# Patient Record
Sex: Female | Born: 1965 | Race: White | Hispanic: No
Health system: Western US, Academic
[De-identification: ages and names within clinical notes are randomized; demographics above are authoritative.]

## PROBLEM LIST (undated history)

## (undated) ENCOUNTER — Ambulatory Visit (HOSPITAL_COMMUNITY): Admission: EM | Payer: Medicare Other

## (undated) DIAGNOSIS — A609 Anogenital herpesviral infection, unspecified: Secondary | ICD-10-CM

## (undated) DIAGNOSIS — R87610 Atypical squamous cells of undetermined significance on cytologic smear of cervix (ASC-US): Secondary | ICD-10-CM

## (undated) DIAGNOSIS — IMO0002 Reserved for concepts with insufficient information to code with codable children: Secondary | ICD-10-CM

## (undated) DIAGNOSIS — M1990 Unspecified osteoarthritis, unspecified site: Secondary | ICD-10-CM

## (undated) DIAGNOSIS — B977 Papillomavirus as the cause of diseases classified elsewhere: Secondary | ICD-10-CM

## (undated) DIAGNOSIS — M4800 Spinal stenosis, site unspecified: Secondary | ICD-10-CM

## (undated) DIAGNOSIS — M069 Rheumatoid arthritis, unspecified: Secondary | ICD-10-CM

## (undated) DIAGNOSIS — M542 Cervicalgia: Secondary | ICD-10-CM

## (undated) DIAGNOSIS — R32 Unspecified urinary incontinence: Secondary | ICD-10-CM

## (undated) DIAGNOSIS — M2352 Chronic instability of knee, left knee: Secondary | ICD-10-CM

## (undated) DIAGNOSIS — K5790 Diverticulosis of intestine, part unspecified, without perforation or abscess without bleeding: Secondary | ICD-10-CM

## (undated) DIAGNOSIS — T148XXA Other injury of unspecified body region, initial encounter: Secondary | ICD-10-CM

## (undated) DIAGNOSIS — Z8739 Personal history of other diseases of the musculoskeletal system and connective tissue: Secondary | ICD-10-CM

## (undated) DIAGNOSIS — A6000 Herpesviral infection of urogenital system, unspecified: Secondary | ICD-10-CM

## (undated) DIAGNOSIS — M5090 Cervical disc disorder, unspecified, unspecified cervical region: Secondary | ICD-10-CM

## (undated) DIAGNOSIS — K089 Disorder of teeth and supporting structures, unspecified: Secondary | ICD-10-CM

## (undated) DIAGNOSIS — J302 Other seasonal allergic rhinitis: Secondary | ICD-10-CM

## (undated) DIAGNOSIS — J45909 Unspecified asthma, uncomplicated: Secondary | ICD-10-CM

## (undated) DIAGNOSIS — K529 Noninfective gastroenteritis and colitis, unspecified: Secondary | ICD-10-CM

## (undated) DIAGNOSIS — M792 Neuralgia and neuritis, unspecified: Secondary | ICD-10-CM

## (undated) DIAGNOSIS — T753XXA Motion sickness, initial encounter: Secondary | ICD-10-CM

## (undated) HISTORY — PX: SPINAL FUSION: SHX5113

## (undated) HISTORY — DX: Neuralgia and neuritis, unspecified: M79.2

## (undated) HISTORY — PX: SURGICAL HX OTHER: 99

## (undated) HISTORY — DX: Rheumatoid arthritis, unspecified: M06.9

## (undated) HISTORY — PX: PR ADENOIDECTOMY PRIMARY <AGE 12: 42830

## (undated) HISTORY — DX: Cervical disc disorder, unspecified, unspecified cervical region: M50.90

## (undated) HISTORY — DX: Cervicalgia: M54.2

## (undated) HISTORY — PX: PR UNLISTED PROCEDURE FEMUR/KNEE: 27599

## (undated) HISTORY — DX: Other injury of unspecified body region, initial encounter: T14.8XXA

## (undated) HISTORY — PX: JOINT REPLACEMENT: SHX5216

## (undated) HISTORY — PX: PR TONSILLECTOMY ONE-HALF <AGE 12: 42825

## (undated) HISTORY — DX: Diverticulosis of intestine, part unspecified, without perforation or abscess without bleeding: K57.90

## (undated) HISTORY — DX: Chronic instability of knee, left knee: M23.52

## (undated) HISTORY — PX: EPIDURAL BLOCK INJECTION: SHX5085

## (undated) HISTORY — PX: KNEE ARTHROPLASTY: SHX992

## (undated) HISTORY — DX: Noninfective gastroenteritis and colitis, unspecified: K52.9

## (undated) HISTORY — DX: Motion sickness, initial encounter: T75.3XXA

## (undated) HISTORY — DX: Other seasonal allergic rhinitis: J30.2

## (undated) HISTORY — PX: PR UNLISTED PROCEDURE NECK/THORAX: 21899

## (undated) HISTORY — DX: Unspecified urinary incontinence: R32

## (undated) HISTORY — DX: Disorder of teeth and supporting structures, unspecified: K08.9

## (undated) HISTORY — PX: PR UNLISTED PROCEDURE PELVIS/HIP JOINT: 27299

## (undated) HISTORY — DX: Personal history of other diseases of the musculoskeletal system and connective tissue: Z87.39

## (undated) HISTORY — PX: PR UNLISTED PROCEDURE FOOT/TOES: 28899

## (undated) HISTORY — PX: LAMINECTOMY: SHX5029

## (undated) HISTORY — PX: CERVICAL SPINE SURGERY: SHX589

## (undated) HISTORY — DX: Reserved for concepts with insufficient information to code with codable children: IMO0002

## (undated) HISTORY — DX: Anogenital herpesviral infection, unspecified: A60.9

## (undated) HISTORY — DX: Papillomavirus as the cause of diseases classified elsewhere: B97.7

## (undated) HISTORY — DX: Atypical squamous cells of undetermined significance on cytologic smear of cervix (ASC-US): R87.610

## (undated) HISTORY — PX: OTHER SURGICAL HISTORY: SHX169

## (undated) HISTORY — DX: Spinal stenosis, site unspecified: M48.00

## (undated) HISTORY — PX: TONSILLECTOMY: SUR1361

## (undated) MED ORDER — TIZANIDINE HCL 2 MG OR TABS
2.0000 mg | ORAL_TABLET | Freq: Three times a day (TID) | ORAL | 0 refills | Status: AC
Start: 2023-04-08 — End: ?

## (undated) MED ORDER — OXYCODONE HCL 5 MG OR TABS
5.0000 mg | ORAL_TABLET | Freq: Four times a day (QID) | ORAL | 0 refills | Status: AC | PRN
Start: 2023-04-08 — End: 2023-04-18

## (undated) MED ORDER — DULOXETINE HCL 30 MG OR CPEP
DELAYED_RELEASE_CAPSULE | ORAL | 0 refills | Status: AC
Start: 2023-01-04 — End: ?

## (undated) MED ORDER — LEFLUNOMIDE 20 MG OR TABS
ORAL_TABLET | ORAL | 3 refills | Status: AC
Start: 2023-04-21 — End: ?

## (undated) MED FILL — Tranexamic Acid-Sodium Chloride IV Soln 1000 MG/100ML-0.7%: INTRAVENOUS | Qty: 100 | Status: AC

---

## 1998-03-27 ENCOUNTER — Encounter: Admission: RE | Admit: 1998-03-27 | Discharge: 1998-06-25 | Payer: Self-pay | Admitting: Gynecology

## 1998-09-02 ENCOUNTER — Inpatient Hospital Stay (HOSPITAL_COMMUNITY): Admission: AD | Admit: 1998-09-02 | Discharge: 1998-09-02 | Payer: Self-pay | Admitting: Gynecology

## 1998-09-03 ENCOUNTER — Inpatient Hospital Stay (HOSPITAL_COMMUNITY): Admission: AD | Admit: 1998-09-03 | Discharge: 1998-09-05 | Payer: Self-pay | Admitting: Gynecology

## 1998-10-04 ENCOUNTER — Other Ambulatory Visit: Admission: RE | Admit: 1998-10-04 | Discharge: 1998-10-04 | Payer: Self-pay | Admitting: Gynecology

## 1999-10-05 ENCOUNTER — Other Ambulatory Visit: Admission: RE | Admit: 1999-10-05 | Discharge: 1999-10-05 | Payer: Self-pay | Admitting: Gynecology

## 2006-03-22 ENCOUNTER — Other Ambulatory Visit: Admission: RE | Admit: 2006-03-22 | Discharge: 2006-03-22 | Payer: Self-pay | Admitting: Family Medicine

## 2007-03-29 ENCOUNTER — Other Ambulatory Visit: Admission: RE | Admit: 2007-03-29 | Discharge: 2007-03-29 | Payer: Self-pay | Admitting: Family Medicine

## 2008-04-16 ENCOUNTER — Other Ambulatory Visit: Admission: RE | Admit: 2008-04-16 | Discharge: 2008-04-16 | Payer: Self-pay | Admitting: Family Medicine

## 2008-09-18 ENCOUNTER — Ambulatory Visit (HOSPITAL_BASED_OUTPATIENT_CLINIC_OR_DEPARTMENT_OTHER): Admission: RE | Admit: 2008-09-18 | Discharge: 2008-09-19 | Payer: Self-pay | Admitting: Orthopedic Surgery

## 2008-11-17 ENCOUNTER — Emergency Department (HOSPITAL_COMMUNITY): Admission: EM | Admit: 2008-11-17 | Discharge: 2008-11-17 | Payer: Self-pay | Admitting: Emergency Medicine

## 2011-02-02 LAB — BASIC METABOLIC PANEL
BUN: 9 mg/dL (ref 6–23)
CO2: 26 meq/L (ref 19–32)
Calcium: 9 mg/dL (ref 8.4–10.5)
Chloride: 105 meq/L (ref 96–112)
Creatinine, Ser: 0.64 mg/dL (ref 0.4–1.2)
GFR calc Af Amer: >60 mL/min (ref >60)
GFR calc non Af Amer: >60 mL/min (ref >60)
Glucose, Bld: 80 mg/dL (ref 70–99)
Potassium: 3.9 mEq/L (ref 3.5–5.1)
Sodium: 139 meq/L (ref 135–145)

## 2011-02-02 LAB — DIFFERENTIAL
Basophils Absolute: 0 10*3/uL (ref 0.0–0.1)
Basophils Relative: 0 % (ref 0–1)
Eosinophils Absolute: 0.3 10*3/uL (ref 0.0–0.7)
Eosinophils Relative: 3 % (ref 0–5)
Lymphocytes Relative: 24 % (ref 12–46)
Lymphs Abs: 2.6 10*3/uL (ref 0.7–4.0)
Monocytes Absolute: 0.5 10*3/uL (ref 0.1–1.0)
Monocytes Relative: 4 % (ref 3–12)
Neutro Abs: 7.3 10*3/uL (ref 1.7–7.7)
Neutrophils Relative %: 68 % (ref 43–77)

## 2011-02-02 LAB — CBC
HCT: 44.5 % (ref 36.0–46.0)
Hemoglobin: 14.6 g/dL (ref 12.0–15.0)
MCHC: 32.9 g/dL (ref 30.0–36.0)
MCV: 94.2 fL (ref 78.0–100.0)
Platelets: 295 10*3/uL (ref 150–400)
RBC: 4.72 MIL/uL (ref 3.87–5.11)
RDW: 13.2 % (ref 11.5–15.5)
WBC: 10.7 10*3/uL — ABNORMAL HIGH (ref 4.0–10.5)

## 2011-03-03 NOTE — Op Note (Signed)
Caitlin Wagner, Caitlin Wagner              ACCOUNT NO.:  [id number]   MEDICAL RECORD NO.:  [id number]          PATIENT TYPE:  AMB   LOCATION:  DSC                          FACILITY:  [facility]   PHYSICIAN:  Leonides Grills, M.D.     DATE OF BIRTH:  [birth year]   DATE OF PROCEDURE:  09/18/2008  DATE OF DISCHARGE:                               OPERATIVE REPORT   PREOPERATIVE DIAGNOSES:  1. Right hypermobile first ray with rheumatoid arthritis.  2. Right hallux valgus.  3. Right bunion.  4. Right tight gastroc.  5. Right second through fifth metatarsalgia.  6. Right second through fifth hammertoes.   POSTOPERATIVE DIAGNOSES:  1. Right hypermobile first ray with rheumatoid arthritis.  2. Right hallux valgus.  3. Right bunion.  4. Right tight gastroc.  5. Right second through fifth metatarsalgia.  6. Right second through fifth hammertoes.   OPERATION:  1. Right first tarsometatarsal joint fusion with osteotomy.  2. Right local bone graft.  3. Right-modified McBride bunionectomy.  4. Right great toe digital nerve neurolysis.  5. Stress x-rays right foot.  6. Right gastroc slide.  7. Right varus fifth metatarsal osteotomy.  8. Right second through fourth Weil cap shortening osteotomies.  9. Right second through fifth toes metatarsophalangeal joint dorsal      capsulotomies with collateral release.  10.Right second toe flexor digitorum longus to proximal phalanx tendon      transfer.  11.Right second through fourth toes extensor digitorum brevis to      extensor digitorum longus tendon transfer.  12.Right fifth toe extensor digitorum longus Z-lengthening.   ANESTHESIA:  General.   SURGEON:  Leonides Grills, MD   ASSISTANT:  Richardean Canal, PA-C   ESTIMATED BLOOD LOSS:  Minimal.   TOURNIQUET TIME:  2 hours.   COMPLICATIONS:  None.   DISPOSITION:  Stable to PR.   INDICATIONS:  This is a 45 year old female who had rheumatoid arthritis  with the above painful deformity and pathology.  She  was consented to  the above procedure.  All risks of infection, neurovascular injury,  nonunion, malunion, hardware rotation, hardware failure, persistent  pain, worsening pain, prolonged recovery, stiffness, arthritis,  recurrence of hallux valgus, development of hallux varus, cock-up toe  deformity of lesser toes, and recurrence of dislocation of the MTP  joints as well as the hammertoe deformity and possibility of metatarsal  head resection in the future were all explained.  Questions were  encouraged and answered.   OPERATION:  The patient was brought to the operating room and placed in  supine position.  After adequate general endotracheal anesthesia  administered as well as Ancef 1 g IV piggyback, right lower extremity  was then prepped and draped in the sterile manner over proximally thigh  tourniquet.  Limb was gravity exsanguinated.  Tourniquet was elevated to  290 mmHg.  A longitudinal incision midline over the medial aspect the  right great toe MTP joint was then made.  Dissection was carried down  through the skin.  Hemostasis was obtained.  Great toe digital nerve and  dorsal medial nerve was carefully dissected out.  A formal digital nerve  neurolysis was then performed.  Once this retracted out of harm's way, L-  shaped capsulotomy was then made.  Simple bunionectomy was then  performed with a sagittal saw.  Rocky Link Johnson's ridge was then rounded off  with a rongeur and area was copiously irrigated with normal saline.  Lateral capsule was then released with curved Beaver blade protecting  the cartilaginous surface with a Therapist, nutritional.  This had outstanding  release of the tight capsule.  We then made a longitudinal incision over  the dorsal aspect of the right first TMP joint midline between EHL and  EHB.  Dissection was carried down through the skin.  Hemostasis was  obtained.  EHL and EHB tendons were protected within their sheaths and  interval between these tendons was  then developed.  The superficial  peroneal nerve was identified and careful superficial peroneal nerve  neurolysis was then performed.  This was also retracted out of harm's  way throughout the procedure.  First TMT joint was then entered.  Then  with a sagittal saw, a closing wedge base of the medial cuneiform  osteotomy was then made using a sagittal saw.  This was then placed on  the back table as local bone graft.  The base of first metatarsal was  also osteotomized creating a flat surface to appose the medial cuneiform  surface.  The remaining cartilage was removed with the curved 0.25-inch  osteotome as well as curette.  Multiple 2-mm drill holes were placed on  either side of the joint.  Then with a 2-point reduction clamp, we then  reduced the first TMT joint, visualized this under C-arm guidance to be  in excellent position.  We then provisionally fixed this with a 2-mm K-  wire from the medial cuneiform to the base of first metatarsal extending  sheathing laterally.  We then used a bur to create a notch into the base  of first metatarsal about 2 cm distal to the first TMP joint.  We then  placed a 3.5-mm fully-threaded cortical lag screw using a 3.5 followed  by 2.5-mm drill hole respectively.  This had excellent purchase and  maintenance of the compression.  With the reduction clamp still in  place, K-wire was removed and this with this was replaced with a 3.5-mm  fully-threaded cortical lag screw using a 3.5 and 2.5-mm hole  respectively.  This had excellent purchase and compression across the  fusion site.  Stress x-rays were obtained in the AP and lateral planes  that showed no gross motion of fixation, proper position, and excellent  alignment as well.  We then went back to the great toe MTP joint,  completed the modified McBride bunionectomy by reconstructing the medial  capsule.  This was very loose.  This was reconstructed using 2-0 Vicryl  and this had an outstanding  repair.  We were able to reduce the  sesamoids as well.  We then made a longitudinal incision over the fifth  metatarsal.  Dissection was carried down through the skin.  Hemostasis  was obtained.  We then carefully dissected the soft tissues around the  shaft itself and then an oblique osteotomy was then created in the  shaft.  The metatarsal was moved into a varus position and then fixed  with two 2.0 mm fully threaded cortical lag screws using a 2.0 and 1.5-  mm hole respectively.  This had excellent purchase and maintenance of  the position.  We trimmed  off the redundant bone both medially and  laterally as well.  We then made a longitudinal incision over the dorsal  aspect of the second toe.  Dissection was carried down through the skin.  Hemostasis was obtained.  EDB and EDL tendons were identified.  The EDL  tendon was tenotomized proximally and medially and the brevis distal  lateral was retracted out of harm's way for later transfer.  The distal  aspect of proximal phalanx was then skeletonized.  Then the head was  removed with a rongeur followed by a bone cutter.  This cut was made  perpendicular to the long axis of the proximal phalanx.  A dorsal  capsulotomy and collateral release were then performed of the MTP joint,  protecting the soft tissues both medially and laterally.  Once this was  done, we then plantarflexed the toe through the MTP joint and then  performed a Weil cup metatarsal osteotomy in a plane that was parallel  to the plantar aspect of the foot.  This was then shortened about 6 mm  or so and then fixed with a 1.5-mm fully-threaded cortical set screw  using 1.1-mm drill hole respectively.  This had excellent purchase and  maintenance of the reduction.  The redundant bone ledge dorsally was  trimmed off with a rongeur and the area was copiously irrigated with  normal saline.  Bone wax was applied to the exposed bone surfaces.  The  second MTP joint was the only  joint that was completely dislocated.  The  remaining joints were not dislocated and we then made a longitudinal  incision through the plantar plate.  FPL tendon was then identified and  tenotomized distal as possible.  We then placed a drill hole through the  proximal phalanx using a 2.5 followed by a 3.5-mm drill hole.  We then  placed the tendon through the drill from plantar to dorsal transferring  the FPL tendon to the proximal phalanx.  This was then repaired with 4-0  PDS stitch.  We then placed a 0.045 double-ended trocar K-wire antegrade  through the middle and distal phalanx, reduced the PIP joint and with  tension on the FPL tendon through the drill hole and the MTP joint held  in reduced position.  The K-wire was fired retrograde across the MTP  joint.  This held the toe in an excellent position and had excellent  tension on the FPL tendon.  We then transferred the EDB to EDL dorsally  using 4-0 PDS stitch and sewed this to the stump of the FPL tendon as  well.  This had an Conservation officer, historic buildings. We did the exact same procedure  for the third and fourth toes respectively except due to the fact that  the toes were located and after the Weil osteotomy, the shock test  revealed that the toe was stable.  We did not perform an FPL to proximal  phalanx tendon transfer.  For the fifth toe again through a separate  incision dorsally, we did the exact same procedure as the third and  fourth toes as well except we did not perform an EDB to EDL tendon  transfer.  We did EDL Z-lengthening and that was repaired with 4-0 PDS  stitch.  Local bone graft that was obtained throughout the procedure  from the metatarsal heads as well as from drill bits and the osteotomies  was packed into the first TMP joint using a stress and strain relieving  technique as described by Perren.  Tourniquet was deflated prior to this  finishing the case and hemostasis was obtained.  There was no pulsatile  bleeding.   Toes all pinked up nicely.  Skin-relieving incisions were  made on either side of the K-wire.  K-wires were bent, cut, and capped.  Final stress x-rays in the AP and lateral planes showed no gross motion  of fixation, proper position, and excellent alignment as well.  All  screws were of proper length.  We copiously irrigated the wounds with  normal saline.  Subcu was closed with 4-0 PDS.  Skin was closed with 4-0  nylon over all wounds.  Sterile dressing was applied.  Roger Mann  dressing was applied.  Modified Jones dressing was applied.  The patient  was stable to the PR.      Leonides Grills, M.D.  Electronically Signed     PB/MEDQ  D:  09/18/2008  T:  09/19/2008  Job:  [id number]

## 2011-03-03 NOTE — Op Note (Signed)
NAMEDEMITA, TOBIA              ACCOUNT NO.:  [id number]   MEDICAL RECORD NO.:  [id number]          PATIENT TYPE:  AMB   LOCATION:  DSC                          FACILITY:  [facility]   PHYSICIAN:  Leonides Grills, M.D.     DATE OF BIRTH:  1966/01/07   DATE OF PROCEDURE:  09/18/2008  DATE OF DISCHARGE:                               OPERATIVE REPORT   ADDENDUM   OPERATIVE NOTE:  We performed a gastroc slide.  We made a longitudinal  incision on the medial aspect of the gastrocnemius muscle tendinous  junction.  Dissection was carried down through the skin.  Hemostasis was  obtained.  Fascia was opened in line with the incision.  Conjoined  region was then developed between the gastroc and soleus musculatures.  A sural nerve was identified posteriorly and protected throughout the  case.  Gastrocnemius was then released with curved Mayo scissors.  This  had an excellent release, tight gastroc.  The area was copiously  irrigated with normal saline.  Subcu was closed with 3-0 Vicryl, skin  was closed with 4-0 Monocryl subcuticular stitch.  Steri-Strips were  applied.      Leonides Grills, M.D.  Electronically Signed     PB/MEDQ  D:  09/18/2008  T:  09/19/2008  Job:  [id number]

## 2011-07-24 LAB — POCT HEMOGLOBIN-HEMACUE: Hemoglobin: 15.3 g/dL — ABNORMAL HIGH (ref 12.0–15.0)

## 2012-03-19 DIAGNOSIS — B977 Papillomavirus as the cause of diseases classified elsewhere: Secondary | ICD-10-CM

## 2012-03-19 HISTORY — DX: Papillomavirus as the cause of diseases classified elsewhere: B97.7

## 2012-04-01 ENCOUNTER — Ambulatory Visit: Payer: Self-pay | Admitting: Gynecology

## 2012-04-12 ENCOUNTER — Encounter: Payer: Self-pay | Admitting: Gynecology

## 2012-04-12 ENCOUNTER — Other Ambulatory Visit (HOSPITAL_COMMUNITY)
Admission: RE | Admit: 2012-04-12 | Discharge: 2012-04-12 | Disposition: A | Discharge status: Home / Self Care | Payer: Medicare Other | Source: Ambulatory Visit | Attending: Gynecology | Admitting: Gynecology

## 2012-04-12 ENCOUNTER — Ambulatory Visit (INDEPENDENT_AMBULATORY_CARE_PROVIDER_SITE_OTHER): Payer: Medicare Other | Admitting: Gynecology

## 2012-04-12 VITALS — BP 116/60 | Ht 61.75 in | Wt 157.0 lb

## 2012-04-12 DIAGNOSIS — R87810 Cervical high risk human papillomavirus (HPV) DNA test positive: Secondary | ICD-10-CM | POA: Insufficient documentation

## 2012-04-12 DIAGNOSIS — R350 Frequency of micturition: Secondary | ICD-10-CM

## 2012-04-12 DIAGNOSIS — Z124 Encounter for screening for malignant neoplasm of cervix: Secondary | ICD-10-CM

## 2012-04-12 DIAGNOSIS — Z309 Encounter for contraceptive management, unspecified: Secondary | ICD-10-CM

## 2012-04-12 DIAGNOSIS — Z20828 Contact with and (suspected) exposure to other viral communicable diseases: Secondary | ICD-10-CM

## 2012-04-12 DIAGNOSIS — M069 Rheumatoid arthritis, unspecified: Secondary | ICD-10-CM | POA: Insufficient documentation

## 2012-04-12 LAB — URINALYSIS W MICROSCOPIC + REFLEX CULTURE
Bilirubin Urine: NEGATIVE
Glucose, UA: NEGATIVE mg/dL
Hgb urine dipstick: NEGATIVE
Ketones, ur: NEGATIVE mg/dL
Leukocytes, UA: NEGATIVE
Nitrite: NEGATIVE
Protein, ur: NEGATIVE mg/dL
Specific Gravity, Urine: <1.005 — ABNORMAL LOW (ref 1.005–1.030)
Urobilinogen, UA: 0.2 mg/dL (ref 0.0–1.0)
pH: 6 (ref 5.0–8.0)

## 2012-04-12 LAB — RPR

## 2012-04-12 NOTE — Patient Instructions (Signed)
Office will contact you with STD results. Follow up in one year for annual exam.

## 2012-04-12 NOTE — Progress Notes (Signed)
Caitlin Wagner [birth year] [id number]        46 y.o. G2 P2  Has not been in for a number of years being followed for rheumatoid arthritis. Several issues noted below.  Past medical history,surgical history, medications, allergies, family history and social history were all reviewed and documented in the EPIC chart. ROS:  Was performed and pertinent positives and negatives are included in the history.  Exam: Sherrilyn Rist assistant present Filed Vitals:   04/12/12 1049  BP: 116/60   General appearance  Normal Skin grossly normal Hands/feet with rheumatoid changes Head/Neck normal with no cervical or supraclavicular adenopathy thyroid normal Lungs  clear Cardiac RR, without RMG Abdominal  soft, nontender, without masses, organomegaly or hernia Breasts  examined lying and sitting without masses, retractions, discharge or axillary adenopathy. Pelvic  Ext/BUS/vagina  normal   Cervix  normal GC/Chlamydia, Pap/HPV  Uterus  anteverted, normal size, shape and contour, midline and mobile nontender   Adnexa  Without masses or tenderness    Anus and perineum  normal   Rectovaginal  normal sphincter tone without palpated masses or tenderness.    Assessment/Plan:  46 y.o. G2 P2 female regular menses.    1. STD screening. Patient relates prior relationship unfaithful and requests STD screening. GC/Chlamydia, HIV, RPR, hepatitis B, hepatitis C done. HSV/HPV issues reviewed and symptom reporting discussed. 2. Contraception. Patient not sexually active. She is 86 and smokes. Options to include barrier, progesterone only, IUD, sterilization reviewed.  Patient will follow up if she wants to pursue the above. 3. Pap smear. Pap/HPV done. No history of abnormal Pap smears but has not had one in a number of years. 4. Mammography. Recommended screening mammogram as she's never had one. SBE monthly reviewed. 5. Frequency. Patient has chronic frequency over the past year or so. No other symptoms. Urinalysis is  negative today as is her exam. I reviewed issues with caffeine/carbonated beverages that she does have a high intake. Possibilities for treatment such as OAB medications. We'll try decrease caffeine soda at present. 6. Health maintenance.  No other lab work was done as she reports having routine lab work done through her primary physician's office.    [name] MD, 11:25 AM 04/12/2012

## 2012-04-13 LAB — GC/CHLAMYDIA PROBE AMP, GENITAL
Chlamydia, DNA Probe: NEGATIVE
GC Probe Amp, Genital: NEGATIVE

## 2012-04-13 LAB — HEPATITIS B SURFACE ANTIGEN: Hepatitis B Surface Ag: NEGATIVE

## 2012-04-13 LAB — HIV ANTIBODY (ROUTINE TESTING W REFLEX): HIV: NONREACTIVE

## 2012-04-13 LAB — HEPATITIS C ANTIBODY: HCV Ab: NEGATIVE

## 2012-04-15 ENCOUNTER — Encounter: Payer: Self-pay | Admitting: Gynecology

## 2012-04-19 ENCOUNTER — Ambulatory Visit (INDEPENDENT_AMBULATORY_CARE_PROVIDER_SITE_OTHER): Payer: Medicare Other | Admitting: Gynecology

## 2012-04-19 ENCOUNTER — Encounter: Payer: Self-pay | Admitting: Gynecology

## 2012-04-19 DIAGNOSIS — R87612 Low grade squamous intraepithelial lesion on cytologic smear of cervix (LGSIL): Secondary | ICD-10-CM

## 2012-04-19 DIAGNOSIS — IMO0002 Reserved for concepts with insufficient information to code with codable children: Secondary | ICD-10-CM

## 2012-04-19 NOTE — Patient Instructions (Signed)
Office will call you with the biopsy results 

## 2012-04-19 NOTE — Progress Notes (Signed)
Patient ID: Caitlin Wagner, female   DOB: 03-Jan-1966, 46 y.o.   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 Biopsy of both areas taken and ECC performed. Patient tolerated well.   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 Acetowhite change at 12:00 off transformation zone and 6:00 at transformation zone was biopsied. ECC performed. I had a lengthy discussion with the patient about dysplasia, high-grade/low-grade, progression/regression in HPV Association. Patient will follow up for biopsy results. If low-grade and plan expected management with Pap/HPV cotesting one year.  If otherwise then we'll triage based on results.

## 2012-04-25 ENCOUNTER — Telehealth: Payer: Self-pay | Admitting: Gynecology

## 2012-04-25 NOTE — Telephone Encounter (Signed)
Tell patient that the biopsies are consistent with low grade changes. Recommend repeat Pap smear/HPV in 1 year.

## 2012-04-27 NOTE — Telephone Encounter (Signed)
Left message for pt to call.

## 2012-04-27 NOTE — Telephone Encounter (Signed)
Pt informed with the below note. 

## 2013-05-17 ENCOUNTER — Encounter: Payer: Self-pay | Admitting: Gynecology

## 2013-06-05 ENCOUNTER — Ambulatory Visit (INDEPENDENT_AMBULATORY_CARE_PROVIDER_SITE_OTHER): Payer: Medicare Other | Admitting: Gynecology

## 2013-06-05 ENCOUNTER — Encounter: Payer: Self-pay | Admitting: Gynecology

## 2013-06-05 ENCOUNTER — Other Ambulatory Visit (HOSPITAL_COMMUNITY)
Admission: RE | Admit: 2013-06-05 | Discharge: 2013-06-05 | Disposition: A | Discharge status: Home / Self Care | Payer: Medicare Other | Source: Ambulatory Visit | Attending: Gynecology | Admitting: Gynecology

## 2013-06-05 VITALS — BP 120/74 | Ht 61.0 in | Wt 160.0 lb

## 2013-06-05 DIAGNOSIS — R6889 Other general symptoms and signs: Secondary | ICD-10-CM

## 2013-06-05 DIAGNOSIS — IMO0002 Reserved for concepts with insufficient information to code with codable children: Secondary | ICD-10-CM

## 2013-06-05 DIAGNOSIS — Z1151 Encounter for screening for human papillomavirus (HPV): Secondary | ICD-10-CM | POA: Insufficient documentation

## 2013-06-05 DIAGNOSIS — Z309 Encounter for contraceptive management, unspecified: Secondary | ICD-10-CM

## 2013-06-05 DIAGNOSIS — Z01419 Encounter for gynecological examination (general) (routine) without abnormal findings: Secondary | ICD-10-CM | POA: Insufficient documentation

## 2013-06-05 DIAGNOSIS — Z9189 Other specified personal risk factors, not elsewhere classified: Secondary | ICD-10-CM

## 2013-06-05 NOTE — Progress Notes (Signed)
Caitlin Wagner June 21, 1966 [id number]        47 y.o.  [id number] for followup exam.  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 Colposcopic biopsy confirmed LGSIL and we planned expectant management.  Past medical history,surgical history, medications, allergies, family history and social history were all reviewed and documented in the EPIC chart.  ROS:  Performed and pertinent positives and negatives are included in the history, assessment and plan .  Exam: Kim assistant Filed Vitals:   06/05/13 1520  BP: 120/74  Height: 5\' 1"  (1.549 m)  Weight: 160 lb (72.576 kg)   General appearance  Normal Skin grossly normal Head/Neck normal with no cervical or supraclavicular adenopathy thyroid normal Lungs  clear Cardiac RR, without RMG Abdominal  soft, nontender, without masses, organomegaly or hernia Breasts  examined lying and sitting without masses, retractions, discharge or axillary adenopathy. Pelvic  Ext/BUS/vagina  normal   Cervix  normal Pap/HPV  Uterus  anteverted, normal size, shape and contour, midline and mobile nontender   Adnexa  Without masses or tenderness    Anus and perineum  normal   Rectovaginal  normal sphincter tone without palpated masses or tenderness.    Assessment/Plan:  47 y.o. [id number] female for followup exam.   1. 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 Pap/HPV today. We'll [name] based upon results. 2. Contraception. Patient's leaning towards Mirena IUD. We discussed contraceptive options. She is 47 and smokes and I do not think estrogen containing products wise. Patient will followup for IUD. She's currently not sexually active and has not been since her visit last year. 3. Mammography never. I strongly recommended screening mammography. I discussed the benefits of early detection. The patient agrees to schedule. SBE monthly reviewed. 4. Health  maintenance. Actively being followed for rheumatoid arthritis. No lab work done today as it is all done through her other physician's offices. Followup with contraceptive decision otherwise for Pap smear results.  Note: This document was prepared with digital dictation and possible smart phrase technology. Any transcriptional errors that result from this process are unintentional.   [name] MD, 3:51 PM 06/05/2013

## 2013-06-05 NOTE — Patient Instructions (Signed)
Call to Schedule your mammogram  Facilities in [place]: 1)  [facility], [address] Phone: [phone] 2)  [facility], [address] Phone: [phone] 3)  Dr. Bertrand at [facility]  [address] Phone: [phone]     Mammogram A mammogram is an X-ray test to find changes in a woman's breast. You should get a mammogram if:  You are 40 years of age or older  You have risk factors.   Your doctor recommends that you have one.  BEFORE THE TEST  Do not schedule the test the week before your period, especially if your breasts are sore during this time.  On the day of your mammogram:  Wash your breasts and armpits well. After washing, do not put on any deodorant or talcum powder on until after your test.   Eat and drink as you usually do.   Take your medicines as usual.   If you are diabetic and take insulin, make sure you:   Eat before coming for your test.   Take your insulin as usual.   If you cannot keep your appointment, call before the appointment to cancel. Schedule another appointment.  TEST  You will need to undress from the waist up. You will put on a hospital gown.   Your breast will be put on the mammogram machine, and it will press firmly on your breast with a piece of plastic called a compression paddle. This will make your breast flatter so that the machine can X-ray all parts of your breast.   Both breasts will be X-rayed. Each breast will be X-rayed from above and from the side. An X-ray might need to be taken again if the picture is not good enough.   The mammogram will last about 15 to 30 minutes.  AFTER THE TEST Finding out the results of your test Ask when your test results will be ready. Make sure you get your test results.  Document Released: 01/01/2009 Document Revised: 09/24/2011 Document Reviewed: 01/01/2009 [employer] Patient  Information 2012 [employer].   

## 2013-06-05 NOTE — Addendum Note (Signed)
Addended by: Dayna Barker on: 06/05/2013 04:19 PM   Modules accepted: Orders

## 2014-08-20 ENCOUNTER — Encounter: Payer: Self-pay | Admitting: Gynecology

## 2015-08-01 ENCOUNTER — Encounter: Payer: Self-pay | Admitting: Gynecology

## 2015-08-01 ENCOUNTER — Ambulatory Visit (INDEPENDENT_AMBULATORY_CARE_PROVIDER_SITE_OTHER): Payer: Medicare Other | Admitting: Gynecology

## 2015-08-01 VITALS — BP 116/76

## 2015-08-01 DIAGNOSIS — Z23 Encounter for immunization: Secondary | ICD-10-CM | POA: Diagnosis not present

## 2015-08-01 DIAGNOSIS — N760 Acute vaginitis: Secondary | ICD-10-CM

## 2015-08-01 DIAGNOSIS — B9689 Other specified bacterial agents as the cause of diseases classified elsewhere: Secondary | ICD-10-CM

## 2015-08-01 DIAGNOSIS — A499 Bacterial infection, unspecified: Secondary | ICD-10-CM

## 2015-08-01 LAB — WET PREP FOR TRICH, YEAST, CLUE
Trich, Wet Prep: NONE SEEN
Yeast Wet Prep HPF POC: NONE SEEN

## 2015-08-01 MED ORDER — CLINDAMYCIN PHOSPHATE 2 % VA CREA: 1.0000 | TOPICAL_CREAM | Freq: Every day | VAGINAL | Status: DC

## 2015-08-01 NOTE — Progress Notes (Signed)
YANICE MAQUEDA 1966-04-21 [id number]        49 y.o.  [id number] Presents complaining of 1-2 weeks of vaginal irritation and discharge. No real odor or significant itching. No dysuria urgency frequency. Has annual exam scheduled in December. History of LGSIL with positive HPV screen in the past with most recent Pap smear 2014 negative cytology and negative HPV screening  Past medical history,surgical history, problem list, medications, allergies, family history and social history were all reviewed and documented in the EPIC chart.  Directed ROS with pertinent positives and negatives documented in the history of present illness/assessment and plan.  Exam: Kim assistant Filed Vitals:   08/01/15 1434  BP: 116/76   General appearance:  Normal  external BUS vagina with white discharge. Cervix grossly normal. Uterus normal size midline mobile nontender. Adnexa without masses or tenderness.  Assessment/Plan:  49 y.o. [id number] with above history and exam.wet prep is suggestive of bacterial vaginosis. Will treat with Cleocin vaginal cream nightly 7 days. Follow up if symptoms persist, worsen or recur. Follow up at the scheduled annual exam appointment in December.    [name] MD, 2:57 PM 08/01/2015

## 2015-08-01 NOTE — Addendum Note (Signed)
Addended by: Dayna Barker on: 08/01/2015 03:41 PM   Modules accepted: Orders

## 2015-08-01 NOTE — Addendum Note (Signed)
Addended by: Dayna Barker on: 08/01/2015 03:13 PM   Modules accepted: Orders

## 2015-08-01 NOTE — Patient Instructions (Signed)
Use the vaginal cream nightly for 7 nights. Follow up if your symptoms persist, worsen or recur.  Follow up in December at your scheduled annual appointment exam.

## 2015-09-27 ENCOUNTER — Encounter: Payer: Self-pay | Admitting: Gynecology

## 2015-09-27 ENCOUNTER — Ambulatory Visit (INDEPENDENT_AMBULATORY_CARE_PROVIDER_SITE_OTHER): Payer: Medicare Other | Admitting: Gynecology

## 2015-09-27 VITALS — BP 118/76

## 2015-09-27 DIAGNOSIS — N760 Acute vaginitis: Secondary | ICD-10-CM

## 2015-09-27 LAB — WET PREP FOR TRICH, YEAST, CLUE
Clue Cells Wet Prep HPF POC: NONE SEEN
Trich, Wet Prep: NONE SEEN
Yeast Wet Prep HPF POC: NONE SEEN

## 2015-09-27 MED ORDER — FLUCONAZOLE 150 MG PO TABS: 150.0000 mg | ORAL_TABLET | Freq: Once | ORAL | Status: DC

## 2015-09-27 MED ORDER — METRONIDAZOLE 500 MG PO TABS: 500.0000 mg | ORAL_TABLET | Freq: Two times a day (BID) | ORAL | Status: DC

## 2015-09-27 NOTE — Addendum Note (Signed)
Addended by: Dayna Barker on: 09/27/2015 04:36 PM   Modules accepted: Orders

## 2015-09-27 NOTE — Progress Notes (Signed)
Caitlin Wagner 1966/04/04 [id number]        49 y.o.  [id number] Presents complaining of vaginal irritation. Similar complaints in October. Was treated with Cleocin vaginal cream with resolution of her symptoms but now they've returned. No odor or urinary symptoms such as frequency dysuria or urgency.  Past medical history,surgical history, problem list, medications, allergies, family history and social history were all reviewed and documented in the EPIC chart.  Directed ROS with pertinent positives and negatives documented in the history of present illness/assessment and plan.  Exam: Kim assistant Filed Vitals:   09/27/15 1419  BP: 118/76   General appearance:  Normal Abdomen soft nontender without masses guarding rebound Pelvic external BS vagina with whitish discharge.  Cervix normal. Uterus normal size midline mobile nontender. Adnexa without masses or tenderness.  Assessment/Plan:  49 y.o. [id number] with above history and exam. Wet prep is unremarkable. To cover her for both yeast and bacterial vaginosis. Flagyl 500 mg twice a day 7 days, alcohol avoidance reviewed. Diflucan 150 mg 1 tablet at the beginning and one at the end of the antibiotic course. Follow up if symptoms persist, worsen or recur.    [name] MD, 2:42 PM 09/27/2015

## 2015-09-27 NOTE — Patient Instructions (Signed)
Take the Flagyl medication twice daily for 7 days. Avoid alcohol while taking. Take the Diflucan medication 1 pill at the beginning of the oral antibiotics and 1 pill at the end.  Follow up if symptoms persist worsen or recur.

## 2015-10-09 ENCOUNTER — Encounter: Payer: Self-pay | Admitting: Gynecology

## 2015-10-09 ENCOUNTER — Ambulatory Visit (INDEPENDENT_AMBULATORY_CARE_PROVIDER_SITE_OTHER): Payer: Medicare Other | Admitting: Gynecology

## 2015-10-09 ENCOUNTER — Other Ambulatory Visit (HOSPITAL_COMMUNITY)
Admission: RE | Admit: 2015-10-09 | Discharge: 2015-10-09 | Disposition: A | Discharge status: Home / Self Care | Payer: Medicare Other | Source: Ambulatory Visit | Attending: Gynecology | Admitting: Gynecology

## 2015-10-09 VITALS — BP 116/74 | Ht 61.0 in | Wt 154.0 lb

## 2015-10-09 DIAGNOSIS — N926 Irregular menstruation, unspecified: Secondary | ICD-10-CM | POA: Diagnosis not present

## 2015-10-09 DIAGNOSIS — Z01419 Encounter for gynecological examination (general) (routine) without abnormal findings: Secondary | ICD-10-CM | POA: Diagnosis not present

## 2015-10-09 DIAGNOSIS — Z01411 Encounter for gynecological examination (general) (routine) with abnormal findings: Secondary | ICD-10-CM | POA: Insufficient documentation

## 2015-10-09 DIAGNOSIS — Z1151 Encounter for screening for human papillomavirus (HPV): Secondary | ICD-10-CM | POA: Insufficient documentation

## 2015-10-09 DIAGNOSIS — Z124 Encounter for screening for malignant neoplasm of cervix: Secondary | ICD-10-CM | POA: Diagnosis not present

## 2015-10-09 NOTE — Addendum Note (Signed)
Addended by: Dayna Barker on: 10/09/2015 03:59 PM   Modules accepted: Orders

## 2015-10-09 NOTE — Progress Notes (Signed)
Caitlin Wagner 29-Oct-1965 [id number]        49 y.o.  [id number]  for breast and pelvic exam  Past medical history,surgical history, problem list, medications, allergies, family history and social history were all reviewed and documented as reviewed in the EPIC chart.  ROS:  Performed with pertinent positives and negatives included in the history, assessment and plan.   Additional significant findings :  none   Exam: [facility] person Vitals:   10/09/15 1526  BP: 116/74  Height: 5\' 1"  (1.549 m)  Weight: 154 lb (69.854 kg)   General appearance:  Normal affect, orientation and appearance. Skin: Grossly normal HEENT: Without gross lesions.  No cervical or supraclavicular adenopathy. Thyroid normal.  Lungs:  Clear without wheezing, rales or rhonchi Cardiac: RR, without RMG Abdominal:  Soft, nontender, without masses, guarding, rebound, organomegaly or hernia Breasts:  Examined lying and sitting without masses, retractions, discharge or axillary adenopathy. Pelvic:  Ext/BUS/vagina normal  Cervix normal. Pap/HPV  Uterus anteverted, normal size, shape and contour, midline and mobile nontender   Adnexa  Without masses or tenderness    Anus and perineum  Normal   Rectovaginal  Normal sphincter tone without palpated masses or tenderness.    Assessment/Plan:  49 y.o. G64P2002 female for breast and pelvic exam, irregular menses, vasectomy birth control  1. Irregular menses. Patient notes some skips in her menses this past year with her last period 06/20/2015. No prolonged or atypical bleeding.  Had some sporadic cough flushes but these have resolved. We'll check baseline TSH FSH. Reviewed possible association of premature menopause with autoimmune disorder such as rheumatoid arthritis. If FSH elevated will keep menstrual calendar as long as less frequent but regular menses will follow. If significant hot flashes or night sweats she will represent for HRT discussion. If lab work is normal I  discussed anovulatory situation and need for intermittent withdrawal with Provera every other month. We'll further discuss after lab results. 2. Pap smear/HPV negative 2014.  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 Pap smear/HPV today. 3. Mammography never. I again strongly recommended screening mammography. Benefits of early detection discussed with her. Patient promises to schedule. Names and numbers provided.   4. Health maintenance. No routine lab work done as patient reports this done at her primary physician's office. Follow up 1 year, sooner as needed.   2014 MD, 3:47 PM 10/09/2015

## 2015-10-09 NOTE — Patient Instructions (Signed)
Office will call you with the hormone test results.  Call to Schedule your mammogram  Facilities in [place]: 1)  [facility]. [address] [place] Phone: [phone]  Dr. Isaiah Blakes at [facility] [address] Phone: [phone]     Mammogram A mammogram is an X-ray test to find changes in a woman's breast. You should get a mammogram if:  You are 47 years of age or older  You have risk factors.   Your doctor recommends that you have one.  BEFORE THE TEST  Do not schedule the test the week before your period, especially if your breasts are sore during this time.  On the day of your mammogram:  Wash your breasts and armpits well. After washing, do not put on any deodorant or talcum powder on until after your test.   Eat and drink as you usually do.   Take your medicines as usual.   If you are diabetic and take insulin, make sure you:   Eat before coming for your test.   Take your insulin as usual.   If you cannot keep your appointment, call before the appointment to cancel. Schedule another appointment.  TEST  You will need to undress from the waist up. You will put on a hospital gown.   Your breast will be put on the mammogram machine, and it will press firmly on your breast with a piece of plastic called a compression paddle. This will make your breast flatter so that the machine can X-ray all parts of your breast.   Both breasts will be X-rayed. Each breast will be X-rayed from above and from the side. An X-ray might need to be taken again if the picture is not good enough.   The mammogram will last about 15 to 30 minutes.  AFTER THE TEST Finding out the results of your test Ask when your test results will be ready. Make sure you get your test results.  Document Released: 01/01/2009 Document Revised: 09/24/2011 Document Reviewed: 01/01/2009 [facility] Patient Information 2012 [place].  You may obtain a copy of any labs that were done today by logging onto MyChart as outlined in the instructions provided with your AVS (after visit summary). The office will not call with normal lab results but certainly if there are any significant abnormalities then we will contact you.   Health Maintenance Adopting a healthy lifestyle and getting preventive care can go a long way to promote health and wellness. Talk with your health care provider about what schedule of regular examinations is right for you. This is a good chance for you to check in with your provider about disease prevention and staying healthy. In between checkups, there are plenty of things you can do on your own. Experts have done a lot of research about which lifestyle changes and preventive measures are most likely to keep you healthy. Ask your health care provider for more information. WEIGHT AND DIET  Eat a healthy diet  Be sure to include plenty of vegetables, fruits, low-fat dairy products, and lean protein.  Do not eat a lot of foods high in solid fats, added sugars, or salt.  Get regular exercise. This is one of the most important things you can do for your health.  Most adults should exercise for at least 150 minutes each week. The exercise should increase your heart rate and make you sweat (moderate-intensity exercise).  Most  adults should also do strengthening exercises at least twice a week. This is in addition to the moderate-intensity exercise.  Maintain a healthy weight  Body mass index (BMI) is a measurement that can be used to identify possible weight problems. It estimates body fat based on height and weight. Your health care provider can help determine your BMI and help you achieve or maintain a healthy weight.  For females 44 years of age and older:   A BMI below 18.5 is considered underweight.  A BMI of 18.5 to 24.9 is normal.  A BMI of 25 to 29.9 is considered overweight.  A BMI of 30  and above is considered obese.  Watch levels of cholesterol and blood lipids  You should start having your blood tested for lipids and cholesterol at 47 years of age, then have this test every 5 years.  You may need to have your cholesterol levels checked more often if:  Your lipid or cholesterol levels are high.  You are older than 47 years of age.  You are at high risk for heart disease.  CANCER SCREENING   Lung Cancer  Lung cancer screening is recommended for adults 71-37 years old who are at high risk for lung cancer because of a history of smoking.  A yearly low-dose CT scan of the lungs is recommended for people who:  Currently smoke.  Have quit within the past 15 years.  Have at least a 30-pack-year history of smoking. A pack year is smoking an average of one pack of cigarettes a day for 1 year.  Yearly screening should continue until it has been 15 years since you quit.  Yearly screening should stop if you develop a health problem that would prevent you from having lung cancer treatment.  Breast Cancer  Practice breast self-awareness. This means understanding how your breasts normally appear and feel.  It also means doing regular breast self-exams. Let your health care provider know about any changes, no matter how small.  If you are in your 20s or 30s, you should have a clinical breast exam (CBE) by a health care provider every 1-3 years as part of a regular health exam.  If you are 26 or older, have a CBE every year. Also consider having a breast X-ray (mammogram) every year.  If you have a family history of breast cancer, talk to your health care provider about genetic screening.  If you are at high risk for breast cancer, talk to your health care provider about having an MRI and a mammogram every year.  Breast cancer gene (BRCA) assessment is recommended for women who have family members with BRCA-related cancers. BRCA-related cancers  include:  Breast.  Ovarian.  Tubal.  Peritoneal cancers.  Results of the assessment will determine the need for genetic counseling and BRCA1 and BRCA2 testing. Cervical Cancer Routine pelvic examinations to screen for cervical cancer are no longer recommended for nonpregnant women who are considered low risk for cancer of the pelvic organs (ovaries, uterus, and vagina) and who do not have symptoms. A pelvic examination may be necessary if you have symptoms including those associated with pelvic infections. Ask your health care provider if a screening pelvic exam is right for you.   The Pap test is the screening test for cervical cancer for women who are considered at risk.  If you had a hysterectomy for a problem that was not cancer or a condition that could lead to cancer, then you no longer need Pap  tests.  If you are older than 65 years, and you have had normal Pap tests for the past 10 years, you no longer need to have Pap tests.  If you have had past treatment for cervical cancer or a condition that could lead to cancer, you need Pap tests and screening for cancer for at least 20 years after your treatment.  If you no longer get a Pap test, assess your risk factors if they change (such as having a new sexual partner). This can affect whether you should start being screened again.  Some women have medical problems that increase their chance of getting cervical cancer. If this is the case for you, your health care provider may recommend more frequent screening and Pap tests.  The human papillomavirus (HPV) test is another test that may be used for cervical cancer screening. The HPV test looks for the virus that can cause cell changes in the cervix. The cells collected during the Pap test can be tested for HPV.  The HPV test can be used to screen women 67 years of age and older. Getting tested for HPV can extend the interval between normal Pap tests from three to five years.  An HPV  test also should be used to screen women of any age who have unclear Pap test results.  After 47 years of age, women should have HPV testing as often as Pap tests.  Colorectal Cancer  This type of cancer can be detected and often prevented.  Routine colorectal cancer screening usually begins at 47 years of age and continues through 47 years of age.  Your health care provider may recommend screening at an earlier age if you have risk factors for colon cancer.  Your health care provider may also recommend using home test kits to check for hidden blood in the stool.  A small camera at the end of a tube can be used to examine your colon directly (sigmoidoscopy or colonoscopy). This is done to check for the earliest forms of colorectal cancer.  Routine screening usually begins at age 34.  Direct examination of the colon should be repeated every 5-10 years through 47 years of age. However, you may need to be screened more often if early forms of precancerous polyps or small growths are found. Skin Cancer  Check your skin from head to toe regularly.  Tell your health care provider about any new moles or changes in moles, especially if there is a change in a mole's shape or color.  Also tell your health care provider if you have a mole that is larger than the size of a pencil eraser.  Always use sunscreen. Apply sunscreen liberally and repeatedly throughout the day.  Protect yourself by wearing long sleeves, pants, a wide-brimmed hat, and sunglasses whenever you are outside. HEART DISEASE, DIABETES, AND HIGH BLOOD PRESSURE   Have your blood pressure checked at least every 1-2 years. High blood pressure causes heart disease and increases the risk of stroke.  If you are between 82 years and 102 years old, ask your health care provider if you should take aspirin to prevent strokes.  Have regular diabetes screenings. This involves taking a blood sample to check your fasting blood sugar  level.  If you are at a normal weight and have a low risk for diabetes, have this test once every three years after 47 years of age.  If you are overweight and have a high risk for diabetes, consider being tested at a  younger age or more often. PREVENTING INFECTION  Hepatitis B  If you have a higher risk for hepatitis B, you should be screened for this virus. You are considered at high risk for hepatitis B if:  You were born in a country where hepatitis B is common. Ask your health care provider which countries are considered high risk.  Your parents were born in a high-risk country, and you have not been immunized against hepatitis B (hepatitis B vaccine).  You have HIV or AIDS.  You use needles to inject street drugs.  You live with someone who has hepatitis B.  You have had sex with someone who has hepatitis B.  You get hemodialysis treatment.  You take certain medicines for conditions, including cancer, organ transplantation, and autoimmune conditions. Hepatitis C  Blood testing is recommended for:  Everyone born from [birth year] through 1963.  Anyone with known risk factors for hepatitis C. Sexually transmitted infections (STIs)  You should be screened for sexually transmitted infections (STIs) including gonorrhea and chlamydia if:  You are sexually active and are younger than 47 years of age.  You are older than 47 years of age and your health care provider tells you that you are at risk for this type of infection.  Your sexual activity has changed since you were last screened and you are at an increased risk for chlamydia or gonorrhea. Ask your health care provider if you are at risk.  If you do not have HIV, but are at risk, it may be recommended that you take a prescription medicine daily to prevent HIV infection. This is called pre-exposure prophylaxis (PrEP). You are considered at risk if:  You are sexually active and do not regularly use condoms or know the HIV status  of your partner(s).  You take drugs by injection.  You are sexually active with a partner who has HIV. Talk with your health care provider about whether you are at high risk of being infected with HIV. If you choose to begin PrEP, you should first be tested for HIV. You should then be tested every 3 months for as long as you are taking PrEP.  PREGNANCY   If you are premenopausal and you may become pregnant, ask your health care provider about preconception counseling.  If you may become pregnant, take 400 to 800 micrograms (mcg) of folic acid every day.  If you want to prevent pregnancy, talk to your health care provider about birth control (contraception). OSTEOPOROSIS AND MENOPAUSE   Osteoporosis is a disease in which the bones lose minerals and strength with aging. This can result in serious bone fractures. Your risk for osteoporosis can be identified using a bone density scan.  If you are 41 years of age or older, or if you are at risk for osteoporosis and fractures, ask your health care provider if you should be screened.  Ask your health care provider whether you should take a calcium or vitamin D supplement to lower your risk for osteoporosis.  Menopause may have certain physical symptoms and risks.  Hormone replacement therapy may reduce some of these symptoms and risks. Talk to your health care provider about whether hormone replacement therapy is right for you.  HOME CARE INSTRUCTIONS   Schedule regular health, dental, and eye exams.  Stay current with your immunizations.   Do not use any tobacco products including cigarettes, chewing tobacco, or electronic cigarettes.  If you are pregnant, do not drink alcohol.  If you are breastfeeding, limit  how much and how often you drink alcohol.  Limit alcohol intake to no more than 1 drink per day for nonpregnant women. One drink equals 12 ounces of beer, 5 ounces of wine, or 1 ounces of hard liquor.  Do not use street  drugs.  Do not share needles.  Ask your health care provider for help if you need support or information about quitting drugs.  Tell your health care provider if you often feel depressed.  Tell your health care provider if you have ever been abused or do not feel safe at home. Document Released: 04/20/2011 Document Revised: 02/19/2014 Document Reviewed: 09/06/2013 [facility] Patient Information 2015 [place], [place]. This information is not intended to replace advice given to you by your health care provider. Make sure you discuss any questions you have with your health care provider.

## 2015-10-10 LAB — TSH: TSH: 1.026 u[IU]/mL (ref 0.350–4.500)

## 2015-10-10 LAB — FOLLICLE STIMULATING HORMONE: FSH: 21.3 m[IU]/mL

## 2015-10-11 LAB — CYTOLOGY - PAP

## 2015-11-14 ENCOUNTER — Ambulatory Visit (INDEPENDENT_AMBULATORY_CARE_PROVIDER_SITE_OTHER): Payer: Medicare Other | Admitting: Gynecology

## 2015-11-14 ENCOUNTER — Encounter: Payer: Self-pay | Admitting: Gynecology

## 2015-11-14 VITALS — BP 116/76

## 2015-11-14 DIAGNOSIS — R896 Abnormal cytological findings in specimens from other organs, systems and tissues: Secondary | ICD-10-CM | POA: Diagnosis not present

## 2015-11-14 DIAGNOSIS — IMO0002 Reserved for concepts with insufficient information to code with codable children: Secondary | ICD-10-CM

## 2015-11-14 NOTE — Progress Notes (Signed)
Caitlin Wagner November 09, 1965 [id number]        50 y.o.  [id number] Presents for colposcopy with history of LGSIL positive high-risk HPV 2013. Cervical biopsies at 6:00 showed LGSIL with negative ECC. Follow up Pap smear 2014 showed normal cytology with negative high-risk HPV. Most recent Pap smear 09/2015 showed LGSIL with negative high-risk HPV.  Past medical history,surgical history, problem list, medications, allergies, family history and social history were all reviewed and documented in the EPIC chart.  Directed ROS with pertinent positives and negatives documented in the history of present illness/assessment and plan.  Exam: Caitlin Wagner assistant Filed Vitals:   11/14/15 1416  BP: 116/76   General appearance:  Normal Abdomen soft nontender without masses guarding rebound Pelvic external BUS vagina normal. Cervix grossly normal. Uterus axial normal size shape and contour. Adnexa without masses or tenderness  Colposcopy after acetic acid cleanse is adequate with area of acetowhite change 3:00 transformation zone. Representative biopsy taken. ECC performed. Patient tolerated well. Physical Exam  Genitourinary:       Assessment/Plan:  50 y.o. 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 Patient will follow up for the biopsy results. Assuming low-grade them plan expectant management with Pap smear one year. If high-grade and will discuss treatment options. I again reviewed her increased risk of continuing dysplasia due to her rheumatoid arthritis and immunosuppression in the need for continued surveillance.Caitlin Lords MD, 2:40 PM 11/14/2015

## 2015-11-14 NOTE — Patient Instructions (Signed)
Office will call you with biopsy results 

## 2015-11-15 ENCOUNTER — Encounter: Payer: Self-pay | Admitting: Gynecology

## 2015-12-19 ENCOUNTER — Telehealth: Payer: Self-pay | Admitting: *Deleted

## 2015-12-19 NOTE — Telephone Encounter (Signed)
Pt called wanting to relay her recent cycles states after C&B appointment on 11/14/15 she had a period x 4 days light bleeding, 1 week later she had a 7 day cycle light flow, and now on day 6 of a period now, which is light as well. Pt said she does noticed bleeding after intercourse, pt is asked if the frequent cycles could be due to her upper limit of normal FSH? I told her I would relay that, but the bleeding after intercourse would be best for OV. Please advise

## 2015-12-19 NOTE — Telephone Encounter (Signed)
Could be due to early perimenopausal changes. I would recommend since its just starting to happen to watch it over the next month or 2. Same thing with postcoital spotting. If the postcoital spotting continues office visit. If significant irregularity as far as timing of her menstrual cycles also recommend office visit.

## 2015-12-19 NOTE — Telephone Encounter (Signed)
Left message for pt to call.

## 2015-12-20 NOTE — Telephone Encounter (Signed)
Pt informed with the below note. 

## 2016-10-03 DIAGNOSIS — M1611 Unilateral primary osteoarthritis, right hip: Secondary | ICD-10-CM | POA: Insufficient documentation

## 2016-10-09 ENCOUNTER — Encounter: Payer: Medicare Other | Admitting: Gynecology

## 2016-10-19 HISTORY — PX: KNEE ARTHROPLASTY: SHX992

## 2016-11-16 ENCOUNTER — Ambulatory Visit (INDEPENDENT_AMBULATORY_CARE_PROVIDER_SITE_OTHER): Payer: Medicare Other | Admitting: Gynecology

## 2016-11-16 ENCOUNTER — Encounter: Payer: Self-pay | Admitting: Gynecology

## 2016-11-16 VITALS — BP 118/76 | Ht 61.0 in | Wt 150.0 lb

## 2016-11-16 DIAGNOSIS — Z01419 Encounter for gynecological examination (general) (routine) without abnormal findings: Secondary | ICD-10-CM | POA: Diagnosis not present

## 2016-11-16 DIAGNOSIS — N898 Other specified noninflammatory disorders of vagina: Secondary | ICD-10-CM

## 2016-11-16 DIAGNOSIS — Z1151 Encounter for screening for human papillomavirus (HPV): Secondary | ICD-10-CM

## 2016-11-16 NOTE — Progress Notes (Signed)
    Caitlin Wagner 02/17/1966 [id number]        51 y.o.  G2P2002 for annual exam.  Complaining of vaginal dryness with intercourse. Not on a daily basis. Was having some hot flashes and sweats but these seem to have resolved. No periods or bleeding and over year.  Past medical history,surgical history, problem list, medications, allergies, family history and social history were all reviewed and documented as reviewed in the EPIC chart.  ROS:  Performed with pertinent positives and negatives included in the history, assessment and plan.   Additional significant findings :  None   Exam: [name] assistant Vitals:   11/16/16 1359  BP: 118/76  Weight: 150 lb (68 kg)  Height: 5\' 1"  (1.549 m)   Body mass index is 28.34 kg/m.  General appearance:  Normal affect, orientation and appearance. Skin: Grossly normal HEENT: Without gross lesions.  No cervical or supraclavicular adenopathy. Thyroid normal.  Lungs:  Clear without wheezing, rales or rhonchi Cardiac: RR, without RMG Abdominal:  Soft, nontender, without masses, guarding, rebound, organomegaly or hernia Breasts:  Examined lying and sitting without masses, retractions, discharge or axillary adenopathy. Pelvic:  Ext, BUS, Vagina normal  Cervix normal. Pap smear/HPV  Uterus anteverted, normal size, shape and contour, midline and mobile nontender   Adnexa without masses or tenderness    Anus and perineum normal   Rectovaginal normal sphincter tone without palpated masses or tenderness.    Assessment/Plan:  51 y.o. G4P2002 female for annual exam. Vasectomy birth control  1. Postmenopausal. Not having significant hot flushes or night sweats. Is having vaginal dryness with intercourse. Options for management to include systemic HRT, vaginal HRT or more aggressive use of lubricants reviewed. Risks versus benefits discussed. Stroke heart attack DVT breast cancer issues with HRT discussed. At this point patient wants to continue with  lubrication and will try oil-based products. Will follow up if continues to be an issue. 2. History of LGSIL with colposcopic biopsies last year. 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 Negative high-risk HPV 2014. Pap smear 2016 showed LGSIL with negative high-risk HPV. Colposcopy with biopsy showed LGSIL. Pap smear/HPV today. 3. Mammography never. Strongly recommended patient schedules screening mammography. SBE monthly reviewed. 4. Health maintenance. No routine lab work done as patient does this elsewhere. Follow up in one year, sooner as needed.   2017 MD, 2:19 PM 11/16/2016

## 2016-11-16 NOTE — Addendum Note (Signed)
Addended by: Dayna Barker on: 11/16/2016 02:29 PM   Modules accepted: Orders

## 2016-11-16 NOTE — Patient Instructions (Addendum)
Call to Schedule your mammogram  Facilities in [place]: 1)  [facility]. [facility], [address] Phone: [phone] 2)  Dr. Bertrand at [facility]  [address] Phone: [phone]     Mammogram A mammogram is an X-ray test to find changes in a woman's breast. You should get a mammogram if:  You are 49 years of age or older  You have risk factors.   Your doctor recommends that you have one.  BEFORE THE TEST  Do not schedule the test the week before your period, especially if your breasts are sore during this time.  On the day of your mammogram:  Wash your breasts and armpits well. After washing, do not put on any deodorant or talcum powder on until after your test.   Eat and drink as you usually do.   Take your medicines as usual.   If you are diabetic and take insulin, make sure you:   Eat before coming for your test.   Take your insulin as usual.   If you cannot keep your appointment, call before the appointment to cancel. Schedule another appointment.  TEST  You will need to undress from the waist up. You will put on a hospital gown.   Your breast will be put on the mammogram machine, and it will press firmly on your breast with a piece of plastic called a compression paddle. This will make your breast flatter so that the machine can X-ray all parts of your breast.   Both breasts will be X-rayed. Each breast will be X-rayed from above and from the side. An X-ray might need to be taken again if the picture is not good enough.   The mammogram will last about 15 to 30 minutes.  AFTER THE TEST Finding out the results of your test Ask when your test results will be ready. Make sure you get your test results.  Document Released: 01/01/2009 Document Revised: 09/24/2011 Document Reviewed: 01/01/2009 [employer] Patient Information 2012 [employer].  You may obtain a copy of any labs that were  done today by logging onto MyChart as outlined in the instructions provided with your AVS (after visit summary). The office will not call with normal lab results but certainly if there are any significant abnormalities then we will contact you.   Health Maintenance Adopting a healthy lifestyle and getting preventive care can go a long way to promote health and wellness. Talk with your health care provider about what schedule of regular examinations is right for you. This is a good chance for you to check in with your provider about disease prevention and staying healthy. In between checkups, there are plenty of things you can do on your own. Experts have done a lot of research about which lifestyle changes and preventive measures are most likely to keep you healthy. Ask your health care provider for more information. WEIGHT AND DIET  Eat a healthy diet  Be sure to include plenty of vegetables, fruits, low-fat dairy products, and lean protein.  Do not eat a lot of foods high in solid fats, added sugars, or salt.  Get regular exercise. This is one of the most important things you can do for your health.  Most adults should exercise for at least 150 minutes each week. The exercise should increase your heart rate and make you sweat (moderate-intensity exercise).  Most adults should also do strengthening exercises at least twice a   week. This is in addition to the moderate-intensity exercise.  Maintain a healthy weight  Body mass index (BMI) is a measurement that can be used to identify possible weight problems. It estimates body fat based on height and weight. Your health care provider can help determine your BMI and help you achieve or maintain a healthy weight.  For females 20 years of age and older:   A BMI below 18.5 is considered underweight.  A BMI of 18.5 to 24.9 is normal.  A BMI of 25 to 29.9 is considered overweight.  A BMI of 30 and above is considered obese.  Watch levels of  cholesterol and blood lipids  You should start having your blood tested for lipids and cholesterol at 49 years of age, then have this test every 5 years.  You may need to have your cholesterol levels checked more often if:  Your lipid or cholesterol levels are high.  You are older than 50 years of age.  You are at high risk for heart disease.  CANCER SCREENING   Lung Cancer  Lung cancer screening is recommended for adults 55-80 years old who are at high risk for lung cancer because of a history of smoking.  A yearly low-dose CT scan of the lungs is recommended for people who:  Currently smoke.  Have quit within the past 15 years.  Have at least a 30-pack-year history of smoking. A pack year is smoking an average of one pack of cigarettes a day for 1 year.  Yearly screening should continue until it has been 15 years since you quit.  Yearly screening should stop if you develop a health problem that would prevent you from having lung cancer treatment.  Breast Cancer  Practice breast self-awareness. This means understanding how your breasts normally appear and feel.  It also means doing regular breast self-exams. Let your health care provider know about any changes, no matter how small.  If you are in your 20s or 30s, you should have a clinical breast exam (CBE) by a health care provider every 1-3 years as part of a regular health exam.  If you are 40 or older, have a CBE every year. Also consider having a breast X-ray (mammogram) every year.  If you have a family history of breast cancer, talk to your health care provider about genetic screening.  If you are at high risk for breast cancer, talk to your health care provider about having an MRI and a mammogram every year.  Breast cancer gene (BRCA) assessment is recommended for women who have family members with BRCA-related cancers. BRCA-related cancers include:  Breast.  Ovarian.  Tubal.  Peritoneal  cancers.  Results of the assessment will determine the need for genetic counseling and BRCA1 and BRCA2 testing. Cervical Cancer Routine pelvic examinations to screen for cervical cancer are no longer recommended for nonpregnant women who are considered low risk for cancer of the pelvic organs (ovaries, uterus, and vagina) and who do not have symptoms. A pelvic examination may be necessary if you have symptoms including those associated with pelvic infections. Ask your health care provider if a screening pelvic exam is right for you.   The Pap test is the screening test for cervical cancer for women who are considered at risk.  If you had a hysterectomy for a problem that was not cancer or a condition that could lead to cancer, then you no longer need Pap tests.  If you are older than 65 years, and   you have had normal Pap tests for the past 10 years, you no longer need to have Pap tests.  If you have had past treatment for cervical cancer or a condition that could lead to cancer, you need Pap tests and screening for cancer for at least 20 years after your treatment.  If you no longer get a Pap test, assess your risk factors if they change (such as having a new sexual partner). This can affect whether you should start being screened again.  Some women have medical problems that increase their chance of getting cervical cancer. If this is the case for you, your health care provider may recommend more frequent screening and Pap tests.  The human papillomavirus (HPV) test is another test that may be used for cervical cancer screening. The HPV test looks for the virus that can cause cell changes in the cervix. The cells collected during the Pap test can be tested for HPV.  The HPV test can be used to screen women 30 years of age and older. Getting tested for HPV can extend the interval between normal Pap tests from three to five years.  An HPV test also should be used to screen women of any age who  have unclear Pap test results.  After 49 years of age, women should have HPV testing as often as Pap tests.  Colorectal Cancer  This type of cancer can be detected and often prevented.  Routine colorectal cancer screening usually begins at 50 years of age and continues through 49 years of age.  Your health care provider may recommend screening at an earlier age if you have risk factors for colon cancer.  Your health care provider may also recommend using home test kits to check for hidden blood in the stool.  A small camera at the end of a tube can be used to examine your colon directly (sigmoidoscopy or colonoscopy). This is done to check for the earliest forms of colorectal cancer.  Routine screening usually begins at age 50.  Direct examination of the colon should be repeated every 5-10 years through 49 years of age. However, you may need to be screened more often if early forms of precancerous polyps or small growths are found. Skin Cancer  Check your skin from head to toe regularly.  Tell your health care provider about any new moles or changes in moles, especially if there is a change in a mole's shape or color.  Also tell your health care provider if you have a mole that is larger than the size of a pencil eraser.  Always use sunscreen. Apply sunscreen liberally and repeatedly throughout the day.  Protect yourself by wearing long sleeves, pants, a wide-brimmed hat, and sunglasses whenever you are outside. HEART DISEASE, DIABETES, AND HIGH BLOOD PRESSURE   Have your blood pressure checked at least every 1-2 years. High blood pressure causes heart disease and increases the risk of stroke.  If you are between 55 years and 79 years old, ask your health care provider if you should take aspirin to prevent strokes.  Have regular diabetes screenings. This involves taking a blood sample to check your fasting blood sugar level.  If you are at a normal weight and have a low risk for  diabetes, have this test once every three years after 49 years of age.  If you are overweight and have a high risk for diabetes, consider being tested at a younger age or more often. PREVENTING INFECTION  Hepatitis B    If you have a higher risk for hepatitis B, you should be screened for this virus. You are considered at high risk for hepatitis B if:  You were born in a country where hepatitis B is common. Ask your health care provider which countries are considered high risk.  Your parents were born in a high-risk country, and you have not been immunized against hepatitis B (hepatitis B vaccine).  You have HIV or AIDS.  You use needles to inject street drugs.  You live with someone who has hepatitis B.  You have had sex with someone who has hepatitis B.  You get hemodialysis treatment.  You take certain medicines for conditions, including cancer, organ transplantation, and autoimmune conditions. Hepatitis C  Blood testing is recommended for:  Everyone born from 1945 through 1965.  Anyone with known risk factors for hepatitis C. Sexually transmitted infections (STIs)  You should be screened for sexually transmitted infections (STIs) including gonorrhea and chlamydia if:  You are sexually active and are younger than 49 years of age.  You are older than 49 years of age and your health care provider tells you that you are at risk for this type of infection.  Your sexual activity has changed since you were last screened and you are at an increased risk for chlamydia or gonorrhea. Ask your health care provider if you are at risk.  If you do not have HIV, but are at risk, it may be recommended that you take a prescription medicine daily to prevent HIV infection. This is called pre-exposure prophylaxis (PrEP). You are considered at risk if:  You are sexually active and do not regularly use condoms or know the HIV status of your partner(s).  You take drugs by injection.  You are  sexually active with a partner who has HIV. Talk with your health care provider about whether you are at high risk of being infected with HIV. If you choose to begin PrEP, you should first be tested for HIV. You should then be tested every 3 months for as long as you are taking PrEP.  PREGNANCY   If you are premenopausal and you may become pregnant, ask your health care provider about preconception counseling.  If you may become pregnant, take 400 to 800 micrograms (mcg) of folic acid every day.  If you want to prevent pregnancy, talk to your health care provider about birth control (contraception). OSTEOPOROSIS AND MENOPAUSE   Osteoporosis is a disease in which the bones lose minerals and strength with aging. This can result in serious bone fractures. Your risk for osteoporosis can be identified using a bone density scan.  If you are 65 years of age or older, or if you are at risk for osteoporosis and fractures, ask your health care provider if you should be screened.  Ask your health care provider whether you should take a calcium or vitamin D supplement to lower your risk for osteoporosis.  Menopause may have certain physical symptoms and risks.  Hormone replacement therapy may reduce some of these symptoms and risks. Talk to your health care provider about whether hormone replacement therapy is right for you.  HOME CARE INSTRUCTIONS   Schedule regular health, dental, and eye exams.  Stay current with your immunizations.   Do not use any tobacco products including cigarettes, chewing tobacco, or electronic cigarettes.  If you are pregnant, do not drink alcohol.  If you are breastfeeding, limit how much and how often you drink alcohol.  Limit alcohol   intake to no more than 1 drink per day for nonpregnant women. One drink equals 12 ounces of beer, 5 ounces of wine, or 1 ounces of hard liquor.  Do not use street drugs.  Do not share needles.  Ask your health care provider for  help if you need support or information about quitting drugs.  Tell your health care provider if you often feel depressed.  Tell your health care provider if you have ever been abused or do not feel safe at home. Document Released: 04/20/2011 Document Revised: 02/19/2014 Document Reviewed: 09/06/2013 [facility] Patient Information 2015 [place], [place]. This information is not intended to replace advice given to you by your health care provider. Make sure you discuss any questions you have with your health care provider.

## 2016-11-17 LAB — PAP IG AND HPV HIGH-RISK: HPV DNA High Risk: NOT DETECTED

## 2016-12-16 ENCOUNTER — Other Ambulatory Visit: Payer: Self-pay | Admitting: Physician Assistant

## 2016-12-16 DIAGNOSIS — R222 Localized swelling, mass and lump, trunk: Secondary | ICD-10-CM

## 2016-12-17 DIAGNOSIS — A609 Anogenital herpesviral infection, unspecified: Secondary | ICD-10-CM

## 2016-12-17 HISTORY — DX: Anogenital herpesviral infection, unspecified: A60.9

## 2016-12-18 ENCOUNTER — Ambulatory Visit
Admission: RE | Admit: 2016-12-18 | Discharge: 2016-12-18 | Disposition: A | Discharge status: Home / Self Care | Payer: Medicare Other | Source: Ambulatory Visit | Attending: Physician Assistant | Admitting: Physician Assistant

## 2016-12-18 DIAGNOSIS — R222 Localized swelling, mass and lump, trunk: Secondary | ICD-10-CM

## 2016-12-18 MED ORDER — IOPAMIDOL (ISOVUE-300) INJECTION 61%
75.0000 mL | Freq: Once | INTRAVENOUS | Status: AC | PRN
  Administered 2016-12-18: 75 mL via INTRAVENOUS

## 2017-01-14 ENCOUNTER — Telehealth: Payer: Self-pay | Admitting: Obstetrics and Gynecology

## 2017-01-14 ENCOUNTER — Ambulatory Visit (INDEPENDENT_AMBULATORY_CARE_PROVIDER_SITE_OTHER): Payer: Medicare Other | Admitting: Gynecology

## 2017-01-14 ENCOUNTER — Encounter: Payer: Self-pay | Admitting: Gynecology

## 2017-01-14 ENCOUNTER — Other Ambulatory Visit: Payer: Self-pay | Admitting: Obstetrics and Gynecology

## 2017-01-14 VITALS — BP 124/80

## 2017-01-14 DIAGNOSIS — N766 Ulceration of vulva: Secondary | ICD-10-CM | POA: Diagnosis not present

## 2017-01-14 MED ORDER — VALACYCLOVIR HCL 500 MG PO TABS: 500.0000 mg | ORAL_TABLET | Freq: Two times a day (BID) | ORAL | 0 refills | Status: DC

## 2017-01-14 NOTE — Addendum Note (Signed)
Addended by: Berna Spare A on: 01/14/2017 02:33 PM   Modules accepted: Orders

## 2017-01-14 NOTE — Patient Instructions (Signed)
Take the valtrex twice daily for 5 days

## 2017-01-14 NOTE — Telephone Encounter (Signed)
After hours call from patient.  Seen in office today. Valtrex not sent in to pharmacy.   Office note reviewed.   Rx sent in for Valtrex 500 mg po bid x 5 days prn.  #30, RF zero.

## 2017-01-14 NOTE — Progress Notes (Signed)
    Caitlin Wagner 06-Oct-1966 [id number]        51 y.o.  [id number] presents with several day history of vaginal irritation. She was recently treated for sinusitis. Her boyfriend complaint of a rash in his genital area and she took an over-the-counter antifungal as a precaution. Notes for several days and irritated area on the right side of her vagina. No urinary symptoms such as frequency dysuria urgency low back pain fever or chills.  Past medical history,surgical history, problem list, medications, allergies, family history and social history were all reviewed and documented in the EPIC chart.  Directed ROS with pertinent positives and negatives documented in the history of present illness/assessment and plan.  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 Vagina without discharge. Cervix normal. Uterus normal size midline mobile nontender. Adnexa without masses or tenderness.  Assessment/Plan:  51 y.o. 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 Culture done. We'll treat with Valtrex 500 mg twice a day 5 days. Discussed with patient various scenarios. She did relate that her boyfriend does have HSV-2 which certainly makes this all consistent. If culture positive she'll decide whether she wants to go on daily suppressive therapy over the next 6 months to year or whether she wants to wait and just use intermittent Valtrex 500 mg twice a day 5 days with outbreaks. If culture negative then options to treat regardless with Valtrex for recurrences accepting that this was a false negative or possibly checking for HSV antibodies 1 and 2 in several months. Will follow up for culture results and then go from there.    [name] MD, 2:22 PM 01/14/2017

## 2017-01-16 LAB — SURESWAB HSV, TYPE 1/2 DNA, PCR
HSV 1 DNA: NOT DETECTED
HSV 2 DNA: DETECTED — AB

## 2017-01-18 ENCOUNTER — Encounter: Payer: Self-pay | Admitting: Gynecology

## 2017-02-04 ENCOUNTER — Ambulatory Visit: Payer: Self-pay | Admitting: Orthopedic Surgery

## 2017-03-02 ENCOUNTER — Ambulatory Visit: Payer: Self-pay | Admitting: Orthopedic Surgery

## 2017-03-02 NOTE — H&P (Signed)
TOTAL KNEE ADMISSION H&P  Patient is being admitted for left total knee arthroplasty.  Subjective:  Chief Complaint:left knee pain.  HPI: Caitlin Wagner, 51 y.o. female, has a history of pain and functional disability in the left knee due to arthritis and has failed non-surgical conservative treatments for greater than 12 weeks to includeNSAID's and/or analgesics, corticosteriod injections, flexibility and strengthening excercises, use of assistive devices, weight reduction as appropriate and activity modification.  Onset of symptoms was gradual, starting >10 years ago with gradually worsening course since that time. The patient noted no past surgery on the left knee(s).  Patient currently rates pain in the left knee(s) at 10 out of 10 with activity. Patient has night pain, worsening of pain with activity and weight bearing, pain that interferes with activities of daily living, pain with passive range of motion, crepitus and joint swelling.  Patient has evidence of subchondral cysts, subchondral sclerosis, periarticular osteophytes, joint subluxation and joint space narrowing by imaging studies. There is no active infection.  Patient Active Problem List   Diagnosis Date Noted  . Rheumatoid arthritis(714.0)    Past Medical History:  Diagnosis Date  . 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 Rheumatoid arthritis(714.0)     Past Surgical History:  Procedure Laterality Date  . feet reconstruction surgery     bilateral   . TONSILLECTOMY  age 62     (Not in a hospital admission) Allergies  Allergen Reactions  . Remicade [Infliximab] Anaphylaxis  . Codeine     Extreme feeling of something crawling    Social History  Substance Use Topics  . Smoking status: Former Smoker    Packs/day: 0.30  .  Smokeless tobacco: Never Used  . Alcohol use No    Family History  Problem Relation Age of Onset  . Heart disease Father        heart attack at age 83  . Diverticulitis Father   . Diabetes Paternal Grandmother   . Heart disease Paternal Grandfather   . Heart failure Maternal Grandmother   . Cancer Maternal Grandfather        ? type     Review of Systems  Constitutional: Negative.   HENT: Negative.   Eyes: Negative.   Respiratory: Negative.   Cardiovascular: Negative.   Gastrointestinal: Negative.   Musculoskeletal: Positive for joint pain.  Skin: Negative.   Neurological: Negative.   Endo/Heme/Allergies: Positive for environmental allergies.  Psychiatric/Behavioral: Negative.     Objective:  Physical Exam  Vitals reviewed. Constitutional: She is oriented to person, place, and time. She appears well-developed.  HENT:  Head: Normocephalic and atraumatic.  Eyes: Conjunctivae and EOM are normal. Pupils are equal, round, and reactive to light.  Neck: Normal range of motion. Neck supple.  Cardiovascular: Normal rate, regular rhythm and intact distal pulses.   Respiratory: She is in respiratory distress.  GI: Soft. She exhibits no distension.  Genitourinary:  Genitourinary Comments: deferred  Musculoskeletal:       Left knee: She exhibits swelling, effusion and ecchymosis. Tenderness found. Medial joint line and lateral joint line tenderness noted.  Neurological: She is alert and oriented to person, place, and time. She has normal reflexes.  Skin: Skin is warm and dry.  Psychiatric: She has a normal mood and affect. Her behavior is normal. Judgment and thought  content normal.    Vital signs in last 24 hours: @VSRANGES @  Labs:   Estimated body mass index is 28.34 kg/m as calculated from the following:   Height as of 11/16/16: 5\' 1"  (1.549 m).   Weight as of 11/16/16: 68 kg (150 lb).   Imaging Review Plain radiographs demonstrate severe degenerative joint disease  of the left knee(s). The overall alignment issignificant valgus. The bone quality appears to be adequate for age and reported activity level.  Assessment/Plan:  End stage arthritis, left knee   The patient history, physical examination, clinical judgment of the provider and imaging studies are consistent with end stage degenerative joint disease of the left knee(s) and total knee arthroplasty is deemed medically necessary. The treatment options including medical management, injection therapy arthroscopy and arthroplasty were discussed at length. The risks and benefits of total knee arthroplasty were presented and reviewed. The risks due to aseptic loosening, infection, stiffness, patella tracking problems, thromboembolic complications and other imponderables were discussed. The patient acknowledged the explanation, agreed to proceed with the plan and consent was signed. Patient is being admitted for inpatient treatment for surgery, pain control, PT, OT, prophylactic antibiotics, VTE prophylaxis, progressive ambulation and ADL's and discharge planning. The patient is planning to be discharged home with home health services

## 2017-03-09 ENCOUNTER — Telehealth: Payer: Self-pay | Admitting: *Deleted

## 2017-03-09 MED ORDER — VALACYCLOVIR HCL 500 MG PO TABS: 500.0000 mg | ORAL_TABLET | Freq: Every day | ORAL | 11 refills | Status: DC

## 2017-03-09 NOTE — Telephone Encounter (Signed)
(  Dr.Fontiane patient) pt called requesting daily suppression for HSV-2. Pt said outbreaks are becoming more frequent. Please advise

## 2017-03-09 NOTE — Telephone Encounter (Signed)
Rx sent pt aware 

## 2017-03-09 NOTE — Telephone Encounter (Signed)
Call in prescription for Valtrex 500 mg one tablet daily. #30 with 11 refills

## 2017-03-12 ENCOUNTER — Encounter (HOSPITAL_COMMUNITY): Payer: Self-pay

## 2017-03-12 ENCOUNTER — Encounter (HOSPITAL_COMMUNITY)
Admission: RE | Admit: 2017-03-12 | Discharge: 2017-03-12 | Disposition: A | Discharge status: Home / Self Care | Payer: Medicare Other | Source: Ambulatory Visit | Attending: Orthopedic Surgery | Admitting: Orthopedic Surgery

## 2017-03-12 DIAGNOSIS — Z01818 Encounter for other preprocedural examination: Secondary | ICD-10-CM | POA: Diagnosis present

## 2017-03-12 DIAGNOSIS — R918 Other nonspecific abnormal finding of lung field: Secondary | ICD-10-CM | POA: Diagnosis not present

## 2017-03-12 DIAGNOSIS — Z79899 Other long term (current) drug therapy: Secondary | ICD-10-CM | POA: Diagnosis not present

## 2017-03-12 DIAGNOSIS — M1712 Unilateral primary osteoarthritis, left knee: Secondary | ICD-10-CM | POA: Insufficient documentation

## 2017-03-12 DIAGNOSIS — M069 Rheumatoid arthritis, unspecified: Secondary | ICD-10-CM | POA: Insufficient documentation

## 2017-03-12 DIAGNOSIS — Z87891 Personal history of nicotine dependence: Secondary | ICD-10-CM | POA: Insufficient documentation

## 2017-03-12 DIAGNOSIS — Z01812 Encounter for preprocedural laboratory examination: Secondary | ICD-10-CM | POA: Diagnosis not present

## 2017-03-12 DIAGNOSIS — Z0183 Encounter for blood typing: Secondary | ICD-10-CM | POA: Insufficient documentation

## 2017-03-12 HISTORY — DX: Unspecified osteoarthritis, unspecified site: M19.90

## 2017-03-12 LAB — CBC
HCT: 41.4 % (ref 36.0–46.0)
Hemoglobin: 13.3 g/dL (ref 12.0–15.0)
MCH: 29 pg (ref 26.0–34.0)
MCHC: 32.1 g/dL (ref 30.0–36.0)
MCV: 90.4 fL (ref 78.0–100.0)
Platelets: 246 10*3/uL (ref 150–400)
RBC: 4.58 MIL/uL (ref 3.87–5.11)
RDW: 13.6 % (ref 11.5–15.5)
WBC: 6.3 10*3/uL (ref 4.0–10.5)

## 2017-03-12 LAB — TYPE AND SCREEN
ABO/RH(D): A NEG
Antibody Screen: NEGATIVE

## 2017-03-12 LAB — URINALYSIS, ROUTINE W REFLEX MICROSCOPIC
Bilirubin Urine: NEGATIVE
Glucose, UA: NEGATIVE mg/dL
Hgb urine dipstick: NEGATIVE
Ketones, ur: NEGATIVE mg/dL
Leukocytes, UA: NEGATIVE
Nitrite: NEGATIVE
Protein, ur: NEGATIVE mg/dL
Specific Gravity, Urine: 1.009 (ref 1.005–1.030)
pH: 5 (ref 5.0–8.0)

## 2017-03-12 LAB — SURGICAL PCR SCREEN
MRSA, PCR: NEGATIVE
Staphylococcus aureus: NEGATIVE

## 2017-03-12 LAB — HCG, SERUM, QUALITATIVE: Preg, Serum: NEGATIVE

## 2017-03-12 LAB — BASIC METABOLIC PANEL
Anion gap: 8 (ref 5–15)
BUN: 12 mg/dL (ref 6–20)
CO2: 24 mmol/L (ref 22–32)
Calcium: 9.1 mg/dL (ref 8.9–10.3)
Chloride: 103 mmol/L (ref 101–111)
Creatinine, Ser: 0.56 mg/dL (ref 0.44–1.00)
GFR calc Af Amer: >60 mL/min (ref >60)
GFR calc non Af Amer: >60 mL/min (ref >60)
Glucose, Bld: 76 mg/dL (ref 65–99)
Potassium: 3.8 mmol/L (ref 3.5–5.1)
Sodium: 135 mmol/L (ref 135–145)

## 2017-03-12 LAB — ABO/RH: ABO/RH(D): A NEG

## 2017-03-12 NOTE — Progress Notes (Addendum)
UUE:KCMKLK, Gloriajean Dell, MD  Cardiologist: pt denies  EKG: 11/2016 Care everywhere-results on chart  Stress test: pt denies ever  ECHO: pt denies every  Cardiac Cath: pt denies  Chest x-ray: 11/2016 Care Everywhere-results on chart  Advised to stop Xeljanz 1 week before surgery and 2 weeks after surgery and to continue Arava normally by rheumatologist.

## 2017-03-12 NOTE — Pre-Procedure Instructions (Signed)
Caitlin Wagner  03/12/2017      [facility] [facility] [address] - [place], [place] - [address] [facility] [address] [facility] [place] [address] Phone: [phone] Fax: [phone]  [id number] - [place], [place] - [address] AT [facility] [address] [facility] [address] [facility] [address] [place] [address] Phone: [phone] Fax: [phone]    Your procedure is scheduled on March 22, 2017.  Report to [facility] Admitting at 945 AM.  Call this number if you have problems the morning of surgery:[phone]   Remember:  Do not eat food or drink liquids after midnight.  Take these medicines the morning of surgery with A SIP OF WATER acetaminophen (tylenol).  Stop leflunomide (Arava) and Tofacitinib Harriette Ohara) if instructed by your surgeon.  7 days prior to surgery STOP taking any Aspirin, Aleve, Naproxen, Ibuprofen, Motrin, Advil, Goody's, BC's, all herbal medications, fish oil, and all vitamins   Do not wear jewelry, make-up or nail polish.  Do not wear lotions, powders, or perfumes, or deoderant.  Do not shave 48 hours prior to surgery.  Men may shave face and neck.  Do not bring valuables to the hospital.  [facility] is not responsible for any belongings or valuables.  Contacts, dentures or bridgework may not be worn into surgery.  Leave your suitcase in the car.  After surgery it may be brought to your room.  For patients admitted to the hospital, discharge time will be determined by your treatment team.  Patients discharged the day of surgery will not be allowed to drive home.    Special instructions:   [place]- Preparing For Surgery  Before surgery, you can play an important role. Because skin is not sterile, your skin needs to be as free of germs as possible. You can reduce the number of germs on your skin by washing with CHG (chlorahexidine gluconate) Soap before surgery.  CHG is an antiseptic cleaner which kills germs  and bonds with the skin to continue killing germs even after washing.  Please do not use if you have an allergy to CHG or antibacterial soaps. If your skin becomes reddened/irritated stop using the CHG.  Do not shave (including legs and underarms) for at least 48 hours prior to first CHG shower. It is OK to shave your face.  Please follow these instructions carefully.   1. Shower the NIGHT BEFORE SURGERY and the MORNING OF SURGERY with CHG.   2. If you chose to wash your hair, wash your hair first as usual with your normal shampoo.  3. After you shampoo, rinse your hair and body thoroughly to remove the shampoo.  4. Use CHG as you would any other liquid soap. You can apply CHG directly to the skin and wash gently with a scrungie or a clean washcloth.   5. Apply the CHG Soap to your body ONLY FROM THE NECK DOWN.  Do not use on open wounds or open sores. Avoid contact with your eyes, ears, mouth and genitals (private parts). Wash genitals (private parts) with your normal soap.  6. Wash thoroughly, paying special attention to the area where your surgery will be performed.  7. Thoroughly rinse your body with warm water from the neck down.  8. DO NOT shower/wash with your normal soap after using and rinsing off the CHG Soap.  9. Pat yourself dry with a CLEAN TOWEL.   10. Wear CLEAN PAJAMAS   11. Place CLEAN  SHEETS on your bed the night of your first shower and DO NOT SLEEP WITH PETS.    Day of Surgery: Do not apply any deodorants/lotions. Please wear clean clothes to the hospital/surgery center.     Please read over the following fact sheets that you were given. Pain Booklet, Coughing and Deep Breathing, MRSA Information and Surgical Site Infection Prevention

## 2017-03-16 NOTE — Progress Notes (Signed)
Anesthesia Chart Review:  Pt is a 51 year old female scheduled for L total knee arthroplasty with navigation on 03/22/2017 with Samson Frederic, M.D.  - PCP is Benedetto Goad, MD. Pt cleared for surgery at last office visit 12/15/16 by Charlies Silvers, PA  PMH includes: RA, pulmonary nodule. Former smoker. BMI 29.  Medications include: Baird Kay. Pt to stop xeljanz 1 week prior to surgery and continue arava perioperatively per her rheumatologist.   Preoperative labs reviewed.    CT chest 12/18/16:  - Minimal bronchitic changes and peripheral basilar atelectasis. - No LEFT perihilar CT abnormality identified. - Tiny pulmonary nodules 5 mm diameter RIGHT upper lobe and 3 mm diameter RIGHT lower lobe. Non-contrast chest CT can be considered in 12 months if patient is high-risk  EKG 12/15/16: NSR.   If no changes, I anticipate pt can proceed with surgery as scheduled.   Rica Mast, FNP-BC [facility] Short Stay Surgical Center/Anesthesiology Phone: [phone] 03/16/2017 12:26 PM

## 2017-03-19 MED ORDER — SODIUM CHLORIDE 0.9 % IV SOLN: INTRAVENOUS | Status: DC

## 2017-03-19 MED ORDER — CEFAZOLIN SODIUM-DEXTROSE 2-4 GM/100ML-% IV SOLN
2.0000 g | INTRAVENOUS | Status: AC
  Administered 2017-03-22: 2 g via INTRAVENOUS
  Filled 2017-03-19: qty 100

## 2017-03-19 MED ORDER — ACETAMINOPHEN 10 MG/ML IV SOLN
1000.0000 mg | INTRAVENOUS | Status: AC
  Administered 2017-03-22: 1000 mg via INTRAVENOUS
  Filled 2017-03-19: qty 100

## 2017-03-19 MED ORDER — TRANEXAMIC ACID 1000 MG/10ML IV SOLN
1000.0000 mg | INTRAVENOUS | Status: AC
  Administered 2017-03-22: 1000 mg via INTRAVENOUS
  Filled 2017-03-19: qty 10

## 2017-03-21 DIAGNOSIS — Z96652 Presence of left artificial knee joint: Secondary | ICD-10-CM | POA: Insufficient documentation

## 2017-03-22 ENCOUNTER — Inpatient Hospital Stay (HOSPITAL_COMMUNITY): Payer: Medicare Other | Admitting: Vascular Surgery

## 2017-03-22 ENCOUNTER — Encounter (HOSPITAL_COMMUNITY): Payer: Self-pay | Admitting: *Deleted

## 2017-03-22 ENCOUNTER — Encounter (HOSPITAL_COMMUNITY)
Admission: RE | Disposition: A | Discharge status: Home Health | Payer: Self-pay | Source: Ambulatory Visit | Attending: Orthopedic Surgery

## 2017-03-22 ENCOUNTER — Inpatient Hospital Stay (HOSPITAL_COMMUNITY): Payer: Medicare Other | Admitting: Certified Registered Nurse Anesthetist

## 2017-03-22 ENCOUNTER — Inpatient Hospital Stay (HOSPITAL_COMMUNITY)
Admission: RE | Admit: 2017-03-22 | Discharge: 2017-03-24 | DRG: 470 | Disposition: A | Discharge status: Home Health | Payer: Medicare Other | Source: Ambulatory Visit | Attending: Orthopedic Surgery | Admitting: Orthopedic Surgery

## 2017-03-22 ENCOUNTER — Inpatient Hospital Stay (HOSPITAL_COMMUNITY): Payer: Medicare Other

## 2017-03-22 DIAGNOSIS — Z87891 Personal history of nicotine dependence: Secondary | ICD-10-CM

## 2017-03-22 DIAGNOSIS — Z888 Allergy status to other drugs, medicaments and biological substances status: Secondary | ICD-10-CM | POA: Diagnosis not present

## 2017-03-22 DIAGNOSIS — M069 Rheumatoid arthritis, unspecified: Secondary | ICD-10-CM | POA: Diagnosis present

## 2017-03-22 DIAGNOSIS — M1712 Unilateral primary osteoarthritis, left knee: Principal | ICD-10-CM | POA: Diagnosis present

## 2017-03-22 DIAGNOSIS — Z885 Allergy status to narcotic agent status: Secondary | ICD-10-CM

## 2017-03-22 DIAGNOSIS — Z09 Encounter for follow-up examination after completed treatment for conditions other than malignant neoplasm: Secondary | ICD-10-CM

## 2017-03-22 HISTORY — PX: TOTAL KNEE ARTHROPLASTY: SHX125

## 2017-03-22 SURGERY — ARTHROPLASTY, KNEE, TOTAL
Anesthesia: Monitor Anesthesia Care | Site: Knee | Laterality: Left

## 2017-03-22 MED ORDER — TRANEXAMIC ACID 1000 MG/10ML IV SOLN
1000.0000 mg | Freq: Once | INTRAVENOUS | Status: AC
  Administered 2017-03-22: 1000 mg via INTRAVENOUS
  Filled 2017-03-22: qty 10

## 2017-03-22 MED ORDER — SODIUM CHLORIDE 0.9 % IJ SOLN
INTRAMUSCULAR | Status: DC | PRN
  Administered 2017-03-22: 30 mL

## 2017-03-22 MED ORDER — HYDROCODONE-ACETAMINOPHEN 5-325 MG PO TABS
1.0000 | ORAL_TABLET | ORAL | Status: DC | PRN
  Administered 2017-03-22 – 2017-03-24 (×7): 2 via ORAL
  Filled 2017-03-22 (×6): qty 2

## 2017-03-22 MED ORDER — KETOROLAC TROMETHAMINE 15 MG/ML IJ SOLN
15.0000 mg | Freq: Four times a day (QID) | INTRAMUSCULAR | Status: AC
  Administered 2017-03-22 – 2017-03-23 (×4): 15 mg via INTRAVENOUS
  Filled 2017-03-22 (×3): qty 1

## 2017-03-22 MED ORDER — POLYETHYLENE GLYCOL 3350 17 G PO PACK: 17.0000 g | PACK | Freq: Every day | ORAL | Status: DC | PRN

## 2017-03-22 MED ORDER — PROPOFOL 10 MG/ML IV BOLUS
INTRAVENOUS | Status: DC | PRN
  Administered 2017-03-22: 30 mg via INTRAVENOUS

## 2017-03-22 MED ORDER — ROPIVACAINE HCL 5 MG/ML IJ SOLN
INTRAMUSCULAR | Status: DC | PRN
  Administered 2017-03-22: 30 mL via PERINEURAL

## 2017-03-22 MED ORDER — MENTHOL 3 MG MT LOZG: 1.0000 | LOZENGE | OROMUCOSAL | Status: DC | PRN

## 2017-03-22 MED ORDER — HYDROMORPHONE HCL 1 MG/ML IJ SOLN: 0.2500 mg | INTRAMUSCULAR | Status: DC | PRN

## 2017-03-22 MED ORDER — PHENYLEPHRINE HCL 10 MG/ML IJ SOLN
INTRAMUSCULAR | Status: DC | PRN
  Administered 2017-03-22: 80 ug via INTRAVENOUS
  Administered 2017-03-22: 40 ug via INTRAVENOUS

## 2017-03-22 MED ORDER — DEXAMETHASONE SODIUM PHOSPHATE 10 MG/ML IJ SOLN
INTRAMUSCULAR | Status: AC
  Filled 2017-03-22: qty 2

## 2017-03-22 MED ORDER — DEXAMETHASONE SODIUM PHOSPHATE 10 MG/ML IJ SOLN
INTRAMUSCULAR | Status: DC | PRN
  Administered 2017-03-22: 10 mg via INTRAVENOUS

## 2017-03-22 MED ORDER — METHOCARBAMOL 1000 MG/10ML IJ SOLN
500.0000 mg | Freq: Four times a day (QID) | INTRAVENOUS | Status: DC | PRN
  Filled 2017-03-22: qty 5

## 2017-03-22 MED ORDER — BUPIVACAINE HCL (PF) 0.5 % IJ SOLN
INTRAMUSCULAR | Status: DC | PRN
  Administered 2017-03-22: 3 mL via INTRATHECAL

## 2017-03-22 MED ORDER — ASPIRIN 81 MG PO CHEW
81.0000 mg | CHEWABLE_TABLET | Freq: Two times a day (BID) | ORAL | Status: DC
  Administered 2017-03-23 – 2017-03-24 (×3): 81 mg via ORAL
  Filled 2017-03-22 (×3): qty 1

## 2017-03-22 MED ORDER — METHOCARBAMOL 500 MG PO TABS
500.0000 mg | ORAL_TABLET | Freq: Four times a day (QID) | ORAL | Status: DC | PRN
  Administered 2017-03-22 – 2017-03-24 (×6): 500 mg via ORAL
  Filled 2017-03-22 (×5): qty 1

## 2017-03-22 MED ORDER — CHLORHEXIDINE GLUCONATE 4 % EX LIQD: 60.0000 mL | Freq: Once | CUTANEOUS | Status: DC

## 2017-03-22 MED ORDER — DEXAMETHASONE SODIUM PHOSPHATE 10 MG/ML IJ SOLN
10.0000 mg | Freq: Once | INTRAMUSCULAR | Status: AC
  Administered 2017-03-23: 10 mg via INTRAVENOUS
  Filled 2017-03-22: qty 1

## 2017-03-22 MED ORDER — ALUM & MAG HYDROXIDE-SIMETH 200-200-20 MG/5ML PO SUSP: 30.0000 mL | ORAL | Status: DC | PRN

## 2017-03-22 MED ORDER — DIPHENHYDRAMINE HCL 12.5 MG/5ML PO ELIX: 12.5000 mg | ORAL_SOLUTION | ORAL | Status: DC | PRN

## 2017-03-22 MED ORDER — ACETAMINOPHEN 650 MG RE SUPP: 650.0000 mg | Freq: Four times a day (QID) | RECTAL | Status: DC | PRN

## 2017-03-22 MED ORDER — MIDAZOLAM HCL 2 MG/2ML IJ SOLN
INTRAMUSCULAR | Status: AC
  Filled 2017-03-22: qty 2

## 2017-03-22 MED ORDER — BUPIVACAINE-EPINEPHRINE (PF) 0.5% -1:200000 IJ SOLN
INTRAMUSCULAR | Status: DC | PRN
  Administered 2017-03-22: 30 mL

## 2017-03-22 MED ORDER — METOCLOPRAMIDE HCL 5 MG/ML IJ SOLN: 5.0000 mg | Freq: Three times a day (TID) | INTRAMUSCULAR | Status: DC | PRN

## 2017-03-22 MED ORDER — METHOCARBAMOL 500 MG PO TABS
ORAL_TABLET | ORAL | Status: AC
  Filled 2017-03-22: qty 1

## 2017-03-22 MED ORDER — FENTANYL CITRATE (PF) 250 MCG/5ML IJ SOLN
INTRAMUSCULAR | Status: AC
  Filled 2017-03-22: qty 5

## 2017-03-22 MED ORDER — ONDANSETRON HCL 4 MG/2ML IJ SOLN
4.0000 mg | Freq: Four times a day (QID) | INTRAMUSCULAR | Status: DC | PRN
  Administered 2017-03-24: 4 mg via INTRAVENOUS
  Filled 2017-03-22: qty 2

## 2017-03-22 MED ORDER — ACETAMINOPHEN 325 MG PO TABS
650.0000 mg | ORAL_TABLET | Freq: Four times a day (QID) | ORAL | Status: DC | PRN
  Administered 2017-03-23 – 2017-03-24 (×2): 650 mg via ORAL
  Filled 2017-03-22 (×2): qty 2

## 2017-03-22 MED ORDER — POVIDONE-IODINE 10 % EX SWAB: 2.0000 "application " | Freq: Once | CUTANEOUS | Status: DC

## 2017-03-22 MED ORDER — KETOROLAC TROMETHAMINE 15 MG/ML IJ SOLN
INTRAMUSCULAR | Status: AC
  Filled 2017-03-22: qty 1

## 2017-03-22 MED ORDER — FENTANYL CITRATE (PF) 100 MCG/2ML IJ SOLN
INTRAMUSCULAR | Status: AC
  Filled 2017-03-22: qty 2

## 2017-03-22 MED ORDER — CEFAZOLIN SODIUM-DEXTROSE 2-4 GM/100ML-% IV SOLN
2.0000 g | Freq: Four times a day (QID) | INTRAVENOUS | Status: AC
Start: 2017-03-22 — End: 2017-03-23
  Administered 2017-03-22 – 2017-03-23 (×2): 2 g via INTRAVENOUS
  Filled 2017-03-22 (×2): qty 100

## 2017-03-22 MED ORDER — SODIUM CHLORIDE 0.9 % IV SOLN: INTRAVENOUS | Status: DC

## 2017-03-22 MED ORDER — MIDAZOLAM HCL 5 MG/5ML IJ SOLN
INTRAMUSCULAR | Status: DC | PRN
  Administered 2017-03-22: 1 mg via INTRAVENOUS

## 2017-03-22 MED ORDER — DOCUSATE SODIUM 100 MG PO CAPS
100.0000 mg | ORAL_CAPSULE | Freq: Two times a day (BID) | ORAL | Status: DC
  Administered 2017-03-23 – 2017-03-24 (×3): 100 mg via ORAL
  Filled 2017-03-22 (×3): qty 1

## 2017-03-22 MED ORDER — MIDAZOLAM HCL 2 MG/2ML IJ SOLN
INTRAMUSCULAR | Status: AC
  Administered 2017-03-22: 2 mg
  Filled 2017-03-22: qty 2

## 2017-03-22 MED ORDER — METOCLOPRAMIDE HCL 5 MG PO TABS: 5.0000 mg | ORAL_TABLET | Freq: Three times a day (TID) | ORAL | Status: DC | PRN

## 2017-03-22 MED ORDER — SENNA 8.6 MG PO TABS
2.0000 | ORAL_TABLET | Freq: Every day | ORAL | Status: DC
  Administered 2017-03-23: 17.2 mg via ORAL
  Filled 2017-03-22: qty 2

## 2017-03-22 MED ORDER — KETOROLAC TROMETHAMINE 30 MG/ML IJ SOLN
INTRAMUSCULAR | Status: AC
  Filled 2017-03-22: qty 1

## 2017-03-22 MED ORDER — HYDROMORPHONE HCL 1 MG/ML IJ SOLN
0.5000 mg | INTRAMUSCULAR | Status: DC | PRN
  Administered 2017-03-22: 1 mg via INTRAVENOUS
  Administered 2017-03-23: 2 mg via INTRAVENOUS
  Filled 2017-03-22: qty 2
  Filled 2017-03-22: qty 1

## 2017-03-22 MED ORDER — ONDANSETRON HCL 4 MG/2ML IJ SOLN
INTRAMUSCULAR | Status: DC | PRN
  Administered 2017-03-22: 4 mg via INTRAVENOUS

## 2017-03-22 MED ORDER — ONDANSETRON HCL 4 MG/2ML IJ SOLN
INTRAMUSCULAR | Status: AC
  Filled 2017-03-22: qty 2

## 2017-03-22 MED ORDER — LACTATED RINGERS IV SOLN: INTRAVENOUS | Status: DC

## 2017-03-22 MED ORDER — 0.9 % SODIUM CHLORIDE (POUR BTL) OPTIME
TOPICAL | Status: DC | PRN
  Administered 2017-03-22: 1000 mL

## 2017-03-22 MED ORDER — PROPOFOL 500 MG/50ML IV EMUL
INTRAVENOUS | Status: DC | PRN
  Administered 2017-03-22: 75 ug/kg/min via INTRAVENOUS

## 2017-03-22 MED ORDER — KETOROLAC TROMETHAMINE 30 MG/ML IJ SOLN
INTRAMUSCULAR | Status: DC | PRN
  Administered 2017-03-22: 30 mg via INTRA_ARTICULAR

## 2017-03-22 MED ORDER — PHENYLEPHRINE HCL 10 MG/ML IJ SOLN
INTRAVENOUS | Status: DC | PRN
  Administered 2017-03-22: 20 ug/min via INTRAVENOUS

## 2017-03-22 MED ORDER — LACTATED RINGERS IV SOLN
INTRAVENOUS | Status: DC | PRN
  Administered 2017-03-22 (×2): via INTRAVENOUS

## 2017-03-22 MED ORDER — SODIUM CHLORIDE 0.9 % IR SOLN
Status: DC | PRN
  Administered 2017-03-22: 3000 mL

## 2017-03-22 MED ORDER — PROMETHAZINE HCL 25 MG/ML IJ SOLN: 6.2500 mg | INTRAMUSCULAR | Status: DC | PRN

## 2017-03-22 MED ORDER — PHENOL 1.4 % MT LIQD: 1.0000 | OROMUCOSAL | Status: DC | PRN

## 2017-03-22 MED ORDER — BUPIVACAINE-EPINEPHRINE (PF) 0.5% -1:200000 IJ SOLN
INTRAMUSCULAR | Status: AC
  Filled 2017-03-22: qty 30

## 2017-03-22 MED ORDER — HYDROCODONE-ACETAMINOPHEN 5-325 MG PO TABS
ORAL_TABLET | ORAL | Status: AC
  Filled 2017-03-22: qty 2

## 2017-03-22 MED ORDER — EPHEDRINE SULFATE 50 MG/ML IJ SOLN
INTRAMUSCULAR | Status: DC | PRN
  Administered 2017-03-22: 5 mg via INTRAVENOUS

## 2017-03-22 MED ORDER — ONDANSETRON HCL 4 MG PO TABS: 4.0000 mg | ORAL_TABLET | Freq: Four times a day (QID) | ORAL | Status: DC | PRN

## 2017-03-22 MED ORDER — SODIUM CHLORIDE 0.9 % IR SOLN
Status: DC | PRN
  Administered 2017-03-22: 1000 mL

## 2017-03-22 SURGICAL SUPPLY — 48 items
ADH SKN CLS APL DERMABOND .7 (GAUZE/BANDAGES/DRESSINGS) ×1
ALCOHOL ISOPROPYL (RUBBING) (MISCELLANEOUS) ×2 IMPLANT
BANDAGE ACE 6X5 VEL STRL LF (GAUZE/BANDAGES/DRESSINGS) ×2 IMPLANT
BATTERY INSTRU NAVIGATION (MISCELLANEOUS) ×6 IMPLANT
BLADE SAW RECIP 87.9 MT (BLADE) ×2 IMPLANT
BTRY SRG DRVR LF (MISCELLANEOUS) ×3
CAPT KNEE TRIATH TK-4 ×2 IMPLANT
CHLORAPREP W/TINT 26ML (MISCELLANEOUS) ×4 IMPLANT
CUFF TOURNIQUET SINGLE 34IN LL (TOURNIQUET CUFF) ×2 IMPLANT
DERMABOND ADVANCED (GAUZE/BANDAGES/DRESSINGS) ×1
DERMABOND ADVANCED .7 DNX12 (GAUZE/BANDAGES/DRESSINGS) ×1 IMPLANT
DRAIN HEMOVAC 7FR (DRAIN) ×2 IMPLANT
DRAPE SHEET LG 3/4 BI-LAMINATE (DRAPES) ×4 IMPLANT
DRAPE U-SHAPE 47X51 STRL (DRAPES) ×2 IMPLANT
DRAPE UNIVERSAL PACK (DRAPES) ×2 IMPLANT
DRSG AQUACEL AG ADV 3.5X10 (GAUZE/BANDAGES/DRESSINGS) ×2 IMPLANT
DRSG AQUACEL AG ADV 3.5X14 (GAUZE/BANDAGES/DRESSINGS) ×2 IMPLANT
DRSG TEGADERM 2-3/8X2-3/4 SM (GAUZE/BANDAGES/DRESSINGS) ×2 IMPLANT
ELECT REM PT RETURN 9FT ADLT (ELECTROSURGICAL) ×2
ELECTRODE REM PT RTRN 9FT ADLT (ELECTROSURGICAL) ×1 IMPLANT
EVACUATOR 1/8 PVC DRAIN (DRAIN) IMPLANT
GLOVE BIO SURGEON STRL SZ8.5 (GLOVE) ×4 IMPLANT
GLOVE BIOGEL PI IND STRL 8.5 (GLOVE) ×1 IMPLANT
GLOVE BIOGEL PI INDICATOR 8.5 (GLOVE) ×1
GOWN STRL REUS W/TWL 2XL LVL3 (GOWN DISPOSABLE) ×2 IMPLANT
HANDPIECE INTERPULSE COAX TIP (DISPOSABLE) ×2
KIT BASIN OR (CUSTOM PROCEDURE TRAY) ×2 IMPLANT
MANIFOLD NEPTUNE II (INSTRUMENTS) ×2 IMPLANT
NEEDLE SPNL 18GX3.5 QUINCKE PK (NEEDLE) ×4 IMPLANT
PACK TOTAL JOINT (CUSTOM PROCEDURE TRAY) ×2 IMPLANT
PACK TOTAL KNEE CUSTOM (KITS) ×2 IMPLANT
PADDING CAST COTTON 6X4 STRL (CAST SUPPLIES) ×4 IMPLANT
SAW OSC TIP CART 19.5X105X1.3 (SAW) ×2 IMPLANT
SEALER BIPOLAR AQUA 6.0 (INSTRUMENTS) ×2 IMPLANT
SET HNDPC FAN SPRY TIP SCT (DISPOSABLE) ×1 IMPLANT
SET PAD KNEE POSITIONER (MISCELLANEOUS) ×2 IMPLANT
SUT ETHIBOND NAB CT1 #1 30IN (SUTURE) ×4 IMPLANT
SUT MNCRL AB 3-0 PS2 27 (SUTURE) ×2 IMPLANT
SUT MON AB 2-0 CT1 36 (SUTURE) ×4 IMPLANT
SUT VIC AB 1 CTX 27 (SUTURE) ×2 IMPLANT
SUT VIC AB 2-0 CT1 27 (SUTURE) ×2
SUT VIC AB 2-0 CT1 TAPERPNT 27 (SUTURE) ×1 IMPLANT
SUT VLOC 180 0 24IN GS25 (SUTURE) ×2 IMPLANT
SYR 50ML LL SCALE MARK (SYRINGE) ×4 IMPLANT
TOWER CARTRIDGE SMART MIX (DISPOSABLE) ×2 IMPLANT
TRAY CATH 16FR W/PLASTIC CATH (SET/KITS/TRAYS/PACK) IMPLANT
TRAY FOLEY CATH 14FR (SET/KITS/TRAYS/PACK) ×2 IMPLANT
WRAP KNEE MAXI GEL POST OP (GAUZE/BANDAGES/DRESSINGS) ×2 IMPLANT

## 2017-03-22 NOTE — Discharge Instructions (Signed)
° °Dr. Amera Banos °Total Joint Specialist °[facility] °[address] °[place], [place] [address] °[phone] ° °TOTAL KNEE REPLACEMENT POSTOPERATIVE DIRECTIONS ° ° ° °Knee Rehabilitation, Guidelines Following Surgery  °Results after knee surgery are often greatly improved when you follow the exercise, range of motion and muscle strengthening exercises prescribed by your doctor. Safety measures are also important to protect the knee from further injury. Any time any of these exercises cause you to have increased pain or swelling in your knee joint, decrease the amount until you are comfortable again and slowly increase them. If you have problems or questions, call your caregiver or physical therapist for advice.  ° °WEIGHT BEARING °Weight bearing as tolerated with assist device (walker, cane, etc) as directed, use it as long as suggested by your surgeon or therapist, typically at least 4-6 weeks. ° °HOME CARE INSTRUCTIONS  °Remove items at home which could result in a fall. This includes throw rugs or furniture in walking pathways.  °Continue medications as instructed at time of discharge. °You may have some home medications which will be placed on hold until you complete the course of blood thinner medication.  °You may start showering once you are discharged home but do not submerge the incision under water. Just pat the incision dry and apply a dry gauze dressing on daily. °Walk with walker as instructed.  °You may resume a sexual relationship in one month or when given the OK by your doctor.  °· Use walker as long as suggested by your caregivers. °· Avoid periods of inactivity such as sitting longer than an hour when not asleep. This helps prevent blood clots.  °You may put full weight on your legs and walk as much as is comfortable.  °You may return to work once you are cleared by your doctor.  °Do not drive a car for 6 weeks or until released by you surgeon.  °· Do not drive  while taking narcotics.  °Wear the elastic stockings for three weeks following surgery during the day but you may remove then at night. °Make sure you keep all of your appointments after your operation with all of your doctors and caregivers. You should call the office at the above phone number and make an appointment for approximately two weeks after the date of your surgery. °Do not remove your surgical dressing. The dressing is waterproof; you may take showers in 3 days, but do not take tub baths or submerge the dressing. °Please pick up a stool softener and laxative for home use as long as you are requiring pain medications. °· ICE to the affected knee every three hours for 30 minutes at a time and then as needed for pain and swelling.  Continue to use ice on the knee for pain and swelling from surgery. You may notice swelling that will progress down to the foot and ankle.  This is normal after surgery.  Elevate the leg when you are not up walking on it.   °It is important for you to complete the blood thinner medication as prescribed by your doctor. °· Continue to use the breathing machine which will help keep your temperature down.  It is common for your temperature to cycle up and down following surgery, especially at night when you are not up moving around and exerting yourself.  The breathing machine keeps your lungs expanded and your temperature down. ° °RANGE OF MOTION AND STRENGTHENING EXERCISES  °Rehabilitation of the knee is important following   a knee injury or an operation. After just a few days of immobilization, the muscles of the thigh which control the knee become weakened and shrink (atrophy). Knee exercises are designed to build up the tone and strength of the thigh muscles and to improve knee motion. Often times heat used for twenty to thirty minutes before working out will loosen up your tissues and help with improving the range of motion but do not use heat for the first two weeks following  surgery. These exercises can be done on a training (exercise) mat, on the floor, on a table or on a bed. Use what ever works the best and is most comfortable for you Knee exercises include:  °Leg Lifts - While your knee is still immobilized in a splint or cast, you can do straight leg raises. Lift the leg to 60 degrees, hold for 3 sec, and slowly lower the leg. Repeat 10-20 times 2-3 times daily. Perform this exercise against resistance later as your knee gets better.  °Quad and Hamstring Sets - Tighten up the muscle on the front of the thigh (Quad) and hold for 5-10 sec. Repeat this 10-20 times hourly. Hamstring sets are done by pushing the foot backward against an object and holding for 5-10 sec. Repeat as with quad sets.  °A rehabilitation program following serious knee injuries can speed recovery and prevent re-injury in the future due to weakened muscles. Contact your doctor or a physical therapist for more information on knee rehabilitation.  ° °SKILLED REHAB INSTRUCTIONS: °If the patient is transferred to a skilled rehab facility following release from the hospital, a list of the current medications will be sent to the facility for the patient to continue.  When discharged from the skilled rehab facility, please have the facility set up the patient's Home Health Physical Therapy prior to being released. Also, the skilled facility will be responsible for providing the patient with their medications at time of release from the facility to include their pain medication, the muscle relaxants, and their blood thinner medication. If the patient is still at the rehab facility at time of the two week follow up appointment, the skilled rehab facility will also need to assist the patient in arranging follow up appointment in our office and any transportation needs. ° °MAKE SURE YOU:  °Understand these instructions.  °Will watch your condition.  °Will get help right away if you are not doing well or get worse.   ° ° °Pick up stool softner and laxative for home use following surgery while on pain medications. °Do NOT remove your dressing. You may shower.  °Do not take tub baths or submerge incision under water. °May shower starting three days after surgery. °Please use a clean towel to pat the incision dry following showers. °Continue to use ice for pain and swelling after surgery. °Do not use any lotions or creams on the incision until instructed by your surgeon. ° °

## 2017-03-22 NOTE — Anesthesia Procedure Notes (Signed)
Anesthesia Procedure Image    

## 2017-03-22 NOTE — Anesthesia Procedure Notes (Signed)
Anesthesia Regional Block: Adductor canal block   Pre-Anesthetic Checklist: ,, timeout performed, Correct Patient, Correct Site, Correct Laterality, Correct Procedure, Correct Position, site marked, Risks and benefits discussed,  Surgical consent,  Pre-op evaluation,  At surgeon's request and post-op pain management  Laterality: Left  Prep: chloraprep       Needles:  Injection technique: Single-shot  Needle Type: Echogenic Needle     Needle Length: 9cm      Additional Needles:   Procedures: ultrasound guided,,,,,,,,  Narrative:  Start time: 03/22/2017 12:40 PM End time: 03/22/2017 12:52 PM Injection made incrementally with aspirations every 5 mL.  Performed by: Personally  Anesthesiologist: Quyen Cutsforth  Additional Notes: Patient tolerated the procedure well without complications

## 2017-03-22 NOTE — Interval H&P Note (Signed)
History and Physical Interval Note:  03/22/2017 11:32 AM  Caitlin Wagner  has presented today for surgery, with the diagnosis of left knee DJD  The various methods of treatment have been discussed with the patient and family. After consideration of risks, benefits and other options for treatment, the patient has consented to  Procedure(s): TOTAL KNEE ARTHROPLASTY w/ Navigation (Left) as a surgical intervention .  The patient's history has been reviewed, patient examined, no change in status, stable for surgery.  I have reviewed the patient's chart and labs.  Questions were answered to the patient's satisfaction.     Garth Diffley, Cloyde Reams

## 2017-03-22 NOTE — Anesthesia Postprocedure Evaluation (Signed)
Anesthesia Post Note  Patient: Caitlin Wagner  Procedure(s) Performed: Procedure(s) (LRB): TOTAL KNEE ARTHROPLASTY w/ Navigation (Left)     Patient location during evaluation: PACU Anesthesia Type: Spinal Level of consciousness: oriented and awake and alert Pain management: pain level controlled Vital Signs Assessment: post-procedure vital signs reviewed and stable Respiratory status: spontaneous breathing, respiratory function stable and patient connected to nasal cannula oxygen Cardiovascular status: blood pressure returned to baseline and stable Postop Assessment: no headache and no backache Anesthetic complications: no    Last Vitals:  Vitals:   03/22/17 1345 03/22/17 1645  BP: 94/62 94/62  Pulse:  64  Resp:  13  Temp: 36.4 C 36.4 C    Last Pain:  Vitals:   03/22/17 1011  TempSrc:   PainSc: 3                  Dezhane Staten S

## 2017-03-22 NOTE — Anesthesia Preprocedure Evaluation (Addendum)
Anesthesia Evaluation  Patient identified by MRN, date of birth, ID band Patient awake    Reviewed: Allergy & Precautions, NPO status , Patient's Chart, lab work & pertinent test results  Airway Mallampati: I  TM Distance: >3 FB Neck ROM: Full    Dental no notable dental hx.    Pulmonary neg pulmonary ROS, former smoker,    Pulmonary exam normal breath sounds clear to auscultation       Cardiovascular Exercise Tolerance: Good negative cardio ROS Normal cardiovascular exam Rhythm:Regular Rate:Normal     Neuro/Psych negative neurological ROS  negative psych ROS   GI/Hepatic negative GI ROS, Neg liver ROS,   Endo/Other  negative endocrine ROS  Renal/GU negative Renal ROS  negative genitourinary   Musculoskeletal  (+) Arthritis , Rheumatoid disorders,    Abdominal   Peds negative pediatric ROS (+)  Hematology negative hematology ROS (+)   Anesthesia Other Findings hcg negative  Reproductive/Obstetrics negative OB ROS                          Anesthesia Physical Anesthesia Plan  ASA: II  Anesthesia Plan: Spinal and Regional   Post-op Pain Management:  Regional for Post-op pain   Induction: Intravenous  Airway Management Planned:   Additional Equipment:   Intra-op Plan:   Post-operative Plan: Extubation in OR  Informed Consent: I have reviewed the patients History and Physical, chart, labs and discussed the procedure including the risks, benefits and alternatives for the proposed anesthesia with the patient or authorized representative who has indicated his/her understanding and acceptance.   Dental advisory given  Plan Discussed with: CRNA and Surgeon  Anesthesia Plan Comments:        Anesthesia Quick Evaluation

## 2017-03-22 NOTE — Anesthesia Procedure Notes (Signed)
Spinal  Patient location during procedure: OR Start time: 03/22/2017 2:30 PM End time: 03/22/2017 2:30 PM Staffing Anesthesiologist: Saia Derossett, Iona Beard Performed: anesthesiologist  Preanesthetic Checklist Completed: patient identified, site marked, surgical consent, pre-op evaluation, timeout performed, IV checked, risks and benefits discussed and monitors and equipment checked Spinal Block Patient position: sitting Prep: Betadine Patient monitoring: heart rate, continuous pulse ox and blood pressure Injection technique: single-shot Needle Needle type: Sprotte  Needle gauge: 24 G Needle length: 9 cm Additional Notes Expiration date of kit checked and confirmed. Patient tolerated procedure well, without complications.

## 2017-03-22 NOTE — Progress Notes (Signed)
Pt. Restless, pulse irregular at 60- 112bpm. Dr. Okey Dupre called & he came in to see pt. Pt. Still quite sedated but responding readily to remarks. No complaints voiced by pt.

## 2017-03-22 NOTE — Transfer of Care (Signed)
Immediate Anesthesia Transfer of Care Note  Patient: Caitlin Wagner  Procedure(s) Performed: Procedure(s): TOTAL KNEE ARTHROPLASTY w/ Navigation (Left)  Patient Location: PACU  Anesthesia Type:MAC, Regional and Spinal  Level of Consciousness: awake, alert , oriented and patient cooperative  Airway & Oxygen Therapy: Patient Spontanous Breathing and Patient connected to nasal cannula oxygen  Post-op Assessment: Report given to RN and Post -op Vital signs reviewed and stable  Post vital signs: Reviewed and stable  Last Vitals:  Vitals:   03/22/17 0957 03/22/17 1255  BP: 92/64 95/66  Pulse: 77 (!) 47  Resp: 18 (!) 22  Temp: 37.1 C     Last Pain:  Vitals:   03/22/17 1011  TempSrc:   PainSc: 3       Patients Stated Pain Goal: 5 (08/17/12 1438)  Complications: No apparent anesthesia complications

## 2017-03-22 NOTE — H&P (View-Only) (Signed)
TOTAL KNEE ADMISSION H&P  Patient is being admitted for left total knee arthroplasty.  Subjective:  Chief Complaint:left knee pain.  HPI: Caitlin Wagner, 51 y.o. female, has a history of pain and functional disability in the left knee due to arthritis and has failed non-surgical conservative treatments for greater than 12 weeks to includeNSAID's and/or analgesics, corticosteriod injections, flexibility and strengthening excercises, use of assistive devices, weight reduction as appropriate and activity modification.  Onset of symptoms was gradual, starting >10 years ago with gradually worsening course since that time. The patient noted no past surgery on the left knee(s).  Patient currently rates pain in the left knee(s) at 10 out of 10 with activity. Patient has night pain, worsening of pain with activity and weight bearing, pain that interferes with activities of daily living, pain with passive range of motion, crepitus and joint swelling.  Patient has evidence of subchondral cysts, subchondral sclerosis, periarticular osteophytes, joint subluxation and joint space narrowing by imaging studies. There is no active infection.  Patient Active Problem List   Diagnosis Date Noted  . Rheumatoid arthritis(714.0)    Past Medical History:  Diagnosis Date  . 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 Rheumatoid arthritis(714.0)     Past Surgical History:  Procedure Laterality Date  . feet reconstruction surgery     bilateral   . TONSILLECTOMY  age 62     (Not in a hospital admission) Allergies  Allergen Reactions  . Remicade [Infliximab] Anaphylaxis  . Codeine     Extreme feeling of something crawling    Social History  Substance Use Topics  . Smoking status: Former Smoker    Packs/day: 0.30  .  Smokeless tobacco: Never Used  . Alcohol use No    Family History  Problem Relation Age of Onset  . Heart disease Father        heart attack at age 83  . Diverticulitis Father   . Diabetes Paternal Grandmother   . Heart disease Paternal Grandfather   . Heart failure Maternal Grandmother   . Cancer Maternal Grandfather        ? type     Review of Systems  Constitutional: Negative.   HENT: Negative.   Eyes: Negative.   Respiratory: Negative.   Cardiovascular: Negative.   Gastrointestinal: Negative.   Musculoskeletal: Positive for joint pain.  Skin: Negative.   Neurological: Negative.   Endo/Heme/Allergies: Positive for environmental allergies.  Psychiatric/Behavioral: Negative.     Objective:  Physical Exam  Vitals reviewed. Constitutional: She is oriented to person, place, and time. She appears well-developed.  HENT:  Head: Normocephalic and atraumatic.  Eyes: Conjunctivae and EOM are normal. Pupils are equal, round, and reactive to light.  Neck: Normal range of motion. Neck supple.  Cardiovascular: Normal rate, regular rhythm and intact distal pulses.   Respiratory: She is in respiratory distress.  GI: Soft. She exhibits no distension.  Genitourinary:  Genitourinary Comments: deferred  Musculoskeletal:       Left knee: She exhibits swelling, effusion and ecchymosis. Tenderness found. Medial joint line and lateral joint line tenderness noted.  Neurological: She is alert and oriented to person, place, and time. She has normal reflexes.  Skin: Skin is warm and dry.  Psychiatric: She has a normal mood and affect. Her behavior is normal. Judgment and thought  content normal.    Vital signs in last 24 hours: @VSRANGES@  Labs:   Estimated body mass index is 28.34 kg/m as calculated from the following:   Height as of 11/16/16: 5' 1" (1.549 m).   Weight as of 11/16/16: 68 kg (150 lb).   Imaging Review Plain radiographs demonstrate severe degenerative joint disease  of the left knee(s). The overall alignment issignificant valgus. The bone quality appears to be adequate for age and reported activity level.  Assessment/Plan:  End stage arthritis, left knee   The patient history, physical examination, clinical judgment of the provider and imaging studies are consistent with end stage degenerative joint disease of the left knee(s) and total knee arthroplasty is deemed medically necessary. The treatment options including medical management, injection therapy arthroscopy and arthroplasty were discussed at length. The risks and benefits of total knee arthroplasty were presented and reviewed. The risks due to aseptic loosening, infection, stiffness, patella tracking problems, thromboembolic complications and other imponderables were discussed. The patient acknowledged the explanation, agreed to proceed with the plan and consent was signed. Patient is being admitted for inpatient treatment for surgery, pain control, PT, OT, prophylactic antibiotics, VTE prophylaxis, progressive ambulation and ADL's and discharge planning. The patient is planning to be discharged home with home health services  

## 2017-03-22 NOTE — Op Note (Signed)
OPERATIVE REPORT  SURGEON: Samson Frederic, MD   ASSISTANT: Hart Carwin, RNFA.  PREOPERATIVE DIAGNOSIS: Left knee arthritis.   POSTOPERATIVE DIAGNOSIS: Left knee arthritis.   PROCEDURE: Left total knee arthroplasty.   IMPLANTS: Stryker Triathlon CR femur, size 3. Stryker Tritanium tibia, size 3. X3 polyethelyene insert, size 11 mm, CR. 3 button asymmetric patella, size 32 mm.  ANESTHESIA:  Regional and Spinal  TOURNIQUET TIME: Not utilized.   ESTIMATED BLOOD LOSS: 250 mL.   ANTIBIOTICS: 2 g Ancef.  DRAINS: None.  COMPLICATIONS: None   CONDITION: PACU - hemodynamically stable.   BRIEF CLINICAL NOTE: Caitlin Wagner is a 51 y.o. female with a long-standing history of Left knee arthritis. After failing conservative management, the patient was indicated for total knee arthroplasty. The risks, benefits, and alternatives to the procedure were explained, and the patient elected to proceed.  PROCEDURE IN DETAIL: Adductor canal block was obtained in the pre-op holding area. Once inside the operative room, spinal anesthesia was obtained, and a foley catheter was inserted. The patient was then positioned, a nonsterile tourniquet was placed, and the lower extremity was prepped and draped in the normal sterile surgical fashion. A time-out was called verifying side and site of surgery. The patient received IV antibiotics within 60 minutes of beginning the procedure. The tourniquet was not utilized.  An anterior approach to the knee was performed utilizing a midvastus arthrotomy. A medial release was performed and the patellar fat pad was excised. Stryker navigation was used to cut the distal femur perpendicular to the mechanical axis. A freehand patellar resection was performed, and the patella was sized an prepared with 3 lug holes.  Nagivation was used to make a neutral proximal tibia  resection, taking 1 mm of bone from the less affected medial side with 3 degrees of slope. The menisci were excised. A spacer block was placed, and the alignment and balance in extension were confirmed.   The distal femur was sized using the 3-degree external rotation guide referencing the posterior femoral cortex. The appropriate 4-in-1 cutting block was pinned into place. Rotation was checked using Whiteside's line, the epicondylar axis, and then confirmed with a spacer block in flexion. The remaining femoral cuts were performed, taking care to protect the MCL.  The tibia was sized and the trial tray was pinned into place. The remaining trail components were inserted. The knee was stable to varus and valgus stress through a full range of motion. The patella tracked centrally, and the PCL was well balanced. The trial components were removed, and the proximal tibial surface was prepared. Final components were impacted into place. The knee was tested for a final time and found to be well balanced.  The wound was copiously irrigated with a dilute betadine solution followed by normal saline with pulse lavage. Marcaine solution was injected into the periarticular soft tissue. The wound was closed in layers using #1 Ethibond and V-Loc for the fascia, 2-0 Vicryl for the subcutaneous fat, 2-0 Monocryl for the deep dermal layer, 3-0 running Monocryl subcuticular Stitch, and Dermabond for the skin. Once the glue was fully dried, an Aquacell Ag and compressive dressing were applied. Tthe patient was transported to the recovery room in stable condition. Sponge, needle, and instrument counts were correct at the end of the case x2. The patient tolerated the procedure well and there were no known complications.

## 2017-03-23 ENCOUNTER — Encounter (HOSPITAL_COMMUNITY): Payer: Self-pay | Admitting: Orthopedic Surgery

## 2017-03-23 LAB — BASIC METABOLIC PANEL
Anion gap: 10 (ref 5–15)
BUN: 10 mg/dL (ref 6–20)
CO2: 21 mmol/L — ABNORMAL LOW (ref 22–32)
Calcium: 8.2 mg/dL — ABNORMAL LOW (ref 8.9–10.3)
Chloride: 105 mmol/L (ref 101–111)
Creatinine, Ser: 0.54 mg/dL (ref 0.44–1.00)
GFR calc Af Amer: >60 mL/min (ref >60)
GFR calc non Af Amer: >60 mL/min (ref >60)
Glucose, Bld: 103 mg/dL — ABNORMAL HIGH (ref 65–99)
Potassium: 4.4 mmol/L (ref 3.5–5.1)
Sodium: 136 mmol/L (ref 135–145)

## 2017-03-23 LAB — CBC
HCT: 35.3 % — ABNORMAL LOW (ref 36.0–46.0)
Hemoglobin: 11.2 g/dL — ABNORMAL LOW (ref 12.0–15.0)
MCH: 28.9 pg (ref 26.0–34.0)
MCHC: 31.7 g/dL (ref 30.0–36.0)
MCV: 91 fL (ref 78.0–100.0)
Platelets: 196 10*3/uL (ref 150–400)
RBC: 3.88 MIL/uL (ref 3.87–5.11)
RDW: 14.4 % (ref 11.5–15.5)
WBC: 10.9 10*3/uL — ABNORMAL HIGH (ref 4.0–10.5)

## 2017-03-23 MED ORDER — HYDROCODONE-ACETAMINOPHEN 5-325 MG PO TABS: 1.0000 | ORAL_TABLET | ORAL | 0 refills | Status: DC | PRN

## 2017-03-23 MED ORDER — OXYCODONE HCL ER 10 MG PO T12A
10.0000 mg | EXTENDED_RELEASE_TABLET | Freq: Two times a day (BID) | ORAL | Status: DC
  Administered 2017-03-23 – 2017-03-24 (×3): 10 mg via ORAL
  Filled 2017-03-23 (×3): qty 1

## 2017-03-23 MED ORDER — DOCUSATE SODIUM 100 MG PO CAPS: 100.0000 mg | ORAL_CAPSULE | Freq: Two times a day (BID) | ORAL | 1 refills | Status: DC

## 2017-03-23 MED ORDER — ONDANSETRON HCL 4 MG PO TABS: 4.0000 mg | ORAL_TABLET | Freq: Four times a day (QID) | ORAL | 0 refills | Status: DC | PRN

## 2017-03-23 MED ORDER — ASPIRIN 81 MG PO CHEW: 81.0000 mg | CHEWABLE_TABLET | Freq: Two times a day (BID) | ORAL | 1 refills | Status: DC

## 2017-03-23 MED ORDER — OXYCODONE HCL ER 10 MG PO T12A: 10.0000 mg | EXTENDED_RELEASE_TABLET | ORAL | 0 refills | Status: DC

## 2017-03-23 MED ORDER — SENNA 8.6 MG PO TABS: 2.0000 | ORAL_TABLET | Freq: Every day | ORAL | 0 refills | Status: DC

## 2017-03-23 NOTE — Progress Notes (Signed)
Physical Therapy Treatment Patient Details Name: Caitlin Wagner MRN: [id number] DOB: 1966/07/06 Today's Date: 03/23/2017    History of Present Illness Admitted for LTKA, Caitlin Wagner;  has a past medical history of Arthritis; and Rheumatoid arthritis(714.0).    PT Comments    Continuing work on functional mobility and activity tolerance; Making excellent progress, with significantly incr amb distance, and stair training complete; on track for dc home tomorrow  Follow Up Recommendations  Home health PT;Supervision - Intermittent     Equipment Recommendations  None recommended by PT    Recommendations for Other Services       Precautions / Restrictions Precautions Precautions: Knee Precaution Booklet Issued: No Precaution Comments: Pt educated to not allow any pillow or bolster under knee for healing with optimal range of motion.  Restrictions Weight Bearing Restrictions: Yes LLE Weight Bearing: Weight bearing as tolerated    Mobility  Bed Mobility               General bed mobility comments: OOB in chair on OT arrival  Transfers Overall transfer level: Needs assistance Equipment used: Rolling walker (2 wheeled) Transfers: Sit to/from Stand Sit to Stand: Supervision         General transfer comment: Pt able to complete without placing hands on RW this session.   Ambulation/Gait Ambulation/Gait assistance: Supervision Ambulation Distance (Feet): 200 Feet (at least) Assistive device: Rolling walker (2 wheeled) Gait Pattern/deviations: Step-through pattern (very close to normal gait pattern)     General Gait Details: Cues to activate quad for L stance stability   Stairs Stairs: Yes   Stair Management: No rails;With walker;Step to pattern;Forwards;Backwards Number of Stairs: 1 (x2) General stair comments: Cues for sequence; overall little difficulty  Wheelchair Mobility    Modified Rankin (Stroke Patients Only)       Balance Overall balance assessment:  Needs assistance Sitting-balance support: No upper extremity supported;Feet supported Sitting balance-[name] Scale: Good     Standing balance support: Bilateral upper extremity supported;No upper extremity supported;During functional activity Standing balance-[name] Scale: Fair Standing balance comment: ABle to statically stand at sink for grooming tasks without UE support.                             Cognition Arousal/Alertness: Awake/alert Behavior During Therapy: WFL for tasks assessed/performed Overall Cognitive Status: Within Functional Limits for tasks assessed                                        Exercises Total Joint Exercises Long Arc Quad: AROM;Left;20 reps;Seated Knee Flexion: AROM;Left;20 reps;Seated Goniometric ROM: approx 0-95deg    General Comments        Pertinent Vitals/Pain Pain Assessment: 0-10 Pain Score: 3  Faces Pain Scale: Hurts a little bit Pain Location: L knee Pain Descriptors / Indicators: Aching Pain Intervention(s): Monitored during session; Ice applied    Home Living Family/patient expects to be discharged to:: Private residence Living Arrangements: Spouse/significant other Available Help at Discharge: Family;Available 24 hours/day Type of Home: House Home Access: Stairs to enter Entrance Stairs-Rails: None Home Layout: Two level;1/2 bath on main level;Bed/bath upstairs Home Equipment: Walker - 2 wheels;Bedside commode;Grab bars - tub/shower (grab bars are the temporary type)      Prior Function Level of Independence: Independent      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   PT Goals (current  goals can now be found in the care plan section) Acute Rehab PT Goals Patient Stated Goal: walk in the partk without pain PT Goal Formulation: With patient Time For Goal Achievement: 04/06/17 Potential to Achieve Goals: Good Progress towards PT goals: Progressing toward goals    Frequency    7X/week       PT Plan Current plan remains appropriate    Co-evaluation              AM-PAC PT "6 Clicks" Daily Activity  Outcome Measure  Difficulty turning over in bed (including adjusting bedclothes, sheets and blankets)?: None Difficulty moving from lying on back to sitting on the side of the bed? : None Difficulty sitting down on and standing up from a chair with arms (e.g., wheelchair, bedside commode, etc,.)?: None Help needed moving to and from a bed to chair (including a wheelchair)?: None Help needed walking in hospital room?: None Help needed climbing 3-5 steps with a railing? : A Little 6 Click Score: 23    End of Session Equipment Utilized During Treatment: Gait belt Activity Tolerance: Patient tolerated treatment well Patient left: in chair;with call bell/phone within reach;with family/visitor present Nurse Communication: Mobility status PT Visit Diagnosis: Pain Pain - Right/Left: Left Pain - part of body: Knee     Time: 2025-4270 PT Time Calculation (min) (ACUTE ONLY): 37 min  Charges:  $Gait Training: 8-22 mins $Therapeutic Exercise: 8-22 mins                    G Codes:       Caitlin Wagner, PT  Acute Rehabilitation Services Pager [phone] Office [phone]    Caitlin Wagner 03/23/2017, 2:30 PM

## 2017-03-23 NOTE — Evaluation (Signed)
Physical Therapy Evaluation Patient Details Name: Caitlin Wagner MRN: [id number] DOB: [birth year] Today's Date: 03/23/2017   History of Present Illness  Admitted for LTKA, Caitlin Wagner;  has a past medical history of Arthritis; and Rheumatoid arthritis(714.0).  Clinical Impression  Pt is s/p TKA resulting in the deficits listed below (see PT Problem List). Overall moving quite well; I anticipate good progress;  Pt will benefit from skilled PT to increase their independence and safety with mobility to allow discharge to the venue listed below.      Follow Up Recommendations Home health PT;Supervision - Intermittent    Equipment Recommendations  None recommended by PT    Recommendations for Other Services OT consult     Precautions / Restrictions Precautions Precautions: Knee Precaution Booklet Issued: Yes (comment) Precaution Comments: Pt educated to not allow any pillow or bolster under knee for healing with optimal range of motion.  Restrictions Weight Bearing Restrictions: Yes LLE Weight Bearing: Weight bearing as tolerated      Mobility  Bed Mobility Overal bed mobility: Needs Assistance Bed Mobility: Supine to Sit     Supine to sit: Supervision     General bed mobility comments: Cues for technique  Transfers Overall transfer level: Needs assistance Equipment used: Rolling walker (2 wheeled) Transfers: Sit to/from Stand Sit to Stand: Supervision         General transfer comment: Cues for hand placement; Caitlin Wagner requests to put forearms on RW due to RA changes in hands -- this is not unreasonable, but did warn her of the risk of RW kicking back if she pulls up with bil UEs  Ambulation/Gait Ambulation/Gait assistance: Supervision Ambulation Distance (Feet): 120 Feet Assistive device: Rolling walker (2 wheeled) Gait Pattern/deviations: Step-through pattern (emerging)     General Gait Details: Cues to activate quad for L stance stability  Stairs             Wheelchair Mobility    Modified Rankin (Stroke Patients Only)       Balance                                             Pertinent Vitals/Pain Pain Assessment: 0-10 Pain Score: 3  Pain Location: L knee Pain Descriptors / Indicators: Aching Pain Intervention(s): Monitored during session    Home Living Family/patient expects to be discharged to:: Private residence Living Arrangements: Spouse/significant other Available Help at Discharge: Family;Available 24 hours/day Type of Home: House Home Access: Stairs to enter Entrance Stairs-Rails: None Entrance Stairs-Number of Steps: 2 (1+1) Home Layout: Two level;1/2 bath on main level;Bed/bath upstairs Home Equipment: Walker - 2 wheels;Bedside commode      Prior Function Level of Independence: Independent         Comments: Works as a Runner, broadcasting/film/video at a Therapist, art        Extremity/Trunk Assessment   Upper Extremity Assessment Upper Extremity Assessment: Defer to OT evaluation (noted RA changes in hands)    Lower Extremity Assessment Lower Extremity Assessment: LLE deficits/detail LLE Deficits / Details: Grossly decr strength postop; good quad set       Communication   Communication: No difficulties  Cognition Arousal/Alertness: Awake/alert Behavior During Therapy: WFL for tasks assessed/performed Overall Cognitive Status: Within Functional Limits for tasks assessed  General Comments      Exercises Total Joint Exercises Quad Sets: AROM;Left;10 reps Heel Slides: AROM;Left;10 reps Straight Leg Raises: AROM;Left;10 reps Goniometric ROM: approx 4-92deg   Assessment/Plan    PT Assessment Patient needs continued PT services  PT Problem List Decreased strength;Decreased range of motion;Decreased activity tolerance;Decreased balance;Decreased mobility;Decreased knowledge of use of DME;Decreased knowledge of  precautions;Pain       PT Treatment Interventions DME instruction;Gait training;Stair training;Functional mobility training;Therapeutic activities;Therapeutic exercise;Patient/family education    PT Goals (Current goals can be found in the Care Plan section)  Acute Rehab PT Goals Patient Stated Goal: decr pain with amb; plans to have other knee done PT Goal Formulation: With patient Time For Goal Achievement: 04/06/17 Potential to Achieve Goals: Good    Frequency 7X/week   Barriers to discharge        Co-evaluation               AM-PAC PT "6 Clicks" Daily Activity  Outcome Measure Difficulty turning over in bed (including adjusting bedclothes, sheets and blankets)?: None Difficulty moving from lying on back to sitting on the side of the bed? : None Difficulty sitting down on and standing up from a chair with arms (e.g., wheelchair, bedside commode, etc,.)?: None Help needed moving to and from a bed to chair (including a wheelchair)?: None Help needed walking in hospital room?: None Help needed climbing 3-5 steps with a railing? : A Little 6 Click Score: 23    End of Session Equipment Utilized During Treatment: Gait belt Activity Tolerance: Patient tolerated treatment well Patient left: in chair;with call bell/phone within reach;with family/visitor present Nurse Communication: Mobility status PT Visit Diagnosis: Pain Pain - Right/Left: Left Pain - part of body: Knee    Time: 2671-2458 (minus approx 5 minutes to get short RW) PT Time Calculation (min) (ACUTE ONLY): 42 min   Charges:   PT Evaluation $PT Eval Low Complexity: 1 Procedure PT Treatments $Gait Training: 8-22 mins   PT G Codes:        Caitlin Wagner, PT  Acute Rehabilitation Services Pager [phone] Office [phone]   Caitlin Wagner 03/23/2017, 11:01 AM

## 2017-03-23 NOTE — Care Management Note (Signed)
Case Management Note  Patient Details  Name: Caitlin Wagner MRN: [id number] Date of Birth: Jul 13, 1966  Subjective/Objective:                 Patient admitted from home w DJD and L knee.    Action/Plan:  Spoke with patient and husband at bedside. She has RW and 3/1 at home. She is ordered [facility] PT and CM verified patient will be followed at DC with [facility] through [facility] clinical liaison.   Expected Discharge Date:  03/24/17               Expected Discharge Plan:  Home w Home Health Services  In-House Referral:     Discharge planning Services  CM Consult  Post Acute Care Choice:  Home Health Choice offered to:  Patient  DME Arranged:    DME Agency:     HH Arranged:  PT HH Agency:  [facility] (now [facility])  Status of Service:  Completed, signed off  If discussed at [employer] of Stay Meetings, dates discussed:    Additional Comments:  Lawerance Sabal, RN 03/23/2017, 10:51 AM

## 2017-03-23 NOTE — Discharge Summary (Signed)
Physician Discharge Summary  Patient ID: Caitlin Wagner MRN: [id number] DOB/AGE: Aug 03, 1966 51 y.o.  Admit date: 03/22/2017 Discharge date: 03/24/2017  Admission Diagnoses:  Degenerative joint disease of left knee  Discharge Diagnoses:  Principal Problem:   Degenerative joint disease of left knee   Past Medical History:  Diagnosis Date  . Arthritis   . 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 Rheumatoid arthritis(714.0)     Surgeries: Procedure(s): TOTAL KNEE ARTHROPLASTY w/ Navigation on 03/22/2017   Consultants (if any):   Discharged Condition: Improved  Hospital Course: Caitlin Wagner is an 51 y.o. female who was admitted 03/22/2017 with a diagnosis of Degenerative joint disease of left knee and went to the operating room on 03/22/2017 and underwent the above named procedures.    She was given perioperative antibiotics:  Anti-infectives    Start     Dose/Rate Route Frequency Ordered Stop   03/22/17 2230  ceFAZolin (ANCEF) IVPB 2g/100 mL premix     2 g 200 mL/hr over 30 Minutes Intravenous Every 6 hours 03/22/17 2222 03/23/17 0600   03/22/17 1300  ceFAZolin (ANCEF) IVPB 2g/100 mL premix     2 g 200 mL/hr over 30 Minutes Intravenous To ShortStay [facility] 03/19/17 0756 03/22/17 1410    .  She was given sequential compression devices, early ambulation, and ASA for DVT prophylaxis.  She benefited maximally from the hospital stay and there were no complications.    Recent vital signs:  Vitals:   03/23/17 2105 03/24/17 0415  BP: (!) 107/59 110/63  Pulse: 70 (!) 50  Resp: 18 18  Temp: 98.8 F (37.1 C) 98 F (36.7 C)    Recent laboratory studies:  Lab Results  Component Value Date   HGB 9.8 (L) 03/24/2017   HGB 11.2 (L) 03/23/2017   HGB 13.3 03/12/2017   Lab Results   Component Value Date   WBC 8.5 03/24/2017   PLT 179 03/24/2017   No results found for: INR Lab Results  Component Value Date   NA 136 03/23/2017   K 4.4 03/23/2017   CL 105 03/23/2017   CO2 21 (L) 03/23/2017   BUN 10 03/23/2017   CREATININE 0.54 03/23/2017   GLUCOSE 103 (H) 03/23/2017    Discharge Medications:   Allergies as of 03/24/2017      Reactions   Remicade [infliximab] Anaphylaxis   Codeine    Extreme feeling of something crawling      Medication List    STOP taking these medications   acetaminophen 500 MG tablet Commonly known as:  TYLENOL   leflunomide 20 MG tablet Commonly known as:  ARAVA   XELJANZ 5 MG Tabs Generic drug:  Tofacitinib Citrate     TAKE these medications   aspirin 81 MG chewable tablet Chew 1 tablet (81 mg total) by mouth 2 (two) times daily.   docusate sodium 100 MG capsule Commonly known as:  COLACE Take 1 capsule (100 mg total) by mouth 2 (two) times daily.   HYDROcodone-acetaminophen 5-325 MG tablet Commonly known as:  NORCO/VICODIN Take 1-2 tablets by mouth every 4 (four) hours as needed (breakthrough pain).   ibuprofen 200 MG tablet Commonly known as:  ADVIL,MOTRIN Take 800 mg by mouth every 8 (eight) hours as needed (for pain).   ondansetron 4 MG tablet Commonly known as:  ZOFRAN Take 1 tablet (4 mg total) by mouth every 6 (six) hours as needed for nausea.   oxyCODONE 10 mg 12 hr tablet Commonly known as:  OXYCONTIN Take 1 tablet (10 mg total) by mouth PRO. 1 tab PO every 12 hours for 3 days, then 1 tab PO daily for 4 days   senna 8.6 MG Tabs tablet Commonly known as:  SENOKOT Take 2 tablets (17.2 mg total) by mouth at bedtime.   valACYclovir 500 MG tablet Commonly known as:  VALTREX Take 1 tablet (500 mg total) by mouth daily.            Durable Medical Equipment        Start     Ordered   03/22/17 2223  DME 3 n 1  Once     03/22/17 2222   03/22/17 2223  DME Walker rolling  Once    Question:  Patient  needs a walker to treat with the following condition  Answer:  S/P total knee replacement, left   03/22/17 2222      Diagnostic Studies: Dg Knee Left Port  Result Date: 03/22/2017 CLINICAL DATA:  51 year old female post left arthroplasty. Subsequent encounter. EXAM: PORTABLE LEFT KNEE - 1-2 VIEW COMPARISON:  07/29/2015 plain film exam. FINDINGS: Post total left knee replacement appearing in satisfactory position without complication noted. Gas in joint space. IMPRESSION: Post left knee replacement without complication noted. Electronically Signed   By: Lacy Duverney M.D.   On: 03/22/2017 17:34    Disposition:   Discharge Instructions    Call MD / Call 911    Complete by:  As directed    If you experience chest pain or shortness of breath, CALL 911 and be transported to the hospital emergency room.  If you develope a fever above 101 F, pus (white drainage) or increased drainage or redness at the wound, or calf pain, call your surgeon's office.   Constipation Prevention    Complete by:  As directed    Drink plenty of fluids.  Prune juice may be helpful.  You may use a stool softener, such as Colace (over the counter) 100 mg twice a day.  Use MiraLax (over the counter) for constipation as needed.   Diet - low sodium heart healthy    Complete by:  As directed    Do not put a pillow under the knee. Place it under the heel.    Complete by:  As directed    Driving restrictions    Complete by:  As directed    No driving for 6 weeks   Increase activity slowly as tolerated    Complete by:  As directed    Lifting restrictions    Complete by:  As directed    No lifting for 6 weeks      Follow-up Information    Shadman Tozzi, Arlys John, MD. Schedule an appointment as soon as possible for a visit in 2 weeks.   Specialty:  Orthopedic Surgery Why:  For wound re-check Contact information: [address] [place] [address] [phone]            Signed: Garnet Koyanagi 03/24/2017, 7:39 AM

## 2017-03-23 NOTE — Progress Notes (Signed)
   Subjective:  Patient reports pain as moderate to severe.  Pain control issues o/n. Denies CP/SOB.  Objective:   VITALS:   Vitals:   03/22/17 2020 03/22/17 2028 03/22/17 2137 03/23/17 0429  BP: 113/72 99/66 102/72 (!) 91/54  Pulse: 68 69 74 (!) 54  Resp: 10 15 18 20   Temp:  97.4 F (36.3 C) 97.9 F (36.6 C) 98 F (36.7 C)  TempSrc:   Oral Oral  SpO2: 95% 98% 97% 96%  Weight:      Height:        NAD ABD soft Sensation intact distally Intact pulses distally Dorsiflexion/Plantar flexion intact Incision: dressing C/D/I Compartment soft   Lab Results  Component Value Date   WBC 10.9 (H) 03/23/2017   HGB 11.2 (L) 03/23/2017   HCT 35.3 (L) 03/23/2017   MCV 91.0 03/23/2017   PLT 196 03/23/2017   BMET    Component Value Date/Time   NA 136 03/23/2017 0622   K 4.4 03/23/2017 0622   CL 105 03/23/2017 0622   CO2 21 (L) 03/23/2017 0622   GLUCOSE 103 (H) 03/23/2017 0622   BUN 10 03/23/2017 0622   CREATININE 0.54 03/23/2017 0622   CALCIUM 8.2 (L) 03/23/2017 0622   GFRNONAA >60 03/23/2017 0622   GFRAA >60 03/23/2017 0622     Assessment/Plan: 1 Day Post-Op   Principal Problem:   Degenerative joint disease of left knee   WBAT with walker DVT ppx: ASA, SCDs, TEDs PO pain control: add oxycontin taper, cont hydrocodone PRN, toradol PT/OT Dispo: D/C home tomorrow with HHPT   Caitlin Wagner, 05/23/2017 03/23/2017, 8:07 AM   05/23/2017, MD Cell [phone]

## 2017-03-23 NOTE — Evaluation (Signed)
Occupational Therapy Evaluation/Discharge Patient Details Name: Caitlin Wagner MRN: [id number] DOB: 1966-10-17 Today's Date: 03/23/2017    History of Present Illness Admitted for LTKA, Caitlin Wagner;  has a past medical history of Arthritis; and Rheumatoid arthritis(714.0).   Clinical Impression   PTA, pt was independent with ADL and functional mobility and working as a Manufacturing systems engineer. Pt currently requires supervision for toilet transfers, shower transfers, and LB ADL. Educated pt and husband concerning knee precautions during ADL including no pillow under knee. Additionally educated pt on safe use of 3-in-1 for shower transfer, fall prevention strategies, and energy conservation strategies as well as use of ice for pain management. Pt and husband verbalize and demonstrate understanding of all topics. Pt will have 24 hour assistance initially post-acute D/C and husband is able to provide the necessary assistance. No further acute OT needs identified and OT will sign off.     Follow Up Recommendations  No OT follow up;Supervision/Assistance - 24 hour (initial 24 hour assistance)    Equipment Recommendations  None recommended by OT    Recommendations for Other Services       Precautions / Restrictions Precautions Precautions: Knee Precaution Booklet Issued: No Precaution Comments: Reviewed knee precautions verbally during ADL. PT provided handout previously.  Restrictions Weight Bearing Restrictions: Yes LLE Weight Bearing: Weight bearing as tolerated      Mobility Bed Mobility               General bed mobility comments: OOB in chair on OT arrival  Transfers Overall transfer level: Needs assistance Equipment used: Rolling walker (2 wheeled) Transfers: Sit to/from Stand Sit to Stand: Supervision         General transfer comment: Pt able to complete without placing hands on RW this session.     Balance Overall balance assessment: Needs assistance Sitting-balance  support: No upper extremity supported;Feet supported Sitting balance-[name] Scale: Good     Standing balance support: Bilateral upper extremity supported;No upper extremity supported;During functional activity Standing balance-[name] Scale: Fair Standing balance comment: ABle to statically stand at sink for grooming tasks without UE support.                            ADL either performed or assessed with clinical judgement   ADL Overall ADL's : Needs assistance/impaired Eating/Feeding: Set up;Sitting   Grooming: Supervision/safety;Standing   Upper Body Bathing: Set up;Sitting   Lower Body Bathing: Supervison/ safety;Sit to/from stand   Upper Body Dressing : Set up;Sitting   Lower Body Dressing: Supervision/safety;Sit to/from stand   Toilet Transfer: Supervision/safety;RW;Ambulation;BSC   Toileting- Architect and Hygiene: Supervision/safety;Sit to/from stand   Tub/ Shower Transfer: Supervision/safety;Ambulation;Rolling walker;3 in 1;Tub transfer   Functional mobility during ADLs: Supervision/safety;Rolling walker General ADL Comments: Pt and husband educated concerning strategies for LB dressing tasks to dress operated leg first. Additionally educated pt concerning safe use of 3-in-1 for tub/shower transfers and after education, pt demonstrates good understanding.      Vision Patient Visual Report: No change from baseline Vision Assessment?: No apparent visual deficits     Perception     Praxis      Pertinent Vitals/Pain Pain Assessment: Faces Faces Pain Scale: Hurts a little bit Pain Location: L knee Pain Descriptors / Indicators: Aching Pain Intervention(s): Monitored during session     Hand Dominance     Extremity/Trunk Assessment Upper Extremity Assessment Upper Extremity Assessment: RUE deficits/detail;LUE deficits/detail;Overall [facility] for tasks assessed RUE Deficits / Details:  Arthritic changes limiting wrist, MCP, PIP, and DIP movement  with significant ulnar drift. Able to functionally use hands for ADL tasks with compensatory strategies. LUE Deficits / Details: Arthritic changes limiting wrist, MCP, PIP, and DIP movement with significant ulnar drift. Able to functionally use hands for ADL tasks with compensatory strategies.   Lower Extremity Assessment Lower Extremity Assessment: LLE deficits/detail LLE Deficits / Details: Decreased strength and ROM as expected post-operatively.       Communication Communication Communication: No difficulties   Cognition Arousal/Alertness: Awake/alert Behavior During Therapy: WFL for tasks assessed/performed Overall Cognitive Status: Within Functional Limits for tasks assessed                                     General Comments       Exercises     Shoulder Instructions      Home Living Family/patient expects to be discharged to:: Private residence Living Arrangements: Spouse/significant other Available Help at Discharge: Family;Available 24 hours/day Type of Home: House Home Access: Stairs to enter [employer] of Steps: 2 (1+1) Entrance Stairs-Rails: None Home Layout: Two level;1/2 bath on main level;Bed/bath upstairs Alternate Level Stairs-Number of Steps: Flight Alternate Level Stairs-Rails: Right Bathroom Shower/Tub: Chief Strategy Officer: Standard     Home Equipment: Environmental consultant - 2 wheels;Bedside commode;Grab bars - tub/shower (grab bars are the temporary type)          Prior Functioning/Environment Level of Independence: Independent        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 Problem List: Decreased strength;Decreased activity tolerance;Impaired balance (sitting and/or standing);Decreased safety awareness;Decreased knowledge of use of DME or AE;Decreased knowledge of precautions;Decreased range of motion;Pain      OT Treatment/Interventions:      OT Goals(Current goals can be found in the care plan  section) Acute Rehab OT Goals Patient Stated Goal: walk in the partk without pain OT Goal Formulation: With patient/family Time For Goal Achievement: 04/06/17 Potential to Achieve Goals: Good  OT Frequency:     Barriers to D/C:            Co-evaluation              AM-PAC PT "6 Clicks" Daily Activity     Outcome Measure Help from another person eating meals?: None Help from another person taking care of personal grooming?: A Little Help from another person toileting, which includes using toliet, bedpan, or urinal?: A Little Help from another person bathing (including washing, rinsing, drying)?: A Little Help from another person to put on and taking off regular upper body clothing?: None Help from another person to put on and taking off regular lower body clothing?: A Little 6 Click Score: 20   End of Session Equipment Utilized During Treatment: Rolling walker  Activity Tolerance: Patient tolerated treatment well Patient left: in chair;with call bell/phone within reach;with family/visitor present  OT Visit Diagnosis: Other abnormalities of gait and mobility (R26.89);Pain Pain - Right/Left: Left Pain - part of body: Knee                Time: 3662-9476 OT Time Calculation (min): 26 min Charges:  OT General Charges $OT Visit: 1 Procedure OT Evaluation $OT Eval Moderate Complexity: 1 Procedure OT Treatments $Self Care/Home Management : 8-22 mins G-Codes:     Doristine Section, MS OTR/L  Pager: [phone]   [facility]  A Michalla Ringer 03/23/2017, 2:01 PM

## 2017-03-24 ENCOUNTER — Encounter (HOSPITAL_COMMUNITY): Payer: Self-pay | Admitting: General Practice

## 2017-03-24 LAB — CBC
HCT: 30.8 % — ABNORMAL LOW (ref 36.0–46.0)
Hemoglobin: 9.8 g/dL — ABNORMAL LOW (ref 12.0–15.0)
MCH: 29.3 pg (ref 26.0–34.0)
MCHC: 31.8 g/dL (ref 30.0–36.0)
MCV: 91.9 fL (ref 78.0–100.0)
Platelets: 179 10*3/uL (ref 150–400)
RBC: 3.35 MIL/uL — ABNORMAL LOW (ref 3.87–5.11)
RDW: 14.6 % (ref 11.5–15.5)
WBC: 8.5 10*3/uL (ref 4.0–10.5)

## 2017-03-24 NOTE — Progress Notes (Signed)
Discharge instructions printed and reviewed with patient and significant other, and copy given for them to take home. All questions addressed at this time. New prescriptions reviewed and paper scripts given to patient to fill at discharge. IV removed, and ensured that surgical dressing was clean, dry and intact. Room searched for patient belongings, and confirmed with patient that all valuables were accounted for. Staff assisted patient to dress, then volunteer staff escorted patient to discharge via wheelchair.

## 2017-03-24 NOTE — Progress Notes (Signed)
   Subjective:  Patient reports pain as moderate to severe. Denies N/V/CP/SOB.  Objective:   VITALS:   Vitals:   03/23/17 0429 03/23/17 1500 03/23/17 2105 03/24/17 0415  BP: (!) 91/54 110/63 (!) 107/59 110/63  Pulse: (!) 54 63 70 (!) 50  Resp: 20 18 18 18   Temp: 98 F (36.7 C) 97.6 F (36.4 C) 98.8 F (37.1 C) 98 F (36.7 C)  TempSrc: Oral Oral Oral Oral  SpO2: 96% 98% 97% 99%  Weight:      Height:        NAD ABD soft Sensation intact distally Intact pulses distally Dorsiflexion/Plantar flexion intact Incision: dressing C/D/I Compartment soft   Lab Results  Component Value Date   WBC 8.5 03/24/2017   HGB 9.8 (L) 03/24/2017   HCT 30.8 (L) 03/24/2017   MCV 91.9 03/24/2017   PLT 179 03/24/2017   BMET    Component Value Date/Time   NA 136 03/23/2017 0622   K 4.4 03/23/2017 0622   CL 105 03/23/2017 0622   CO2 21 (L) 03/23/2017 0622   GLUCOSE 103 (H) 03/23/2017 0622   BUN 10 03/23/2017 0622   CREATININE 0.54 03/23/2017 0622   CALCIUM 8.2 (L) 03/23/2017 0622   GFRNONAA >60 03/23/2017 0622   GFRAA >60 03/23/2017 0622     Assessment/Plan: 2 Days Post-Op   Principal Problem:   Degenerative joint disease of left knee   WBAT with walker DVT ppx: ASA, SCDs, TEDs PO pain control: oxycontin taper, cont hydrocodone PRN, toradol PT/OT Dispo: D/C home with HHPT   Melicia Esqueda, 05/23/2017 03/24/2017, 7:38 AM   05/24/2017, MD Cell [phone]

## 2017-03-24 NOTE — Progress Notes (Signed)
PT Cancellation Note  Patient Details Name: Caitlin Wagner MRN: [id number] DOB: 10/29/1965   Cancelled Treatment:    Reason Eval/Treat Not Completed: (P) Other (comment) (Pt bathing will return later.  )   Florestine Avers 03/24/2017, 10:54 AM  Joycelyn Rua, PTA pager [phone]

## 2017-03-24 NOTE — Progress Notes (Signed)
Physical Therapy Treatment Patient Details Name: Caitlin Wagner MRN: [id number] DOB: 10/28/1965 Today's Date: 03/24/2017    History of Present Illness Admitted for LTKA, Caitlin Wagner;  has a past medical history of Arthritis; and Rheumatoid arthritis(714.0).    PT Comments    Pt performed increased gait and functional mobility.  Reviewed HEP and stair training.  Pt to d/c home with HHPT services.  Became nauseous during session and required meds to correct.  RN to d/c patient after tx.     Follow Up Recommendations  Home health PT;Supervision - Intermittent     Equipment Recommendations  None recommended by PT    Recommendations for Other Services       Precautions / Restrictions Precautions Precautions: Knee Precaution Booklet Issued: No Restrictions Weight Bearing Restrictions: Yes LLE Weight Bearing: Weight bearing as tolerated    Mobility  Bed Mobility               General bed mobility comments: OOB in chair on PT arrival  Transfers Overall transfer level: Needs assistance Equipment used: Rolling walker (2 wheeled) Transfers: Sit to/from Stand Sit to Stand: Supervision         General transfer comment: Cues to push from seated surface.    Ambulation/Gait Ambulation/Gait assistance: Supervision Ambulation Distance (Feet): 200 Feet Assistive device: Rolling walker (2 wheeled) Gait Pattern/deviations: Trunk flexed;Step-through pattern   Gait velocity interpretation: Below normal speed for age/gender General Gait Details: Cues for upper trunk control and heel strike on L    Stairs Stairs: Yes   Stair Management: No rails;With walker;Step to pattern;Forwards Number of Stairs: 2 General stair comments: Cues for sequencing and RW placement.    Wheelchair Mobility    Modified Rankin (Stroke Patients Only)       Balance Overall balance assessment: Needs assistance Sitting-balance support: No upper extremity supported;Feet supported Sitting  balance-[name] Scale: Good       Standing balance-[name] Scale: Fair                              Cognition Arousal/Alertness: Awake/alert Behavior During Therapy: WFL for tasks assessed/performed Overall Cognitive Status: Within Functional Limits for tasks assessed                                        Exercises Total Joint Exercises Ankle Circles/Pumps: AROM;Both;Supine;20 reps Quad Sets: AROM;Left;10 reps Towel Squeeze: AROM;Both;10 reps;Supine Short Arc Quad: AROM;Left;10 reps;Supine Heel Slides: AROM;Left;10 reps;Supine Hip ABduction/ADduction: AROM;Left;Supine Straight Leg Raises: AROM;Left;10 reps;Supine Long Arc Quad: AROM;10 reps;Seated Goniometric ROM: 0-100 degrees L knee flexion    General Comments        Pertinent Vitals/Pain Pain Assessment: 0-10 Pain Score: 6  Pain Location: L knee Pain Descriptors / Indicators: Aching Pain Intervention(s): Monitored during session;Repositioned    Home Living Family/patient expects to be discharged to:: Private residence Living Arrangements: Spouse/significant other                  Prior Function            PT Goals (current goals can now be found in the care plan section) Acute Rehab PT Goals Patient Stated Goal: walk in the partk without pain Potential to Achieve Goals: Good Progress towards PT goals: Progressing toward goals    Frequency    7X/week      PT Plan  Current plan remains appropriate    Co-evaluation              AM-PAC PT "6 Clicks" Daily Activity  Outcome Measure  Difficulty turning over in bed (including adjusting bedclothes, sheets and blankets)?: None Difficulty moving from lying on back to sitting on the side of the bed? : None Difficulty sitting down on and standing up from a chair with arms (e.g., wheelchair, bedside commode, etc,.)?: None Help needed moving to and from a bed to chair (including a wheelchair)?: A Little Help needed  walking in hospital room?: A Little Help needed climbing 3-5 steps with a railing? : A Little 6 Click Score: 21    End of Session Equipment Utilized During Treatment: Gait belt Activity Tolerance: Patient tolerated treatment well Patient left: in chair;with call bell/phone within reach;with family/visitor present Nurse Communication: Mobility status PT Visit Diagnosis: Pain Pain - Right/Left: Left Pain - part of body: Knee     Time: 1140-1205 PT Time Calculation (min) (ACUTE ONLY): 25 min  Charges:  $Gait Training: 8-22 mins $Therapeutic Exercise: 8-22 mins                    G Codes:       Caitlin Wagner, PTA pager [phone]    Caitlin Wagner 03/24/2017, 12:17 PM

## 2017-03-24 NOTE — Plan of Care (Signed)
Problem: Pain Managment: Goal: General experience of comfort will improve Patient's pain is being controlled using oral pain medications.

## 2017-04-06 ENCOUNTER — Ambulatory Visit: Payer: Self-pay | Admitting: Orthopedic Surgery

## 2017-04-28 ENCOUNTER — Ambulatory Visit: Payer: Self-pay | Admitting: Orthopedic Surgery

## 2017-04-28 NOTE — H&P (Signed)
TOTAL KNEE ADMISSION H&P  Patient is being admitted for right total knee arthroplasty.  Subjective:  Chief Complaint:left knee pain.  HPI: Caitlin Wagner, 51 y.o. female, has a history of pain and functional disability in the right knee due to arthritis and has failed non-surgical conservative treatments for greater than 12 weeks to include NSAID's and/or analgesics, corticosteriod injections, flexibility and strengthening excercises, use of assistive devices, weight reduction as appropriate and activity modification.  Onset of symptoms was gradual, starting >10 years ago with gradually worsening course since that time. The patient noted no past surgery on the left knee(s).  Patient currently rates pain in the right knee(s) at 10 out of 10 with activity. Patient has night pain, worsening of pain with activity and weight bearing, pain that interferes with activities of daily living, pain with passive range of motion, crepitus and joint swelling.  Patient has evidence of subchondral cysts, subchondral sclerosis, periarticular osteophytes, joint subluxation and joint space narrowing by imaging studies. There is no active infection.      Patient Active Problem List   Diagnosis Date Noted  . Rheumatoid arthritis(714.0)        Past Medical History:  Diagnosis Date  . 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 Rheumatoid arthritis(714.0)          Past Surgical History:  Procedure Laterality Date  . feet reconstruction surgery     bilateral   . TONSILLECTOMY  age 44     (Not in a hospital admission)      Allergies  Allergen Reactions  . Remicade [Infliximab] Anaphylaxis  . Codeine     Extreme feeling of something crawling         Social History  Substance Use Topics  . Smoking  status: Former Smoker    Packs/day: 0.30  . Smokeless tobacco: Never Used  . Alcohol use No         Family History  Problem Relation Age of Onset  . Heart disease Father        heart attack at age 47  . Diverticulitis Father   . Diabetes Paternal Grandmother   . Heart disease Paternal Grandfather   . Heart failure Maternal Grandmother   . Cancer Maternal Grandfather        ? type     Review of Systems  Constitutional: Negative.   HENT: Negative.   Eyes: Negative.   Respiratory: Negative.   Cardiovascular: Negative.   Gastrointestinal: Negative.   Musculoskeletal: Positive for joint pain.  Skin: Negative.   Neurological: Negative.   Endo/Heme/Allergies: Positive for environmental allergies.  Psychiatric/Behavioral: Negative.     Objective:  Physical Exam  Vitals reviewed. Constitutional: She is oriented to person, place, and time. She appears well-developed.  HENT:  Head: Normocephalic and atraumatic.  Eyes: Conjunctivae and EOM are normal. Pupils are equal, round, and reactive to light.  Neck: Normal range of motion. Neck supple.  Cardiovascular: Normal rate, regular rhythm and intact distal pulses.   Respiratory: She is in respiratory distress.  GI: Soft. She exhibits no distension.  Genitourinary:  Genitourinary Comments: deferred  Musculoskeletal:       Right knee: She exhibits swelling, effusion and ecchymosis. Tenderness found. Medial joint line and lateral joint line tenderness noted.  Neurological: She is alert and oriented to person, place,  and time. She has normal reflexes.  Skin: Skin is warm and dry.  Psychiatric: She has a normal mood and affect. Her behavior is normal. Judgment and thought content normal.    Vital signs in last 24 hours: @VSRANGES@  Labs:   Estimated body mass index is 28.34 kg/m as calculated from the following:   Height as of 11/16/16: 5' 1" (1.549 m).   Weight as of 11/16/16: 68 kg (150 lb).   Imaging  Review Plain radiographs demonstrate severe degenerative joint disease of the right knee(s). The overall alignment is mild valgus. The bone quality appears to be adequate for age and reported activity level.  Assessment/Plan:  End stage arthritis, right knee   The patient history, physical examination, clinical judgment of the provider and imaging studies are consistent with end stage degenerative joint disease of the right knee(s) and total knee arthroplasty is deemed medically necessary. The treatment options including medical management, injection therapy arthroscopy and arthroplasty were discussed at length. The risks and benefits of total knee arthroplasty were presented and reviewed. The risks due to aseptic loosening, infection, stiffness, patella tracking problems, thromboembolic complications and other imponderables were discussed. The patient acknowledged the explanation, agreed to proceed with the plan and consent was signed. Patient is being admitted for inpatient treatment for surgery, pain control, PT, OT, prophylactic antibiotics, VTE prophylaxis, progressive ambulation and ADL's and discharge planning. The patient is planning to be discharged home with home health services  

## 2017-04-29 ENCOUNTER — Encounter (HOSPITAL_COMMUNITY)
Admission: RE | Admit: 2017-04-29 | Discharge: 2017-04-29 | Disposition: A | Discharge status: Home / Self Care | Payer: Medicare Other | Source: Ambulatory Visit | Attending: Orthopedic Surgery | Admitting: Orthopedic Surgery

## 2017-04-29 ENCOUNTER — Encounter (HOSPITAL_COMMUNITY): Payer: Self-pay

## 2017-04-29 DIAGNOSIS — M1711 Unilateral primary osteoarthritis, right knee: Secondary | ICD-10-CM | POA: Insufficient documentation

## 2017-04-29 DIAGNOSIS — Z01818 Encounter for other preprocedural examination: Secondary | ICD-10-CM | POA: Insufficient documentation

## 2017-04-29 LAB — CBC
HCT: 40.1 % (ref 36.0–46.0)
Hemoglobin: 12.3 g/dL (ref 12.0–15.0)
MCH: 28.5 pg (ref 26.0–34.0)
MCHC: 30.7 g/dL (ref 30.0–36.0)
MCV: 92.8 fL (ref 78.0–100.0)
Platelets: 276 10*3/uL (ref 150–400)
RBC: 4.32 MIL/uL (ref 3.87–5.11)
RDW: 14.8 % (ref 11.5–15.5)
WBC: 6 10*3/uL (ref 4.0–10.5)

## 2017-04-29 LAB — COMPREHENSIVE METABOLIC PANEL
ALT: 16 U/L (ref 14–54)
AST: 20 U/L (ref 15–41)
Albumin: 3.5 g/dL (ref 3.5–5.0)
Alkaline Phosphatase: 61 U/L (ref 38–126)
Anion gap: 8 (ref 5–15)
BUN: 7 mg/dL (ref 6–20)
CO2: 24 mmol/L (ref 22–32)
Calcium: 8.9 mg/dL (ref 8.9–10.3)
Chloride: 106 mmol/L (ref 101–111)
Creatinine, Ser: 0.52 mg/dL (ref 0.44–1.00)
GFR calc Af Amer: >60 mL/min (ref >60)
GFR calc non Af Amer: >60 mL/min (ref >60)
Glucose, Bld: 76 mg/dL (ref 65–99)
Potassium: 4.1 mmol/L (ref 3.5–5.1)
Sodium: 138 mmol/L (ref 135–145)
Total Bilirubin: 0.5 mg/dL (ref 0.3–1.2)
Total Protein: 7 g/dL (ref 6.5–8.1)

## 2017-04-29 LAB — HCG, SERUM, QUALITATIVE: Preg, Serum: NEGATIVE

## 2017-04-29 LAB — TYPE AND SCREEN
ABO/RH(D): A NEG
Antibody Screen: NEGATIVE

## 2017-04-29 LAB — SURGICAL PCR SCREEN
MRSA, PCR: NEGATIVE
Staphylococcus aureus: NEGATIVE

## 2017-04-29 NOTE — Pre-Procedure Instructions (Signed)
Caitlin Wagner  04/29/2017      [facility] - [place], [place] - [address] [facility] [place] [address] Phone: [phone] Fax: [phone]  [facility] [address] - [place], [place] - [address] [facility] [address] [place] [address] Phone: [phone] Fax: [phone]     Your procedure is scheduled on May 10, 2017.  Report to [facility] Admitting at 0530 AM.  Call this number if you have problems the morning of surgery:  [phone]   Remember:  Do not eat food or drink liquids after midnight.  Take these medicines the morning of surgery with A SIP OF WATER acetaminophen (tylenol), leflunomide (Arava), tofacitinib citrate Harriette Ohara), valacyclovir (valtrex)-if needed.  7 days prior to surgery STOP taking any Aspirin, Aleve, Naproxen, Ibuprofen, Motrin, Advil, Goody's, BC's, all herbal medications, fish oil, and all vitamins   Do not wear jewelry, make-up or nail polish.  Do not wear lotions, powders, or perfumes, or deoderant.  Do not shave 48 hours prior to surgery.    Do not bring valuables to the hospital.  [facility] is not responsible for any belongings or valuables.  Contacts, dentures or bridgework may not be worn into surgery.  Leave your suitcase in the car.  After surgery it may be brought to your room.  For patients admitted to the hospital, discharge time will be determined by your treatment team.  Patients discharged the day of surgery will not be allowed to drive home.   Special instructions:   [facility]- Preparing For Surgery  Before surgery, you can play an important role. Because skin is not sterile, your skin needs to be as free of germs as possible. You can reduce the number of germs on your skin by washing with CHG (chlorahexidine gluconate) Soap before surgery.  CHG is an antiseptic cleaner which kills germs and bonds with the skin  to continue killing germs even after washing.  Please do not use if you have an allergy to CHG or antibacterial soaps. If your skin becomes reddened/irritated stop using the CHG.  Do not shave (including legs and underarms) for at least 48 hours prior to first CHG shower. It is OK to shave your face.  Please follow these instructions carefully.   1. Shower the NIGHT BEFORE SURGERY and the MORNING OF SURGERY with CHG.   2. If you chose to wash your hair, wash your hair first as usual with your normal shampoo.  3. After you shampoo, rinse your hair and body thoroughly to remove the shampoo.  4. Use CHG as you would any other liquid soap. You can apply CHG directly to the skin and wash gently with a scrungie or a clean washcloth.   5. Apply the CHG Soap to your body ONLY FROM THE NECK DOWN.  Do not use on open wounds or open sores. Avoid contact with your eyes, ears, mouth and genitals (private parts). Wash genitals (private parts) with your normal soap.  6. Wash thoroughly, paying special attention to the area where your surgery will be performed.  7. Thoroughly rinse your body with warm water from the neck down.  8. DO NOT shower/wash with your normal soap after using and rinsing off the CHG Soap.  9. Pat yourself dry with a CLEAN TOWEL.   10. Wear CLEAN PAJAMAS   11. Place CLEAN SHEETS on your bed the night of  your first shower and DO NOT SLEEP WITH PETS.    Day of Surgery: Do not apply any deodorants/lotions. Please wear clean clothes to the hospital/surgery center.     Please read over the following fact sheets that you were given. Pain Booklet, Coughing and Deep Breathing, Total Joint Packet, MRSA Information and Surgical Site Infection Prevention

## 2017-04-29 NOTE — Progress Notes (Signed)
PCP: Dr. Benedetto Goad  Cardiologist: pt denies  EKG: 11/2016 Care everywhere-results reviewed when cleared for surgery 03/2017   Stress test: pt denies ever  ECHO: pt denies ever  Cardiac Cath: pt denies ever  Chest x-ray: 11/2016 Care everywhere-results reviewed when cleared for surgery 03/2017  See Marylene Land Kabbe's note 03/12/17

## 2017-05-07 MED ORDER — TRANEXAMIC ACID 1000 MG/10ML IV SOLN
1000.0000 mg | INTRAVENOUS | Status: AC
  Administered 2017-05-10: 1000 mg via INTRAVENOUS
  Filled 2017-05-07: qty 1100

## 2017-05-07 MED ORDER — SODIUM CHLORIDE 0.9 % IV SOLN: INTRAVENOUS | Status: DC

## 2017-05-07 MED ORDER — ACETAMINOPHEN 10 MG/ML IV SOLN
1000.0000 mg | INTRAVENOUS | Status: DC
  Filled 2017-05-07: qty 100

## 2017-05-07 MED ORDER — CEFAZOLIN SODIUM-DEXTROSE 2-4 GM/100ML-% IV SOLN
2.0000 g | INTRAVENOUS | Status: AC
  Administered 2017-05-10: 2 g via INTRAVENOUS
  Filled 2017-05-07: qty 100

## 2017-05-09 DIAGNOSIS — Z96651 Presence of right artificial knee joint: Secondary | ICD-10-CM | POA: Insufficient documentation

## 2017-05-10 ENCOUNTER — Encounter (HOSPITAL_COMMUNITY): Payer: Self-pay | Admitting: Certified Registered"

## 2017-05-10 ENCOUNTER — Inpatient Hospital Stay (HOSPITAL_COMMUNITY): Payer: Medicare Other | Admitting: Certified Registered"

## 2017-05-10 ENCOUNTER — Observation Stay (HOSPITAL_COMMUNITY)
Admission: RE | Admit: 2017-05-10 | Discharge: 2017-05-11 | Disposition: A | Discharge status: Home / Self Care | Payer: Medicare Other | Source: Ambulatory Visit | Attending: Orthopedic Surgery | Admitting: Orthopedic Surgery

## 2017-05-10 ENCOUNTER — Observation Stay (HOSPITAL_COMMUNITY): Payer: Medicare Other

## 2017-05-10 ENCOUNTER — Encounter (HOSPITAL_COMMUNITY)
Admission: RE | Disposition: A | Discharge status: Home / Self Care | Payer: Self-pay | Source: Ambulatory Visit | Attending: Orthopedic Surgery

## 2017-05-10 DIAGNOSIS — M1711 Unilateral primary osteoarthritis, right knee: Principal | ICD-10-CM | POA: Diagnosis present

## 2017-05-10 DIAGNOSIS — M069 Rheumatoid arthritis, unspecified: Secondary | ICD-10-CM | POA: Insufficient documentation

## 2017-05-10 DIAGNOSIS — Z8249 Family history of ischemic heart disease and other diseases of the circulatory system: Secondary | ICD-10-CM | POA: Insufficient documentation

## 2017-05-10 DIAGNOSIS — Z87892 Personal history of anaphylaxis: Secondary | ICD-10-CM | POA: Diagnosis not present

## 2017-05-10 DIAGNOSIS — Z79899 Other long term (current) drug therapy: Secondary | ICD-10-CM | POA: Diagnosis not present

## 2017-05-10 DIAGNOSIS — Z833 Family history of diabetes mellitus: Secondary | ICD-10-CM | POA: Insufficient documentation

## 2017-05-10 DIAGNOSIS — A609 Anogenital herpesviral infection, unspecified: Secondary | ICD-10-CM | POA: Diagnosis not present

## 2017-05-10 DIAGNOSIS — Z888 Allergy status to other drugs, medicaments and biological substances status: Secondary | ICD-10-CM | POA: Diagnosis not present

## 2017-05-10 DIAGNOSIS — Z87891 Personal history of nicotine dependence: Secondary | ICD-10-CM | POA: Insufficient documentation

## 2017-05-10 DIAGNOSIS — Z885 Allergy status to narcotic agent status: Secondary | ICD-10-CM | POA: Diagnosis not present

## 2017-05-10 DIAGNOSIS — Z8379 Family history of other diseases of the digestive system: Secondary | ICD-10-CM | POA: Insufficient documentation

## 2017-05-10 DIAGNOSIS — Z9889 Other specified postprocedural states: Secondary | ICD-10-CM | POA: Insufficient documentation

## 2017-05-10 DIAGNOSIS — Z09 Encounter for follow-up examination after completed treatment for conditions other than malignant neoplasm: Secondary | ICD-10-CM

## 2017-05-10 HISTORY — PX: TOTAL KNEE ARTHROPLASTY: SHX125

## 2017-05-10 SURGERY — ARTHROPLASTY, KNEE, TOTAL
Anesthesia: Monitor Anesthesia Care | Site: Knee | Laterality: Right

## 2017-05-10 MED ORDER — DIPHENHYDRAMINE HCL 12.5 MG/5ML PO ELIX: 12.5000 mg | ORAL_SOLUTION | ORAL | Status: DC | PRN

## 2017-05-10 MED ORDER — ONDANSETRON HCL 4 MG PO TABS: 4.0000 mg | ORAL_TABLET | Freq: Four times a day (QID) | ORAL | Status: DC | PRN

## 2017-05-10 MED ORDER — OXYCODONE HCL ER 10 MG PO T12A
10.0000 mg | EXTENDED_RELEASE_TABLET | Freq: Two times a day (BID) | ORAL | Status: DC
  Administered 2017-05-10 – 2017-05-11 (×3): 10 mg via ORAL
  Filled 2017-05-10 (×3): qty 1

## 2017-05-10 MED ORDER — HYDROMORPHONE HCL 1 MG/ML IJ SOLN: 0.2500 mg | INTRAMUSCULAR | Status: DC | PRN

## 2017-05-10 MED ORDER — POLYETHYLENE GLYCOL 3350 17 G PO PACK: 17.0000 g | PACK | Freq: Every day | ORAL | Status: DC | PRN

## 2017-05-10 MED ORDER — MIDAZOLAM HCL 2 MG/2ML IJ SOLN
2.0000 mg | Freq: Once | INTRAMUSCULAR | Status: AC
  Administered 2017-05-10: 2 mg via INTRAVENOUS

## 2017-05-10 MED ORDER — POVIDONE-IODINE 10 % EX SWAB: 2.0000 "application " | Freq: Once | CUTANEOUS | Status: DC

## 2017-05-10 MED ORDER — KETOROLAC TROMETHAMINE 15 MG/ML IJ SOLN
15.0000 mg | Freq: Four times a day (QID) | INTRAMUSCULAR | Status: AC
  Administered 2017-05-10 – 2017-05-11 (×3): 15 mg via INTRAVENOUS
  Filled 2017-05-10 (×3): qty 1

## 2017-05-10 MED ORDER — LACTATED RINGERS IV SOLN
INTRAVENOUS | Status: DC
  Administered 2017-05-10 (×2): via INTRAVENOUS

## 2017-05-10 MED ORDER — MENTHOL 3 MG MT LOZG: 1.0000 | LOZENGE | OROMUCOSAL | Status: DC | PRN

## 2017-05-10 MED ORDER — CHLORHEXIDINE GLUCONATE 4 % EX LIQD: 60.0000 mL | Freq: Once | CUTANEOUS | Status: DC

## 2017-05-10 MED ORDER — SENNA 8.6 MG PO TABS
2.0000 | ORAL_TABLET | Freq: Every day | ORAL | Status: DC
  Administered 2017-05-10: 17.2 mg via ORAL
  Filled 2017-05-10: qty 2

## 2017-05-10 MED ORDER — BUPIVACAINE IN DEXTROSE 0.75-8.25 % IT SOLN
INTRATHECAL | Status: DC | PRN
  Administered 2017-05-10: 2 mL via INTRATHECAL

## 2017-05-10 MED ORDER — SODIUM CHLORIDE 0.9 % IJ SOLN
INTRAMUSCULAR | Status: DC | PRN
  Administered 2017-05-10: 30 mL via INTRAVENOUS

## 2017-05-10 MED ORDER — METOCLOPRAMIDE HCL 5 MG/ML IJ SOLN
5.0000 mg | Freq: Three times a day (TID) | INTRAMUSCULAR | Status: DC | PRN
  Administered 2017-05-10: 10 mg via INTRAVENOUS
  Filled 2017-05-10: qty 2

## 2017-05-10 MED ORDER — CEFAZOLIN SODIUM-DEXTROSE 2-4 GM/100ML-% IV SOLN
2.0000 g | Freq: Four times a day (QID) | INTRAVENOUS | Status: AC
  Administered 2017-05-10 (×2): 2 g via INTRAVENOUS
  Filled 2017-05-10 (×2): qty 100

## 2017-05-10 MED ORDER — METOCLOPRAMIDE HCL 5 MG PO TABS: 5.0000 mg | ORAL_TABLET | Freq: Three times a day (TID) | ORAL | Status: DC | PRN

## 2017-05-10 MED ORDER — FENTANYL CITRATE (PF) 250 MCG/5ML IJ SOLN
INTRAMUSCULAR | Status: AC
  Filled 2017-05-10: qty 5

## 2017-05-10 MED ORDER — ACETAMINOPHEN 325 MG PO TABS: 650.0000 mg | ORAL_TABLET | Freq: Four times a day (QID) | ORAL | Status: DC | PRN

## 2017-05-10 MED ORDER — BUPIVACAINE-EPINEPHRINE (PF) 0.5% -1:200000 IJ SOLN
INTRAMUSCULAR | Status: AC
  Filled 2017-05-10: qty 30

## 2017-05-10 MED ORDER — PHENOL 1.4 % MT LIQD: 1.0000 | OROMUCOSAL | Status: DC | PRN

## 2017-05-10 MED ORDER — TRANEXAMIC ACID 1000 MG/10ML IV SOLN
1000.0000 mg | Freq: Once | INTRAVENOUS | Status: AC
  Administered 2017-05-10: 1000 mg via INTRAVENOUS
  Filled 2017-05-10: qty 10

## 2017-05-10 MED ORDER — SODIUM CHLORIDE 0.9 % IR SOLN
Status: DC | PRN
  Administered 2017-05-10: 3000 mL

## 2017-05-10 MED ORDER — ASPIRIN 81 MG PO CHEW
81.0000 mg | CHEWABLE_TABLET | Freq: Two times a day (BID) | ORAL | Status: DC
  Administered 2017-05-10 – 2017-05-11 (×2): 81 mg via ORAL
  Filled 2017-05-10 (×3): qty 1

## 2017-05-10 MED ORDER — PHENYLEPHRINE HCL 10 MG/ML IJ SOLN
INTRAVENOUS | Status: DC | PRN
  Administered 2017-05-10: 20 ug/min via INTRAVENOUS

## 2017-05-10 MED ORDER — SIMETHICONE 80 MG PO CHEW: 80.0000 mg | CHEWABLE_TABLET | ORAL | Status: DC | PRN

## 2017-05-10 MED ORDER — FENTANYL CITRATE (PF) 100 MCG/2ML IJ SOLN
50.0000 ug | Freq: Once | INTRAMUSCULAR | Status: AC
  Administered 2017-05-10: 50 ug via INTRAVENOUS

## 2017-05-10 MED ORDER — FENTANYL CITRATE (PF) 250 MCG/5ML IJ SOLN
INTRAMUSCULAR | Status: DC | PRN
  Administered 2017-05-10: 50 ug via INTRAVENOUS

## 2017-05-10 MED ORDER — PROPOFOL 500 MG/50ML IV EMUL
INTRAVENOUS | Status: DC | PRN
  Administered 2017-05-10: 75 ug/kg/min via INTRAVENOUS

## 2017-05-10 MED ORDER — OXYCODONE HCL 5 MG/5ML PO SOLN: 5.0000 mg | Freq: Once | ORAL | Status: DC | PRN

## 2017-05-10 MED ORDER — ACETAMINOPHEN 650 MG RE SUPP: 650.0000 mg | Freq: Four times a day (QID) | RECTAL | Status: DC | PRN

## 2017-05-10 MED ORDER — METHOCARBAMOL 1000 MG/10ML IJ SOLN
500.0000 mg | Freq: Four times a day (QID) | INTRAMUSCULAR | Status: DC | PRN
  Filled 2017-05-10: qty 5

## 2017-05-10 MED ORDER — KETOROLAC TROMETHAMINE 30 MG/ML IJ SOLN
INTRAMUSCULAR | Status: AC
  Filled 2017-05-10: qty 1

## 2017-05-10 MED ORDER — ONDANSETRON HCL 4 MG/2ML IJ SOLN
4.0000 mg | Freq: Four times a day (QID) | INTRAMUSCULAR | Status: DC | PRN
  Administered 2017-05-11: 4 mg via INTRAVENOUS
  Filled 2017-05-10: qty 2

## 2017-05-10 MED ORDER — DOCUSATE SODIUM 100 MG PO CAPS
100.0000 mg | ORAL_CAPSULE | Freq: Two times a day (BID) | ORAL | Status: DC
  Administered 2017-05-10 – 2017-05-11 (×2): 100 mg via ORAL
  Filled 2017-05-10 (×3): qty 1

## 2017-05-10 MED ORDER — VALACYCLOVIR HCL 500 MG PO TABS
500.0000 mg | ORAL_TABLET | Freq: Every day | ORAL | Status: DC
Start: 2017-05-10 — End: 2017-05-11
  Administered 2017-05-10 – 2017-05-11 (×2): 500 mg via ORAL
  Filled 2017-05-10 (×2): qty 1

## 2017-05-10 MED ORDER — KETOROLAC TROMETHAMINE 30 MG/ML IJ SOLN
INTRAMUSCULAR | Status: DC | PRN
  Administered 2017-05-10: 30 mg via INTRAVENOUS

## 2017-05-10 MED ORDER — HYDROCODONE-ACETAMINOPHEN 5-325 MG PO TABS
1.0000 | ORAL_TABLET | ORAL | Status: DC | PRN
  Administered 2017-05-10: 2 via ORAL
  Administered 2017-05-11 (×3): 1 via ORAL
  Administered 2017-05-11 (×2): 2 via ORAL
  Filled 2017-05-10 (×2): qty 1
  Filled 2017-05-10 (×2): qty 2
  Filled 2017-05-10: qty 1
  Filled 2017-05-10: qty 2

## 2017-05-10 MED ORDER — HYDROMORPHONE HCL 1 MG/ML IJ SOLN
0.5000 mg | INTRAMUSCULAR | Status: DC | PRN
  Administered 2017-05-10: 0.5 mg via INTRAVENOUS
  Administered 2017-05-10: 1 mg via INTRAVENOUS
  Filled 2017-05-10 (×2): qty 1

## 2017-05-10 MED ORDER — MIDAZOLAM HCL 2 MG/2ML IJ SOLN
INTRAMUSCULAR | Status: AC
  Administered 2017-05-10: 2 mg via INTRAVENOUS
  Filled 2017-05-10: qty 2

## 2017-05-10 MED ORDER — METHOCARBAMOL 500 MG PO TABS
500.0000 mg | ORAL_TABLET | Freq: Four times a day (QID) | ORAL | Status: DC | PRN
  Administered 2017-05-10 – 2017-05-11 (×3): 500 mg via ORAL
  Filled 2017-05-10 (×3): qty 1

## 2017-05-10 MED ORDER — BUPIVACAINE-EPINEPHRINE (PF) 0.5% -1:200000 IJ SOLN
INTRAMUSCULAR | Status: DC | PRN
  Administered 2017-05-10: 30 mL via PERINEURAL

## 2017-05-10 MED ORDER — BUPIVACAINE-EPINEPHRINE (PF) 0.5% -1:200000 IJ SOLN
INTRAMUSCULAR | Status: DC | PRN
  Administered 2017-05-10: 15 mL via PERINEURAL

## 2017-05-10 MED ORDER — SODIUM CHLORIDE 0.9 % IV SOLN
INTRAVENOUS | Status: DC
  Administered 2017-05-10 – 2017-05-11 (×2): via INTRAVENOUS

## 2017-05-10 MED ORDER — ALUM & MAG HYDROXIDE-SIMETH 200-200-20 MG/5ML PO SUSP: 30.0000 mL | ORAL | Status: DC | PRN

## 2017-05-10 MED ORDER — FENTANYL CITRATE (PF) 100 MCG/2ML IJ SOLN
INTRAMUSCULAR | Status: AC
  Administered 2017-05-10: 50 ug via INTRAVENOUS
  Filled 2017-05-10: qty 2

## 2017-05-10 MED ORDER — 0.9 % SODIUM CHLORIDE (POUR BTL) OPTIME
TOPICAL | Status: DC | PRN
  Administered 2017-05-10: 1000 mL

## 2017-05-10 MED ORDER — OXYCODONE HCL 5 MG PO TABS: 5.0000 mg | ORAL_TABLET | Freq: Once | ORAL | Status: DC | PRN

## 2017-05-10 MED ORDER — DEXAMETHASONE SODIUM PHOSPHATE 10 MG/ML IJ SOLN
10.0000 mg | Freq: Once | INTRAMUSCULAR | Status: AC
  Administered 2017-05-11: 10 mg via INTRAVENOUS
  Filled 2017-05-10: qty 1

## 2017-05-10 SURGICAL SUPPLY — 48 items
ADH SKN CLS APL DERMABOND .7 (GAUZE/BANDAGES/DRESSINGS) ×2
ALCOHOL ISOPROPYL (RUBBING) (MISCELLANEOUS) ×2 IMPLANT
BANDAGE ACE 6X5 VEL STRL LF (GAUZE/BANDAGES/DRESSINGS) ×2 IMPLANT
BATTERY INSTRU NAVIGATION (MISCELLANEOUS) ×6 IMPLANT
BLADE SAW RECIP 87.9 MT (BLADE) ×2 IMPLANT
BTRY SRG DRVR LF (MISCELLANEOUS) ×3
CAPT KNEE TRIATH TK-4 ×2 IMPLANT
CHLORAPREP W/TINT 26ML (MISCELLANEOUS) ×4 IMPLANT
CUFF TOURNIQUET SINGLE 34IN LL (TOURNIQUET CUFF) ×2 IMPLANT
DERMABOND ADVANCED (GAUZE/BANDAGES/DRESSINGS) ×2
DERMABOND ADVANCED .7 DNX12 (GAUZE/BANDAGES/DRESSINGS) ×2 IMPLANT
DRAIN HEMOVAC 7FR (DRAIN) IMPLANT
DRAPE EXTREMITY T 121X128X90 (DRAPE) ×2 IMPLANT
DRAPE SHEET LG 3/4 BI-LAMINATE (DRAPES) ×4 IMPLANT
DRAPE U-SHAPE 47X51 STRL (DRAPES) ×2 IMPLANT
DRAPE UNIVERSAL PACK (DRAPES) ×2 IMPLANT
DRSG AQUACEL AG ADV 3.5X10 (GAUZE/BANDAGES/DRESSINGS) ×2 IMPLANT
DRSG AQUACEL AG ADV 3.5X14 (GAUZE/BANDAGES/DRESSINGS) IMPLANT
DRSG TEGADERM 2-3/8X2-3/4 SM (GAUZE/BANDAGES/DRESSINGS) ×2 IMPLANT
ELECT REM PT RETURN 9FT ADLT (ELECTROSURGICAL) ×2
ELECTRODE REM PT RTRN 9FT ADLT (ELECTROSURGICAL) ×1 IMPLANT
EVACUATOR 1/8 PVC DRAIN (DRAIN) IMPLANT
GLOVE BIO SURGEON STRL SZ8.5 (GLOVE) ×6 IMPLANT
GLOVE BIOGEL PI IND STRL 8.5 (GLOVE) ×1 IMPLANT
GLOVE BIOGEL PI INDICATOR 8.5 (GLOVE) ×1
GOWN STRL REUS W/TWL 2XL LVL3 (GOWN DISPOSABLE) ×4 IMPLANT
HANDPIECE INTERPULSE COAX TIP (DISPOSABLE) ×2
HOOD PEEL AWAY FLYTE STAYCOOL (MISCELLANEOUS) ×4 IMPLANT
KIT BASIN OR (CUSTOM PROCEDURE TRAY) ×2 IMPLANT
MANIFOLD NEPTUNE II (INSTRUMENTS) ×2 IMPLANT
NEEDLE SPNL 18GX3.5 QUINCKE PK (NEEDLE) ×2 IMPLANT
PACK TOTAL JOINT (CUSTOM PROCEDURE TRAY) ×2 IMPLANT
PACK TOTAL KNEE CUSTOM (KITS) ×2 IMPLANT
PADDING CAST COTTON 6X4 STRL (CAST SUPPLIES) IMPLANT
SAW OSC TIP CART 19.5X105X1.3 (SAW) ×2 IMPLANT
SEALER BIPOLAR AQUA 6.0 (INSTRUMENTS) ×2 IMPLANT
SET HNDPC FAN SPRY TIP SCT (DISPOSABLE) ×1 IMPLANT
SET PAD KNEE POSITIONER (MISCELLANEOUS) ×2 IMPLANT
SUT MNCRL AB 3-0 PS2 27 (SUTURE) ×2 IMPLANT
SUT MON AB 2-0 CT1 36 (SUTURE) ×4 IMPLANT
SUT VIC AB 1 CTX 27 (SUTURE) ×4 IMPLANT
SUT VIC AB 2-0 CT1 27 (SUTURE) ×2
SUT VIC AB 2-0 CT1 TAPERPNT 27 (SUTURE) ×1 IMPLANT
SUT VLOC 180 0 24IN GS25 (SUTURE) ×4 IMPLANT
SYR 50ML LL SCALE MARK (SYRINGE) ×4 IMPLANT
TOWER CARTRIDGE SMART MIX (DISPOSABLE) IMPLANT
TRAY CATH 16FR W/PLASTIC CATH (SET/KITS/TRAYS/PACK) ×2 IMPLANT
WRAP KNEE MAXI GEL POST OP (GAUZE/BANDAGES/DRESSINGS) ×2 IMPLANT

## 2017-05-10 NOTE — Anesthesia Procedure Notes (Signed)
Spinal  Patient location during procedure: OR Start time: 05/10/2017 10:31 AM End time: 05/10/2017 10:35 AM Staffing Anesthesiologist: Val Eagle Preanesthetic Checklist Completed: patient identified, surgical consent, pre-op evaluation, timeout performed, IV checked, risks and benefits discussed and monitors and equipment checked Spinal Block Patient position: sitting Prep: site prepped and draped and DuraPrep Patient monitoring: heart rate, cardiac monitor, continuous pulse ox and blood pressure Approach: midline Location: L4-5 Injection technique: single-shot Needle Needle type: Pencan  Needle gauge: 24 G Needle length: 10 cm Assessment Sensory level: T6

## 2017-05-10 NOTE — Anesthesia Procedure Notes (Signed)
Anesthesia Regional Block: Adductor canal block   Pre-Anesthetic Checklist: ,, timeout performed, Correct Patient, Correct Site, Correct Laterality, Correct Procedure, Correct Position, site marked, Risks and benefits discussed,  Surgical consent,  Pre-op evaluation,  At surgeon's request and post-op pain management  Laterality: Lower and Right  Prep: chloraprep       Needles:  Injection technique: Single-shot  Needle Type: Echogenic Stimulator Needle          Additional Needles:   Procedures: ultrasound guided,,,,,,,,  Narrative:  Start time: 05/10/2017 10:10 AM End time: 05/10/2017 10:14 AM Injection made incrementally with aspirations every 5 mL.  Performed by: Personally  Anesthesiologist: Damarco Keysor  Additional Notes: H+P and labs reviewed, risks and benefits discussed with patient, procedure tolerated well without complications

## 2017-05-10 NOTE — Interval H&P Note (Signed)
History and Physical Interval Note:  05/10/2017 9:27 AM  Caitlin Wagner  has presented today for surgery, with the diagnosis of right knee DJD  The various methods of treatment have been discussed with the patient and family. After consideration of risks, benefits and other options for treatment, the patient has consented to  Procedure(s): TOTAL KNEE ARTHROPLASTY (Right) as a surgical intervention .  The patient's history has been reviewed, patient examined, no change in status, stable for surgery.  I have reviewed the patient's chart and labs.  Questions were answered to the patient's satisfaction.     Diogenes Whirley, Cloyde Reams

## 2017-05-10 NOTE — H&P (View-Only) (Signed)
TOTAL KNEE ADMISSION H&P  Patient is being admitted for right total knee arthroplasty.  Subjective:  Chief Complaint:left knee pain.  HPI: Caitlin Wagner, 51 y.o. female, has a history of pain and functional disability in the right knee due to arthritis and has failed non-surgical conservative treatments for greater than 12 weeks to include NSAID's and/or analgesics, corticosteriod injections, flexibility and strengthening excercises, use of assistive devices, weight reduction as appropriate and activity modification.  Onset of symptoms was gradual, starting >10 years ago with gradually worsening course since that time. The patient noted no past surgery on the left knee(s).  Patient currently rates pain in the right knee(s) at 10 out of 10 with activity. Patient has night pain, worsening of pain with activity and weight bearing, pain that interferes with activities of daily living, pain with passive range of motion, crepitus and joint swelling.  Patient has evidence of subchondral cysts, subchondral sclerosis, periarticular osteophytes, joint subluxation and joint space narrowing by imaging studies. There is no active infection.      Patient Active Problem List   Diagnosis Date Noted  . Rheumatoid arthritis(714.0)        Past Medical History:  Diagnosis Date  . 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 Rheumatoid arthritis(714.0)          Past Surgical History:  Procedure Laterality Date  . feet reconstruction surgery     bilateral   . TONSILLECTOMY  age 44     (Not in a hospital admission)      Allergies  Allergen Reactions  . Remicade [Infliximab] Anaphylaxis  . Codeine     Extreme feeling of something crawling         Social History  Substance Use Topics  . Smoking  status: Former Smoker    Packs/day: 0.30  . Smokeless tobacco: Never Used  . Alcohol use No         Family History  Problem Relation Age of Onset  . Heart disease Father        heart attack at age 47  . Diverticulitis Father   . Diabetes Paternal Grandmother   . Heart disease Paternal Grandfather   . Heart failure Maternal Grandmother   . Cancer Maternal Grandfather        ? type     Review of Systems  Constitutional: Negative.   HENT: Negative.   Eyes: Negative.   Respiratory: Negative.   Cardiovascular: Negative.   Gastrointestinal: Negative.   Musculoskeletal: Positive for joint pain.  Skin: Negative.   Neurological: Negative.   Endo/Heme/Allergies: Positive for environmental allergies.  Psychiatric/Behavioral: Negative.     Objective:  Physical Exam  Vitals reviewed. Constitutional: She is oriented to person, place, and time. She appears well-developed.  HENT:  Head: Normocephalic and atraumatic.  Eyes: Conjunctivae and EOM are normal. Pupils are equal, round, and reactive to light.  Neck: Normal range of motion. Neck supple.  Cardiovascular: Normal rate, regular rhythm and intact distal pulses.   Respiratory: She is in respiratory distress.  GI: Soft. She exhibits no distension.  Genitourinary:  Genitourinary Comments: deferred  Musculoskeletal:       Right knee: She exhibits swelling, effusion and ecchymosis. Tenderness found. Medial joint line and lateral joint line tenderness noted.  Neurological: She is alert and oriented to person, place,  and time. She has normal reflexes.  Skin: Skin is warm and dry.  Psychiatric: She has a normal mood and affect. Her behavior is normal. Judgment and thought content normal.    Vital signs in last 24 hours: @VSRANGES @  Labs:   Estimated body mass index is 28.34 kg/m as calculated from the following:   Height as of 11/16/16: 5\' 1"  (1.549 m).   Weight as of 11/16/16: 68 kg (150 lb).   Imaging  Review Plain radiographs demonstrate severe degenerative joint disease of the right knee(s). The overall alignment is mild valgus. The bone quality appears to be adequate for age and reported activity level.  Assessment/Plan:  End stage arthritis, right knee   The patient history, physical examination, clinical judgment of the provider and imaging studies are consistent with end stage degenerative joint disease of the right knee(s) and total knee arthroplasty is deemed medically necessary. The treatment options including medical management, injection therapy arthroscopy and arthroplasty were discussed at length. The risks and benefits of total knee arthroplasty were presented and reviewed. The risks due to aseptic loosening, infection, stiffness, patella tracking problems, thromboembolic complications and other imponderables were discussed. The patient acknowledged the explanation, agreed to proceed with the plan and consent was signed. Patient is being admitted for inpatient treatment for surgery, pain control, PT, OT, prophylactic antibiotics, VTE prophylaxis, progressive ambulation and ADL's and discharge planning. The patient is planning to be discharged home with home health services

## 2017-05-10 NOTE — Discharge Instructions (Signed)
° °Dr. Lilygrace Rodick °Total Joint Specialist °[facility] °[address] °[place], [place] [address] °[phone] ° °TOTAL KNEE REPLACEMENT POSTOPERATIVE DIRECTIONS ° ° ° °Knee Rehabilitation, Guidelines Following Surgery  °Results after knee surgery are often greatly improved when you follow the exercise, range of motion and muscle strengthening exercises prescribed by your doctor. Safety measures are also important to protect the knee from further injury. Any time any of these exercises cause you to have increased pain or swelling in your knee joint, decrease the amount until you are comfortable again and slowly increase them. If you have problems or questions, call your caregiver or physical therapist for advice.  ° °WEIGHT BEARING °Weight bearing as tolerated with assist device (walker, cane, etc) as directed, use it as long as suggested by your surgeon or therapist, typically at least 4-6 weeks. ° °HOME CARE INSTRUCTIONS  °Remove items at home which could result in a fall. This includes throw rugs or furniture in walking pathways.  °Continue medications as instructed at time of discharge. °You may have some home medications which will be placed on hold until you complete the course of blood thinner medication.  °You may start showering once you are discharged home but do not submerge the incision under water. Just pat the incision dry and apply a dry gauze dressing on daily. °Walk with walker as instructed.  °You may resume a sexual relationship in one month or when given the OK by your doctor.  °· Use walker as long as suggested by your caregivers. °· Avoid periods of inactivity such as sitting longer than an hour when not asleep. This helps prevent blood clots.  °You may put full weight on your legs and walk as much as is comfortable.  °You may return to work once you are cleared by your doctor.  °Do not drive a car for 6 weeks or until released by you surgeon.  °· Do not drive  while taking narcotics.  °Wear the elastic stockings for three weeks following surgery during the day but you may remove then at night. °Make sure you keep all of your appointments after your operation with all of your doctors and caregivers. You should call the office at the above phone number and make an appointment for approximately two weeks after the date of your surgery. °Do not remove your surgical dressing. The dressing is waterproof; you may take showers in 3 days, but do not take tub baths or submerge the dressing. °Please pick up a stool softener and laxative for home use as long as you are requiring pain medications. °· ICE to the affected knee every three hours for 30 minutes at a time and then as needed for pain and swelling.  Continue to use ice on the knee for pain and swelling from surgery. You may notice swelling that will progress down to the foot and ankle.  This is normal after surgery.  Elevate the leg when you are not up walking on it.   °It is important for you to complete the blood thinner medication as prescribed by your doctor. °· Continue to use the breathing machine which will help keep your temperature down.  It is common for your temperature to cycle up and down following surgery, especially at night when you are not up moving around and exerting yourself.  The breathing machine keeps your lungs expanded and your temperature down. ° °RANGE OF MOTION AND STRENGTHENING EXERCISES  °Rehabilitation of the knee is important following   a knee injury or an operation. After just a few days of immobilization, the muscles of the thigh which control the knee become weakened and shrink (atrophy). Knee exercises are designed to build up the tone and strength of the thigh muscles and to improve knee motion. Often times heat used for twenty to thirty minutes before working out will loosen up your tissues and help with improving the range of motion but do not use heat for the first two weeks following  surgery. These exercises can be done on a training (exercise) mat, on the floor, on a table or on a bed. Use what ever works the best and is most comfortable for you Knee exercises include:  °Leg Lifts - While your knee is still immobilized in a splint or cast, you can do straight leg raises. Lift the leg to 60 degrees, hold for 3 sec, and slowly lower the leg. Repeat 10-20 times 2-3 times daily. Perform this exercise against resistance later as your knee gets better.  °Quad and Hamstring Sets - Tighten up the muscle on the front of the thigh (Quad) and hold for 5-10 sec. Repeat this 10-20 times hourly. Hamstring sets are done by pushing the foot backward against an object and holding for 5-10 sec. Repeat as with quad sets.  °A rehabilitation program following serious knee injuries can speed recovery and prevent re-injury in the future due to weakened muscles. Contact your doctor or a physical therapist for more information on knee rehabilitation.  ° °SKILLED REHAB INSTRUCTIONS: °If the patient is transferred to a skilled rehab facility following release from the hospital, a list of the current medications will be sent to the facility for the patient to continue.  When discharged from the skilled rehab facility, please have the facility set up the patient's Home Health Physical Therapy prior to being released. Also, the skilled facility will be responsible for providing the patient with their medications at time of release from the facility to include their pain medication, the muscle relaxants, and their blood thinner medication. If the patient is still at the rehab facility at time of the two week follow up appointment, the skilled rehab facility will also need to assist the patient in arranging follow up appointment in our office and any transportation needs. ° °MAKE SURE YOU:  °Understand these instructions.  °Will watch your condition.  °Will get help right away if you are not doing well or get worse.   ° ° °Pick up stool softner and laxative for home use following surgery while on pain medications. °Do NOT remove your dressing. You may shower.  °Do not take tub baths or submerge incision under water. °May shower starting three days after surgery. °Please use a clean towel to pat the incision dry following showers. °Continue to use ice for pain and swelling after surgery. °Do not use any lotions or creams on the incision until instructed by your surgeon. ° °

## 2017-05-10 NOTE — Anesthesia Preprocedure Evaluation (Signed)
Anesthesia Evaluation  Patient identified by MRN, date of birth, ID band Patient awake    Reviewed: Allergy & Precautions, NPO status , Patient's Chart, lab work & pertinent test results  History of Anesthesia Complications Negative for: history of anesthetic complications  Airway Mallampati: I  TM Distance: >3 FB Neck ROM: Full    Dental  (+) Teeth Intact,    Pulmonary former smoker,    breath sounds clear to auscultation       Cardiovascular Exercise Tolerance: Good negative cardio ROS Normal cardiovascular exam Rhythm:Regular Rate:Normal     Neuro/Psych negative neurological ROS  negative psych ROS   GI/Hepatic negative GI ROS, Neg liver ROS,   Endo/Other  negative endocrine ROS  Renal/GU negative Renal ROS     Musculoskeletal  (+) Arthritis , Rheumatoid disorders,    Abdominal   Peds  Hematology negative hematology ROS (+)   Anesthesia Other Findings   Reproductive/Obstetrics                             Anesthesia Physical Anesthesia Plan  ASA: II  Anesthesia Plan: MAC, Spinal and Regional   Post-op Pain Management:    Induction:   PONV Risk Score and Plan: 2 and Ondansetron and Dexamethasone  Airway Management Planned: Nasal Cannula  Additional Equipment: None  Intra-op Plan:   Post-operative Plan:   Informed Consent: I have reviewed the patients History and Physical, chart, labs and discussed the procedure including the risks, benefits and alternatives for the proposed anesthesia with the patient or authorized representative who has indicated his/her understanding and acceptance.   Dental advisory given  Plan Discussed with: CRNA and Surgeon  Anesthesia Plan Comments:         Anesthesia Quick Evaluation

## 2017-05-10 NOTE — Progress Notes (Signed)
Pt having n/v; emesis x1; pt report feeling better after vomiting; diet changed to clear liquid for now and will upgrade when necessary. Pt sitting up in chair with call light within reach and partner at bedside. Will continue to closely monitor. Dionne Bucy RN

## 2017-05-10 NOTE — Progress Notes (Signed)
Pt admitted to the unit from pacu; pt A&O x4; IV intact and transfusing; foot pumps on; pt MAE x4 pt report decreased sensation and tingling to BLE; RLE has clean, dry and intact compression dsg with no stain or active drainage noted. Pt oriented to the unit and room; fall/safety precaution/prevention education completed with pt and partner at bedside; all voices understanding and denies any questions. Pt due to void per report; pt in bed with call light within reach and family remains at bedside. Will closely monitor. Dionne Bucy RN

## 2017-05-10 NOTE — Evaluation (Signed)
Physical Therapy Evaluation Patient Details Name: Caitlin Wagner MRN: [id number] DOB: November 01, 1965 Today's Date: 05/10/2017   History of Present Illness  Pt admitted 7/23 for elective R TKA. Pt with PMH of RA and PSH of L TKA 7 weeks ago.  Clinical Impression  Pt is s/p TKA resulting in the deficits listed below (see PT Problem List). Pt tolerated OOB extremely well for first time up and demo's good quad set and 90 deg of knee flexion in R knee. Pt will benefit from skilled PT to increase their independence and safety with mobility to allow discharge to the venue listed below.      Follow Up Recommendations Home health PT;Supervision/Assistance - 24 hour    Equipment Recommendations  None recommended by PT    Recommendations for Other Services       Precautions / Restrictions Precautions Precautions: Knee Precaution Booklet Issued: No Precaution Comments: pt had L TKA done 7 weeks ago and aware of precautions Restrictions Weight Bearing Restrictions: Yes RLE Weight Bearing: Weight bearing as tolerated      Mobility  Bed Mobility Overal bed mobility: Modified Independent             General bed mobility comments: HOB elevated but no use of hand rail, indep with management of R LE  Transfers Overall transfer level: Needs assistance Equipment used: Rolling walker (2 wheeled) Transfers: Sit to/from Stand Sit to Stand: Min guard         General transfer comment: v/c's to push up from bed not pull up on walker. had to use grab bar in bathroom to pull up from  Ambulation/Gait Ambulation/Gait assistance: Min guard Ambulation Distance (Feet): 10 Feet (x2 (to/from bathroom)) Assistive device: Rolling walker (2 wheeled) Gait Pattern/deviations: Step-to pattern;Decreased stride length;Antalgic Gait velocity: slow Gait velocity interpretation: Below normal speed for age/gender General Gait Details: pt with no knee buckling, mild numbness on bottom of foot  Stairs             Wheelchair Mobility    Modified Rankin (Stroke Patients Only)       Balance Overall balance assessment: No apparent balance deficits (not formally assessed)                                           Pertinent Vitals/Pain Pain Assessment: 0-10 Pain Score: 2  Pain Location: R knee Pain Descriptors / Indicators: Sore Pain Intervention(s): Monitored during session    Home Living Family/patient expects to be discharged to:: Private residence Living Arrangements: Spouse/significant other Available Help at Discharge: Family;Available 24 hours/day Type of Home: House Home Access: Stairs to enter Entrance Stairs-Rails: Right (and then 2 without rail) Entrance Stairs-Number of Steps: 5 Home Layout: One level Home Equipment: Walker - 2 wheels;Bedside commode;Shower seat Additional Comments: pt has moved since her last TKA 7 weeks ago    Prior Function Level of Independence: Independent         Comments: Works as a Scientist, clinical (histocompatibility and immunogenetics)   Dominant Hand: Right    Extremity/Trunk Assessment   Upper Extremity Assessment Upper Extremity Assessment: Overall WFL for tasks assessed (except deformities in hand from RA)    Lower Extremity Assessment Lower Extremity Assessment: RLE deficits/detail RLE Deficits / Details: able to complete quad set and 90 deg of active knee flex    Cervical / Trunk Assessment Cervical / Trunk  Assessment: Normal  Communication   Communication: No difficulties  Cognition Arousal/Alertness: Awake/alert Behavior During Therapy: WFL for tasks assessed/performed Overall Cognitive Status: Within Functional Limits for tasks assessed                                        General Comments General comments (skin integrity, edema, etc.): pt with request to use bathroom, indep with hygiene.    Exercises Total Joint Exercises Ankle Circles/Pumps: AROM;Both;10 reps;Supine Quad Sets:  AROM;Right;10 reps;Supine Heel Slides: AROM;Right;10 reps;Seated   Assessment/Plan    PT Assessment Patient needs continued PT services  PT Problem List Decreased strength;Decreased range of motion;Decreased activity tolerance;Decreased balance;Decreased mobility       PT Treatment Interventions DME instruction;Gait training;Stair training;Functional mobility training;Therapeutic activities;Therapeutic exercise;Balance training    PT Goals (Current goals can be found in the Care Plan section)  Acute Rehab PT Goals Patient Stated Goal: home PT Goal Formulation: With patient Time For Goal Achievement: 05/17/17 Potential to Achieve Goals: Good    Frequency 7X/week   Barriers to discharge        Co-evaluation               AM-PAC PT "6 Clicks" Daily Activity  Outcome Measure Difficulty turning over in bed (including adjusting bedclothes, sheets and blankets)?: None Difficulty moving from lying on back to sitting on the side of the bed? : None Difficulty sitting down on and standing up from a chair with arms (e.g., wheelchair, bedside commode, etc,.)?: None Help needed moving to and from a bed to chair (including a wheelchair)?: A Little Help needed walking in hospital room?: A Little Help needed climbing 3-5 steps with a railing? : A Little 6 Click Score: 21    End of Session Equipment Utilized During Treatment: Gait belt Activity Tolerance: Patient tolerated treatment well Patient left: in chair;with call bell/phone within reach;with family/visitor present Nurse Communication: Mobility status PT Visit Diagnosis: Difficulty in walking, not elsewhere classified (R26.2)    Time: 7510-2585 PT Time Calculation (min) (ACUTE ONLY): 24 min   Charges:   PT Evaluation $PT Eval Moderate Complexity: 1 Procedure PT Treatments $Gait Training: 8-22 mins   PT G Codes:   PT G-Codes **NOT FOR INPATIENT CLASS** Functional Assessment Tool Used: Clinical judgement Functional  Limitation: Mobility: Walking and moving around Mobility: Walking and Moving Around Current Status (I7782): At least 20 percent but less than 40 percent impaired, limited or restricted Mobility: Walking and Moving Around Goal Status [phone]): At least 1 percent but less than 20 percent impaired, limited or restricted    Lewis Shock, PT, DPT Pager #: [phone] Office #: [phone]   Rozell Searing Reika Callanan 05/10/2017, 4:43 PM

## 2017-05-10 NOTE — Transfer of Care (Signed)
Immediate Anesthesia Transfer of Care Note  Patient: Caitlin Wagner  Procedure(s) Performed: Procedure(s): TOTAL KNEE ARTHROPLASTY (Right)  Patient Location: PACU  Anesthesia Type:Spinal and MAC combined with regional for post-op pain  Level of Consciousness: awake, alert , sedated and patient cooperative  Airway & Oxygen Therapy: Patient Spontanous Breathing and Patient connected to nasal cannula oxygen  Post-op Assessment: Report given to RN and Post -op Vital signs reviewed and stable  Post vital signs: Reviewed and stable  Last Vitals:  Vitals:   05/10/17 1010 05/10/17 1015  BP: 95/62 (!) 86/44  Pulse: 75 80  Resp: 18 15  Temp:      Last Pain:  Vitals:   05/10/17 0910  TempSrc: Oral         Complications: No apparent anesthesia complications

## 2017-05-10 NOTE — Op Note (Signed)
OPERATIVE REPORT  SURGEON: Samson Frederic, MD   ASSISTANT: Hart Carwin, RNFA.  PREOPERATIVE DIAGNOSIS: Right knee arthritis.   POSTOPERATIVE DIAGNOSIS: Right knee arthritis.   PROCEDURE: Right total knee arthroplasty.   IMPLANTS: Stryker Triathlon CR femur, size 3. Stryker Tritanium tibia, size 2. X3 polyethelyene insert, size 9 mm, CR. 3 button asymmetric patella, size 32 mm.  ANESTHESIA:  Regional and Spinal  TOURNIQUET TIME: Not utilized.   ESTIMATED BLOOD LOSS: 200 mL.  ANTIBIOTICS: 2 g Ancef.  DRAINS: None.  COMPLICATIONS: None   CONDITION: PACU - hemodynamically stable.   BRIEF CLINICAL NOTE: Caitlin Wagner is a 51 y.o. female with a long-standing history of Right knee arthritis. After failing conservative management, the patient was indicated for total knee arthroplasty. The risks, benefits, and alternatives to the procedure were explained, and the patient elected to proceed.  PROCEDURE IN DETAIL: Adductor canal block was obtained in the pre-op holding area. Once inside the operative room, spinal anesthesia was obtained, and a foley catheter was inserted. The patient was then positioned, a nonsterile tourniquet was placed, and the lower extremity was prepped and draped in the normal sterile surgical fashion. A time-out was called verifying side and site of surgery. The patient received IV antibiotics within 60 minutes of beginning the procedure. The tourniquet was not utilized.  An anterior approach to the knee was performed utilizing a midvastus arthrotomy. A medial release was performed and the patellar fat pad was excised. Stryker navigation was used to cut the distal femur perpendicular to the mechanical axis. A freehand patellar resection was performed, and the patella was sized an prepared with 3 lug holes.  Nagivation was used to make a neutral proximal tibia  resection, taking 4 mm of bone from the less affected lateral side with 3 degrees of slope. The menisci were excised. A spacer block was placed, and the alignment and balance in extension were confirmed.   The distal femur was sized using the 3-degree external rotation guide referencing the posterior femoral cortex. The appropriate 4-in-1 cutting block was pinned into place. Rotation was checked using Whiteside's line, the epicondylar axis, and then confirmed with a spacer block in flexion. The remaining femoral cuts were performed, taking care to protect the MCL.  The tibia was sized and the trial tray was pinned into place. The remaining trail components were inserted. The knee was stable to varus and valgus stress through a full range of motion. The patella tracked centrally, and the PCL was well balanced. The trial components were removed, and the proximal tibial surface was prepared. Final components were impacted into place. The knee was tested for a final time and found to be well balanced.  The wound was copiously irrigated with a dilute betadine solution followed by normal saline with pulse lavage. Marcaine solution was injected into the periarticular soft tissue. The wound was closed in layers using #1 Vicryl and Stratafix for the fascia, 2-0 Vicryl for the subcutaneous fat, 2-0 Monocryl for the deep dermal layer, 3-0 running Monocryl subcuticular Stitch, and Dermabond for the skin. Once the glue was fully dried, an Aquacell Ag and compressive dressing were applied. Tthe patient was transported to the recovery room in stable condition. Sponge, needle, and instrument counts were correct at the end of the case x2. The patient tolerated the procedure well and there were no known complications.

## 2017-05-11 ENCOUNTER — Encounter (HOSPITAL_COMMUNITY): Payer: Self-pay | Admitting: Orthopedic Surgery

## 2017-05-11 DIAGNOSIS — M1711 Unilateral primary osteoarthritis, right knee: Secondary | ICD-10-CM | POA: Diagnosis not present

## 2017-05-11 LAB — CBC
HCT: 33.9 % — ABNORMAL LOW (ref 36.0–46.0)
Hemoglobin: 10.6 g/dL — ABNORMAL LOW (ref 12.0–15.0)
MCH: 28.8 pg (ref 26.0–34.0)
MCHC: 31.3 g/dL (ref 30.0–36.0)
MCV: 92.1 fL (ref 78.0–100.0)
Platelets: 206 10*3/uL (ref 150–400)
RBC: 3.68 MIL/uL — ABNORMAL LOW (ref 3.87–5.11)
RDW: 15 % (ref 11.5–15.5)
WBC: 8 10*3/uL (ref 4.0–10.5)

## 2017-05-11 LAB — BASIC METABOLIC PANEL
Anion gap: 8 (ref 5–15)
BUN: 8 mg/dL (ref 6–20)
CO2: 22 mmol/L (ref 22–32)
Calcium: 8.1 mg/dL — ABNORMAL LOW (ref 8.9–10.3)
Chloride: 108 mmol/L (ref 101–111)
Creatinine, Ser: 0.42 mg/dL — ABNORMAL LOW (ref 0.44–1.00)
GFR calc Af Amer: >60 mL/min (ref >60)
GFR calc non Af Amer: >60 mL/min (ref >60)
Glucose, Bld: 97 mg/dL (ref 65–99)
Potassium: 3.5 mmol/L (ref 3.5–5.1)
Sodium: 138 mmol/L (ref 135–145)

## 2017-05-11 MED ORDER — HYDROCODONE-ACETAMINOPHEN 5-325 MG PO TABS: 1.0000 | ORAL_TABLET | ORAL | 0 refills | Status: DC | PRN

## 2017-05-11 MED ORDER — OXYCODONE HCL ER 10 MG PO T12A: 10.0000 mg | EXTENDED_RELEASE_TABLET | ORAL | 0 refills | Status: DC

## 2017-05-11 MED ORDER — ASPIRIN 81 MG PO CHEW: 81.0000 mg | CHEWABLE_TABLET | Freq: Two times a day (BID) | ORAL | 1 refills | Status: DC

## 2017-05-11 MED ORDER — ONDANSETRON HCL 4 MG PO TABS: 4.0000 mg | ORAL_TABLET | Freq: Four times a day (QID) | ORAL | 0 refills | Status: DC | PRN

## 2017-05-11 NOTE — Progress Notes (Signed)
Physical Therapy Treatment Patient Details Name: Caitlin Wagner MRN: [id number] DOB: 24-Dec-1965 Today's Date: 05/11/2017    History of Present Illness Pt admitted 7/23 for elective R TKA. Pt with PMH of RA and PSH of L TKA 7 weeks ago.    PT Comments    Pt progressing towards PT goals. No pain with gait or stairs. Able to safely navigate stairs with minimal cues. Discharge plan remains appropriate.    Follow Up Recommendations  Home health PT;Supervision for mobility/OOB     Equipment Recommendations  None recommended by PT    Recommendations for Other Services       Precautions / Restrictions Precautions Precautions: Knee Precaution Booklet Issued: No Restrictions Weight Bearing Restrictions: Yes RLE Weight Bearing: Weight bearing as tolerated    Mobility  Bed Mobility               General bed mobility comments: in chair on arrival  Transfers Overall transfer level: Modified independent Equipment used: Rolling walker (2 wheeled) Transfers: Sit to/from Stand Sit to Stand: Modified independent (Device/Increase time)         General transfer comment: pt mod independent; able to transfer safely with use of assistive device  Ambulation/Gait Ambulation/Gait assistance: Supervision Ambulation Distance (Feet): 200 Feet Assistive device: Rolling walker (2 wheeled) (youth) Gait Pattern/deviations: Step-through pattern;Decreased stride length Gait velocity: appropriate for POD#1 hip surgery (2.34 feet/sec ) Gait velocity interpretation: Below normal speed for age/gender General Gait Details: able to use heel-toe gait pattern, youth walker necessary for optimal posture.   Stairs Stairs: Yes   Stair Management: One rail Right;No rails;Step to pattern;Forwards;With walker (Completed 2-3 stairs with rail; 1 platform step with RW) Number of Stairs: 3 General stair comments: Pt able to safely navigate stairs with railing and RW. Used RW for platform step, showed  both forward and backward technique but patient was more comfortable with forward.  Wheelchair Mobility    Modified Rankin (Stroke Patients Only)       Balance Overall balance assessment: No apparent balance deficits (not formally assessed)                                          Cognition Arousal/Alertness: Awake/alert Behavior During Therapy: WFL for tasks assessed/performed Overall Cognitive Status: Within Functional Limits for tasks assessed                                 General Comments: No cognition issues noted      Exercises Total Joint Exercises Heel Slides:  (3 reps)    General Comments        Pertinent Vitals/Pain Pain Assessment: No/denies pain    Home Living Family/patient expects to be discharged to:: Private residence Living Arrangements: Spouse/significant other;Children Available Help at Discharge: Family;Available 24 hours/day Type of Home: House Home Access: Stairs to enter Entrance Stairs-Rails: Right Home Layout: One level Home Equipment: Walker - 2 wheels;Bedside commode;Shower seat Additional Comments: pt has moved since her last TKA 7 weeks ago    Prior Function Level of Independence: Independent      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   PT Goals (current goals can now be found in the care plan section) Acute Rehab PT Goals Patient Stated Goal: home PT Goal Formulation: With patient Time For Goal Achievement: 05/17/17 Potential to Achieve  Goals: Good Progress towards PT goals: Progressing toward goals;Goals met and updated - see care plan    Frequency    7X/week      PT Plan Current plan remains appropriate    Co-evaluation              AM-PAC PT "6 Clicks" Daily Activity  Outcome Measure  Difficulty turning over in bed (including adjusting bedclothes, sheets and blankets)?: None Difficulty moving from lying on back to sitting on the side of the bed? : None Difficulty sitting  down on and standing up from a chair with arms (e.g., wheelchair, bedside commode, etc,.)?: None Help needed moving to and from a bed to chair (including a wheelchair)?: None Help needed walking in hospital room?: None Help needed climbing 3-5 steps with a railing? : A Little 6 Click Score: 23    End of Session Equipment Utilized During Treatment: Gait belt Activity Tolerance: Patient tolerated treatment well Patient left: in chair;with call bell/phone within reach Nurse Communication: Mobility status PT Visit Diagnosis: Difficulty in walking, not elsewhere classified (R26.2)     Time: 0929-5747 PT Time Calculation (min) (ACUTE ONLY): 18 min  Charges:  $Gait Training: 8-22 mins                    G Codes:       Rosalyn Charters, SPTA Office [phone]   Rosalyn Charters 05/11/2017, 3:20 PM

## 2017-05-11 NOTE — Evaluation (Signed)
Occupational Therapy Evaluation Patient Details Name: Caitlin Wagner MRN: [id number] DOB: 02-07-1966 Today's Date: 05/11/2017    History of Present Illness Pt admitted 7/23 for elective R TKA. Pt with PMH of RA and PSH of L TKA 7 weeks ago.   Clinical Impression   Patient evaluated by Occupational Therapy with no further acute OT needs identified. All education has been completed and the patient has no further questions. See below for any follow-up Occupational Therapy or equipment needs. OT to sign off. Thank you for referral.      Follow Up Recommendations  No OT follow up    Equipment Recommendations  3 in 1 bedside commode    Recommendations for Other Services       Precautions / Restrictions Precautions Precautions: Knee Precaution Booklet Issued: No Restrictions Weight Bearing Restrictions: Yes RLE Weight Bearing: Weight bearing as tolerated      Mobility Bed Mobility               General bed mobility comments: in chair on arrival  Transfers Overall transfer level: Modified independent Equipment used: Rolling walker (2 wheeled) Transfers: Sit to/from Stand Sit to Stand: Modified independent (Device/Increase time)         General transfer comment: pt mod independent; able to transfer safely with use of assistive device    Balance Overall balance assessment: No apparent balance deficits (not formally assessed)                                         ADL either performed or assessed with clinical judgement   ADL Overall ADL's : Independent                                       General ADL Comments: pt demonstrates ability to touch toes and ability to complete shower transfer with 3n1  Pt educated on bathing and avoid washing directly on incision. Pt educated to use new wash cloth and towel each day. Pt educated to allow water to run across dressing and not to soak in a tub at this time. .      Vision Baseline  Vision/History: Wears glasses Wears Glasses: At all times       Perception     Praxis      Pertinent Vitals/Pain Pain Assessment: No/denies pain     Hand Dominance Right   Extremity/Trunk Assessment Upper Extremity Assessment Upper Extremity Assessment: Overall WFL for tasks assessed   Lower Extremity Assessment Lower Extremity Assessment: Defer to PT evaluation   Cervical / Trunk Assessment Cervical / Trunk Assessment: Normal   Communication Communication Communication: No difficulties   Cognition Arousal/Alertness: Awake/alert Behavior During Therapy: WFL for tasks assessed/performed Overall Cognitive Status: Within Functional Limits for tasks assessed                                 General Comments: No cognition issues noted   General Comments       Exercises Exercises: Total Joint Total Joint Exercises Heel Slides:  (3 reps)   Shoulder Instructions      Home Living Family/patient expects to be discharged to:: Private residence Living Arrangements: Spouse/significant other;Children Available Help at Discharge: Family;Available 24 hours/day Type of Home: House Home  Access: Stairs to enter [employer] of Steps: 5 Entrance Stairs-Rails: Right Home Layout: One level     Bathroom Shower/Tub: Producer, television/film/video: Standard     Home Equipment: Environmental consultant - 2 wheels;Bedside commode;Shower seat   Additional Comments: pt has moved since her last TKA 7 weeks ago      Prior Functioning/Environment Level of Independence: Independent        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 Problem List:        OT Treatment/Interventions:      OT Goals(Current goals can be found in the care plan section) Acute Rehab OT Goals Patient Stated Goal: home  OT Frequency:     Barriers to D/C:            Co-evaluation              AM-PAC PT "6 Clicks" Daily Activity     Outcome Measure Help from another  person eating meals?: None Help from another person taking care of personal grooming?: None Help from another person toileting, which includes using toliet, bedpan, or urinal?: None Help from another person bathing (including washing, rinsing, drying)?: None Help from another person to put on and taking off regular upper body clothing?: None Help from another person to put on and taking off regular lower body clothing?: None 6 Click Score: 24   End of Session Equipment Utilized During Treatment: Rolling walker Nurse Communication: Mobility status;Precautions  Activity Tolerance: Patient tolerated treatment well Patient left: in chair;with call bell/phone within reach  OT Visit Diagnosis: Unsteadiness on feet (R26.81)                Time: 7026-3785 OT Time Calculation (min): 10 min Charges:  OT General Charges $OT Visit: 1 Procedure OT Evaluation $OT Eval Moderate Complexity: 1 Procedure G-Codes: OT G-codes **NOT FOR INPATIENT CLASS** Functional Assessment Tool Used: Clinical judgement Functional Limitation: Self care Self Care Current Status (Y8502): 0 percent impaired, limited or restricted Self Care Goal Status (D7412): 0 percent impaired, limited or restricted Self Care Discharge Status (I7867): 0 percent impaired, limited or restricted    Mateo Flow   OTR/L Pager: [phone] Office: [phone] .   Boone Master B 05/11/2017, 3:02 PM

## 2017-05-11 NOTE — Care Management Obs Status (Signed)
MEDICARE OBSERVATION STATUS NOTIFICATION   Patient Details  Name: Caitlin Wagner MRN: [id number] Date of Birth: [birth year]   Medicare Observation Status Notification Given:  Yes    Durenda Guthrie, RN 05/11/2017, 1:11 PM

## 2017-05-11 NOTE — Progress Notes (Signed)
Physical Therapy Treatment Patient Details Name: Caitlin Wagner MRN: [id number] DOB: 07/04/1966 Today's Date: 05/11/2017    History of Present Illness Pt admitted 7/23 for elective R TKA. Pt with PMH of RA and PSH of L TKA 7 weeks ago.    PT Comments    Progressing toward PT goals. ROM excellent for POD #2. Required youth walker for optimal gait kinematics and posturing during ambulation. Progress to stairs next tx and further distance for gait.   Follow Up Recommendations  Home health PT;Supervision for mobility/OOB     Equipment Recommendations  None recommended by PT    Recommendations for Other Services       Precautions / Restrictions Precautions Precautions: Knee Precaution Booklet Issued: Yes (comment) Precaution Comments: Reviewed exercise portion of booklet Restrictions Weight Bearing Restrictions: Yes RLE Weight Bearing: Weight bearing as tolerated    Mobility  Bed Mobility Overal bed mobility: Independent             General bed mobility comments: Bed flatened, no assistance from bed rail or therapist to sit from supine  Transfers Overall transfer level: Needs assistance Equipment used: Rolling walker (2 wheeled) Transfers: Sit to/from Stand Sit to Stand: Supervision         General transfer comment: Supervision for safety. Pt properly stood; didn't pull from walker to stand  Ambulation/Gait Ambulation/Gait assistance: Supervision Ambulation Distance (Feet): 125 Feet Assistive device: Rolling walker (2 wheeled) (youth) Gait Pattern/deviations: Step-through pattern;Decreased stride length     General Gait Details: No buckling from R knee. Required youth walker due to adult providing poor posture.   Stairs            Wheelchair Mobility    Modified Rankin (Stroke Patients Only)       Balance Overall balance assessment: No apparent balance deficits (not formally assessed)                                           Cognition Arousal/Alertness: Awake/alert Behavior During Therapy: WFL for tasks assessed/performed Overall Cognitive Status: Within Functional Limits for tasks assessed                                 General Comments: Pleasant throughout tx. no cognition issues noted      Exercises Total Joint Exercises Ankle Circles/Pumps: AROM;Both;10 reps;Supine Quad Sets: AROM;Right;10 reps;Supine Straight Leg Raises: AROM;Strengthening;Right;10 reps;Supine Goniometric ROM: 15-91 R knee (supine)    General Comments General comments (skin integrity, edema, etc.): pt supervision with transfer to commode and hygiene. spouse washed pt's R hand as pt with L IV in hand      Pertinent Vitals/Pain Pain Assessment: No/denies pain    Home Living                      Prior Function            PT Goals (current goals can now be found in the care plan section) Acute Rehab PT Goals Patient Stated Goal: home PT Goal Formulation: With patient Time For Goal Achievement: 05/17/17 Potential to Achieve Goals: Good Progress towards PT goals: Progressing toward goals    Frequency    7X/week      PT Plan Current plan remains appropriate    Co-evaluation  AM-PAC PT "6 Clicks" Daily Activity  Outcome Measure  Difficulty turning over in bed (including adjusting bedclothes, sheets and blankets)?: None Difficulty moving from lying on back to sitting on the side of the bed? : None Difficulty sitting down on and standing up from a chair with arms (e.g., wheelchair, bedside commode, etc,.)?: None Help needed moving to and from a bed to chair (including a wheelchair)?: A Little Help needed walking in hospital room?: A Little Help needed climbing 3-5 steps with a railing? : A Little 6 Click Score: 21    End of Session Equipment Utilized During Treatment: Gait belt Activity Tolerance: Patient tolerated treatment well Patient left: in chair;with call  bell/phone within reach;with family/visitor present   PT Visit Diagnosis: Difficulty in walking, not elsewhere classified (R26.2)     Time: 3419-3790 PT Time Calculation (min) (ACUTE ONLY): 32 min  Charges:  $Gait Training: 8-22 mins $Therapeutic Exercise: 8-22 mins                    G Codes:       Sebastian Ache, SPTA Office [phone]   Sebastian Ache 05/11/2017, 9:38 AM

## 2017-05-11 NOTE — Anesthesia Postprocedure Evaluation (Signed)
Anesthesia Post Note  Patient: Caitlin Wagner  Procedure(s) Performed: Procedure(s) (LRB): TOTAL KNEE ARTHROPLASTY (Right)     Patient location during evaluation: PACU Anesthesia Type: Regional, MAC and Spinal Level of consciousness: awake and alert Pain management: pain level controlled Vital Signs Assessment: post-procedure vital signs reviewed and stable Respiratory status: spontaneous breathing, nonlabored ventilation, respiratory function stable and patient connected to nasal cannula oxygen Cardiovascular status: stable and blood pressure returned to baseline Postop Assessment: no signs of nausea or vomiting and spinal receding Anesthetic complications: no    Last Vitals:  Vitals:   05/11/17 0016 05/11/17 0510  BP: 106/65 98/60  Pulse: 87 80  Resp: 16 16  Temp: (!) 36.4 C 36.6 C    Last Pain:  Vitals:   05/11/17 0510  TempSrc: Oral  PainSc:                  Conswella Bruney

## 2017-05-11 NOTE — Progress Notes (Signed)
   Subjective:  Patient reports pain as mild.  Had emesis x1 yesterday. No N/V/CP/SOB today. Wants to go home.  Objective:   VITALS:   Vitals:   05/10/17 1456 05/10/17 2130 05/11/17 0016 05/11/17 0510  BP: 94/60 (!) 99/57 106/65 98/60  Pulse:  77 87 80  Resp: 15 18 16 16   Temp: 97.7 F (36.5 C) 97.7 F (36.5 C) (!) 97.5 F (36.4 C) 97.9 F (36.6 C)  TempSrc: Oral Oral Oral Oral  SpO2: 100% 99% 99% 95%  Weight:       NAD ABD soft Sensation intact distally Intact pulses distally Dorsiflexion/Plantar flexion intact Incision: dressing C/D/I Compartment soft   Lab Results  Component Value Date   WBC 8.0 05/11/2017   HGB 10.6 (L) 05/11/2017   HCT 33.9 (L) 05/11/2017   MCV 92.1 05/11/2017   PLT 206 05/11/2017   BMET    Component Value Date/Time   NA 138 05/11/2017 0340   K 3.5 05/11/2017 0340   CL 108 05/11/2017 0340   CO2 22 05/11/2017 0340   GLUCOSE 97 05/11/2017 0340   BUN 8 05/11/2017 0340   CREATININE 0.42 (L) 05/11/2017 0340   CALCIUM 8.1 (L) 05/11/2017 0340   GFRNONAA >60 05/11/2017 0340   GFRAA >60 05/11/2017 0340     Assessment/Plan: 1 Day Post-Op   Principal Problem:   Degenerative arthritis of right knee Active Problems:   Degenerative joint disease of right knee   WBAT with walker DVT ppx: ASA, SCDs, tEDs PO pain control Antiemetics PRN PT/OT Dispo: D/C home with HHPT if N/V resolves   Janis Cuffe, 05/13/2017 05/11/2017, 7:43 AM   05/13/2017, MD Cell [phone]

## 2017-05-11 NOTE — Care Management Note (Signed)
Case Management Note  Patient Details  Name: Caitlin Wagner MRN: [id number] Date of Birth: Apr 02, 1966  Subjective/Objective:    51 yr old female s/p right total knee arthroplasty.                Action/Plan: Case manager spoke with patient and her husband concerning discharge plan and DME needs. Patient was preoperatively setup with [facility], no changes. She has RW and 3in1, will have family support at discharge.    Expected Discharge Date:  05/11/17               Expected Discharge Plan:  Home w Home Health Services  In-House Referral:  NA  Discharge planning Services  CM Consult  Post Acute Care Choice:  Home Health Choice offered to:  Patient, Spouse  DME Arranged:  N/A (Has RW and 3in1) DME Agency:  NA  HH Arranged:  PT HH Agency:  [facility] (formerly [facility]  Status of Service:  Completed, signed off  If discussed at [employer], dates discussed:    Additional Comments:  Durenda Guthrie, RN 05/11/2017, 1:13 PM

## 2017-05-11 NOTE — Discharge Summary (Signed)
Physician Discharge Summary  Patient ID: BELISSA KOOY MRN: [id number] DOB/AGE: 1966-06-16 51 y.o.  Admit date: 05/10/2017 Discharge date: 05/11/2017  Admission Diagnoses:  Degenerative arthritis of right knee  Discharge Diagnoses:  Principal Problem:   Degenerative arthritis of right knee Active Problems:   Degenerative joint disease of right knee   Past Medical History:  Diagnosis Date  . Arthritis   . 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 Rheumatoid arthritis(714.0)     Surgeries: Procedure(s): TOTAL KNEE ARTHROPLASTY on 05/10/2017   Consultants (if any):   Discharged Condition: Improved  Hospital Course: [name] is an 51 y.o. female who was admitted 05/10/2017 with a diagnosis of Degenerative arthritis of right knee and went to the operating room on 05/10/2017 and underwent the above named procedures.    She was given perioperative antibiotics:  Anti-infectives    Start     Dose/Rate Route Frequency Ordered Stop   05/10/17 1630  ceFAZolin (ANCEF) IVPB 2g/100 mL premix     2 g 200 mL/hr over 30 Minutes Intravenous Every 6 hours 05/10/17 1505 05/10/17 2223   05/10/17 1500  valACYclovir (VALTREX) tablet 500 mg     500 mg Oral Daily 05/10/17 1303     05/10/17 1045  ceFAZolin (ANCEF) IVPB 2g/100 mL premix     2 g 200 mL/hr over 30 Minutes Intravenous On call to O.R. 05/07/17 1210 05/10/17 1036    .  She was given sequential compression devices, early ambulation, and ASA for DVT prophylaxis.  She benefited maximally from the hospital stay and there were no complications.    Recent vital signs:  Vitals:   05/11/17 0016 05/11/17 0510  BP: 106/65 98/60  Pulse: 87 80  Resp: 16 16  Temp: (!) 97.5 F (36.4 C) 97.9 F (36.6 C)    Recent laboratory studies:  Lab  Results  Component Value Date   HGB 10.6 (L) 05/11/2017   HGB 12.3 04/29/2017   HGB 9.8 (L) 03/24/2017   Lab Results  Component Value Date   WBC 8.0 05/11/2017   PLT 206 05/11/2017   No results found for: INR Lab Results  Component Value Date   NA 138 05/11/2017   K 3.5 05/11/2017   CL 108 05/11/2017   CO2 22 05/11/2017   BUN 8 05/11/2017   CREATININE 0.42 (L) 05/11/2017   GLUCOSE 97 05/11/2017    Discharge Medications:   Allergies as of 05/11/2017      Reactions   Remicade [infliximab] Anaphylaxis   Codeine Other (See Comments)   Extreme feeling of something crawling      Medication List    STOP taking these medications   acetaminophen 500 MG tablet Commonly known as:  TYLENOL   leflunomide 20 MG tablet Commonly known as:  ARAVA   XELJANZ 5 MG Tabs Generic drug:  Tofacitinib Citrate     TAKE these medications   aspirin 81 MG chewable tablet Chew 1 tablet (81 mg total) by mouth 2 (two) times daily.   docusate sodium 100 MG capsule Commonly known as:  COLACE Take 1 capsule (100 mg total) by mouth 2 (two) times daily.   GAS-X PO Take 2 tablets by mouth as needed (gas).   HYDROcodone-acetaminophen 5-325 MG tablet Commonly known as:  NORCO/VICODIN Take 1-2 tablets by mouth every  4 (four) hours as needed (breakthrough pain).   ibuprofen 200 MG tablet Commonly known as:  ADVIL,MOTRIN Take 800 mg by mouth 3 (three) times daily as needed (for pain).   ondansetron 4 MG tablet Commonly known as:  ZOFRAN Take 1 tablet (4 mg total) by mouth every 6 (six) hours as needed for nausea.   oxyCODONE 10 mg 12 hr tablet Commonly known as:  OXYCONTIN Take 1 tablet (10 mg total) by mouth PRO. 1 tab PO every 12 hours for 3 days, then 1 tab PO daily for 4 days   senna 8.6 MG Tabs tablet Commonly known as:  SENOKOT Take 2 tablets (17.2 mg total) by mouth at bedtime.   valACYclovir 500 MG tablet Commonly known as:  VALTREX Take 1 tablet (500 mg total) by mouth  daily.       Diagnostic Studies: Dg Knee Right Port  Result Date: 05/10/2017 CLINICAL DATA:  Status post right knee replacement EXAM: PORTABLE RIGHT KNEE - 1-2 VIEW COMPARISON:  07/29/2015 FINDINGS: Right knee prosthesis is now seen in satisfactory position. No acute bony abnormality is seen. Air is noted in the surgical bed. IMPRESSION: Status post right knee replacement without acute abnormality. Electronically Signed   By: Alcide Clever M.D.   On: 05/10/2017 13:31    Disposition: 06-Home-Health Care Svc  Discharge Instructions    Call MD / Call 911    Complete by:  As directed    If you experience chest pain or shortness of breath, CALL 911 and be transported to the hospital emergency room.  If you develope a fever above 101 F, pus (white drainage) or increased drainage or redness at the wound, or calf pain, call your surgeon's office.   Constipation Prevention    Complete by:  As directed    Drink plenty of fluids.  Prune juice may be helpful.  You may use a stool softener, such as Colace (over the counter) 100 mg twice a day.  Use MiraLax (over the counter) for constipation as needed.   Diet - low sodium heart healthy    Complete by:  As directed    Do not put a pillow under the knee. Place it under the heel.    Complete by:  As directed    Driving restrictions    Complete by:  As directed    No driving for 6 weeks   Increase activity slowly as tolerated    Complete by:  As directed    Lifting restrictions    Complete by:  As directed    No lifting for 6 weeks   TED hose    Complete by:  As directed    Use stockings (TED hose) for 2 weeks on both leg(s).  You may remove them at night for sleeping.      Follow-up Information    Briyonna Omara, Arlys John, MD. Schedule an appointment as soon as possible for a visit in 2 weeks.   Specialty:  Orthopedic Surgery Why:  For wound re-check Contact information: [address] [place] [address] [phone]             Signed: Garnet Koyanagi 05/11/2017, 7:48 AM

## 2017-06-27 ENCOUNTER — Emergency Department (HOSPITAL_COMMUNITY): Payer: Medicare Other

## 2017-06-27 ENCOUNTER — Emergency Department (HOSPITAL_COMMUNITY)
Admission: EM | Admit: 2017-06-27 | Discharge: 2017-06-27 | Disposition: A | Discharge status: Home / Self Care | Payer: Medicare Other | Attending: Emergency Medicine | Admitting: Emergency Medicine

## 2017-06-27 ENCOUNTER — Encounter (HOSPITAL_COMMUNITY): Payer: Self-pay | Admitting: *Deleted

## 2017-06-27 DIAGNOSIS — Z87891 Personal history of nicotine dependence: Secondary | ICD-10-CM | POA: Diagnosis not present

## 2017-06-27 DIAGNOSIS — Z79899 Other long term (current) drug therapy: Secondary | ICD-10-CM | POA: Insufficient documentation

## 2017-06-27 DIAGNOSIS — M25551 Pain in right hip: Secondary | ICD-10-CM | POA: Insufficient documentation

## 2017-06-27 DIAGNOSIS — Z96653 Presence of artificial knee joint, bilateral: Secondary | ICD-10-CM | POA: Insufficient documentation

## 2017-06-27 MED ORDER — CYCLOBENZAPRINE HCL 10 MG PO TABS: 10.0000 mg | ORAL_TABLET | Freq: Three times a day (TID) | ORAL | 0 refills | Status: DC | PRN

## 2017-06-27 MED ORDER — KETOROLAC TROMETHAMINE 30 MG/ML IJ SOLN
15.0000 mg | Freq: Once | INTRAMUSCULAR | Status: AC
  Administered 2017-06-27: 15 mg via INTRAMUSCULAR
  Filled 2017-06-27: qty 1

## 2017-06-27 MED ORDER — MELOXICAM 7.5 MG PO TABS: 7.5000 mg | ORAL_TABLET | Freq: Every day | ORAL | 0 refills | Status: AC

## 2017-06-27 MED ORDER — MELOXICAM 7.5 MG PO TABS: 7.5000 mg | ORAL_TABLET | Freq: Every day | ORAL | 0 refills | Status: DC

## 2017-06-27 NOTE — ED Triage Notes (Signed)
Pt complains of lower back and right hip pain. Pt states she injured her hip 3 days ago during sexual intercourse and has had difficulty walking without severe pain. Pt states she has been taking ibuprofen and extra strength tylenol for pain. Pt is to have MRI of her right hip next week but pt feels she cannot wait.

## 2017-06-27 NOTE — ED Provider Notes (Signed)
[facility]-EMERGENCY DEPT Provider Note   CSN: [id number] Arrival date & time: 06/27/17  7829  History   Chief Complaint Chief Complaint  Patient presents with  . Hip Pain   HPI Caitlin Wagner is a 51 y.o. female.  Patient has past medical history of RA who presents with acute onset of R low back pain and R hip pain. Her R hip pain has been going on for about 1 year and has been managed with OTC medications, including extra strength tylenol and ibuprofen 3-4 times per day. She states that her R lower back pain started acutely after having intercourse with her Fiance on Thursday. The pain is described as sharp and fiery. It radiates down from her back into her buttocks, R hip, and R lower extremity. She has noticed some tingling and numbness in her right leg, but denies lower extremity weakness and difficulty walking as a result of leg weakness. The pain has gotten progressively worse over the past few days. It first started with activity but has progressed to the point where she feels pain laying down and is uncomfortable. She has R hip pain at baseline and states that this back/leg pain is a new problem and different from what she normally experiences with her hip.       Past Medical History:  Diagnosis Date  . Arthritis    RA  . 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 Rheumatoid arthritis(714.0)     Patient Active Problem List   Diagnosis Date Noted  . Degenerative arthritis of right knee 05/10/2017  . Degenerative joint disease of right knee 05/10/2017  . Rheumatoid arthritis(714.0)     Past Surgical History:  Procedure Laterality Date  . feet reconstruction surgery     bilateral   . TONSILLECTOMY  age 68  . TOTAL KNEE ARTHROPLASTY Left 03/22/2017   Procedure: TOTAL KNEE ARTHROPLASTY w/  Navigation;  Surgeon: Samson Frederic, MD;  Location: [facility] OR;  Service: Orthopedics;  Laterality: Left;  . TOTAL KNEE ARTHROPLASTY Right 05/10/2017   Procedure: TOTAL KNEE ARTHROPLASTY;  Surgeon: Samson Frederic, MD;  Location: [facility] OR;  Service: Orthopedics;  Laterality: Right;   OB History    Gravida Para Term Preterm AB Living   2 2 2     2    SAB TAB Ectopic Multiple Live Births                 Home Medications    Prior to Admission medications   Medication Sig Start Date End Date Taking? Authorizing Provider  acetaminophen (TYLENOL) 500 MG tablet Take 1,000 mg by mouth every 6 (six) hours as needed for moderate pain.   Yes [provider]  ibuprofen (ADVIL,MOTRIN) 200 MG tablet Take 800 mg by mouth 3 (three) times daily as needed for moderate pain.    Yes [provider]  leflunomide (ARAVA) 20 MG tablet Take 20 mg by mouth daily. 05/08/17  Yes [provider]  Tofacitinib Citrate (XELJANZ) 5 MG TABS Take 1 tablet by mouth 2 (two) times daily.   Yes [provider]  valACYclovir (VALTREX) 500 MG tablet Take 1 tablet (500 mg total) by mouth daily. 03/09/17  Yes 03/11/17, MD  aspirin 81 MG chewable tablet Chew 1 tablet (81 mg total) by mouth 2 (two) times daily. Patient  not taking: Reported on 06/27/2017 05/11/17   Samson Frederic, MD  cyclobenzaprine (FLEXERIL) 10 MG tablet Take 1 tablet (10 mg total) by mouth 3 (three) times daily as needed for muscle spasms. 06/27/17   Rozann Lesches, MD  docusate sodium (COLACE) 100 MG capsule Take 1 capsule (100 mg total) by mouth 2 (two) times daily. Patient not taking: Reported on 04/26/2017 03/23/17   Samson Frederic, MD  HYDROcodone-acetaminophen (NORCO/VICODIN) 5-325 MG tablet Take 1-2 tablets by mouth every 4 (four) hours as needed (breakthrough pain). Patient not taking: Reported on 06/27/2017 05/11/17   Samson Frederic, MD  meloxicam (MOBIC) 7.5 MG tablet Take 1 tablet (7.5 mg total) by mouth daily. Can increase  to two tablets daily if needed. 06/27/17 06/27/18  Rozann Lesches, MD  ondansetron (ZOFRAN) 4 MG tablet Take 1 tablet (4 mg total) by mouth every 6 (six) hours as needed for nausea. Patient not taking: Reported on 06/27/2017 05/11/17   Samson Frederic, MD  oxyCODONE (OXYCONTIN) 10 mg 12 hr tablet Take 1 tablet (10 mg total) by mouth PRO. 1 tab PO every 12 hours for 3 days, then 1 tab PO daily for 4 days Patient not taking: Reported on 06/27/2017 05/11/17   Samson Frederic, MD  senna (SENOKOT) 8.6 MG TABS tablet Take 2 tablets (17.2 mg total) by mouth at bedtime. Patient not taking: Reported on 04/26/2017 03/23/17   Samson Frederic, MD   Family History Family History  Problem Relation Age of Onset  . Heart disease Father        heart attack at age 49  . Diverticulitis Father   . Diabetes Paternal Grandmother   . Heart disease Paternal Grandfather   . Heart failure Maternal Grandmother   . Cancer Maternal Grandfather        ? type    Social History Social History  Substance Use Topics  . Smoking status: Former Smoker    Packs/day: 0.30    Quit date: 03/13/2015  . Smokeless tobacco: Never Used  . Alcohol use No     Allergies   Remicade [infliximab] and Codeine   Review of Systems Review of Systems  Constitutional: Negative for appetite change, chills, fatigue and fever.  HENT: Negative for sinus pain and sinus pressure.   Respiratory: Negative for cough and shortness of breath.   Cardiovascular: Negative for chest pain, palpitations and leg swelling.  Gastrointestinal: Positive for constipation. Negative for abdominal distention, abdominal pain, blood in stool, diarrhea, nausea and vomiting.  Genitourinary: Negative for difficulty urinating, dysuria, flank pain, frequency, hematuria and urgency.  Musculoskeletal: Positive for arthralgias, back pain and myalgias. Negative for gait problem and joint swelling.   Physical Exam Updated Vital Signs BP 127/73 (BP Location: Right Arm)    Pulse 85   Temp 98.2 F (36.8 C) (Oral)   Resp 18   SpO2 96%   Physical Exam  Constitutional:  Patient sitting uncomfortably in bed, tearful but in no acute distress  Cardiovascular: Normal rate, regular rhythm and intact distal pulses.  Exam reveals no friction rub.   No murmur heard. Pulmonary/Chest: Effort normal. No respiratory distress. She has no wheezes.  Abdominal: Soft. She exhibits no distension and no mass. There is no tenderness. There is no guarding.  Musculoskeletal: She exhibits no edema or tenderness.  Patient has tenderness to palpation of R, lower paraspinous muscles and R buttock. No pain on L lower back or point tenderness of vertebral bodies of lower back. No pain in RLE or LLE with straight  leg raise.  Skin: Skin is warm and dry. No rash noted. She is not diaphoretic. No erythema.   ED Treatments / Results  Labs (all labs ordered are listed, but only abnormal results are displayed) Labs Reviewed - No data to display  EKG  EKG Interpretation None      Radiology Dg Hip Unilat With Pelvis 2-3 Views Right  Result Date: 06/27/2017 CLINICAL DATA:  Per pt injured rt hip during sex x 3 days ago, pain now worsening, states scheduled for mri next week EXAM: DG HIP (WITH OR WITHOUT PELVIS) 2-3V RIGHT COMPARISON:  Plain film of the pelvis dated 04/29/2016. FINDINGS: There is no evidence of hip fracture or dislocation. There is no evidence of arthropathy or other focal bone abnormality. Soft tissues about the pelvis and right hip are unremarkable. Mild degenerative change noted within the slightly scoliotic lower lumbar spine. IMPRESSION: 1. No acute findings. No osseous fracture or dislocation. No significant degenerative change seen at the right hip joint. Previous report of 04/29/2016 described mild right hip osteoarthritis as manifested by mild subchondral sclerosis in the right acetabulum, with similar appearance today. 2. Mild degenerative change within the slightly  scoliotic lower lumbar spine. Electronically Signed   By: Bary Richard M.D.   On: 06/27/2017 09:30    Procedures Procedures (including critical care time)  Medications Ordered in ED Medications  ketorolac (TORADOL) 30 MG/ML injection 15 mg (15 mg Intramuscular Given 06/27/17 0846)    Initial Impression / Assessment and Plan / ED Course  I have reviewed the triage vital signs and the nursing notes.  Pertinent labs & imaging results that were available during my care of the patient were reviewed by me and considered in my medical decision making (see chart for details).  Patient reports acute onset of R-sided lower back pain that radiates down her R leg and has gotten progressively worse. The patient does not have any red flag symptoms, including loss of continence and acute loss of motor function or sensation, which would suggest cauda equina. The patient does not have a personal history of cancer, IV drug use, fevers, or tenderness to palpation. All of this is reassuring that she does not have an acute, pathological fracture or possible epidural abscess.   Given her history of RA, it is possible that the patient's acute exacerbation of chronic R hip/back pain is a result of ankylosing spondylitis or OA. Will obtain R hip imaging, including view of pelvis, to rule out fracture and evaluate for above conditions. The patient will be given IM Toradol to help with pain.   The patient has TTP of R lower back and buttocks on PE and her description of fiery burning pain is suggestive of radiculopathy. Her negative straight leg test is less consistent with this diagnosis. If patient's imaging comes back negative, will likely discharge with muscle relaxant, meloxicam, and other measures for supportive care.  Final Clinical Impressions(s) / ED Diagnoses   Final diagnoses:  Right hip pain   Patient's imaging does not reveal acute fracture or dislocation. Patient's pain relieved with IM Toradol. Will  prescribe muscle relaxant and meloxicam for management of OA acute lower back pain. Patient told to stop taking ibuprofen when taking meloxicam. Can continue Tylenol with this medication.   Patient instructed to return for further evaluation if she develops fever, lower extremity weakness, and loss of bowel or bladder function.  Otherwise she was told to keep appointment for R hip MRI next Saturday as scheduled by  her orthopedic surgeon.   New Prescriptions New Prescriptions   CYCLOBENZAPRINE (FLEXERIL) 10 MG TABLET    Take 1 tablet (10 mg total) by mouth 3 (three) times daily as needed for muscle spasms.   MELOXICAM (MOBIC) 7.5 MG TABLET    Take 1 tablet (7.5 mg total) by mouth daily. Can increase to two tablets daily if needed.     Rozann Lesches, MD 06/27/17 1007    Rozann Lesches, MD 06/27/17 1012    Pricilla Loveless, MD 07/01/17 [phone]

## 2017-06-27 NOTE — Discharge Instructions (Signed)
Please take your muscle relaxant (cyclobenzaprine) and anti-inflammatory medication (meloxicam) as prescribed. Please start taking 1 tablet per day and can escalate therapy if needed as outlined on the prescription.   Please follow up with your PCP and orthopedic surgeon regarding management of your OA and possible referral to physical therapy.  Please return to the ED if you develop fevers, acute onset lower extremity weakness, numbness in your buttocks, and/or loss of bowel/bladder function.

## 2017-07-05 ENCOUNTER — Other Ambulatory Visit: Payer: Self-pay | Admitting: Orthopedic Surgery

## 2017-07-05 DIAGNOSIS — M5416 Radiculopathy, lumbar region: Secondary | ICD-10-CM

## 2017-07-15 ENCOUNTER — Ambulatory Visit
Admission: RE | Admit: 2017-07-15 | Discharge: 2017-07-15 | Disposition: A | Discharge status: Home / Self Care | Payer: Medicare Other | Source: Ambulatory Visit | Attending: Orthopedic Surgery | Admitting: Orthopedic Surgery

## 2017-07-15 DIAGNOSIS — M5416 Radiculopathy, lumbar region: Secondary | ICD-10-CM

## 2017-07-15 MED ORDER — METHYLPREDNISOLONE ACETATE 40 MG/ML INJ SUSP (RADIOLOG
120.0000 mg | Freq: Once | INTRAMUSCULAR | Status: AC
  Administered 2017-07-15: 120 mg via EPIDURAL

## 2017-07-15 MED ORDER — IOPAMIDOL (ISOVUE-M 200) INJECTION 41%
1.0000 mL | Freq: Once | INTRAMUSCULAR | Status: AC
  Administered 2017-07-15: 1 mL via EPIDURAL

## 2017-07-15 NOTE — Discharge Instructions (Signed)

## 2017-07-20 ENCOUNTER — Other Ambulatory Visit: Payer: Self-pay | Admitting: Internal Medicine

## 2017-09-14 ENCOUNTER — Ambulatory Visit: Payer: Medicare Other | Attending: Family Medicine | Admitting: Physical Therapy

## 2017-09-14 ENCOUNTER — Encounter: Payer: Self-pay | Admitting: Physical Therapy

## 2017-09-14 DIAGNOSIS — M5441 Lumbago with sciatica, right side: Secondary | ICD-10-CM | POA: Insufficient documentation

## 2017-09-14 DIAGNOSIS — M5416 Radiculopathy, lumbar region: Secondary | ICD-10-CM | POA: Diagnosis present

## 2017-09-14 DIAGNOSIS — M6281 Muscle weakness (generalized): Secondary | ICD-10-CM | POA: Diagnosis present

## 2017-09-14 NOTE — Therapy (Signed)
[facility] [address] [place], [place], [address] Phone: [phone]   Fax:  [phone]  Physical Therapy Evaluation  Patient Details  Name: Caitlin Wagner MRN: [id number] Date of Birth: June 03, 1966 Referring Provider: Dr. Anette Riedel   Encounter Date: 09/14/2017  PT End of Session - 09/14/17 1719    Visit Number  1    Date for PT Re-Evaluation  11/09/17    Authorization Type  [employer] Medicare G codes:  KX at visit 15    PT Start Time  1615    PT Stop Time  1705    PT Time Calculation (min)  50 min    Activity Tolerance  Patient tolerated treatment well       Past Medical History:  Diagnosis Date  . Arthritis    RA  . 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 Rheumatoid arthritis(714.0)     Past Surgical History:  Procedure Laterality Date  . feet reconstruction surgery     bilateral   . TONSILLECTOMY  age 50  . TOTAL KNEE ARTHROPLASTY Left 03/22/2017   Procedure: TOTAL KNEE ARTHROPLASTY w/ Navigation;  Surgeon: Samson Frederic, MD;  Location: [facility] OR;  Service: Orthopedics;  Laterality: Left;  . TOTAL KNEE ARTHROPLASTY Right 05/10/2017   Procedure: TOTAL KNEE ARTHROPLASTY;  Surgeon: Samson Frederic, MD;  Location: [facility] OR;  Service: Orthopedics;  Laterality: Right;    There were no vitals filed for this visit.   Subjective Assessment - 09/14/17 1620    Subjective  Had TKR over the summer.  Right buttock, lateral hip and groin pain started before knee surgeries.  No hip abnormalities but knees had no cartilage.  Sept 9th woke up with right LE felt like on fire.  Went to [facility] hospital.  X-rays negative.  Had steroid injection in groin in May helped.  In Sept had an ESI lumbar and S1 injection 2 weeks ago.  Helped for 2 days then  went back to the same.  Fell on steps while trying to carry things secondary to right LE give way.  Reports foot drags.  Walking pitched forward.      Pertinent History  RA;  TKR bil 03/22/17 and 05/13/17    Limitations  Sitting;Standing;House hold activities    How long can you sit comfortably?  always feel it immediately 15 min max    How long can you walk comfortably?  afraid to walk in neighborhood < 1/2 mile    Diagnostic tests  MRI hip and LB.  No hip abdnormalities.  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  Yes    Pain Score  4     Pain Location  Back    Pain Orientation  Right    Pain Type  Acute pain    Pain Radiating Towards  right LE posterior thigh, leg and bottom of foot    Pain Onset  More than a month ago    Pain Frequency  Constant    Aggravating Factors   stairs, sitting, standing in the shower, load dishwasher, cooking    Pain Relieving Factors  bending forward to stretch, make it pop  [facility] PT Assessment - 09/14/17 0001      Assessment   Medical Diagnosis  lumbar radiculopathy right    Referring Provider  Dr. Anette Riedel    Onset Date/Surgical Date  06/27/17    Next MD Visit  09/28/17    Prior Therapy  HHPT for TKR      Precautions   Precautions  None;Fall    Precaution Comments  no bending/twisting      Restrictions   Weight Bearing Restrictions  No      Balance Screen   Has the patient fallen in the past 6 months  Yes    How many times?  1    Has the patient had a decrease in activity level because of a fear of falling?   Yes    Is the patient reluctant to leave their home because of a fear of falling?   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; sit to stand from a chair    Leisure  used to walk 3 miles/day; going to movies; needlepoint but hard to sit now      Observation/Other Assessments   Focus on Therapeutic Outcomes (FOTO)   66% limitation      Posture/Postural Control   Posture/Postural Control  Postural limitations    Postural Limitations  Decreased lumbar lordosis    Posture Comments  ulnar drift in bil hands      ROM / Strength   AROM / PROM / Strength  -- possible extension preference      AROM   AROM Assessment Site  -- full knee extension and flexion bil    Lumbar Flexion  25 very painful    Lumbar Extension  20    Lumbar - Right Side Bend  30 painful    Lumbar - Left Side Bend  35      Strength   Strength Assessment Site  -- Left LE strength grossly 5/5    Right/Left Hip  Right    Right Hip Flexion  4/5 pain in low back    Right Hip ABduction  4/5    Right/Left Knee  Right    Right Knee Flexion  4/5    Right Knee Extension  4/5    Right/Left Ankle  Right    Right Ankle Dorsiflexion  4/5    Right Ankle Plantar Flexion  4/5    Right Ankle Inversion  4-/5    Right Ankle Eversion  4-/5    Lumbar Flexion  4/5    Lumbar Extension  4/5      Palpation   Palpation comment  tender points in right lumbar paraspinals, gluteals, piriformis, ITB      Slump test   Findings  Positive    Side  Right      Prone Knee Bend Test  Findings  Positive    Side  Right      Straight Leg Raise   Findings  Positive    Side   Right    Comment  +++cross SLR             Objective measurements completed on examination: See above findings.              PT Education - 09/14/17 1708    Education provided  Yes     Education Details  trial of prone lying over pillows, standing extension; sitting with lumbar roll    Person(s) Educated  Patient    Methods  Explanation;Demonstration;Handout    Comprehension  Returned demonstration       PT Short Term Goals - 09/14/17 1852      PT SHORT TERM GOAL #1   Title  The patient will demonstrate knowledge of basic self care strategies for centralization, pain relief, postural support    Time  4    Period  Weeks    Status  New    Target Date  10/12/17      PT SHORT TERM GOAL #2   Title  The patient will report a 30% improvement in peripheral right LE symptoms    Time  4    Period  Weeks    Status  New      PT SHORT TERM GOAL #3   Title  The patient will be able to sit for 15 min with minimal back, hip and leg pain    Time  4    Period  Weeks    Status  New      PT SHORT TERM GOAL #4   Title  The patient will be able to walk 1/4 mile for community ambulation    Time  4    Period  Weeks    Status  New        PT Long Term Goals - 09/14/17 1905      PT LONG TERM GOAL #1   Title  The patient will be independent with safe self progression of HEP and independence with self management techniques    Time  8    Period  Weeks    Status  New    Target Date  11/09/17      PT LONG TERM GOAL #2   Title  The patient will report a 60% improvement in right back, hip and LE symptoms with usual ADLs    Time  8    Period  Weeks    Status  New      PT LONG TERM GOAL #3   Title  The patient will have 4+/5 right LE strength needed for ascending/descending steps safely and for walking longer periods of time    Time  8    Period  Weeks    Status  New      PT LONG TERM GOAL #4   Title  Patient will be able to walk 1/2 mile with minimal increase in symptoms    Time  8    Period  Weeks    Status  New      PT LONG TERM GOAL #5   Title  FOTO functional outcome score improved from 66% limitation to 44% limitation    Time  8    Period  Weeks    Status   New             Plan - 09/14/17  1830    Clinical Impression Statement  The patient reports a several month history of right hip pain.  She had TKR surgeries bil over the summer with a good recuperations however on Sept 9th she awoke with severe right hip pain with numbness and tingling radiating down the posterior aspect of thigh, leg and the bottom of her foot.  She has had hip injections and an ESI with very short term relief only.  She has had x-ray and imaging of her right hip which were negative for findings.  An MRI of her lumbar spine showed multi-level bulging discs and stenosis right > left.  Her medical history is significant for rheumatoid arthritis.  Her symptoms are worsened with sitting, standing and her leg has given way on the stairs.  She is fearful of walking longer distances.   Limited lumbar ROM in all planes with flexion the most painful.  She has relief of distal symptoms with prone lying with 2 pillows under her chest.  She is unable to perform a typical press up secondary to RA joint pain.   Weakness with right ankle inversion and eversion.   +SLR and +cross SLR.  LBP with right slump test and prone knee bend.  Myofascial tender points right paraspinals, gluteals, piriformis, and ITB.  She would benefit from PT to address these deficits.      History and Personal Factors relevant to plan of care:  RA;  recent bil TKR    Clinical Presentation  Evolving    Clinical Presentation due to:  worsening of radicular symptoms, more peripheralized    Clinical Decision Making  Moderate    Rehab Potential  Good    Clinical Impairments Affecting Rehab Potential  RA      PT Frequency  2x / week    PT Duration  8 weeks    PT Treatment/Interventions  Cryotherapy;Electrical Stimulation;Ultrasound;Moist Heat;Iontophoresis 4mg /ml Dexamethasone;Therapeutic exercise;Therapeutic activities;Neuromuscular re-education;Patient/family education;Manual techniques;Dry needling;Taping;Traction    PT  Next Visit Plan  assess response to prone lying over pillows (no press ups secondary to UE jt pain) for centralization;  if no directional preference try LE decompressive exercises (no cervical retraction secondary to RA);  modalities for pain relief;  ionto if cert signed;  patient will consider DN for myofascial component    Consulted and Agree with Plan of Care  Patient       Patient will benefit from skilled therapeutic intervention in order to improve the following deficits and impairments:  Increased fascial restricitons, Pain, Postural dysfunction, Decreased activity tolerance, Decreased range of motion, Decreased strength, Difficulty walking  Visit Diagnosis: Acute right-sided low back pain with right-sided sciatica - Plan: PT plan of care cert/re-cert  Muscle weakness (generalized) - Plan: PT plan of care cert/re-cert  Radiculopathy, lumbar region - Plan: PT plan of care cert/re-cert  G-Codes - 09/14/17 1911    Functional Assessment Tool Used (Outpatient Only)  FOTO; clinical judgement    Functional Limitation  Mobility: Walking and moving around    Mobility: Walking and Moving Around Current Status ([id number])  At least 60 percent but less than 80 percent impaired, limited or restricted    Mobility: Walking and Moving Around Goal Status [phone]([id number])  At least 40 percent but less than 60 percent impaired, limited or restricted        Problem List Patient Active Problem List   Diagnosis Date Noted  . Degenerative arthritis of right knee 05/10/2017  . Degenerative joint disease of right knee 05/10/2017  .  Rheumatoid arthritis(714.0)    Lavinia Sharps, PT 09/14/17 7:21 PM Phone: [phone] Fax: [phone]  Vivien Presto 09/14/2017, 7:20 PM  [facility] [address] [place], [place], [address] Phone: [phone]   Fax:  [phone]  Name: CRYSTL LILLARD MRN: [id number] Date of Birth: 1966/06/06

## 2017-09-14 NOTE — Patient Instructions (Signed)
     Trial of extension:  Lying on stomach over pillows 2 min every 2 hours                                 Standing extensions  Limit sitting, use lumbar roll   Change position often  Short walks    Lavinia Sharps PT [facility] [address], [address] [place], [place] [address] Phone # [phone] Fax [phone]

## 2017-09-20 ENCOUNTER — Ambulatory Visit: Payer: Medicare Other | Attending: Family Medicine | Admitting: Physical Therapy

## 2017-09-20 DIAGNOSIS — M5416 Radiculopathy, lumbar region: Secondary | ICD-10-CM | POA: Insufficient documentation

## 2017-09-20 DIAGNOSIS — M5441 Lumbago with sciatica, right side: Secondary | ICD-10-CM | POA: Diagnosis present

## 2017-09-20 DIAGNOSIS — M6281 Muscle weakness (generalized): Secondary | ICD-10-CM | POA: Diagnosis present

## 2017-09-20 NOTE — Therapy (Signed)
[facility] [address], [address] [place], [place], [address] Phone: [phone]   Fax:  [phone]  Physical Therapy Treatment  Patient Details  Name: Caitlin Wagner MRN: [id number] Date of Birth: [birth year] Referring Provider: Dr. Anette Riedel   Encounter Date: 09/20/2017  PT End of Session - 09/20/17 1604    Visit Number  2    Date for PT Re-Evaluation  11/09/17    Authorization Type  [employer]HC Medicare G codes:  KX at visit 15    PT Start Time  1534    PT Stop Time  1615    PT Time Calculation (min)  41 min    Activity Tolerance  Patient tolerated treatment well;No increased pain       Past Medical History:  Diagnosis Date  . Arthritis    RA  . 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 Rheumatoid arthritis(714.0)     Past Surgical History:  Procedure Laterality Date  . feet reconstruction surgery     bilateral   . TONSILLECTOMY  age 78  . TOTAL KNEE ARTHROPLASTY Left 03/22/2017   Procedure: TOTAL KNEE ARTHROPLASTY w/ Navigation;  Surgeon: Samson Frederic, MD;  Location: [facility] OR;  Service: Orthopedics;  Laterality: Left;  . TOTAL KNEE ARTHROPLASTY Right 05/10/2017   Procedure: TOTAL KNEE ARTHROPLASTY;  Surgeon: Samson Frederic, MD;  Location: [facility] OR;  Service: Orthopedics;  Laterality: Right;    There were no vitals filed for this visit.  Subjective Assessment - 09/20/17 1536    Subjective  Pt reports that in the morning she will still have some tingling, etc. but the rest of the day is much better. She will have 2 or so hours without any symptoms. She reports that she has been completing her exercises at home as instructed (sometimes 2x/day). She reports 75% improvement overall with activity. She was able to sit for an hour at a concert  with her son.     Pertinent History  RA;  TKR bil 03/22/17 and 05/13/17    Limitations  Sitting;Standing;House hold activities    How long can you sit comfortably?  1 hour at a concert this past week     How long can you walk comfortably?  afraid to walk in neighborhood < 1/2 mile    Diagnostic tests  MRI hip and LB.  No hip abdnormalities.  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  No/denies    Pain Onset  More than a month ago                      [facility] Adult PT Treatment/Exercise - 09/20/17 0001      Therapeutic Activites    Therapeutic Activities  Other Therapeutic Activities    Other Therapeutic Activities  log roll supine to/from sitting x2 reps       Exercises   Exercises  Lumbar      Lumbar Exercises: Stretches   Hip Flexor Stretch  3 reps;30 seconds;Other (comment) each LE       Lumbar Exercises: Standing   Other Standing Lumbar Exercises  repeated extension with support at hips x10 reps       Lumbar Exercises:  Supine   Clam  10 reps    Clam Limitations  x2 sets each with abdominal brace and red TB     Other Supine Lumbar Exercises  straight leg hip extension into table with 3 sec hold and glute squeeze x3 reps on the Rt ended due to pt report of increased RLE numbness/tingling      Manual Therapy   Manual Therapy  Myofascial release    Myofascial Release  IASTM Rt quadriceps/ TPR Rt iliacus              PT Education - 09/20/17 1602    Education provided  Yes    Education Details  discussed progressions to standing extension with trial of 10 reps 2x a day to see how symptoms respond; lifting mechanics reviewed and therapist demonstrated alternative technique with split stance    Person(s) Educated  Patient    Methods  Explanation;Demonstration;Handout    Comprehension  Verbalized understanding;Returned demonstration       PT Short Term Goals - 09/14/17 1852      PT  SHORT TERM GOAL #1   Title  The patient will demonstrate knowledge of basic self care strategies for centralization, pain relief, postural support    Time  4    Period  Weeks    Status  New    Target Date  10/12/17      PT SHORT TERM GOAL #2   Title  The patient will report a 30% improvement in peripheral right LE symptoms    Time  4    Period  Weeks    Status  New      PT SHORT TERM GOAL #3   Title  The patient will be able to sit for 15 min with minimal back, hip and leg pain    Time  4    Period  Weeks    Status  New      PT SHORT TERM GOAL #4   Title  The patient will be able to walk 1/4 mile for community ambulation    Time  4    Period  Weeks    Status  New        PT Long Term Goals - 09/14/17 1905      PT LONG TERM GOAL #1   Title  The patient will be independent with safe self progression of HEP and independence with self management techniques    Time  8    Period  Weeks    Status  New    Target Date  11/09/17      PT LONG TERM GOAL #2   Title  The patient will report a 60% improvement in right back, hip and LE symptoms with usual ADLs    Time  8    Period  Weeks    Status  New      PT LONG TERM GOAL #3   Title  The patient will have 4+/5 right LE strength needed for ascending/descending steps safely and for walking longer periods of time    Time  8    Period  Weeks    Status  New      PT LONG TERM GOAL #4   Title  Patient will be able to walk 1/2 mile with minimal increase in symptoms    Time  8    Period  Weeks    Status  New      PT LONG TERM GOAL #5   Title  FOTO functional outcome score improved from 66% limitation to 44% limitation    Time  8    Period  Weeks    Status  New            Plan - 09/20/17 1655    Clinical Impression Statement  Pt arrived today with reports of atleast 75% improvement in her symptoms from her last session. She has been consistently completing her HEP without any difficulty. Therapist was able to address  concerns with lifting mechanics and proper log roll technique. Also completed gentle therex to improve hip flexibility, especially on the Rt. Pt does demonstrate restrictions along the Rt anterior hip with palpable trigger points in the quadriceps and illacus, noted during ambulation. Ended session without increase in pain/symptoms and pt verbalizing good understanding of all exercises/HEP additions made.    Rehab Potential  Good    Clinical Impairments Affecting Rehab Potential  RA      PT Frequency  2x / week    PT Duration  8 weeks    PT Treatment/Interventions  Cryotherapy;Electrical Stimulation;Ultrasound;Moist Heat;Iontophoresis 4mg /ml Dexamethasone;Therapeutic exercise;Therapeutic activities;Neuromuscular re-education;Patient/family education;Manual techniques;Dry needling;Taping;Traction    PT Next Visit Plan  assess pt response to standing repeated extension (likely directional preference for extension due to centralization with initial HEP); Rt hip mobility/soft tissue; modalities for pain relief;  ionto if cert signed;  patient will consider DN for myofascial component    Consulted and Agree with Plan of Care  Patient       Patient will benefit from skilled therapeutic intervention in order to improve the following deficits and impairments:  Increased fascial restricitons, Pain, Postural dysfunction, Decreased activity tolerance, Decreased range of motion, Decreased strength, Difficulty walking  Visit Diagnosis: Acute right-sided low back pain with right-sided sciatica  Muscle weakness (generalized)  Radiculopathy, lumbar region     Problem List Patient Active Problem List   Diagnosis Date Noted  . Degenerative arthritis of right knee 05/10/2017  . Degenerative joint disease of right knee 05/10/2017  . Rheumatoid arthritis(714.0)    5:02 PM,09/20/17 Marylyn IshiharaSara Kiser PT, DPT [facility]  [phone]  [facility] [address] [place], [place], [address] Phone: [phone]   Fax:  [phone]  Name: Caitlin Wagner MRN: [id number] Date of Birth: 03/10/1966

## 2017-09-20 NOTE — Patient Instructions (Signed)
   HIP FLEXOR STRETCH 2  While lying on a table or high bed, let the affected leg lower towards the floor until a stretch is felt along the front of your thigh.   At the same time, grasp your opposite knee and pull it towards your chest.     [facility] [address], [address] [place], [place] [address] Phone # [phone] Fax [phone]

## 2017-09-22 ENCOUNTER — Ambulatory Visit: Payer: Medicare Other

## 2017-09-22 DIAGNOSIS — M5441 Lumbago with sciatica, right side: Secondary | ICD-10-CM

## 2017-09-22 DIAGNOSIS — M6281 Muscle weakness (generalized): Secondary | ICD-10-CM

## 2017-09-22 DIAGNOSIS — M5416 Radiculopathy, lumbar region: Secondary | ICD-10-CM

## 2017-09-22 NOTE — Therapy (Signed)
[facility] [address] [place], [place], [address] Phone: [phone]   Fax:  [phone]  Physical Therapy Treatment  Patient Details  Name: Caitlin Wagner MRN: [id number] Date of Birth: 03/16/1966 Referring Provider: Dr. Anette Riedel   Encounter Date: 09/22/2017  PT End of Session - 09/22/17 1527    Visit Number  3    Date for PT Re-Evaluation  11/09/17    Authorization Type  [employer]HC Medicare G codes:  KX at visit 15    PT Start Time  1445 dry needling    PT Stop Time  1535    PT Time Calculation (min)  50 min    Activity Tolerance  Patient tolerated treatment well;No increased pain    Behavior During Therapy  WFL for tasks assessed/performed       Past Medical History:  Diagnosis Date  . Arthritis    RA  . 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 Rheumatoid arthritis(714.0)     Past Surgical History:  Procedure Laterality Date  . feet reconstruction surgery     bilateral   . TONSILLECTOMY  age 31  . TOTAL KNEE ARTHROPLASTY Left 03/22/2017   Procedure: TOTAL KNEE ARTHROPLASTY w/ Navigation;  Surgeon: Samson Frederic, MD;  Location: [facility];  Service: Orthopedics;  Laterality: Left;  . TOTAL KNEE ARTHROPLASTY Right 05/10/2017   Procedure: TOTAL KNEE ARTHROPLASTY;  Surgeon: Samson Frederic, MD;  Location: [facility];  Service: Orthopedics;  Laterality: Right;    There were no vitals filed for this visit.  Subjective Assessment - 09/22/17 1449    Subjective  Rt LE pain is 75% improved.  I still have LBP.      Currently in Pain?  Yes    Pain Score  3     Pain Orientation  Right    Pain Descriptors / Indicators  Aching    Pain Type  Acute pain    Pain Onset  More than a month ago    Pain Frequency  Constant    Aggravating  Factors   bending, standing, sitting too long    Pain Relieving Factors  change of position, stretching                      [facility] Adult PT Treatment/Exercise - 09/22/17 0001      Exercises   Exercises  Knee/Hip      Lumbar Exercises: Stretches   Hip Flexor Stretch  3 reps;30 seconds;Other (comment) each LE       Lumbar Exercises: Prone   Single Arm Raise  -- abdominal bracing and pillow under abdomen    Straight Leg Raise  20 reps abdominal bracing and pillow under abdomen      Knee/Hip Exercises: Stretches   Other Knee/Hip Stretches  butterfly stretch 3x20 seconds      Modalities   Modalities  Moist Heat      Moist Heat Therapy   Number Minutes Moist Heat  10 Minutes    Moist Heat Location  Lumbar Spine in prone      Manual Therapy   Manual Therapy  Soft tissue mobilization;Myofascial release    Manual therapy comments  soft tissue to Rt lumbar paraspinals and gluteals        Trigger Point Dry Needling -  09/22/17 1454    Consent Given?  Yes    Education Handout Provided  Yes    Muscles Treated Lower Body  Gluteus minimus;Gluteus maximus Rt gluteals, bil lumbar multifidi    Gluteus Maximus Response  Twitch response elicited;Palpable increased muscle length    Gluteus Minimus Response  Twitch response elicited;Palpable increased muscle length           PT Education - 09/22/17 1526    Education provided  Yes    Education Details  DN info    Person(s) Educated  Patient    Methods  Explanation;Demonstration;Handout    Comprehension  Verbalized understanding;Returned demonstration       PT Short Term Goals - 09/14/17 1852      PT SHORT TERM GOAL #1   Title  The patient will demonstrate knowledge of basic self care strategies for centralization, pain relief, postural support    Time  4    Period  Weeks    Status  New    Target Date  10/12/17      PT SHORT TERM GOAL #2   Title  The patient will report a 30% improvement in peripheral right LE  symptoms    Time  4    Period  Weeks    Status  New      PT SHORT TERM GOAL #3   Title  The patient will be able to sit for 15 min with minimal back, hip and leg pain    Time  4    Period  Weeks    Status  New      PT SHORT TERM GOAL #4   Title  The patient will be able to walk 1/4 mile for community ambulation    Time  4    Period  Weeks    Status  New        PT Long Term Goals - 09/14/17 1905      PT LONG TERM GOAL #1   Title  The patient will be independent with safe self progression of HEP and independence with self management techniques    Time  8    Period  Weeks    Status  New    Target Date  11/09/17      PT LONG TERM GOAL #2   Title  The patient will report a 60% improvement in right back, hip and LE symptoms with usual ADLs    Time  8    Period  Weeks    Status  New      PT LONG TERM GOAL #3   Title  The patient will have 4+/5 right LE strength needed for ascending/descending steps safely and for walking longer periods of time    Time  8    Period  Weeks    Status  New      PT LONG TERM GOAL #4   Title  Patient will be able to walk 1/2 mile with minimal increase in symptoms    Time  8    Period  Weeks    Status  New      PT LONG TERM GOAL #5   Title  FOTO functional outcome score improved from 66% limitation to 44% limitation    Time  8    Period  Weeks    Status  New            Plan - 09/22/17 1501    Clinical Impression Statement  Pt reports 75%  reduction in Rt LE pain since the start of care.  Pt with continued lumbar pain.  Pt with trigger points and tension in Rt lumbar paraspinals and gluteals and demonstrated improved tissue mobility and reduced pain after dry needling. Pt will continue to benefit from skilled PT for core strength, extension based exercise and manual/needling.    Rehab Potential  Good    Clinical Impairments Affecting Rehab Potential  RA      PT Frequency  2x / week    PT Duration  8 weeks    PT  Treatment/Interventions  Cryotherapy;Electrical Stimulation;Ultrasound;Moist Heat;Iontophoresis 4mg /ml Dexamethasone;Therapeutic exercise;Therapeutic activities;Neuromuscular re-education;Patient/family education;Manual techniques;Dry needling;Taping;Traction    PT Next Visit Plan  assess response to dry needling.  Core strength, hip flexibility, extension based exercise.   Pt wants to try lumbar traction.      Consulted and Agree with Plan of Care  Patient       Patient will benefit from skilled therapeutic intervention in order to improve the following deficits and impairments:  Increased fascial restricitons, Pain, Postural dysfunction, Decreased activity tolerance, Decreased range of motion, Decreased strength, Difficulty walking  Visit Diagnosis: Acute right-sided low back pain with right-sided sciatica  Muscle weakness (generalized)  Radiculopathy, lumbar region     Problem List Patient Active Problem List   Diagnosis Date Noted  . Degenerative arthritis of right knee 05/10/2017  . Degenerative joint disease of right knee 05/10/2017  . Rheumatoid arthritis(714.0)      Lorrene Reid, PT 09/22/17 3:31 PM  [facility] [address] [place], [place], [address] Phone: [phone]   Fax:  [phone]  Name: CHAZ LESTER MRN: [id number] Date of Birth: 1966/03/12

## 2017-09-22 NOTE — Patient Instructions (Addendum)

## 2017-09-27 ENCOUNTER — Encounter: Payer: Medicare Other | Admitting: Physical Therapy

## 2017-09-29 ENCOUNTER — Ambulatory Visit: Payer: Medicare Other

## 2017-09-29 DIAGNOSIS — M5416 Radiculopathy, lumbar region: Secondary | ICD-10-CM

## 2017-09-29 DIAGNOSIS — M5441 Lumbago with sciatica, right side: Secondary | ICD-10-CM | POA: Diagnosis not present

## 2017-09-29 DIAGNOSIS — M6281 Muscle weakness (generalized): Secondary | ICD-10-CM

## 2017-09-29 NOTE — Therapy (Signed)
[facility] [address] [place], [place], [address] Phone: [phone]   Fax:  [phone]  Physical Therapy Treatment  Patient Details  Name: Caitlin Wagner MRN: [id number] Date of Birth: 1966/04/12 Referring Provider: Dr. Anette Riedel   Encounter Date: 09/29/2017  PT End of Session - 09/29/17 1616    Visit Number  4    Date for PT Re-Evaluation  11/09/17    Authorization Type  [employer]HC Medicare G codes:  KX at visit 15    PT Start Time  1530    PT Stop Time  1625    PT Time Calculation (min)  55 min    Activity Tolerance  Patient tolerated treatment well    Behavior During Therapy  [facility] for tasks assessed/performed       Past Medical History:  Diagnosis Date  . Arthritis    RA  . 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 Rheumatoid arthritis(714.0)     Past Surgical History:  Procedure Laterality Date  . feet reconstruction surgery     bilateral   . TONSILLECTOMY  age 7  . TOTAL KNEE ARTHROPLASTY Left 03/22/2017   Procedure: TOTAL KNEE ARTHROPLASTY w/ Navigation;  Surgeon: Samson Frederic, MD;  [facility]: [facility];  Service: Orthopedics;  Laterality: Left;  . TOTAL KNEE ARTHROPLASTY Right 05/10/2017   Procedure: TOTAL KNEE ARTHROPLASTY;  Surgeon: Samson Frederic, MD;  [facility]: [facility];  Service: Orthopedics;  Laterality: Right;    There were no vitals filed for this visit.  Subjective Assessment - 09/29/17 1531    Subjective  I felt really good after the dry needling.      Pertinent History  RA;  TKR bil 03/22/17 and 05/13/17    Diagnostic tests  MRI hip and LB.  No hip abdnormalities.  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  Yes    Pain Score   0-No pain    Pain Location  Back    Pain Orientation  Right    Pain Descriptors / Indicators  Aching    Pain Type  Acute pain    Pain Onset  More than a month ago    Pain Frequency  Constant    Aggravating Factors   bending, standing, sitting too long    Pain Relieving Factors  change of position, stretching                      [facility] Adult PT Treatment/Exercise - 09/29/17 0001      Lumbar Exercises: Stretches   Hip Flexor Stretch  3 reps;30 seconds;Other (comment) each LE       Lumbar Exercises: Prone   Straight Leg Raise  20 reps abdominal bracing and pillow under abdomen      Knee/Hip Exercises: Stretches   Other Knee/Hip Stretches  butterfly stretch 3x20 seconds      Modalities   Modalities  Moist Heat      Moist Heat Therapy   Number Minutes Moist Heat  10 Minutes    Moist Heat Location  Lumbar Spine in prone      Manual Therapy   Manual Therapy  Soft tissue mobilization;Myofascial release  Manual therapy comments  soft tissue to Rt lumbar paraspinals and gluteals        Trigger Point Dry Needling - 09/29/17 1546    Consent Given?  Yes    Education Handout Provided  Yes    Muscles Treated Lower Body  Gluteus minimus;Gluteus maximus bil lumbar multifidi, Rt gluteals    Gluteus Maximus Response  Twitch response elicited;Palpable increased muscle length    Gluteus Minimus Response  Twitch response elicited;Palpable increased muscle length             PT Short Term Goals - 09/29/17 1534      PT SHORT TERM GOAL #1   Title  The patient will demonstrate knowledge of basic self care strategies for centralization, pain relief, postural support    Status  Achieved      PT SHORT TERM GOAL #2   Title  The patient will report a 30% improvement in peripheral right LE symptoms    Status  Achieved      PT SHORT TERM GOAL #3   Title  The patient will be able to sit for 15 min with minimal back, hip and leg pain    Baseline  10 minutes    Time  4     Period  Weeks    Status  On-going      PT SHORT TERM GOAL #4   Title  The patient will be able to walk 1/4 mile for community ambulation    Baseline  shopping x 1.5 hours without pain    Status  Achieved        PT Long Term Goals - 09/29/17 1536      PT LONG TERM GOAL #1   Title  The patient will be independent with safe self progression of HEP and independence with self management techniques    Time  8    Period  Weeks    Status  On-going      PT LONG TERM GOAL #2   Title  The patient will report a 60% improvement in right back, hip and LE symptoms with usual ADLs    Time  8    Period  Weeks    Status  On-going      PT LONG TERM GOAL #4   Title  Patient will be able to walk 1/2 mile with minimal increase in symptoms    Status  Achieved            Plan - 09/29/17 1538    Clinical Impression Statement  Pt reports no pain in back or leg pain.  Pt had improved mobility and reduced pain after dry needling last session.  Pt with improved tension today with dry needling in the Rt gluteals and lumbar paraspinals.  Pt is able to walk in the community without increased pain for 1 hour.  Pt with significant RA and limited strength and mobility due to this condition.  Pt will continue to benefit from skilled PT for core strength, hip strength and flexibility.      Rehab Potential  Good    PT Frequency  2x / week    PT Duration  8 weeks    PT Treatment/Interventions  Cryotherapy;Electrical Stimulation;Ultrasound;Moist Heat;Iontophoresis 4mg /ml Dexamethasone;Therapeutic exercise;Therapeutic activities;Neuromuscular re-education;Patient/family education;Manual techniques;Dry needling;Taping;Traction    PT Next Visit Plan  assess response to dry needling.  Core strength, hip flexibility, extension based exercise.       Recommended Other Services  initial certification is signed  Consulted and Agree with Plan of Care  Patient       Patient will benefit from skilled therapeutic  intervention in order to improve the following deficits and impairments:  Increased fascial restricitons, Pain, Postural dysfunction, Decreased activity tolerance, Decreased range of motion, Decreased strength, Difficulty walking  Visit Diagnosis: Acute right-sided low back pain with right-sided sciatica  Muscle weakness (generalized)  Radiculopathy, lumbar region     Problem List Patient Active Problem List   Diagnosis Date Noted  . Degenerative arthritis of right knee 05/10/2017  . Degenerative joint disease of right knee 05/10/2017  . Rheumatoid arthritis(714.0)      Lorrene Reid, PT 09/29/17 4:19 PM  [facility] [address] [place], [place], [address] Phone: [phone]   Fax:  [phone]  Name: Caitlin Wagner MRN: [id number] Date of Birth: 1966/03/20

## 2017-09-30 ENCOUNTER — Ambulatory Visit: Payer: Medicare Other | Admitting: Physical Therapy

## 2017-09-30 DIAGNOSIS — M5441 Lumbago with sciatica, right side: Secondary | ICD-10-CM

## 2017-09-30 DIAGNOSIS — M5416 Radiculopathy, lumbar region: Secondary | ICD-10-CM

## 2017-09-30 DIAGNOSIS — M6281 Muscle weakness (generalized): Secondary | ICD-10-CM

## 2017-09-30 NOTE — Therapy (Signed)
[facility] [address] [place], [place], [address] Phone: [phone]   Fax:  [phone]  Physical Therapy Treatment  Patient Details  Name: Caitlin Wagner MRN: [id number] Date of Birth: 04-07-1966 Referring Provider: Dr. Anette Riedel   Encounter Date: 09/30/2017  PT End of Session - 09/30/17 1043    Visit Number  5    Date for PT Re-Evaluation  11/09/17    Authorization Type  [employer]HC Medicare G codes:  KX at visit 15    PT Start Time  1021    PT Stop Time  1100    PT Time Calculation (min)  39 min    Activity Tolerance  Patient tolerated treatment well    Behavior During Therapy  [facility] for tasks assessed/performed       Past Medical History:  Diagnosis Date  . Arthritis    RA  . 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 Rheumatoid arthritis(714.0)     Past Surgical History:  Procedure Laterality Date  . feet reconstruction surgery     bilateral   . TONSILLECTOMY  age 74  . TOTAL KNEE ARTHROPLASTY Left 03/22/2017   Procedure: TOTAL KNEE ARTHROPLASTY w/ Navigation;  Surgeon: Samson Frederic, MD;  [facility];  Service: Orthopedics;  Laterality: Left;  . TOTAL KNEE ARTHROPLASTY Right 05/10/2017   Procedure: TOTAL KNEE ARTHROPLASTY;  Surgeon: Samson Frederic, MD;  [facility]: [facility];  Service: Orthopedics;  Laterality: Right;    There were no vitals filed for this visit.  Subjective Assessment - 09/30/17 1024    Subjective  I have not had any pain today yet.      Pertinent History  RA;  TKR bil 03/22/17 and 05/13/17    Limitations  Sitting;Standing;House hold activities    How long can you sit comfortably?  1 hour at a concert this past week     How long can you walk comfortably?  afraid to walk in neighborhood < 1/2 mile     Diagnostic tests  MRI hip and LB.  No hip abdnormalities.  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  No/denies                      [facility] Adult PT Treatment/Exercise - 09/30/17 0001      Lumbar Exercises: Stretches   Piriformis Stretch  3 reps;30 seconds      Lumbar Exercises: Seated   Sit to Stand  20 reps 10x with red band      Lumbar Exercises: Supine   Ab Set  10 reps;3 seconds    Bridge  10 reps;3 seconds red band    Large Ball Abdominal Isometric  20 reps roll fwd/back             PT Education - 09/30/17 1101    Education provided  Yes    Education Details  piriformis stretch    Person(s) Educated  Patient    Methods  Explanation;Demonstration;Handout    Comprehension  Verbalized understanding;Returned demonstration       PT Short Term Goals - 09/29/17 1534      PT SHORT TERM GOAL #1   Title  The  patient will demonstrate knowledge of basic self care strategies for centralization, pain relief, postural support    Status  Achieved      PT SHORT TERM GOAL #2   Title  The patient will report a 30% improvement in peripheral right LE symptoms    Status  Achieved      PT SHORT TERM GOAL #3   Title  The patient will be able to sit for 15 min with minimal back, hip and leg pain    Baseline  10 minutes    Time  4    Period  Weeks    Status  On-going      PT SHORT TERM GOAL #4   Title  The patient will be able to walk 1/4 mile for community ambulation    Baseline  shopping x 1.5 hours without pain    Status  Achieved        PT Long Term Goals - 09/29/17 1536      PT LONG TERM GOAL #1   Title  The patient will be independent with safe self progression of HEP and independence with self management techniques    Time  8    Period  Weeks    Status  On-going      PT LONG TERM GOAL #2   Title  The patient will report a 60% improvement in right back, hip and LE  symptoms with usual ADLs    Time  8    Period  Weeks    Status  On-going      PT LONG TERM GOAL #4   Title  Patient will be able to walk 1/2 mile with minimal increase in symptoms    Status  Achieved            Plan - 09/30/17 1045    Clinical Impression Statement  Pt has no back or leg pain today.  She was monitored for pain throughout session and no increased pain.  Pt will benefit from skilled therapy to work on core strength during functional movements.    Rehab Potential  Good    Clinical Impairments Affecting Rehab Potential  RA      PT Treatment/Interventions  Cryotherapy;Electrical Stimulation;Ultrasound;Moist Heat;Iontophoresis 4mg /ml Dexamethasone;Therapeutic exercise;Therapeutic activities;Neuromuscular re-education;Patient/family education;Manual techniques;Dry needling;Taping;Traction    PT Next Visit Plan  core and hip strength and flexibility, extension exercises, dry needle Rt glutes and lumbar parapsinals as needed    Consulted and Agree with Plan of Care  Patient       Patient will benefit from skilled therapeutic intervention in order to improve the following deficits and impairments:  Increased fascial restricitons, Pain, Postural dysfunction, Decreased activity tolerance, Decreased range of motion, Decreased strength, Difficulty walking  Visit Diagnosis: Acute right-sided low back pain with right-sided sciatica  Muscle weakness (generalized)  Radiculopathy, lumbar region     Problem List Patient Active Problem List   Diagnosis Date Noted  . Degenerative arthritis of right knee 05/10/2017  . Degenerative joint disease of right knee 05/10/2017  . Rheumatoid arthritis(714.0)     05/12/2017, PT 09/30/2017, 11:02 AM  [facility] [address] [place], [place], [place] Phone: [phone]   Fax:  [phone]  Name: Caitlin Wagner MRN: [id number] Pique Date of Birth: 01/30/1966

## 2017-09-30 NOTE — Patient Instructions (Signed)
   PIRIFORMIS STRETCH  While lying on your back with both knee bent, cross your affected leg on the other knee.   Next, hold your unaffected thigh and pull it up towards your chest until a stretch is felt in the buttock.  30 sec hold x 3   [facility] [address], [address] [place], [place] [address] Phone # [phone] Fax [phone]

## 2017-10-04 ENCOUNTER — Ambulatory Visit: Payer: Medicare Other

## 2017-10-04 DIAGNOSIS — M5441 Lumbago with sciatica, right side: Secondary | ICD-10-CM | POA: Diagnosis not present

## 2017-10-04 DIAGNOSIS — M6281 Muscle weakness (generalized): Secondary | ICD-10-CM

## 2017-10-04 DIAGNOSIS — M5416 Radiculopathy, lumbar region: Secondary | ICD-10-CM

## 2017-10-04 NOTE — Therapy (Signed)
[facility] [address] [place], [place], [address] Phone: [phone]   Fax:  [phone]  Physical Therapy Treatment  Patient Details  Name: Caitlin Wagner MRN: [id number] Date of Birth: Oct 17, 1966 Referring Provider: Dr. Anette Riedel   Encounter Date: 10/04/2017  PT End of Session - 10/04/17 1527    Visit Number  6    Date for PT Re-Evaluation  11/09/17    Authorization Type  UHC Medicare G codes:  KX at visit 15    PT Start Time  1450    PT Stop Time  1529    PT Time Calculation (min)  39 min    Activity Tolerance  Patient tolerated treatment well    Behavior During Therapy  [facility] for tasks assessed/performed       Past Medical History:  Diagnosis Date  . Arthritis    RA  . 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 Rheumatoid arthritis(714.0)     Past Surgical History:  Procedure Laterality Date  . feet reconstruction surgery     bilateral   . TONSILLECTOMY  age 33  . TOTAL KNEE ARTHROPLASTY Left 03/22/2017   Procedure: TOTAL KNEE ARTHROPLASTY w/ Navigation;  Surgeon: Samson Frederic, MD;  Location: [facility];  Service: Orthopedics;  Laterality: Left;  . TOTAL KNEE ARTHROPLASTY Right 05/10/2017   Procedure: TOTAL KNEE ARTHROPLASTY;  Surgeon: Samson Frederic, MD;  Location: [facility] OR;  Service: Orthopedics;  Laterality: Right;    There were no vitals filed for this visit.  Subjective Assessment - 10/04/17 1451    Subjective  I was able to do more at home because I am feeling better. I feel 90% better since the start of care.      Currently in Pain?  No/denies                      [facility] Adult PT Treatment/Exercise - 10/04/17 0001      Lumbar Exercises: Stretches   Hip Flexor Stretch  3 reps;30 seconds;Other  (comment) each LE     Piriformis Stretch  3 reps;30 seconds seated and supine       Lumbar Exercises: Seated   Sit to Stand  20 reps 10x with red band      Lumbar Exercises: Supine   Ab Set  10 reps;3 seconds    Bridge  10 reps;3 seconds red band    Large Ball Abdominal Isometric  20 reps roll fwd/back      Lumbar Exercises: Prone   Straight Leg Raise  20 reps abdominal bracing and pillow under abdomen      Knee/Hip Exercises: Stretches   Other Knee/Hip Stretches  butterfly stretch 3x20 seconds      Knee/Hip Exercises: Aerobic   Nustep  Level 2x 8 minutes PT present to discuss progress               PT Short Term Goals - 10/04/17 1457      PT SHORT TERM GOAL #2   Title  The patient will report a 30% improvement in peripheral right LE symptoms    Status  Achieved      PT SHORT TERM GOAL #3   Title  The patient will be able to sit for 15 min with minimal back, hip  and leg pain    Time  4    Period  Weeks    Status  On-going        PT Long Term Goals - 10/04/17 1458      PT LONG TERM GOAL #1   Title  The patient will be independent with safe self progression of HEP and independence with self management techniques    Time  8    Period  Weeks    Status  On-going      PT LONG TERM GOAL #2   Title  The patient will report a 60% improvement in right back, hip and LE symptoms with usual ADLs    Baseline  90% improvement    Status  Achieved            Plan - 10/04/17 1500    Clinical Impression Statement  Pt reports 90% overall improvement in LE and back symptoms since the start of care.  Session today focused on hip and core strength and LE flexibility.  Pt requires verbal cues for technique with eccentric muscle activity.  Pt will continue to benefit from skilled PT for core and hip strength, flexibility and extension based activity if LE radiculopathy returns.      Rehab Potential  Good    PT Frequency  2x / week    PT Duration  8 weeks    PT  Treatment/Interventions  Cryotherapy;Electrical Stimulation;Ultrasound;Moist Heat;Iontophoresis 4mg /ml Dexamethasone;Therapeutic exercise;Therapeutic activities;Neuromuscular re-education;Patient/family education;Manual techniques;Dry needling;Taping;Traction    PT Next Visit Plan  core and hip strength and flexibility, extension exercises, dry needle Rt glutes and lumbar parapsinals as needed    Consulted and Agree with Plan of Care  Patient       Patient will benefit from skilled therapeutic intervention in order to improve the following deficits and impairments:  Increased fascial restricitons, Pain, Postural dysfunction, Decreased activity tolerance, Decreased range of motion, Decreased strength, Difficulty walking  Visit Diagnosis: Acute right-sided low back pain with right-sided sciatica  Muscle weakness (generalized)  Radiculopathy, lumbar region     Problem List Patient Active Problem List   Diagnosis Date Noted  . Degenerative arthritis of right knee 05/10/2017  . Degenerative joint disease of right knee 05/10/2017  . Rheumatoid arthritis(714.0)     05/12/2017, PT 10/04/17 3:31 PM  [facility] [address] [place], [place], [place] Phone: [phone]   Fax:  [phone]  Name: Caitlin Wagner MRN: Davy Pique Date of Birth: [birth year]

## 2017-10-06 ENCOUNTER — Ambulatory Visit: Payer: Medicare Other | Admitting: Physical Therapy

## 2017-10-06 DIAGNOSIS — M5416 Radiculopathy, lumbar region: Secondary | ICD-10-CM

## 2017-10-06 DIAGNOSIS — M6281 Muscle weakness (generalized): Secondary | ICD-10-CM

## 2017-10-06 DIAGNOSIS — M5441 Lumbago with sciatica, right side: Secondary | ICD-10-CM

## 2017-10-06 NOTE — Therapy (Addendum)
[facility] [address], [place], [place], [address] Phone: [phone]   Fax:  [phone]  Physical Therapy Treatment  Patient Details  Name: Caitlin Wagner MRN: [id number] Date of Birth: 1966/01/17 Referring Provider: Dr. Ronette Deter   Encounter Date: 10/06/2017  PT End of Session - 10/06/17 1525    Visit Number  7    Date for PT Re-Evaluation  11/09/17    Authorization Type  UHC Medicare G codes:  KX at visit 15    PT Start Time  1447    PT Stop Time  1525    PT Time Calculation (min)  38 min    Activity Tolerance  Patient tolerated treatment well;No increased pain    Behavior During Therapy  WFL for tasks assessed/performed       Past Medical History:  Diagnosis Date  . Arthritis    RA  . 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 Rheumatoid arthritis(714.0)     Past Surgical History:  Procedure Laterality Date  . feet reconstruction surgery     bilateral   . TONSILLECTOMY  age 89  . TOTAL KNEE ARTHROPLASTY Left 03/22/2017   Procedure: TOTAL KNEE ARTHROPLASTY w/ Navigation;  Surgeon: Rod Can, MD;  Location: [place];  Service: Orthopedics;  Laterality: Left;  . TOTAL KNEE ARTHROPLASTY Right 05/10/2017   Procedure: TOTAL KNEE ARTHROPLASTY;  Surgeon: Rod Can, MD;  Location: [place];  Service: Orthopedics;  Laterality: Right;    There were no vitals filed for this visit.  Subjective Assessment - 10/06/17 1451    Subjective  Pt reports that things are going great. She has no back pain, but does note upper thoracic pain between the shoulder blades which started today.     Currently in Pain?  Yes    Pain Score  8     Pain Location  Thoracic    Pain Orientation  Mid;Upper    Pain Descriptors / Indicators   Aching    Pain Type  Acute pain    Pain Radiating Towards  none     Pain Onset  More than a month ago    Pain Frequency  Constant    Aggravating Factors   bending and sitting too long    Pain Relieving Factors  changing position, stretching                      [facility] Adult PT Treatment/Exercise - 10/06/17 0001      Self-Care   Self-Care  Lifting    Lifting  use of lunge position to prevent excessive trunk flexion and strain on the back       Lumbar Exercises: Standing   Wall Slides  10 reps;Limitations    Wall Slides Limitations  x2 sets, yellow TB     Other Standing Lumbar Exercises  hip extension 2x10 reps each; standing hip abduction x10 reps with UE support       Lumbar Exercises: Supine   Clam  15 reps    Clam Limitations  green TB    Bent Knee Raise  Limitations;10 reps;Other (comment) x2 sets     Bent Knee Raise Limitations  Bent knee lower       Lumbar Exercises: Prone   Straight Leg Raise  20  reps without pillow              PT Education - 10/06/17 1522    Education provided  Yes    Education Details  lifting mechanics and getting up from the floor    Person(s) Educated  Patient    Methods  Explanation;Handout    Comprehension  Verbalized understanding;Returned demonstration       PT Short Term Goals - 10/04/17 1457      PT SHORT TERM GOAL #2   Title  The patient will report a 30% improvement in peripheral right LE symptoms    Status  Achieved      PT SHORT TERM GOAL #3   Title  The patient will be able to sit for 15 min with minimal back, hip and leg pain    Time  4    Period  Weeks    Status  On-going        PT Long Term Goals - 10/04/17 1458      PT LONG TERM GOAL #1   Title  The patient will be independent with safe self progression of HEP and independence with self management techniques    Time  8    Period  Weeks    Status  On-going      PT LONG TERM GOAL #2   Title  The patient will report a 60% improvement in right  back, hip and LE symptoms with usual ADLs    Baseline  90% improvement    Status  Achieved            Plan - 10/06/17 1526    Clinical Impression Statement  Pt continues to do well, without report of low back pain or symptoms with daily activity. Today's session focused on progressions of LE strength and abdominal strength/endurance. Pt did require some additional feedback with standing exercises, however she was able to make the necessary adjustments without report of pain. Will continue with current POC.    Rehab Potential  Good    PT Frequency  2x / week    PT Duration  8 weeks    PT Treatment/Interventions  Cryotherapy;Electrical Stimulation;Ultrasound;Moist Heat;Iontophoresis 75m/ml Dexamethasone;Therapeutic exercise;Therapeutic activities;Neuromuscular re-education;Patient/family education;Manual techniques;Dry needling;Taping;Traction    PT Next Visit Plan  core and hip strength and flexibility, extension exercises, dry needle Rt glutes and lumbar parapsinals as needed    Consulted and Agree with Plan of Care  Patient       Patient will benefit from skilled therapeutic intervention in order to improve the following deficits and impairments:  Increased fascial restricitons, Pain, Postural dysfunction, Decreased activity tolerance, Decreased range of motion, Decreased strength, Difficulty walking  Visit Diagnosis: Acute right-sided low back pain with right-sided sciatica  Muscle weakness (generalized)  Radiculopathy, lumbar region     Problem List Patient Active Problem List   Diagnosis Date Noted  . Degenerative arthritis of right knee 05/10/2017  . Degenerative joint disease of right knee 05/10/2017  . Rheumatoid arthritis(714.0)     3:31 PM,10/06/17 SElly ModenaPT, DPT [place]Y [place] SUMMARY  Visits from Start of Care: 7  Current functional level related to goals / functional  outcomes: See above for current status. Pt didn't return to PT after visit on 10/06/17.   Remaining deficits: See above for current status.     Education / Equipment: HEP, body mechanics/posture education Plan: Patient agrees to discharge.  Patient goals were partially met.  Patient is being discharged due to not returning since the last visit.  ?????        Sigurd Sos, PT 11/04/17 9:24 AM  [facility] [address], [place], [place], [address] Phone: [phone]   Fax:  [phone]  Name: Caitlin Wagner MRN: [id number] Date of Birth: 1966-07-16

## 2017-10-06 NOTE — Patient Instructions (Signed)
  WALL SQUATS  Leaning up against a wall or closed door on your back, slide your body downward and then return back to upright position.  A door was used here because it was smoother and had less friction than the wall.   Knees should bend and wear yellow band around knees to prevent them from collapsing inward.  x10 reps  [facility] [address] [place], [place] [address] Phone # [phone] Fax [phone]

## 2017-10-19 DIAGNOSIS — R87610 Atypical squamous cells of undetermined significance on cytologic smear of cervix (ASC-US): Secondary | ICD-10-CM

## 2017-10-19 HISTORY — PX: SURGICAL HX OTHER: 99

## 2017-10-19 HISTORY — DX: Atypical squamous cells of undetermined significance on cytologic smear of cervix (ASC-US): R87.610

## 2017-11-08 ENCOUNTER — Ambulatory Visit: Payer: Medicare Other | Attending: Family Medicine | Admitting: Physical Therapy

## 2017-11-08 DIAGNOSIS — M5416 Radiculopathy, lumbar region: Secondary | ICD-10-CM | POA: Diagnosis present

## 2017-11-08 DIAGNOSIS — M5441 Lumbago with sciatica, right side: Secondary | ICD-10-CM

## 2017-11-08 DIAGNOSIS — M6281 Muscle weakness (generalized): Secondary | ICD-10-CM

## 2017-11-08 NOTE — Patient Instructions (Signed)
   SIT TO STAND - NO SUPPORT  Start by scooting close to the front of the chair.  Next, lean forward at your trunk and reach forward with your arms and rise to standing without using your hands to push off from the chair or other object.   Use your arms as a counter-balance by reaching forward when in sitting and lower them as you approach standing. x15 reps, repeat 2x.      Sidesteps with TB at ankle  Stand with knees slightly bent, band around your ankles. Take a sidestep to the Left with your Left leg, then follow with your Right Leg, maintaining a  stretch on the band the entire time. Continue 10' one way. Repeat the other way (to the Right).  *Go slow  *Maintain Stretch on Band *Keep Knees Slightly Bent *Make Sure you are BREATHING x3-4 trials along the kitchen counter (band around knees, progress to band around ankles)      Dead Bug With Marching / Femur Arc   Lie down on your back with the legs up in the air and the knees and hips bent at 90 degrees. The low back should remain neutral or flat against the table through the whole exercise. Brace the abdominals and slowly lower one leg towards the table. Lightly tap the foot to the table and bring it back up to the starting position. Repeat the motion with the other leg. Try to maintain a 90 degree bend with both knees throughout the exercise. Keep a steady breathing pattern through the whole exercise.    x10-15 reps       HIP ABDUCTION - SIDELYING  While lying on your side, slowly raise up your top leg to the side. Keep your knee straight and maintain your toes pointed forward the entire time. Keep your leg in-line with your body.  The bottom leg can be bent to stabilize your body.  x10-15 reps.     [facility] [address] [place], [place] [address] Phone # [phone] Fax [phone]

## 2017-11-08 NOTE — Therapy (Signed)
[facility] [address], [place], [place], [address] Phone: [phone]   Fax:  [phone]  Physical Therapy Treatment/Discharge   Patient Details  Name: Caitlin Wagner MRN: [id number] Date of Birth: 06/08/1966 Referring Provider: Ronette Deter   Encounter Date: 11/08/2017  PT End of Session - 11/08/17 0845    Visit Number  8    Date for PT Re-Evaluation  11/09/17    Authorization Type  UHC Medicare G codes:  KX at visit 15    PT Start Time  0806    PT Stop Time  0845    PT Time Calculation (min)  39 min    Activity Tolerance  Patient tolerated treatment well;No increased pain    Behavior During Therapy  WFL for tasks assessed/performed       Past Medical History:  Diagnosis Date  . Arthritis    RA  . 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 Rheumatoid arthritis(714.0)     Past Surgical History:  Procedure Laterality Date  . feet reconstruction surgery     bilateral   . TONSILLECTOMY  age 89  . TOTAL KNEE ARTHROPLASTY Left 03/22/2017   Procedure: TOTAL KNEE ARTHROPLASTY w/ Navigation;  Surgeon: Rod Can, MD;  Location: [place];  Service: Orthopedics;  Laterality: Left;  . TOTAL KNEE ARTHROPLASTY Right 05/10/2017   Procedure: TOTAL KNEE ARTHROPLASTY;  Surgeon: Rod Can, MD;  Location: [place];  Service: Orthopedics;  Laterality: Right;    There were no vitals filed for this visit.  Subjective Assessment - 11/08/17 0808    Subjective  Pt reports that things are going well. She has been back to walking. She did have some spasm in her low back after walking 30 minutes yesterday, but she feels that this is nearly improved by this morning. She feels overall 90% improve since beginning PT. She is back to her regular  activity without any difficulty.     Pertinent History  RA;  TKR bil 03/22/17 and 05/13/17    How long can you sit comfortably?  unlimited     How long can you walk comfortably?  up to 30 min walk yesterday; unlimited cooking/cleaning tolerance    Diagnostic tests  MRI hip and LB.  No hip abdnormalities.  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  No/denies    Pain Onset  More than a month ago         [facility] PT Assessment - 11/08/17 0001      Assessment   Medical Diagnosis  lumbar radiculopathy right    Referring Provider  [facility]    Onset Date/Surgical Date  06/27/17    Next MD Visit  none for now     Prior Therapy  HHPT for TKR      Precautions   Precautions  None;Fall    Precaution Comments  no bending/twisting      Restrictions   Weight Bearing Restrictions  No      Balance Screen   Has the patient fallen in the past 6 months  No    Has the patient had a decrease in activity level because of a fear of falling?   No  Is the patient reluctant to leave their home because of a fear of falling?   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; sit to stand from a chair    Leisure  used to walk 3 miles/day; going to movies; needlepoint but hard to sit now      Observation/Other Assessments   Focus on Therapeutic Outcomes (FOTO)   --      Posture/Postural Control   Posture/Postural  Control  Postural limitations    Postural Limitations  Decreased lumbar lordosis    Posture Comments  ulnar drift in bil hands      AROM   Lumbar Flexion  WNL, pain free very painful    Lumbar Extension  WNL, pain free    Lumbar - Right Side Bend  --    Lumbar - Left Side Bend  --      Strength   Right/Left Hip  Left;Right    Right Hip Flexion  5/5    Right Hip Extension  5/5    Right Hip ABduction  4/5    Left Hip Extension  5/5    Left Hip ABduction  4/5    Right/Left Knee  Left    Right Knee Flexion  5/5    Right Knee Extension  5/5    Left Knee Flexion  5/5    Left Knee Extension  5/5    Right Ankle Dorsiflexion  5/5    Right Ankle Plantar Flexion  5/5    Right Ankle Inversion  5/5    Right Ankle Eversion  5/5    Lumbar Flexion  5/5    Lumbar Extension  5/5      Palpation   Palpation comment  minimal tenderness in low back       Slump test   Findings  --    Side  --      Prone Knee Bend Test   Findings  Negative    Side  --      Straight Leg Raise   Findings  Negative    Side   --    Comment  --                  [facility] Adult PT Treatment/Exercise - 11/08/17 0001      Lumbar Exercises: Supine   Large Ball Oblique Isometric  5 reps;3 seconds;Other (comment) without physioball     Other Supine Lumbar Exercises  abdominal brace with bent knee 90 deg, alternating heel tap x10 reps       Lumbar Exercises: Sidelying   Other Sidelying Lumbar Exercises  Rt hip abduction x5 reps, HEP demo              PT Education - 11/08/17 0844    Education provided  Yes    Education Details  goals/progress  since beginning PT; recommendations for HEP moving forward and exercises/machines at the gym that pt can use without placing unnecessary strain on her low back; encouraged pt to have MD send referral for elbow pain/weakness and importance of rest/modalities to ease pain.     Person(s) Educated  Patient    Methods  Explanation;Handout    Comprehension   Verbalized understanding;Returned demonstration       PT Short Term Goals - 11/08/17 0821      PT SHORT TERM GOAL #1   Title  The patient will demonstrate knowledge of basic self care strategies for centralization, pain relief, postural support    Status  Achieved      PT SHORT TERM GOAL #2   Title  The patient will report a 30% improvement in peripheral right LE symptoms    Status  Achieved      PT SHORT TERM GOAL #3   Title  The patient will be able to sit for 15 min with minimal back, hip and leg pain    Baseline  unlimited     Time  4    Period  Weeks    Status  Achieved      PT SHORT TERM GOAL #4   Title  The patient will be able to walk 1/4 mile for community ambulation    Baseline  shopping x 1.5 hours without pain    Status  Achieved        PT Long Term Goals - 11/08/17 9794      PT LONG TERM GOAL #1   Title  The patient will be independent with safe self progression of HEP and independence with self management techniques    Time  8    Period  Weeks    Status  On-going      PT LONG TERM GOAL #2   Title  The patient will report a 60% improvement in right back, hip and LE symptoms with usual ADLs    Baseline  90% improvement    Status  Achieved      PT LONG TERM GOAL #3   Title  The patient will have 4+/5 right LE strength needed for ascending/descending steps safely and for walking longer periods of time    Baseline  4/5 MMT on both LE     Status  Partially Met      PT LONG TERM GOAL #4   Title  Patient will be able to walk 1/2 mile with minimal increase in symptoms    Baseline  30 minutes     Status  Achieved            Plan - 11/08/17 0846    Clinical Impression Statement  Pt was discharged this visit having met all of her short and long term goals since beginning PT. She reports 90% improvement overall, and has recently returned to walking up to 30 min without any difficulty. She demonstrates pain free lumbar AROM, improved hip flexibility and  near full BLE strength. She does have remaining limitations in hip abductor strength, with 4/5 MMT noted today. This will likely continue to improve with further HEP adherence. Pt is planning to join the gym and therapist was able to update a HEP for her to complete for core strengthening and hip strengthening. Therapist also made recommendations for exercises and technique at the gym. Pt had good understanding of everything discussed this session and is in agreement with d/c at this time.  Rehab Potential  Good    PT Frequency  2x / week    PT Duration  8 weeks    PT Treatment/Interventions  Cryotherapy;Electrical Stimulation;Ultrasound;Moist Heat;Iontophoresis 101m/ml Dexamethasone;Therapeutic exercise;Therapeutic activities;Neuromuscular re-education;Patient/family education;Manual techniques;Dry needling;Taping;Traction    PT Next Visit Plan  d/c home with HEP     Consulted and Agree with Plan of Care  Patient       Patient will benefit from skilled therapeutic intervention in order to improve the following deficits and impairments:  Increased fascial restricitons, Pain, Postural dysfunction, Decreased activity tolerance, Decreased range of motion, Decreased strength, Difficulty walking  Visit Diagnosis: Acute right-sided low back pain with right-sided sciatica  Muscle weakness (generalized)  Radiculopathy, lumbar region    PHYSICAL THERAPY DISCHARGE SUMMARY  Visits from Start of Care: 8  Current functional level related to goals / functional outcomes: See above for more details    Remaining deficits: See above for more details    Education / Equipment: See above for more details  Plan: Patient agrees to discharge.  Patient goals were met. Patient is being discharged due to being pleased with the current functional level.  ?????      Problem List Patient Active Problem List   Diagnosis Date Noted  . Degenerative arthritis of right knee 05/10/2017  . Degenerative  joint disease of right knee 05/10/2017  . Rheumatoid arthritis(714.0)    8:52 AM,11/08/17 SSherol DadePT, DPT [facility]at [facility] [address] [place] [address]Phone: [phone]  Fax:  [phone] Name: Caitlin LIZMRN: [id number] of Birth: 207/21/1967

## 2017-11-17 ENCOUNTER — Encounter: Payer: Self-pay | Admitting: Gynecology

## 2017-11-17 ENCOUNTER — Ambulatory Visit (INDEPENDENT_AMBULATORY_CARE_PROVIDER_SITE_OTHER): Payer: Medicare Other | Admitting: Gynecology

## 2017-11-17 VITALS — BP 112/64 | Ht 61.0 in | Wt 173.0 lb

## 2017-11-17 DIAGNOSIS — A609 Anogenital herpesviral infection, unspecified: Secondary | ICD-10-CM

## 2017-11-17 DIAGNOSIS — Z01419 Encounter for gynecological examination (general) (routine) without abnormal findings: Secondary | ICD-10-CM | POA: Diagnosis not present

## 2017-11-17 DIAGNOSIS — Z124 Encounter for screening for malignant neoplasm of cervix: Secondary | ICD-10-CM

## 2017-11-17 MED ORDER — VALACYCLOVIR HCL 500 MG PO TABS: 500.0000 mg | ORAL_TABLET | Freq: Every day | ORAL | 11 refills | Status: DC

## 2017-11-17 NOTE — Patient Instructions (Signed)
Schedule your mammogram.  Follow-up for your colonoscopy this coming year.  Follow-up in 1 year for annual exam.

## 2017-11-17 NOTE — Addendum Note (Signed)
Addended by: Dayna Barker on: 11/17/2017 02:39 PM   Modules accepted: Orders

## 2017-11-17 NOTE — Progress Notes (Addendum)
    Caitlin Wagner January 12, 1966 [id number]        51 y.o.  [id number] for annual gynecologic exam.  Without GYN complaints.    Past medical history,surgical history, problem list, medications, allergies, family history and social history were all reviewed and documented as reviewed in the EPIC chart.  ROS:  Performed with pertinent positives and negatives included in the history, assessment and plan.   Additional significant findings : None   Exam: Caitlin Wagner assistant Vitals:   11/17/17 1357  BP: 112/64  Weight: 173 lb (78.5 kg)  Height: 5\' 1"  (1.549 m)   Body mass index is 32.69 kg/m.  General appearance:  Normal affect, orientation and appearance. Skin: Grossly normal HEENT: Without gross lesions.  No cervical or supraclavicular adenopathy. Thyroid normal.  Lungs:  Clear without wheezing, rales or rhonchi Cardiac: RR, without RMG Abdominal:  Soft, nontender, without masses, guarding, rebound, organomegaly or hernia Breasts:  Examined lying and sitting without masses, retractions, discharge or axillary adenopathy. Pelvic:  Ext, BUS, Vagina: Normal  Cervix: Normal.  Pap smear done  Uterus: Anteverted, normal size, shape and contour, midline and mobile nontender   Adnexa: Without masses or tenderness    Anus and perineum: Normal   Rectovaginal: Normal sphincter tone without palpated masses or tenderness.    Assessment/Plan:  52 y.o. G64P2002 female for annual gynecologic exam with regular menses, vasectomy birth control.   1. Perimenopause.  Patient having sporadic menses.  Regular when they occur.  No prolonged bleeding.  No significant hot flushes or sweats.  Had borderline FSH 2 years ago at 67.  Will keep menstrual calendar and as long as less frequent but regular when they occur will monitor.  If prolonged or atypical bleeding will follow-up for further evaluation. 2. History HSV diagnosed 12/2016.  On Valtrex 500 mg daily.  Tried stopping but had outbreak and reinitiated.   Will keep on Valtrex 500 mg daily this year.  Refill times 1 year provided. 3. 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  Pap smear 2016 showed LGSIL with negative HPV.  Colposcopic biopsy showed LGSIL.  Pap smear/HPV negative 2018.  Pap smear done today. 4. Mammography never.  I again strongly recommended she schedule a screening mammogram.  Most common cancer in women reviewed.  Patient promises to schedule this year.  Breast exam normal today. 5. Colonoscopy planned this coming year as she is turning 52. 6. Health maintenance.  No routine lab work done as patient does this elsewhere.  Follow-up 1 year, sooner as needed.   2019 MD, 2:22 PM 11/17/2017

## 2017-11-24 ENCOUNTER — Encounter: Payer: Self-pay | Admitting: Gynecology

## 2017-11-24 LAB — PAP IG W/ RFLX HPV ASCU

## 2017-11-24 LAB — HUMAN PAPILLOMAVIRUS, HIGH RISK: HPV DNA High Risk: NOT DETECTED

## 2018-02-16 ENCOUNTER — Ambulatory Visit (INDEPENDENT_AMBULATORY_CARE_PROVIDER_SITE_OTHER): Payer: Medicare Other | Admitting: Gynecology

## 2018-02-16 ENCOUNTER — Encounter: Payer: Self-pay | Admitting: Gynecology

## 2018-02-16 VITALS — BP 118/76

## 2018-02-16 DIAGNOSIS — N898 Other specified noninflammatory disorders of vagina: Secondary | ICD-10-CM | POA: Diagnosis not present

## 2018-02-16 LAB — WET PREP FOR TRICH, YEAST, CLUE

## 2018-02-16 MED ORDER — METRONIDAZOLE 500 MG PO TABS: 500.0000 mg | ORAL_TABLET | Freq: Two times a day (BID) | ORAL | 0 refills | Status: DC

## 2018-02-16 NOTE — Patient Instructions (Signed)
Take the Flagyl medication twice daily for 7 days.  Avoid alcohol while taking.  Follow-up if your symptoms persist, worsen or recur. 

## 2018-02-16 NOTE — Progress Notes (Signed)
    Caitlin Wagner [birth year] [id number]        52 y.o.  L3Y1017 presents with initial perirectal irritation but now more.  Vaginally with discharge.  Also with itching.  No odor.  No urinary symptoms such as frequency dysuria urgency low back pain fever or chills.  No nausea vomiting diarrhea constipation.  Past medical history,surgical history, problem list, medications, allergies, family history and social history were all reviewed and documented in the EPIC chart.  Directed ROS with pertinent positives and negatives documented in the history of present illness/assessment and plan.  Exam: [name] assistant Vitals:   02/16/18 1402  BP: 118/76   General appearance:  Normal Abdomen soft nontender without masses guarding rebound Pelvic external BUS vagina with white discharge.  Cervix normal.  Uterus normal size midline mobile nontender.  Adnexa without masses or tenderness.  Assessment/Plan:  52 y.o. G2P2002 wet prep and exam consistent with low-level bacterial vaginosis.  Options for management reviewed.  Will start with Flagyl 500 mg twice daily x7 days.  Alcohol avoidance reviewed.  She asked about adding the cream but will hold for now.  If her symptoms would persist then we will back up with Cleocin vaginal cream.  Patient will follow-up if her symptoms persist, worsen or recur.    [name] MD, 2:22 PM 02/16/2018

## 2018-02-25 ENCOUNTER — Telehealth: Payer: Self-pay | Admitting: *Deleted

## 2018-02-25 MED ORDER — FLUCONAZOLE 150 MG PO TABS: 150.0000 mg | ORAL_TABLET | Freq: Once | ORAL | 0 refills | Status: AC

## 2018-02-25 NOTE — Telephone Encounter (Signed)
Patient was seen on 02/16/18 treated BV infection, now has external vaginal itching and white discharge only, asked if Rx could prescribed for this? Please advise

## 2018-02-25 NOTE — Telephone Encounter (Signed)
Left detailed message on voicemail Rx has been sent

## 2018-02-25 NOTE — Addendum Note (Signed)
Addended by: Aura Camps on: 02/25/2018 09:16 AM   Modules accepted: Orders

## 2018-02-25 NOTE — Telephone Encounter (Signed)
Diflucan 150mg x 1 dose

## 2018-03-15 ENCOUNTER — Other Ambulatory Visit: Payer: Self-pay

## 2018-03-15 DIAGNOSIS — A609 Anogenital herpesviral infection, unspecified: Secondary | ICD-10-CM

## 2018-03-15 MED ORDER — VALACYCLOVIR HCL 500 MG PO TABS: 500.0000 mg | ORAL_TABLET | Freq: Every day | ORAL | 7 refills | Status: DC

## 2018-04-04 ENCOUNTER — Ambulatory Visit: Payer: Self-pay | Admitting: Orthopedic Surgery

## 2018-04-05 NOTE — Pre-Procedure Instructions (Signed)
Caitlin Wagner  04/05/2018      [facility] [facility] [address] - [place], [place] - [address] [facility] [place] [address] Phone: [phone] Fax: [phone]  [id number] - [place], [place] - [address] AT [facility] [address] [facility] [address] [address] [address] [address] [place] [address] Phone: [phone] Fax: [phone]    Your procedure is scheduled on Mon.,, April 11, 2018 from 7:30AM-10:00AM  Report to [facility] Admitting Entrance "A" at 5:30AM  Call this number if you have problems the morning of surgery:  [phone]   Remember:  Do not eat or drink after midnight on June 23rd    Take these medicines the morning of surgery with A SIP OF WATER: Tofacitinib Citrate (XELJANZ). If needed Acetaminophen (TYLENOL)   As of today, stop taking all Other Aspirin Products, Vitamins, Fish oils, and Herbal medications. Also stop all NSAIDS i.e. Advil, Ibuprofen, Motrin, Aleve, Anaprox, Naproxen, BC, Goody Powders, and all Supplements. Including: Meloxicam (MOBIC)    Do not wear jewelry, make-up or nail polish.  Do not wear lotions, powders, colognes, or deodorant.  Do not shave 48 hours prior to surgery.    Do not bring valuables to the hospital.  [facility] is not responsible for any belongings or valuables.  Contacts, dentures or bridgework may not be worn into surgery.  Leave your suitcase in the car.  After surgery it may be brought to your room.  For patients admitted to the hospital, discharge time will be determined by your treatment team.  Patients discharged the day of surgery will not be allowed to drive home.   Special instructions:   [place]- Preparing For Surgery  Before surgery, you can play an important role. Because skin is not sterile, your skin needs to be as free of germs as possible. You can reduce the number of germs on your skin by washing with CHG (chlorahexidine gluconate) Soap before  surgery.  CHG is an antiseptic cleaner which kills germs and bonds with the skin to continue killing germs even after washing.    Oral Hygiene is also important to reduce your risk of infection.  Remember - BRUSH YOUR TEETH THE MORNING OF SURGERY WITH YOUR REGULAR TOOTHPASTE  Please do not use if you have an allergy to CHG or antibacterial soaps. If your skin becomes reddened/irritated stop using the CHG.  Do not shave (including legs and underarms) for at least 48 hours prior to first CHG shower. It is OK to shave your face.  Please follow these instructions carefully.   1. Shower the NIGHT BEFORE SURGERY and the MORNING OF SURGERY with CHG.   2. If you chose to wash your hair, wash your hair first as usual with your normal shampoo.  3. After you shampoo, rinse your hair and body thoroughly to remove the shampoo.  4. Use CHG as you would any other liquid soap. You can apply CHG directly to the skin and wash gently with a scrungie or a clean washcloth.   5. Apply the CHG Soap to your body ONLY FROM THE NECK DOWN.  Do not use on open wounds or open sores. Avoid contact with your eyes, ears, mouth and genitals (private parts). Wash Face and genitals (private parts)  with your normal soap.  6. Wash thoroughly, paying special attention to the area where your surgery will be performed.  7. Thoroughly rinse your body with warm water from the  neck down.  8. DO NOT shower/wash with your normal soap after using and rinsing off the CHG Soap.  9. Pat yourself dry with a CLEAN TOWEL.  10. Wear CLEAN PAJAMAS to bed the night before surgery, wear comfortable clothes the morning of surgery  11. Place CLEAN SHEETS on your bed the night of your first shower and DO NOT SLEEP WITH PETS.  Day of Surgery:  Do not apply any deodorants/lotions.  Please wear clean clothes to the hospital/surgery center.   Remember to brush your teeth WITH YOUR REGULAR TOOTHPASTE.  Please read over the following fact  sheets that you were given. Pain Booklet, Coughing and Deep Breathing, MRSA Information and Surgical Site Infection Prevention

## 2018-04-06 ENCOUNTER — Encounter (HOSPITAL_COMMUNITY)
Admission: RE | Admit: 2018-04-06 | Discharge: 2018-04-06 | Disposition: A | Discharge status: Home / Self Care | Payer: Medicare Other | Source: Ambulatory Visit | Attending: Orthopedic Surgery | Admitting: Orthopedic Surgery

## 2018-04-06 ENCOUNTER — Encounter (HOSPITAL_COMMUNITY): Payer: Self-pay

## 2018-04-06 ENCOUNTER — Other Ambulatory Visit: Payer: Self-pay

## 2018-04-06 DIAGNOSIS — Z01812 Encounter for preprocedural laboratory examination: Secondary | ICD-10-CM | POA: Diagnosis present

## 2018-04-06 LAB — BASIC METABOLIC PANEL
Anion gap: 11 (ref 5–15)
BUN: 11 mg/dL (ref 6–20)
CO2: 25 mmol/L (ref 22–32)
Calcium: 9.1 mg/dL (ref 8.9–10.3)
Chloride: 106 mmol/L (ref 101–111)
Creatinine, Ser: 0.55 mg/dL (ref 0.44–1.00)
GFR calc Af Amer: >60 mL/min (ref >60)
GFR calc non Af Amer: >60 mL/min (ref >60)
Glucose, Bld: 78 mg/dL (ref 65–99)
Potassium: 4 mmol/L (ref 3.5–5.1)
Sodium: 142 mmol/L (ref 135–145)

## 2018-04-06 LAB — TYPE AND SCREEN
ABO/RH(D): A NEG
Antibody Screen: NEGATIVE

## 2018-04-06 LAB — CBC
HCT: 44.8 % (ref 36.0–46.0)
Hemoglobin: 14 g/dL (ref 12.0–15.0)
MCH: 28.5 pg (ref 26.0–34.0)
MCHC: 31.3 g/dL (ref 30.0–36.0)
MCV: 91.1 fL (ref 78.0–100.0)
Platelets: 224 10*3/uL (ref 150–400)
RBC: 4.92 MIL/uL (ref 3.87–5.11)
RDW: 14.6 % (ref 11.5–15.5)
WBC: 5.2 10*3/uL (ref 4.0–10.5)

## 2018-04-06 LAB — SURGICAL PCR SCREEN
MRSA, PCR: NEGATIVE
Staphylococcus aureus: NEGATIVE

## 2018-04-06 NOTE — Progress Notes (Signed)
PATIENT STATED SHE WAS INSTRUCTED TO STOP 10 DAYS PRIOR TO SURGERY ARAVA, XELJANZ AND MOBIC- WHICH SHE DID.  PATIENT DENIES ANY CARDIAC TESTING.  PCP- DR. Andrey Campanile IN [place]

## 2018-04-08 MED ORDER — TRANEXAMIC ACID 1000 MG/10ML IV SOLN
1000.0000 mg | INTRAVENOUS | Status: AC
  Administered 2018-04-11: 1000 mg via INTRAVENOUS
  Filled 2018-04-08: qty 1100

## 2018-04-10 NOTE — Anesthesia Preprocedure Evaluation (Addendum)
Anesthesia Evaluation  Patient identified by MRN, date of birth, ID band Patient awake    Reviewed: Allergy & Precautions, NPO status , Patient's Chart, lab work & pertinent test results  History of Anesthesia Complications Negative for: history of anesthetic complications  Airway Mallampati: II  TM Distance: >3 FB Neck ROM: Full    Dental  (+) Dental Advisory Given   Pulmonary former smoker (quit 2016),    breath sounds clear to auscultation       Cardiovascular negative cardio ROS   Rhythm:Regular Rate:Normal     Neuro/Psych negative neurological ROS     GI/Hepatic negative GI ROS, Neg liver ROS,   Endo/Other  Morbid obesity  Renal/GU negative Renal ROS     Musculoskeletal  (+) Arthritis , Rheumatoid disorders,    Abdominal (+) + obese,   Peds  Hematology   Anesthesia Other Findings   Reproductive/Obstetrics                            Anesthesia Physical Anesthesia Plan  ASA: II  Anesthesia Plan: Spinal   Post-op Pain Management:  Regional for Post-op pain   Induction:   PONV Risk Score and Plan: 2 and Ondansetron and Dexamethasone  Airway Management Planned: Natural Airway and Nasal Cannula  Additional Equipment:   Intra-op Plan:   Post-operative Plan:   Informed Consent: I have reviewed the patients History and Physical, chart, labs and discussed the procedure including the risks, benefits and alternatives for the proposed anesthesia with the patient or authorized representative who has indicated his/her understanding and acceptance.   Dental advisory given  Plan Discussed with: CRNA and Surgeon  Anesthesia Plan Comments: (Plan routine monitors, SAB with adductor canal block for post op analgesia)        Anesthesia Quick Evaluation

## 2018-04-11 ENCOUNTER — Encounter (HOSPITAL_COMMUNITY): Payer: Self-pay

## 2018-04-11 ENCOUNTER — Inpatient Hospital Stay (HOSPITAL_COMMUNITY): Payer: Medicare Other | Admitting: Anesthesiology

## 2018-04-11 ENCOUNTER — Encounter (HOSPITAL_COMMUNITY)
Admission: RE | Disposition: A | Discharge status: Home Health | Payer: Self-pay | Source: Ambulatory Visit | Attending: Orthopedic Surgery

## 2018-04-11 ENCOUNTER — Inpatient Hospital Stay (HOSPITAL_COMMUNITY)
Admission: RE | Admit: 2018-04-11 | Discharge: 2018-04-12 | DRG: 489 | Disposition: A | Discharge status: Home Health | Payer: Medicare Other | Source: Ambulatory Visit | Attending: Orthopedic Surgery | Admitting: Orthopedic Surgery

## 2018-04-11 ENCOUNTER — Inpatient Hospital Stay (HOSPITAL_COMMUNITY): Payer: Medicare Other

## 2018-04-11 DIAGNOSIS — Z96651 Presence of right artificial knee joint: Secondary | ICD-10-CM | POA: Diagnosis present

## 2018-04-11 DIAGNOSIS — T84093A Other mechanical complication of internal left knee prosthesis, initial encounter: Secondary | ICD-10-CM | POA: Diagnosis present

## 2018-04-11 DIAGNOSIS — Z87891 Personal history of nicotine dependence: Secondary | ICD-10-CM | POA: Diagnosis not present

## 2018-04-11 DIAGNOSIS — Y792 Prosthetic and other implants, materials and accessory orthopedic devices associated with adverse incidents: Secondary | ICD-10-CM | POA: Diagnosis present

## 2018-04-11 DIAGNOSIS — M069 Rheumatoid arthritis, unspecified: Secondary | ICD-10-CM | POA: Diagnosis present

## 2018-04-11 DIAGNOSIS — Z6831 Body mass index (BMI) 31.0-31.9, adult: Secondary | ICD-10-CM | POA: Diagnosis not present

## 2018-04-11 DIAGNOSIS — E669 Obesity, unspecified: Secondary | ICD-10-CM | POA: Diagnosis present

## 2018-04-11 DIAGNOSIS — Z79899 Other long term (current) drug therapy: Secondary | ICD-10-CM | POA: Diagnosis not present

## 2018-04-11 DIAGNOSIS — Z09 Encounter for follow-up examination after completed treatment for conditions other than malignant neoplasm: Secondary | ICD-10-CM

## 2018-04-11 HISTORY — PX: I & D KNEE WITH POLY EXCHANGE: SHX5024

## 2018-04-11 LAB — POCT PREGNANCY, URINE: Preg Test, Ur: NEGATIVE

## 2018-04-11 SURGERY — IRRIGATION AND DEBRIDEMENT KNEE WITH POLY EXCHANGE
Anesthesia: Spinal | Site: Knee | Laterality: Left

## 2018-04-11 MED ORDER — MEPERIDINE HCL 50 MG/ML IJ SOLN: 6.2500 mg | INTRAMUSCULAR | Status: DC | PRN

## 2018-04-11 MED ORDER — HYDROCODONE-ACETAMINOPHEN 7.5-325 MG PO TABS
1.0000 | ORAL_TABLET | ORAL | Status: DC | PRN
  Administered 2018-04-12 (×3): 2 via ORAL
  Filled 2018-04-11 (×3): qty 2

## 2018-04-11 MED ORDER — ALUM & MAG HYDROXIDE-SIMETH 200-200-20 MG/5ML PO SUSP: 30.0000 mL | ORAL | Status: DC | PRN

## 2018-04-11 MED ORDER — KETOROLAC TROMETHAMINE 15 MG/ML IJ SOLN
15.0000 mg | Freq: Four times a day (QID) | INTRAMUSCULAR | Status: AC
  Administered 2018-04-11 – 2018-04-12 (×4): 15 mg via INTRAVENOUS
  Filled 2018-04-11 (×4): qty 1

## 2018-04-11 MED ORDER — METOCLOPRAMIDE HCL 5 MG/ML IJ SOLN: 5.0000 mg | Freq: Three times a day (TID) | INTRAMUSCULAR | Status: DC | PRN

## 2018-04-11 MED ORDER — MORPHINE SULFATE (PF) 2 MG/ML IV SOLN: 0.5000 mg | INTRAVENOUS | Status: DC | PRN

## 2018-04-11 MED ORDER — METHOCARBAMOL 1000 MG/10ML IJ SOLN
500.0000 mg | Freq: Four times a day (QID) | INTRAMUSCULAR | Status: DC | PRN
Start: 2018-04-11 — End: 2018-04-12
  Filled 2018-04-11: qty 5

## 2018-04-11 MED ORDER — POVIDONE-IODINE 10 % EX SWAB: 2.0000 "application " | Freq: Once | CUTANEOUS | Status: DC

## 2018-04-11 MED ORDER — ASPIRIN 81 MG PO CHEW
81.0000 mg | CHEWABLE_TABLET | Freq: Two times a day (BID) | ORAL | Status: DC
  Administered 2018-04-11 – 2018-04-12 (×2): 81 mg via ORAL
  Filled 2018-04-11 (×2): qty 1

## 2018-04-11 MED ORDER — BUPIVACAINE-EPINEPHRINE (PF) 0.5% -1:200000 IJ SOLN
INTRAMUSCULAR | Status: DC | PRN
  Administered 2018-04-11: 30 mL

## 2018-04-11 MED ORDER — DEXAMETHASONE SODIUM PHOSPHATE 10 MG/ML IJ SOLN
10.0000 mg | Freq: Once | INTRAMUSCULAR | Status: AC
  Administered 2018-04-12: 10 mg via INTRAVENOUS
  Filled 2018-04-11: qty 1

## 2018-04-11 MED ORDER — MIDAZOLAM HCL 2 MG/2ML IJ SOLN
INTRAMUSCULAR | Status: AC
  Filled 2018-04-11: qty 2

## 2018-04-11 MED ORDER — KETOROLAC TROMETHAMINE 30 MG/ML IJ SOLN
INTRAMUSCULAR | Status: DC | PRN
  Administered 2018-04-11: 30 mg via INTRA_ARTICULAR

## 2018-04-11 MED ORDER — PROPOFOL 10 MG/ML IV BOLUS
INTRAVENOUS | Status: AC
  Filled 2018-04-11: qty 20

## 2018-04-11 MED ORDER — DOCUSATE SODIUM 100 MG PO CAPS
100.0000 mg | ORAL_CAPSULE | Freq: Two times a day (BID) | ORAL | Status: DC
  Administered 2018-04-11 – 2018-04-12 (×3): 100 mg via ORAL
  Filled 2018-04-11 (×3): qty 1

## 2018-04-11 MED ORDER — SODIUM CHLORIDE 0.9 % IJ SOLN
INTRAMUSCULAR | Status: DC | PRN
  Administered 2018-04-11: 30 mL

## 2018-04-11 MED ORDER — SODIUM CHLORIDE 0.9 % IV SOLN
INTRAVENOUS | Status: DC
  Administered 2018-04-11 (×2): via INTRAVENOUS

## 2018-04-11 MED ORDER — PROPOFOL 500 MG/50ML IV EMUL
INTRAVENOUS | Status: DC | PRN
  Administered 2018-04-11: 75 ug/kg/min via INTRAVENOUS

## 2018-04-11 MED ORDER — ONDANSETRON HCL 4 MG PO TABS: 4.0000 mg | ORAL_TABLET | Freq: Four times a day (QID) | ORAL | Status: DC | PRN

## 2018-04-11 MED ORDER — ONDANSETRON HCL 4 MG/2ML IJ SOLN
4.0000 mg | Freq: Four times a day (QID) | INTRAMUSCULAR | Status: DC | PRN
Start: 2018-04-11 — End: 2018-04-12

## 2018-04-11 MED ORDER — METHOCARBAMOL 500 MG PO TABS
500.0000 mg | ORAL_TABLET | Freq: Four times a day (QID) | ORAL | Status: DC | PRN
  Administered 2018-04-11 – 2018-04-12 (×4): 500 mg via ORAL
  Filled 2018-04-11 (×4): qty 1

## 2018-04-11 MED ORDER — ONDANSETRON HCL 4 MG/2ML IJ SOLN
INTRAMUSCULAR | Status: DC | PRN
  Administered 2018-04-11: 4 mg via INTRAVENOUS

## 2018-04-11 MED ORDER — CEFAZOLIN SODIUM-DEXTROSE 2-4 GM/100ML-% IV SOLN
2.0000 g | INTRAVENOUS | Status: AC
  Administered 2018-04-11: 2 g via INTRAVENOUS

## 2018-04-11 MED ORDER — HYDROCODONE-ACETAMINOPHEN 5-325 MG PO TABS
1.0000 | ORAL_TABLET | ORAL | Status: DC | PRN
  Administered 2018-04-11 – 2018-04-12 (×3): 2 via ORAL
  Filled 2018-04-11 (×3): qty 2

## 2018-04-11 MED ORDER — PROMETHAZINE HCL 25 MG/ML IJ SOLN: 6.2500 mg | INTRAMUSCULAR | Status: DC | PRN

## 2018-04-11 MED ORDER — FENTANYL CITRATE (PF) 250 MCG/5ML IJ SOLN
INTRAMUSCULAR | Status: AC
  Filled 2018-04-11: qty 5

## 2018-04-11 MED ORDER — ROPIVACAINE HCL 7.5 MG/ML IJ SOLN
INTRAMUSCULAR | Status: DC | PRN
  Administered 2018-04-11: 20 mL via PERINEURAL

## 2018-04-11 MED ORDER — SODIUM CHLORIDE 0.9 % IR SOLN
Status: DC | PRN
  Administered 2018-04-11: 3000 mL

## 2018-04-11 MED ORDER — PHENOL 1.4 % MT LIQD: 1.0000 | OROMUCOSAL | Status: DC | PRN

## 2018-04-11 MED ORDER — ACETAMINOPHEN 10 MG/ML IV SOLN
1000.0000 mg | INTRAVENOUS | Status: AC
  Administered 2018-04-11: 1000 mg via INTRAVENOUS
  Filled 2018-04-11: qty 100

## 2018-04-11 MED ORDER — POLYETHYLENE GLYCOL 3350 17 G PO PACK: 17.0000 g | PACK | Freq: Every day | ORAL | Status: DC | PRN

## 2018-04-11 MED ORDER — FENTANYL CITRATE (PF) 100 MCG/2ML IJ SOLN: 25.0000 ug | INTRAMUSCULAR | Status: DC | PRN

## 2018-04-11 MED ORDER — METOCLOPRAMIDE HCL 5 MG PO TABS: 5.0000 mg | ORAL_TABLET | Freq: Three times a day (TID) | ORAL | Status: DC | PRN

## 2018-04-11 MED ORDER — MIDAZOLAM HCL 2 MG/2ML IJ SOLN: 0.5000 mg | Freq: Once | INTRAMUSCULAR | Status: DC | PRN

## 2018-04-11 MED ORDER — KETOROLAC TROMETHAMINE 30 MG/ML IJ SOLN
INTRAMUSCULAR | Status: AC
  Filled 2018-04-11: qty 1

## 2018-04-11 MED ORDER — MENTHOL 3 MG MT LOZG: 1.0000 | LOZENGE | OROMUCOSAL | Status: DC | PRN

## 2018-04-11 MED ORDER — LACTATED RINGERS IV SOLN
INTRAVENOUS | Status: DC | PRN
  Administered 2018-04-11: 07:00:00 via INTRAVENOUS

## 2018-04-11 MED ORDER — VALACYCLOVIR HCL 500 MG PO TABS
500.0000 mg | ORAL_TABLET | Freq: Every day | ORAL | Status: DC
  Administered 2018-04-11 – 2018-04-12 (×2): 500 mg via ORAL
  Filled 2018-04-11 (×2): qty 1

## 2018-04-11 MED ORDER — BUPIVACAINE-EPINEPHRINE (PF) 0.5% -1:200000 IJ SOLN
INTRAMUSCULAR | Status: AC
  Filled 2018-04-11: qty 30

## 2018-04-11 MED ORDER — ONDANSETRON HCL 4 MG/2ML IJ SOLN
INTRAMUSCULAR | Status: AC
  Filled 2018-04-11: qty 2

## 2018-04-11 MED ORDER — 0.9 % SODIUM CHLORIDE (POUR BTL) OPTIME
TOPICAL | Status: DC | PRN
  Administered 2018-04-11: 1000 mL

## 2018-04-11 MED ORDER — DIPHENHYDRAMINE HCL 12.5 MG/5ML PO ELIX: 12.5000 mg | ORAL_SOLUTION | ORAL | Status: DC | PRN

## 2018-04-11 MED ORDER — CHLORHEXIDINE GLUCONATE 4 % EX LIQD: 60.0000 mL | Freq: Once | CUTANEOUS | Status: DC

## 2018-04-11 MED ORDER — DEXAMETHASONE SODIUM PHOSPHATE 10 MG/ML IJ SOLN
INTRAMUSCULAR | Status: AC
  Filled 2018-04-11: qty 1

## 2018-04-11 MED ORDER — CEFAZOLIN SODIUM-DEXTROSE 2-4 GM/100ML-% IV SOLN
2.0000 g | Freq: Four times a day (QID) | INTRAVENOUS | Status: AC
  Administered 2018-04-11 (×2): 2 g via INTRAVENOUS
  Filled 2018-04-11 (×2): qty 100

## 2018-04-11 MED ORDER — PHENYLEPHRINE HCL 10 MG/ML IJ SOLN
INTRAVENOUS | Status: DC | PRN
  Administered 2018-04-11: 20 ug/min via INTRAVENOUS

## 2018-04-11 MED ORDER — ACETAMINOPHEN 325 MG PO TABS: 325.0000 mg | ORAL_TABLET | Freq: Four times a day (QID) | ORAL | Status: DC | PRN

## 2018-04-11 MED ORDER — FENTANYL CITRATE (PF) 100 MCG/2ML IJ SOLN
INTRAMUSCULAR | Status: DC | PRN
  Administered 2018-04-11: 50 ug via INTRAVENOUS

## 2018-04-11 MED ORDER — MIDAZOLAM HCL 5 MG/5ML IJ SOLN
INTRAMUSCULAR | Status: DC | PRN
  Administered 2018-04-11 (×2): 1 mg via INTRAVENOUS

## 2018-04-11 MED ORDER — SODIUM CHLORIDE 0.9 % IV SOLN: INTRAVENOUS | Status: DC

## 2018-04-11 SURGICAL SUPPLY — 58 items
ADH SKN CLS APL DERMABOND .7 (GAUZE/BANDAGES/DRESSINGS) ×1
ALCOHOL ISOPROPYL (RUBBING) (MISCELLANEOUS) ×2 IMPLANT
BANDAGE ACE 6X5 VEL STRL LF (GAUZE/BANDAGES/DRESSINGS) ×2 IMPLANT
BANDAGE ESMARK 6X9 LF (GAUZE/BANDAGES/DRESSINGS) ×1 IMPLANT
BLADE SAW RECIP 87.9 MT (BLADE) IMPLANT
BNDG CMPR 9X6 STRL LF SNTH (GAUZE/BANDAGES/DRESSINGS) ×1
BNDG ESMARK 6X9 LF (GAUZE/BANDAGES/DRESSINGS) ×2
CHLORAPREP W/TINT 26ML (MISCELLANEOUS) ×4 IMPLANT
CUFF TOURNIQUET SINGLE 34IN LL (TOURNIQUET CUFF) ×2 IMPLANT
DERMABOND ADVANCED (GAUZE/BANDAGES/DRESSINGS) ×1
DERMABOND ADVANCED .7 DNX12 (GAUZE/BANDAGES/DRESSINGS) ×1 IMPLANT
DRAIN HEMOVAC 7FR (DRAIN) IMPLANT
DRAPE EXTREMITY T 121X128X90 (DRAPE) ×2 IMPLANT
DRAPE SHEET LG 3/4 BI-LAMINATE (DRAPES) ×4 IMPLANT
DRAPE U-SHAPE 47X51 STRL (DRAPES) ×2 IMPLANT
DRAPE UNIVERSAL PACK (DRAPES) ×2 IMPLANT
DRSG AQUACEL AG ADV 3.5X10 (GAUZE/BANDAGES/DRESSINGS) ×2 IMPLANT
DRSG AQUACEL AG ADV 3.5X14 (GAUZE/BANDAGES/DRESSINGS) IMPLANT
DRSG TEGADERM 2-3/8X2-3/4 SM (GAUZE/BANDAGES/DRESSINGS) IMPLANT
ELECT REM PT RETURN 9FT ADLT (ELECTROSURGICAL) ×2
ELECTRODE REM PT RTRN 9FT ADLT (ELECTROSURGICAL) ×1 IMPLANT
EVACUATOR 1/8 PVC DRAIN (DRAIN) IMPLANT
FACESHIELD WRAPAROUND (MASK) ×2 IMPLANT
GLOVE BIO SURGEON STRL SZ8.5 (GLOVE) ×4 IMPLANT
GLOVE BIOGEL PI IND STRL 7.0 (GLOVE) ×1 IMPLANT
GLOVE BIOGEL PI IND STRL 8 (GLOVE) ×1 IMPLANT
GLOVE BIOGEL PI IND STRL 8.5 (GLOVE) ×1 IMPLANT
GLOVE BIOGEL PI INDICATOR 7.0 (GLOVE) ×1
GLOVE BIOGEL PI INDICATOR 8 (GLOVE) ×1
GLOVE BIOGEL PI INDICATOR 8.5 (GLOVE) ×1
GLOVE SURG SS PI 7.0 STRL IVOR (GLOVE) ×2 IMPLANT
GOWN STRL REUS W/TWL 2XL LVL3 (GOWN DISPOSABLE) ×2 IMPLANT
HANDPIECE INTERPULSE COAX TIP (DISPOSABLE) ×2
HOOD PEEL AWAY FLYTE STAYCOOL (MISCELLANEOUS) ×2 IMPLANT
INSERT TIB TRIATH 22X3 SZ3 (Insert) ×2 IMPLANT
KIT BASIN OR (CUSTOM PROCEDURE TRAY) ×2 IMPLANT
MANIFOLD NEPTUNE II (INSTRUMENTS) ×2 IMPLANT
NEEDLE SPNL 18GX3.5 QUINCKE PK (NEEDLE) ×4 IMPLANT
PACK TOTAL JOINT (CUSTOM PROCEDURE TRAY) ×2 IMPLANT
PACK TOTAL KNEE CUSTOM (KITS) ×2 IMPLANT
PADDING CAST COTTON 6X4 STRL (CAST SUPPLIES) ×2 IMPLANT
SAW OSC TIP CART 19.5X105X1.3 (SAW) IMPLANT
SEALER BIPOLAR AQUA 6.0 (INSTRUMENTS) IMPLANT
SET HNDPC FAN SPRY TIP SCT (DISPOSABLE) ×1 IMPLANT
SET PAD KNEE POSITIONER (MISCELLANEOUS) ×2 IMPLANT
SUT ETHIBOND NAB CT1 #1 30IN (SUTURE) ×4 IMPLANT
SUT MNCRL AB 3-0 PS2 27 (SUTURE) ×2 IMPLANT
SUT MON AB 2-0 CT1 36 (SUTURE) ×4 IMPLANT
SUT VIC AB 1 CTX 27 (SUTURE) ×2 IMPLANT
SUT VIC AB 2-0 CT1 27 (SUTURE) ×2
SUT VIC AB 2-0 CT1 TAPERPNT 27 (SUTURE) ×1 IMPLANT
SUT VLOC 180 0 24IN GS25 (SUTURE) ×2 IMPLANT
SWAB COLLECTION DEVICE MRSA (MISCELLANEOUS) ×2 IMPLANT
SWAB CULTURE ESWAB REG 1ML (MISCELLANEOUS) ×2 IMPLANT
SYR 50ML LL SCALE MARK (SYRINGE) ×4 IMPLANT
TOWER CARTRIDGE SMART MIX (DISPOSABLE) IMPLANT
TRAY CATH 16FR W/PLASTIC CATH (SET/KITS/TRAYS/PACK) IMPLANT
WRAP KNEE MAXI GEL POST OP (GAUZE/BANDAGES/DRESSINGS) ×2 IMPLANT

## 2018-04-11 NOTE — Anesthesia Procedure Notes (Signed)
Anesthesia Regional Block: Adductor canal block   Pre-Anesthetic Checklist: ,, timeout performed, Correct Patient, Correct Site, Correct Laterality, Correct Procedure, Correct Position, site marked, Risks and benefits discussed,  Surgical consent,  Pre-op evaluation,  At surgeon's request and post-op pain management  Laterality: Left and Lower  Prep: chloraprep       Needles:  Injection technique: Single-shot  Needle Type: Echogenic Needle     Needle Length: 9cm  Needle Gauge: 21     Additional Needles:   Procedures:,,,, ultrasound used (permanent image in chart),,,,  Narrative:  Start time: 04/11/2018 7:17 AM End time: 04/11/2018 7:23 AM Injection made incrementally with aspirations every 5 mL.  Performed by: Personally  Anesthesiologist: Jairo Ben, MD  Additional Notes: Pt identified in Holding room.  Monitors applied. Working IV access confirmed. Sterile prep, drape L thigh.  #21ga ECHOgenic needle into adductor canal with US guidance.  20cc 0.75% Ropivacaine injected incrementally after negative test dose.  Patient asymptomatic, VSS, no heme aspirated, tolerated well.  Sandford Craze, MD

## 2018-04-11 NOTE — Transfer of Care (Signed)
Immediate Anesthesia Transfer of Care Note  Patient: Caitlin Wagner  Procedure(s) Performed: REVISION LEFT TOTAL KNEE- POLY EXCHANGE (Left Knee)  Patient Location: PACU  Anesthesia Type:Spinal and MAC combined with regional for post-op pain  Level of Consciousness: awake, alert  and oriented  Airway & Oxygen Therapy: Patient Spontanous Breathing and Patient connected to face mask oxygen  Post-op Assessment: Report given to RN and Post -op Vital signs reviewed and stable  Post vital signs: Reviewed and stable  Last Vitals:  Vitals Value Taken Time  BP 90/55 04/11/2018  9:34 AM  Temp    Pulse 73 04/11/2018  9:36 AM  Resp 13 04/11/2018  9:36 AM  SpO2 99 % 04/11/2018  9:36 AM  Vitals shown include unvalidated device data.  Last Pain:  Vitals:   04/11/18 1610  TempSrc: Oral  PainSc:       Patients Stated Pain Goal: 3 (96/04/54 0981)  Complications: No apparent anesthesia complications

## 2018-04-11 NOTE — Discharge Instructions (Signed)
° °Dr. Morena Mckissack °Total Joint Specialist °[facility] °[address] °[place], [place] [address] °[phone] ° °TOTAL KNEE REPLACEMENT POSTOPERATIVE DIRECTIONS ° ° ° °Knee Rehabilitation, Guidelines Following Surgery  °Results after knee surgery are often greatly improved when you follow the exercise, range of motion and muscle strengthening exercises prescribed by your doctor. Safety measures are also important to protect the knee from further injury. Any time any of these exercises cause you to have increased pain or swelling in your knee joint, decrease the amount until you are comfortable again and slowly increase them. If you have problems or questions, call your caregiver or physical therapist for advice.  ° °WEIGHT BEARING °Weight bearing as tolerated with assist device (walker, cane, etc) as directed, use it as long as suggested by your surgeon or therapist, typically at least 4-6 weeks. ° °HOME CARE INSTRUCTIONS  °Remove items at home which could result in a fall. This includes throw rugs or furniture in walking pathways.  °Continue medications as instructed at time of discharge. °You may have some home medications which will be placed on hold until you complete the course of blood thinner medication.  °You may start showering once you are discharged home but do not submerge the incision under water. Just pat the incision dry and apply a dry gauze dressing on daily. °Walk with walker as instructed.  °You may resume a sexual relationship in one month or when given the OK by your doctor.  °· Use walker as long as suggested by your caregivers. °· Avoid periods of inactivity such as sitting longer than an hour when not asleep. This helps prevent blood clots.  °You may put full weight on your legs and walk as much as is comfortable.  °You may return to work once you are cleared by your doctor.  °Do not drive a car for 6 weeks or until released by you surgeon.  °· Do not drive  while taking narcotics.  °Wear the elastic stockings for three weeks following surgery during the day but you may remove then at night. °Make sure you keep all of your appointments after your operation with all of your doctors and caregivers. You should call the office at the above phone number and make an appointment for approximately two weeks after the date of your surgery. °Do not remove your surgical dressing. The dressing is waterproof; you may take showers in 3 days, but do not take tub baths or submerge the dressing. °Please pick up a stool softener and laxative for home use as long as you are requiring pain medications. °· ICE to the affected knee every three hours for 30 minutes at a time and then as needed for pain and swelling.  Continue to use ice on the knee for pain and swelling from surgery. You may notice swelling that will progress down to the foot and ankle.  This is normal after surgery.  Elevate the leg when you are not up walking on it.   °It is important for you to complete the blood thinner medication as prescribed by your doctor. °· Continue to use the breathing machine which will help keep your temperature down.  It is common for your temperature to cycle up and down following surgery, especially at night when you are not up moving around and exerting yourself.  The breathing machine keeps your lungs expanded and your temperature down. ° °RANGE OF MOTION AND STRENGTHENING EXERCISES  °Rehabilitation of the knee is important following   a knee injury or an operation. After just a few days of immobilization, the muscles of the thigh which control the knee become weakened and shrink (atrophy). Knee exercises are designed to build up the tone and strength of the thigh muscles and to improve knee motion. Often times heat used for twenty to thirty minutes before working out will loosen up your tissues and help with improving the range of motion but do not use heat for the first two weeks following  surgery. These exercises can be done on a training (exercise) mat, on the floor, on a table or on a bed. Use what ever works the best and is most comfortable for you Knee exercises include:  °Leg Lifts - While your knee is still immobilized in a splint or cast, you can do straight leg raises. Lift the leg to 60 degrees, hold for 3 sec, and slowly lower the leg. Repeat 10-20 times 2-3 times daily. Perform this exercise against resistance later as your knee gets better.  °Quad and Hamstring Sets - Tighten up the muscle on the front of the thigh (Quad) and hold for 5-10 sec. Repeat this 10-20 times hourly. Hamstring sets are done by pushing the foot backward against an object and holding for 5-10 sec. Repeat as with quad sets.  °A rehabilitation program following serious knee injuries can speed recovery and prevent re-injury in the future due to weakened muscles. Contact your doctor or a physical therapist for more information on knee rehabilitation.  ° °SKILLED REHAB INSTRUCTIONS: °If the patient is transferred to a skilled rehab facility following release from the hospital, a list of the current medications will be sent to the facility for the patient to continue.  When discharged from the skilled rehab facility, please have the facility set up the patient's Home Health Physical Therapy prior to being released. Also, the skilled facility will be responsible for providing the patient with their medications at time of release from the facility to include their pain medication, the muscle relaxants, and their blood thinner medication. If the patient is still at the rehab facility at time of the two week follow up appointment, the skilled rehab facility will also need to assist the patient in arranging follow up appointment in our office and any transportation needs. ° °MAKE SURE YOU:  °Understand these instructions.  °Will watch your condition.  °Will get help right away if you are not doing well or get worse.   ° ° °Pick up stool softner and laxative for home use following surgery while on pain medications. °Do NOT remove your dressing. You may shower.  °Do not take tub baths or submerge incision under water. °May shower starting three days after surgery. °Please use a clean towel to pat the incision dry following showers. °Continue to use ice for pain and swelling after surgery. °Do not use any lotions or creams on the incision until instructed by your surgeon. ° °

## 2018-04-11 NOTE — Evaluation (Signed)
Physical Therapy Evaluation Patient Details Name: Caitlin Wagner MRN: [id number] DOB: May 22, 1966 Today's Date: 04/11/2018   History of Present Illness  52 y.o. female admitted on 04/11/18 for L TKA revision/polyexchange.  Pt with significant PMH of spinal stenosis, RA, L TKA (03/2017), R TKA (04/2017), and bil foot reconstruction surgery.   Clinical Impression  Pt is POD #0 and is moving well, overall min guard assist with RW.  Pt was able to walk a short distance in the room and get up to the recliner chair this evening. HEP given and review initiated.  Pt will likely progress well.   PT to follow acutely for deficits listed below.       Follow Up Recommendations Follow surgeon's recommendation for DC plan and follow-up therapies;Supervision for mobility/OOB    Equipment Recommendations  None recommended by PT    Recommendations for Other Services   NA    Precautions / Restrictions Precautions Precautions: Knee Precaution Booklet Issued: Yes (comment) Precaution Comments: knee exercise handout given, precaution reviewed.  Restrictions Weight Bearing Restrictions: Yes LLE Weight Bearing: Weight bearing as tolerated      Mobility  Bed Mobility Overal bed mobility: Modified Independent                Transfers Overall transfer level: Needs assistance Equipment used: Rolling walker (2 wheeled) Transfers: Sit to/from Stand Sit to Stand: Min guard         General transfer comment: Min guard assist for safety  Ambulation/Gait Ambulation/Gait assistance: Min guard Gait Distance (Feet): 10 Feet Assistive device: Rolling walker (2 wheeled) Gait Pattern/deviations: Step-to pattern;Antalgic     General Gait Details: Pt with mildly antalgic gait pattern, verbal cues for safety.       Balance Overall balance assessment: Needs assistance Sitting-balance support: Feet supported;No upper extremity supported Sitting balance-[name] Scale: Good     Standing balance  support: No upper extremity supported;Bilateral upper extremity supported;Single extremity supported Standing balance-[name] Scale: Fair Standing balance comment: close supervision at sink with no hands supported.                              Pertinent Vitals/Pain Pain Assessment: 0-10 Pain Score: 2  Pain Location: left knee Pain Descriptors / Indicators: Aching;Burning Pain Intervention(s): Limited activity within patient's tolerance;Monitored during session;Repositioned    Home Living Family/patient expects to be discharged to:: Private residence Living Arrangements: Spouse/significant other;Children Available Help at Discharge: Family;Available 24 hours/day Type of Home: House Home Access: Stairs to enter Entrance Stairs-Rails: Right Entrance Stairs-Number of Steps: 5 Home Layout: One level Home Equipment: Walker - 2 wheels;Bedside commode;Shower seat;Cane - single point      Prior Function Level of Independence: Independent         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, off for the summer     Hand Dominance   Dominant Hand: Right    Extremity/Trunk Assessment   Upper Extremity Assessment Upper Extremity Assessment: RUE deficits/detail;LUE deficits/detail RUE Deficits / Details: bil hands with significant RA, but per pt they are functional still, does not have difficulty gripping RW for functional mobility.  Pt is R handed.  LUE Deficits / Details: bil hands with significant RA, but per pt they are functional still, does not have difficulty gripping RW for functional mobility.  Pt is R handed.     Lower Extremity Assessment Lower Extremity Assessment: RLE deficits/detail;LLE deficits/detail RLE Deficits / Details: right leg with h/o TKA, but  is doing well compared to her left leg for which pt reports the liner wore out.  Pt with 140 degrees of flexion ROM on R.  LLE Deficits / Details: left leg with normal post op pain and weakness, ankle at least 3/5,  knee 3-/5, hip flexion 3-/5    Cervical / Trunk Assessment Cervical / Trunk Assessment: Other exceptions Cervical / Trunk Exceptions: chart history reports spinal stenosis and pt reports h/o scoliosis.   Communication   Communication: No difficulties  Cognition Arousal/Alertness: Awake/alert Behavior During Therapy: WFL for tasks assessed/performed Overall Cognitive Status: Within Functional Limits for tasks assessed                                           Exercises Total Joint Exercises Ankle Circles/Pumps: AROM;Both;20 reps Quad Sets: AROM;Left;10 reps Towel Squeeze: AROM;Both;10 reps Heel Slides: AROM;Left;10 reps   Assessment/Plan    PT Assessment Patient needs continued PT services  PT Problem List Decreased activity tolerance;Decreased strength;Decreased range of motion;Decreased balance;Decreased mobility;Decreased knowledge of use of DME;Pain;Decreased knowledge of precautions       PT Treatment Interventions DME instruction;Gait training;Stair training;Functional mobility training;Therapeutic activities;Therapeutic exercise;Balance training;Patient/family education;Manual techniques;Modalities    PT Goals (Current goals can be found in the Care Plan section)  Acute Rehab PT Goals Patient Stated Goal: to avoid more surgery if able PT Goal Formulation: With patient Time For Goal Achievement: 04/18/18 Potential to Achieve Goals: Good    Frequency 7X/week           AM-PAC PT "6 Clicks" Daily Activity  Outcome Measure Difficulty turning over in bed (including adjusting bedclothes, sheets and blankets)?: A Little Difficulty moving from lying on back to sitting on the side of the bed? : A Little Difficulty sitting down on and standing up from a chair with arms (e.g., wheelchair, bedside commode, etc,.)?: A Little Help needed moving to and from a bed to chair (including a wheelchair)?: A Little Help needed walking in hospital room?: A Little Help  needed climbing 3-5 steps with a railing? : A Little 6 Click Score: 18    End of Session   Activity Tolerance: Patient tolerated treatment well Patient left: in chair;with call bell/phone within reach   PT Visit Diagnosis: Muscle weakness (generalized) (M62.81);Difficulty in walking, not elsewhere classified (R26.2);Pain Pain - Right/Left: Left Pain - part of body: Knee    Time: 9604-5409 PT Time Calculation (min) (ACUTE ONLY): 22 min   Charges:          Lurena Joiner B. Shakim Faith, PT, DPT [phone]   PT Evaluation $PT Eval Moderate Complexity: 1 Mod     04/11/2018, 5:45 PM

## 2018-04-11 NOTE — Op Note (Signed)
OPERATIVE REPORT   04/11/2018  9:07 AM  PATIENT:  Caitlin Wagner   SURGEON:  Jonette Pesa, MD  ASSISTANT: Hart Carwin, RNFA.   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  POSTOPERATIVE DIAGNOSIS:  Same.  PROCEDURE: Revision left total knee arthroplasty with polyliner exchange.  ANESTHESIA:   Spinal and regional.  ANTIBIOTICS: 2 g Ancef.   EXPLANTS: Stryker triathlon X3 tibial insert size 3, 11 mm CR.  IMPLANTS: Stryker triathlon X3 tibial insert size 3, 22 mm CS.  SPECIMENS: Right knee synovial fluid for culture.  COMPLICATIONS: None.  DISPOSITION: Stable to PACU.  SURGICAL INDICATIONS:  Caitlin Wagner is a 52 y.o. female with a history of rheumatoid arthritis.  She underwent staged bilateral total knee arthroplasties during the summer 2018.  She was doing well until a couple of months ago, when she began developing instability to the left knee with activity.  She was indicated for polyliner exchange.  The patient understands that if this procedure is unsuccessful, she will require a complete revision.  The risks, benefits, and alternatives were discussed with the patient preoperatively including but not limited to the risks of infection, bleeding, nerve / blood vessel injury, instability, stiffness, cardiopulmonary complications, the need for repeat surgery, among others, and the patient was willing to proceed.  PROCEDURE IN DETAIL: Identified the patient holding area using 2 identifiers.  The surgical site was marked by myself.  Adductor canal block was placed by anesthesia team.  She was taken to the operating room, and spinal anesthesia was obtained.  Foley catheter was placed.  She was positioned supine on the operating room table.  All bony prominences were well-padded.  Tourniquet was applied to the left thigh.  The left lower extremity was prepped and draped in the normal sterile surgical fashion.  Timeout was called  verifying site and site of surgery.  She did receive IV antibiotics within 60 minutes of beginning the procedure.  After Esmarch exsanguination, the tourniquet was inflated to 300 mmHg.  I used a #10 blade to sharply excise her previous scar.  Full-thickness skin flaps were created.  I made a mid vastus arthrotomy.  There was global instability throughout range of motion.  I inspected the synovium.  There was no evidence of inflammation.  No purulence.  She had mildly blood-tinged synovial fluid, which I sent for routine culture.  I do not suspect infection.  I performed a limited medial release.  I inspected her tibial, femoral, and patellar components.  There was no loosening.  I used an osteotome to remove her poly-liner.  Her MCL is intact.  PCL is intact however attenuated.  I used trial liners, and ended up selecting a 22 mm liner.  She had excellent varus/valgus stability throughout a range of motion.  The patella tracked well.  No significant hyperextension.  The knee was then copiously irrigated with normal saline using pulse lavage.  The tourniquet was let down, and meticulous hemostasis was achieved.  I closed the arthrotomy with #1 Ethibond and #0 V lock suture.  Deep dermal layer was closed with 2-0 Monocryl.  Subcutaneous closure was performed with 3-0 running Monocryl subcuticular stitch.  Dermabond was applied to the skin.  Once the glue is fully hardened, Aquacel dressing was applied.  She was then aroused from anesthesia and taken to the PACU in stable condition.  Sponge, needle, and instrument counts were correct at the end of the case x2.  There were no  known complications.  POSTOPERATIVE PLAN: Patient will be admitted to the hospital.  She may weight-bear as tolerated with a walker.  We will place her on aspirin for DVT prophylaxis.  She will work with physical therapy. She may discharge home tomorrow versus the next day with a home exercise program.  I will see her back in 2 weeks for  routine postoperative care.

## 2018-04-11 NOTE — H&P (Signed)
TOTAL KNEE REVISION ADMISSION H&P  Patient is being admitted for left revision total knee arthroplasty.  Subjective:  Chief Complaint:left knee pain.  HPI: Caitlin Wagner, 52 y.o. female, has a history of pain and functional disability in the left knee(s) due to instability of TKA and patient has failed non-surgical conservative treatments for greater than 12 weeks to include flexibility and strengthening excercises, use of assistive devices and activity modification. The indications for the revision of the total knee arthroplasty are instability. Onset of symptoms was gradual starting 1 years ago with gradually worsening course since that time.  Prior procedures on the left knee(s) include arthroplasty.  Patient currently rates pain in the left knee(s) at 6 out of 10 with activity. There is worsening of pain with activity and weight bearing.  Patient has evidence of ingrown TKA components by imaging studies. This condition presents safety issues increasing the risk of falls. This patient has had RA.  There is no current active infection.  Patient Active Problem List   Diagnosis Date Noted  . Degenerative arthritis of right knee 05/10/2017  . Degenerative joint disease of right knee 05/10/2017  . Rheumatoid arthritis(714.0)    Past Medical History:  Diagnosis Date  . Arthritis    RA  . ASCUS of cervix with negative high risk HPV 10/2017  . 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 Rheumatoid arthritis(714.0)   . Spinal stenosis     Past Surgical History:  Procedure Laterality Date  . feet reconstruction surgery     bilateral   . TONSILLECTOMY  age 66  . TOTAL KNEE ARTHROPLASTY Left 03/22/2017   Procedure: TOTAL KNEE ARTHROPLASTY w/ Navigation;  Surgeon: Samson Frederic, MD;  Location: [facility] OR;   Service: Orthopedics;  Laterality: Left;  . TOTAL KNEE ARTHROPLASTY Right 05/10/2017   Procedure: TOTAL KNEE ARTHROPLASTY;  Surgeon: Samson Frederic, MD;  Location: [facility] OR;  Service: Orthopedics;  Laterality: Right;    Current Facility-Administered Medications  Medication Dose Route Frequency Provider Last Rate Last Dose  . 0.9 %  sodium chloride infusion   Intravenous Continuous Daymein Nunnery, Arlys John, MD      . acetaminophen (OFIRMEV) IV 1,000 mg  1,000 mg Intravenous To OR Shafer Swamy, Arlys John, MD      . ceFAZolin (ANCEF) IVPB 2g/100 mL premix  2 g Intravenous On Call to OR Geniece Akers, Arlys John, MD      . chlorhexidine (HIBICLENS) 4 % liquid 4 application  60 mL Topical Once Rohan Juenger, Arlys John, MD      . povidone-iodine 10 % swab 2 application  2 application Topical Once Jamilett Ferrante, Arlys John, MD      . tranexamic acid (CYKLOKAPRON) 1,000 mg in sodium chloride 0.9 % 100 mL IVPB  1,000 mg Intravenous To OR Shrinika Blatz, Arlys John, MD       Facility-Administered Medications Ordered in Other Encounters  Medication Dose Route Frequency Provider Last Rate Last Dose  . fentaNYL (SUBLIMAZE) injection   Intravenous Anesthesia Intra-op Pearson Grippe, CRNA   50 mcg at 04/11/18 2376  . lactated ringers infusion    Continuous PRN Pearson Grippe, CRNA      . midazolam (VERSED) 5 MG/5ML injection   Intravenous Anesthesia Intra-op Pearson Grippe, CRNA   1 mg at 04/11/18 0720   Allergies  Allergen Reactions  . Remicade [Infliximab] Anaphylaxis  .  Dilaudid [Hydromorphone Hcl] Nausea And Vomiting  . Codeine Other (See Comments)    Extreme feeling of something crawling    Social History   Tobacco Use  . Smoking status: Former Smoker    Packs/day: 0.30    Last attempt to quit: 03/13/2015    Years since quitting: 3.0  . Smokeless tobacco: Never Used  Substance Use Topics  . Alcohol use: No    Alcohol/week: 0.0 oz    Family History  Problem Relation Age of Onset  . Heart disease Father        heart attack at  age 42  . Diverticulitis Father   . Cancer Father        Bladder   . Diabetes Paternal Grandmother   . Heart disease Paternal Grandfather   . Heart failure Maternal Grandmother   . Cancer Maternal Grandfather        ? type      ROS   Objective:  Physical Exam  Vital signs in last 24 hours: Temp:  [97.8 F (36.6 C)] 97.8 F (36.6 C) (06/24 6644) Pulse Rate:  [81] 81 (06/24 0632) Resp:  [18] 18 (06/24 0347) BP: (106)/(68) 106/68 (06/24 0632) SpO2:  [96 %] 96 % (06/24 4259) Weight:  [74.8 kg (165 lb)-74.9 kg (165 lb 3.2 oz)] 74.8 kg (165 lb) (06/24 5638)  Labs:  Estimated body mass index is 31.18 kg/m as calculated from the following:   Height as of this encounter: 5\' 1"  (1.549 m).   Weight as of this encounter: 74.8 kg (165 lb).  Imaging Review Plain radiographs demonstrate left knee(s) implants that are ingrown. The overall alignment is neutral. The bone quality appears to be adequate for age and reported activity level.   Preoperative templating of the joint replacement has been completed, documented, and submitted to the Operating Room personnel in order to optimize intra-operative equipment management.   Assessment/Plan:  End stage arthritis, left knee(s) with failed previous arthroplasty.   The patient history, physical examination, clinical judgment of the provider and imaging studies are consistent with end stage degenerative joint disease of the left knee(s), previous total knee arthroplasty. Revision total knee arthroplasty is deemed medically necessary. The treatment options including medical management, injection therapy, arthroscopy and revision arthroplasty were discussed at length. The risks and benefits of revision total knee arthroplasty were presented and reviewed. The risks due to aseptic loosening, infection, stiffness, patella tracking problems, thromboembolic complications and other imponderables were discussed. The patient acknowledged the explanation,  agreed to proceed with the plan and consent was signed. Patient is being admitted for inpatient treatment for surgery, pain control, PT, OT, prophylactic antibiotics, VTE prophylaxis, progressive ambulation and ADL's and discharge planning.The patient is planning to be discharged home with home health services

## 2018-04-11 NOTE — Anesthesia Postprocedure Evaluation (Signed)
Anesthesia Post Note  Patient: Caitlin Wagner  Procedure(s) Performed: REVISION LEFT TOTAL KNEE- POLY EXCHANGE (Left Knee)     Patient location during evaluation: PACU Anesthesia Type: Spinal and Regional Level of consciousness: awake and alert, oriented and patient cooperative Pain management: pain level controlled Vital Signs Assessment: post-procedure vital signs reviewed and stable Respiratory status: spontaneous breathing, nonlabored ventilation and respiratory function stable Cardiovascular status: blood pressure returned to baseline and stable Postop Assessment: no apparent nausea or vomiting and spinal receding Anesthetic complications: no    Last Vitals:  Vitals:   04/11/18 1155 04/11/18 1213  BP:  (!) 103/53  Pulse: 67 (!) 56  Resp: 17 16  Temp: (!) 36.3 C 36.5 C  SpO2: 96% 100%    Last Pain:  Vitals:   04/11/18 1213  TempSrc: Oral  PainSc:                  Doyt Castellana,E. Iridian Reader

## 2018-04-11 NOTE — Anesthesia Procedure Notes (Signed)
Spinal  Patient location during procedure: OR End time: 04/11/2018 7:49 AM Staffing Anesthesiologist: Jairo Ben, MD Performed: anesthesiologist  Preanesthetic Checklist Completed: patient identified, site marked, surgical consent, pre-op evaluation, timeout performed, IV checked, risks and benefits discussed and monitors and equipment checked Spinal Block Patient position: sitting Prep: site prepped and draped and DuraPrep Patient monitoring: blood pressure, continuous pulse ox, cardiac monitor and heart rate Approach: midline Location: L3-4 Injection technique: single-shot Needle Needle type: Pencan  Needle gauge: 24 G Needle length: 9 cm Additional Notes Pt identified in Operating room.  Monitors applied. Working IV access confirmed. Sterile prep, drape lumbar spine.  1% lido local L 3,4.  #24ga Pencan into clear CSF L 3,4.  13.5mg  0.75% Bupivacaine with dextrose injected with asp CSF beginning and end of injection.  Patient asymptomatic, VSS, no heme aspirated, tolerated well.  Sandford Craze, MD

## 2018-04-12 LAB — BASIC METABOLIC PANEL
Anion gap: 5 (ref 5–15)
BUN: 9 mg/dL (ref 6–20)
CO2: 24 mmol/L (ref 22–32)
Calcium: 8.3 mg/dL — ABNORMAL LOW (ref 8.9–10.3)
Chloride: 112 mmol/L — ABNORMAL HIGH (ref 98–111)
Creatinine, Ser: 0.53 mg/dL (ref 0.44–1.00)
GFR calc Af Amer: >60 mL/min (ref >60)
GFR calc non Af Amer: >60 mL/min (ref >60)
Glucose, Bld: 99 mg/dL (ref 70–99)
Potassium: 3.9 mmol/L (ref 3.5–5.1)
Sodium: 141 mmol/L (ref 135–145)

## 2018-04-12 LAB — CBC
HCT: 36.5 % (ref 36.0–46.0)
Hemoglobin: 11.2 g/dL — ABNORMAL LOW (ref 12.0–15.0)
MCH: 28.8 pg (ref 26.0–34.0)
MCHC: 30.7 g/dL (ref 30.0–36.0)
MCV: 93.8 fL (ref 78.0–100.0)
Platelets: 224 10*3/uL (ref 150–400)
RBC: 3.89 MIL/uL (ref 3.87–5.11)
RDW: 14.6 % (ref 11.5–15.5)
WBC: 9 10*3/uL (ref 4.0–10.5)

## 2018-04-12 MED ORDER — HYDROCODONE-ACETAMINOPHEN 5-325 MG PO TABS: 1.0000 | ORAL_TABLET | Freq: Four times a day (QID) | ORAL | 0 refills | Status: DC | PRN

## 2018-04-12 MED ORDER — ONDANSETRON HCL 4 MG PO TABS: 4.0000 mg | ORAL_TABLET | Freq: Four times a day (QID) | ORAL | 0 refills | Status: DC | PRN

## 2018-04-12 MED ORDER — DOCUSATE SODIUM 100 MG PO CAPS: 100.0000 mg | ORAL_CAPSULE | Freq: Two times a day (BID) | ORAL | 1 refills | Status: DC

## 2018-04-12 MED ORDER — ASPIRIN 81 MG PO CHEW: 81.0000 mg | CHEWABLE_TABLET | Freq: Two times a day (BID) | ORAL | 1 refills | Status: DC

## 2018-04-12 NOTE — Progress Notes (Signed)
04/12/2018 Pt is POD #1 and this is her second session. She is moving well with RW, supervision overall.  She has completed stair training twice today and we reviewed all of her HEP exercises.  She is safe from a mobility standpoint to d/c home when MD deems appropriate.  PT to follow acutely until d/c confirmed.   Rollene Rotunda Laniece Hornbaker, PT, [place] #[phone]    04/12/18 1349  PT Visit Information  Last PT Received On 04/12/18  Assistance Needed +1  History of Present Illness 52 y.o. female admitted on 04/11/18 for L TKA revision/polyexchange.  Pt with significant PMH of spinal stenosis, RA, L TKA (03/2017), R TKA (04/2017), and bil foot reconstruction surgery.   Subjective Data  Patient Stated Goal to avoid more surgery if able  Precautions  Precautions Knee  Precaution Booklet Issued Yes (comment)  Precaution Comments knee exercise handout given, precaution reviewed.   Restrictions  LLE Weight Bearing WBAT  Pain Assessment  Pain Assessment 0-10  Pain Score 4  Pain Location left knee  Pain Descriptors / Indicators Aching;Burning  Pain Intervention(s) Limited activity within patient's tolerance;Monitored during session;Repositioned  Cognition  Arousal/Alertness Awake/alert  Behavior During Therapy WFL for tasks assessed/performed  Overall Cognitive Status Within Functional Limits for tasks assessed  Bed Mobility  General bed mobility comments Pt was OOB in the recliner chair.   Transfers  Overall transfer level Modified independent  Ambulation/Gait  Ambulation/Gait assistance Supervision  Gait Distance (Feet) 130 Feet  Assistive device Rolling walker (2 wheeled)  Gait Pattern/deviations Step-through pattern;Antalgic  General Gait Details mildly antalgic gait pattern, verbal cues for safe RW use  Stairs Yes  Stairs assistance Supervision  Stair Management Two rails;Step to pattern;Forwards  Number of Stairs 5  General stair comments Pt able to demonstrate safe technique with correct LE  sequencing without cues.   Balance  Overall balance assessment Needs assistance  Sitting-balance support Feet supported;No upper extremity supported  Sitting balance-[name] Scale Good  Standing balance support No upper extremity supported  Standing balance-[name] Scale Fair  Exercises  Exercises Total Joint  Total Joint Exercises  Ankle Circles/Pumps AROM;Both;20 reps  Quad Sets AROM;Left;10 reps  Heel Slides AROM;Left;10 reps  Towel Squeeze AROM;Both;10 reps  PT - End of Session  Activity Tolerance Patient limited by pain  Patient left in chair;with call bell/phone within reach  Nurse Communication Mobility status;Other (comment) (to RN tech)   PT - Assessment/Plan  PT Plan Current plan remains appropriate  PT Visit Diagnosis Muscle weakness (generalized) (M62.81);Difficulty in walking, not elsewhere classified (R26.2);Pain  Pain - Right/Left Left  Pain - part of body Knee  PT Frequency (ACUTE ONLY) 7X/week  Follow Up Recommendations Follow surgeon's recommendation for DC plan and follow-up therapies;Supervision for mobility/OOB  PT equipment None recommended by PT  AM-PAC PT "6 Clicks" Daily Activity Outcome Measure  Difficulty turning over in bed (including adjusting bedclothes, sheets and blankets)? 3  Difficulty moving from lying on back to sitting on the side of the bed?  3  Difficulty sitting down on and standing up from a chair with arms (e.g., wheelchair, bedside commode, etc,.)? 4  Help needed moving to and from a bed to chair (including a wheelchair)? 4  Help needed walking in hospital room? 4  Help needed climbing 3-5 steps with a railing?  4  6 Click Score 22  Mobility G Code  CJ  PT Goal Progression  Progress towards PT goals Progressing toward goals  PT Time Calculation  PT Start Time (  ACUTE ONLY) 1328  PT Stop Time (ACUTE ONLY) 1344  PT Time Calculation (min) (ACUTE ONLY) 16 min  PT General Charges  $$ ACUTE PT VISIT 1 Visit  PT Treatments  $Gait Training  8-22 mins

## 2018-04-12 NOTE — Discharge Summary (Signed)
Physician Discharge Summary  Patient ID: Caitlin Wagner MRN: [id number] DOB/AGE: 25-Jul-1966 52 y.o.  Admit date: 04/11/2018 Discharge date: 04/12/2018  Admission Diagnoses:  Failed total knee, left, initial encounter [facility])  Discharge Diagnoses:  Principal Problem:   Failed total knee, left, initial encounter [facility])   Past Medical History:  Diagnosis Date  . Arthritis    RA  . ASCUS of cervix with negative high risk HPV 10/2017  . 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 Rheumatoid arthritis(714.0)   . Spinal stenosis     Surgeries: Procedure(s): REVISION LEFT TOTAL KNEE- POLY EXCHANGE on 04/11/2018   Consultants (if any):   Discharged Condition: Improved  Hospital Course: Caitlin Wagner is an 52 y.o. female who was admitted 04/11/2018 with a diagnosis of Failed total knee, left, initial encounter (HCC) and went to the operating room on 04/11/2018 and underwent the above named procedures.    She was given perioperative antibiotics:  Anti-infectives (From admission, onward)   Start     Dose/Rate Route Frequency Ordered Stop   04/11/18 1245  ceFAZolin (ANCEF) IVPB 2g/100 mL premix     2 g 200 mL/hr over 30 Minutes Intravenous Every 6 hours 04/11/18 1005 04/12/18 0127   04/11/18 1015  valACYclovir (VALTREX) tablet 500 mg  Status:  Discontinued     500 mg Oral Daily 04/11/18 1005 04/12/18 2002   04/11/18 0600  ceFAZolin (ANCEF) IVPB 2g/100 mL premix     2 g 200 mL/hr over 30 Minutes Intravenous On call to O.R. 04/11/18 8756 04/11/18 4332    .  She was given sequential compression devices, early ambulation, and lovenox --> ASA for DVT prophylaxis.  She benefited maximally from the hospital stay and there were no complications.    Recent vital signs:  Vitals:   04/12/18 0419 04/12/18  1447  BP: 101/74 110/70  Pulse: 66 61  Resp:    Temp: 97.8 F (36.6 C) 98 F (36.7 C)  SpO2: 98% 98%    Recent laboratory studies:  Lab Results  Component Value Date   HGB 11.2 (L) 04/12/2018   HGB 14.0 04/06/2018   HGB 10.6 (L) 05/11/2017   Lab Results  Component Value Date   WBC 9.0 04/12/2018   PLT 224 04/12/2018   No results found for: INR Lab Results  Component Value Date   NA 141 04/12/2018   K 3.9 04/12/2018   CL 112 (H) 04/12/2018   CO2 24 04/12/2018   BUN 9 04/12/2018   CREATININE 0.53 04/12/2018   GLUCOSE 99 04/12/2018    Discharge Medications:   Allergies as of 04/12/2018      Reactions   Remicade [infliximab] Anaphylaxis   Dilaudid [hydromorphone Hcl] Nausea And Vomiting   Codeine Other (See Comments)   Extreme feeling of something crawling      Medication List    STOP taking these medications   acetaminophen 500 MG tablet Commonly known as:  TYLENOL   leflunomide 20 MG tablet Commonly known as:  ARAVA   metroNIDAZOLE 500 MG tablet Commonly known as:  FLAGYL   XELJANZ 5 MG Tabs Generic drug:  Tofacitinib Citrate     TAKE these medications   aspirin 81 MG chewable tablet Chew 1 tablet (81 mg total) by mouth 2 (two) times daily.   docusate sodium 100  MG capsule Commonly known as:  COLACE Take 1 capsule (100 mg total) by mouth 2 (two) times daily.   HYDROcodone-acetaminophen 5-325 MG tablet Commonly known as:  NORCO/VICODIN Take 1-2 tablets by mouth every 6 (six) hours as needed (prn postop knee pain).   meloxicam 7.5 MG tablet Commonly known as:  MOBIC Take 1 tablet (7.5 mg total) by mouth daily. Can increase to two tablets daily if needed. What changed:    when to take this  additional instructions   ondansetron 4 MG tablet Commonly known as:  ZOFRAN Take 1 tablet (4 mg total) by mouth every 6 (six) hours as needed for nausea.   valACYclovir 500 MG tablet Commonly known as:  VALTREX Take 1 tablet (500 mg total) by mouth  daily.       Diagnostic Studies: Dg Knee Left Port  Result Date: 04/11/2018 CLINICAL DATA:  Status post left knee revision EXAM: PORTABLE LEFT KNEE - 1-2 VIEW COMPARISON:  03/22/2017 FINDINGS: Postsurgical changes are noted consistent with the knee replacement. Air is noted in surgical bed. IMPRESSION: Status post knee revision Electronically Signed   By: Alcide Clever M.D.   On: 04/11/2018 10:48    Disposition:    Discharge Instructions    Call MD / Call 911   Complete by:  As directed    If you experience chest pain or shortness of breath, CALL 911 and be transported to the hospital emergency room.  If you develope a fever above 101 F, pus (white drainage) or increased drainage or redness at the wound, or calf pain, call your surgeon's office.   Constipation Prevention   Complete by:  As directed    Drink plenty of fluids.  Prune juice may be helpful.  You may use a stool softener, such as Colace (over the counter) 100 mg twice a day.  Use MiraLax (over the counter) for constipation as needed.   Diet - low sodium heart healthy   Complete by:  As directed    Do not put a pillow under the knee. Place it under the heel.   Complete by:  As directed    Driving restrictions   Complete by:  As directed    No driving for 2 weeks   Increase activity slowly as tolerated   Complete by:  As directed    Lifting restrictions   Complete by:  As directed    No lifting for 6 weeks   TED hose   Complete by:  As directed    Use stockings (TED hose) for 2 weeks on both leg(s).  You may remove them at night for sleeping.      Follow-up Information    Brahim Dolman, Arlys John, MD. Schedule an appointment as soon as possible for a visit in 2 weeks.   Specialty:  Orthopedic Surgery Why:  For wound re-check Contact information: [address] [place] [address] [phone]        [facility] Follow up.   Specialty:  Home Health Services Why:  A representative from [facility] will contact you to arrange start date and time for your therapy.  Contact information: [address] [place] [address] [phone]            Signed: Iline Oven Jarreau Callanan 04/12/2018, 10:23 PM

## 2018-04-12 NOTE — Care Management Note (Signed)
Case Management Note  Patient Details  Name: Caitlin Wagner MRN: [id number] Date of Birth: 1966/09/29  Subjective/Objective:   52 yr old female s/p left total knee revision.                Action/Plan: Patient was preoperatively setup with [facility], no changes, has DME.     Expected Discharge Date:  04/12/18               Expected Discharge Plan:  Home w Home Health Services  In-House Referral:     Discharge planning Services  CM Consult  Post Acute Care Choice:  Durable Medical Equipment, Home Health Choice offered to:  Patient  DME Arranged:  (has 3in1, RW ) DME Agency:  NA  HH Arranged:  PT HH Agency:  [facility] (formerly [facility])  Status of Service:  Completed, signed off  If discussed at [employer], dates discussed:    Additional Comments:  Durenda Guthrie, RN 04/12/2018, 3:02 PM

## 2018-04-12 NOTE — Progress Notes (Signed)
    Subjective:  Patient reports pain as mild.  Denies N/V/CP/SOB. No c/o.  Objective:   VITALS:   Vitals:   04/11/18 1428 04/11/18 1851 04/11/18 2343 04/12/18 0419  BP: 108/63 (!) 122/102 110/79 101/74  Pulse: 79 81 (!) 57 66  Resp: 16 18    Temp: 97.9 F (36.6 C) 98.1 F (36.7 C) 98.1 F (36.7 C) 97.8 F (36.6 C)  TempSrc: Oral Oral Oral Oral  SpO2: 99% 97% 97% 98%  Weight:      Height:        NAD ABD soft Sensation intact distally Intact pulses distally Dorsiflexion/Plantar flexion intact Incision: dressing C/D/I Compartment soft   Lab Results  Component Value Date   WBC 9.0 04/12/2018   HGB 11.2 (L) 04/12/2018   HCT 36.5 04/12/2018   MCV 93.8 04/12/2018   PLT 224 04/12/2018   BMET    Component Value Date/Time   NA 142 04/06/2018 0936   K 4.0 04/06/2018 0936   CL 106 04/06/2018 0936   CO2 25 04/06/2018 0936   GLUCOSE 78 04/06/2018 0936   BUN 11 04/06/2018 0936   CREATININE 0.55 04/06/2018 0936   CALCIUM 9.1 04/06/2018 0936   GFRNONAA >60 04/06/2018 0936   GFRAA >60 04/06/2018 0936   Recent Results (from the past 240 hour(s))  Surgical pcr screen     Status: None   Collection Time: 04/06/18  9:35 AM  Result Value Ref Range Status   MRSA, PCR NEGATIVE NEGATIVE Final   Staphylococcus aureus NEGATIVE NEGATIVE Final    Comment: (NOTE) The Xpert SA Assay (FDA approved for NASAL specimens in patients 93 years of age and older), is one component of a comprehensive surveillance program. It is not intended to diagnose infection nor to guide or monitor treatment. Performed at [facility], [address], [place], [place] [address]   Aerobic/Anaerobic Culture (surgical/deep wound)     Status: None (Preliminary result)   Collection Time: 04/11/18  8:30 AM  Result Value Ref Range Status   Specimen Description SYNOVIAL LEFT KNEE  Final   Special Requests SPEC A ON SWABS  Final   Gram Stain   Final    NO WBC SEEN NO ORGANISMS SEEN Performed at  [facility], [address] [place], [place] [address]    Culture PENDING  Incomplete   Report Status PENDING  Incomplete     Assessment/Plan: 1 Day Post-Op   Principal Problem:   Failed total knee, left, initial encounter (HCC)   WBAT with walker DVT ppx: lovenox in house - home on ASA, SCDs, TEDS PO pain control PT/OT Dispo: D/C home with HHPT    Iline Oven Hanif Radin 04/12/2018, 7:44 AM   Samson Frederic, MD Cell [phone]

## 2018-04-12 NOTE — Progress Notes (Signed)
Physical Therapy Treatment Patient Details Name: Caitlin Wagner MRN: [id number] DOB: 12/27/1965 Today's Date: 04/12/2018    History of Present Illness 52 y.o. female admitted on 04/11/18 for L TKA revision/polyexchange.  Pt with significant PMH of spinal stenosis, RA, L TKA (03/2017), R TKA (04/2017), and bil foot reconstruction surgery.     PT Comments    Pt is progressing well with gait and mobility, supervision overall for safety.  She was able to practice stairs simulating home entry and we finished reviewing her HEP program.    Follow Up Recommendations  Follow surgeon's recommendation for DC plan and follow-up therapies;Supervision for mobility/OOB     Equipment Recommendations  None recommended by PT    Recommendations for Other Services   NA     Precautions / Restrictions Precautions Precautions: Knee Precaution Booklet Issued: Yes (comment) Precaution Comments: knee exercise handout given, precaution reviewed.  Restrictions Weight Bearing Restrictions: Yes LLE Weight Bearing: Weight bearing as tolerated    Mobility  Bed Mobility Overal bed mobility: Modified Independent                Transfers Overall transfer level: Modified independent                  Ambulation/Gait Ambulation/Gait assistance: Supervision Gait Distance (Feet): 130 Feet Assistive device: Rolling walker (2 wheeled) Gait Pattern/deviations: Step-through pattern;Antalgic     General Gait Details: mildly antalgic gait pattern, verbal cues for safe RW use   Stairs Stairs: Yes Stairs assistance: Supervision Stair Management: Two rails;Step to pattern;Forwards Number of Stairs: 5 General stair comments: Pt able to verbalize correct LE sequencing and then demonstrate on stairs with supervision for safety.           Balance Overall balance assessment: Needs assistance Sitting-balance support: Feet supported;No upper extremity supported Sitting balance-[name] Scale: Good     Standing balance support: No upper extremity supported Standing balance-[name] Scale: Fair                              Cognition Arousal/Alertness: Awake/alert Behavior During Therapy: WFL for tasks assessed/performed Overall Cognitive Status: Within Functional Limits for tasks assessed                                        Exercises Total Joint Exercises Short Arc Quad: AROM;Left;10 reps Hip ABduction/ADduction: AROM;Left;10 reps Straight Leg Raises: AROM;Left;10 reps Long Arc Quad: AROM;Left;10 reps Knee Flexion: AROM;Left;10 reps;Seated        Pertinent Vitals/Pain Pain Assessment: 0-10 Pain Score: 2  Pain Location: left knee Pain Descriptors / Indicators: Aching;Burning Pain Intervention(s): Limited activity within patient's tolerance;Monitored during session;Premedicated before session;Repositioned;Ice applied           PT Goals (current goals can now be found in the care plan section) Acute Rehab PT Goals Patient Stated Goal: to avoid more surgery if able Progress towards PT goals: Progressing toward goals    Frequency    7X/week      PT Plan Current plan remains appropriate       AM-PAC PT "6 Clicks" Daily Activity  Outcome Measure  Difficulty turning over in bed (including adjusting bedclothes, sheets and blankets)?: A Little Difficulty moving from lying on back to sitting on the side of the bed? : A Little Difficulty sitting down on and standing up from a chair  with arms (e.g., wheelchair, bedside commode, etc,.)?: None Help needed moving to and from a bed to chair (including a wheelchair)?: None Help needed walking in hospital room?: None Help needed climbing 3-5 steps with a railing? : None 6 Click Score: 22    End of Session   Activity Tolerance: Patient limited by pain Patient left: in chair;with call bell/phone within reach Nurse Communication: Mobility status;Other (comment)(to Hydrologist) PT Visit  Diagnosis: Muscle weakness (generalized) (M62.81);Difficulty in walking, not elsewhere classified (R26.2);Pain Pain - Right/Left: Left Pain - part of body: Knee     Time: 7591-6384 PT Time Calculation (min) (ACUTE ONLY): 21 min  Charges:  $Gait Training: 8-22 mins          Caitlin Wagner B. Davionne Dowty, PT, DPT [phone]            04/12/2018, 1:48 PM

## 2018-04-12 NOTE — Progress Notes (Signed)
Patient was given written and verbal discharge instructions.  Patient is aware of follow up.  Discharged to home via wheel chair with her family

## 2018-04-13 ENCOUNTER — Encounter (HOSPITAL_COMMUNITY): Payer: Self-pay | Admitting: Orthopedic Surgery

## 2018-04-16 LAB — AEROBIC/ANAEROBIC CULTURE W GRAM STAIN (SURGICAL/DEEP WOUND)
Culture: NO GROWTH
Gram Stain: NONE SEEN

## 2018-04-18 HISTORY — PX: KNEE TOTAL ARTHROPLASTY REVISION: SHX11112

## 2018-06-27 ENCOUNTER — Telehealth (HOSPITAL_BASED_OUTPATIENT_CLINIC_OR_DEPARTMENT_OTHER): Payer: Self-pay | Admitting: Rheumatology

## 2018-06-27 NOTE — Telephone Encounter (Signed)
(  TEXTING IS AN OPTION FOR [facility] CLINICS ONLY)  Is this a [place] clinic? No      RETURN CALL: General message OK      SUBJECT:  General Message     REASON FOR REQUEST: Confirmation of medical records received     MESSAGE: Patient would like to confirm that the clinic has received her medical records from her previous doctor in [place]. Please call patient back. Thank you

## 2018-06-28 NOTE — Telephone Encounter (Signed)
Clinic closed (Eastern time) will try tomorrow.

## 2018-06-28 NOTE — Telephone Encounter (Signed)
It doesn't appear that we have received them yet. Would like to ask pt where they would be coming from and what number they were faxed to.

## 2018-06-28 NOTE — Telephone Encounter (Signed)
Pt called back.  [place] at [phone].  Routing to [employer].  Otila Kluver 9/10

## 2018-06-30 NOTE — Telephone Encounter (Signed)
Records received and referral placed in med review.

## 2018-09-30 NOTE — Progress Notes (Signed)
[facility] [place]  [address] [place] [place]  [address]  TEL: [phone]  l  FAX: [phone]      10/07/2018    PRIMARY CARE PROVIDER:  None PCP  Identifies Patients Without A Pcp Or Unassigned    CONSULTING PROVIDER:  Self Referred              PATIENT: Autumn Camacho    [id number]    Reason for visit:   Rheumatoid arthritis   Referred by Self Referred    History of Present Illness:   Autumn Camacho is a 52 year old female who presents to Rheumatology for an initial visit with a chief complaint of rheumatoid arthritis.  She was diagnosed with rheumatoid arthritis in 1999 in [place]. At that time, she had pain in many of her joints, which made it difficult for her to walk up stairs, hold her baby, and grasp objects. She was previously followed by Dr. Dossie Der at a rheumatology clinic in [place]. She was started on methotrexate and then later started on Enbrel around 2000. She switched from Enbrel to Remicade in 2001 due to changes in her insurance. She took Remicade for at least 5 years with significant benefit, during which time she also took methotrexate. However, she discontinued Remicade due to an anaphylactic reaction. She then took Humira for 5 years but discontinued due to loss of efficacy. She then took Muddy for a few years until this also stopped providing benefit. She then switched to [facility], which she has been taking for the past 3-4 years with benefit and without side effects. She has also been taking leflunomide for the past 3-4 years. She no longer takes methotrexate.     She currently has pain in her hands, wrists, and elbows. She normally has bilateral hip pain, but this pain has not been severe for the past few months. Her joint pain does not wake her up at night and is not severe throughout the day. Her pain is worse in the morning, and she often wakes up with pain she rates an 8/10. She underwent  bilateral knee replacement surgeries in 2018. She also had foot reconstruction surgeries before. She takes [place] 5mg  twice daily, meloxicam 15mg  daily, and leflunomide 20mg  nightly. When she takes medications, her pain is around a 2/10 in the middle of the day.       She has dry eyes and uses eyedrops daily. Her ophthalmologist indicated she did not appear to have any eye damage from rheumatoid arthritis. She has no dry mouth, chest pain, stomach pain, shortness of breath, nausea, rashes, hair loss. Her great aunt had rheumatoid arthritis.       ROS:   Constitutional:  Negative    Eyes:  As noted in HPI above   Ears, Nose, Mouth, Throat:  Negative    Cardiovascular:  Negative    Respiratory:  Negative    Gastrointestinal:  Negative   Genitourinary:  Negative   Musculoskeletal: As noted in HPI.   Skin:  Negative    Neurological:  Negative    Psychiatric:  Negative    Endocrine:  Negative    Hematologic/Lymphatic:  Negative   Allergic/Immunologic:  As noted in HPI above      PHYSICAL EXAMINATION:  Vital signs:  Blood pressure 104/71, pulse 82, temperature 99.5 F (37.5 C), temperature source Temporal, height 5\' 1"  (1.549 m),  weight 165 lb 11.2 oz (75.2 kg), SpO2 96 %.  General:  Healthy, alert, no distress.  Skin:  Skin color, texture, turgor normal. No rashes or concerning lesions on exposed skin.  HEENT:  Normocephalic. No masses, lesions, or abnormalities.   Cardiovascular:  Well-perfused. No clubbing, cyanosis, or edema.   Pulmonary/Chest:  Effort normal.   Abdominal:  Exhibits no distension.  Extremities:  Normal, without deformities, edema, or skin discoloration, radial and DP pulses 2+ bilaterally.  Psychiatric:  Mood, memory, affect, and judgment normal.   Neuro:  Grossly normal to observation, gait normal.  Musculoskeletal:   Hands: Tenderness in MCP1 (both), ulnar deviation in MCPs (both)  Wrists: Tenderness in left wrist  Elbows: No tenderness or swelling, decreased extension in both elbows  Shoulders:  No tenderness or swelling, full ROM  Hips: Full ROM, no tenderness in greater trochanter  Knees: No tenderness or swelling, full ROM, no crepitus.  Ankles: No tenderness or swelling, full ROM    Problem List:  There is no problem list on file for this patient.      Current Medications:   Current Outpatient Medications:   .  leflunomide 20 MG tablet, Take 20 mg by mouth., Disp: , Rfl:   .  meloxicam 15 MG tablet, TK 1 T PO QD, Disp: , Rfl:   .  tofacitinib 5 MG tablet, Take 5 mg by mouth., Disp: , Rfl:   .  valACYclovir 500 MG tablet, , Disp: , Rfl:       Past Medical History:  Past Medical History:   Diagnosis Date   . Rheumatoid arthritis [facility])        Surgical History:  Past Surgical History:   Procedure Laterality Date   . UNLISTED PROCEDURE FEMUR/KNEE     . UNLISTED PROCEDURE FOOT/TOES         Family History:   Her family history includes Cancer in her father; Rheumatoid Arthritis in an other family member.      Social History:  She reports that she has quit smoking. She smoked 0.00 packs per day. She does not have any smokeless tobacco history on file. She reports previous alcohol use. She reports that she does not use drugs.     Social History     Patient does not qualify to have social determinant information on file (likely too young).   Social History Narrative    She moved to [place] from [place] in 04/2018.        Labs:   No results found for this or any previous visit.      Imaging:   None      Impression and Recommendations:   Patient is a 52 year old caucasian female with a history of bilateral knee replacements who came in today for an evaluation of rheumatoid arthritis. She reported chronic pain in joints including hands, elbows, and wrists that is worse in the morning. She currently takes [place], leflunomide, and meloxicam with benefit. She was diagnosed with rheumatoid arthritis in 1999 and has been treated with methotrexate, Enbrel, Remicade, Humira and Orencia.  Exam showed tenderness  in several joints and advanced deformity in hands and elbows.       I discussed that the patient's condition appeared to be somewhat active and ordered additional labs for further work-up. She had a very severe case of RA which led to multiple joint deformity. I counseled her on treatment options. I advised her to switch from [place] to Rinvoq, JAK 1  inhibitor, to see if symptoms improve further. Pertinent side effects of new medicines were reviewed.       Assessment:  1. Rheumatoid arthritis     PLAN:  The following was discussed with the patient.  Recommendations are as follows:  1. Lab(s) ordered today as listed below.    2. Referral to pharmacy sent for Rinvoq. Once this is approved, take 15mg  po qd. Ordered today.    3. Continue Morrie Sheldon 5mg  po BID until this dose is completed in 10/24/2018.   4. Continue meloxicam 15mg  po qd prn.   5. Continue leflunomide 20mg  po qd.     - ANA Reflex Comprehensive Panel  - Anti C. Citrullinated Peptide2  - CBC with Differential  - Comprehensive Metabolic Panel  - CRP, high sensitivity  - RBC Sedimentation Rate  - Rheumatoid Factor  - Hepatitis B Battery (HBSAG w/Rflx PCR, Anti-HBS, Anit-HBC)  - Hepatitis C Ab with Reflex PCR  - Quantiferon-TB Gold Plus      Follow up: 3 months      10/07/2018 @ 10:22 AM - I, Bernell List acted as a Education administrator and documented the service/procedure performed to the best of my knowledge in the presence of Julaine Hua, MD who will provide the final review and authentication.  Signed: Bernell List

## 2018-10-07 ENCOUNTER — Encounter (HOSPITAL_BASED_OUTPATIENT_CLINIC_OR_DEPARTMENT_OTHER): Payer: Self-pay | Admitting: Rheumatology

## 2018-10-07 ENCOUNTER — Ambulatory Visit: Payer: MEDICARE | Attending: Rheumatology | Admitting: Rheumatology

## 2018-10-07 VITALS — BP 104/71 | HR 82 | Temp 99.5°F | Ht 61.0 in | Wt 165.7 lb

## 2018-10-07 DIAGNOSIS — Z6831 Body mass index (BMI) 31.0-31.9, adult: Secondary | ICD-10-CM

## 2018-10-07 DIAGNOSIS — Z79899 Other long term (current) drug therapy: Secondary | ICD-10-CM

## 2018-10-07 DIAGNOSIS — M069 Rheumatoid arthritis, unspecified: Secondary | ICD-10-CM

## 2018-10-07 DIAGNOSIS — Z136 Encounter for screening for cardiovascular disorders: Secondary | ICD-10-CM | POA: Insufficient documentation

## 2018-10-07 LAB — CBC, DIFF
% Basophils: 0 %
% Eosinophils: 0 %
% Immature Granulocytes: 0 %
% Lymphocytes: 23 %
% Monocytes: 7 %
% Neutrophils: 70 %
% Nucleated RBC: 0 %
Absolute Eosinophil Count: 0 10*3/uL (ref 0.00–0.50)
Absolute Lymphocyte Count: 1.17 10*3/uL (ref 1.00–4.80)
Basophils: 0.02 10*3/uL (ref 0.00–0.20)
Hematocrit: 43 % (ref 36–45)
Hemoglobin: 13.5 g/dL (ref 11.5–15.5)
Immature Granulocytes: 0.01 10*3/uL (ref 0.00–0.05)
MCH: 29.1 pg (ref 27.3–33.6)
MCHC: 31.6 g/dL — ABNORMAL LOW (ref 32.2–36.5)
MCV: 92 fL (ref 81–98)
Monocytes: 0.34 10*3/uL (ref 0.00–0.80)
Neutrophils: 3.46 10*3/uL (ref 1.80–7.00)
Nucleated RBC: 0 10*3/uL
Platelet Count: 232 10*3/uL (ref 150–400)
RBC: 4.64 10*6/uL (ref 3.80–5.00)
RDW-CV: 12.8 % (ref 11.6–14.4)
WBC: 5 10*3/uL (ref 4.3–10.0)

## 2018-10-07 LAB — HEPATITIS B BATTERY (HBSAG W/RFLX PCR, ANTI-HBS, ANIT-HBC)
Hepatitis B Core Ab: NONREACTIVE
Hepatitis B Surf Antibody Intl Units: <8 m[IU]/mL
Hepatitis B Surface Ab: NONREACTIVE
Hepatitis B Surface Antigen w/Reflex: NONREACTIVE

## 2018-10-07 LAB — COMPREHENSIVE METABOLIC PANEL
ALT (GPT): 14 U/L (ref 7–33)
AST (GOT): 18 U/L (ref 9–38)
Albumin: 4.1 g/dL (ref 3.5–5.2)
Alkaline Phosphatase (Total): 93 U/L (ref 34–121)
Anion Gap: 8 (ref 4–12)
Bilirubin (Total): 0.4 mg/dL (ref 0.2–1.3)
Calcium: 9.2 mg/dL (ref 8.9–10.2)
Carbon Dioxide, Total: 27 meq/L (ref 22–32)
Chloride: 106 meq/L (ref 98–108)
Creatinine: 0.56 mg/dL (ref 0.38–1.02)
GFR, Calc, African American: >60 mL/min/{1.73_m2} (ref >59)
GFR, Calc, European American: >60 mL/min/{1.73_m2} (ref >59)
Glucose: 76 mg/dL (ref 62–125)
Potassium: 4.1 meq/L (ref 3.6–5.2)
Protein (Total): 6.7 g/dL (ref 6.0–8.2)
Sodium: 141 meq/L (ref 135–145)
Urea Nitrogen: 9 mg/dL (ref 8–21)

## 2018-10-07 LAB — HEPATITIS C AB WITH REFLEX PCR: Hepatitis C Antibody w/Rflx PCR: NONREACTIVE

## 2018-10-07 LAB — SED RATE: Erythrocyte Sedimentation Rate: 41 mm/h — ABNORMAL HIGH (ref 0–20)

## 2018-10-07 LAB — RHEUMATOID FACTOR: Rheumatoid Factor: 668 [IU]/mL — ABNORMAL HIGH (ref <15)

## 2018-10-07 LAB — C_REACTIVE PROTEIN: C_Reactive Protein: 6 mg/L (ref 0.0–10.0)

## 2018-10-07 NOTE — Progress Notes (Signed)
I, Kwanghoon Han, MD, personally performed the services as described in this documentation. All medical record entries made by the scribe were at my direction and in my presence. I have reviewed the chart and discharge instructions and agree that the record reflects my personal performance and is accurate and complete.

## 2018-10-08 LAB — QUANTIFERON-TB GOLD PLUS
Quantiferon Nil Result: 0.04 IU gamma IF/mL
Quantiferon TB Interpretation: NEGATIVE
Quantiferon TB Mitogen minus Nil Result: >10 IU gamma IF/mL
Quantiferon TB1 Ag minus Nil Result: 0 IU gamma IF/mL
Quantiferon TB2 Ag minus Nil Result: 0 IU gamma IF/mL

## 2018-10-10 ENCOUNTER — Ambulatory Visit (HOSPITAL_BASED_OUTPATIENT_CLINIC_OR_DEPARTMENT_OTHER): Payer: Self-pay

## 2018-10-10 LAB — ANTI C. CITRULLINATED PEPTIDE2
Anti CCP2 Comment: POSITIVE
Anti CCP2 Level: >300 U/mL — ABNORMAL HIGH (ref 0.0–1.5)

## 2018-10-10 NOTE — Progress Notes (Signed)
Autumn Camacho has been enrolled in [employer] benefits investigation is underway for rheumatology. See MEDICATION AUTHORIZATION referral for updates.

## 2018-10-11 LAB — ANA REFLEX COMPREHENSIVE PANEL
ANA Pattern by IF: POSITIVE
Ana Interpretation Comment 1: POSITIVE
Ana Screen By Multiplex: NEGATIVE
Antibodies to Nuclear Ags by IF (ANA): POSITIVE — AB

## 2018-10-13 LAB — ANA REFLEX ID PANEL BTY
Anti Centromere B: NEGATIVE
Anti Chromatin: NEGATIVE
Anti Jo1: NEGATIVE
Anti RNP: NEGATIVE
Anti Ribosomal P: NEGATIVE
Anti SSA/Ro: NEGATIVE
Anti SSB/La: NEGATIVE
Anti Scl 70 Antibody: NEGATIVE
Anti Sm/RNP: NEGATIVE
Anti Sm: NEGATIVE
Anti dsDNA (EIA): 2 U/mL (ref 0–14)

## 2018-10-17 ENCOUNTER — Ambulatory Visit (HOSPITAL_BASED_OUTPATIENT_CLINIC_OR_DEPARTMENT_OTHER): Payer: Self-pay | Admitting: Ambulatory Care

## 2018-10-17 DIAGNOSIS — M069 Rheumatoid arthritis, unspecified: Secondary | ICD-10-CM

## 2018-10-17 MED ORDER — UPADACITINIB ER 15 MG OR TB24
15.0000 mg | EXTENDED_RELEASE_TABLET | Freq: Every day | ORAL | 2 refills | Status: DC
Start: 2018-10-17 — End: 2018-12-30

## 2018-10-17 NOTE — Progress Notes (Signed)
Rheumatology - New Rx Reviewed by Gweneth Dimitri, PharmD on 10/17/18     Referral by Dr.Han on 10/07/18 for: Specialty Pharmacy- Rheumatology    Indication: Rheumatoid arthritis involving multiple sites, unspecified rheumatoid factor presence    Therapy Rationale: Sever RA    Therapy Related Testing/Immunizations:   Flu: 08/17/18  PCV13/PPSV23: Need  Shingrix: Need  Quantum Interferon Gold/PPD status: negative 10/07/18  Hep B serologies: negative 10/07/18 with no immunity  Baseline CBC, CMP if applicable: 99/35/70 WNL; no lipids    Current Disease Activity: Active  Active Infection Status: none    Previous rheumatologic medication regimens: methotrexate, leflunomide, enbrel, remicade, humira, orencia, xeljanz    Current prescribed rheumatologic medication regimen:   Xeljanz 5 mg BID  Meloxicam 15 mg po qd prn  leflunomide 20 mg QD    Current Outpatient Medications   Medication Sig Dispense Refill   . leflunomide 20 MG tablet Take 20 mg by mouth.     . meloxicam 15 MG tablet TK 1 T PO QD     . upadacitinib ER (Rinvoq) 15 MG 24 hour tablet Take 1 tablet (15 mg) by mouth daily. 30 tablet 2   . valACYclovir 500 MG tablet        No current facility-administered medications for this visit.     Drug-drug interaction screen with current medications in EPIC as of 10/17/18:   No data filed        [place]    Patient Name: Autumn Camacho, Autumn Camacho  MRN: [id number]  Referral #: [id number]    Prescription Insurance: [employer]  Pharmacy Coverage Subscriber ID: [id number]  Group Number: [id number]      Patient has received insurance approval for coverage of the following medication:    Medication: RINVOQ 15MG  TER    Quantity/Days Supply: 30/30    Approval Dates:      09/10/2018   to     01/08/2019      Approval #: [id number]    Pharmacy Prescription Sent to: [facility] [place] [phone]     Reason for this Pharmacy Choice: Therapist, art of Medication to Patient: 30.00    Financial Assistance: medicare secondary not eligible for co-pay card     Additional Comments:     Pharmacy: Sending Rx to [facility] with refills.  for 1st month supply. Patient presents for teaching visit on 10/28/17.    Gweneth Dimitri, PharmD  [facility]

## 2018-10-28 ENCOUNTER — Ambulatory Visit: Payer: Medicare HMO | Attending: Rheumatology | Admitting: Ambulatory Care

## 2018-10-28 NOTE — Telephone Encounter (Addendum)
ID  Visit type: Telephone  Interpreter present: None  Referral type: Written by Dr. Janan Halter on 10/07/18 for RA treatment initiation and monitoring.     SUBJECTIVE  Autumn Camacho is a 53 year old female, referred to pharmacy for for initiation of Rinvoq.    Indication: Rheumatoid arthritis involving multiple sites, unspecified rheumatoid factor presence   Therapy Rationale: Sever RA      She started Rinvoq on Tuesday and has some indigestion and gas otherwise tolerating well.   States she had pneumonia vaccines somewhere else in the past. Will send records to clinic.   Has not had Shingrix.     Xeljanz 5 mg BID- stopped Monday morning     Therapy Related Testing/Immunizations:   Flu: 08/17/18  PCV13/PPSV23: Need  Shingrix: Need  Quantum Interferon Gold/PPD status: negative 10/07/18  Hep B serologies: negative 10/07/18 with no immunity  Baseline CBC, CMP if applicable: 87/56/43 WNL; no lipids    Current Disease Activity: Active  Active Infection Status: none    Previous rheumatologic medication regimens: methotrexate, leflunomide, enbrel, remicade, humira, orencia, xeljanz    Current prescribed rheumatologic medication regimen:   Rinvoq Xr 15 mg QD  Meloxicam 15 mg po qd prn  leflunomide 20 mg QD    Pharmacy:   [place]     ALLERGIES   Review of patient's allergies indicates:  Allergies   Allergen Reactions   . Infliximab Anaphylaxis   . Codeine Other     "Feels like her skin is crawling"       MEDICATIONS:    Current Outpatient Medications   Medication Sig Dispense Refill   . acetaminophen 500 MG tablet Take 2 tablets by mouth as needed.     . leflunomide 20 MG tablet Take 20 mg by mouth.     . meloxicam 15 MG tablet TK 1 T PO QD     . Multiple Vitamins-Minerals (MULTIVITAL OR) Take 1 tablet by mouth daily.     Marland Kitchen upadacitinib ER (Rinvoq) 15 MG 24 hour tablet Take 1 tablet (15 mg) by mouth daily. 30 tablet 2   . valACYclovir 500 MG tablet         No current facility-administered medications for this visit.        Labs   Office Visit on 10/07/2018   Component Date Value   . Antibodies to Nuclear Ag* 10/07/2018 Positive by IFA.*   . ANA Pattern by IF 10/07/2018 Atypical speckled pattern positive at 1:80.    Wilhemena Durie Screen By Multiplex 10/07/2018 Negative    . Ana Interpretation Comme* 10/07/2018 ANA screen positive. Antibody identification panel ordered.    Marland Kitchen Anti CCP2 Level 10/07/2018 >300.0*   . Anti CCP2 Comment 10/07/2018 Strong positive    . WBC 10/07/2018 5.00    . RBC 10/07/2018 4.64    . Hemoglobin 10/07/2018 13.5    . Hematocrit 10/07/2018 43    . MCV 10/07/2018 92    . [facility] 10/07/2018 29.1    . MCHC 10/07/2018 31.6*   . Platelet Count 10/07/2018 232    . RDW-CV 10/07/2018 12.8    . % Neutrophils 10/07/2018 70    . % Lymphocytes 10/07/2018 23    . % Monocytes 10/07/2018 7    . % Eosinophils 10/07/2018 0    . % Basophils 10/07/2018 0    . % Immature Granulocytes 10/07/2018 0    . Neutrophils 10/07/2018 3.46    .  Absolute Lymphocyte Count 10/07/2018 1.17    . Monocytes 10/07/2018 0.34    . Absolute Eosinophil Count 10/07/2018 0.00    . Basophils 10/07/2018 0.02    . Immature Granulocytes 10/07/2018 0.01    . Nucleated RBC 10/07/2018 0.00    . % Nucleated RBC 10/07/2018 0    . Sodium 10/07/2018 141    . Potassium 10/07/2018 4.1    . Chloride 10/07/2018 106    . Carbon Dioxide, Total 10/07/2018 27    . Anion Gap 10/07/2018 8    . Glucose 10/07/2018 76    . Urea Nitrogen 10/07/2018 9    . Creatinine 10/07/2018 0.56    . Protein (Total) 10/07/2018 6.7    . Albumin 10/07/2018 4.1    . Bilirubin (Total) 10/07/2018 0.4    . Calcium 10/07/2018 9.2    . AST (GOT) 10/07/2018 18    . Alkaline Phosphatase (To* 10/07/2018 93    . ALT (GPT) 10/07/2018 14    . GFR, Calc, European Amer* 10/07/2018 >60    . GFR, Calc, African Ameri* 10/07/2018 >60    . GFR, Information 10/07/2018 Calculated GFR in mL/min/1.73 m2 by MDRD equation.  Inaccurate with changing renal  function.  See [email or web address]    . C_Reactive Protein 10/07/2018 6.0    . Erythrocyte Sedimentatio* 10/07/2018 41*   . Rheumatoid Factor 10/07/2018 668*   . Hepatitis B S Ag w/Rflx * 10/07/2018 Nonreactive    . Hepatitis B Surface Ab 10/07/2018 Nonreactive    . Hepatitis B Surf Antibod* 10/07/2018 <8.00    . Hepatitis B Core Ab 10/07/2018 Nonreactive    . Hepatitis B Battery Inte* 10/07/2018 No evidence of past or present Hepatitis B infection.    . Hepatitis C Antibody w/R* 10/07/2018 Nonreactive    . Hepatitis C Antibody Int* 10/07/2018                      Value:No evidence of antibody to hepatitis C virus (HCV).  Anti-HCV may develop slowly (6 weeks - 6 months) over the course of hepatitis C virus infection.  Rarely it may be absent in cases of chronic   infection.  Anti-HCV may also disappear after recovery from acute, self-limited disease.     . Quantiferon Nil Result 10/07/2018 0.04    . Quantiferon TB1 Ag minus* 10/07/2018 0.00    . Quantiferon TB2 Ag minus* 10/07/2018 0.00    . Quantiferon TB Mitogen m* 10/07/2018 >10.00    . Quantiferon TB Interpret* 10/07/2018 Negative    . Anti dsDNA (EIA) 10/07/2018 2    . Anti Chromatin 10/07/2018 Negative    . Anti Ribosomal P 10/07/2018 Negative    . Anti Sm 10/07/2018 Negative    . Anti Sm/RNP 10/07/2018 Negative    . Anti RNP 10/07/2018 Negative    . Anti SSA/Ro 10/07/2018 Negative    . Anti SSB/La 10/07/2018 Negative    . Anti Centromere B 10/07/2018 Negative    . Anti Scl 70 Antibody 10/07/2018 Negative    . Anti Jo1 10/07/2018 Negative    . ANA Interpretation Comme* 10/07/2018 The specific autoantibodies tested in the confirmatory panel are negative.        REVIEW OF SYSTEMS  No data filed          ASSESSMENT/PLAN    RA Treatment:   Autumn Camacho is a 53 year old female starting treatment with Rinvoq.  Labs are up to date.  Rinvoq (upadacitinib) Education - Teaching points covered with patient:  1. Reviewed  prescription with patient: 15 mg PO daily with or without food   2. Most commonly reported side effects of upadacitinib include: nausea, cough, increased risk of infection, and fever. May take acetaminophen for fever. If temperature continues to be greater than 100.14F, see your PCP and let us know.   3. Rare, but serious side effects of upadacitinib include: increased risk of malignancy (abdominal pain, night sweats, weight loss), increase in lipids, GI perforation,  blood clots, liver toxicity, and rash.  Seek medical attention immediately if you experience any of these.  4. Adherence, importance of not missing doses.   5. Reviewed importance of not receiving live vaccinations while taking upadacitinib.  6. Need for close lab monitoring and importance of keeping follow-up appointments.   7. Instructed patient to contact primary care provider immediately with any signs of infection.    Assessment: Patient demonstrated good understanding of teaching points above. Discussed that indigestion isn't a common side effect and if she had no unusual foods recently it should resolve.     Plan:   1. Start upadacitinib 15 mg daily.  2. Shingrix vaccine at local pharmacy  3. Will send vaccine records to clinic for pneumonia vaccines  4. Lab follow-up and clinic visit on 01/06/19  (CBC w/ differential, lipid panel, CMP), calling sooner PRN.       Possible drug interactions:   Clinically Relevant Drug Interactions Identified:  yes  List Interactions:  Leflunomide and Rinvoq- risk for hematologic toxicity such as pancytopenia, agranulocytosis, and/or thrombocytopenia may be increased.  Drug Management Plan:  Monitoring         Medication Review:  Medication(s) Clinically Reviewed:  yes  Drug(s):  Trialing another JAKi  Indication Appropriate:  yes  Alternative Therapy Appropriate:  no  Medication Optimized:  yes         Adherence  Demonstrates Understanding of Importance of Adherence:  yes  Confirmed Plan for Next Specialty  Medication Refill:  delivery by pharmacy  Refills Needed for Supportive Medications:  not needed           Education/Patient Counseling:    Counseled the Patient on the Following:  possible food interactions, possible drug interactions, adherence and missed doses, cost of medications/cost implications, doses and administration, lab monitoring and follow-up, possible adverse effects and management, safe handling, storage, and disposal, therapeutic rationale, pharmacy contact information         TIME SPENT  I spent a total time of 15 minutes on the phone with the patient, of which more than 50% was spent counseling and coordinating care as outlined in this note.     Gweneth Dimitri, PharmD  [place] [facility]

## 2018-11-09 IMAGING — CR DG KNEE 1-2V PORT*L*
2 series · 2 of 2 positions shown · non-contrast
Comparison: 03/22/2017

CLINICAL DATA: Status post left knee revision

EXAM:
PORTABLE LEFT KNEE - 1-2 VIEW

[AP]
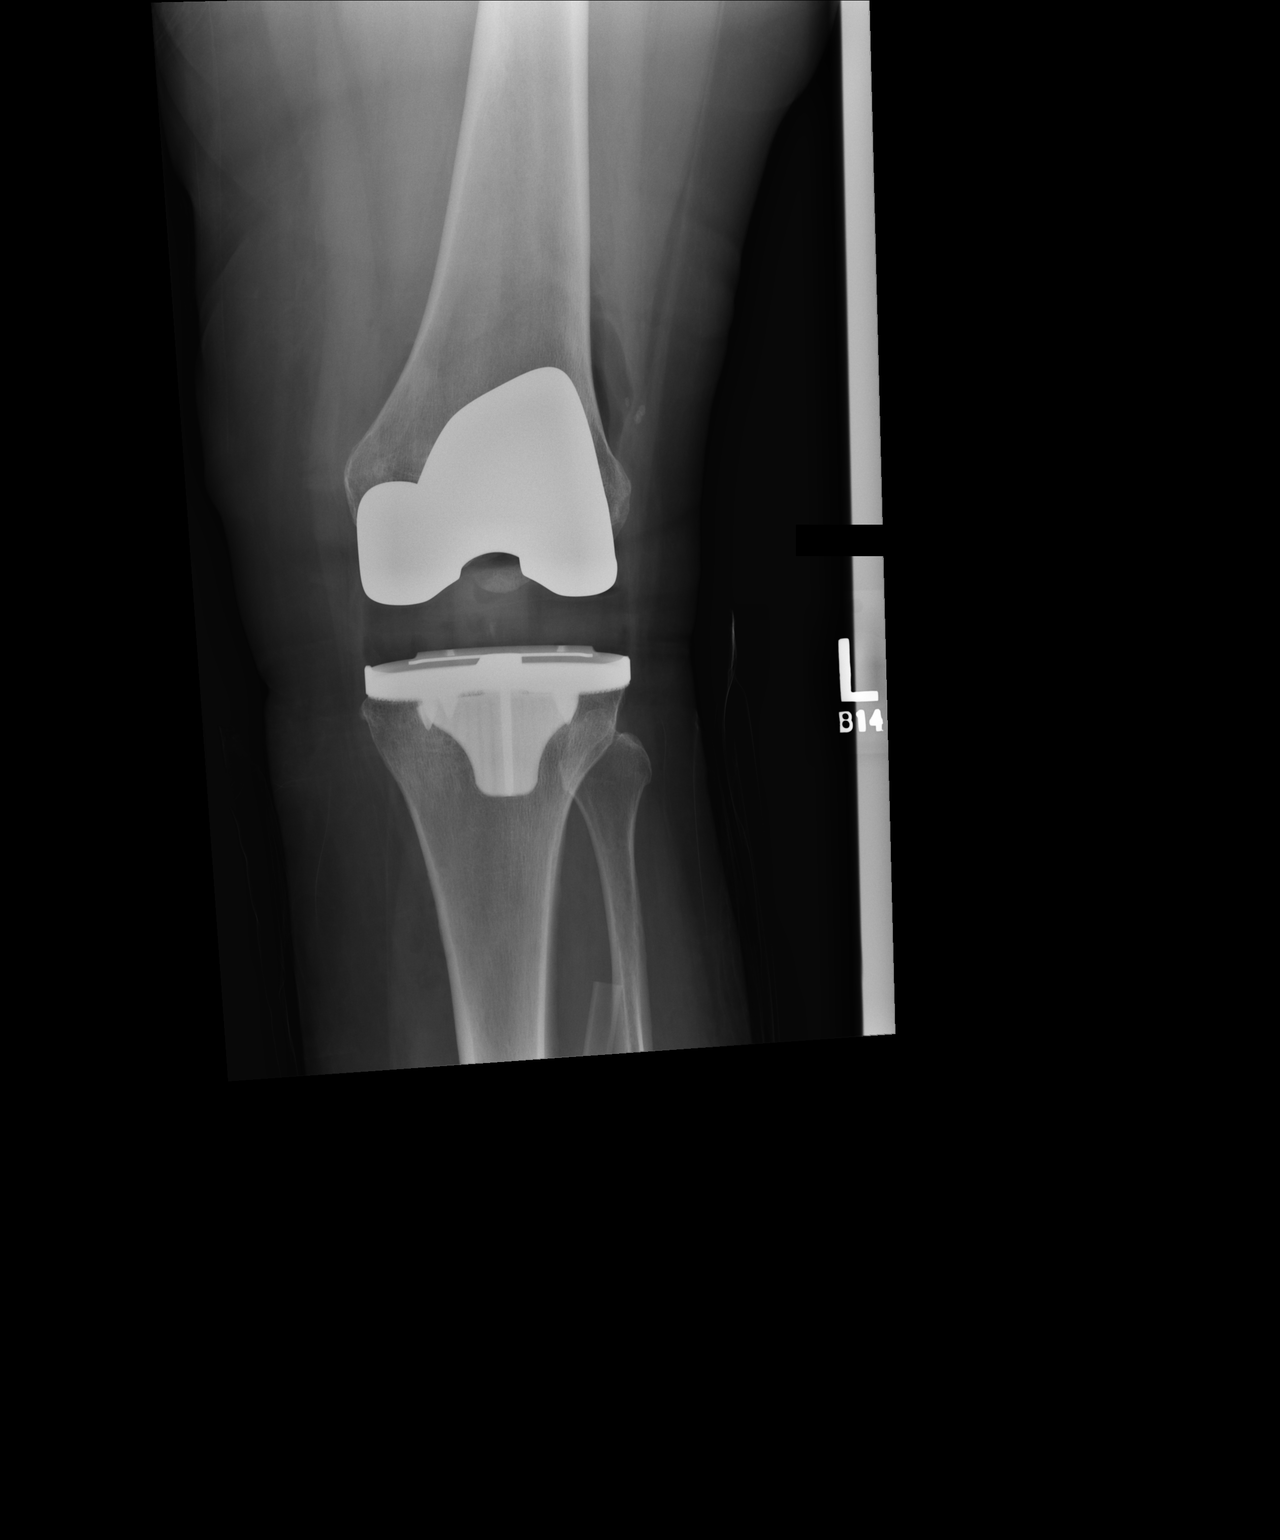

[xtable lateral]
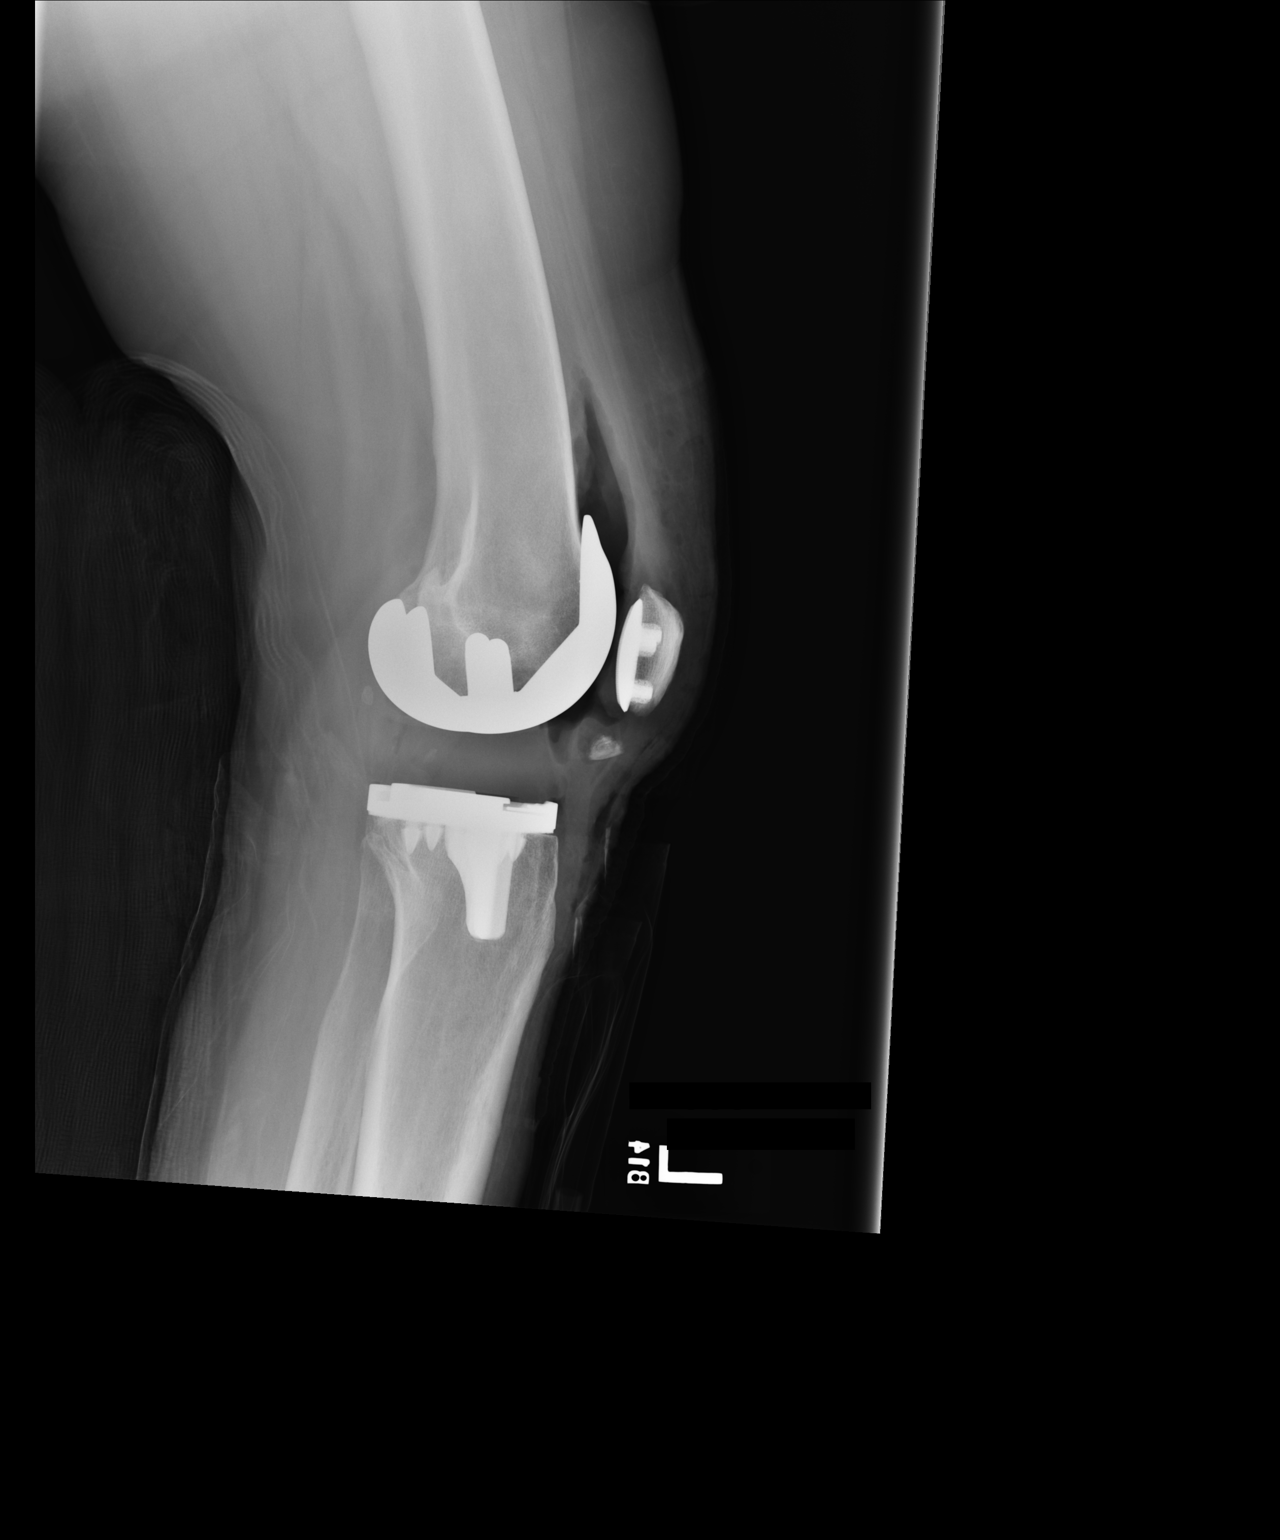

[2 of 2 positions shown; findings below may reference images not displayed]

FINDINGS: Postsurgical changes are noted consistent with the knee replacement.
Air is noted in surgical bed.
IMPRESSION: Status post knee revision

## 2018-11-14 ENCOUNTER — Telehealth (HOSPITAL_BASED_OUTPATIENT_CLINIC_OR_DEPARTMENT_OTHER): Payer: Self-pay | Admitting: Rheumatology

## 2018-11-14 NOTE — Telephone Encounter (Signed)
Documented chronologically in a new TE 1.27.2020.

## 2018-11-14 NOTE — Telephone Encounter (Signed)
Julaine Hua, MDPhysicianSigned  9:51 AM                Please call the patient and inform that there is a single dose of rinvoq. I suggest she try it for one more month and switch to a different drug if her symptoms do not improve. In the meantime, I could prescribe a low dose of prednisone for 2 weeks if she wants.

## 2018-11-14 NOTE — Telephone Encounter (Signed)
Camacho, Autumn ColleenSigned  8:51 AM                Pt called because she recently started taking Rinvoq 15mg  and she's having a lot of break through pain. She would like for up the dosage of this medication. Please call pt back to discuss this medication.

## 2018-11-14 NOTE — Telephone Encounter (Signed)
Please call the patient and inform that there is a single dose of rinvoq. I suggest she try it for one more month and switch to a different drug if her symptoms do not improve. In the meantime, I could prescribe a low dose of prednisone for 2 weeks if she wants.

## 2018-11-14 NOTE — Telephone Encounter (Signed)
Pt called because she recently started taking Rinvoq 15mg  and she's having a lot of break through pain. She would like for up the dosage of this medication. Please call pt back to discuss this medication.

## 2018-11-14 NOTE — Telephone Encounter (Signed)
Patient notified and she voiced understanding that the new medicine will take longer to help.    She declined the prednisone for now.

## 2018-11-18 ENCOUNTER — Encounter: Payer: Medicare Other | Admitting: Gynecology

## 2018-12-12 ENCOUNTER — Telehealth (HOSPITAL_BASED_OUTPATIENT_CLINIC_OR_DEPARTMENT_OTHER): Payer: Self-pay | Admitting: Rheumatology

## 2018-12-12 DIAGNOSIS — B009 Herpesviral infection, unspecified: Secondary | ICD-10-CM

## 2018-12-12 MED ORDER — VALACYCLOVIR HCL 1 G OR TABS
1000.0000 mg | ORAL_TABLET | Freq: Every day | ORAL | 5 refills | Status: DC
Start: 2018-12-12 — End: 2019-05-17

## 2018-12-12 NOTE — Telephone Encounter (Signed)
New RX sent in for 1 tablet (1,000 mg) by mouth daily.    I notified the patient via phone message.

## 2018-12-12 NOTE — Telephone Encounter (Signed)
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 She is starting to have symptoms of an outbreak. Patient states that she discussed a higher dose with Dr. Janan Halter.    She would like a new RX from Dr. Janan Halter    pharmacy confirmed  [facility] [place] [phone]

## 2018-12-12 NOTE — Telephone Encounter (Signed)
Pt calling to see about getting rx for higher dosage of valacyclovir. Please call pt to discuss.

## 2018-12-18 ENCOUNTER — Other Ambulatory Visit: Payer: Self-pay | Admitting: Gynecology

## 2018-12-18 DIAGNOSIS — A609 Anogenital herpesviral infection, unspecified: Secondary | ICD-10-CM

## 2018-12-29 ENCOUNTER — Other Ambulatory Visit (HOSPITAL_BASED_OUTPATIENT_CLINIC_OR_DEPARTMENT_OTHER): Payer: Self-pay | Admitting: Rheumatology

## 2018-12-29 DIAGNOSIS — M069 Rheumatoid arthritis, unspecified: Secondary | ICD-10-CM

## 2018-12-30 MED ORDER — RINVOQ 15 MG OR TB24
EXTENDED_RELEASE_TABLET | ORAL | 0 refills | Status: DC
Start: 2018-12-30 — End: 2019-01-12

## 2019-01-03 ENCOUNTER — Telehealth (HOSPITAL_BASED_OUTPATIENT_CLINIC_OR_DEPARTMENT_OTHER): Payer: Self-pay | Admitting: Rheumatology

## 2019-01-03 NOTE — Telephone Encounter (Signed)
Pt is calling in to see if she should come in for her appt or if she should have a phone visit with Dr. Janan Halter. Please advise.

## 2019-01-04 NOTE — Telephone Encounter (Signed)
Please advise the patient to have a phone visit if her condition is stable.

## 2019-01-04 NOTE — Telephone Encounter (Signed)
PSS team - please contact this patient to schedule a phone visit.    Please return to RN if patient needs to discuss care.

## 2019-01-04 NOTE — Telephone Encounter (Signed)
[facility] at [facility] [place]  [address] [place]  [address]  TEL: [phone]  l  FAX: [phone]      01/06/2019    PRIMARY CARE PROVIDER:  None PCP  Identifies Patients Without A Pcp Or Unassigned    CONSULTING PROVIDER:  No ref. provider found              PATIENT: Autumn Camacho    [id number]    Reason for visit:      HPI:    Autumn Camacho is a 53 year old female who presents for follow-up visit with a chief complaint of ***. Patient was last seen by me on ***. Patient was diagnosed with *** in ***. Patient had a positive ANA test *** and negative ***. Symptoms included ***. Patient was treated with ***    INTERVAL HISTORY:  ***    Problem List:  There is no problem list on file for this patient.      Current Medications:  Current Outpatient Medications   Medication Sig Dispense Refill   . acetaminophen 500 MG tablet Take 2 tablets by mouth as needed.     . leflunomide 20 MG tablet Take 20 mg by mouth.     . meloxicam 15 MG tablet TK 1 T PO QD     . Multiple Vitamins-Minerals (MULTIVITAL OR) Take 1 tablet by mouth daily.     . Rinvoq 15 MG 24 hour tablet TAKE 1 TABLET DAILY . STOP XELJANZ 30 tablet 0   . valACYclovir 1 g tablet Take 1 tablet (1,000 mg) by mouth daily. 30 tablet 5   . valACYclovir 500 MG tablet        No current facility-administered medications for this visit.        ALLERGIES:  Review of patient's allergies indicates:  Allergies   Allergen Reactions   . Infliximab Anaphylaxis   . Codeine Other     "Feels like her skin is crawling"       Past Medical History:  Past Medical History:   Diagnosis Date   . Rheumatoid arthritis [facility])        Surgical History:  Past Surgical History:   Procedure Laterality Date   . UNLISTED PROCEDURE FEMUR/KNEE     . UNLISTED PROCEDURE FOOT/TOES         Family History:  Family History     Problem (# of Occurrences) Relation (Name,Age of Onset)    Cancer (1) Father    Rheumatoid Arthritis  (1) Other          Social History:  Social History     Socioeconomic History   . Marital status: Single     Spouse name: Not on file   . Number of children: Not on file   . Years of education: Not on file   . Highest education level: Not on file   Occupational History   . Not on file   Social Needs   . Financial resource strain: Not on file   . Food insecurity     Worry: Not on file     Inability: Not on file   . Transportation needs     Medical: Not on file     Non-medical: Not on file   Tobacco Use   . Smoking status: Former Smoker     Packs/day: 0.00   Substance and Sexual Activity   .  Alcohol use: Not Currently   . Drug use: Never   . Sexual activity: Not on file   Lifestyle   . Physical activity     Days per week: Not on file     Minutes per session: Not on file   . Stress: Not on file   Relationships   . Social Product manager on phone: Not on file     Gets together: Not on file     Attends religious service: Not on file     Active member of club or organization: Not on file     Attends meetings of clubs or organizations: Not on file     Relationship status: Not on file   . Intimate partner violence     Fear of current or ex partner: Not on file     Emotionally abused: Not on file     Physically abused: Not on file     Forced sexual activity: Not on file   Other Topics Concern   . Not on file   Social History Narrative    She moved to [place] from [place] in 04/2018.          PHYSICAL EXAMINATION:  Vital signs:  There were no vitals taken for this visit.  General:   Skin:   HEENT:   Lungs:   Heart:   Abd:   Ext:   Neuro:   Musculoskeletal:   Hands:   Right hand   tenderness in MCP1 -, MCP2 -, MCP3 -, MCP4 -, MCP5 -, PIP1 -, PIP2 -, PIP3 -, PIP4 -, PIP5 -.   swelling in MCP1 -, MCP2 -, MCP3 -, MCP4 -, MCP5 -, PIP1 -, PIP2 -, PIP3 -, PIP4 -,. PIP5 -.  Left hand   tenderness in MCP1 -, MCP2 -, MCP3 -, MCP4 -, MCP5 -, PIP1 -, PIP2 -, PIP3 -, PIP4 -, PIP5 -.   swelling in MCP1 -, MCP2 -, MCP3 -,  MCP4 -, MCP5 -, PIP1 -, PIP2 -, PIP3 -, PIP4 -,. PIP5 -.  Wrists:   Right wrist tenderness -, swelling -, full ROM  Left wrist tenderness -, swelling -, full ROM  Tinel's sign - in both wrists  Elbows:   Right elbow tenderness -, swelling -, full ROM  Left elbow tenderness -, swelling -, full ROM  Tenderness in lateral epicondyle - in both elbows  Shoulders:   Right shoulder tenderness -, swelling -, full ROM  Left shoulder tenderness -, swelling -, full ROM  Hips:   Right hip pain with ROM -  Left hip pain with ROM -  Tenderness in greater trochanter - in both hips  Knees:   Right knee tenderness -, swelling -, full ROM  Left knee tenderness -, swelling -, full ROM  Ankles:   Right ankle tenderness -, swelling -, full ROM  Left ankle tenderness -, swelling -, full ROM  Feet:   Right foot   tenderness in MTP1 -, MTP2 -, MTP3 -, MTP4 -, MTP5 -, PIP1 -, PIP2 -, PIP3 -, PIP4 -. PIP5 -.   swelling in MTP1 -, MTP2 -, MTP3 -, MTP4 -, MTP5 -, PIP1 -, PIP2 -, PIP3 -, PIP4 -. PIP5 -.  Left foot   tenderness in MTP1 -, MTP2 -, MTP3 -, MTP4 -, MTP5 -, PIP1 -, PIP2 -, PIP3 -, PIP4 -. PIP5 -.  swelling in MTP1 -, MTP2 -, MTP3 -, MTP4 -, MTP5 -, PIP1 -, PIP2 -,  PIP3 -, PIP4 -. PIP5 -.  C-spine:   Tenderness - in spinous process or paraspinal muscles, full ROM  L-Spine:   Tenderness - in spinous process or paraspinal muscles, full ROM, tenderness - in both SI joints, FABER test - bilaterally, straight leg raising test - bilaterally  Fibromyalgia tender points:   Tenderness - over multiple fibromyalgia tender points including anterior neck, second rib, occiput, trapezius, supraspinatus      IMAGING/DIAGNOSTIC RESULTS:     None    LABS:  Results for orders placed or performed in visit on 10/07/18   ANA Reflex Comprehensive Panel   Result Value Ref Range    Antibodies to Nuclear Ags by IF (ANA) Positive by IFA. (A) NRN    ANA Pattern by IF Atypical speckled pattern positive at 1:80.     Ana Screen By Multiplex Negative NRN    Ana  Interpretation Comment 1       ANA screen positive. Antibody identification panel ordered.   Anti C. Citrullinated Peptide2   Result Value Ref Range    Anti CCP2 Level >300.0 (H) 0.0 - 1.5 U/mL    Anti CCP2 Comment Strong positive    CBC with Differential   Result Value Ref Range    WBC 5.00 4.3 - 10.0 10*3/uL    RBC 4.64 3.80 - 5.00 10*6/uL    Hemoglobin 13.5 11.5 - 15.5 g/dL    Hematocrit 43 36 - 45 %    MCV 92 81 - 98 fL    MCH 29.1 27.3 - 33.6 pg    MCHC 31.6 (L) 32.2 - 36.5 g/dL    Platelet Count 232 150 - 400 10*3/uL    RDW-CV 12.8 11.6 - 14.4 %    % Neutrophils 70 %    % Lymphocytes 23 %    % Monocytes 7 %    % Eosinophils 0 %    % Basophils 0 %    % Immature Granulocytes 0 %    Neutrophils 3.46 1.80 - 7.00 10*3/uL    Absolute Lymphocyte Count 1.17 1.00 - 4.80 10*3/uL    Monocytes 0.34 0.00 - 0.80 10*3/uL    Absolute Eosinophil Count 0.00 0.00 - 0.50 10*3/uL    Basophils 0.02 0.00 - 0.20 10*3/uL    Immature Granulocytes 0.01 0.00 - 0.05 10*3/uL    Nucleated RBC 0.00 0.00 10*3/uL    % Nucleated RBC 0 %   Comprehensive Metabolic Panel   Result Value Ref Range    Sodium 141 135 - 145 meq/L    Potassium 4.1 3.6 - 5.2 meq/L    Chloride 106 98 - 108 meq/L    Carbon Dioxide, Total 27 22 - 32 meq/L    Anion Gap 8 4 - 12    Glucose 76 62 - 125 mg/dL    Urea Nitrogen 9 8 - 21 mg/dL    Creatinine 0.56 0.38 - 1.02 mg/dL    Protein (Total) 6.7 6.0 - 8.2 g/dL    Albumin 4.1 3.5 - 5.2 g/dL    Bilirubin (Total) 0.4 0.2 - 1.3 mg/dL    Calcium 9.2 8.9 - 10.2 mg/dL    AST (GOT) 18 9 - 38 U/L    Alkaline Phosphatase (Total) 93 34 - 121 U/L    ALT (GPT) 14 7 - 33 U/L    GFR, Calc, European American >60 >59 mL/min/[1.73_m2]    GFR, Calc, African American >60 >59 mL/min/[1.73_m2]    GFR, Information  Calculated GFR in mL/min/1.73 m2 by MDRD equation.  Inaccurate with changing renal function.  See http://depts.[email or web address].html   CRP, high sensitivity   Result Value Ref Range    C_Reactive Protein 6.0  0.0 - 10.0 mg/L   RBC Sedimentation Rate   Result Value Ref Range    Erythrocyte Sedimentation Rate 41 (H) 0 - 20 mm/h   Rheumatoid Factor   Result Value Ref Range    Rheumatoid Factor 668 (H) <15 [IU]/mL   Hepatitis B Battery (HBSAG w/Rflx PCR, Anti-HBS, Anit-HBC)   Result Value Ref Range    Hepatitis B S Ag w/Rflx PCR Nonreactive NREAC    Hepatitis B Surface Ab Nonreactive     Hepatitis B Surf Antibody Intl Units <8.00 m[IU]/mL    Hepatitis B Core Ab Nonreactive NREAC    Hepatitis B Battery Interp       No evidence of past or present Hepatitis B infection.   Hepatitis C Ab with Reflex PCR   Result Value Ref Range    Hepatitis C Antibody w/Rflx PCR Nonreactive NREAC    Hepatitis C Antibody Interpretation       No evidence of antibody to hepatitis C virus (HCV).  Anti-HCV may develop slowly (6 weeks - 6 months) over the course of hepatitis C virus infection.  Rarely it may be absent in cases of chronic   infection.  Anti-HCV may also disappear after recovery from acute, self-limited disease.     Quantiferon-TB Gold Plus   Result Value Ref Range    Quantiferon Nil Result 0.04 IU gamma IF/mL    Quantiferon TB1 Ag minus Nil Result 0.00 IU gamma IF/mL    Quantiferon TB2 Ag minus Nil Result 0.00 IU gamma IF/mL    Quantiferon TB Mitogen minus Nil Result >10.00 IU gamma IF/mL    Quantiferon TB Interpretation Negative NRN   ANA Reflex Id Panel Bty   Result Value Ref Range    Anti dsDNA (EIA) 2 0 - 14 U/mL    Anti Chromatin Negative NRN    Anti Ribosomal P Negative NRN    Anti Sm Negative NRN    Anti Sm/RNP Negative NRN    Anti RNP Negative NRN    Anti SSA/Ro Negative NRN    Anti SSB/La Negative NRN    Anti Centromere B Negative NRN    Anti Scl 70 Antibody Negative NRN    Anti Jo1 Negative NRN    ANA Interpretation Comment 2       The specific autoantibodies tested in the confirmatory panel are negative.       IMPRESSION/RECOMMEDATIONS:    Pateint is a 53 year old female with a history of *** who came in today for an  evaluation of ***. She reported ***. Exam showed tenderness and swelling in joints.    I discussed it is possible that her symptoms may be related to and ordered surveillance lab tests. I counseled her on treatment options. I advised her to switch from ***  to *** . Pertinent information on new medicine(s) were reviewed.      .        Assessment:  1.     PLAN:  The following was discussed with the patient.  Recommendations are as follows:  1.     There are no diagnoses linked to this encounter.      Follow up: ***      01/06/2019 @ *** - I, Bernell List, Medical Scribe acted as a Education administrator and documented  the service/procedure performed to the best of my knowledge in the presence of Julaine Hua, MD who will provide the final review and authentication.  Signed: Bernell List, Medical Scribe

## 2019-01-06 ENCOUNTER — Ambulatory Visit
Admit: 2019-01-06 | Discharge: 2019-01-06 | Disposition: A | Discharge status: Home / Self Care | Payer: No Typology Code available for payment source | Attending: Rheumatology | Admitting: Rheumatology

## 2019-01-06 ENCOUNTER — Telehealth (HOSPITAL_BASED_OUTPATIENT_CLINIC_OR_DEPARTMENT_OTHER): Payer: No Typology Code available for payment source | Admitting: Rheumatology

## 2019-01-06 DIAGNOSIS — M48061 Spinal stenosis, lumbar region without neurogenic claudication: Secondary | ICD-10-CM

## 2019-01-06 DIAGNOSIS — M0579 Rheumatoid arthritis with rheumatoid factor of multiple sites without organ or systems involvement: Secondary | ICD-10-CM

## 2019-01-06 NOTE — Telephone Encounter (Addendum)
[facility]     Rheumatology      Date of service: 01/06/2019     START TIME: 4:05 PM  STOP TIME: 4:19 PM    TOTAL TIME: 14 minutes      PRIMARY CARE PROVIDER:  None PCP  Identifies Patients Without A Pcp Or Unassigned     PATIENT: Autumn Camacho    MRN: [id number]    DOB: 09-07-1966    __________________________________    CHIEF COMPLAINT: Autumn Camacho is a 53 year old female being evaluated today for rheumatoid arthritis. I have elected to perform this telephone encounter in lieu of the patient's scheduled in-person encounter with my clinic due to the current COVID-19 outbreak and the risk that an in-person visit would pose to the patient, clinic staff, or the general public.      RHEUMATOLOGIC HISTORY:  Autumn Camacho was last seen by me on 10/07/2018 for an initial visit. She was diagnosed with rheumatoid arthritis in 1999. She has been treated with methotrexate, Enbrel, Remicade, Humira, Orencia, and Xeljanz. She was started on Rinvoq in 09/2018.      INTERVAL HISTORY/HPI:  Since her last visit with me, she has been taking Rinvoq 15mg  daily and feels that this has been helpful for her arthritic symptoms, though she started experiencing benefit after 2 months. The pain in her hands and elbows have improved, but reports pain in her lower back and left hip. She has some morning stiffness primarily in her back.      She feels that Rinvoq has provided more benefit than [place]. She reports no side effects from Rinvoq except some hair loss. She also takes leflunomide 20mg  daily. She takes meloxicam 15mg  daily as needed for pain management.      She was diagnosed with spinal stenosis and received spine injections when she live in [place].       CURRENT PROBLEM LIST:  There are no active problems to display for this patient.       MEDICATION ALLERGIES and ADVERSE REACTIONS:  Allergies as of 01/06/2019 - Reviewed 10/28/2018   Allergen Reaction Noted   . Infliximab Anaphylaxis  08/17/2018   . Codeine Other 08/17/2018       MEDICATIONS:  No outpatient medications have been marked as taking for the 01/06/19 encounter (Telephone) with Julaine Hua, MD.       MEDICAL HISTORY:  Past Medical History:   Diagnosis Date   . Rheumatoid arthritis ([place])        SURGICAL HISTORY:  Past Surgical History:   Procedure Laterality Date   . UNLISTED PROCEDURE FEMUR/KNEE     . UNLISTED PROCEDURE FOOT/TOES         IMMUNIZATION HISTORY:    There is no immunization history on file for this patient.    ALLERGIES: Infliximab; Codeine    SOCIAL HISTORY:  reports that she has quit smoking. She smoked 0.00 packs per day. She does not have any smokeless tobacco history on file. She reports previous alcohol use. She reports that she does not use drugs.    FAMILY HISTORY: family history includes Cancer in her father; Rheumatoid Arthritis in an other family member.    PHYSICAL EXAMINATION:  Vitals: @VS @    Wt Readings from Last 10 Encounters:   10/07/18 165 lb 11.2 oz (75.2 kg)     BP Readings from Last 10 Encounters:   10/07/18 104/71     @MULTIPLEVITALS (10)@    LABORATORY:      IMAGING:  OTHER DATA:      ASSESSMENT and PLAN:  Patient is a 53 year old caucasian female with a history of bilateral knee replacements who I contacted today for a phone visit for an evaluation of rheumatoid arthritis. She reported improved hand and elbow pain on Rinvoq without any side effects. However, she continued to have pain in her back and left hip, and morning stiffness in her lower back. She also endorsed hair loss.      I discussed that the patient's condition appeared to be responsive to medication and ordered additional labs for further work-up. I counseled her on treatment options. I advised her to hold leflunomide to see if symptoms remain stable. I also discussed that meloxicam can contribute to stomach upset.     Regarding her chronic low back pain, I provided referrals to both pain management and physical therapy.        Assessment:  1. Seropositive rheumatoid arthritis   2. Spinal stenosis of lumbar region    PLAN:  1. Lab(s) ordered as listed below. Patient encouraged to get these done at a clinic nearby.   2. Continue Rinvoq 15mg  po qd.   3. Hold leflunomide 20mg  po qd.  4. Continue meloxicam 15mg  po qd prn.   5. Referral to pain clinic placed today for low back pain.   6. Referral to physical therapy for low back placed today.     - Comprehensive Metabolic Panel  - CBC with Differential  - CRP, high sensitivity  - RBC Sedimentation Rate        Follow up: 3 months      01/06/2019 @ 4:25 PM - I, Bernell List, Medical Scribe acted as a Education administrator and documented the service/procedure performed to the best of my knowledge in the presence of Julaine Hua, MD who will provide the final review and authentication.  Signed: Bernell List, Medical Scribe

## 2019-01-06 NOTE — Telephone Encounter (Signed)
I, Kwanghoon Han, MD, personally performed the services as described in this documentation. All medical record entries made by the scribe were at my direction and in my presence. I have reviewed the chart and discharge instructions and agree that the record reflects my personal performance and is accurate and complete.

## 2019-01-06 NOTE — Telephone Encounter (Signed)
3.20.2020 PT referral printed and mailed to this patient.

## 2019-01-12 ENCOUNTER — Other Ambulatory Visit (HOSPITAL_BASED_OUTPATIENT_CLINIC_OR_DEPARTMENT_OTHER): Payer: Self-pay | Admitting: Rheumatology

## 2019-01-12 DIAGNOSIS — M069 Rheumatoid arthritis, unspecified: Secondary | ICD-10-CM

## 2019-01-12 MED ORDER — RINVOQ 15 MG OR TB24
15.0000 mg | EXTENDED_RELEASE_TABLET | Freq: Every day | ORAL | 0 refills | Status: DC
Start: 2019-01-12 — End: 2019-01-16

## 2019-01-15 ENCOUNTER — Other Ambulatory Visit (HOSPITAL_BASED_OUTPATIENT_CLINIC_OR_DEPARTMENT_OTHER): Payer: Self-pay | Admitting: Rheumatology

## 2019-01-15 DIAGNOSIS — M069 Rheumatoid arthritis, unspecified: Secondary | ICD-10-CM

## 2019-01-16 MED ORDER — RINVOQ 15 MG OR TB24
15.0000 mg | EXTENDED_RELEASE_TABLET | Freq: Every day | ORAL | 2 refills | Status: DC
Start: 2019-01-16 — End: 2019-02-22

## 2019-01-16 NOTE — Telephone Encounter (Signed)
Tel visit 01/06/19, per note:

## 2019-01-25 ENCOUNTER — Other Ambulatory Visit (INDEPENDENT_AMBULATORY_CARE_PROVIDER_SITE_OTHER): Payer: MEDICARE

## 2019-01-25 ENCOUNTER — Other Ambulatory Visit
Admit: 2019-01-25 | Discharge: 2019-01-25 | Disposition: A | Discharge status: Home / Self Care | Payer: MEDICARE | Attending: Rheumatology | Admitting: Rheumatology

## 2019-01-25 DIAGNOSIS — M0579 Rheumatoid arthritis with rheumatoid factor of multiple sites without organ or systems involvement: Secondary | ICD-10-CM | POA: Insufficient documentation

## 2019-01-25 LAB — COMPREHENSIVE METABOLIC PANEL
ALT (GPT): 12 U/L (ref 7–33)
AST (GOT): 15 U/L (ref 9–38)
Albumin: 4.3 g/dL (ref 3.5–5.2)
Alkaline Phosphatase (Total): 80 U/L (ref 34–121)
Anion Gap: 7 (ref 4–12)
Bilirubin (Total): 0.3 mg/dL (ref 0.2–1.3)
Calcium: 9.7 mg/dL (ref 8.9–10.2)
Carbon Dioxide, Total: 28 meq/L (ref 22–32)
Chloride: 106 meq/L (ref 98–108)
Creatinine: 0.61 mg/dL (ref 0.38–1.02)
GFR, Calc, African American: >60 mL/min/{1.73_m2} (ref >59)
GFR, Calc, European American: >60 mL/min/{1.73_m2} (ref >59)
Glucose: 65 mg/dL (ref 62–125)
Potassium: 4.7 meq/L (ref 3.6–5.2)
Protein (Total): 6.8 g/dL (ref 6.0–8.2)
Sodium: 141 meq/L (ref 135–145)
Urea Nitrogen: 9 mg/dL (ref 8–21)

## 2019-01-25 LAB — CBC, DIFF
% Basophils: 0 %
% Eosinophils: 0 %
% Immature Granulocytes: 0 %
% Lymphocytes: 37 %
% Monocytes: 8 %
% Neutrophils: 55 %
% Nucleated RBC: 0 %
Absolute Eosinophil Count: 0 10*3/uL (ref 0.00–0.50)
Absolute Lymphocyte Count: 2.08 10*3/uL (ref 1.00–4.80)
Basophils: 0.02 10*3/uL (ref 0.00–0.20)
Hematocrit: 46 % — ABNORMAL HIGH (ref 36–45)
Hemoglobin: 14.5 g/dL (ref 11.5–15.5)
Immature Granulocytes: 0.01 10*3/uL (ref 0.00–0.05)
MCH: 29.5 pg (ref 27.3–33.6)
MCHC: 31.6 g/dL — ABNORMAL LOW (ref 32.2–36.5)
MCV: 94 fL (ref 81–98)
Monocytes: 0.46 10*3/uL (ref 0.00–0.80)
Neutrophils: 3.02 10*3/uL (ref 1.80–7.00)
Nucleated RBC: 0 10*3/uL
Platelet Count: 253 10*3/uL (ref 150–400)
RBC: 4.91 10*6/uL (ref 3.80–5.00)
RDW-CV: 14.3 % (ref 11.6–14.4)
WBC: 5.59 10*3/uL (ref 4.3–10.0)

## 2019-01-25 LAB — C_REACTIVE PROTEIN: C_Reactive Protein: 1.3 mg/L (ref 0.0–10.0)

## 2019-01-25 LAB — SED RATE: Erythrocyte Sedimentation Rate: 20 mm/h (ref 0–20)

## 2019-02-13 ENCOUNTER — Encounter (HOSPITAL_BASED_OUTPATIENT_CLINIC_OR_DEPARTMENT_OTHER): Payer: Self-pay

## 2019-02-14 ENCOUNTER — Encounter (HOSPITAL_BASED_OUTPATIENT_CLINIC_OR_DEPARTMENT_OTHER): Payer: Self-pay

## 2019-02-17 NOTE — Progress Notes (Signed)
Review of Systems   Constitutional: Negative.    HENT: Negative.    Eyes: Negative.         Allergy related     Respiratory: Negative.    Cardiovascular: Negative.    Gastrointestinal: Negative.    Endocrine: Negative.    Genitourinary: Negative.    Musculoskeletal: Positive for gait problem (discomfort in R hip).   Allergic/Immunologic: Negative.    Hematological: Negative.    Psychiatric/Behavioral: Negative.      Reminded patient to complete online Pain Tracker prior to visit. JJC

## 2019-02-20 NOTE — Progress Notes (Signed)
Date PainTracker completed:  02/17/2019    Pain Tracker Scores  ORT Score     -           7/26  AUDIT-C Score -        0/12  PHQ-9 Score  -           4/27  SI Score          -           0/3  GAD-7:           -           1/21  PTSD Score   -           2/4  STOP Score   -           0/4  FM Score       -            6/31    Pain intensity & Interference (lower is better)  Pain intensity              2/10     Pain Interference        6/10    Sleep (lower is better)  Sleep Initiation            4/10  Sleep Maintenance     4/10    ODI & Important Activity Difficulty (lower is better)  ODI                             36/100  Activity Difficulty         3/10    QOL & Satisfaction   Quality of Life                 1/10  Treatment Satisfaction   3/10    Days With Excess Meds (Last Month): none    Important Activity:  standing    Most Bothersome Side Effect: no

## 2019-02-21 ENCOUNTER — Encounter (HOSPITAL_BASED_OUTPATIENT_CLINIC_OR_DEPARTMENT_OTHER): Payer: Self-pay | Admitting: Pain Management

## 2019-02-21 ENCOUNTER — Ambulatory Visit: Payer: MEDICARE | Attending: Pain Management | Admitting: Pain Management

## 2019-02-21 DIAGNOSIS — M06061 Rheumatoid arthritis without rheumatoid factor, right knee: Secondary | ICD-10-CM | POA: Insufficient documentation

## 2019-02-21 DIAGNOSIS — M0609 Rheumatoid arthritis without rheumatoid factor, multiple sites: Secondary | ICD-10-CM

## 2019-02-21 DIAGNOSIS — Z791 Long term (current) use of non-steroidal anti-inflammatories (NSAID): Secondary | ICD-10-CM

## 2019-02-21 DIAGNOSIS — M545 Low back pain: Secondary | ICD-10-CM

## 2019-02-21 DIAGNOSIS — G894 Chronic pain syndrome: Secondary | ICD-10-CM | POA: Insufficient documentation

## 2019-02-21 DIAGNOSIS — M25551 Pain in right hip: Secondary | ICD-10-CM | POA: Insufficient documentation

## 2019-02-21 DIAGNOSIS — G8929 Other chronic pain: Secondary | ICD-10-CM | POA: Insufficient documentation

## 2019-02-21 DIAGNOSIS — M06062 Rheumatoid arthritis without rheumatoid factor, left knee: Secondary | ICD-10-CM | POA: Insufficient documentation

## 2019-02-21 DIAGNOSIS — Z5181 Encounter for therapeutic drug level monitoring: Secondary | ICD-10-CM

## 2019-02-21 DIAGNOSIS — M2550 Pain in unspecified joint: Secondary | ICD-10-CM | POA: Insufficient documentation

## 2019-02-21 DIAGNOSIS — M069 Rheumatoid arthritis, unspecified: Secondary | ICD-10-CM | POA: Insufficient documentation

## 2019-02-21 DIAGNOSIS — Z79899 Other long term (current) drug therapy: Secondary | ICD-10-CM

## 2019-02-21 NOTE — Progress Notes (Signed)
TELEMEDICINE FIRST CONSULTATION    REFERRED BY: Julaine Hua, MD.  REFERRED FOR: Evaluation of multiple chronic pain      CHIEF COMPLAINT(S):  Rheumatoid arthritis for 20 years; knee, hip, hand and elbow pain  Most severe pain is in the right hip and right low back areas    CONTEXT OF THE VISIT  This encounter was provided by the [facility] for [facility] via telemedicine.  The provider, Ardith Dark, MD, was located in the clinic, unaccompanied.  The patient was located at her home and was accompanied by nobody, per patient report.    I have explained to the patient that:  . This method of communication has been selected in the context of the current COVID-19 global health crisis, to minimize travel and interpersonal interactions and preserve the health of patients, providers and our community;  . There are different risks with this method, including concerns regarding confidentiality, which we have worked to create a secure environment for our communication,  and the inability to perform a physical examination;  . The encounter is not being recorded and that permission is not granted for recording.    The patient expressed understanding and agreed.      HISTORY OF PRESENT ILLNESS:  Several year history of chronic pain, in multiple joints, as well as low back pain and right hip radiation; previously lumbar ESI was helpful; about a year ago moved from [facility] inside mostly; previously went to campus and walked where she is studying      PAST HISTORY:  Past Medical History:   Diagnosis Date   . Rheumatoid arthritis ([place])      PMH (From Care Everywhere):    Rheumatoid arthritis involving multiple sites with positive rheumatoid factor 10/03/2016   Osteoarthritis of right hip 10/03/2016   Degenerative arthritis of knee, bilateral          Past Surgical History:   Procedure Laterality Date   . UNLISTED PROCEDURE FEMUR/KNEE     . 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 Hegg has a current medication list which includes the following prescription(s): acetaminophen, meloxicam, multiple vitamins-minerals, rinvoq, and valacyclovir.    Current pain therapies: Meloxicam 15 mg qd; acetaminophen 500 mg qd or bid prn.  Previous pain therapies:   Physical therapy: last time in 2019, for the back     Previous interventional pain treatments: Lumbar ESI with some success  Surgeries related to the pain problem: Bilateral TKR in 2018    Previous imaging:     Right hip X-ray, from 06/27/2017    IMPRESSION:  1. No acute findings. No osseous fracture or dislocation. No  significant degenerative change seen at the right hip joint.  Previous report of 04/29/2016 described mild right hip  osteoarthritis as manifested by mild subchondral sclerosis in the  right acetabulum, with similar appearance today.  2. Mild degenerative  change within the slightly scoliotic lower  lumbar spine.    ALLERGIES:  Infliximab and Codeine    PRESCRIPTION MONITORING PROGRAM  Not all dispense history queries completed successfully. There might be information available that was not received.  Source Status Last Checked for Updates   [place] PDMP (Amb[place]) Patient Not Found 02/21/2019 12:32 AM     [place] Medication Dispense History  No medication dispenses to display (since 02/26/2018). Last query status: Patient Not Found.    PHYSICAL EXAM (via video):  General : A&O X3, in no distress  Psychiatric: Affect was good,  CN: II - XII grossly intact very pleasant  HEENT: Pupils  equal  Respiratiuons : unlabored  Cardiac: NO JVD distention  MSK: Able to ambulate independently    IMPRESSION:   Autumn Camacho is a 53 year old female with chronic multiple joint pain. The primary origin of the pain may be a combination of RA and OA related mechanism.   The possible nature of the pain has been discussed extensively with Ms. Amundson and she had no further questions.      PLAN:  I have discussed with Ms. Bacha the following plan.    Obtaining lumbar MRI report from [facility]     Medication    Salonpas 4% Lidocaine patches     Rehabilitation      PT to consider     Mental health      MBSR referral has ben made     Interventional treatments      Lumbar epidural injection to consider at a later point         Coordination of care    Follow up in 5 to 6 weeks        I thank the referring provider Julaine Hua for involving [place] in the care of this patient.      Time statement  I spent a total time of over 45 minutes via video with the patient, of which more than 50% was spent counseling on pain mechanisms and treatment strategies.Marland Kitchen

## 2019-02-22 ENCOUNTER — Telehealth (HOSPITAL_BASED_OUTPATIENT_CLINIC_OR_DEPARTMENT_OTHER): Payer: Self-pay | Admitting: Rheumatology

## 2019-02-22 DIAGNOSIS — M069 Rheumatoid arthritis, unspecified: Secondary | ICD-10-CM

## 2019-02-22 MED ORDER — RINVOQ 15 MG OR TB24
15.0000 mg | EXTENDED_RELEASE_TABLET | Freq: Every day | ORAL | 2 refills | Status: DC
Start: 2019-02-22 — End: 2019-02-22

## 2019-02-22 MED ORDER — RINVOQ 15 MG OR TB24
15.0000 mg | EXTENDED_RELEASE_TABLET | Freq: Every day | ORAL | 2 refills | Status: DC
Start: 2019-02-22 — End: 2019-04-25

## 2019-02-22 NOTE — Telephone Encounter (Signed)
Patient called to tell us that she is almost out of Rinvoq. Reportedly, [facility] told the patient that they have asked [place] several times for a refill.    I reviewed the 3.30.2020RX for upadacitinib ER (Rinvoq) 15 MG 24 hour tablet , has 2 refills.    I called the pharmacy  direct Ph [phone] - they do not acknowledge the receipt of the 3.30.2020 RX.    I offered to give a verbal, was put on hold, then the call was disconnected.    Since the patient is having difficulty getting her medication, we can re-send this RX.    Please  add a comment on the RX, asking [facility] to expedite the RX.

## 2019-02-22 NOTE — Telephone Encounter (Signed)
New 5.6.2020 RX shows:  E-Prescribing Status:  Transmission to pharmacy failed  (02/22/2019 11:15 AM PDT)     Will ask the [facility] to re-send

## 2019-02-22 NOTE — Telephone Encounter (Signed)
Please call patient back regarding her P/A  refill for RINVOQ.

## 2019-02-25 ENCOUNTER — Ambulatory Visit: Payer: MEDICARE | Attending: Pain Management | Admitting: Pain Management

## 2019-02-25 DIAGNOSIS — M069 Rheumatoid arthritis, unspecified: Secondary | ICD-10-CM

## 2019-02-25 DIAGNOSIS — G8929 Other chronic pain: Secondary | ICD-10-CM | POA: Insufficient documentation

## 2019-02-25 DIAGNOSIS — M25551 Pain in right hip: Secondary | ICD-10-CM | POA: Insufficient documentation

## 2019-02-26 ENCOUNTER — Encounter (HOSPITAL_BASED_OUTPATIENT_CLINIC_OR_DEPARTMENT_OTHER): Payer: Self-pay | Admitting: Pain Management

## 2019-02-26 DIAGNOSIS — M25551 Pain in right hip: Secondary | ICD-10-CM

## 2019-02-26 DIAGNOSIS — M069 Rheumatoid arthritis, unspecified: Secondary | ICD-10-CM | POA: Insufficient documentation

## 2019-02-26 DIAGNOSIS — G8929 Other chronic pain: Secondary | ICD-10-CM | POA: Insufficient documentation

## 2019-02-26 HISTORY — DX: Pain in right hip: M25.551

## 2019-02-26 HISTORY — DX: Other chronic pain: G89.29

## 2019-03-04 ENCOUNTER — Ambulatory Visit: Payer: MEDICARE | Attending: Pain Management | Admitting: Pain Management

## 2019-03-04 DIAGNOSIS — G8929 Other chronic pain: Secondary | ICD-10-CM | POA: Insufficient documentation

## 2019-03-04 DIAGNOSIS — M069 Rheumatoid arthritis, unspecified: Secondary | ICD-10-CM | POA: Insufficient documentation

## 2019-03-04 DIAGNOSIS — M25551 Pain in right hip: Secondary | ICD-10-CM

## 2019-03-10 DIAGNOSIS — M069 Rheumatoid arthritis, unspecified: Secondary | ICD-10-CM | POA: Insufficient documentation

## 2019-03-10 NOTE — Progress Notes (Signed)
RETURN TELEMEDICINE CONSULTATION    Autumn Camacho participated today in the mindfulness based stress reduction program (MBSR) , class # 2. She was unable to attend the first class and is starting today.    CHIEF COMPLAINT(S):    Rheumatoid arthritis for 20 years; knee, hip, hand and elbow pain  Most severe pain is in the right hip and right low back areas    CONTEXT OF THE VISIT  I conducted this encounter from home via secure, live, face-to-face video conference with the patient. Autumn Camacho was located at her home with no one in attendance.    Prior to interview, I have explained to the patient that:  . This method of communication has been selected in the context of the current COVID-19 global health crisis, to minimize travel and interpersonal interactions and preserve the health of patients, providers and our community;  . There are different risks with this method, including concerns regarding confidentiality, which we have worked to create a secure environment for our communication,  and the inability to perform a physical examination;  . The encounter is not being recorded and that permission is not granted for recording.    The patient expressed understanding and agreed.      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 Rheumatoid arthritis ([place])         PMH (From Care Everywhere):    Rheumatoid arthritis involving multiple sites with positive rheumatoid factor 10/03/2016   Osteoarthritis of right hip 10/03/2016   Degenerative arthritis of knee, bilateral        Autumn. Camacho has a current medication list which includes the following prescription(s): acetaminophen,  meloxicam, multiple vitamins-minerals, rinvoq, and valacyclovir.  ALLERGIES:  Infliximab and Codeine      PRESCRIPTION MONITORING PROGRAM  Not all dispense history queries completed successfully. There might be information available that was not received.  Source Status Last Checked for Updates   [place] Patient [place] Found 03/04/2019 12:33 AM     [place]  No medication dispenses to display (since 03/15/2018). Last query status: Patient Not Found.    PHYSICAL EXAM (via video):    General: A&O X3; in no distress  Psychiatric: Affect good; good participation in the class  Respirations: unlabored  MSK: able to ambulate independently    Autumn Camacho participated r]today in the following MBSR activities:    1) Longer breath focused mindfulness meditation    2) Detailed group participants' introductions and relating their intentions for participating in MBSR    3) Large group discussions and sharing of experiences from today's and past week's various mindfulness practices    4) Mindful movements meditation         vIMPRESSION, DISCUSSION and PLAN:     Autumn Camacho, pleasant 53 yo F, participated today in MBSR class # 2 ,  an 8 week program intention of which is to train participants in the skill of mindfulness. By developing mindfulness, our hope is and there is a good evidence for this, that Autumn Camacho will gradually develop ability to  face pain, stress and lives other challenges in a more spacious, compassionate and courageous way, rather than with aversion.  Time statement    Total duration of the class was 2.5 hours; next class # 3 will be next Saturday.

## 2019-03-11 ENCOUNTER — Ambulatory Visit (HOSPITAL_BASED_OUTPATIENT_CLINIC_OR_DEPARTMENT_OTHER): Payer: No Typology Code available for payment source | Admitting: Pain Management

## 2019-03-18 ENCOUNTER — Ambulatory Visit: Payer: MEDICARE | Attending: Pain Management | Admitting: Pain Management

## 2019-03-18 DIAGNOSIS — M25551 Pain in right hip: Secondary | ICD-10-CM | POA: Insufficient documentation

## 2019-03-18 DIAGNOSIS — G8929 Other chronic pain: Secondary | ICD-10-CM | POA: Insufficient documentation

## 2019-03-18 NOTE — Progress Notes (Signed)
RETURN TELEMEDICINE CONSULTATION    Autumn Camacho participated today in the mindfulness based stress reduction program (MBSR) , class # 3. She was unable to attend the first class and is starting today.    CHIEF COMPLAINT(S):    Rheumatoid arthritis for 20 years; knee, hip, hand and elbow pain  Most severe pain is in the right hip and right low back areas    CONTEXT OF THE VISIT  I conducted this encounter from home via secure, live, face-to-face video conference with the patient. Autumn. Camacho was located at her home with no one in attendance.    Prior to interview, I have explained to the patient that:   This method of communication has been selected in the context of the current COVID-19 global health crisis, to minimize travel and interpersonal interactions and preserve the health of patients, providers and our community;   There are different risks with this method, including concerns regarding confidentiality, which we have worked to create a secure environment for our communication, and the inability to perform a physical examination;   The encounter is not being recorded and that permission is not granted for recording.    The patient expressed understanding and agreed.      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 Rheumatoid arthritis ([place])         PMH (From Care Everywhere):    Rheumatoid arthritis involving multiple sites with positive rheumatoid factor 10/03/2016   Osteoarthritis of right hip 10/03/2016   Degenerative arthritis of knee, bilateral        Autumn. Camacho has a current medication list which includes the following  prescription(s): acetaminophen, meloxicam, multiple vitamins-minerals, rinvoq, and valacyclovir.  ALLERGIES:  Infliximab and Codeine      PRESCRIPTION MONITORING PROGRAM  Not all dispense history queries completed successfully. There might be information available that was not received.  Source Status Last Checked [place] Updates   [place] Found 03/04/2019 12:33 AM     [place]  No medication dispenses to display (since 03/15/2018). Last query status: Patient Not Found.    PHYSICAL EXAM (via video):    General: A&O X3; in no distress  Psychiatric: Affect good; good participation in the class  Respirations: unlabored  MSK: able to ambulate independently    Autumn Camacho participated r]today in the following MBSR activities:    1) Longer breath focused mindfulness meditation    2) Detailed group participants' introductions and relating their intentions for participating in MBSR    3) Large group discussions and sharing of experiences from today's and past week's various mindfulness practices    4) Mindful movements meditation         vIMPRESSION, DISCUSSION and PLAN:     Autumn Camacho, pleasant 53 yo F, participated today in MBSR class # 2 ,  an 8 week program intention of which is to train participants in the skill of mindfulness. By developing mindfulness, our hope is and there is a good evidence for this, that Autumn Camacho will  gradually develop ability to face pain, stress and lives other challenges in a more spacious, compassionate and courageous way, rather than with aversion.  Time statement    Total duration of the class was 2.5 hours; next class # 3 will be next Saturday.                   Conversation: Hip Pain  (Newest Message First)      Ardith Dark, MD   03/10/19 4:23 PM     Note      RETURN TELEMEDICINE CONSULTATION    Autumn Camacho participated today in the mindfulness based stress reduction program (MBSR) , class # 2. She was  unable to attend the first class and is starting today.    CHIEF COMPLAINT(S):    Rheumatoid arthritis for 20 years; knee, hip, hand and elbow pain  Most severe pain is in the right hip and right low back areas    CONTEXT OF THE VISIT  I conducted this encounter from home via secure, live, face-to-face video conference with the patient. Autumn. Camacho was located at her home with no one in attendance.    Prior to interview, I have explained to the patient that:   This method of communication has been selected in the context of the current COVID-19 global health crisis, to minimize travel and interpersonal interactions and preserve the health of patients, providers and our community;   There are different risks with this method, including concerns regarding confidentiality, which we have worked to create a secure environment for our communication, and the inability to perform a physical examination;   The encounter is not being recorded and that permission is not granted for recording.    The patient expressed understanding and agreed.      HISTORY OF PRESENT ILLNESS:    From my previous note from Feb 21, 2019    "Several year history of chronic pain, in multiple joints, as well as low back pain and right hip radiation; previously lumbar ESI was helpful; about a year ago moved from [facility] inside mostly; previously went to campus and walked where she is studying"    PAST HISTORY:         Diagnosis Date   . Rheumatoid arthritis ([place])         PMH (From Care Everywhere):    Rheumatoid arthritis involving multiple sites with positive rheumatoid factor 10/03/2016   Osteoarthritis of right hip 10/03/2016   Degenerative arthritis of knee, bilateral        Autumn. Camacho has a current medication list which includes the following prescription(s): acetaminophen, meloxicam, multiple vitamins-minerals, rinvoq, and valacyclovir.  ALLERGIES:  Infliximab and Codeine      PRESCRIPTION MONITORING  PROGRAM  Not all dispense history queries completed successfully. There might be information available that was not received.  Source Status Last [place]ed [place] Updates   [place] 03/04/2019 12:33 AM     [place]  No medication dispenses to display (since 03/15/2018). Last query status: Patient Not Found.    PHYSICAL EXAM (via video):    General: A&O X3; in no distress  Psychiatric: Affect good; good participation in the class  Respirations: unlabored  MSK: able to ambulate independently    Autumn Camacho participated r]today in the following MBSR activities:    1) Longer breath focused mindfulness meditation    2) Small and large group discussions and sharing of experiences  from today's and past week's various mindfulness practices    3) Mindful movements meditation     4) Mindful walking meditation        vIMPRESSION, DISCUSSION and PLAN:     Autumn Camacho, pleasant 53 yo F, participated today in MBSR class # 3 ,  an 8 week program intention of which is to train participants in the skill of mindfulness. By developing mindfulness, our hope is and there is a good evidence for this, that Autumn Camacho will gradually develop ability to face pain, stress and lives other challenges in a more spacious, compassionate and courageous way, rather than with aversion. Home work for the next weel then is: bringing mindful awareness to various daily activities such as: eating/drinking, brushing teeth and showering; as well as conducting pleasant events diary, by savoring even most subtle pleasant events.      Time statement    Total duration of the class was 2.5 hours; next class # 4 will be next Saturday.

## 2019-03-25 ENCOUNTER — Ambulatory Visit: Payer: MEDICARE | Attending: Pain Management | Admitting: Pain Management

## 2019-03-25 DIAGNOSIS — M25551 Pain in right hip: Secondary | ICD-10-CM | POA: Insufficient documentation

## 2019-03-25 DIAGNOSIS — M05751 Rheumatoid arthritis with rheumatoid factor of right hip without organ or systems involvement: Secondary | ICD-10-CM | POA: Insufficient documentation

## 2019-03-25 DIAGNOSIS — G8929 Other chronic pain: Secondary | ICD-10-CM | POA: Insufficient documentation

## 2019-03-25 DIAGNOSIS — M05752 Rheumatoid arthritis with rheumatoid factor of left hip without organ or systems involvement: Secondary | ICD-10-CM | POA: Insufficient documentation

## 2019-04-01 ENCOUNTER — Ambulatory Visit: Payer: MEDICARE | Attending: Pain Management | Admitting: Pain Management

## 2019-04-01 DIAGNOSIS — M25551 Pain in right hip: Secondary | ICD-10-CM

## 2019-04-01 DIAGNOSIS — G8929 Other chronic pain: Secondary | ICD-10-CM | POA: Insufficient documentation

## 2019-04-01 DIAGNOSIS — M059 Rheumatoid arthritis with rheumatoid factor, unspecified: Secondary | ICD-10-CM

## 2019-04-02 NOTE — Progress Notes (Signed)
Return Telemedicine Consultation    [name] participated today in the mindfulness based stress reduction program (MBSR) , class # 5. She was unable to attend the first class and is starting today.    CHIEF COMPLAINT(S):    Rheumatoid arthritis for 20 years; knee, hip, hand and elbow pain  Most severe pain is in the right hip and right low back areas    CONTEXT OF THE VISIT  I conducted this encounter fromhomevia secure, live, face-to-face video conference with the patient. [name] was located at her home with no one in attendance.    Prior to interview, I have explained to the patient that:   This method of communication has been selected in the context of the current COVID-19 global health crisis, to minimize travel and interpersonal interactions and preserve the health of patients, providers and our community;   There are different risks with this method, including concerns regarding confidentiality, which we have worked to create a secure environment for our communication,and the inability to perform a physical examination;   The encounter is not being recorded and that permission is not granted for recording.    The patient expressed understanding and agreed.      HISTORY OF PRESENT ILLNESS:    From my previous note from Feb 21, 2019    "Several year history of chronic pain, in multiple joints, as well as low back pain and right hip radiation; previously lumbar ESI was helpful; about a year ago moved from [facility] inside mostly; previously went to campus and walked where she is studying"    PAST HISTORY:         Diagnosis Date   . Rheumatoid arthritis ([place])         PMH (From Care Everywhere):    Rheumatoid arthritis involving multiple sites with positive rheumatoid factor 10/03/2016   Osteoarthritis of right hip 10/03/2016   Degenerative arthritis of knee, bilateral        Ms. Blackleyhas a current medication list which includes the following prescription(s):  acetaminophen, meloxicam, multiple vitamins-minerals, rinvoq, and valacyclovir.  ALLERGIES:  Infliximab and Codeine      PRESCRIPTION MONITORING PROGRAM  Not all dispense history queries completed successfully. There might be information available that was not received.  Source Status Last [place]ed [place] Updates   [place] 03/04/2019 12:33 AM     [place]  No medication dispenses to display (since 03/15/2018). Last query status: Patient Not Found.    PHYSICAL EXAM (via video):    General: A&O X3; in no distress  Psychiatric: Affect good; good participation in the class  Respirations: unlabored  MSK: able to ambulate independently    [name] participated r]today in the following MBSR activities:    1) Longer breath focused mindfulness meditation     2) Small and large group discussion and sharing of today's and past week's experiences and observations of mindfulness practices      3)  Mindful movements     4) Mindful body scan meditation      vIMPRESSION, DISCUSSION and PLAN:    [name], pleasant 53 yo F, participated today in MBSR class # 5 , an 8 week program intention of which is to train participants in the skill of mindfulness. By developing mindfulness, our hope is and there is a good evidence for this, that [name] will gradually develop ability to face pain, stress and lives other  challenges in a more spacious, compassionate and courageous way, rather than with aversion.    Home then for the next week is: 3 - minute breathing space meditation one to three times a day; bringing mindful awareness to various daily activities: eating/drinking; showering, brushing teeth; inter-personal interactions; also, conducting pleasant events calendar by savoring pleasant events even of a very subtle nature.   Also, noticing people in everyday life that have not been noticed very much previously, and wishing them compassion and well being inwardly;  bringing compassionate mindset to pain and difficult emotions.      Time statement    Total duration of the class was 2.5 hours; next class # 6 will be next Saturday.

## 2019-04-06 ENCOUNTER — Encounter (HOSPITAL_BASED_OUTPATIENT_CLINIC_OR_DEPARTMENT_OTHER): Payer: Self-pay | Admitting: Pain Management

## 2019-04-06 NOTE — Progress Notes (Addendum)
Return Telemedicine Consultation                 [name] participated today in the mindfulness based stress reduction program (MBSR) , class #6. She was unable to attend the first class and is starting today.    CHIEF COMPLAINT(S):    Rheumatoid arthritis for 20 years; knee, hip, hand and elbow pain  Most severe pain is in the right hip and right low back areas    CONTEXT OF THE VISIT  I conducted this encounter fromhomevia secure, live, face-to-face video conference with the patient. [name] was located at her home with no one in attendance.    Prior to interview, I have explained to the patient that:   This method of communication has been selected in the context of the current COVID-19 global health crisis, to minimize travel and interpersonal interactions and preserve the health of patients, providers and our community;   There are different risks with this method, including concerns regarding confidentiality, which we have worked to create a secure environment for our communication,and the inability to perform a physical examination;   The encounter is not being recorded and that permission is not granted for recording.    The patient expressed understanding and agreed.      HISTORY OF PRESENT ILLNESS:    From my previous note from Feb 21, 2019    "Several year history of chronic pain, in multiple joints, as well as low back pain and right hip radiation; previously lumbar ESI was helpful; about a year ago moved from [facility] inside mostly; previously went to campus and walked where she is studying"    PAST HISTORY:         Diagnosis Date   . Rheumatoid arthritis ([place])         PMH (From Care Everywhere):    Rheumatoid arthritis involving multiple sites with positive rheumatoid factor 10/03/2016   Osteoarthritis of right hip 10/03/2016   Degenerative arthritis of knee, bilateral        Ms. Blackleyhas a current medication list which includes the following  prescription(s): acetaminophen, meloxicam, multiple vitamins-minerals, rinvoq, and valacyclovir.  ALLERGIES:  Infliximab and Codeine      PRESCRIPTION MONITORING PROGRAM  Not all dispense history queries completed successfully. There might be information available that was not received.  Source Status Last [place]ed [place] Updates   [place] 03/04/2019 12:33 AM     [place]  No medication dispenses to display (since 03/15/2018). Last query status: Patient Not Found.    PHYSICAL EXAM (via video):    General: A&O X3; in no distress  Psychiatric: Affect good; good participation in the class  Respirations: unlabored  MSK: able to ambulate independently    Ms [place]berta Autumn Camacho participated r]today in the following MBSR activities:    1) Longer breath focused mindfulness meditation    2)Small and large group discussion and sharing of today's and past week's experiences and observations of mindfulness practices    3)Mindful movements    4) Mindfulbody scan meditation      IMPRESSION, DISCUSSION and PLAN:    [name], pleasant 53 yo F, participated today in MBSR class # 6 , an 8 week program intention of which is to train participants in the skill of mindfulness. By developing mindfulness, our hope is and there is a good evidence for this, that [name] will gradually develop ability to face pain,  stress and lives other challenges in a more spacious, compassionate and courageous way, rather than with aversion.    Home then for the next week is: 3 - minute breathing space meditation one to three times a day; bringing mindful awareness to various daily activities: eating/drinking; showering, brushing teeth; inter-personal interactions; also, conducting pleasant events calendar by savoring pleasant events even of a very subtle nature.  Also, noticing people in everyday life that have not been noticed very much previously, and wishing them compassion and well  being inwardly; bringing compassionate mindset to pain and difficult emotions; reviewing everything that has been learned and practiced so far      Time statement    Total duration of the class was 2.5 hours; next class # 7 will be next Saturday.

## 2019-04-07 NOTE — Progress Notes (Signed)
Autumn Autumn Camacho participated today in the mindfulness based stress reduction program (MBSR) , class #7. She was unable to attend the first class and is starting today.    CHIEF COMPLAINT(S):    Rheumatoid arthritis for 20 years; knee, hip, hand and elbow pain  Most severe pain is in the right hip and right low back areas    CONTEXT OF THE VISIT  I conducted this encounter fromhomevia secure, live, face-to-face video conference with the patient. Autumn Camacho was located at her home with no one in attendance.    Prior to interview, I have explained to the patient that:   This method of communication has been selected in the context of the current COVID-19 global health crisis, to minimize travel and interpersonal interactions and preserve the health of patients, providers and our community;   There are different risks with this method, including concerns regarding confidentiality, which we have worked to create a secure environment for our communication,and the inability to perform a physical examination;   The encounter is not being recorded and that permission is not granted for recording.    The patient expressed understanding and agreed.      HISTORY OF PRESENT ILLNESS:    From my previous note from Feb 21, 2019    "Several year history of chronic pain, in multiple joints, as well as low back pain and right hip radiation; previously lumbar ESI was helpful; about a year ago moved from [facility] inside mostly; previously went to campus and walked where she is studying"    PAST HISTORY:         Diagnosis Date   . Rheumatoid arthritis ([place])         PMH (From Care Everywhere):    Rheumatoid arthritis involving multiple sites with positive rheumatoid factor 10/03/2016   Osteoarthritis of right hip 10/03/2016   Degenerative arthritis of knee, bilateral        Autumn. Blackleyhas a current medication list which includes the following prescription(s): acetaminophen, meloxicam,  multiple vitamins-minerals, rinvoq, and valacyclovir.  ALLERGIES:  Infliximab and Codeine      PRESCRIPTION MONITORING PROGRAM  Not all dispense history queries completed successfully. There might be information available that was not received.  Source Status Last [place]ed [place] Updates   [place] Patient [place] 03/04/2019 12:33 AM     [place]  No medication dispenses to display (since 03/15/2018). Last query status: Patient Not Found.    PHYSICAL EXAM (via video):    General: A&O X3; in no distress  Psychiatric: Affect good; good participation in the class  Respirations: unlabored  MSK: able to ambulate independently    Autumn Camacho participated r]today in the following MBSR activities:    1) Longer breath focused mindfulness meditation    2)Small and large group discussion and sharing of today's and past week's experiences and observations of mindfulness practices    3)Mindful movements    4) Mindfulwalking meditation      IMPRESSION, DISCUSSION and PLAN:    Autumn Camacho, pleasant 53 yo F, participated today in MBSR class #7, an 8 week program intention of which is to train participants in the skill of mindfulness. By developing mindfulness, our hope is and there is a good evidence for this, that Autumn Camacho will gradually develop ability to face pain, stress and lives other challenges in a more spacious, compassionate and courageous way, rather than with aversion.    Home then  for the next week is: 3 - minute breathing space meditation one to three times a day; bringing mindful awareness to various daily activities: eating/drinking; showering, brushing teeth; inter-personal interactions; also, conducting pleasant events calendar by savoring pleasant events even of a very subtle nature.  Also, noticing people in everyday life that have not been noticed very much previously, and wishing them compassion and well being inwardly; bringing compassionate mindset  to pain and difficult emotions; reviewing everything that has been learned and practiced so far. Continuing to work with pain and aversion with various skills that have been learned so far.      Time statement    Total duration of the class was 2.5 hours; next class #8will be next Saturday.

## 2019-04-08 ENCOUNTER — Ambulatory Visit: Payer: MEDICARE | Attending: Pain Management | Admitting: Pain Management

## 2019-04-08 DIAGNOSIS — M069 Rheumatoid arthritis, unspecified: Secondary | ICD-10-CM | POA: Insufficient documentation

## 2019-04-08 DIAGNOSIS — G8929 Other chronic pain: Secondary | ICD-10-CM

## 2019-04-08 DIAGNOSIS — M25551 Pain in right hip: Secondary | ICD-10-CM

## 2019-04-09 NOTE — Progress Notes (Signed)
This is a return Telemedicine visit      Ms Luton participated today in the mindfulness based stress reduction program (MBSR) , class #8. She was unable to attend the first class and is starting today.    CHIEF COMPLAINT(S):    Rheumatoid arthritis for 20 years; knee, hip, hand and elbow pain  Most severe pain is in the right hip and right low back areas    CONTEXT OF THE VISIT  I conducted this encounter fromhomevia secure, live, face-to-face video conference with the patient. Ms. Canaday was located at her home with no one in attendance.    Prior to interview, I have explained to the patient that:   This method of communication has been selected in the context of the current COVID-19 global health crisis, to minimize travel and interpersonal interactions and preserve the health of patients, providers and our community;   There are different risks with this method, including concerns regarding confidentiality, which we have worked to create a secure environment for our communication,and the inability to perform a physical examination;   The encounter is not being recorded and that permission is not granted for recording.    The patient expressed understanding and agreed.      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 Rheumatoid arthritis ([place])         PMH (From Care Everywhere):    Rheumatoid arthritis involving multiple sites with positive rheumatoid factor 10/03/2016   Osteoarthritis of right hip 10/03/2016   Degenerative arthritis of knee, bilateral        Ms. Blackleyhas a current medication list which includes the following  prescription(s): acetaminophen, meloxicam, multiple vitamins-minerals, rinvoq, and valacyclovir.  ALLERGIES:  Infliximab and Codeine      PRESCRIPTION MONITORING PROGRAM  Not all dispense history queries completed successfully. There might be information available that was not received.  Source Status Last Checked for Updates   [place] Patient Not Found 03/04/2019 12:33 AM     [place]  No medication dispenses to display (since 03/15/2018). Last query status: Patient Not Found.    PHYSICAL EXAM (via video):    General: A&O X3; in no distress  Psychiatric: Affect good; good participation in the class  Respirations: unlabored  MSK: able to ambulate independently    [name] participated r]today in the following MBSR activities:    1) Longer breath focused mindfulness meditation    2)Small and large group discussion and sharing of today's and past week's experiences and observations of mindfulness practices    3)Longer mindful movements practice    4) Compassion meditation    5) Group discussion how to proceed beyond the 8-week MBSR program      IMPRESSION, DISCUSSION and PLAN:    Ms Catarino, pleasant 53 yo F, participated today in MBSR class #8, an 8 week program intention of which is to train participants in the skill of mindfulness. By developing mindfulness, our hope is and there is a good evidence for this, that Ms Wiedemann will gradually  develop ability to face pain, stress and lives other challenges in a more spacious, compassionate and courageous way, rather than with aversion.    Ms Demeritt has been extremely active participant of our group work and has expressed appreciation of learning above skills throughout the program.      Time statement    Total duration of the class was 2.5 hours; this was 8th and the last class of the 8-week MBSR program

## 2019-04-17 ENCOUNTER — Telehealth (HOSPITAL_BASED_OUTPATIENT_CLINIC_OR_DEPARTMENT_OTHER): Payer: Self-pay | Admitting: Pain Management

## 2019-04-17 NOTE — Telephone Encounter (Signed)
Called and left message regarding patients appointment this Weds. July 1st in clinic with Dr. Doran Clay. Wednesdays are the providers Telemedicine day. We can either convert this appt. To a Telemedicine appt. Or schedule patient in clinic on day other than a Monday.    Olivia Mackie

## 2019-04-18 ENCOUNTER — Encounter (HOSPITAL_BASED_OUTPATIENT_CLINIC_OR_DEPARTMENT_OTHER): Payer: Self-pay

## 2019-04-19 ENCOUNTER — Encounter (HOSPITAL_BASED_OUTPATIENT_CLINIC_OR_DEPARTMENT_OTHER): Payer: MEDICARE | Admitting: Pain Management

## 2019-04-19 ENCOUNTER — Ambulatory Visit: Payer: MEDICARE | Attending: Pain Management | Admitting: Pain Management

## 2019-04-19 ENCOUNTER — Encounter (HOSPITAL_BASED_OUTPATIENT_CLINIC_OR_DEPARTMENT_OTHER): Payer: Self-pay | Admitting: Pain Management

## 2019-04-19 DIAGNOSIS — M0579 Rheumatoid arthritis with rheumatoid factor of multiple sites without organ or systems involvement: Secondary | ICD-10-CM | POA: Insufficient documentation

## 2019-04-19 DIAGNOSIS — M545 Low back pain, unspecified: Secondary | ICD-10-CM

## 2019-04-19 DIAGNOSIS — M25551 Pain in right hip: Secondary | ICD-10-CM | POA: Insufficient documentation

## 2019-04-19 DIAGNOSIS — M792 Neuralgia and neuritis, unspecified: Secondary | ICD-10-CM

## 2019-04-19 DIAGNOSIS — G8929 Other chronic pain: Secondary | ICD-10-CM | POA: Insufficient documentation

## 2019-04-19 MED ORDER — GABAPENTIN 300 MG OR CAPS
ORAL_CAPSULE | ORAL | 2 refills | Status: DC
Start: 2019-04-19 — End: 2019-07-17

## 2019-04-19 NOTE — Progress Notes (Signed)
RETURN TELEMEDICINE CONSULTATION      CHIEF COMPLAINT(S):  No diagnosis found..    CONTEXT OF THE VISIT  I conducted this encounter from home via secure, live, face-to-face video conference with the patient. Autumn Camacho was located at her home with her daughter close by  Prior to interview, I have explained to the patient that:  . This method of communication has been selected in the context of the current COVID-19 global health crisis, to minimize travel and interpersonal interactions and preserve the health of patients, providers and our community;  . There are different risks with this method, including concerns regarding confidentiality, which we have worked to create a secure environment for our communication,  and the inability to perform a physical examination;  . The encounter is not being recorded and that permission is not granted for recording.    The patient expressed understanding and agreed.        PAST HISTORY:  Autumn Camacho has a current medication list which includes the following prescription(s): acetaminophen, meloxicam, multiple vitamins-minerals, rinvoq, and valacyclovir.  ALLERGIES:  Infliximab and Codeine      PRESCRIPTION MONITORING PROGRAM  Not all dispense history queries completed successfully. There might be information available that was not received.  Source Status Last Checked for Updates   [place] Patient [place] Found 04/19/2019  1:12 PM     [place]  No medication dispenses to display (since 04/24/2018). Last query status: Patient Not Found.      She is very pleasant 53 year old female whom we originally saw on 05/05 when she presented with pain in her joints, mostly right hip pain.  Also, low back she has history of rheumatoid arthritis for 20 years.  She moved from [place] a little more than a year ago.  She has a history of bilateral knee replacements in 2018.  She has a history of what appears to be degenerative disk disease in lumbar  spine, and she had two years ago a lumbar ESI with some success.  At that point, she had pain in her right leg as well, radiating pain.  She is seeing a rheumatologist who in fact referred the patient to [place].    She does have a lumbar MRI back from about a year and a half ago, which we do not have and she was supposed to speak with [facility] to ask for a report, but she has not managed to do so yet, but she intends to speak with them in nearest day or two, so that hopefully by our next visit together she will have a report available.  There is an x-ray of her right hip, however, from 2018, which showed no osseous fractures or dislocations.  No significant degenerative changes in the right hip; however previous apparently x-ray in 2017 describes mild hip osteoarthritis with mild subchondral sclerosis in the right acetabulum and that was shown in 2018 as well, so that mild degenerative changes and slightly scoliotic lower lumbar spine, this was the report.    From medication standpoint, she takes meloxicam at this point and she also takes [facility] for her rheumatoid arthritis every day and she thinks that it has been helpful for her other joints, but not right hip as much.    MEDICATIONS AND ALLERGIES:  Reviewed and recorded.    PHYSICAL EXAMINATION (via Zoom):    GENERAL:  She is alert, oriented, and this was done via zoom, in no distress.  PSYCHIATRIC:  Affect is good, pleasant as always.  LUNGS:  Respirations unlabored.  CARDIAC:  No jugular vein distention by inspection.  MUSCULOSKELETAL:  She is able to ambulate independently.     ASSESSMENT AND PLAN:  Autumn Camacho is seen again in the clinic via zoom.  She attended our MBSR class.  She tells [place] that she has benefited very much and is adopting some of the skills that we have been working on throughout 8 weeks of MBSR.  She was very active attendant and participant of the class.    She mostly continues with pain in her right hip and low lumbar  area, but not a radicular pain.    RECOMMENDATIONS:  1.  I will make a referral for [facility].  Hopefully, they can work with her remotely via zoom telemedicine.  2.  I will prescribe gabapentin in low dose.  Hopefully, if there is some radiation from the lumbar spine  into right hip area, it can be helped with gabapentin 300 mg for 1 week and then 300 mg twice a day.  She is reluctant to have higher doses because of concerns with weight gain.  3.  She will continue with some mindfulness skills that she has been so active working on and learning.  4.  We will see each other in 4 weeks in followup via telemedicine.

## 2019-04-19 NOTE — Progress Notes (Signed)
Review of Systems   Constitutional: Negative.    HENT: Negative.    Eyes: Negative.    Respiratory: Negative.    Cardiovascular: Negative.    Gastrointestinal: Negative.    Endocrine: Negative.    Genitourinary: Positive for pelvic pain.   Musculoskeletal: Positive for back pain and gait problem.   Skin: Negative.    Allergic/Immunologic: Negative.    Neurological: Positive for weakness.   Hematological: Negative.    Psychiatric/Behavioral: Negative.

## 2019-04-23 NOTE — Progress Notes (Deleted)
PAIN CLINIC NOTE TELEMEDICINE VISIT    She is very pleasant 53 year old female whom we originally saw on 05/05 when she presented with pain in her joints, mostly right hip pain.  Also, low back she has history of rheumatoid arthritis for 20 years.  She moved from [place] a little more than a year ago.  She has a history of bilateral knee replacements in 2018.  She has a history of what appears to be degenerative disk disease in lumbar spine, and she had two years ago a lumbar ESI with some success.  At that point, she had pain in her right leg as well, radiating pain.  She is seeing a rheumatologist who in fact referred the patient to [place].    She does have a lumbar MRI back from about a year and a half ago, which we do not have and she was supposed to speak with [facility] to ask for a report, but she has not managed to do so yet, but she intends to speak with them in nearest day or two, so that hopefully by our next visit together she will have a report available.  There is an x-ray of her right hip, however, from 2018, which showed no osseous fractures or dislocations.  No significant degenerative changes in the right hip; however previous apparently x-ray in 2017 describes mild hip osteoarthritis with mild subchondral sclerosis in the right acetabulum and that was shown in 2018 as well, so that mild degenerative changes and slightly scoliotic lower lumbar spine, this was the report.    From medication standpoint, she takes meloxicam at this point and she also takes [facility] for her rheumatoid arthritis every day and she thinks that it has been helpful for her other joints, but not right hip as much.    MEDICATIONS AND ALLERGIES:  Reviewed and recorded.    PHYSICAL EXAMINATION:  GENERAL:  She is alert, oriented, and this was done via zoom, in no distress.  PSYCHIATRIC:  Affect is good, pleasant as always.  LUNGS:  Respirations unlabored.  CARDIAC:  No jugular vein distention by  inspection.  MUSCULOSKELETAL:  She is able to ambulate independently.     ASSESSMENT AND PLAN:  Ms. Yerby is seen again in the clinic via zoom.  She attended our MBSR class.  She tells [place] that she has benefited very much and is adopting some of the skills that we have been working on throughout 8 weeks of MBSR.  She was very active attendant and participant of the class.    She mostly continues with pain in her right hip and low lumbar area, but not a radicular pain.    RECOMMENDATIONS:  1.  I will make a referral for [facility].  Hopefully, they can work with her remotely via zoom telemedicine.  2.  I will prescribe gabapentin in low dose.  Hopefully, if there is some radiation from the lumbar spine  into right hip area, it can be helped with gabapentin 300 mg for 1 week and then 300 mg twice a day.  She is reluctant to have higher doses because of concerns with weight gain.  3.  She will continue with some mindfulness skills that she has been so active working on and learning.  4.  We will see each other in 4 weeks in followup via telemedicine.

## 2019-04-24 ENCOUNTER — Encounter (HOSPITAL_BASED_OUTPATIENT_CLINIC_OR_DEPARTMENT_OTHER): Payer: Self-pay | Admitting: Pain Management

## 2019-04-25 ENCOUNTER — Other Ambulatory Visit (HOSPITAL_BASED_OUTPATIENT_CLINIC_OR_DEPARTMENT_OTHER): Payer: Self-pay | Admitting: Rheumatology

## 2019-04-25 ENCOUNTER — Telehealth (HOSPITAL_BASED_OUTPATIENT_CLINIC_OR_DEPARTMENT_OTHER): Payer: Self-pay | Admitting: Pain Management

## 2019-04-25 DIAGNOSIS — M069 Rheumatoid arthritis, unspecified: Secondary | ICD-10-CM

## 2019-04-25 NOTE — Telephone Encounter (Signed)
Left vm for patient to please call us back to schedule a 6 week f/u telemedicine visit with him.

## 2019-04-27 MED ORDER — RINVOQ 15 MG OR TB24
15.0000 mg | EXTENDED_RELEASE_TABLET | Freq: Every day | ORAL | 0 refills | Status: DC
Start: 2019-04-27 — End: 2019-07-17

## 2019-04-27 NOTE — Telephone Encounter (Signed)
Patient last seen on 10/07/2018 and was to return in 3 months.  One refill authorized.  Please schedule follow up visit.

## 2019-04-27 NOTE — Telephone Encounter (Signed)
Lvm for pt to cb and schedule.

## 2019-04-28 ENCOUNTER — Ambulatory Visit (HOSPITAL_BASED_OUTPATIENT_CLINIC_OR_DEPARTMENT_OTHER): Payer: MEDICARE | Attending: Pain Management | Admitting: Rehabilitative and Restorative Service Providers"

## 2019-04-28 DIAGNOSIS — M545 Low back pain, unspecified: Secondary | ICD-10-CM

## 2019-04-28 DIAGNOSIS — M25551 Pain in right hip: Secondary | ICD-10-CM | POA: Insufficient documentation

## 2019-04-28 DIAGNOSIS — G8929 Other chronic pain: Secondary | ICD-10-CM

## 2019-04-28 NOTE — Progress Notes (Signed)
PHYSICAL THERAPY INITIAL PLAN OF CARE            Certification From*: 54/49/20  Certification To*: 07/26/11  Date of Symptom Onset*: 07/07/18  Start of Care Date*: 04/28/19     Progress Note Due 05/28/19    VISITS FROM SOC:   1      INTERPRETER  STATUS (Not needed, Telephonic, In Person):not needed    Referring Provider:  Ardith Dark   Diagnosis:     ICD-10-CM    1. Chronic right hip pain  M25.551     G89.29    2. Chronic low back pain without sciatica, unspecified back pain laterality  M54.5     G89.29         PRECAUTIONS:  None given    Mechanism of Injury: started shortly after TKA revision on the left    Pertinent Medical/Surgical History/Current Medications that may affect progress in PT:  Past Medical History:   Diagnosis Date   . Chronic right hip pain 02/26/2019   . Rheumatoid arthritis ([place])    . Rheumatoid arthritis ([place])    . Rheumatoid arthritis involving multiple sites [facility]) 02/26/2019     Past Surgical History:   Procedure Laterality Date   . UNLISTED PROCEDURE FEMUR/KNEE TKA R/L     . UNLISTED PROCEDURE FOOT/TOES       Imaging per MD note: right hip, however, from 2018, which showed no osseous fractures or dislocations. No significant degenerative changes in the right hip; however previous apparently x-ray in 2017 describes mild hip osteoarthritis with mild subchondral sclerosis in the right acetabulum and that was shown in 2018 as well, so that mild degenerative changes and slightly scoliotic lower lumbar spine, this was the report.  ____________________________________________________________________     Previous Therapy: yes in [place]  Prior Level of Function:  Before onset of the current condition, the patient was able to walk upstairs, walk for exercise          SUBJECTIVE:  Patient's Statement: Autumn Camacho has come to PT with primary issue of anterior right hip pain. The pain is sharp and can cause her to feel like her leg could give way. She feels like the bones are rubbing and it feels  unstable. She is fairly comfortable sitting, the worse pain is with weight bearing. Prior to stay at home orders, she was walking to the gym and swimming or rowing. The swimming did not hurt, the rowing was minimally painful. She uses walking as her primary way of managing the pain from her RA so it is very difficult now that she is limited in this activity.   She has some exercise from previous PT but mainly performs the stretching ( figure 4 hip and thomas stretch off bed).    Patient Goals:to be able to resume walking program without pain, be able to walk upstairs and not feel unstable    OBJECTIVE MEASURES / IMPAIRMENTS / ACTIVITY LIMITATIONS:  Pain: 7-8/10 R anterior hip  R LE is approx 2cm shorter than L s/p TKA where extra cement was placed in Left  Cavus foot with toe deformity  Well healed surgical incision over B knee    MMT: hip flexion, knee extension, flexion all strong and painless  Hip abduction with glute med bias- 3/5, TFL 4/5 ( pain reproduced with glute med testing)  Hip ER- reports this feels good and feels like what she wants to do with her hip  Hip IR- strong    Special Tests:  FADIR -positive for impingement  FABER negative  Scour - negative  Ober negative  Thomas - negative  Hip flexion- min pain  Hip IR - min pain    No restrictions in hip or knee ROM, L knee extension beyond 0    TREATMENT & EDUCATION    Primary Learner: Patient  Topics Taught: role of physical therapy, education on diagnosis and prognosis, purpose of interventions and initial HEP instructions  Challenges Impacting this Teaching: None  Desire and Motivation to Learn: Engaging with education, ask questions  Preferred Learning Style: self-directed practice  Post Education Response:  Demonstrates tasks independently    Patient's learning needs, abilities, preferences and readiness per Initial POC were considered in this session.  Learning verified by teach back.    Evaluation Code: 38466 for 42 minutes.  Clinical Presentation  for Selection of Evaluation Code: Unstable  Clinical Decision Making Complexity for Selection of Eval Code:High          Time in:   1508  Total Treatment Minutes:   42   Total Timed Code Minutes:  0    Home Exercise Program   Side lying hip abd/ER 12x2  Bridging with 5 sec hold 10-12x    a handout was provided describing the exercises in written and picture format    ASSESSMENT:    Patient presents with anterior hip pain consistent with impingement. She has significantly weak hip external and abductors. She does not have range of motion restrictions. Given time constraints full lumbar screen not performed today; however, pain is consistently reproduced with hip provocative maneuvers while spine is stable indicating local tissue involvement.     Factors/barriers that may delay or affect course of care include: pertinent past medical history as listed above, Chronicity or severity and Comorbidities, complications, impairment, the following cultural factors: none.          FALL RISK:    low fall risk based on screening  - LE strength and hip stability will be addressed in treatment       PLAN:   Pain will be addressed at the next plan of care review, and intermittently during daily visits as deemed appropriate by the provider.  The focus of daily visits will be on function.    Therapy Treatment Plan (Frequency/Duration/Other Follow up): Patient will be seen for 1 visit per week  until plan of care reviewed.    Plan for next visit:  Hip strengthening, screen lumbar    To improve impairments and reach functional goals the following interventions will be performed: Patient Education, Therapeutic Exercise [id number]) and Therapeutic Activities [phone])      TREATMENT GOALS  Anticipated Discharge Date 06/29/19  LONG TERM GOALS   (Functional Goals)  Current Level of Function /  Participation Restrictions   Patient will be able to walk upstairs with hip stable and pain not to exceed 2/10 Unstable and fearful on stairs with pain  7-8/10. Instability and give way   Patient will be able to walk 1 mile without pain exceeding 2/10 No longer walking for exercise due to pain        Short Term Goals To be met by MET or UNMET / Date    Patient will be able to perform single bridge 10x  in order to demonstrate improved strength and stability 05/29/19    Patient will be able to demonstrate 4" step up 10x with proper LE alignment 05/29/19  Patient assisted in establishing goals and Patient agrees with plan.    Autumn Camacho, PT  [facility]  [facility]  Phone: [phone]   FAX: [phone]

## 2019-05-10 ENCOUNTER — Ambulatory Visit (HOSPITAL_BASED_OUTPATIENT_CLINIC_OR_DEPARTMENT_OTHER): Payer: MEDICARE | Admitting: Rehabilitative and Restorative Service Providers"

## 2019-05-10 DIAGNOSIS — G8929 Other chronic pain: Secondary | ICD-10-CM

## 2019-05-10 DIAGNOSIS — M545 Low back pain, unspecified: Secondary | ICD-10-CM

## 2019-05-10 NOTE — Progress Notes (Signed)
PHYSICAL THERAPY TREATMENT NOTE  __________________________________________________________________  Certification From*: 40/97/35  Certification To*: 32/99/24  Date of Symptom Onset*: 07/07/18  Start of Care Date*: 04/28/19      VISITS FROM SOC:  2    INTERPRETER  STATUS (Not needed, Telephonic, In Person):not needed    Referring Provider:  Ardith Dark   Diagnosis:     ICD-10-CM    1. Chronic right hip pain  M25.551     G89.29    2. Chronic low back pain without sciatica, unspecified back pain laterality  M54.5     G89.29         PRECAUTIONS:  None given        SUBJECTIVE:  Patient's Statement: Tikesha has done well with her exercise routine. She has not had any sharp episodes of pain recently. She is conscious of tightening her abdominals more.    OBJECTIVE MEASURES/IMPAIRMENTS:      TREATMENT/SKILLED INTERVENTION & EDUCATION:  Intervention 1:   97110 Therapeutic exercise for 40 minutes                    Side lying hip abd/ER 10x, 20 second pulsing 3x R/L  Bridging 10x, with red band pulsing abd/add 20 sec 2x  Bent knee fall out with red band 10x R/L 2 sets  sahrman level one abdominal stabilization 10x  Trial of 1/4" felt in shoe- practice gait. Reports positive response to feeling through right leg with lift       Patient's learning needs, abilities, preferences and readiness per Initial POC were considered in this session.  Learning verified by teach back.    Time in: 0800  Total Treatment Minutes:   40  Total Timed Code Minutes:  [place] (HEP):   Side lying hip abd/ER 12x2, pulsing 30 sec 3x  Bridging with 5 sec hold 10-12x, with red band pulsing 20 sec 3x  Bent knee fall out with red band 10x 2 R/L  sahrman level one abdominal stabilization 10x2    a handout was provided describing the exercises in written and picture format     Assessment / Patient Response to Therapy:   Nilza is having positive response to strengthening program. She had improved trunk stability during  gait with lift. She will need to take 1 to 2 weeks to wean into using lift to allow for tissue adaptation.    PLAN:    Continue per treatment plan of care.  Pain will be addressed at the next plan of care review, and intermittently during daily visits as deemed appropriate by the provider.  The focus of daily visits will be on function    Plan for next visit: prone hip    Vanice Sarah, PT  [facility]  Phone: [phone]   FAX: [phone]

## 2019-05-17 ENCOUNTER — Other Ambulatory Visit (HOSPITAL_BASED_OUTPATIENT_CLINIC_OR_DEPARTMENT_OTHER): Payer: Self-pay | Admitting: Rheumatology

## 2019-05-17 ENCOUNTER — Ambulatory Visit (HOSPITAL_BASED_OUTPATIENT_CLINIC_OR_DEPARTMENT_OTHER): Payer: MEDICARE | Admitting: Rehabilitative and Restorative Service Providers"

## 2019-05-17 DIAGNOSIS — B009 Herpesviral infection, unspecified: Secondary | ICD-10-CM

## 2019-05-17 DIAGNOSIS — M25551 Pain in right hip: Secondary | ICD-10-CM

## 2019-05-17 DIAGNOSIS — G8929 Other chronic pain: Secondary | ICD-10-CM

## 2019-05-17 DIAGNOSIS — M545 Low back pain, unspecified: Secondary | ICD-10-CM

## 2019-05-17 MED ORDER — VALACYCLOVIR HCL 1 G OR TABS
ORAL_TABLET | ORAL | 5 refills | Status: DC
Start: 2019-05-17 — End: 2021-08-14

## 2019-05-17 NOTE — Progress Notes (Signed)
PHYSICAL THERAPY TREATMENT NOTE  __________________________________________________________________  Certification From*: 19/50/93  Certification To*: 26/71/24  Date of Symptom Onset*: 07/07/18  Start of Care Date*: 04/28/19      VISITS FROM SOC:  3    INTERPRETER STATUS (Not needed, Telephonic, In Person):not needed  Referring Provider: Ardith Dark   Diagnosis:    ICD-10-CM    1. Chronic right hip pain  M25.551     G89.29    2. Chronic low back pain without sciatica, unspecified back pain laterality  M54.5     G89.29        PRECAUTIONS:None given        SUBJECTIVE:  Patient's Statement: Autumn Camacho has been able to walk several blocks without hip pain. She even climbed a long flight of stairs. She felt good until the top and she started to feel less stable but no pain. She has been consistent with her home program. She and her husband have challenged each other with core work where they hold abdominal V.    OBJECTIVE MEASURES/IMPAIRMENTS:      TREATMENT/SKILLED INTERVENTION & EDUCATION:  Intervention 1:   97110 Therapeutic exercise for 27 minutes                    Bent knee fall out with red band- 10x- R/L good stability  sahrman level one abdominal stabilization- good form- progress to level 3 fatigue with 5  Prone hip abduction - cue to keep core engaged and glutes recruited during movement ( when actively stabilizing there is not click, otherwise there is audible click from hip during movement)      Patient's learning needs, abilities, preferences and readiness per Initial POC were considered in this session.  Learning verified by teach back.    Time in: 0900  Total Treatment Minutes:   27  Total Timed Code Minutes:  27      HOME EXERCISE PROGRAM (HEP):   Side lying hip abd/ER 12x2, pulsing 30 sec 3x  Bridging with 5 sec hold 10-12x, with red band pulsing 20 sec 3x  Bent knee fall out with red band 10x 2 R/L  sahrman level 3 abdominal stabilization 10x2  Prone hip abduction  a handout was  provided describing the exercises in written and picture format     Assessment / Patient Response to Therapy:   Autumn Camacho does very well with cueing for stabilization. She is doing well with current program.    PLAN:    Continue per treatment plan of care.  Pain will be addressed at the next plan of care review, and intermittently during daily visits as deemed appropriate by the provider.  The focus of daily visits will be on function    Plan for next visit: look at stairs    Vanice Sarah, PT  [facility]  Phone: [phone]   FAX: [phone]

## 2019-05-17 NOTE — Telephone Encounter (Signed)
Please schedule an appt soon.

## 2019-05-18 NOTE — Telephone Encounter (Signed)
Lvm for pt to cb and schedule.

## 2019-05-24 ENCOUNTER — Ambulatory Visit (HOSPITAL_BASED_OUTPATIENT_CLINIC_OR_DEPARTMENT_OTHER): Payer: MEDICARE | Attending: Pain Management | Admitting: Rehabilitative and Restorative Service Providers"

## 2019-05-24 DIAGNOSIS — M545 Low back pain, unspecified: Secondary | ICD-10-CM

## 2019-05-24 DIAGNOSIS — M25551 Pain in right hip: Secondary | ICD-10-CM | POA: Insufficient documentation

## 2019-05-24 DIAGNOSIS — G8929 Other chronic pain: Secondary | ICD-10-CM | POA: Insufficient documentation

## 2019-05-24 NOTE — Progress Notes (Signed)
PHYSICAL THERAPY TREATMENT NOTE  __________________________________________________________________  Certification From*: 84/16/60  Certification To*: 63/01/60  Date of Symptom Onset*: 07/07/18  Start of Care Date*: 04/28/19      VISITS FROM SOC:  4    INTERPRETER STATUS (Not needed, Telephonic, In Person):not needed  Referring Provider: Ardith Dark   Diagnosis:    ICD-10-CM    1. Chronic right hip pain  M25.551     G89.29    2. Chronic low back pain without sciatica, unspecified back pain laterality  M54.5     G89.29        PRECAUTIONS:None given        SUBJECTIVE:  Patient's Statement: Autumn Camacho continues to feel good with her exercises.     OBJECTIVE MEASURES/IMPAIRMENTS:      TREATMENT/SKILLED INTERVENTION & EDUCATION:  Intervention 1:   97110 Therapeutic exercise for 37 minutes                    Stairs- Up: excessive pelvic drop and femoral add/IR R > L with lateral right hip pain  Down: decreased eccentric control with quick transition and excessive femoral add/IR  Standing hip abd isometric against wall R/L - tactile verbal and visual cueing. Tend to have hip snap if not stabilized. Challenged with single leg stance on R   Side step with red band 12 feet R/L x2  Seated hip abd/ER with band 12x  Repeat stairs with cueing for engaging glutes to stabilize - improved control/ more sense of stability      Patient's learning needs, abilities, preferences and readiness per Initial POC were considered in this session.  Learning verified by teach back.    Time in: 0808  Total Treatment Minutes:   37  Total Timed Code Minutes:  [place] (HEP):   Side lying hip abd/ER 12x2, pulsing 30 sec 3x  Bridging with 5 sec hold 10-12x, with red band pulsing 20 sec 3x  Bent knee fall out with red band 10x 2 R/L  sahrman level 3 abdominal stabilization 10x2  Prone hip abduction  Seated hip abd/ER with red band  Side step with red band    a handout was provided describing the exercises in  written and picture format     Assessment / Patient Response to Therapy:   Autumn Camacho will benefit from ongoing focus on strengthening her lateral hip stabilizers. She did not tolerate single leg activity.    PLAN:    Continue per treatment plan of care.  Pain will be addressed at the next plan of care review, and intermittently during daily visits as deemed appropriate by the provider.  The focus of daily visits will be on function    Plan for next visit: go through program    Vanice Sarah, PT  [facility]  Phone: [phone]   FAX: [phone]

## 2019-05-31 ENCOUNTER — Ambulatory Visit (HOSPITAL_BASED_OUTPATIENT_CLINIC_OR_DEPARTMENT_OTHER): Payer: MEDICARE | Admitting: Rehabilitative and Restorative Service Providers"

## 2019-05-31 DIAGNOSIS — M25551 Pain in right hip: Secondary | ICD-10-CM

## 2019-05-31 DIAGNOSIS — M545 Low back pain, unspecified: Secondary | ICD-10-CM

## 2019-05-31 DIAGNOSIS — G8929 Other chronic pain: Secondary | ICD-10-CM

## 2019-05-31 NOTE — Progress Notes (Addendum)
PHYSICAL THERAPY PROGRESS NOTE  __________________________________________________________________  Certification From*: 45/80/99  Certification To*: 83/38/25  Date of Symptom Onset*: 07/07/18  Start of Care Date*: 04/28/19      VISITS FROM SOC:  5    INTERPRETER STATUS (Not needed, Telephonic, In Person):not needed  Referring Provider: Ardith Dark   Diagnosis:    ICD-10-CM    1. Chronic right hip pain  M25.551     G89.29    2. Chronic low back pain without sciatica, unspecified back pain laterality  M54.5     G89.29        PRECAUTIONS:None given        SUBJECTIVE:  Patient's Statement: Autumn Camacho was able to walk one mile last week! She did not have pain, but did find that she had some compensatory movement patterns near the end of her walk. She stopped doing side step with the band because that was irritating her pubic symphysis.    OBJECTIVE MEASURES/IMPAIRMENTS:      TREATMENT/SKILLED INTERVENTION & EDUCATION:  Intervention 1:   97110 Therapeutic exercise for 39 minutes                    Bridging  10x  Single bridging 10x R/L  Side lying hip abd/ER with red band 12xR/L  sahrman level three abdominal stabilization 10x  6" step up with dowel R/L 10x        Patient's learning needs, abilities, preferences and readiness per Initial POC were considered in this session.  Learning verified by teach back.    Time in: 0800  Total Treatment Minutes:   39  Total Timed Code Minutes:  [place] (HEP):   Side lying hip abd/ER with red band 10x2  Bridging B 10x2  Single bridge 10x2  sahrman level 3 abdominal stabilization 10x2    a handout was provided describing the exercises in written and picture format     Assessment / Patient Response to Therapy:   Autumn Camacho is doing well with her current program. She continues to build strength and stability.    PLAN:    Continue per treatment plan of care.  Pain will be addressed at the next plan of care review, and intermittently during daily  visits as deemed appropriate by the provider.  The focus of daily visits will be on function    Plan for next visit: prep for DC  Short Term Goals To be met by MET or UNMET / Date    Patient will be able to perform single bridge 10x  in order to demonstrate improved strength and stability 05/29/19 met   Patient will be able to demonstrate 4" step up 10x with proper LE alignment 05/29/19 Met ( performed 6")         Vanice Sarah, PT  [facility]  Phone: [phone]   FAX: [phone]

## 2019-06-02 ENCOUNTER — Encounter (HOSPITAL_BASED_OUTPATIENT_CLINIC_OR_DEPARTMENT_OTHER): Payer: Self-pay | Admitting: Rheumatology

## 2019-06-02 ENCOUNTER — Ambulatory Visit: Payer: No Typology Code available for payment source | Attending: Rheumatology | Admitting: Rheumatology

## 2019-06-02 VITALS — BP 108/70 | HR 77

## 2019-06-02 DIAGNOSIS — Z79899 Other long term (current) drug therapy: Secondary | ICD-10-CM | POA: Insufficient documentation

## 2019-06-02 DIAGNOSIS — M0579 Rheumatoid arthritis with rheumatoid factor of multiple sites without organ or systems involvement: Secondary | ICD-10-CM | POA: Insufficient documentation

## 2019-06-02 NOTE — Progress Notes (Signed)
I, Kwanghoon Han, MD, personally performed the services as described in this documentation. All medical record entries made by the scribe were at my direction and in my presence. I have reviewed the chart and discharge instructions and agree that the record reflects my personal performance and is accurate and complete.

## 2019-06-02 NOTE — Progress Notes (Signed)
[facility] [place]  [address]  TEL: [phone]  l  FAX: [phone]      06/02/2019    PRIMARY CARE PROVIDER:  None PCP  Identifies Patients Without A Pcp Or Unassigned    CONSULTING PROVIDER:  No ref. provider found              PATIENT: Autumn Camacho    [id number]    Reason for visit:    Rheumatoid arthritis     Last visit:  01/06/2019 via telephone encounter    HPI:    Autumn Camacho is a 53 year old Caucasian female who presents for follow-up visit with a chief complaint of rheumatoid arthritis. Patient was last seen by me on 01/06/2019 via telephone encounter. Autumn Camacho was last seen by me on 10/07/2018 for an initial visit. She was diagnosed with rheumatoid arthritis in 1999. She has been treated with methotrexate, Enbrel, Remicade, Humira, Orencia, Xeljanz and Rinvoq.      INTERVAL HISTORY:  Since her last visit, she reports doing well. 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 She is also taking meloxicam 1 tablet daily for pain. She stopped taking leflunomide in 12/2018 and denies any worsening of her symptoms.     Today, she experiences pain in her elbows, right hip, and right lower back. She notes her right hip pain and back pain have improved significantly with physical therapy. 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 She no longer experiences increased pain in the morning.     She [facility]. Terressa Koyanagi and this revealed no significant findings. Her cholesterol and A1c were normal.     PHYSICAL EXAMINATION:  Vital signs:  Blood pressure 108/70, pulse 77, SpO2 98 %.  Skin:  Skin color, texture, turgor normal. No rashes or concerning lesions on exposed skin.  HEENT:  Normocephalic. No masses, lesions, or abnormalities.    Cardiovascular:  Well-perfused. No clubbing, cyanosis, or edema.   Pulmonary/Chest:  Effort normal.   Abdominal:  Exhibits no distension.   Extremities:  Normal, without deformities, edema, or skin discoloration.   Psychiatric:  Mood, memory, affect, and judgment normal.   Neuro:  Grossly normal to observation, gait normal.  Musculoskeletal:   Hands: No tenderness or swelling, ulnar deviation in MCP joints bilaterally  Wrists: No tenderness or swelling, full ROM, negative Tinel's sign  Elbows: Tenderness in right elbow, deformity in elbows bilaterally  Shoulders: No tenderness or swelling, full ROM  Hips: Pain with ROM in right hip, no tenderness in greater trochanter  Knees: No tenderness or swelling  Ankles: No tenderness or swelling, full ROM    Problem List:  Patient Active Problem List   Diagnosis   . Rheumatoid arthritis involving multiple sites ([place])   . Chronic right hip pain   . Rheumatoid arthritis (H[facility])       Current Medications:   Current Outpatient Medications:   .  acetaminophen 500 MG tablet, Take 2 tablets by mouth as needed., Disp: , Rfl:   .  gabapentin 300 MG capsule, Start from 300 mg in the morning for a week; then, increase to 300 mg twice a day, Disp: 60 capsule, Rfl: 2  .  meloxicam 15 MG tablet, TK 1 T PO QD, Disp: , Rfl:   .  Multiple Vitamins-Minerals (MULTIVITAL OR), Take 1 tablet by mouth daily., Disp: , Rfl:   .  upadacitinib ER (Rinvoq) 15 MG 24 hour tablet, Take 1 tablet (15 mg) by mouth daily., Disp: 90 tablet, Rfl: 0  .  valACYclovir 1 g tablet, TAKE 1 TABLET(1000 MG) BY MOUTH DAILY, Disp: 30 tablet, Rfl: 5    ALLERGIES:   Allergies as of 06/02/2019 - Reviewed 06/02/2019   Allergen Reaction Noted   . Infliximab Anaphylaxis 08/17/2018   . Codeine Other 08/17/2018     Past Medical History:   Past Medical History:   Diagnosis Date   . Chronic right hip pain 02/26/2019   . Rheumatoid arthritis ([place])    . Rheumatoid arthritis ([employer])    . Rheumatoid arthritis involving multiple sites  [facility]) 02/26/2019     Surgical History:   Past Surgical History:   Procedure Laterality Date   . UNLISTED PROCEDURE FEMUR/KNEE     . UNLISTED PROCEDURE FOOT/TOES       Family History:   Her family history includes Cancer in her father; Rheumatoid Arthritis in an other family member.  Social History:   She reports that she has quit smoking. She smoked 0.00 packs per day. She has never used smokeless tobacco. She reports previous alcohol use. She reports that she does not use drugs.     Social History     Social History Narrative    She moved to [place] from [place] in 04/2018.        LABS:   Results for orders placed or performed in visit on 01/06/19   Comprehensive Metabolic Panel   Result Value Ref Range    Sodium 141 135 - 145 meq/L    Potassium 4.7 3.6 - 5.2 meq/L    Chloride 106 98 - 108 meq/L    Carbon Dioxide, Total 28 22 - 32 meq/L    Anion Gap 7 4 - 12    Glucose 65 62 - 125 mg/dL    Urea Nitrogen 9 8 - 21 mg/dL    Creatinine 0.61 0.38 - 1.02 mg/dL    Protein (Total) 6.8 6.0 - 8.2 g/dL    Albumin 4.3 3.5 - 5.2 g/dL    Bilirubin (Total) 0.3 0.2 - 1.3 mg/dL    Calcium 9.7 8.9 - 10.2 mg/dL    AST (GOT) 15 9 - 38 U/L    Alkaline Phosphatase (Total) 80 34 - 121 U/L    ALT (GPT) 12 7 - 33 U/L    GFR, Calc, European American >60 >59 mL/min/[1.73_m2]    GFR, Calc, African American >60 >59 mL/min/[1.73_m2]    GFR, Information       Calculated GFR in mL/min/1.73 m2 by MDRD equation.  Inaccurate with changing renal function.  See http://depts.[email or web address].fr.html   CBC with Differential   Result Value Ref Range    WBC 5.59 4.3 - 10.0 10*3/uL    RBC 4.91 3.80 - 5.00 10*6/uL    Hemoglobin 14.5 11.5 - 15.5 g/dL    Hematocrit 46 (H) 36 - 45 %    MCV 94 81 - 98 fL    MCH 29.5 27.3 - 33.6 pg    MCHC 31.6 (L) 32.2 - 36.5 g/dL    Platelet Count 253 150 - 400 10*3/uL    RDW-CV 14.3 11.6 - 14.4 %    % Neutrophils 55 %    % Lymphocytes 37 %    %  Monocytes 8 %    % Eosinophils 0 %    % Basophils 0 %     % Immature Granulocytes 0 %    Neutrophils 3.02 1.80 - 7.00 10*3/uL    Absolute Lymphocyte Count 2.08 1.00 - 4.80 10*3/uL    Monocytes 0.46 0.00 - 0.80 10*3/uL    Absolute Eosinophil Count 0.00 0.00 - 0.50 10*3/uL    Basophils 0.02 0.00 - 0.20 10*3/uL    Immature Granulocytes 0.01 0.00 - 0.05 10*3/uL    Nucleated RBC 0.00 0.00 10*3/uL    % Nucleated RBC 0 %   CRP, high sensitivity   Result Value Ref Range    C_Reactive Protein 1.3 0.0 - 10.0 mg/L   RBC Sedimentation Rate   Result Value Ref Range    Erythrocyte Sedimentation Rate 20 0 - 20 mm/h       IMAGING/DIAGNOSTIC RESULTS:   None    IMPRESSION/RECOMMENDATIONS:   Autumn Camacho is a 53 year old Caucasian female with history of bilateral knee replacements who came in today for evaluation of rheumatoid arthritis. She reported overall improvement of her joint pain on Rinvoq. She started physical therapy which significantly improved her back and hip pain. Exam today showed tenderness in left elbow.    I discussed that the patient's condition appeared to be responding well to her current treatment. Her labs in 01/2019 showed unremarkable findings. I advised her to maintain the current medications and to continue physical therapy for her back and hip.       Assessment:  1. Rheumatoid arthritis   2. Low back pain    PLAN:  The following was discussed with the patient.  Recommendations are as follows:  1. Continue Rinvoq 15mg  po qd.   2. Continue meloxicam 15mg  po qd prn.       Follow up: 3 months      06/02/2019 @ 3:06 PM - I, Autumn Camacho acted as a Education administrator and documented the service/procedure performed to the best of my knowledge in the presence of Julaine Hua, MD who will provide the final review and authentication.  Signed: Louisa Camacho

## 2019-06-14 ENCOUNTER — Ambulatory Visit (HOSPITAL_BASED_OUTPATIENT_CLINIC_OR_DEPARTMENT_OTHER): Payer: MEDICARE | Admitting: Rehabilitative and Restorative Service Providers"

## 2019-06-14 DIAGNOSIS — M545 Low back pain, unspecified: Secondary | ICD-10-CM

## 2019-06-14 DIAGNOSIS — G8929 Other chronic pain: Secondary | ICD-10-CM

## 2019-06-14 NOTE — Progress Notes (Signed)
PHYSICAL THERAPY DISCHARGE NOTE  __________________________________________________________________  Certification From*: [id number]  Certification To*: [id number]  Date of Symptom Onset*: 07/07/18  Start of Care Date*: 04/28/19      VISITS FROM SOC:  6    INTERPRETER  STATUS (Not needed, Telephonic, In Person):not needed    Referring Provider:  Ardith Dark   Diagnosis:     ICD-10-CM    1. Chronic right hip pain  M25.551     G89.29    2. Chronic low back pain without sciatica, unspecified back pain laterality  M54.5     G89.29            SUBJECTIVE:  Patient's Statement: Autumn Camacho has been doing well. She feels more stable on stairs, but still has difficulty with pain going up. She performs her exercise program daily and she is walking.    OBJECTIVE MEASURES/IMPAIRMENTS:      TREATMENT/SKILLED INTERVENTION & EDUCATION:  Intervention 1:   97110 Therapeutic exercise for 33 minutes                    Bridging 10x  Single bridging 10x - good range  Side lying hip abd/ER - will vary hip angle  sahrman level 3 abdominal stabilization- good ability to hold TA  Stairs- pain with going up. Cue to recruit glutes as if performing bridge- able to demo with improved hip stability and less pain       Patient's learning needs, abilities, preferences and readiness per Initial POC were considered in this session.  Learning verified by teach back.    Time in: 0811  Total Treatment Minutes:   33  Total Timed Code Minutes:  [place] (HEP):   Side lying hip abd/ER with red band 10x2  Bridging B 10x2  Single bridge 10x2  sahrman level 3 abdominal stabilization 10x2      Assessment / Patient Response to Therapy:   Autumn Camacho is doing well with her current program. She is able to be independent at this time.    PLAN:    Discharge with HEP    Anticipated Discharge Date 06/29/19  LONG TERM GOALS   (Functional Goals)  Current Level of Function /  Participation Restrictions   Patient will be able to walk upstairs with  hip stable and pain not to exceed 2/10 Feeling more stable on stairs. Able to walk up with less pain if actively recruit gluteals   Patient will be able to walk 1 mile without pain exceeding 2/10 Has resumed walking for exercise         Vanice Sarah, PT  [facility]  Phone: [phone]   FAX: [phone]

## 2019-07-12 ENCOUNTER — Encounter: Payer: Self-pay | Admitting: Gynecology

## 2019-07-15 ENCOUNTER — Other Ambulatory Visit (HOSPITAL_BASED_OUTPATIENT_CLINIC_OR_DEPARTMENT_OTHER): Payer: Self-pay | Admitting: Rheumatology

## 2019-07-15 DIAGNOSIS — M069 Rheumatoid arthritis, unspecified: Secondary | ICD-10-CM

## 2019-07-17 ENCOUNTER — Telehealth (HOSPITAL_BASED_OUTPATIENT_CLINIC_OR_DEPARTMENT_OTHER): Payer: Self-pay

## 2019-07-17 DIAGNOSIS — M792 Neuralgia and neuritis, unspecified: Secondary | ICD-10-CM

## 2019-07-17 MED ORDER — GABAPENTIN 300 MG OR CAPS
ORAL_CAPSULE | ORAL | 2 refills | Status: DC
Start: 2019-07-17 — End: 2019-10-17

## 2019-07-17 MED ORDER — RINVOQ 15 MG OR TB24
EXTENDED_RELEASE_TABLET | ORAL | 0 refills | Status: DC
Start: 2019-07-17 — End: 2019-07-20

## 2019-07-17 NOTE — Telephone Encounter (Signed)
Requested Medication: gabapentin 300mg  capsule  Requested by: pharmacy    Last refill written: 04/19/2019  Written by: Dr. Doran Clay    Last appointment: 04/19/2019 with Dr. Doran Clay  Scheduled follow up: Visit date not found.      Patient Information    Name   Autumn Camacho [phone]) Identity ID   [id number] Sex   Female DOB   1966-01-05   Medication order tracking    Outpatient Medication Detail    gabapentin 300 MG capsule        Sig: Start from 300 mg in the morning for a week; then, increase to 300 mg twice a day        Sent to pharmacy as: Gabapentin 300 MG Oral Capsule        Class: Normal        Order: [id number]        E-Prescribing Status: Receipt confirmed by pharmacy (04/19/2019 3:12 PM PDT)        Order Cedric Fishman Method   E-Prescribed [3]   [place] [phone] [place] [facility] [place] [phone]   Associated Diagnoses:    Neuropathic pain [M79.2] - Primary       Order Providers    Prescribing Provider Encounter Provider   Ardith Dark, MD Ardith Dark, MD   Medication Detail    Medication Quantity Refills Start End   gabapentin 300 MG capsule 60 capsule 2 04/19/2019    Sig: Comment:   Start from 300 mg in the morning for a week; then, increase to 300 mg twice a day    Admin Instructions:   Start from 300 mg in the morning for a week; then, increase to 300 mg twice a day   Route: PRN Reasons: PRN Comment:        Order Class: Number of Doses: Order #: [id number]  [id number]    NDC: LOT #: MAR COMMENT:        Discontinuing user Discontinue Reason Order End Date Order End Time         Order Information    Date and Time Department Ordering/Authorizing   04/19/2019 3:12 PM [facility] Ardith Dark, MD   Electronically signed by    Ardith Dark

## 2019-07-17 NOTE — Telephone Encounter (Signed)
Next visit 09/04/19

## 2019-07-18 ENCOUNTER — Other Ambulatory Visit (HOSPITAL_BASED_OUTPATIENT_CLINIC_OR_DEPARTMENT_OTHER): Payer: Self-pay | Admitting: Rheumatology

## 2019-07-18 DIAGNOSIS — M069 Rheumatoid arthritis, unspecified: Secondary | ICD-10-CM

## 2019-07-20 MED ORDER — RINVOQ 15 MG OR TB24
EXTENDED_RELEASE_TABLET | ORAL | 0 refills | Status: DC
Start: 2019-07-20 — End: 2019-11-15

## 2019-07-20 NOTE — Telephone Encounter (Signed)
Next visit 09/04/19

## 2019-09-04 ENCOUNTER — Encounter (HOSPITAL_BASED_OUTPATIENT_CLINIC_OR_DEPARTMENT_OTHER): Payer: Self-pay | Admitting: Rheumatology

## 2019-09-04 ENCOUNTER — Ambulatory Visit: Payer: No Typology Code available for payment source | Attending: Rheumatology | Admitting: Rheumatology

## 2019-09-04 VITALS — BP 101/72 | HR 84 | Wt 160.0 lb

## 2019-09-04 DIAGNOSIS — Z79899 Other long term (current) drug therapy: Secondary | ICD-10-CM | POA: Insufficient documentation

## 2019-09-04 DIAGNOSIS — M0579 Rheumatoid arthritis with rheumatoid factor of multiple sites without organ or systems involvement: Secondary | ICD-10-CM | POA: Insufficient documentation

## 2019-09-04 LAB — COMPREHENSIVE METABOLIC PANEL
ALT (GPT): 8 U/L (ref 7–33)
AST (GOT): 13 U/L (ref 9–38)
Albumin: 4 g/dL (ref 3.5–5.2)
Alkaline Phosphatase (Total): 70 U/L (ref 34–121)
Anion Gap: 9 (ref 4–12)
Bilirubin (Total): 0.3 mg/dL (ref 0.2–1.3)
Calcium: 9.2 mg/dL (ref 8.9–10.2)
Carbon Dioxide, Total: 26 meq/L (ref 22–32)
Chloride: 109 meq/L — ABNORMAL HIGH (ref 98–108)
Creatinine: 0.68 mg/dL (ref 0.38–1.02)
Glucose: 58 mg/dL — ABNORMAL LOW (ref 62–125)
Potassium: 4.2 meq/L (ref 3.6–5.2)
Protein (Total): 6.5 g/dL (ref 6.0–8.2)
Sodium: 144 meq/L (ref 135–145)
Urea Nitrogen: 10 mg/dL (ref 8–21)
eGFR by CKD-EPI: >60 mL/min/{1.73_m2} (ref >59)

## 2019-09-04 LAB — LIPID PANEL
Cholesterol (LDL): 89 mg/dL (ref <130)
Cholesterol/HDL Ratio: 3.2
HDL Cholesterol: 51 mg/dL (ref >39)
Non-HDL Cholesterol: 111 mg/dL (ref 0–159)
Total Cholesterol: 162 mg/dL (ref <200)
Triglyceride: 110 mg/dL (ref <150)

## 2019-09-04 LAB — CBC, DIFF
% Basophils: 0 %
% Eosinophils: 0 %
% Immature Granulocytes: 0 %
% Lymphocytes: 31 %
% Monocytes: 6 %
% Neutrophils: 63 %
% Nucleated RBC: 0 %
Absolute Eosinophil Count: 0 10*3/uL (ref 0.00–0.50)
Absolute Lymphocyte Count: 1.69 10*3/uL (ref 1.00–4.80)
Basophils: 0.02 10*3/uL (ref 0.00–0.20)
Hematocrit: 44 % (ref 36–45)
Hemoglobin: 14.6 g/dL (ref 11.5–15.5)
Immature Granulocytes: 0.01 10*3/uL (ref 0.00–0.05)
MCH: 31.3 pg (ref 27.3–33.6)
MCHC: 33.4 g/dL (ref 32.2–36.5)
MCV: 94 fL (ref 81–98)
Monocytes: 0.34 10*3/uL (ref 0.00–0.80)
Neutrophils: 3.33 10*3/uL (ref 1.80–7.00)
Nucleated RBC: 0 10*3/uL
Platelet Count: 272 10*3/uL (ref 150–400)
RBC: 4.66 10*6/uL (ref 3.80–5.00)
RDW-CV: 13.3 % (ref 11.6–14.4)
WBC: 5.39 10*3/uL (ref 4.3–10.0)

## 2019-09-04 LAB — C_REACTIVE PROTEIN: C_Reactive Protein: 0.5 mg/L (ref 0.0–10.0)

## 2019-09-04 LAB — SED RATE: Erythrocyte Sedimentation Rate: 12 mm/h (ref 0–20)

## 2019-09-04 MED ORDER — MELOXICAM 7.5 MG OR TABS
7.5000 mg | ORAL_TABLET | Freq: Every day | ORAL | 1 refills | Status: DC | PRN
Start: 2019-09-04 — End: 2019-11-03

## 2019-09-04 NOTE — Progress Notes (Addendum)
[facility] [place] [facility]  [address] [place]  [address]  TEL: [phone]  l  FAX: [phone]      09/04/2019    PRIMARY CARE PROVIDER:  None PCP  Identifies Patients Without A Pcp Or Unassigned    CONSULTING PROVIDER:  No ref. provider found              PATIENT: Autumn Camacho    [id number]    Reason for visit:    Rheumatoid arthritis     HPI:    Autumn Camacho is a 53 year old Caucasian female who presents for follow-up visit with a chief complaint of rheumatoid arthritis. Patient was last seen by me on 06/02/2019. [name] was last seen by me on 10/07/2018 for an initial visit. She was diagnosed with rheumatoid arthritis in 1999. She has been treated with methotrexate, Enbrel, Remicade, Humira, Orencia, Xeljanz and Rinvoq.      INTERVAL HISTORY:  Patient reports her symptoms are well-controlled on rinvoq. She recalls Autumn Camacho was effective initially and stopped working when she restarted after the surgery. She is also taking meloxicam '15mg'$  daily for pain.      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 She no longer experiences increased pain in the morning. Her right hip pain and back pain have improved significantly with physical therapy. She takes gabapentin for back pain.    PHYSICAL EXAMINATION:  Vital signs:  There were no vitals taken for this visit.  Skin:  Skin color, texture, turgor normal. No rashes or concerning lesions on exposed skin.  HEENT:  Normocephalic. No masses, lesions, or abnormalities.   Cardiovascular:  Well-perfused. No clubbing, cyanosis, or edema.   Pulmonary/Chest:  Effort normal.   Abdominal:  Exhibits no distension.   Extremities:  Normal, without deformities, edema, or skin discoloration.   Psychiatric:  Mood, memory, affect, and judgment normal.   Neuro:  Grossly normal to observation, gait normal.  Musculoskeletal:   Hands: No tenderness or  swelling, ulnar deviation in MCP joints bilaterally  Wrists: No tenderness or swelling, full ROM, negative Tinel's sign  Elbows: Tenderness in right elbow, deformity in elbows bilaterally  Shoulders: No tenderness or swelling, full ROM  Hips: Pain with ROM in right hip, no tenderness in greater trochanter  Knees: No tenderness or swelling  Ankles: No tenderness or swelling, full ROM    Problem List:  Patient Active Problem List   Diagnosis   . Rheumatoid arthritis involving multiple sites ([place])   . Chronic right hip pain   . Rheumatoid arthritis (HCC)       Current Medications:   Current Outpatient Medications:   .  acetaminophen 500 MG tablet, Take 2 tablets by mouth as needed., Disp: , Rfl:   .  gabapentin 300 MG capsule, Start from 300 mg in the morning for a week; then, increase to 300 mg twice a day, Disp: 60 capsule, Rfl: 2  .  meloxicam 15 MG tablet, TK 1 T PO QD, Disp: , Rfl:   .  Multiple Vitamins-Minerals (MULTIVITAL OR), Take 1 tablet by mouth daily., Disp: , Rfl:   .  Rinvoq 15 MG 24 hour tablet, TAKE 1 TABLET DAILY, Disp: 90 tablet, Rfl: 0  .  valACYclovir 1 g tablet, TAKE 1 TABLET(1000 MG) BY MOUTH DAILY, Disp: 30 tablet, Rfl: 5  ALLERGIES:   Allergies as of 09/04/2019 - Reviewed 06/02/2019   Allergen Reaction Noted   . Infliximab Anaphylaxis 08/17/2018   . Codeine Other 08/17/2018     Past Medical History:   Past Medical History:   Diagnosis Date   . Chronic right hip pain 02/26/2019   . Rheumatoid arthritis ([place])    . Rheumatoid arthritis ([place])    . Rheumatoid arthritis involving multiple sites [facility]) 02/26/2019     Surgical History:   Past Surgical History:   Procedure Laterality Date   . UNLISTED PROCEDURE FEMUR/KNEE     . UNLISTED PROCEDURE FOOT/TOES       Family History:   Her family history includes Cancer in her father; Rheumatoid Arthritis in an other family member.  Social History:   She reports that she has quit smoking. She smoked 0.00 packs per day. She has never used smokeless tobacco. She  reports previous alcohol use. She reports that she does not use drugs.     Social History     Social History Narrative    She moved to [place] from [place] in 04/2018.        LABS:   Results for orders placed or performed in visit on 01/06/19   Comprehensive Metabolic Panel   Result Value Ref Range    Sodium 141 135 - 145 meq/L    Potassium 4.7 3.6 - 5.2 meq/L    Chloride 106 98 - 108 meq/L    Carbon Dioxide, Total 28 22 - 32 meq/L    Anion Gap 7 4 - 12    Glucose 65 62 - 125 mg/dL    Urea Nitrogen 9 8 - 21 mg/dL    Creatinine 0.61 0.38 - 1.02 mg/dL    Protein (Total) 6.8 6.0 - 8.2 g/dL    Albumin 4.3 3.5 - 5.2 g/dL    Bilirubin (Total) 0.3 0.2 - 1.3 mg/dL    Calcium 9.7 8.9 - 10.2 mg/dL    AST (GOT) 15 9 - 38 U/L    Alkaline Phosphatase (Total) 80 34 - 121 U/L    ALT (GPT) 12 7 - 33 U/L    GFR, Calc, European American >60 >59 mL/min/[1.73_m2]    GFR, Calc, African American >60 >59 mL/min/[1.73_m2]    GFR, Information       Calculated GFR in mL/min/1.73 m2 by MDRD equation.  Inaccurate with changing renal function.  See [email or web address]   CBC with Differential   Result Value Ref Range    WBC 5.59 4.3 - 10.0 10*3/uL    RBC 4.91 3.80 - 5.00 10*6/uL    Hemoglobin 14.5 11.5 - 15.5 g/dL    Hematocrit 46 (H) 36 - 45 %    MCV 94 81 - 98 fL    MCH 29.5 27.3 - 33.6 pg    MCHC 31.6 (L) 32.2 - 36.5 g/dL    Platelet Count 253 150 - 400 10*3/uL    RDW-CV 14.3 11.6 - 14.4 %    % Neutrophils 55 %    % Lymphocytes 37 %    % Monocytes 8 %    % Eosinophils 0 %    % Basophils 0 %    % Immature Granulocytes 0 %    Neutrophils 3.02 1.80 - 7.00 10*3/uL    Absolute Lymphocyte Count 2.08 1.00 - 4.80 10*3/uL    Monocytes 0.46 0.00 - 0.80 10*3/uL    Absolute Eosinophil Count 0.00 0.00 - 0.50 10*3/uL    Basophils 0.02 0.00 -  0.20 10*3/uL    Immature Granulocytes 0.01 0.00 - 0.05 10*3/uL    Nucleated RBC 0.00 0.00 10*3/uL    % Nucleated RBC 0 %   CRP, high sensitivity   Result Value Ref Range     C_Reactive Protein 1.3 0.0 - 10.0 mg/L   RBC Sedimentation Rate   Result Value Ref Range    Erythrocyte Sedimentation Rate 20 0 - 20 mm/h       IMAGING/DIAGNOSTIC RESULTS:   None    IMPRESSION/RECOMMENDATIONS:   Lucine Bilski is a 53 year old female with history of bilateral knee replacements who came in today for evaluation of rheumatoid arthritis. She reported continued improvement of her joint pain on rinvoq. She denied any side effects. Exam today showed deformity in hands and feet which was not changed from last visit.    I discussed that the patient's condition appeared to be stable and ordered surveillance labs. She'd like to see see hand surgery and podiatry regarding surgically correcting her joint deformity and I placed referrals. I advised her to maintain rinvoq and decrease meloxicam dose to reduce the risks for potential side effects.       Assessment:  1. Rheumatoid arthritis   2. Low back pain    PLAN:  The following was discussed with the patient.  Recommendations are as follows:  1. Continue rinvoq '15mg'$  po qd.   2. Decrease meloxicam to 7.'5mg'$  po qd prn.   3. Check CBC, CMP, ESR, CRP, lipid  4. Refer to orthopedics and podaitry        Follow up: 6 months

## 2019-09-06 ENCOUNTER — Telehealth (HOSPITAL_BASED_OUTPATIENT_CLINIC_OR_DEPARTMENT_OTHER): Payer: Self-pay

## 2019-09-06 NOTE — Telephone Encounter (Signed)
meloxicam 7.5 MG tablet [id number]    Order Details  Dose: 7.5-15 mg Route: Oral Frequency: Daily PRN for arthritis   Dispense Quantity: 60 tablet Refills: 1         Sig: Take 1-2 tablets (7.5-15 mg) by mouth daily as needed for arthritis.        Start Date: 09/04/19 End Date: --   Written Date: 09/04/19 Expiration Date: 09/03/20      Diagnosis Association: Rheumatoid arthritis involving multiple sites with positive rheumatoid factor (M05.79)   Providers    Authorizing Provider: Julaine Hua, MD NPI: [id number]  DEA #: [id number]   Ordering User:  Julaine Hua, [place] [phone] [place] [phone] [id number]   [address], [place] [address]   Phone:  [phone] Fax:  [phone]   DEA #:  [id number]

## 2019-09-07 NOTE — Telephone Encounter (Signed)
Received a PA request from [facility] [place] [facility] [phone] [id number] for Meloxicam 7.5mg . Patient has [facility] and medication was last filled 09/06/19. Next fill is available 09/17/19. No further action taken/needed by [facility] at this time.

## 2019-09-26 ENCOUNTER — Ambulatory Visit: Payer: No Typology Code available for payment source | Attending: Hand Surgery | Admitting: Hand Surgery

## 2019-09-26 ENCOUNTER — Encounter (HOSPITAL_BASED_OUTPATIENT_CLINIC_OR_DEPARTMENT_OTHER): Payer: Self-pay | Admitting: Hand Surgery

## 2019-09-26 ENCOUNTER — Ambulatory Visit (HOSPITAL_BASED_OUTPATIENT_CLINIC_OR_DEPARTMENT_OTHER): Payer: No Typology Code available for payment source

## 2019-09-26 VITALS — BP 101/70 | HR 80 | Temp 98.5°F | Ht 60.43 in | Wt 159.0 lb

## 2019-09-26 DIAGNOSIS — M25521 Pain in right elbow: Secondary | ICD-10-CM

## 2019-09-26 DIAGNOSIS — M79642 Pain in left hand: Secondary | ICD-10-CM

## 2019-09-26 DIAGNOSIS — M19221 Secondary osteoarthritis, right elbow: Secondary | ICD-10-CM

## 2019-09-26 DIAGNOSIS — M85822 Other specified disorders of bone density and structure, left upper arm: Secondary | ICD-10-CM

## 2019-09-26 DIAGNOSIS — M0579 Rheumatoid arthritis with rheumatoid factor of multiple sites without organ or systems involvement: Secondary | ICD-10-CM

## 2019-09-26 DIAGNOSIS — S63071A Subluxation of distal end of right ulna, initial encounter: Secondary | ICD-10-CM

## 2019-09-26 DIAGNOSIS — M19222 Secondary osteoarthritis, left elbow: Secondary | ICD-10-CM | POA: Insufficient documentation

## 2019-09-26 DIAGNOSIS — M19232 Secondary osteoarthritis, left wrist: Secondary | ICD-10-CM

## 2019-09-26 DIAGNOSIS — S63072A Subluxation of distal end of left ulna, initial encounter: Secondary | ICD-10-CM | POA: Insufficient documentation

## 2019-09-26 DIAGNOSIS — M19041 Primary osteoarthritis, right hand: Secondary | ICD-10-CM

## 2019-09-26 DIAGNOSIS — M069 Rheumatoid arthritis, unspecified: Secondary | ICD-10-CM

## 2019-09-26 DIAGNOSIS — M06842 Other specified rheumatoid arthritis, left hand: Secondary | ICD-10-CM | POA: Insufficient documentation

## 2019-09-26 DIAGNOSIS — M85842 Other specified disorders of bone density and structure, left hand: Secondary | ICD-10-CM

## 2019-09-26 DIAGNOSIS — M85841 Other specified disorders of bone density and structure, right hand: Secondary | ICD-10-CM

## 2019-09-26 DIAGNOSIS — M25522 Pain in left elbow: Secondary | ICD-10-CM

## 2019-09-26 DIAGNOSIS — M180 Bilateral primary osteoarthritis of first carpometacarpal joints: Secondary | ICD-10-CM

## 2019-09-26 DIAGNOSIS — M79641 Pain in right hand: Secondary | ICD-10-CM

## 2019-09-26 DIAGNOSIS — M06841 Other specified rheumatoid arthritis, right hand: Secondary | ICD-10-CM

## 2019-09-26 DIAGNOSIS — M25422 Effusion, left elbow: Secondary | ICD-10-CM | POA: Insufficient documentation

## 2019-09-26 DIAGNOSIS — M25421 Effusion, right elbow: Secondary | ICD-10-CM | POA: Insufficient documentation

## 2019-09-26 DIAGNOSIS — M19231 Secondary osteoarthritis, right wrist: Secondary | ICD-10-CM | POA: Insufficient documentation

## 2019-09-26 NOTE — Progress Notes (Signed)
Autumn Camacho is a 53 year old right-hand-dominant student at [employer] who was diagnosed with rheumatoid arthritis in 1999.  The patient has been treated with multiple medications and is currently followed by rheumatology with a medication regimen of meloxicam and Rinvoq.  The patient reports that overall her rheumatoid disease has been relatively well controlled with her medication.  However the patient does have advanced rheumatoid arthritis with constant bilateral elbow pain, wrist pain, and hand dysfunction.  The patient's main concern is her inability to type efficiently and her pinch strength while writing.  She would like to go back to work after finishing her classes and feels that she is limited in the function of her grip and pinch.  While she does have pain of her wrist and elbows with pushing open doors and chopping this is less concerning for her at this time compared to the function of her hand.    She reports that her hand and upper extremity pain is a 6 out of 10 that is aching and sharp and that she has pain and weakness that is made worse with activity and pressure.  She reports that her symptoms are improved with rest and bracing.  She reports her strength in her hands is a 3 out of 10 that is constant.  Her sensation is normal in the median, radial, and ulnar nerve distributions      Past Medical History:  Past Medical History:   Diagnosis Date   . Chronic right hip pain 02/26/2019   . Rheumatoid arthritis ([place])    . Rheumatoid arthritis ([place])    . Rheumatoid arthritis ([place])    . Rheumatoid arthritis involving multiple sites ([place]) 02/26/2019       Medications:  Current Outpatient Medications   Medication Sig Dispense Refill   . acetaminophen 500 MG tablet Take 2 tablets by mouth as needed.     . gabapentin 300 MG capsule Start from 300 mg in the morning for a week; then, increase to 300 mg twice a day 60 capsule 2   . meloxicam 15 MG tablet TK 1 T PO QD     . meloxicam 7.5 MG  tablet Take 1-2 tablets (7.5-15 mg) by mouth daily as needed for arthritis. 60 tablet 1   . Multiple Vitamins-Minerals (MULTIVITAL OR) Take 1 tablet by mouth daily.     . Rinvoq 15 MG 24 hour tablet TAKE 1 TABLET DAILY 90 tablet 0   . valACYclovir 1 g tablet TAKE 1 TABLET(1000 MG) BY MOUTH DAILY 30 tablet 5     No current facility-administered medications for this visit.        Allergies:   Review of patient's allergies indicates:  Allergies   Allergen Reactions   . Infliximab Anaphylaxis   . Codeine Other     "Feels like her skin is crawling"       Past Surgical History:  Past Surgical History:   Procedure Laterality Date   . TONSILLECTOMY ONE-HALF <AGE 83     . UNLISTED PROCEDURE FEMUR/KNEE     . UNLISTED PROCEDURE FOOT/TOES         Social History:   The patient states her problem is not work related.  She has a domestic partner and has 2 children.     Social History     Substance and Sexual Activity   Alcohol Use Not Currently    Comment: remote history of alcohol dependence     Social History  Tobacco Use   Smoking Status Former Smoker   . Packs/day: 0.00   Smokeless Tobacco Never Used       ROS:   Positive for Muscle/Bones:  arthritis. All other systems of the 14 reviewed are negative.     Hand & Upper Extremity Examination    Physical Examination  Gen: Patient is healthy, alert, no distress    Psych: Alert and oriented times 3  Pleasant female. Mood and affect appropriate.    Skin: warm, normal color and sweat patterns.    Vascular: Fingers warm, pink, with brisk capillary refill    Neurologic: Sensation to light touch grossly intact over the median, radial, and ulnar distributions    Musculoskeletal:  Inspection of the bilateral extremities demonstrates tenosynovitis of the elbows wrist and MCP joints.  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  The left wrist has a mild form of this.  The MCP joints are subluxed  ulnarly and volarly.  There is bilateral z deformity of the thumbs.  Crepitus of bilateral elbows and grinding     Right elbow flexion 140, extension 0  Right wrist flexion 65, extension 15  Right forearm supination 75, pronation 80    Left elbow flexion 140, extension 0  Left wrist flexion 75, extension 45   Left forearm supination 80, pronation 80     Grinding of bilateral thumbs with zdeformity and laxity of MCP joint    Studies: X-rays of the bilateral elbows and wrists were reviewed and show advanced RA with evidence of sclerosis and joint space narrowing of bilateral elbows, dislocation of MCPJ ulnarly, advanced radiocarpal arthritis with sclerosis and loss of joint spaces, 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 Advanced RA with dislocation of MCPJ   2. Hyperextension and laxity of thumb MCPJ  3. [place] arthritis     Autumn Camacho is a 53 year old right-hand-dominant student at [employer] who was diagnosed with rheumatoid arthritis in 1999.  The patient has been treated with multiple medications and is currently followed by rheumatology with a medication regimen of meloxicam and Rinvoq.  The patient reports that overall her rheumatoid disease has been relatively well controlled with her medication.  However the patient does have advanced rheumatoid arthritis with constant bilateral elbow pain, wrist pain, and hand dysfunction.  The patient's main concern is her inability to type efficiently and her pinch strength while writing.  She would like to go back to work after finishing her classes and feels that she is limited in the function of her grip and pinch.  While she does have pain of her wrist and elbows with pushing open doors and chopping this is less concerning for her at this time compared to the function of her hand.  Given the patient's primary goals are to improve her dexterity and finger strength we discussed that the patient would benefit from  right index finger, middle finger, ring finger, small finger MCPJ silicone arthroplasties, right thumb trapeziectomy with CMC arthroplasty with LRTI with FCR tendon interposition, and MCP joint arthrodesis.    We discussed the risk benefits alternatives of all the above procedures.  All questions were answered in detail and the patient wishes to proceed with surgery.  However the patient wishes to proceed with surgery once her classes for the winter semester completed.  She will return  to clinic in the spring or summer to schedule her surgery.  All paperwork including consent and yellow packet were filled out today.    Plan:  - OR for right index finger, middle finger, ring finger, small finger MCPJ silicone arthroplasties, right thumb trapeziectomy with CMC arthroplasty with LRTI with FCR tendon interposition, and MCP joint arthrodesis    Amada Jupiter, MD  Hand and Microvascular Surgery Fellow

## 2019-09-27 ENCOUNTER — Telehealth (HOSPITAL_BASED_OUTPATIENT_CLINIC_OR_DEPARTMENT_OTHER): Payer: Self-pay | Admitting: Hand Surgery

## 2019-09-27 NOTE — Telephone Encounter (Signed)
RN spoke with Dr Janan Halter re: recommendations for pt's Rinvoq perioperatively:   Have pt hold Rinvoq 2 weeks prior to surgery, and ok to restart it after first post-op visit if wound is healing appropriately.     RN called pt and discussed the above with her. Pt states she will likely wait until summer for surgery, due to [place] concerns, but will remember the directions.  "I've had surgery before and this was the same for my other Rheum meds, so it makes sense."    Pt will call when she is ready to schedule surgery.

## 2019-10-02 ENCOUNTER — Ambulatory Visit: Payer: No Typology Code available for payment source | Attending: Podiatrist | Admitting: Podiatrist

## 2019-10-02 ENCOUNTER — Encounter (HOSPITAL_BASED_OUTPATIENT_CLINIC_OR_DEPARTMENT_OTHER): Payer: Self-pay | Admitting: Podiatrist

## 2019-10-02 VITALS — BP 92/63 | HR 86 | Temp 98.1°F | Ht 60.43 in | Wt 159.0 lb

## 2019-10-02 DIAGNOSIS — M2061 Acquired deformities of toe(s), unspecified, right foot: Secondary | ICD-10-CM | POA: Insufficient documentation

## 2019-10-02 DIAGNOSIS — M069 Rheumatoid arthritis, unspecified: Secondary | ICD-10-CM | POA: Insufficient documentation

## 2019-10-02 DIAGNOSIS — M79671 Pain in right foot: Secondary | ICD-10-CM | POA: Insufficient documentation

## 2019-10-02 NOTE — Progress Notes (Signed)
53 year old female presents on referral from rheumatology  CC she relates a years long history of problematic foot.  Surgery   in 2009 brought really about a significant improvement.  She did   well until about 2 years ago when she began to have foot pain again.  Dislocation of fifth toes has caused problems wearing shoes.  She  experiences sharp pain ambulatory activity and exercise.  Sx.Relieved   by rest.  Recently she experienced blistering fifth toe.  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  Although every effort is made to edit the content, transcription errors may occur.

## 2019-10-16 ENCOUNTER — Encounter (HOSPITAL_BASED_OUTPATIENT_CLINIC_OR_DEPARTMENT_OTHER): Payer: Self-pay

## 2019-10-16 ENCOUNTER — Telehealth (HOSPITAL_BASED_OUTPATIENT_CLINIC_OR_DEPARTMENT_OTHER): Payer: Self-pay

## 2019-10-16 DIAGNOSIS — M792 Neuralgia and neuritis, unspecified: Secondary | ICD-10-CM

## 2019-10-16 NOTE — Telephone Encounter (Signed)
Requested Medication: gabapentin 300mg   Requested by: patient    Last refill written: 9/285/20  Written by: Doran Clay    Last appointment: 04/24/2019 with Dr. Doran Clay  Scheduled follow up: 10/17/2019 with Dr. Doran Clay    Confirmed patient taking 300mg  twice a day    [place] Medication Dispense History (from 10/21/2018 to 10/16/2019)     Dispensed Days Supply Quantity   GABAPENTIN 300 MG CAPSULE 06/16/2019 30 60 unspecified

## 2019-10-17 ENCOUNTER — Ambulatory Visit: Payer: No Typology Code available for payment source | Attending: Pain Management | Admitting: Pain Management

## 2019-10-17 DIAGNOSIS — M792 Neuralgia and neuritis, unspecified: Secondary | ICD-10-CM | POA: Insufficient documentation

## 2019-10-17 DIAGNOSIS — M069 Rheumatoid arthritis, unspecified: Secondary | ICD-10-CM | POA: Insufficient documentation

## 2019-10-17 DIAGNOSIS — M19041 Primary osteoarthritis, right hand: Secondary | ICD-10-CM | POA: Insufficient documentation

## 2019-10-17 DIAGNOSIS — G8929 Other chronic pain: Secondary | ICD-10-CM | POA: Insufficient documentation

## 2019-10-17 DIAGNOSIS — M25559 Pain in unspecified hip: Secondary | ICD-10-CM | POA: Insufficient documentation

## 2019-10-17 DIAGNOSIS — M180 Bilateral primary osteoarthritis of first carpometacarpal joints: Secondary | ICD-10-CM | POA: Insufficient documentation

## 2019-10-17 DIAGNOSIS — M25551 Pain in right hip: Secondary | ICD-10-CM | POA: Insufficient documentation

## 2019-10-17 MED ORDER — NORTRIPTYLINE HCL 25 MG OR CAPS
ORAL_CAPSULE | ORAL | 2 refills | Status: DC
Start: 2019-10-17 — End: 2019-10-31

## 2019-10-17 MED ORDER — GABAPENTIN 300 MG OR CAPS
ORAL_CAPSULE | ORAL | 2 refills | Status: DC
Start: 2019-10-17 — End: 2020-01-15

## 2019-10-17 NOTE — Progress Notes (Signed)
Review of Systems   Constitutional: Negative.    HENT: Positive for congestion.    Eyes: Negative.    Respiratory: Negative.    Cardiovascular: Negative.    Gastrointestinal: Negative.    Endocrine: Negative.    Genitourinary: Negative.    Musculoskeletal: Positive for arthralgias and back pain.   Skin: Positive for wound (L pinky toe).   Allergic/Immunologic: Negative.    Neurological: Positive for headaches.   Hematological: Negative.    Psychiatric/Behavioral: Negative.      Date PainTracker completed:  10/17/2019    Pain Tracker Scores  ORT Score     -           7/26  AUDIT-C Score -        0/12  PHQ-9 Score  -           4/27  SI Score          -           0/3  GAD-7:           -           1/21  PTSD Score   -           2/4  STOP Score   -           1/4  FM Score       -            6/31    Pain intensity & Interference (lower is better)  Pain intensity              6/10     Pain Interference        8/10    Sleep (lower is better)  Sleep Initiation            4/10  Sleep Maintenance     1/10    ODI & Important Activity Difficulty (lower is better)  ODI                             50/100  Activity Difficulty         8/10    QOL & Satisfaction   Quality of Life                 8/10  Treatment Satisfaction   4/10    Days With Excess Meds (Last Month): none    Important Activity:  walking    Most Bothersome Side Effect: no

## 2019-10-17 NOTE — Progress Notes (Signed)
ATTENDING STATEMENT:    I personally evaluated and examined the patient, and agree with the history, exam findings, impression, and plan outlined in the note by Dr. Gerhard Perches.  Parts of the fellow/resident note may have been edited as necessary for accuracy.    Autumn Camacho is a 53 year old female with RA who presents with bilateral elbow pain as well as worsening hand function from deformity and loss of movement.  She is not able to type effectively and has difficulties pinching with her hand. She has pain in her wrist and elbows as well but the hand dysfunction is the most disabling.    On exam, she has   Elbow ROM 0 to 140 bilaterally  Prominence distal ulna dorsally R wrist with volar subluxation of the carpus  Wrist flexion 65 R vs 75 L  Extension 15 R vs 45 L    Ulnar deviation deformity MCP joints with volar joint subluxation  Z deformities of the thumb with adduction contracture thumb webspace with MCP joint hyperextension with radial deviation/subluxation bilaterally    Radiographs demonstrated joint space narrowing bilateral elbows and sclerosis.  Severe radiocarpal arthritis with erosive changes and volar subluxation bilaterally .  Ulnar positive variance R wrist with dorsal subluxation of the DRUJ.    Radiographs of the hands demonstrated severe MCP joint arthritis with loss of joint space and volar and ulnar subluxation of the joints.  Z-deformity bilateral thumbs with adduction deformity CMC joints and MCP joint hyperextension and radial subluxation.    Treatment options were reviewed with the patient including nonoperative vs. surgical treatment. Patient's main goals are to improve function in her hands. Although her wrists and elbows are painful, she can live with this pain at this time    We recommended    R hand IF, MF, RF and SF MCP joint silicone arthroplasty  Right thumb trapeziectomy with CMC arthroplasty with FCR tendon LRTI  R thumb MCP joint fusion      Loistine Simas,  MD  [facility]  Department of Orthopaedics and Sports Medicine  Hand, Wrist, and Elbow Surgery

## 2019-10-18 NOTE — Progress Notes (Addendum)
RETURN TELEMEDICINE CONSULTATION  CHIEF COMPLAINT(S):  (M25.559,  G89.29) Hip pain, chronic, unspecified laterality  (primary encounter diagnosis)  (M79.2) Neuropathic pain.    CONTEXT OF THE VISIT  I conducted this encounter from the [facility] via secure, live, face-to-face video conference with the patient. Ms. Fiscus was located at her home with.  Prior to interview, I have explained to the patient that:  . This method of communication has been selected in the context of the current COVID-19 global health crisis, to minimize travel and interpersonal interactions and preserve the health of patients, providers and our community;  . There are different risks with this method, including concerns regarding confidentiality, which we have worked to create a secure environment for our communication,  and the inability to perform a physical examination;  . The encounter is not being recorded and that permission is not granted for recording.    The patient expressed understanding and agreed.      PAST HISTORY:  Ms. Lobeck has a current medication list which includes the following prescription(s): acetaminophen, gabapentin, meloxicam, meloxicam, multiple vitamins-minerals, nortriptyline, rinvoq, and valacyclovir.  ALLERGIES:  Infliximab and Codeine      PRESCRIPTION MONITORING PROGRAM  [place]ication [place] History (from 10/27/2018 to 10/17/2019)     Dispensed Days Supply Quantity   GABAPENTIN 300 MG CAPSULE 06/16/2019 30 60 unspecified         Ms. Perrotti is a very pleasant 53 year old female who we have seen in the clinic on multiple previous occasions, originally on 05/05 when she was referred to [place] by her rheumatologist for evaluation of multiple chronic pain problems.  She has a history of rheumatoid arthritis, osteoarthritis, history of back pain and multiple joint pains, specifically hip and elbow and hand joint pain.  Her right hip is especially troublesome.  For back pain, she had a  lumbar epidural steroid injection when she was in [place].  This was helpful, but again when we saw her, she did have multiple joint pain issues.    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  She has been on meloxicam and disease modifying treatment with Rinvoq.  However, we recommended the addition of gabapentin, on which she has been since that time.  Also, we referred the patient to our mindfulness-based stress reduction program, which she attended.  The patient is here today in followup with [place].    From a medication standpoint, she is on gabapentin 300 mg twice a day and meloxicam 15 mg once a day and again Rinvoq for her rheumatoid arthritis.    With sleep, she tells [place] that it is not very easy to find a comfortable position, but when she does, then she speaks sleeps reasonably well.    With movement, she does not move very much, but she does not use any assistive devices at this point.    She has been in physical therapy and she found physical therapy useful.    With completing our mindfulness-based stress reduction program, she uses some of the mindfulness skills and mindfulness meditations and she has a smartphone app downloaded on her phone, and so she does engage with some mindfulness strategies.    From a social standpoint, she is moving to another place.  She has been busy with that, packing and unpacking boxes, but overall she tells [place] that she is doing reasonably well.  She is also  back in school.    MEDICATIONS AND ALLERGIES:  Reviewed and recorded.      Medications are as already mentioned.  It is not a very long list of medications; gabapentin, meloxicam, Rinvoq and valacyclovir.    PHYSICAL EXAMINATION (via Zoom):  GENERAL:  She is alert and oriented, in no distress.  PSYCHIATRIC:  Affect was good.  She was in good spirits.  LUNGS:  Respirations unlabored.  MUSCULOSKELETAL:  She is able to ambulate  independently.    Ms. Suber most recent imaging study that I can find is from 06/2017 of her right hip, and according to the report, there was no osseous fracture or dislocation, no significant degenerative changes seen in the right hip joint; however, previously described in 2017, mild hip osteoarthritis with mild subchondral sclerosis in the right acetabulum has a similar appearance, as they commented.  Mild degenerative changes within the slightly scoliotic lower lumbar spine.  That was a report of her right hip imaging.    Although the patient does tell us that she has the impression as if her right hip is dislocated, but again according to that imaging, although two years old now, there was no dislocation present.    ASSESSMENT AND PLAN:  Ms. Vincenti is a very pleasant 53 year old female who is seen in followup via [place].  She is doing reasonably well, although right hip pain is still a problem.    Gabapentin has been helpful, as it appears mostly for back pain, but not as much for hip pain.    RECOMMENDATIONS:  1.  She will continue with her home exercise program that she was given after her physical therapy was completed.  2.  She will continue with gabapentin.  3.  I will prescribe low dose nortriptyline 25 mg at bedtime, and then if she does well, I will ask our clinical pharmacist to follow up with the patient in 2 weeks, and if she does well, we will recommend an increase of the dose to 50 mg at bedtime.  We will see the patient in followup in 4 weeks.

## 2019-10-22 ENCOUNTER — Encounter (HOSPITAL_BASED_OUTPATIENT_CLINIC_OR_DEPARTMENT_OTHER): Payer: Self-pay | Admitting: Pain Management

## 2019-10-25 ENCOUNTER — Telehealth (HOSPITAL_BASED_OUTPATIENT_CLINIC_OR_DEPARTMENT_OTHER): Payer: Self-pay | Admitting: Pain Management

## 2019-10-25 NOTE — Telephone Encounter (Signed)
Left patient voice mail to call clinic to schedule a Phone appt. With the pharmacist in 1 - 2 wks.

## 2019-10-31 ENCOUNTER — Ambulatory Visit
Payer: No Typology Code available for payment source | Attending: Unknown Physician Specialty | Admitting: Unknown Physician Specialty

## 2019-10-31 DIAGNOSIS — Z79899 Other long term (current) drug therapy: Secondary | ICD-10-CM | POA: Insufficient documentation

## 2019-10-31 NOTE — Progress Notes (Signed)
CLINICAL PHARMACIST TELEPHONE VISIT    IDENTIFICATION/CHIEF COMPLAINT:   Ms. Dipietrantonio is a 54 year old female with a history of rheumatoid arthritis, osteoarthritis, history of back pain and multiple joint pains, specifically hip and elbow and hand joint pain.  Her right hip is especially troublesome.     REFERRAL/CONSULT: Dr. Doran Clay consulted PharmD on 10/17/2019 to provide clinical assessment and facilitate Nortriptyline titration.     Previous Evaluation by [facility] on 10/17/2019  I will prescribe low dose nortriptyline 25 mg at bedtime, and then if she does well, I will ask our clinical pharmacist to follow up with the patient in 2 weeks, and if she does well, we will recommend an increase of the dose to 50 mg at bedtime.    We will see the patient in followup in 4 weeks.    Current Analgesic Regimen:   Gabapentin 300 mg qAM & 600 mg QHS - last increased per patient on 12/29  Nortriptyline 25 mg QHS- started on 10/17/2019, stopped 10/27/2019 due to side effects    SUBJECTIVE/OBJECTIVE:  Patient reports that she stopped taking nortriptyline due to having sensation of chest 'pinching' and chest tightness that have since resolved after stopping the medication. She states that gabapentin has been helpful, as it appears mostly for back pain, but recently helps her hip and RA pain.     ASSESSMENT/PLAN:  Discontinue nortriptyline. If pain worsens or isn't adequately controlled then could increase gabapentin. Patient is not interested in increasing gabapentin at this time.      Pharmacist Follow-up: none, follow up with Dr. Doran Clay on 11/22/2019    Prescriptions Written during Encounter: none    I called and spoke to the patient over the telephone for this encounter. I spent a total time of 15 minutes on the phone with the patient, of which more than 50% was spent counseling as outlined in this note.     Lattie Haw, PharmD, BCPS  Clinical Pain Management Pharmacist  [place] [facility]

## 2019-11-03 ENCOUNTER — Other Ambulatory Visit (HOSPITAL_BASED_OUTPATIENT_CLINIC_OR_DEPARTMENT_OTHER): Payer: Self-pay | Admitting: Rheumatology

## 2019-11-03 DIAGNOSIS — M0579 Rheumatoid arthritis with rheumatoid factor of multiple sites without organ or systems involvement: Secondary | ICD-10-CM

## 2019-11-07 MED ORDER — MELOXICAM 7.5 MG OR TABS
7.5000 mg | ORAL_TABLET | Freq: Every day | ORAL | 1 refills | Status: DC | PRN
Start: 2019-11-07 — End: 2019-12-14

## 2019-11-15 ENCOUNTER — Telehealth (HOSPITAL_BASED_OUTPATIENT_CLINIC_OR_DEPARTMENT_OTHER): Payer: Self-pay | Admitting: Rheumatology

## 2019-11-15 DIAGNOSIS — M069 Rheumatoid arthritis, unspecified: Secondary | ICD-10-CM

## 2019-11-15 MED ORDER — RINVOQ 15 MG OR TB24
15.0000 mg | EXTENDED_RELEASE_TABLET | Freq: Every day | ORAL | 1 refills | Status: DC
Start: 2019-11-15 — End: 2020-04-10

## 2019-11-15 NOTE — Telephone Encounter (Signed)
Patient desires to be called back as their Rx has been sent in. Please close the encounter once the patient has been reached.    Please inform patient we never received a refill request from [place].

## 2019-11-15 NOTE — Telephone Encounter (Signed)
Patient informed. 

## 2019-11-15 NOTE — Telephone Encounter (Signed)
RETURN CALL: Voicemail - Detailed Message      SUBJECT:  Refill Request    NAME OF MEDICATION(S): Rinvoq  DATE NEEDED BY: ASAP   PRESCRIBING PROVIDER: Julaine Hua  PHARMACY NAME/LOCATION: [facility] [place] [phone]   ADDITIONAL INFORMATION: requesting a phone call to discuss why the clinic has no received refill request x3. Patient is only has 2 weeks left.

## 2019-11-15 NOTE — Telephone Encounter (Signed)
Patient needs a refill for Rinvoq, to [facility]

## 2019-11-20 ENCOUNTER — Encounter (HOSPITAL_BASED_OUTPATIENT_CLINIC_OR_DEPARTMENT_OTHER): Payer: Self-pay

## 2019-11-21 NOTE — Progress Notes (Signed)
Date PainTracker completed:  11/20/19    Pain Tracker Scores  ORT Score     -           7/26  AUDIT-C Score -        0/12  PHQ-9 Score  -           5/27  SI Score          -           0/3  GAD-7:           -           2/21  PTSD Score   -           2/4  STOP Score   -           1/4  FM Score       -            6/31    Pain intensity & Interference (lower is better)  Pain intensity              8/10     Pain Interference        9/10    Sleep (lower is better)  Sleep Initiation            2/10  Sleep Maintenance     2/10    ODI & Important Activity Difficulty (lower is better)  ODI                             48/100  Activity Difficulty         8/10    QOL & Satisfaction   Quality of Life                 8/10  Treatment Satisfaction   1/10    Days With Excess Meds (Last Month): none    Important Activity:      Most Bothersome Side Effect: no

## 2019-11-22 ENCOUNTER — Ambulatory Visit: Payer: No Typology Code available for payment source | Attending: Pain Management | Admitting: Pain Management

## 2019-11-22 DIAGNOSIS — M25551 Pain in right hip: Secondary | ICD-10-CM | POA: Insufficient documentation

## 2019-11-22 DIAGNOSIS — M069 Rheumatoid arthritis, unspecified: Secondary | ICD-10-CM | POA: Insufficient documentation

## 2019-11-22 DIAGNOSIS — G8929 Other chronic pain: Secondary | ICD-10-CM | POA: Insufficient documentation

## 2019-11-22 NOTE — Progress Notes (Signed)
Review of Systems   Constitutional: Negative.    HENT: Negative.    Eyes: Negative.    Respiratory: Negative.    Cardiovascular: Negative.    Gastrointestinal: Negative.    Endocrine: Negative.    Genitourinary: Negative.    Musculoskeletal: Positive for back pain.   Skin: Negative.    Allergic/Immunologic: Negative.    Neurological: Negative.    Hematological: Negative.    Psychiatric/Behavioral: Negative.

## 2019-11-23 NOTE — Progress Notes (Addendum)
RETURN TELEMEDICINE CONSULTATION      CONTEXT OF THE VISIT  I conducted this encounter from the [facility] via secure, live, face-to-face video conference with the patient. Autumn Camacho was located at her home with  No one in attendance..    Prior to interview, I have explained to the patient that:  . This method of communication has been selected in the context of the current COVID-19 global health crisis, to minimize travel and interpersonal interactions and preserve the health of patients, providers and our community;  . There are different risks with this method, including concerns regarding confidentiality, which we have worked to create a secure environment for our communication,  and the inability to perform a physical examination;  . The encounter is not being recorded and that permission is not granted for recording.    The patient expressed understanding and agreed.      PAST HISTORY:  Autumn Camacho has a current medication list which includes the following prescription(s): acetaminophen, gabapentin, meloxicam, multiple vitamins-minerals, rinvoq, and valacyclovir.  ALLERGIES:  Infliximab and Codeine      PRESCRIPTION MONITORING PROGRAM  [place] History (from 12/01/2018 to 11/22/2019)     Dispensed Days Supply Quantity   GABAPENTIN 300 MG CAPSULE 06/16/2019 30 60 unspecified         She is a very pleasant 54 year old female who we have seen in the clinic for her history of back pain, history of multiple joint pains, rheumatoid arthritis, and pain in her right hip which has been most prominent.    The patient participated in our mindfulness-based stress reduction program successfully and has been using mindfulness since then.    From a medication standpoint, we have increased her gabapentin up to 300 mg in the morning and 600 mg at night, and also trialled low dose nortriptyline; however, she tells [place] that she was unable to tolerate nortriptyline since she developed some  unpleasant sensation in her chest, so that this has been discontinued, but she has been very appreciative of effects of gabapentin on her pain.    With physical mobility physical exercises, she does do some, but she tells [place] that she would like to do more.  Now a lot of her focus is on her studies.  She is studying accounting in a community college.  She has 2 more quarters to go and then she will be graduating.    Her medications and allergies are reviewed and recorded.    She has been doing reasonably well otherwise, and believes that she is at a reasonable level with pain and is not interested in any higher doses of gabapentin also.    From a social standpoint, she recently moved with her husband to a new place.  She has settled into this new house now and she says that because it is a house, a larger area to move at and even walk up stairs, she has more physical activity now.    PHYSICAL EXAMINATION:  GENERAL:  She was alert and oriented, in no distress.  PSYCHIATRIC:  Affect was good.  She was in good spirits.  LUNGS:  Respirations unlabored.  CARDIAC:  No dyspnea.  No JVD distention.  MUSCULOSKELETAL:  She ambulated independently.    ASSESSMENT AND PLAN:  Autumn Camacho is seen today in followup via Zoom for her several joint pain problems and history of rheumatoid arthritis.  Most prominent pain typically has been in her right hip, but again  seems to be doing well now.  She is very engaged in her studies.  She is engaged in mindfulness meditation that she studied with [place] in our mindfulness-based stress reduction class a few months ago.    RECOMMENDATIONS:  1.  Gabapentin to continue at 300 mg plus 600 mg doses.  2.  Emphasis again on physical activity at a reasonable level.  She is aware of this, and whenever she would like a formal physical therapy referral she will let us know.  3.  Again emphasized utility of using mindfulness meditation for excess stress, but also as a pain modulating modality.    We will  see Autumn Camacho on an as-needed basis in the next few months whenever she decides she would like our input again.

## 2019-11-26 ENCOUNTER — Encounter (HOSPITAL_BASED_OUTPATIENT_CLINIC_OR_DEPARTMENT_OTHER): Payer: Self-pay | Admitting: Pain Management

## 2019-12-14 ENCOUNTER — Other Ambulatory Visit (HOSPITAL_BASED_OUTPATIENT_CLINIC_OR_DEPARTMENT_OTHER): Payer: Self-pay | Admitting: Rheumatology

## 2019-12-14 DIAGNOSIS — B009 Herpesviral infection, unspecified: Secondary | ICD-10-CM

## 2019-12-14 DIAGNOSIS — M0579 Rheumatoid arthritis with rheumatoid factor of multiple sites without organ or systems involvement: Secondary | ICD-10-CM

## 2019-12-15 MED ORDER — MELOXICAM 7.5 MG OR TABS
7.5000 mg | ORAL_TABLET | Freq: Every day | ORAL | 1 refills | Status: DC | PRN
Start: 2019-12-15 — End: 2020-04-16

## 2019-12-15 NOTE — Telephone Encounter (Addendum)
Duplicate, Rx sent [id number]

## 2020-01-10 ENCOUNTER — Ambulatory Visit (HOSPITAL_BASED_OUTPATIENT_CLINIC_OR_DEPARTMENT_OTHER): Payer: Self-pay

## 2020-01-10 DIAGNOSIS — Z23 Encounter for immunization: Secondary | ICD-10-CM

## 2020-01-15 ENCOUNTER — Other Ambulatory Visit (HOSPITAL_BASED_OUTPATIENT_CLINIC_OR_DEPARTMENT_OTHER): Payer: Self-pay

## 2020-01-15 DIAGNOSIS — M792 Neuralgia and neuritis, unspecified: Secondary | ICD-10-CM

## 2020-01-16 ENCOUNTER — Observation Stay
Admission: EM | Admit: 2020-01-16 | Discharge: 2020-01-17 | Disposition: A | Discharge status: Home / Self Care | Payer: No Typology Code available for payment source | Attending: Orthopaedic Surgery | Admitting: Orthopaedic Surgery

## 2020-01-16 ENCOUNTER — Emergency Department (EMERGENCY_DEPARTMENT_HOSPITAL): Payer: No Typology Code available for payment source

## 2020-01-16 ENCOUNTER — Observation Stay (HOSPITAL_COMMUNITY): Payer: Self-pay | Admitting: Orthopaedic Surgery

## 2020-01-16 ENCOUNTER — Emergency Department (HOSPITAL_BASED_OUTPATIENT_CLINIC_OR_DEPARTMENT_OTHER): Payer: No Typology Code available for payment source

## 2020-01-16 ENCOUNTER — Observation Stay (HOSPITAL_BASED_OUTPATIENT_CLINIC_OR_DEPARTMENT_OTHER): Payer: No Typology Code available for payment source

## 2020-01-16 ENCOUNTER — Other Ambulatory Visit: Payer: Self-pay

## 2020-01-16 ENCOUNTER — Encounter (HOSPITAL_COMMUNITY): Payer: Self-pay

## 2020-01-16 DIAGNOSIS — R69 Illness, unspecified: Secondary | ICD-10-CM | POA: Insufficient documentation

## 2020-01-16 DIAGNOSIS — S99812A Other specified injuries of left ankle, initial encounter: Secondary | ICD-10-CM

## 2020-01-16 DIAGNOSIS — M25562 Pain in left knee: Secondary | ICD-10-CM | POA: Insufficient documentation

## 2020-01-16 DIAGNOSIS — S86892A Other injury of other muscle(s) and tendon(s) at lower leg level, left leg, initial encounter: Secondary | ICD-10-CM | POA: Insufficient documentation

## 2020-01-16 DIAGNOSIS — S83105A Unspecified dislocation of left knee, initial encounter: Secondary | ICD-10-CM | POA: Insufficient documentation

## 2020-01-16 DIAGNOSIS — S79812A Other specified injuries of left hip, initial encounter: Secondary | ICD-10-CM

## 2020-01-16 DIAGNOSIS — S8982XA Other specified injuries of left lower leg, initial encounter: Secondary | ICD-10-CM

## 2020-01-16 DIAGNOSIS — S9305XD Dislocation of left ankle joint, subsequent encounter: Secondary | ICD-10-CM

## 2020-01-16 DIAGNOSIS — S83125A Posterior dislocation of proximal end of tibia, left knee, initial encounter: Principal | ICD-10-CM | POA: Insufficient documentation

## 2020-01-16 HISTORY — DX: Spinal stenosis, site unspecified: M48.00

## 2020-01-16 LAB — COMPREHENSIVE METABOLIC PANEL
ALT (GPT): 14 U/L (ref 7–33)
AST (GOT): 15 U/L (ref 9–38)
Albumin: 3.9 g/dL (ref 3.5–5.2)
Alkaline Phosphatase (Total): 67 U/L (ref 34–121)
Anion Gap: 7 (ref 4–12)
Bilirubin (Total): 0.3 mg/dL (ref 0.2–1.3)
Calcium: 9.1 mg/dL (ref 8.9–10.2)
Carbon Dioxide, Total: 27 meq/L (ref 22–32)
Chloride: 105 meq/L (ref 98–108)
Creatinine: 0.62 mg/dL (ref 0.38–1.02)
Glucose: 105 mg/dL (ref 62–125)
Potassium: 3.7 meq/L (ref 3.6–5.2)
Protein (Total): 6.6 g/dL (ref 6.0–8.2)
Sodium: 139 meq/L (ref 135–145)
Urea Nitrogen: 15 mg/dL (ref 8–21)
eGFR by CKD-EPI: >60 mL/min/{1.73_m2} (ref >59)

## 2020-01-16 LAB — CBC, DIFF
% Basophils: 0 %
% Eosinophils: 0 %
% Immature Granulocytes: 0 %
% Lymphocytes: 19 %
% Monocytes: 6 %
% Neutrophils: 75 %
% Nucleated RBC: 0 %
Absolute Eosinophil Count: 0 10*3/uL (ref 0.00–0.50)
Absolute Lymphocyte Count: 1.61 10*3/uL (ref 1.00–4.80)
Basophils: 0.02 10*3/uL (ref 0.00–0.20)
Hematocrit: 40 % (ref 36–45)
Hemoglobin: 13.1 g/dL (ref 11.5–15.5)
Immature Granulocytes: 0.02 10*3/uL (ref 0.00–0.05)
MCH: 31.4 pg (ref 27.3–33.6)
MCHC: 33 g/dL (ref 32.2–36.5)
MCV: 95 fL (ref 81–98)
Monocytes: 0.49 10*3/uL (ref 0.00–0.80)
Neutrophils: 6.21 10*3/uL (ref 1.80–7.00)
Nucleated RBC: 0 10*3/uL
Platelet Count: 250 10*3/uL (ref 150–400)
RBC: 4.17 10*6/uL (ref 3.80–5.00)
RDW-CV: 12.8 % (ref 11.6–14.4)
WBC: 8.35 10*3/uL (ref 4.30–10.00)

## 2020-01-16 LAB — LAB ADD ON ORDER

## 2020-01-16 LAB — SARS-COV-2 (COVID-19) QUALITATIVE RAPID PCR: COVID-19 Coronavirus Qual PCR Result: NOT DETECTED

## 2020-01-16 MED ORDER — ENOXAPARIN SODIUM 30 MG/0.3ML IJ SOSY
30.0000 mg | PREFILLED_SYRINGE | Freq: Two times a day (BID) | INTRAMUSCULAR | Status: DC
Start: 2020-01-16 — End: 2020-01-17
  Administered 2020-01-16: 30 mg via SUBCUTANEOUS
  Filled 2020-01-16 (×2): qty 0.3

## 2020-01-16 MED ORDER — GABAPENTIN 300 MG OR CAPS
300.0000 mg | ORAL_CAPSULE | Freq: Three times a day (TID) | ORAL | Status: DC
Start: 2020-01-16 — End: 2020-01-17
  Administered 2020-01-16 – 2020-01-17 (×3): 300 mg via ORAL
  Filled 2020-01-16 (×3): qty 1

## 2020-01-16 MED ORDER — KETAMINE HCL-SODIUM CHLORIDE 50-0.9 MG/5ML-% IV SOSY
75.0000 mg | PREFILLED_SYRINGE | Freq: Once | INTRAVENOUS | Status: AC
Start: 2020-01-16 — End: 2020-01-16

## 2020-01-16 MED ORDER — HYDROMORPHONE HCL 1 MG/ML IJ SOLN
0.5000 mg | INTRAMUSCULAR | Status: DC | PRN
Start: 2020-01-16 — End: 2020-01-16

## 2020-01-16 MED ORDER — OXYCODONE HCL 5 MG OR TABS
5.0000 mg | ORAL_TABLET | ORAL | Status: DC | PRN
Start: 2020-01-16 — End: 2020-01-17
  Administered 2020-01-16 (×2): 10 mg via ORAL
  Filled 2020-01-16 (×2): qty 2

## 2020-01-16 MED ORDER — HYDROMORPHONE HCL 1 MG/ML IJ SOLN
0.5000 mg | Freq: Once | INTRAMUSCULAR | Status: DC
Start: 2020-01-16 — End: 2020-01-16

## 2020-01-16 MED ORDER — FENTANYL CITRATE (PF) 100 MCG/2ML IJ SOLN
50.0000 ug | Freq: Once | INTRAMUSCULAR | Status: AC
Start: 2020-01-16 — End: 2020-01-16
  Administered 2020-01-16: 50 ug via INTRAVENOUS
  Filled 2020-01-16: qty 2

## 2020-01-16 MED ORDER — ONDANSETRON HCL 4 MG/2ML IJ SOLN
4.0000 mg | Freq: Three times a day (TID) | INTRAMUSCULAR | Status: DC | PRN
Start: 2020-01-16 — End: 2020-01-17

## 2020-01-16 MED ORDER — KETAMINE HCL-SODIUM CHLORIDE 50-0.9 MG/5ML-% IV SOSY
PREFILLED_SYRINGE | INTRAVENOUS | Status: AC
Start: 2020-01-16 — End: 2020-01-17
  Filled 2020-01-16: qty 10

## 2020-01-16 MED ORDER — ONDANSETRON HCL 4 MG/2ML IJ SOLN
4.0000 mg | Freq: Once | INTRAMUSCULAR | Status: AC
Start: 2020-01-16 — End: 2020-01-16
  Administered 2020-01-16: 4 mg via INTRAVENOUS
  Filled 2020-01-16: qty 2

## 2020-01-16 MED ORDER — MORPHINE SULFATE (PF) 2 MG/ML IV/IJ SOLN WRAPPER
4.0000 mg | Status: DC | PRN
Start: 2020-01-16 — End: 2020-01-17

## 2020-01-16 MED ORDER — POLYETHYLENE GLYCOL 3350 17 G OR PACK
17.0000 g | PACK | Freq: Every day | ORAL | Status: DC
Start: 2020-01-16 — End: 2020-01-17

## 2020-01-16 MED ORDER — MORPHINE SULFATE (PF) 4 MG/ML IV/IJ SOLN WRAPPER
Status: AC
Start: 2020-01-16 — End: 2020-01-16
  Administered 2020-01-16: 4 mg
  Filled 2020-01-16: qty 1

## 2020-01-16 MED ORDER — PROPOFOL 500 MG/50ML IV EMUL INFUSION
INTRAVENOUS | Status: AC
Start: 2020-01-16 — End: 2020-01-17
  Filled 2020-01-16: qty 100

## 2020-01-16 MED ORDER — GABAPENTIN 300 MG OR CAPS
ORAL_CAPSULE | ORAL | 2 refills | Status: DC
Start: 2020-01-16 — End: 2020-04-15

## 2020-01-16 MED ORDER — ONDANSETRON HCL 4 MG OR TABS
4.0000 mg | ORAL_TABLET | Freq: Three times a day (TID) | ORAL | Status: DC | PRN
Start: 2020-01-16 — End: 2020-01-17

## 2020-01-16 MED ORDER — KETAMINE HCL-SODIUM CHLORIDE 50-0.9 MG/5ML-% IV SOSY
PREFILLED_SYRINGE | INTRAVENOUS | Status: AC
Start: 2020-01-16 — End: 2020-01-16
  Administered 2020-01-16: 75 mg via INTRAVENOUS
  Filled 2020-01-16: qty 5

## 2020-01-16 MED ORDER — ONDANSETRON HCL 4 MG/2ML IJ SOLN
4.0000 mg | Freq: Once | INTRAMUSCULAR | Status: DC
Start: 2020-01-16 — End: 2020-01-17

## 2020-01-16 MED ORDER — LACTATED RINGERS BOLUS
1000.0000 mL | Freq: Once | INTRAVENOUS | Status: AC
Start: 2020-01-16 — End: 2020-01-16
  Administered 2020-01-16: 1000 mL via INTRAVENOUS

## 2020-01-16 MED ORDER — PROCHLORPERAZINE EDISYLATE 10 MG/2ML IJ SOLN
5.0000 mg | Freq: Once | INTRAMUSCULAR | Status: DC
Start: 2020-01-16 — End: 2020-01-16

## 2020-01-16 MED ORDER — ONDANSETRON HCL 4 MG/2ML IJ SOLN
INTRAMUSCULAR | Status: AC
Start: 2020-01-16 — End: 2020-01-16
  Administered 2020-01-16: 4 mg via INTRAVENOUS
  Filled 2020-01-16: qty 2

## 2020-01-16 MED ORDER — SENNOSIDES 8.6 MG OR TABS
17.2000 mg | ORAL_TABLET | Freq: Two times a day (BID) | ORAL | Status: DC
Start: 2020-01-16 — End: 2020-01-17
  Filled 2020-01-16 (×3): qty 2

## 2020-01-16 MED ORDER — SODIUM CHLORIDE 0.9 % IV SOLN
100.0000 mL/h | INTRAVENOUS | Status: DC
Start: 2020-01-16 — End: 2020-01-17

## 2020-01-16 MED ORDER — FENTANYL CITRATE (PF) 100 MCG/2ML IJ SOLN
50.0000 ug | INTRAMUSCULAR | Status: DC | PRN
Start: 2020-01-16 — End: 2020-01-16
  Administered 2020-01-16: 50 ug via INTRAVENOUS

## 2020-01-16 MED ORDER — KETAMINE HCL-SODIUM CHLORIDE 50-0.9 MG/5ML-% IV SOSY
PREFILLED_SYRINGE | INTRAVENOUS | Status: DC
Start: 2020-01-16 — End: 2020-01-16
  Filled 2020-01-16: qty 5

## 2020-01-16 NOTE — ED Provider Notes (Signed)
CHIEF COMPLAINT   Chief Complaint   Patient presents with    Leg Pain            HISTORY OF PRESENT ILLNESS   HPI   53 year old female with history of rheumatoid arthritis brought to ED by EMS for left knee pain. Patient states she was lying in bed when she attempted to get up and felt her knee hyperextend.  She had immediate pain and was not able to bear weight afterwards.  She called EMS for transport to the ED.  Denies fall, head trauma, LOC.  Denies Covid symptoms.                PAST MEDICAL AND SURGICAL HISTORY   Past Medical History:   Diagnosis Date    Chronic right hip pain 02/26/2019    Fracture     Bilateral elbow fx    Rheumatoid arthritis ([place])     Spinal stenosis             SOCIAL HISTORY   Social History     Tobacco Use    Smoking status: Former Smoker     Packs/day: 0.00     Years: 20.00     Pack years: 0.00     Types: Cigarettes     Quit date: 2015     Years since quitting: 6.2    Smokeless tobacco: Never Used    Tobacco comment: Off and on smoker   Substance Use Topics    Alcohol use: Not Currently     Comment: remote history of alcohol dependence    Drug use: Never        PAST FAMILY HISTORY   Family History     Problem (# of Occurrences) Relation (Name,Age of Onset)    Cancer (1) Father    Rheumatoid Arthritis (1) Other             ALLERGIES   Review of patient's allergies indicates:  Allergies   Allergen Reactions    Infliximab Anaphylaxis    Codeine Other     "Feels like her skin is crawling"          REVIEW OF SYSTEMS   GENERAL/CONSTITUTIONAL: Denies fever, chills  HEENT: NC/AT, moist mucus membranes, ST, rhinorrhea  CV: Denies chest pain, irregular heartbeats, shortness of breath  RESP: Denies cough or wheezing  GI: Abd pain, nausea, vomiting, diarrhea, constipation, hematochezia  MSK: + LLE knee pain. Denies weakness in upper extremities. Denies spinal tenderness.  SKIN: Denies rashes  NEURO: denies weakness, syncope         PHYSICAL EXAM   ED VITALS:     Vitals (Arrival)       T: 36.2 C (01/16/20 0939)  BP: 108/75 (01/16/20 0939)  HR: 72 (01/16/20 0939)  RR: 16 (01/16/20 0939)  SpO2: 100 % (01/16/20 0939)   Vitals (Most recent)   T: 36.2 C  BP: 108/75  HR: 72   RR: 16  SpO2: 100 %  T range: Temp  Min: 36.2 C  Max: 36.2 C  (no weight taken for this visit)     (no height taken for this visit)     There is no height or weight on file to calculate BMI.       Physical Exam  Constitutional:       Appearance: Normal appearance.   HENT:      Head: Normocephalic and atraumatic.      Nose: Nose normal.  Mouth/Throat:      Mouth: Mucous membranes are moist.      Pharynx: Oropharynx is clear.   Eyes:      Extraocular Movements: Extraocular movements intact.      Conjunctiva/sclera: Conjunctivae normal.      Pupils: Pupils are equal, round, and reactive to light.   Neck:      Musculoskeletal: Neck supple.   Cardiovascular:      Rate and Rhythm: Normal rate and regular rhythm.      Pulses: Normal pulses.   Pulmonary:      Effort: Pulmonary effort is normal.      Breath sounds: Normal breath sounds.   Abdominal:      General: Abdomen is flat. Bowel sounds are normal.      Palpations: Abdomen is soft.   Musculoskeletal:         General: Deformity present.      Comments: L posterior knee dislocation with palpable DP pulse. Fingers medially deviated with toe overlapping consistent with RA diagnosis.    Skin:     General: Skin is warm.      Capillary Refill: Capillary refill takes less than 2 seconds.   Neurological:      General: No focal deficit present.      Mental Status: She is alert.   Psychiatric:         Mood and Affect: Mood normal.           LABORATORY:   Labs Reviewed   COMPREHENSIVE METABOLIC PANEL       Result Value    Sodium 139      Potassium 3.7      Chloride 105      Carbon Dioxide, Total 27      Anion Gap 7      Glucose 105      Urea Nitrogen 15      Creatinine 0.62      Protein (Total) 6.6      Albumin 3.9      Bilirubin (Total) 0.3      Calcium 9.1      AST (GOT) 15       Alkaline Phosphatase (Total) 67      ALT (GPT) 14      eGFR, Calculated >60      GFR, Information        Value: Calculated GFR by CKD-EPI equation. Inaccurate with changing renal function. See [email or web address].[email or web address].   CBC, DIFF    WBC 8.35      RBC 4.17      Hemoglobin 13.1      Hematocrit 40      MCV 95      MCH 31.4      MCHC 33.0      Platelet Count 250      RDW-CV 12.8      % Neutrophils 75      % Lymphocytes 19      % Monocytes 6      % Eosinophils 0      % Basophils 0      % Immature Granulocytes 0      Neutrophils 6.21      Absolute Lymphocyte Count 1.61      Monocytes 0.49      Absolute Eosinophil Count 0.00      Basophils 0.02      Immature Granulocytes 0.02      Nucleated RBC 0.00      % Nucleated RBC 0  LAB ADD ON ORDER    Lab Test Requested BMP      Specimen Type/Description Blood      Sample To Use Blood      Test Request Status Duplicate request     SARS-COV-2 (COVID-19) QUALITATIVE RAPID PCR    COVID-19 Coronavirus Qual PCR Specimen Type Nasopharyngeal Swab      COVID-19 Coronavirus Qual PCR Result None detected      COVID-19 Coronavirus Qual PCR Interpretation        Value: This is a negative result. Laboratory testing alone cannot rule out infection, particularly in the presence of clinical risk factors such as symptoms or exposure history.    COVID-19 Qualitative PCR Indication Preprocedural Surveillance           IMAGING:     ED Wet Read -   XR Knee 4+ Vw Left   Final Result   FINDINGS AND IMPRESSION:      Status post left total knee arthroplasty with posterior subluxation of the tibia relative to the distal femur and varus alignment at the knee. Osseous fragment adjacent to the inferior patella suggestive of an avulsion fracture. No additional fractures detected.          I have personally reviewed these images, and I agree with the report (or as edited)      XR Ankle 3+ Vw Left   Final Result   FINDINGS AND IMPRESSION:      No acute fracture detected.   No  soft tissue abnormality.  Pes planus deformity with first TMT joint arthrodesis. Chronic fracture deformities of the second, third and fourth metatarsal heads and fifth metatarsal shaft with screw fixation.      Partially visualized degenerative changes at the MTP joints with lateral subluxation at the first through fourth MTP joints. Additional lateral subluxation at the fifth DIP joint that is favored to be secondary to arthritis.      I have personally reviewed these images, and I agree with the report (or as edited)      XR Hip Unilat  W Pelvis 2-3 Vw Left   Final Result   FINDINGS AND IMPRESSION:   No acute fractures or malalignment.      Severe DJD of the right hip with joint space narrowing and mild lateral subluxation of the femoral head relative to the acetabulum. The left hip is unremarkable.      Rightward L3-4 and leftward L4-5 degenerative laterolisthesis, 4 mm and 8 mm respectively.  Severe L4-5 and L5-S1 disc height loss and facet arthropathy.            I have personally reviewed these images, and I agree with the report (or as edited)      XR Tibia Fibula 2 View Left   Final Result   FINDINGS AND IMPRESSION:   See dedicated knee radiographs for the posteriorly subluxed total knee replacement.      No acute fractures of the tibia or fibula.      I have personally reviewed these images, and I agree with the report (or as edited)      XR Knee 1-2 Vw Left    (Results Pending)   Vascular US Arterial Duplex Lower Extremity Left    (Results Pending)       Radiology Final Result -   No image results found.              [facility]  ED COURSE/MEDICAL DECISION MAKING     ED Course as of Jan 16 1735   Tue Jan 16, 2020   1146 XR Tibia Fibula 2 View Left [NH]      ED Course User Index  [NH] Colin Mulders, [place]       54 year old female history of rheumatoid arthritis in emergency room for what appears to be a left posterior knee dislocation with history of total knee arthroplasty.  Pain  controlled with fentanyl.  Pulses palpated, sensation intact, able to move toes.  ABI 110/124 = 0.88. Unable to reduce to place in box splint due to patient pain.  Will obtain films of left hip, knee, ankle and tib-fib.  Anticipate patient will need CTA of the lower extremity as well.  Once x-rays obtained, will consult orthopedics for further management.     12:36 checked pulses after pt moved for XR. DP easily palpable.     13:00 imaging with avulsed inferior portion of patella with posteriorly dislocated tibia. Pain well controlled with IV pain medications. Orthopedics aware.     15:30 Orthopedics desires to perform conscious sedation with reduction in the ED.  Planning to perform once nursing able to support. Patient continues to do well with pain control with oxycodone. Pulse plapable.    1730 Conscious sedation performed without complication. Dislocation reduced successfully. Ortho plans to admit for obs overnight. Requested duplex arterial LLE to eval popliteal artery. Ordered.     Patient continued to have palpable pulses throughout hospital stay. Arterial duplex significant for normal flow. Patient admitted to orthopedics for monitoring overnight.     Patient seen and discussed with Dr. Chrystine Oiler and Dr. Franchot Heidelberg, Attending physicians.     Rochele Raring, DO   [facility]             DISPOSITION & IMPRESSION   Disposition:  Admit - orthopedics    Impression: (B44.967R) Closed posterior dislocation of proximal end of left tibia, initial encounter  (primary encounter diagnosis)    (F16.384Y) Patellar tendon avulsion, left, initial encounter    (M25.562) Acute pain of left knee           CRITICAL CARE - ATTENDING ONLY            ADDITIONAL INFORMATION REVIEWED  - ATTENDING ONLY           Colin Mulders, DO  Resident  01/17/20 1038

## 2020-01-16 NOTE — ED Triage Notes (Signed)
BIB BLS: from private residence, was moving her leg at 0800 this a.m. and "popped out" her L knee- hx knee replacement, arrives in splint

## 2020-01-16 NOTE — ED Notes (Signed)
BIB [facility] c/o L knee pain after knee replacement popped out of place this am while laying in bed. Denies other trauma. H/o HA. AO4, speaks in full sentences, calm and cooperative     Oleh Genin  01/16/20 1000

## 2020-01-16 NOTE — ED Procedure Notes (Signed)
PROCEDURE    Moderate Sedation    Date/Time: 01/16/2020 3:30 PM  Performed by: Wilhemina Cash, MD  Authorized by: Wilhemina Cash, MD     Consent:     Consent obtained:  Verbal    Consent given by:  Patient and spouse    Risks discussed:  Allergic reaction, dysrhythmia, nausea, vomiting, respiratory compromise necessitating ventilatory assistance and intubation, prolonged sedation necessitating reversal and prolonged hypoxia resulting in organ damage  Universal protocol:     Patient identity confirmation method:  Verbally with patient, arm band and hospital-assigned identification number  Indications:     Procedure performed:  Dislocation reduction    Procedure necessitating sedation performed by:  Different physician    Intended level of sedation:  Moderate (conscious sedation)  Pre-sedation assessment:     Time since last food or drink:  18 hours    ASA classification: class 3 - patient with severe systemic disease      Neck mobility: normal      Mouth opening:  3 or more finger widths    Thyromental distance:  3 finger widths    Mallampati score:  I - soft palate, uvula, fauces, pillars visible    Pre-sedation assessments completed and reviewed: airway patency, cardiovascular function, hydration status, mental status, nausea/vomiting and respiratory function      History of difficult intubation: no      Pre-sedation assessment completed:  01/16/2020 4:45 PM  Immediate pre-procedure details:     Reassessment: Patient reassessed immediately prior to procedure      Reviewed: vital signs and NPO status      Verified: bag valve mask available, emergency equipment available, intubation equipment available, IV patency confirmed, oxygen available, reversal medications available and suction available    Procedure details (see MAR for exact dosages):     Total sedation time (minutes):  25    Preoxygenation:  Nasal cannula    Sedation:  Ketamine    Analgesia:  None    Intra-procedure monitoring:  Blood pressure  monitoring, cardiac monitor, continuous capnometry, continuous pulse oximetry and frequent vital sign checks    Intra-procedure events: none    Post-procedure details:     Post-sedation assessment completed:  01/16/2020 5:15 PM    Attendance: Constant attendance by certified staff until patient recovered      Estimated blood loss (see I/O flowsheets): no      Post-sedation assessments completed and reviewed: airway patency, cardiovascular function and respiratory function      Specimens recovered:  None    Patient is stable for discharge or admission: yes      Patient tolerance:  Tolerated well, no immediate complications  Comments:      Sedation performed by Wilhemina Cash, MD            Colin Mulders, DO  Resident  01/16/20 [phone]

## 2020-01-16 NOTE — ED Notes (Signed)
Report called to [facility] transport [place], Aury Scollard R  01/16/20 1931

## 2020-01-16 NOTE — ED Notes (Signed)
ED Social Work Assessment    ID / CC / Reason for Referral: Pt is a 54 year old woman BIB [facility] c/o L knee pain and stated it "popped out." Pt has hx of knee replacement.       Identifying data/reason for referral:  Referral Source: Other (Comment)  Referral Reason: Other (Comment)    Social Work Summary:  HPI: SW met with pt at bedside, pt requesting husband at bedside. SW escorted pt husband Autumn to pt's room. He stated that he had been back previously, but stepped out for a bit. Autumn reported that they live together in [place].     Social History:  Living situation prior to admit: Home  Support system: Spouse/significant other    Contacts: Autumn Camacho, Spouse [phone]    Language: English  Interpreter: No     Impression: Pt awake at bedside, slightly disoriented due to medications as was reported by provider. Pt supported by husband at bedside.   Plan: Unkown dispo at this time. SW will remain available as needed.         , MSW  Emergency Department Social Worker  [facility]   [phone]

## 2020-01-16 NOTE — Telephone Encounter (Signed)
Medication: Gabapentin  Requested by: Patient    Last refill written: 10/17/2019  Written by: Dr Doran Clay    Last appointment: 11/26/2019 with Dr. Doran Clay  Scheduled follow up: Visit date not found

## 2020-01-16 NOTE — ED Notes (Signed)
ED Social Work Assessment    ID / CC / Reason for Referral:  Pt is a 54 yr old female BIB [facility] w/ c/o L knee pain after knee replacement popped out.       Identifying data/reason for referral:  Referral Source: Other (Comment)  Referral Reason: Other (Comment)(Visitor)    Social Work Summary:  HPI: EDSW was notified by ED [place] staff Pt's spouse was in the [place] requesting to join Pt at bedside. EDSW met with EDRN who indicated Pt can have one visitor at this time.     EDSW met with Pt at bedside and notified pt her spouse presented to the [place] and was requesting to visit with him. Pt notified EDSW of SO's name and stated he is a good support for her. EDSW met with pt' spouse and provided him with ED and inpatient visitor policy.     With Pt's permission, EDSW escorted visitor to bedside, he appeared to support the Pt well. EDSW has no concerns at this time and will remain available as needed for support.     Living situation prior to admit:  Home w/ spouse      Contacts:   Mali Hayton, Spouse [phone]      Language: English  Interpreter: No     Impression: Pt was awake, alert and able to engage with EDSW w/out difficulty.     Plan: Pt continues to be medically evaluated. EDSW will remain available for support as needed       Maryruth Hancock, MSW  Emergency Department Social Worker  [facility]  [phone]

## 2020-01-16 NOTE — Progress Notes (Signed)
I evaluated the patient at 2300 hours while maintaining a particular focus on the compartments of the left lower extremity given the nature of the injury. Upon evaluation, the patient appeared to be resting in bed, demonstrating no signs of severe discomfort.         With respect to the affected extremity, the anterior, lateral, and superficial posterior compartments are swollen, but still easily compressible. In terms of the sensory exam, sensation is intact to light touch in the sural, saphenous, superficial peroneal, deep peroneal, and tibial nerve distributions. Regarding the motor examination, the patient demonstrates intact motor function of the EHL and FHL against resistance. There is no discomfort with passive motion of all toes and ankle plantarflexion/dorsiflexion. The digits are warm to touch with palpable DP pulse.       I do not suspect the development of complications at this time, but a member of the Orthopaedic Surgery team will return to monitor the lower extremity at regular intervals throughout the night or sooner if issues arise.  Please page orthopaedics with any concerns or changes in exam.     Elby Showers MD  Emergency Medicine PGY-2

## 2020-01-16 NOTE — ED Procedure Notes (Signed)
PROCEDURE    Orthopedic Injury - Lower Extremity    Date/Time: 01/16/2020 5:43 PM  Performed by: Wilhemina Cash, MD  Authorized by: Wilhemina Cash, MD     Consent:     Consent obtained:  Verbal    Consent given by:  Patient  Location:     Location:  Knee    Knee injury location:  L knee    Knee dislocation type: rotary    Pre-procedure details:     Distal perfusion: normal      Range of motion: reduced    Procedure details:     Manipulation performed: yes      Knee reduction method:  Direct traction    Reduction successful: yes      Reduction confirmed with imaging: yes      Immobilization:  Brace  Post-procedure details:     Neurological function: normal      Distal perfusion: normal      Range of motion: improved      Patient tolerance of procedure:  Tolerated well, no immediate complications  Comments:      Left knee reduction done by ortho resident under my direct supervision             Wilhemina Cash, MD  01/16/20 1747

## 2020-01-16 NOTE — ED Notes (Signed)
Care assumed by, pt medicated per emar. Spouse at bs. Pt to XR     Jennye Boroughs R  01/16/20 1139

## 2020-01-16 NOTE — Consults (Signed)
Orthopaedic Surgery Consultation Note    Date of Service: 01/16/20  Reason for Consult: Closed L TKA posterior dislocation  Attending of Record: Rosalie Doctor MD (Ortho [facility])  Referring Provider: Emergency Department    Assessment and Plan:  Patient is a 54 year old female who we were consulted for closed L TKA posterior dislocation. She was distally motor/sensory intact with ABI > 0.9. In the ED, she was close reduced under conscious sedation. She will be admitted for observation and q4h vascular checks with plan for arterial duplex while in-house. Should remain NWB LLE in knee immobilizer.   The patient will be admitted to Ortho [facility]     Patient's plan was discussed and formulated with Dr. Leitha Schuller, the Orthopaedic Chief Resident on-call who is in agreement.    Activity:      NWB LLE  Anticoagulation:     Lovenox 30 mg BID unless otherwise clinically contraindicated.  Analgesia/Sedation:        Multimodal      History of Present Illness:   Autumn Camacho 54 year old female s/p L TKA in [id number] complicated by poly exchange for reported "instability" in 2019 performed by Dr. Delfino Lovett in [facility] with orthopaedic injuries including closed L TKA posterior dislocation. She reports she was rolling out of bed with her knee bent when she immediately felt pain to her L knee and was unable to bear weight prompting arrival in ED. Denies prior similar events. Denies assoc numbness/tingling.     Review of systems: Pertinent positives as per HPI and otherwise negative.    Past Medical History:   Past Medical History:   Diagnosis Date    Chronic right hip pain 02/26/2019    Fracture     Bilateral elbow fx    Rheumatoid arthritis ([place])     Spinal stenosis        Past Surgical History:   Past Surgical History:   Procedure Laterality Date    foot Bilateral     Arch surgery    PR TONSILLECTOMY ONE-HALF <AGE 51      PR UNLISTED PROCEDURE FEMUR/KNEE Bilateral     TKA    PR UNLISTED PROCEDURE FOOT/TOES  Bilateral 2009 R,  2010 L    Reconstructive surgery    TKA Left 2019    revision from prior TKA       Allergies:   Review of patient's allergies indicates:  Allergies   Allergen Reactions    Infliximab Anaphylaxis    Codeine Other     "Feels like her skin is crawling"       Medications:  Current Facility-Administered Medications   Medication Dose Route Frequency Provider Last Rate Last Admin    morphine PF injection 4 mg  4 mg Intravenous q10 min PRN Beecroft, Carola Rhine, MD        ondansetron (Zofran) injection 4 mg  4 mg Intravenous Once Beecroft, Carola Rhine, MD         Current Outpatient Medications   Medication Sig Dispense Refill    acetaminophen 500 MG tablet Take 2 tablets by mouth as needed.      gabapentin 300 MG capsule Take 300 mg in the morning and 600 mg at bedtime 90 capsule 2    meloxicam 7.5 MG tablet Take 1-2 tablets (7.5-15 mg) by mouth daily as needed for arthritis. 60 tablet 1    Multiple Vitamins-Minerals (MULTIVITAL OR) Take 1 tablet by mouth daily.      upadacitinib ER (  Rinvoq) 15 MG 24 hour tablet Take 1 tablet (15 mg) by mouth daily. 90 tablet 1    valACYclovir 1 g tablet TAKE 1 TABLET(1000 MG) BY MOUTH DAILY 30 tablet 5    Denies use of blood thinners.    Family History:   Family History     Problem (# of Occurrences) Relation (Name,Age of Onset)    Cancer (1) Father    Rheumatoid Arthritis (1) Other       Denies any personal or family history of bleeding or clotting problems, or complications from anesthesia.     Social History:   Social History     Socioeconomic History    Marital status: Single     Spouse name: Not on file    Number of children: Not on file    Years of education: Not on file    Highest education level: Not on file   Occupational History    Not on file   Social Needs    Financial resource strain: Not on file    Food insecurity     Worry: Not on file     Inability: Not on file    Transportation needs     Medical: Not on file     Non-medical: Not on file    Tobacco Use    Smoking status: Former Smoker     Packs/day: 0.00     Years: 20.00     Pack years: 0.00     Types: Cigarettes     Quit date: 2015     Years since quitting: 6.2    Smokeless tobacco: Never Used    Tobacco comment: Off and on smoker   Substance and Sexual Activity    Alcohol use: Not Currently     Comment: remote history of alcohol dependence    Drug use: Never    Sexual activity: Not on file   Lifestyle    Physical activity     Days per week: Not on file     Minutes per session: Not on file    Stress: Not on file   Relationships    Social connections     Talks on phone: Not on file     Gets together: Not on file     Attends religious service: Not on file     Active member of club or organization: Not on file     Attends meetings of clubs or organizations: Not on file     Relationship status: Not on file    Intimate partner violence     Fear of current or ex partner: Not on file     Emotionally abused: Not on file     Physically abused: Not on file     Forced sexual activity: Not on file   Other Topics Concern    Not on file   Social History Narrative    She moved to [place] from [place] in 04/2018.      Phone Number:   Telephone Information:   Home Phone [phone]   Work Phone [phone]   Mobile [phone]     Occupation/Functional Status: does not ambulate with assistive devices    Physical Exam:  BP 124/76    Pulse 71    Temp 36.2 C    Resp 16    SpO2 100%   GEN: Uncomfortable-appearing F lying supine on stretcher, in mild distress due to pain  RESP: symmetric bilateral chest rise, no use of accessory muscles  CV: extremities warm and well perfused, brisk capillary refill < 2 sec  MSK: exam limited by pain.     LEFT Lower Extremity Musculoskeletal Exam  Inspection: Gross deformity of L knee with puckering over the anteromedial aspect concerning for posterior dislocation. Knee stiff unable to range beyond 60 deg.   Sensory: Sensation intact to light touch:  deep/superficial peroneal, tibial, sural, saphenous nerves  Motor: Strength intact to resistance extensor / flexor hallucis longus, gastrocnemius - soleus complex, tibialis anterior.  Vascular: Warm and well perfused with < 2 second capillary refill. ABI 0.94    Labs:  COVID neg    Radiology:  Reviewed imaging which demonstrates posterior subluxation of tibia component relative to distal femur with associated avulsion fracture of inferior patella.    Julien Nordmann. Maude Leriche, M.D.  Resident   [facility]

## 2020-01-17 ENCOUNTER — Other Ambulatory Visit (HOSPITAL_BASED_OUTPATIENT_CLINIC_OR_DEPARTMENT_OTHER): Payer: Self-pay

## 2020-01-17 DIAGNOSIS — R69 Illness, unspecified: Secondary | ICD-10-CM

## 2020-01-17 LAB — BASIC METABOLIC PANEL
Anion Gap: 7 (ref 4–12)
Calcium: 9 mg/dL (ref 8.9–10.2)
Carbon Dioxide, Total: 25 meq/L (ref 22–32)
Chloride: 106 meq/L (ref 98–108)
Creatinine: 0.65 mg/dL (ref 0.38–1.02)
Glucose: 73 mg/dL (ref 62–125)
Potassium: 4 meq/L (ref 3.6–5.2)
Sodium: 138 meq/L (ref 135–145)
Urea Nitrogen: 14 mg/dL (ref 8–21)
eGFR by CKD-EPI: >60 mL/min/{1.73_m2} (ref >59)

## 2020-01-17 LAB — CBC (HEMOGRAM)
Hematocrit: 41 % (ref 36–45)
Hemoglobin: 13.3 g/dL (ref 11.5–15.5)
MCH: 31.7 pg (ref 27.3–33.6)
MCHC: 32.8 g/dL (ref 32.2–36.5)
MCV: 97 fL (ref 81–98)
Platelet Count: 206 10*3/uL (ref 150–400)
RBC: 4.2 10*6/uL (ref 3.80–5.00)
RDW-CV: 13.2 % (ref 11.6–14.4)
WBC: 6.71 10*3/uL (ref 4.30–10.00)

## 2020-01-17 MED ORDER — ACETAMINOPHEN 325 MG OR TABS
650.0000 mg | ORAL_TABLET | Freq: Four times a day (QID) | ORAL | Status: DC
Start: 2020-01-17 — End: 2020-01-17
  Administered 2020-01-17: 650 mg via ORAL
  Filled 2020-01-17: qty 2

## 2020-01-17 NOTE — Progress Notes (Addendum)
Physical Therapy  Physical Therapy Evaluation/Treatment      Patient Name: Autumn Camacho  MRN: [id number]  Today's Date: 01/17/2020    General Visit Information  General  Documentation Type: Initial Eval  Treatment Start Time: 1300  Treatment End Time: [id number]  Treatment Duration (min): 40 Mins  Family/Caregiver Present: No    Physical Therapy Diagnosis  PT Diagnosis: LEFT posterior TKA dislocation    Patient Active Problem List   Diagnosis    Rheumatoid arthritis involving multiple sites [facility])    Chronic right hip pain    Rheumatoid arthritis ([place])    Rheumatoid arthritis involving right hand (H[facility])    Arthritis of carpometacarpal ([facility]) joints of both thumbs    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    Osteoarthritis of right hip    Osteoarthritis of right knee    Knee dislocation, left, initial encounter       Past Medical History:   Diagnosis Date    Chronic right hip pain 02/26/2019    Fracture     Bilateral elbow fx    Rheumatoid arthritis ([place])     Spinal stenosis        Past Surgical History:   Procedure Laterality Date    foot Bilateral     Arch surgery    PR TONSILLECTOMY ONE-HALF <AGE 60      PR UNLISTED PROCEDURE FEMUR/KNEE Bilateral     TKA    PR UNLISTED PROCEDURE FOOT/TOES Bilateral 2009 R,  2010 L    Reconstructive surgery    TKA Left 2019    revision from prior TKA       Subjective  Subjective  Patient Report/Self-Assessment: Pt reports she has no pain and feels ready to go home. She reports she feels her KI is too big    [place] Living  Type of Home: House  Indoor Stairs: 10  Lives With: Spouse;Daughter  Mobility Equipment Owned: [place] wheeled or pick-up walker;Single point cane    Prior Level of Function  Prior Function  Prior Level of Function: Independent with all functional mobility and ADLs  Type of Occupation: Nutritional therapist Available at Home: 24 hour assistance    Objective     Pain  Pain Assessment  Pain Assessment: No/denies  pain  Pain Score: 3  Pain Descriptor Scale: None    Cognition         Extremity Assessments  RUE Assessment  RUE Assessment: Within Functional Limits              LUE Assessment  LUE Assessment: Within Functional Limits              RLE Assessment  RLE Assessment: Within Functional Limits              LLE Assessment  LLE Assessment: Exceptions to [facility]  LLE Assessment Comments: L knee in KI, hip and ankle moving functioanlly against gravity            Anthropometric Measurements  Anthropometric Measurements  Height: 5' 0.98" (154.9 cm)  Weight: 73.5 kg (162 lb 0.6 oz)  Static Sitting Balance  Static Sitting Balance  Static Sitting-Balance Support: No upper extremity supported  Static Sitting Level of Assistance: Independent  Dynamic Sitting Balance     Static Standing Balance  Static Standing Balance  Static Standing Balance: No upper extremity supported  Static Standing Balance Level of Assistance: Independent    Bed Mobility  Bed Mobility  Supine>Sit:  Independent  Sit>Supine: Independent  Transfers  Transfers  Sit<>Stand: Barrister's clerk  Distance: 300'  Assistance Needs: Supervision / Stand by assistance  Assistive Device Used: Single point cane  Gait quality/descriptors: Circumduction  Stairs  Stairs  Number of steps: 4  Style: Step to  Assistance Needs: Supervision / Stand by assistance    CAM-ICU  Confusion Assessment Method-ICU (CAM-ICU)  Feature 3: Altered Level of Consciousness: Negative  RASS  [place] Agitation Sedation Scale (RASS)  [place] Agitation Sedation Scale (RASS): Alert and calm      Assessment/Plan  PT Assessment Results: Decreased strength;Decreased range of motion  Impact on Function and Participation: Decreased strength and ROM in L LE limiting functional gait and stairs. Pt will require supervision for stairs and longer ambulation at home. Family to provide Assist. Pt cleared PT  Prognosis: Excellent  Equipment management: Adjusted and fit  KI  Evaluation/Treatment Tolerance: Patient tolerated treatment well    Plan  Treatment/Interventions: Gait/Stairs training  PT Plan: No skilled PT/Discharge PT  PT Discharge Recommendations: Follow up with PT at next level of care (to be determined by primary team)  Equipment Recommended: Single point cane      Blenda Peals, PT  01/17/2020

## 2020-01-17 NOTE — Progress Notes (Addendum)
DC pending PT consult- as soon as pt clears PT/ mobilizes; she can DC home.  This CCN called PT and they will try to see her ASAP and will call CCN to let me know if pt clears today or not.  This CCN informed RN of this plan.

## 2020-01-17 NOTE — Nursing Note (Incomplete)
Patient Summary  Patient VSS, WBAT LLE, No c/o pain. DC teaching and instructions reviewed with patient. Patient states no questions or concerns at this time. IV DC'd and belongings returned to patient.   Edited by: Stephens November, RN at 01/17/2020 1013    Illness Severity  Stable   Edited by: Stephens November, RN at 01/17/2020 [phone]

## 2020-01-17 NOTE — Progress Notes (Signed)
01/17/20 Update at 2:00PM  This CCN spoke to PT who has cleared pt for DC home. Pt transpo is pers ride; on way as soon as Therapist, sports reviews DC Instructions.

## 2020-01-17 NOTE — Nursing Note (Signed)
Patient Summary  54 year old female woke up this morning getting out of bed her left knee patella was dislocated it was manipulated back into place once she made it to [place].  Edited by: Peri Maris, RN at 01/17/2020 [phone]    Illness Severity  Stable  Edited by: Peri Maris, RN at 01/17/2020 [phone]

## 2020-01-17 NOTE — Progress Notes (Signed)
Progress Note     Autumn Camacho") - DOB: October 13, 1966 (54 year old female)  Preferred Pronouns: she/her/hers    Code Status: Full Code       CHIEF CONCERN / IDENTIFICATION:  Autumn Camacho is a 54 year old female with PMH of Rheumatoid arthritis, bilateral TKA, presenting with LEFT posterior TKA dislocation.      SUBJECTIVE     INTERVAL HISTORY:  Patient seen this AM, reports pain is well controlled. Vascular checks overnight reassuring. Arterial duplex with patent arteries in LLE. No acute concerns. Already has RA follow-up at [facility] clinic with Rheum (Dr. Janan Halter) and Foot and Hand teams.     SCHEDULED MEDICATIONS:   enoxaparin 30 mg q12h [facility]  gabapentin 300 mg q8h [facility]  ondansetron 4 mg Once  polyethylene glycol 3350 17 g Daily  senna 17.2 mg BID        INFUSED MEDICATIONS:  sodium chloride     PRN MEDICATIONS:  morphine PF injection 4 mg, 4 mg, Intravenous, q10 min PRN, Beecroft, Carola Rhine, MD,     ondansetron (Zofran) injection 4 mg, 4 mg, Intravenous, q8h PRN, Nhan, Ebbie Ridge, MD,     ondansetron (Zofran) tablet 4 mg, 4 mg, Oral, q8h PRN, Nhan, Ebbie Ridge, MD,     oxyCODONE tablet 5-10 mg, 5-10 mg, Oral, q3h PRN, Beecroft, Carola Rhine, MD, 10 mg at 01/16/20 1603,        OBJECTIVE     Vitals (Most recent)     T: 37 C  BP: 104/57  HR: 64   RR: 18  SpO2: 97 %  T range: Temp  Min: 36.1 C  Max: 37 C  Admit weight: 73.5 kg (162 lb) (01/16/20 1637)  Last weight: 73.5 kg (162 lb 0.6 oz) (01/16/20 2300)     I&Os:    Intake/Output Summary (Last 24 hours) at 01/17/2020 0814  Last data filed at 01/17/2020 0600  Intake 225 ml   Output --   Net 225 ml       Physical Exam  General: resting comfortably in bed, in NAD  Pulm: Breathing unlabored  Neuro: Answering questions appropriately    MSK:  Bilateral upper extremtiy  -Swan neck deformity with the majority of her MCP and PIP joints inflammed and swollen  -Painless range of motion at shoulder, elbow, wrist and fingers  -Motor: able to give ok sign,  extend thumb, abduct and adduct fingers in line with her baseline given her RA  -Sensation: Intact distally to light touch in axillary, musculocutaneous, median, ulnar, radial  -Vascular: Fingers warm and well perfused, good capillary refill    Right Lower Extremity  -No gross deformity, nontender to palpation over extremity, medial incision over knee consistent with TKA  -Painless range of motion at hip, knee, ankle, toes; no discomfort with log roll of hip  -Sensation: intact distally to light touch over deep/superficial peroneal, tibial, saph, sural  -Motor: 5/5 EHL/FHL/GC/TA  -Vascular: Toes warm and well perfused, good capillary refill, 2+ DP    Left Lower Extremity  -Knee immobilizer in place, thigh and leg soft to palpation  -Painless range of motion at hip, knee, ankle, toes; no discomfort with log roll of hip  -Sensation: intact distally to light touch over deep/superficial peroneal, tibial, saph, sural  -Motor: 5/5 EHL/FHL/GC/TA  -Vascular: Toes warm and well perfused, good capillary refil, 2+ DP      Labs (last 24 hours):   Chemistries  CBC  LFT  Gases,  other   138 106 14 73   13.3   AST: 15 ALT: 14  -/-/-/-   -/-/-/-   4.0 25 0.65   6.71 >< 206  AP: 67 T bili: -  Lact (a): - Lact (v): -   eGFR: >60 Ca: 9.0   41   Prot: 6.6 Alb: 3.9  Trop I: - D-dimer: -   Mg: - PO4: -  ANC: 6.21     BNP: - Anti-Xa: -     ALC: 1.61    INR: -        LEFT Lower extremity Arterial Duplex (01/16/20)  LEFT LOWER EXTREMITY ARTERIES (LIMITED)  1.  Widely patent popliteal artery with biphasic waveforms noted throughout.  2.  Widely patent distal posterior tibial and anterior tibial arteries with biphasic waveforms.  3.  Widely patent common femoral and distal femoral arteries with triphasic waveforms.     ASSESSMENT/PLAN     Autumn Camacho is a 54 year old female with PMH of Rheumatoid arthritis, lumbar stenosis, bilateral TKA, presenting with LEFT posterior TKA dislocation s/p closed reduction in the ED.    #LEFT TKA  posterior dislocation  - Arterial duplex without vascular injury  - WBAT in Knee Immobilizer (keep on until follow-up)  - PT eval  - Follow-up with [place] [facility] surgeons at [facility] to discuss revision TKA on LEFT in 1-2 weeks    #Secondary  - Injuries as noted above, otherwise negative 3/31. As pain from primary injury resolves new issues may make themselves apparent. Please obtain imaging as indicated and page Ortho [facility] with any concerns.    Dispo: pending PT, likely home today    Carrie Mew, MD [facility]  PGY1

## 2020-01-17 NOTE — Nursing Note (Signed)
Patient Summary  Left knee patella dislocated manipulated back into place  Edited by: Peri Maris, RN at 01/17/2020 0248    Illness Severity  Stable  Edited by: Peri Maris, RN at 01/17/2020 [phone]

## 2020-01-17 NOTE — Discharge Instructions (Signed)
Joint Dislocation    You have a joint dislocation. This happens when a strong force is applied to the joint, tearing ligaments and forcing the bones out of place.Often no bones are broken. But the nearby nerves and blood vessels can be damaged.  Once the joint is put back in place, it will take at least6 weeks for the ligaments to heal. Range of motion exercises or physical therapy may be prescribed early in your recovery. This is to prevent the joint from getting stiff. Later, strengthening exercises may be added.  In more severe cases, surgery may be needed to realign the joint and repair the torn ligaments or any broken bones. After a dislocation, the joint may not regain full range of motion or heal fully. Also, if the joint surface was fractured or broken, you are at risk of getting arthritis in that joint.  Home care   If you were given a sling or splint, wear it until your next provider visit. Don't take it off at night to sleep.It is possible to re-injure the joint while you sleep. Unless told otherwise, you may take off the sling or splint to bathe or dress.   If no bones were broken, it's important to begin moving the joint during the first few weeks after the injury. This is to prevent the joint from getting stiff. On your next visit, ask your provider when you should begin these exercises.   Apply an ice pack to the injured area forno more than20 minutes. Do thisevery3 to 6hoursfor the first24 to 48 hours. Keep using ice 4 times a day for the next few days until the pain is better. To make an ice pack, put ice cubes in a plasticbag that seals at the top. Wrap the bag in aclean, thintowel or cloth. Never put ice or an ice pack directly on the skin. You can place the ice pack inside the sling or over the splint. As the ice melts, be careful that thesling orsplint doesn't get wet.   You may use over-the-counter pain medicine to control pain, unless another pain medicine was prescribed.  Talk with your provider before using these medicines if you have chronic liver or kidney disease, or ever had a stomach ulcer or GI (gastrointestinal) bleeding.    Follow-up care  Follow up with your healthcare provider in 1 week, or as advised.  If X-rays or a CT scan, were taken, you will be notified of any new findings that may affect your care.  When to seek medical advice  Call your healthcare provider right awayif any of these occur:   There is increasing swelling or pain in or around the injured joint   Your affected arm or leg becomes cold, blue, numb, or tingly   You have redness, warmth, fever, or chills  [employer] last reviewed this educational content on 03/19/2017   2000-2020 [employer] [address], [place] [address]. All rights reserved. This information is not intended as a substitute for professional medical care. Always follow your healthcare professional's instructions.

## 2020-01-17 NOTE — Progress Notes (Signed)
I evaluated the patient at 2300 hours while maintaining a particular focus on the compartments of the left lower extremity given the nature of the injury. Upon evaluation, the patient appeared to be resting in bed, demonstrating no signs of severe discomfort.         With respect to the affected extremity, the anterior, lateral, and superficial posterior compartments are swollen, but still easily compressible. In terms of the sensory exam, sensation is intact to light touch in the sural, saphenous, superficial peroneal, deep peroneal, and tibial nerve distributions. Regarding the motor examination, the patient demonstrates intact motor function of the EHL and FHL against resistance. There is no discomfort with passive motion of all toes and ankle plantarflexion/dorsiflexion. The digits are warm to touch with palpable DP pulse.       I do not suspect the development of complications at this time, but a member of the Orthopaedic Surgery team will return to monitor the lower extremity at regular intervals throughout the night or sooner if issues arise.  Please page orthopaedics with any concerns or changes in exam.     Elby Showers MD  Emergency Medicine PGY-2

## 2020-01-19 ENCOUNTER — Telehealth (INDEPENDENT_AMBULATORY_CARE_PROVIDER_SITE_OTHER): Payer: Self-pay

## 2020-01-19 NOTE — Telephone Encounter (Signed)
RETURN CALL: Voicemail - Detailed Message      SUBJECT:  Appointment Request     REASON FOR VISIT: ER follow up: knee   PREFERRED DATE/TIME: Within 1-2 weeks per discharge. Patient prefers later in the week, any time.   ADDITIONAL INFORMATION: Thank you!

## 2020-01-19 NOTE — Telephone Encounter (Signed)
Patient is scheduled for 4/16 with Dr. Catalina Gravel.  Patient is aware an authorization is required from PCP and insurance for the appointment.

## 2020-01-22 ENCOUNTER — Telehealth (HOSPITAL_BASED_OUTPATIENT_CLINIC_OR_DEPARTMENT_OTHER): Payer: Self-pay | Admitting: Orthopaedic Surgery

## 2020-01-22 NOTE — Telephone Encounter (Signed)
Called back Zena.  Apolonio Schneiders stated she needed some information regarding the upcoming appointment with Dr. Catalina Gravel.  Apolonio Schneiders wanted to know the reason for the visit, ICD code, Dr. Derek Mound name and clinic's fax number.  Provided Apolonio Schneiders with the information.

## 2020-01-22 NOTE — Telephone Encounter (Signed)
RETURN CALL: Voicemail - Detailed Message      SUBJECT:  General Message/can speak to anyone that answers    MESSAGE: PSR from patient PCP office called regarding the referral that needs to go to the patient insurance. She will like a call from [facility] or [facility] regarding this matter. The patient has an appointment on 02/02/2020 Please advise, thank you.

## 2020-01-23 ENCOUNTER — Telehealth (INDEPENDENT_AMBULATORY_CARE_PROVIDER_SITE_OTHER): Payer: Self-pay | Admitting: Orthopaedic Surgery

## 2020-01-23 NOTE — Telephone Encounter (Signed)
RETURN CALL: Voicemail - Detailed Message      SUBJECT:  General Message     MESSAGE: Mindy called requesting CPT code, In patient or outpatient, DOS, and frequency of visit needed.  .Please call back to be advise.

## 2020-01-23 NOTE — Telephone Encounter (Signed)
Called back [facility], provided the cpt code for new patient, [phone] and ICD 10 code- S83.105A, for 6 visits, and informed that this is the first consultation and unsure if and when surgery will be.

## 2020-01-27 ENCOUNTER — Ambulatory Visit (HOSPITAL_BASED_OUTPATIENT_CLINIC_OR_DEPARTMENT_OTHER): Payer: Self-pay

## 2020-01-27 DIAGNOSIS — Z23 Encounter for immunization: Secondary | ICD-10-CM

## 2020-02-02 ENCOUNTER — Encounter (INDEPENDENT_AMBULATORY_CARE_PROVIDER_SITE_OTHER): Payer: Self-pay | Admitting: Orthopaedic Surgery

## 2020-02-02 ENCOUNTER — Ambulatory Visit (INDEPENDENT_AMBULATORY_CARE_PROVIDER_SITE_OTHER): Payer: No Typology Code available for payment source | Admitting: Orthopaedic Surgery

## 2020-02-02 VITALS — BP 102/82 | HR 72 | Temp 99.6°F | Ht 61.0 in | Wt 167.5 lb

## 2020-02-02 DIAGNOSIS — M05751 Rheumatoid arthritis with rheumatoid factor of right hip without organ or systems involvement: Secondary | ICD-10-CM

## 2020-02-02 DIAGNOSIS — M0579 Rheumatoid arthritis with rheumatoid factor of multiple sites without organ or systems involvement: Secondary | ICD-10-CM

## 2020-02-02 DIAGNOSIS — T84093A Other mechanical complication of internal left knee prosthesis, initial encounter: Secondary | ICD-10-CM

## 2020-02-02 DIAGNOSIS — Z96651 Presence of right artificial knee joint: Secondary | ICD-10-CM

## 2020-02-02 NOTE — Patient Instructions (Addendum)
·   Your surgery with Dr. Rondall Allegra is scheduled on: 04/16/20   Please have your medical clearance ready or sent back to me from your Primary Care Physician before your pre-op appointment with Dr. Catalina Gravel; so anytime before: 03/20/20    Please call for questions or concerns.    Thank you,   Martina Sinner   Dr. Derek [facility] Surgery Scheduler  Phone: [phone]   Fax: [phone]

## 2020-02-02 NOTE — Progress Notes (Signed)
Please see the above note by the orthopaedic resident.  I was present with the resident during both the history and physical examination and the results described are corroborated by my own findings.  I discussed the case with the resident, and agree with the findings and plan as documented in that note.      Briefly, Ms. Cropper underwent bilateral total knee arthroplasty in [place] in 2018 with Rod Can.  Because of instability, the left was revised the next year; his notes discuss the possibility of poly exchange (which was done) versus revision of all components.  She reports that both knees have always felt somewhat unstable, but was able to manage.      She suffered a dislocation of the left knee two weeks ago; this was reduced by traction at [facility].      On exam today, she is a healthy appearing 54 yo female, with sequelae of RA evident in both hands, and both feet.    Exam of the right hip:  No visible atrophy.  There is pain with active straight-leg raise, and with passive rotation. Greater trochanter is non-tender to palpation.  Quad strength 5/5 against resistance. Markedly restricted and painflu passive ROM.     Exam of the left hip:  No visible atrophy.  No pain with active straight-leg raise, no pain or crepitus with passive rotation. Greater trochanter is non-tender to palpation.  Quad strength 5/5 against resistance.  Full, supple, pain-free passive ROM.     Exam of the right knee:  On inspection, alignment is normal.  Skin exam shows a well-healed incision.  On palpation, the knee is nontender to palpation.  Range of motion is 20 degrees of hyperextension to 125 of flexion.  Stability exam shows less than one cm of anterior-posterior instability and marked medial-lateral laxity.  Muscle bulk, power, and tone are markedly atrophic.  Patellar grind and compression is  positive .    Exam of the left knee:  On inspection, alignment is normal.  Skin exam shows a well-healed incision.  On  palpation, the knee is tender around the patella.  Range of motion is 20 hyper to 125 of flexion.  Stability exam shows gross multi-directional instability.  Muscle bulk, power, and tone are markedly atrophic.  Patellar grind and compression is  negative.    I personally reviewed radiographs of the affected joints and noted the following with respect to the right hip:  severe joint space narrowing  subchondral cysts  periarticular osteophytes  joint subluxation    Based on these findings, I interpret these radiographs as demonstrating severe degenerative joint disease, consistent with Kellgren-Lawrence class IV, of the right hip.    With respect to the knees, there are bilateral total knee arthroplasties present; both appear well fixed.  The left is notable for a very large polyethylene bearing.       My impression is of a failed CR knee, with resulting instability despite the use of a thicker polyethylene bearing.  In view of her severe RA, I recommended to her that we consider revision to a more constrained device; a CCK would be my choice here.  She would like to proceed with this, since she does not trust the knee and is appropriately concerned about vascular injury if her knee dislocates again.    Her right hip is quite painful.  WE discussed total hip arthroplasty for this, but any treatment of the hip will have to follow that of the left knee.  Schedule for revision left knee.    Rondall Allegra, MD  [facility]  Hip and Knee Arthritis  Professor  Department of Orthopaedics and [employer]

## 2020-02-02 NOTE — Progress Notes (Signed)
Primary Care Physician: Terressa Koyanagi, MD    Referring Physician: Dr. Jeannene Patella      Dear Dr. Jeannene Patella:    It was a pleasure to see your patient, [name], in clinic.  As you recall, [name] is a 54 year old female with a 2 week long history of Left knee instability s/p Left TKA dislocation on 01/16/2020 managed with closed reduction at [facility] ED. her mechanism of injury was stretching in bed resulting in slight hyper extension of the left knee.  She was admitted overnight for compartment checks and arterial duplex. All evaluation in hospital was reassuring for no concern for vascular injury or neurologic injury. She was discharged with a knee immobilizer and comes to clinic today for discussion of future management.    Patient has rheumatoid arthritis which is managed by her rheumatologist with oral DMARD medications.  She has a history of bilateral total knee arthroplasties.  Her right knee total arthroplasty was in July 2018.  She had a left knee arthroplasty in May 2018.  It remained unstable postoperatively and was revised in June 2019.  This was all done at [facility] in [place].    [name] principal limitation is inability to ambulate without the support of a knee brace given gross instability of the left knee, and so she presents for evaluation.    Functional history reveals that she can walk indoors only before needing to stop or rest.  [name] uses the aid of a cane for ambulation.  [name] does stairs needing a banister, one step at a time, and can sit in any chair, at least 1 hour.  [name] arises from a chair needing arm assistance, and can reach socks and shoes with difficulty.    On a visual-analogue pain scale, she rates the pain as a 0 out of 10 in severity at rest, and as a 1 out of 10 in severity with weight bearing; she has tried activity modifications to relieve the pain with limited success.    [name] has the following drug allergies: Infliximab  and Codeine    [name]'s past medical history is significant for rheumatoid arthritis and lumbar stenosis    [name] has a current medication list which includes the following prescription(s): acetaminophen, gabapentin, meloxicam, multiple vitamins-minerals, rinvoq, and valacyclovir.    [name] family history is negative.     [name] past surgical history includes:   Right total knee arthroplasty, July 2018  Left total knee arthroplasty, May 2018  Left revision total knee arthroplasty, June 2019 (for instability)    Social history:   Employment status - Unemployed.  Marital status - married.   Number of Children - 2 .   Tobacco - Doesn't smoke.   Alcohol - Does not drink alcohol.  Stairs into house - 0. Stairs to bedroom - 0.    Comprehensive ROS is is negative.   .  [name] denies shortness of breath, chest pain or difficulty breathing.  The remaining systems out of a total of 14 (see standard form) are negative.    __________________________  Ht 5\' 1"  (1.549 m)  Wt 167 lb 8.8 oz (76 kg)  Body mass index is 31.66 kg/m.  __________________________    On physical exam, [name] looks her stated age, and she is oriented to time, place, and person.  She demonstrates no excessive respiratory effort.  Her gait is moderately antalgic. The overall limb alignment is neutral  with weight bearing on the right, and neutral on the left.    Exam of the right knee:  Quads with mild atrophy. No effusion.  Patella tracks centrally.  Passive full ROM, without pain.  No tenderness to palpation of patellar facets or joint lines.  No pain with deep forced flexion, quad strength 4/5 against resistance. Laxity to posterior drawer. Patellar grind test is negative. ROM -20 to 120    Exam of the left knee: Quads with mild atrophy. No effusion.  Patella tracks centrally.  Passive full ROM, without pain.  No tenderness to palpation of patellar facets or joint lines.  No pain with deep forced flexion, quad  strength 4/5 against resistance.  Gross instability to posterior drawer.  Patellar grind test is negative.  ROM -20 to 120    Leg length: Right leg shorter      Distal neurocirculatory exam: Pulses intact (2+ PT and DP) bilaterally, sensation intact to light touch bilaterally, EHL motor strength 5/5 bilaterally.    Skin temperature and turgor are normal throughout the lower extremities, without cyanosis, swelling, varicosities, or lymphadenopathy.    __________________________      Independent visualization of the radiographic exam:   Right knee XR demonstrates reduced left total knee arthroplasty.  This is a cruciate retaining implant.  There is a large polyp, size 22.  The components in the distal femur and proximal tibia appear well fixed.    __________________________    In summary, [name] is a 54 year old female with recent history of Left TKA dislocation.    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  The type of implant she is had placed in her revision knee arthroplasty in 2019 was a cruciate retaining implant, which relies on a competent PCL to maintain stability.  In its absence, instability results.  The mechanism of her injury, low energy hyperextension, and direction, posterior, both support an incompetent PCL as well.    We discussed we would plan for revision left total knee arthroplasty with a constrained condylar arthroplasty implant.  This will provide increased stability in both the sagittal and coronal planes.    We will plan to have her follow-up with rheumatologist regarding medication management perioperatively.    Thank you again for allowing me to participate in the care of this very pleasant patient. I will keep you posted when she returns for follow-up.    Warmest regards,    Sabino Donovan, MD, MPH  Resident, Orthopaedic Surgery  Department of Orthopaedics and Sports Medicine, [place]

## 2020-02-05 ENCOUNTER — Encounter (HOSPITAL_BASED_OUTPATIENT_CLINIC_OR_DEPARTMENT_OTHER): Payer: Self-pay | Admitting: Rheumatology

## 2020-02-05 ENCOUNTER — Ambulatory Visit: Payer: No Typology Code available for payment source | Attending: Rheumatology | Admitting: Rheumatology

## 2020-02-05 VITALS — BP 101/70 | HR 92 | Temp 97.9°F | Wt 164.0 lb

## 2020-02-05 DIAGNOSIS — M0579 Rheumatoid arthritis with rheumatoid factor of multiple sites without organ or systems involvement: Secondary | ICD-10-CM | POA: Insufficient documentation

## 2020-02-05 DIAGNOSIS — Z79899 Other long term (current) drug therapy: Secondary | ICD-10-CM | POA: Insufficient documentation

## 2020-02-05 NOTE — Progress Notes (Signed)
[facility] at the [place]  [address] [place]  [address]  TEL: [phone]  l  FAX: [phone]      02/05/2020    PRIMARY CARE PROVIDER:  None PCP  Identifies Patients Without A Pcp Or Unassigned    CONSULTING PROVIDER:  No ref. provider found              PATIENT: Autumn Camacho    [id number]    Last visit:  09/04/2019    Reason for visit:    Rheumatoid arthritis     HPI:    Diagnosis: rheumatoid arthritis (1999)   Autoimmune Serologies: Anti-CCP pos, RF pos, ANA neg   Medications Used: methotrexate, enbrel, remicade, humira, orencia, xeljanz, rinvoq.      INTERVAL HISTORY:  Patient reports her symptoms are well-controlled on rinvoq. She has been on it over a year now and denies any side effects. She has decreased meloxicam to 7.'5mg'$ /d.    She denies any significant joint pain today and does not experience morning stiffness. She still notices pain in left elbow at times. Her hip and low back pain has got better since she got a new mattress.        She is scheduled to have a left knee replacement in 03/2020.       PHYSICAL EXAMINATION:  Vital signs:  Blood pressure 101/70, pulse 92, temperature 36.6 C, temperature source Temporal, weight 74.4 kg (164 lb), SpO2 97 %.  Skin:  Skin color, texture, turgor normal. No rashes or concerning lesions on exposed skin.  HEENT:  Normocephalic. No masses, lesions, or abnormalities.   Cardiovascular:  Well-perfused. No clubbing, cyanosis, or edema.   Pulmonary/Chest:  Effort normal.   Abdominal:  Exhibits no distension.   Extremities:  Normal, without deformities, edema, or skin discoloration.   Psychiatric:  Mood, memory, affect, and judgment normal.   Neuro:  Grossly normal to observation, gait normal.  Musculoskeletal:   Hands: No tenderness or swelling, ulnar deviation in MCP joints bilaterally  Wrists: No tenderness or swelling, full ROM, negative Tinel's sign  Elbows: Tenderness in right elbow,  deformity in elbows bilaterally  Shoulders: No tenderness or swelling, full ROM  Hips: Pain with ROM in right hip, no tenderness in greater trochanter  Knees: No tenderness or swelling  Ankles: No tenderness or swelling, full ROM    Problem List:  Patient Active Problem List   Diagnosis   . Rheumatoid arthritis involving multiple sites ([place])   . Chronic right hip pain   . Rheumatoid arthritis ([place])   . Rheumatoid arthritis involving right hand ([place])   . 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 Osteoarthritis of right hip   . Osteoarthritis of right knee   . Knee dislocation, left, initial encounter   . Failed total left knee replacement (HCC)       Current Medications:   Current Outpatient Medications:   .  acetaminophen 500 MG tablet, Take 2 tablets by mouth as needed., Disp: , Rfl:   .  gabapentin 300 MG capsule, Take 300 mg in the morning and 600 mg at bedtime, Disp: 90 capsule, Rfl: 2  .  meloxicam 7.5 MG tablet, Take 1-2 tablets (7.5-15 mg) by mouth daily as needed for arthritis., Disp: 60 tablet, Rfl: 1  .  Multiple Vitamins-Minerals (MULTIVITAL  OR), Take 1 tablet by mouth daily., Disp: , Rfl:   .  upadacitinib ER (Rinvoq) 15 MG 24 hour tablet, Take 1 tablet (15 mg) by mouth daily., Disp: 90 tablet, Rfl: 1  .  valACYclovir 1 g tablet, TAKE 1 TABLET(1000 MG) BY MOUTH DAILY, Disp: 30 tablet, Rfl: 5    ALLERGIES:   Allergies as of 02/05/2020 - Reviewed 02/05/2020   Allergen Reaction Noted   . Infliximab Anaphylaxis 08/17/2018   . Codeine Other 08/17/2018     Past Medical History:   Past Medical History:   Diagnosis Date   . Chronic right hip pain 02/26/2019   . Fracture     Bilateral elbow fx   . Rheumatoid arthritis ([place])    . Rheumatoid arthritis ([place])    . Spinal stenosis      Surgical History:   Past Surgical History:   Procedure Laterality Date   . foot Bilateral     Arch surgery   . PR TONSILLECTOMY ONE-HALF <AGE 47      . PR UNLISTED PROCEDURE FEMUR/KNEE Bilateral     TKA   . PR UNLISTED PROCEDURE FOOT/TOES Bilateral 2009 R,  2010 L    Reconstructive surgery   . TKA Left 2019    revision from prior TKA     Family History:   Her family history includes Cancer in her father; Heart Attack in her father; Rheumatoid Arthritis in an other family member.  Social History:   She reports that she quit smoking about 6 years ago. Her smoking use included cigarettes. She smoked 0.00 packs per day for 20.00 years. She has never used smokeless tobacco. She reports previous alcohol use. She reports that she does not use drugs.     Social History     Social History Narrative    She moved to [place] from [place] in 04/2018.        LABS:   Results for orders placed or performed during the hospital encounter of 01/16/20   Comprehensive Metabolic Panel   Result Value Ref Range    Sodium 139 135 - 145 meq/L    Potassium 3.7 3.6 - 5.2 meq/L    Chloride 105 98 - 108 meq/L    Carbon Dioxide, Total 27 22 - 32 meq/L    Anion Gap 7 4 - 12    Glucose 105 62 - 125 mg/dL    Urea Nitrogen 15 8 - 21 mg/dL    Creatinine 0.62 0.38 - 1.02 mg/dL    Protein (Total) 6.6 6.0 - 8.2 g/dL    Albumin 3.9 3.5 - 5.2 g/dL    Bilirubin (Total) 0.3 0.2 - 1.3 mg/dL    Calcium 9.1 8.9 - 10.2 mg/dL    AST (GOT) 15 9 - 38 U/L    Alkaline Phosphatase (Total) 67 34 - 121 U/L    ALT (GPT) 14 7 - 33 U/L    eGFR, Calculated >60 >59 mL/min/[1.73_m2]    GFR, Information       Calculated GFR by CKD-EPI equation. Inaccurate with changing renal function. See [email or web address]://depts.[email or web address].html.   CBC with Diff   Result Value Ref Range    WBC 8.35 4.30 - 10.00 10*3/uL    RBC 4.17 3.80 - 5.00 10*6/uL    Hemoglobin 13.1 11.5 - 15.5 g/dL    Hematocrit 40 36 - 45 %    MCV 95 81 - 98 fL    MCH 31.4 27.3 - 33.6 pg    MCHC 33.0  32.2 - 36.5 g/dL    Platelet Count 250 150 - 400 10*3/uL    RDW-CV 12.8 11.6 - 14.4 %    % Neutrophils 75 %    % Lymphocytes 19 %    % Monocytes  6 %    % Eosinophils 0 %    % Basophils 0 %    % Immature Granulocytes 0 %    Neutrophils 6.21 1.80 - 7.00 10*3/uL    Absolute Lymphocyte Count 1.61 1.00 - 4.80 10*3/uL    Monocytes 0.49 0.00 - 0.80 10*3/uL    Absolute Eosinophil Count 0.00 0.00 - 0.50 10*3/uL    Basophils 0.02 0.00 - 0.20 10*3/uL    Immature Granulocytes 0.02 0.00 - 0.05 10*3/uL    Nucleated RBC 0.00 0.00 10*3/uL    % Nucleated RBC 0 %   Lab Add On Order   Result Value Ref Range    Lab Test Requested BMP     Specimen Type/Description Blood     Sample To Use Blood     Test Request Status Duplicate request    SARS-CoV-2 (COVID-19) Qualitative Rapid PCR   Result Value Ref Range    COVID-19 Coronavirus Qual PCR Specimen Type Nasopharyngeal Swab     COVID-19 Coronavirus Qual PCR Result None detected NDET^None detected    COVID-19 Coronavirus Qual PCR Interpretation       This is a negative result. Laboratory testing alone cannot rule out infection, particularly in the presence of clinical risk factors such as symptoms or exposure history.    COVID-19 Qualitative PCR Indication Preprocedural Surveillance    CBC   Result Value Ref Range    WBC 6.71 4.30 - 10.00 10*3/uL    RBC 4.20 3.80 - 5.00 10*6/uL    Hemoglobin 13.3 11.5 - 15.5 g/dL    Hematocrit 41 36 - 45 %    MCV 97 81 - 98 fL    MCH 31.7 27.3 - 33.6 pg    MCHC 32.8 32.2 - 36.5 g/dL    Platelet Count 206 150 - 400 10*3/uL    RDW-CV 13.2 11.6 - 14.4 %   Basic Metabolic Panel   Result Value Ref Range    Sodium 138 135 - 145 meq/L    Potassium 4.0 3.6 - 5.2 meq/L    Chloride 106 98 - 108 meq/L    Carbon Dioxide, Total 25 22 - 32 meq/L    Anion Gap 7 4 - 12    Glucose 73 62 - 125 mg/dL    Urea Nitrogen 14 8 - 21 mg/dL    Creatinine 0.65 0.38 - 1.02 mg/dL    Calcium 9.0 8.9 - 10.2 mg/dL    eGFR, Calculated >60 >59 mL/min/[1.73_m2]    GFR, Information       Calculated GFR by CKD-EPI equation. Inaccurate with changing renal function. See http://depts.[email or web address].        IMAGING/DIAGNOSTIC RESULTS:   None    IMPRESSION/RECOMMENDATIONS:   Lenise Jr is a 54 year old female with history of bilateral knee replacements who came in today for evaluation of rheumatoid arthritis. She reported her joint pain was controlled well on rinvoq and denied any side effects. She was scheduled to have left knee replacement in 03/2020. Exam today showed deformity in hands, elbows and feet which was not changed from last visit.    I discussed that the patient's condition appeared to be stable and her recent lab showed unremarkable findings. She had some pain in right elbow  and I explained it was due to damage in the joint rather than active inflammation. I advised her to maintain rinvoq for now and stop it 2 weeks before having the surgery.       Assessment:  1. Rheumatoid arthritis     PLAN:  The following was discussed with the patient.  Recommendations are as follows:  1. Continue rinvoq '15mg'$  po qd.   2. Continue meloxicam 7.'5mg'$  po qd prn.         Follow up: 6 months

## 2020-03-01 ENCOUNTER — Telehealth (INDEPENDENT_AMBULATORY_CARE_PROVIDER_SITE_OTHER): Payer: Self-pay | Admitting: Orthopaedic Surgery

## 2020-03-01 ENCOUNTER — Encounter (INDEPENDENT_AMBULATORY_CARE_PROVIDER_SITE_OTHER): Payer: Self-pay | Admitting: Orthopaedic Surgery

## 2020-03-01 NOTE — Telephone Encounter (Addendum)
Physician performing procedure: Dr. Rondall Allegra     Surgery Scheduling Logistics:   Procedure: Revision L TKA   Date: 04/16/2020  Location of Procedure: [facility]  Case Status (Inpatient, Outpatient, DSU, Addendum E): Inpatient    Diagnosis/ICD10: T84.093A  Primary CPT:  [id number]  Additional CPT(s) including possible procedures: N/A    Insurance:  [place]: [employer] Telephone Number:  [phone]  Group Number: [id number]                  Subscriber ID: [id number]    Secondary Insurance Company: [place] Telephone Number:  [phone]  Group Number: [id number]                  Subscriber ID: [id number]    Pre Authorization Needed: Yes  PA Number: Same as reference #   Authorization Status: Approved as inpatient; valid 04/16/2020-07/17/2020  Reference #: [id number]     Auth Received Date/Time: 03/08/20 2:32    Auth Received Via: Fax      Fax, or Letter scanned to media: Yes     Referral from PCP Needed: no  Second Opinion Needed:  no    Pre-Operative Surgery Requirements:    Preoperative Clearance Needed:  yes  EKG Needed:  no  Dental Clearance Needed:  no  IMCC:  no

## 2020-03-06 ENCOUNTER — Other Ambulatory Visit (INDEPENDENT_AMBULATORY_CARE_PROVIDER_SITE_OTHER): Payer: Self-pay | Admitting: Orthopaedic Surgery

## 2020-03-06 DIAGNOSIS — Z20822 Contact with and (suspected) exposure to covid-19: Secondary | ICD-10-CM

## 2020-03-06 DIAGNOSIS — T84093A Other mechanical complication of internal left knee prosthesis, initial encounter: Secondary | ICD-10-CM

## 2020-03-06 DIAGNOSIS — Z01812 Encounter for preprocedural laboratory examination: Secondary | ICD-10-CM

## 2020-03-20 ENCOUNTER — Other Ambulatory Visit (INDEPENDENT_AMBULATORY_CARE_PROVIDER_SITE_OTHER): Payer: Medicare Other

## 2020-03-20 ENCOUNTER — Ambulatory Visit (INDEPENDENT_AMBULATORY_CARE_PROVIDER_SITE_OTHER): Payer: Medicare Other | Admitting: Physician Assistant

## 2020-03-20 ENCOUNTER — Inpatient Hospital Stay (INDEPENDENT_AMBULATORY_CARE_PROVIDER_SITE_OTHER)
Admit: 2020-03-20 | Discharge: 2020-03-20 | Disposition: A | Discharge status: Home / Self Care | Payer: No Typology Code available for payment source | Source: Home / Self Care

## 2020-03-20 ENCOUNTER — Ambulatory Visit: Payer: Medicare Other

## 2020-03-20 VITALS — BP 112/80 | HR 100 | Ht 61.0 in | Wt 167.0 lb

## 2020-03-20 DIAGNOSIS — Z01818 Encounter for other preprocedural examination: Secondary | ICD-10-CM

## 2020-03-20 DIAGNOSIS — R6889 Other general symptoms and signs: Secondary | ICD-10-CM

## 2020-03-20 DIAGNOSIS — T84093A Other mechanical complication of internal left knee prosthesis, initial encounter: Secondary | ICD-10-CM

## 2020-03-20 DIAGNOSIS — Z96652 Presence of left artificial knee joint: Secondary | ICD-10-CM

## 2020-03-20 NOTE — Progress Notes (Signed)
Patient scheduled for a Rev L TKA on 04/16/2020 with Dr. Rondall Allegra. Preoperative instruction given per [facility] protocol. [facility] 'Your Surgical Experience' booklet given. Stressed NPO status ten hours prior to surgery Explained what medications to avoid prior to surgery. 4 oz bottle of chlorhexidine soap given as well as instruction for use. Discussed signs and symptoms of possible post-operative complications including infection, DVT, and constipation. The importance of pain control, refill policy on narcotics, and prophylactic antibiotic protocol also explained. No further questions or concerns at this time, patient advised to call if needed.    R/o MRSA swabs obtained in clinic. Provided instruction for prophylactic use of CHG soap for 5 days prior to surgery. Patient stated understanding of these instructions.     Patient scheduled for COVID test on 03/20/2020 at the [facility] garage at 10:05 AM. COVID order placed.     Patient already had labs done prior preop. Outpatient physical therapy referral also given at today's clinic visit.     Patient lives at home with her daughter  and partner who will be able to assist post-operatively.    Insurance    [employer]    HERITAGE PLUS    Griffin Dakin, BSN, RN  Clinic Nurse  [facility] [place] at [place], [place] [address]  Ph [phone] Fax [phone]

## 2020-03-20 NOTE — Progress Notes (Signed)
ORTHOPAEDIC HISTORY AND PHYSICAL EXAMINATION    Patient's Primary Care Physician: Terressa Koyanagi, MD   Referring Physician: No ref. provider found     CC: Severe left knee pain and instability.    HPI:  Ms. Grills is still having significant symptoms in the left knee. The pain is severe, despite already having tried bracing. We reviewed the alternatives to surgery and the risks of knee revision surgery, but she feels strongly that total knee arthroplasty is the appropriate next step.    Current Outpatient Medications   Medication Sig Dispense Refill    acetaminophen 500 MG tablet Take 2 tablets by mouth as needed.      gabapentin 300 MG capsule Take 300 mg in the morning and 600 mg at bedtime 90 capsule 2    meloxicam 7.5 MG tablet Take 1-2 tablets (7.5-15 mg) by mouth daily as needed for arthritis. 60 tablet 1    Multiple Vitamins-Minerals (MULTIVITAL OR) Take 1 tablet by mouth daily.      mupirocin 2 % ointment Apply 1 application to affected area twice a day.      upadacitinib ER (Rinvoq) 15 MG 24 hour tablet Take 1 tablet (15 mg) by mouth daily. 90 tablet 1    valACYclovir 1 g tablet TAKE 1 TABLET(1000 MG) BY MOUTH DAILY 30 tablet 5     No current facility-administered medications for this visit.        ROS:  Autumn Camacho reports no tingling, numbness, or weakness in the affected extremity.    EXAM:  Ht 5\' 1"  (1.549 m)  Wt 167 lb (75.751 kg)  Body mass index is 31.55 kg/m.     HEENT Negative  Chest clear  Coronary RRR  Abdomen soft     Exam of the left knee:On inspection, alignment is normal.  Skin exam shows a well-healed incision.  On palpation, the knee is tender around the patella.  Range of motion is 20 hyper to 125 of flexion.  Stability exam shows gross multi-directional instability.  Muscle bulk, power, and tone are markedly atrophic.  Patellar grind and compression is  negative.  From the 02/02/20.    Distal Neurocirculatory Exam: Pulses intact (2+ PT and DP) bilaterally, sensation intact to  light touch bilaterally, EHL motor strength 5/5 bilaterally.     XRAYS: Advanced DJD left knee    IMPRESSION: Severe left knee DJD.    PLAN:   Following our discussion of options, Autumn Camacho decided that a left total knee arthroplasty seems a reasonable option. We discussed the pre-op process, technical elements of the procedure itself, and typical aftercare arrangements.    We discussed that TKA using the traditional approach is usually a reliable procedure, with published success rates in excess of 90% at 10 years, but that there are a number of risks associated with the procedure. We reviewed the risks of TKA today. I explained that these risks include (but are not limited to): infection, bleeding, nerve injury, thromboembolic disease, medical/anesthetic risks including death, severe cardiac events, and stroke/cerebral impairment, as well as knee-specific risks such as stiffness, instability, limp or abnormal gait, persistent pain, and possible need for re-operation or revision surgery, either early or later on in life.      We also discussed the pros and cons of the so-called less-invasive (sometimes called MIS) approach to knee replacement. I think it's a reasonable option here, and it may shorten the recovery period after surgery, but we discussed that there are no long-term data on  it, and so this has to be considered a limitation of this approach. Our short-term results have been published in the Journal of Bone and Joint Surgery (a leading peer-reviewed journal), and have been gratifying.    We discussed that she should not expect the knee to become entirely pain-free, as the literature suggests that as many as 10-20% of knee replacement patients have persistent pain and/or persistent limitations on activities even after full recovery.    We also discussed that she will need help (24/7) when she returns home from the hospital for the first week to 10 days.  She assures [place] that she has made appropriate  arrangements.      I spent some time answering Autumn Camacho's questions, and made no guarantees as to outcome.  She desires to proceed. We will begin the pre-operative process per our usual protocol.    This will include:  1. Medicine Consult: PCP      2. Anesthesia PSC Visit: Yes    3. Autologous Blood: No    4. Implants and Special Equipment: Zimmer Revision, CCK.    5. DVT  and PE prophylaxis: Aspirin 81 mg every 12 hours for 6 weeks starting POD #1.  SCD's and TED hose to be applied upon arrival to hospital.  PEPPER trial may change the plan.     6. Routine labs.    Norina Buzzard, PA-C  Teaching Associate  Dept of Orthopaedics and Sports Medicine  Joint Replacement/Hip and Knee Arthritis   [facility]

## 2020-03-20 NOTE — Patient Instructions (Addendum)
Preop visit main points    Surgery Type: Revision left total knee replacement on 04/16/2020    - COVID appointment: 04/14/2020 at the [facility] garage at 10:05 AM    - Medicines to stop taking before surgery: 7 days: See handout 3 days: NSAIDs such as your meloxicam and any other OTC NSAIDs such as Ibuprofen or Aleve    - Pre Anesthesia Phone call: 04/08/2020 at 9:30 AM They will notify you of your surgery start time, when to check in, and medications to hold the night before and morning of surgery.     - Take a daily shower using a small amount of antiseptic soap for 6 days (last shower will be the morning of surgery). Start date: 04/11/2020    - Physical Therapy referral given today: First appointment should be 4-7 days after surgery.    - Pain Management: 2 business day refill policy. Please call clinic to request refills. If approved, prescriptions can be electronically sent to your preferred pharmacy.   - Elevate and ice surgical leg frequently to help with swelling post-operatively. Toes need to be above the level of your heart. Ice pack to incision site as frequently as 20 minutes on/20 minutes off.    - Blood Clot Prevention: You will be prescribed a blood thinner at discharge, take as instructed to prevent risk of blood clot after surgery.   - Wear compression socks at least until your first post op appointment, or longer as directed by your surgeon, to help with swelling post-operatively.     - Infection Prevention: Keep incision clean to prevent infection. You will receive information at the hospital about how to care for your incision and when you can shower.     - Constipation prevention!    - Call or send a message via MyChart with any questions or concerns. If you have any urgent needs and need a call back the same day, please do not leave a voicemail message on my direct line. Call the clinic to speak to someone directly. Please reference your medical record number when leaving me a voicemail message:  [id number].

## 2020-03-21 ENCOUNTER — Telehealth (INDEPENDENT_AMBULATORY_CARE_PROVIDER_SITE_OTHER): Payer: Self-pay | Admitting: Orthopaedic Surgery

## 2020-03-21 LAB — R/O MRSA

## 2020-03-21 NOTE — Telephone Encounter (Signed)
Talked with patient and let her know per Dr. Catalina Gravel, she will need to hold her Rinvoq 1 week before and 1 week after surgery.    Patient stated her understanding and expressed appreciation of the call back.

## 2020-03-27 ENCOUNTER — Encounter (HOSPITAL_BASED_OUTPATIENT_CLINIC_OR_DEPARTMENT_OTHER): Payer: Medicare Other | Admitting: Rehabilitative and Restorative Service Providers"

## 2020-04-01 ENCOUNTER — Telehealth (HOSPITAL_COMMUNITY): Payer: Self-pay

## 2020-04-01 HISTORY — PX: PR ANES; TOTAL KNEE REPLACEMENT: AN27447

## 2020-04-01 NOTE — Telephone Encounter (Signed)
Current Outpatient Medications:     acetaminophen 500 MG tablet, Take 2 tablets by mouth daily as needed. , Disp: , Rfl:     gabapentin 300 MG capsule, Take 300 mg in the morning and 600 mg at bedtime, Disp: 90 capsule, Rfl: 2    meloxicam 7.5 MG tablet, Take 1-2 tablets (7.5-15 mg) by mouth daily as needed for arthritis., Disp: 60 tablet, Rfl: 1    mupirocin 2 % ointment, Apply 1 application topically 2 times a day as needed. , Disp: , Rfl:     upadacitinib ER (Rinvoq) 15 MG 24 hour tablet, Take 1 tablet (15 mg) by mouth daily., Disp: 90 tablet, Rfl: 1    valACYclovir 1 g tablet, TAKE 1 TABLET(1000 MG) BY MOUTH DAILY, Disp: 30 tablet, Rfl: 5    Medlist reviewed by RX via patient and records from [facility] [phone].

## 2020-04-08 ENCOUNTER — Ambulatory Visit: Payer: Medicare Other

## 2020-04-08 ENCOUNTER — Other Ambulatory Visit: Payer: Self-pay

## 2020-04-08 ENCOUNTER — Encounter (HOSPITAL_COMMUNITY): Payer: Self-pay

## 2020-04-08 DIAGNOSIS — Z01818 Encounter for other preprocedural examination: Secondary | ICD-10-CM | POA: Insufficient documentation

## 2020-04-08 NOTE — Anesthesia Preprocedure Evaluation (Signed)
Patient: Autumn Camacho    Procedure Information     Date/Time: 04/16/20 1100    Procedure: REVISION, TOTAL ARTHROPLASTY, KNEE, ALL COMPONENTS (Left Knee) - Zimmer Persona CCK versus RHK.    Location: [facility] MAIN OR    Surgeons: Amparo Bristol, MD        HPI: Copied from Dr Lennox Grumbles progress note 02/02/20:      Ms. Peifer is a 54 year old female with a 2 week long history of Left knee instability s/p Left TKA dislocation on 01/16/2020 managed with closed reduction at [facility] ED. her mechanism of injury was stretching in bed resulting in slight hyper extension of the left knee.  She was admitted overnight for compartment checks and arterial duplex. All evaluation in hospital was reassuring for no concern for vascular injury or neurologic injury. She was discharged with a knee immobilizer and comes to clinic today for discussion of future management.    Patient has rheumatoid arthritis which is managed by her rheumatologist with oral DMARD medications.  She has a history of bilateral total knee arthroplasties.  Her right knee total arthroplasty was in July 2018.  She had a left knee arthroplasty in May 2018.  It remained unstable postoperatively and was revised in June 2019.  This was all done at [facility] in [place].      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 Authorizing Provider   acetaminophen 500 MG tablet Take 2 tablets by mouth daily as needed.     PROVIDER, HISTORICAL   gabapentin 300 MG capsule Take 300 mg in the morning and 600 mg at bedtime 01/16/20   Ardith Dark, MD   meloxicam 7.5 MG tablet Take 1-2 tablets (7.5-15 mg) by mouth daily  as needed for arthritis. 12/15/19   Julaine Hua, MD   Multiple Vitamins-Minerals (MULTIVITAL OR) Take 1 tablet by mouth daily.    PROVIDER, HISTORICAL   mupirocin 2 % ointment Apply 1 application topically 2 times a day as needed.  05/24/19   PROVIDER, HISTORICAL   upadacitinib ER (Rinvoq) 15 MG 24 hour tablet Take 1 tablet (15 mg) by mouth daily. 11/15/19   Julaine Hua, MD   valACYclovir 1 g tablet TAKE 1 TABLET(1000 MG) BY MOUTH DAILY  Patient taking differently: Take 1,000 mg by mouth every morning.  05/17/19   Julaine Hua, MD       Review of patient's allergies indicates:  Allergies   Allergen Reactions    Fentanyl Other     Per patient, she has "forgotten to breathe"    Hydromorphone JQ:BHALPF/[id number]    Infliximab Anaphylaxis    Codeine Other     "Feels like her skin is crawling"       Social History:     Medical History and Review of Systems    Source of information: Phone evaluation and Chart review.  Previous anesthesia: Yes (general)    History of anesthetic complications  (-) History of anesthetic complications.    Functional Status   Unable to exercise due to physical limitation.    Pulmonary  Neg pulmonary ROS    Neuro/Psych   Neg neuro/psych ROS    Cardiovascular   Neg cardio ROS    HEENT   Neg HEENT ROS    Musculoskeletal   (+) osteoarthritis (RA)    Skin   negative skin ROS    GI/Hepatic/Renal   neg GI/hepatic/renal ROS    Endo/Immunology   neg endo/other ROS    Hematology   negative hematology ROS  Oncology   negative hematology/oncology ROS         [facility] ANES PHYSICAL EXAM     Labs:     Relevant procedures / diagnostic studies:          Risk Calculators / Scores:

## 2020-04-08 NOTE — Progress Notes (Signed)
Ortho Pre-Admission Data   Surgeon Name: Manner  Surgical Procedure: Revision left TKA  Surgery Date:  04/16/20    Information Source   Chart Review   Patient     Prior Functional / Medical Information   Medical History   Right total knee arthroplasty, July 2018  Left total knee arthroplasty, May 2018  Left revision total knee arthroplasty, June 2019   RA  Lumbar back pain  01/16/20 dislocation left knee requiring treatment the the ED    Previous Functional Level   Uses a cane and a knee immobilizer  DME needs:  Already owns a walker and will bring it to the hospital.    Current Decision Maker   Patient     Cognitive Status   Alert   Oriented x3     Pre-Admission Living Situation  Lives with her husband, Mali Hayton [phone]  Architectural Considerations / Barriers   House: 3 steps in/ will stay on the first floor  Caregiver Support available for 1 week at D/C?   husband, Mali Hayton [place]  Discharge Address   Will return to [facility] - therapist: Meredith Mody. Patient is a current client.  Transportation to / from [facility]   husband, Mali Hayton [phone]

## 2020-04-08 NOTE — Preprocedure Instructions (Signed)
DOS:   04/16/20      [facility] ( [address] , [place]. [address])    ARRIVE @ 9:00  AM     FOR  START TIME  11:00 AM   PARKING LOT F BY THE TOTEM POLE         NO SOLID FOODS AFTER MIDNIGHT  AND THE MORNING OF THE PROCEDURE   TAKE YOUR MORNING MEDICATIONS  WITH SIPS OF WATER ONLY , ENOUGH TO SWALLOW THE PILLS  VALACYCLOVIR, GABAPENTIN     TAKE  FIVE  SHOWER OR  AS INSTRUCTED PER MD , THE LAST SHOWER IS THE MORNING OF THE PROCEDURE. WITH THE ANTIBACTERIAL SOAP GIVEN.     DO NOT APPLY ANY CREAM ,  NO MAKE UP , LOTIONS DEODERANT TO YOUR SKIN OR HAIR PRODUCTS THE DAY OF SURGERY  AND NO SHAVING  THE MORNING OF THE PROCEDURE     NO CANDY OR GUM  IN YOUR MOUTH DAY OF SURGERY     BRUSH TEETH DAY OF SURGERY  WITH  TOOTH PASTE AND MOUTH [place]     REMEMBER TO BRING WITH YOU YOUR PHOTO ID  AND INSURANCE CARD DAY OF SURGERY.   LEAVE ALL JEWELRY AND VALUABLES AT HOME  INCLUDING BODY PIERCING     YOUR COVID TEST IS SCHEDULED AT [facility] THE FIRST FLOOR GARAGE  ARRIVE ON 04/14/20  AT 10:05 AM       PARKING IS  FREE THE FIRST 20 MINUTES , THEN IS PRO-RATED UP TO  $10.00 ALL DAY     PLEASE CALL THE PRE ANESTHESIA CLINIC IF YOU HAVE ANY QUESTIONS AT [phone]     Pre-Surgery Instructions:   Medication Instructions    acetaminophen 500 MG tablet TAKE AS NEEDED    gabapentin 300 MG capsule TAKE DAY OF SURGERY WITH SIPS OF WATER     meloxicam 7.5 MG tablet PER MD INSTRUCTIONS     Multiple Vitamins-Minerals (MULTIVITAL OR) HOLD 24 HOURS PRE OP     mupirocin 2 % ointment HOLD DOS     upadacitinib ER (Rinvoq) 15 MG 24 hour tablet PER MD INSTRUCTIONS     valACYclovir 1 g tablet TAKE DAY OF SURGERY WITH SIPSOF WATER

## 2020-04-09 ENCOUNTER — Encounter (INDEPENDENT_AMBULATORY_CARE_PROVIDER_SITE_OTHER): Payer: Self-pay | Admitting: Orthopaedic Surgery

## 2020-04-10 ENCOUNTER — Other Ambulatory Visit (HOSPITAL_BASED_OUTPATIENT_CLINIC_OR_DEPARTMENT_OTHER): Payer: Self-pay | Admitting: Rheumatology

## 2020-04-10 DIAGNOSIS — M069 Rheumatoid arthritis, unspecified: Secondary | ICD-10-CM

## 2020-04-11 ENCOUNTER — Encounter (INDEPENDENT_AMBULATORY_CARE_PROVIDER_SITE_OTHER): Payer: Self-pay | Admitting: Physician Assistant

## 2020-04-11 ENCOUNTER — Other Ambulatory Visit (HOSPITAL_BASED_OUTPATIENT_CLINIC_OR_DEPARTMENT_OTHER): Payer: Self-pay | Admitting: Family Medicine

## 2020-04-11 DIAGNOSIS — Z20822 Contact with and (suspected) exposure to covid-19: Secondary | ICD-10-CM

## 2020-04-11 DIAGNOSIS — Z01812 Encounter for preprocedural laboratory examination: Secondary | ICD-10-CM

## 2020-04-11 MED ORDER — RINVOQ 15 MG OR TB24
15.0000 mg | EXTENDED_RELEASE_TABLET | Freq: Every day | ORAL | 0 refills | Status: DC
Start: 2020-04-11 — End: 2020-07-10

## 2020-04-14 ENCOUNTER — Other Ambulatory Visit
Admission: RE | Admit: 2020-04-14 | Discharge: 2020-04-14 | Disposition: A | Discharge status: Home / Self Care | Payer: No Typology Code available for payment source | Source: Ambulatory Visit | Attending: Family Medicine | Admitting: Family Medicine

## 2020-04-14 ENCOUNTER — Encounter (HOSPITAL_BASED_OUTPATIENT_CLINIC_OR_DEPARTMENT_OTHER): Payer: Medicare Other

## 2020-04-14 DIAGNOSIS — Z01812 Encounter for preprocedural laboratory examination: Secondary | ICD-10-CM | POA: Insufficient documentation

## 2020-04-14 DIAGNOSIS — Z20822 Contact with and (suspected) exposure to covid-19: Secondary | ICD-10-CM | POA: Insufficient documentation

## 2020-04-14 LAB — COVID-19 CORONAVIRUS QUALITATIVE PCR: COVID-19 Coronavirus Qual PCR Result: NOT DETECTED

## 2020-04-15 ENCOUNTER — Other Ambulatory Visit (HOSPITAL_BASED_OUTPATIENT_CLINIC_OR_DEPARTMENT_OTHER): Payer: Self-pay

## 2020-04-15 DIAGNOSIS — M792 Neuralgia and neuritis, unspecified: Secondary | ICD-10-CM

## 2020-04-15 MED ORDER — GABAPENTIN 300 MG OR CAPS
ORAL_CAPSULE | ORAL | 2 refills | Status: DC
Start: 2020-04-15 — End: 2020-11-15

## 2020-04-15 NOTE — Telephone Encounter (Signed)
Medication: Gabapentin  Requested by: Patient    Last refill written: 01/16/2020  Written by: Dr Doran Clay    Last appointment: 11/26/2019 with Dr. Doran Clay  Scheduled follow up: Visit date not found      [place] PMP Medication Dispense History (from 04/21/2019 to 04/15/2020)     Dispensed Days Supply Quantity   GABAPENTIN 300 MG CAPSULE 06/16/2019 30 60 unspecified   GABAPENTIN 300 MG CAPSULE 06/16/2019 30 60 unspecified   GABAPENTIN 300 MG CAPSULE 06/16/2019 30 60 unspecified

## 2020-04-16 ENCOUNTER — Telehealth (HOSPITAL_BASED_OUTPATIENT_CLINIC_OR_DEPARTMENT_OTHER): Payer: Self-pay | Admitting: Rehabilitative and Restorative Service Providers"

## 2020-04-16 ENCOUNTER — Telehealth (INDEPENDENT_AMBULATORY_CARE_PROVIDER_SITE_OTHER): Payer: Self-pay | Admitting: Orthopaedic Surgery

## 2020-04-16 ENCOUNTER — Encounter (INDEPENDENT_AMBULATORY_CARE_PROVIDER_SITE_OTHER): Payer: Self-pay | Admitting: Orthopaedic Surgery

## 2020-04-16 ENCOUNTER — Inpatient Hospital Stay (HOSPITAL_COMMUNITY): Admission: RE | Admit: 2020-04-16 | Payer: Medicare Other | Source: Home / Self Care | Admitting: Orthopaedic Surgery

## 2020-04-16 ENCOUNTER — Encounter (HOSPITAL_COMMUNITY): Payer: Self-pay | Admitting: Certified Registered"

## 2020-04-16 ENCOUNTER — Other Ambulatory Visit (HOSPITAL_BASED_OUTPATIENT_CLINIC_OR_DEPARTMENT_OTHER): Payer: Self-pay | Admitting: Rheumatology

## 2020-04-16 ENCOUNTER — Encounter (HOSPITAL_COMMUNITY): Payer: Self-pay

## 2020-04-16 ENCOUNTER — Encounter (HOSPITAL_COMMUNITY): Admission: RE | Payer: Self-pay | Source: Home / Self Care

## 2020-04-16 DIAGNOSIS — M2352 Chronic instability of knee, left knee: Secondary | ICD-10-CM

## 2020-04-16 DIAGNOSIS — T84093S Other mechanical complication of internal left knee prosthesis, sequela: Secondary | ICD-10-CM

## 2020-04-16 DIAGNOSIS — M0579 Rheumatoid arthritis with rheumatoid factor of multiple sites without organ or systems involvement: Secondary | ICD-10-CM

## 2020-04-16 SURGERY — REVISION, TOTAL ARTHROPLASTY, KNEE, ALL COMPONENTS
Anesthesia: Spinal | Site: Knee | Laterality: Left

## 2020-04-16 MED FILL — Tranexamic Acid-Sodium Chloride IV Soln 1000 MG/100ML-0.7%: INTRAVENOUS | Qty: 100 | Status: AC

## 2020-04-16 MED FILL — Tranexamic Acid-Sodium Chloride IV Soln 1000 MG/100ML-0.7%: INTRAVENOUS | Qty: 100 | Status: CN

## 2020-04-16 SURGICAL SUPPLY — 25 items
ADHESIVE SKIN DERMABOND PRINEO 60CM 3.8 ML (Closure Device) IMPLANT
APPLICATOR PREP CHLORAPREP 26ML HI-LITE ORANGE 2% CHG (Prep) IMPLANT
BLADE SAGITTAL DE SOUTTER RECIPROCATING DRIVE 12 X 80 X 1.27MM (Blade) IMPLANT
BLADE SAGITTAL DE SOUTTER WEBLITE 19 X 90 X 1.27MM (Blade) IMPLANT
BLADE SAW 90MMX13MM THK1MM (Blade) IMPLANT
BONE CEMENT REVOLUTION MIX (Delivery System) IMPLANT
CATHETER TRAY SURESTEP 16FR URINE METER (Catheter) IMPLANT
COVER FLEX SNAP ON STERILE LF SURGICLICK LIGHT (Drape) IMPLANT
DEVICE CLOSURE V-LOC 180 GS-21 SZ2-0 12IN GREEN (Suture)
DEVICE CLOSURE V-LOC 180 GS-25 SZ0 24IN GREEN (Suture) IMPLANT
DRESSING PRIMAPORE 8INX4IN HIGH ABSORB PAD ACRYL (Dressing) IMPLANT
HOOD SURGEON FLYTE STERI-SHIELD PEELAWAY FACE SHIELD (Gown) IMPLANT
LINEN GOWN XL (Gown) IMPLANT
LINEN PACK SURGICAL (Other) IMPLANT
LINER POSITIONER ALVARADO STERILE DISP (Other) IMPLANT
NEEDLE HYPODERMIC SAFETY 21GA 1-1/2IN MAGELLAN (Needle) IMPLANT
NEEDLE SUTURE RICHARD-ALLAN 5 1.482IN REG EYE (Needle) IMPLANT
PACK CUST TOTAL KNEE (Pack) IMPLANT
PAD ESURG GRNDING UNIV PREATTACH CORD SPLIT (Other) IMPLANT
SET INTERPULSE SUCTION TUBE HIGH FLOW TIP (Other) IMPLANT
SUTURE DEVICE CLOSURE V-LOC 180 GS-21 SZ2-0 12IN GREEN (Suture) IMPLANT
SUTURE STRATAFIX 3-0 FS-2 14CM UNDYED (Suture) IMPLANT
SUTURE STRATAFIX SPIRAL PDO 36CM 2-0 MH VIOLET (Suture) IMPLANT
SYRINGE BD LUER 20ML CONCENTRIC TIP (Syringe) IMPLANT
TUBE SUCTION KAMVAC MINI (Tubing) IMPLANT

## 2020-04-16 NOTE — Telephone Encounter (Addendum)
Pt. called regarding her cancelled Sx this morning due to bed shortage at the hospital. New DOS will most likely be 05/01/20 per Dr. Catalina Gravel but I will call her to confirm once I have more info.

## 2020-04-16 NOTE — Telephone Encounter (Addendum)
I called pt. back to let her know her new DOS will be 05/01/20; confirmed date ok per Dr. Catalina Gravel. I informed her RN is mailing her new soap and that it is ok to use only on her leg/knee area prior to Sx. Pt. also asked about her medications and I confirmed with RN and relayed to pt. to hold Meloxicam 3 days prior to Sx and per Dr. Catalina Gravel, she will need to hold her Rinvoq 1 week before and 1 week after surgery. Her COVID test and MRSA swabs will be re-done with RN Monday prior to Sx in our clinic on 04/29/20. She will sign new consent forms DOS at the hospital and do a telemedicine appt with Dr. Catalina Gravel on 04/18/20 for an updated H&P; her pre-op was done 03/20/20. Pt. understood and had no further questions.

## 2020-04-16 NOTE — Telephone Encounter (Signed)
RETURN CALL: Voicemail - Detailed Message      SUBJECT:  Cancellation/Reschedule Request     REASON: Surgery date has been moved back.    ADDITIONAL INFORMATION: Patient really only wants to see Gwen and CCR not permitted to override the "Return long - 45 min appointments" to a new patient slot.   Please consult with the provider and see if she will allow the patient to still establish with her due to her surgery being rescheduled.       Patient's target date for treatment is the week of 05-06-20.

## 2020-04-16 NOTE — H&P (Signed)
Rescheduled from 6/29

## 2020-04-16 NOTE — Telephone Encounter (Signed)
Called patient but no answer. Once patient calls back, please transfer to nurse.    Will advise patient OK to use CHG soap only on lower extremities. May use OTC antibacterial soap like Dial brand on the rest of the body. Will also ask patient if she wants the soap mailed to her or if she wants to pick it up in clinic.

## 2020-04-17 ENCOUNTER — Encounter (INDEPENDENT_AMBULATORY_CARE_PROVIDER_SITE_OTHER): Payer: Self-pay

## 2020-04-17 MED ORDER — MELOXICAM 7.5 MG OR TABS
7.5000 mg | ORAL_TABLET | Freq: Every day | ORAL | 3 refills | Status: DC | PRN
Start: 2020-04-17 — End: 2020-05-02

## 2020-04-17 NOTE — Telephone Encounter (Signed)
02/05/20 [place], Rheumatology   Continue meloxicam 7.5mg  po qd prn.     Follow up: 6 months   Julaine Hua, MD    Consistent on med per reconcile dispense report #30/month

## 2020-04-18 ENCOUNTER — Telehealth (INDEPENDENT_AMBULATORY_CARE_PROVIDER_SITE_OTHER): Payer: No Typology Code available for payment source | Admitting: Orthopaedic Surgery

## 2020-04-18 DIAGNOSIS — M0579 Rheumatoid arthritis with rheumatoid factor of multiple sites without organ or systems involvement: Secondary | ICD-10-CM

## 2020-04-18 DIAGNOSIS — M2352 Chronic instability of knee, left knee: Secondary | ICD-10-CM

## 2020-04-18 DIAGNOSIS — T84093S Other mechanical complication of internal left knee prosthesis, sequela: Secondary | ICD-10-CM

## 2020-04-18 NOTE — Telephone Encounter (Signed)
Talked with patient and she explained that she developed "possible yeast infection" on her vaginal when using CHG soap on her upper extremities, then rinsing it with water and runs down to her vaginal.     I let patient know I will send a new CHG soap to her but use only on her lower extremities once a day for 5 days before surgery and the morning of surgery. I instructed her to use an OTC antibacterial soap for her upper extremities.     Patient stated her understanding and requested to mail CHG soap.     Address on file verified with the patient.       CHG soap     Address Mailed To:   [address]  [place] [address]    Date Mailed (M/D/Year): 04/18/2020

## 2020-04-18 NOTE — Progress Notes (Signed)
Distant Site Telemedicine Encounter    I conducted this encounter from [facility] via secure, live, face-to-face video conference with the patient. Autumn Camacho was located at home.  Prior to the interview, the risks and benefits of telemedicine were discussed with the patient and verbal consent was obtained.      We reviewed the planned surgery, which will encompass revision of the left knee to either a CCK or a rotating hinge.  We reviewed postoperative care, including PT and resumption of her anti-RA meds.  She will be coming in 2 days prrio to surgery for MRSA and COVID screening.      She is ready to proceed.    Rondall Allegra, MD  [facility]  Hip and Knee Arthritis  Professor  Department of Orthopaedics and [place] [facility] [place]

## 2020-04-23 ENCOUNTER — Encounter (HOSPITAL_BASED_OUTPATIENT_CLINIC_OR_DEPARTMENT_OTHER): Payer: Medicare Other | Admitting: Rehabilitative and Restorative Service Providers"

## 2020-04-24 ENCOUNTER — Other Ambulatory Visit: Payer: Self-pay

## 2020-04-25 ENCOUNTER — Other Ambulatory Visit: Payer: Self-pay

## 2020-04-25 ENCOUNTER — Ambulatory Visit: Payer: No Typology Code available for payment source | Attending: Orthopaedic Surgery

## 2020-04-25 ENCOUNTER — Encounter (HOSPITAL_COMMUNITY): Payer: Self-pay

## 2020-04-25 DIAGNOSIS — T84093S Other mechanical complication of internal left knee prosthesis, sequela: Secondary | ICD-10-CM | POA: Insufficient documentation

## 2020-04-25 NOTE — Anesthesia Preprocedure Evaluation (Addendum)
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 her mechanism of injury was stretching in bed resulting in slight hyper extension of the left knee.  She was admitted overnight for compartment checks and arterial duplex. All evaluation in hospital was reassuring for no concern for vascular injury or neurologic injury. She was discharged with a knee immobilizer and comes to clinic today for discussion of future management.    Patient has rheumatoid arthritis which is managed by her rheumatologist with oral DMARD medications.  She has a history of bilateral total knee arthroplasties.  Her right knee total arthroplasty was in July 2018.  She had a left knee arthroplasty in May 2018.  It remained unstable postoperatively and was revised in June 2019.  This was all done at [facility] in [place].    Autumn Camacho principal limitation is inability to ambulate without the support of a knee brace given gross instability of the left knee, and so she presents for evaluation.    Functional history reveals that she can walk indoors only before needing to stop or rest.  Autumn Camacho uses the aid of a cane for ambulation.  Autumn Camacho does stairs needing a banister, one step at a time, and can sit in any chair, at least 1 hour.  Autumn Camacho arises from a chair needing arm assistance, and can reach socks and shoes with difficulty.    On a visual-analogue pain scale, she rates the pain as a 0 out of 10 in severity at rest, and as a 1 out of 10 in severity with weight bearing; she has tried activity modifications to relieve the pain  with limited success.    Autumn Camacho has the following drug allergies: Infliximab and Codeine    Autumn Camacho past medical history is significant for rheumatoid arthritis and lumbar stenosis    Autumn Camacho has a current medication list which includes the following prescription(s): acetaminophen, gabapentin, meloxicam, multiple vitamins-minerals, rinvoq, and valacyclovir.      Relevant Problems   Other   (+) Arthritis of carpometacarpal (CMC) joints of both thumbs   (+) Rheumatoid arthritis (HCC)   (+) Rheumatoid arthritis involving multiple sites (HCC)   (+) Rheumatoid arthritis involving right hand (HCC)      Rheumatoid arthritis involving multiple sites ([place])   . Chronic right hip pain   . Rheumatoid arthritis ([place])   . Rheumatoid arthritis involving right hand ([place])   . 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 Osteoarthritis of right hip   . Osteoarthritis of right knee   . Knee dislocation, left, initial encounter   . 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 Authorizing Provider   acetaminophen 500 MG tablet Take 2 tablets by mouth daily as needed.     PROVIDER, HISTORICAL   gabapentin 300 MG capsule Take 300 mg in the morning and 600 mg at bedtime 04/15/20   Ardith Dark, MD   meloxicam 7.5 MG tablet Take 1 tablet (7.5 mg) by mouth daily as needed for arthritis. 04/17/20   Julaine Hua, MD   Multiple Vitamins-Minerals (MULTIVITAL OR) Take 1 tablet by mouth daily.    PROVIDER, HISTORICAL   mupirocin 2 % ointment Apply 1 application topically 2 times a day as needed.  05/24/19   PROVIDER, HISTORICAL   upadacitinib ER (Rinvoq) 15 MG 24  hour tablet Take 1 tablet (15 mg) by mouth daily. 04/11/20   Julaine Hua, MD   valACYclovir 1 g tablet TAKE 1 TABLET(1000 MG) BY MOUTH DAILY  Patient taking differently: Take 1,000 mg by mouth every morning.  05/17/19   Julaine Hua, MD       Review of patient's allergies indicates:  Allergies   Allergen Reactions   . Fentanyl Other     Per patient, she has "forgotten to breathe"   . Hydromorphone JS:HFWYOV/ZCHYIFOY   . Infliximab Anaphylaxis   . Codeine Other     "Feels like her skin is crawling"       Social History:     Medical History and Review of Systems    Source of information: Phone evaluation, Chart review and In person visit.  Previous anesthesia: Yes (general)    History of anesthetic complications  (-) History of anesthetic complications.    Functional Status Uses walker to ambulate  Unable to exercise due to physical limitation, able to climb 1 flight of stairs without stopping and able to lay flat and still for 30 minutes.    Pulmonary   Neg pulmonary ROS    Neuro/Psych   (+) chronic pain    Cardiovascular   Neg cardio ROS    HEENT   (+) wears glasses    Musculoskeletal Rheumatoid arthritis; per rheumatology note: Patient reports her symptoms are well-controlled on rinvoq  (+) osteoarthritis  (+) cervical spine disease    GI/Hepatic/Renal   neg GI/hepatic/renal ROS    Endo/Immunology   neg endo/other ROS    Hematology   negative hematology ROS  Oncology   negative hematology/oncology ROS         Physical Exam  Airway  Mallampati:  I  Upper Lip Bite Test: I  TM distance:  >6 cm  Neck ROM:  Full  Mouth Opening:  Normal    Dental  normal      Cardiovascular  Rhythm:  Regular  Rate:  Normal    Pulmonary  normal             Labs: (last year)    BMP  CBC/Coags   Na 138 01/17/2020  Hb 13.3 01/17/2020   K 4.0 01/17/2020  HCT 41 01/17/2020   Cl 106 01/17/2020  WBC 6.71 01/17/2020   HCO3 25 01/17/2020  PLT 206 01/17/2020   BUN 14 01/17/2020  INR - -   Cr 0.65 01/17/2020  PT - -   Glu 73 01/17/2020  PTT - -       Misc    eGFR >60 01/17/2020  MCV 97 01/17/2020   A1C - -  BNP - -       LFTs   AST 15 01/16/2020  Albumin 3.9 01/16/2020   ALT 14  01/16/2020  Protein 6.6 01/16/2020   Alk Phos 67 01/16/2020  T Bili 0.3 01/16/2020         Relevant procedures / diagnostic studies:     Anesthesia Plan    PAT Discussion  PAT Anesthesia Plan: regional    Supervising Provider - Day of Procedure  ASA 3     Planned Anesthetic Type: regional  Code status discussed for perioperative care: Yes  Anesthetic plan and risks discussed with patient.  Use of blood products discussed with patient who consented to blood products.         Risk Calculators / Scores:

## 2020-04-25 NOTE — Preprocedure Instructions (Signed)
Pre-Surgery Instructions:   Medication Instructions   . acetaminophen 500 MG tablet Take as needed   . gabapentin 300 MG capsule Take on DOS   . meloxicam 7.5 MG tablet Held off per MD instruction   . Multiple Vitamins-Minerals (MULTIVITAL OR) Held off per MD instruction   . upadacitinib ER (Rinvoq) 15 MG 24 hour tablet Held off per MD instruction   . valACYclovir 1 g tablet Take as needed     Arrive on 05/01/2020 at [facility] entrance [phone], parking lot F) at [place] am for your 0920 am surgery.  Surgery length is 140 minutes plus approximately 2 hours in post recovery. If being admitted, you can anticipate being taken to your room 1-2 hours after surgery completed. Usual discharge home is around 11:00 am.    Do not eat or drink anything after midnight. No gums, candies or mints.    Shower the evening before and the morning of surgery (unless otherwise instructed by physician).  Use antibacterial soap if provided by the surgeon.  After your shower please do not put any products on the skin or hair including; lotions, powders, deodorant, antiperspirant, aftershave, face cream, makeup, conditioners, gel, mouse or hairspray. No dark nail polish.    You may brush your teeth, swish and spit everything out.    Wear loose comfortable clothing. You may wish to wear a button style shirt if your surgery is above the waist.     Please leave all jewelry at home, including wedding rings, watches, necklaces and all piercings.  Plastic spacers are okay-except in mouth or in surgical area.      Please bring eyeglass case if you have one.  You may not wear contact lenses during surgery.    If spending night, you may bring a few toiletries (like toothbrush, brush, phone charger).    Only bring medications/items from home if directed by nurse (like eyedrops, inhalers, or CPAPS)    Bring your insurance card and photo identification day of surgery    Parking is $10 per day.  In/out of campus under 30  minutes is free.    For questions to Pre-Anesthesia, please call [phone]. For issues/concern on day of surgery, please call Pre-Surgical Admit [phone].    Spent 20 minutes with patient.

## 2020-04-26 ENCOUNTER — Encounter (INDEPENDENT_AMBULATORY_CARE_PROVIDER_SITE_OTHER): Payer: Medicare Other | Admitting: Physician Assistant

## 2020-04-29 ENCOUNTER — Ambulatory Visit (INDEPENDENT_AMBULATORY_CARE_PROVIDER_SITE_OTHER): Payer: Medicare Other

## 2020-04-29 ENCOUNTER — Ambulatory Visit: Payer: No Typology Code available for payment source | Attending: Orthopaedic Surgery

## 2020-04-29 DIAGNOSIS — Z01812 Encounter for preprocedural laboratory examination: Secondary | ICD-10-CM | POA: Insufficient documentation

## 2020-04-29 DIAGNOSIS — R6889 Other general symptoms and signs: Secondary | ICD-10-CM | POA: Insufficient documentation

## 2020-04-29 DIAGNOSIS — Z20822 Contact with and (suspected) exposure to covid-19: Secondary | ICD-10-CM | POA: Insufficient documentation

## 2020-04-29 DIAGNOSIS — Z01818 Encounter for other preprocedural examination: Secondary | ICD-10-CM

## 2020-04-29 LAB — COVID-19 CORONAVIRUS QUALITATIVE PCR: COVID-19 Coronavirus Qual PCR Result: NOT DETECTED

## 2020-04-29 NOTE — Patient Instructions (Addendum)
Preop visit main points    Surgery Type: Revision left total knee replacement on 05/01/20    - COVID appointment: today in clinic     - Medicines to stop taking before surgery: 7 days: See handout 3 days: NSAIDs such as your meloxicam and any other OTC NSAIDs such as Ibuprofen or Aleve    - Pre Anesthesia Phone call: They will notify you of your surgery start time, when to check in, and medications to hold the night before and morning of surgery.     - Take a daily shower using a small amount of antiseptic soap for 6 days (last shower will be the morning of surgery). Start date: 04/26/20    - Physical Therapy referral given today: First appointment should be 4-7 days after surgery.    - Pain Management: 2 business day refill policy. Please call clinic to request refills. If approved, prescriptions can be electronically sent to your preferred pharmacy.   - Elevate and ice surgical leg frequently to help with swelling post-operatively. Toes need to be above the level of your heart. Ice pack to incision site as frequently as 20 minutes on/20 minutes off.    - Blood Clot Prevention: You will be prescribed a blood thinner at discharge, take as instructed to prevent risk of blood clot after surgery.   - Wear compression socks at least until your first post op appointment, or longer as directed by your surgeon, to help with swelling post-operatively.     - Infection Prevention: Keep incision clean to prevent infection. You will receive information at the hospital about how to care for your incision and when you can shower.    - Constipation prevention!    - Call or send a message via MyChart with any questions or concerns. If you have any urgent needs and need a call back the same day, please do not leave a voicemail message on my direct line. Call the clinic to speak to someone directly. Please reference your medical record number when leaving me a voicemail message: [id number].

## 2020-04-29 NOTE — Progress Notes (Signed)
Patient scheduled for a Rev LTKA on 05/01/20 with Dr. Catalina Gravel . Preoperative instruction given per [facility] protocol. [facility] 'Your Surgical Experience' booklet given. Stressed NPO status ten hours prior to surgery Explained what medications to avoid prior to surgery. 4 oz bottle of chlorhexidine soap given as well as instruction for use. Discussed signs and symptoms of possible post-operative complications including infection, DVT, and constipation. The importance of pain control, refill policy on narcotics, and prophylactic antibiotic protocol also explained. No further questions or concerns at this time, patient advised to call if needed.    R/o MRSA swabs obtained in clinic. Provided instruction for prophylactic use of CHG soap for 5 days prior to surgery. Patient stated understanding of these instructions.     COVID test done in clinic. Preop labs completed prior to today's appointment. PT appointments have been made at [facility].     Patient lives at home with daughter and partner who will be able to assist post-operatively.    Insurance  [employer] MEDICARE [employer]     Roberts Gaudy BSN, RN   Clinic RN   [facility] [employer]   [phone]

## 2020-04-30 LAB — R/O MRSA

## 2020-05-01 ENCOUNTER — Other Ambulatory Visit: Payer: Self-pay

## 2020-05-01 ENCOUNTER — Encounter (HOSPITAL_COMMUNITY): Payer: Self-pay | Admitting: Orthopaedic Surgery

## 2020-05-01 ENCOUNTER — Inpatient Hospital Stay (HOSPITAL_COMMUNITY): Admit: 2020-05-01 | Payer: Self-pay | Admitting: Orthopaedic Surgery

## 2020-05-01 ENCOUNTER — Inpatient Hospital Stay (HOSPITAL_BASED_OUTPATIENT_CLINIC_OR_DEPARTMENT_OTHER): Payer: No Typology Code available for payment source

## 2020-05-01 ENCOUNTER — Encounter (HOSPITAL_BASED_OUTPATIENT_CLINIC_OR_DEPARTMENT_OTHER): Payer: No Typology Code available for payment source | Admitting: Certified Registered"

## 2020-05-01 ENCOUNTER — Ambulatory Visit
Admission: RE | Admit: 2020-05-01 | Discharge: 2020-05-02 | Disposition: A | Discharge status: Home / Self Care | Payer: No Typology Code available for payment source | Attending: Orthopaedic Surgery | Admitting: Orthopaedic Surgery

## 2020-05-01 ENCOUNTER — Ambulatory Visit (HOSPITAL_COMMUNITY): Payer: Medicare Other | Admitting: Orthopaedic Surgery

## 2020-05-01 ENCOUNTER — Encounter (HOSPITAL_COMMUNITY): Payer: Self-pay

## 2020-05-01 ENCOUNTER — Encounter (HOSPITAL_COMMUNITY)
Admission: RE | Disposition: A | Discharge status: Home / Self Care | Payer: Self-pay | Source: Home / Self Care | Attending: Orthopaedic Surgery

## 2020-05-01 DIAGNOSIS — T84093A Other mechanical complication of internal left knee prosthesis, initial encounter: Secondary | ICD-10-CM | POA: Insufficient documentation

## 2020-05-01 DIAGNOSIS — Z96659 Presence of unspecified artificial knee joint: Secondary | ICD-10-CM | POA: Diagnosis present

## 2020-05-01 DIAGNOSIS — T84023A Instability of internal left knee prosthesis, initial encounter: Secondary | ICD-10-CM

## 2020-05-01 DIAGNOSIS — Z79899 Other long term (current) drug therapy: Secondary | ICD-10-CM | POA: Insufficient documentation

## 2020-05-01 DIAGNOSIS — M0689 Other specified rheumatoid arthritis, multiple sites: Secondary | ICD-10-CM | POA: Insufficient documentation

## 2020-05-01 DIAGNOSIS — Z96652 Presence of left artificial knee joint: Secondary | ICD-10-CM

## 2020-05-01 DIAGNOSIS — Z791 Long term (current) use of non-steroidal anti-inflammatories (NSAID): Secondary | ICD-10-CM | POA: Insufficient documentation

## 2020-05-01 DIAGNOSIS — T84018D Broken internal joint prosthesis, other site, subsequent encounter: Secondary | ICD-10-CM | POA: Diagnosis present

## 2020-05-01 DIAGNOSIS — R69 Illness, unspecified: Secondary | ICD-10-CM

## 2020-05-01 DIAGNOSIS — S82002A Unspecified fracture of left patella, initial encounter for closed fracture: Secondary | ICD-10-CM

## 2020-05-01 DIAGNOSIS — Z973 Presence of spectacles and contact lenses: Secondary | ICD-10-CM | POA: Insufficient documentation

## 2020-05-01 DIAGNOSIS — Y792 Prosthetic and other implants, materials and accessory orthopedic devices associated with adverse incidents: Secondary | ICD-10-CM | POA: Insufficient documentation

## 2020-05-01 HISTORY — PX: KNEE ARTHROPLASTY: SHX992

## 2020-05-01 HISTORY — PX: KNEE TOTAL ARTHROPLASTY REVISION: SHX11112

## 2020-05-01 LAB — PREGNANCY TEST, URINE
Pregnancy Test, URN: NEGATIVE
Specific Gravity, URN: 1.01 g/mL (ref 1.006–1.027)

## 2020-05-01 SURGERY — REVISION, TOTAL ARTHROPLASTY, KNEE, ALL COMPONENTS
Anesthesia: Regional | Site: Knee | Laterality: Left | Wound class: Class I/ Clean

## 2020-05-01 SURGERY — REVISION, TOTAL ARTHROPLASTY, KNEE, ALL COMPONENTS
Anesthesia: Spinal | Site: Knee | Laterality: Left

## 2020-05-01 MED ORDER — EPHEDRINE SULFATE (PRESSORS) 50 MG/ML IV SOLN
INTRAVENOUS | Status: DC | PRN
Start: 2020-05-01 — End: 2020-05-01
  Administered 2020-05-01: 10 mg via INTRAVENOUS
  Administered 2020-05-01 (×3): 5 mg via INTRAVENOUS

## 2020-05-01 MED ORDER — PREGABALIN 50 MG OR CAPS
50.0000 mg | ORAL_CAPSULE | ORAL | Status: AC
Start: 2020-05-01 — End: 2020-05-01
  Administered 2020-05-01: 50 mg via ORAL
  Filled 2020-05-01: qty 1

## 2020-05-01 MED ORDER — ACETAMINOPHEN 500 MG OR TABS
1000.0000 mg | ORAL_TABLET | Freq: Three times a day (TID) | ORAL | Status: DC
Start: 2020-05-01 — End: 2020-05-02
  Administered 2020-05-01 – 2020-05-02 (×3): 1000 mg via ORAL
  Filled 2020-05-01 (×3): qty 2

## 2020-05-01 MED ORDER — OXYCODONE HCL 5 MG OR TABS
5.0000 mg | ORAL_TABLET | ORAL | Status: DC | PRN
Start: 2020-05-01 — End: 2020-05-02
  Administered 2020-05-01 – 2020-05-02 (×4): 10 mg via ORAL
  Filled 2020-05-01 (×4): qty 2

## 2020-05-01 MED ORDER — ATROPINE SULFATE 1 MG/10ML IJ SOSY
0.5000 mg | PREFILLED_SYRINGE | INTRAMUSCULAR | Status: DC | PRN
Start: 2020-05-01 — End: 2020-05-01

## 2020-05-01 MED ORDER — PROPOFOL 1000 MG/100ML IV EMUL
INTRAVENOUS | Status: AC
Start: 2020-05-01 — End: ?
  Filled 2020-05-01: qty 100

## 2020-05-01 MED ORDER — PHENYLEPHRINE HCL-NACL 25-0.9 MG/250ML-% IV SOLN
INTRAVENOUS | Status: DC | PRN
Start: 2020-05-01 — End: 2020-05-01
  Administered 2020-05-01: 0.2 ug/kg/min via INTRAVENOUS

## 2020-05-01 MED ORDER — BUPIVACAINE-EPINEPHRINE (PF) 0.25% -1:200000 IJ SOLN
INTRAMUSCULAR | Status: DC | PRN
Start: 2020-05-01 — End: 2020-05-01
  Administered 2020-05-01: 30 mL via INTRAMUSCULAR

## 2020-05-01 MED ORDER — FENTANYL CITRATE (PF) 100 MCG/2ML IJ SOLN
INTRAMUSCULAR | Status: AC
Start: 2020-05-01 — End: ?
  Filled 2020-05-01: qty 2

## 2020-05-01 MED ORDER — CELECOXIB 200 MG OR CAPS
400.0000 mg | ORAL_CAPSULE | ORAL | Status: AC
Start: 2020-05-01 — End: 2020-05-01
  Administered 2020-05-01: 400 mg via ORAL
  Filled 2020-05-01: qty 2

## 2020-05-01 MED ORDER — CEFAZOLIN SODIUM-DEXTROSE 2-4 GM/100ML-% IV SOLN
2.0000 g | Freq: Three times a day (TID) | INTRAVENOUS | Status: AC
Start: 2020-05-01 — End: 2020-05-02
  Administered 2020-05-01 – 2020-05-02 (×2): 2 g via INTRAVENOUS
  Filled 2020-05-01 (×2): qty 100

## 2020-05-01 MED ORDER — BUPIVACAINE-EPINEPHRINE (PF) 0.25% -1:200000 IJ SOLN
INTRAMUSCULAR | Status: AC
Start: 2020-05-01 — End: ?
  Filled 2020-05-01: qty 30

## 2020-05-01 MED ORDER — LIDOCAINE HCL 2 % IJ SOLN
INTRAMUSCULAR | Status: DC | PRN
Start: 2020-05-01 — End: 2020-05-01
  Administered 2020-05-01: 40 mg via INTRAVENOUS

## 2020-05-01 MED ORDER — PHENYLEPHRINE HCL-NACL 1-0.9 MG/10ML-% IV SOSY
PREFILLED_SYRINGE | INTRAVENOUS | Status: DC | PRN
Start: 2020-05-01 — End: 2020-05-01
  Administered 2020-05-01: 50 ug via INTRAVENOUS

## 2020-05-01 MED ORDER — ACETAMINOPHEN 325 MG OR TABS
650.0000 mg | ORAL_TABLET | ORAL | Status: DC | PRN
Start: 2020-05-01 — End: 2020-05-01

## 2020-05-01 MED ORDER — POTASSIUM CHLORIDE IN NACL 20-0.9 MEQ/L-% IV SOLN
100.0000 mL/h | INTRAVENOUS | Status: DC
Start: 2020-05-01 — End: 2020-05-02
  Administered 2020-05-01 (×2): 100 mL/h via INTRAVENOUS

## 2020-05-01 MED ORDER — LACTATED RINGERS BOLUS
500.0000 mL | Freq: Once | INTRAVENOUS | Status: DC | PRN
Start: 2020-05-01 — End: 2020-05-01

## 2020-05-01 MED ORDER — ONDANSETRON HCL 4 MG OR TABS
4.0000 mg | ORAL_TABLET | Freq: Three times a day (TID) | ORAL | Status: DC | PRN
Start: 2020-05-01 — End: 2020-05-02
  Administered 2020-05-01 – 2020-05-02 (×2): 4 mg via ORAL
  Filled 2020-05-01 (×2): qty 1

## 2020-05-01 MED ORDER — SYRINGE (ANESTHESIA OSM ONLY)
INTRAVENOUS | Status: DC | PRN
Start: 2020-05-01 — End: 2020-05-01
  Administered 2020-05-01: 50 ug/kg/min via INTRAVENOUS

## 2020-05-01 MED ORDER — KETOROLAC TROMETHAMINE 30 MG/ML IJ SOLN
15.0000 mg | Freq: Once | INTRAMUSCULAR | Status: DC
Start: 2020-05-01 — End: 2020-05-01

## 2020-05-01 MED ORDER — MORPHINE SULFATE (PF) 10 MG/ML IV/IJ SOLN WRAPPER
1.0000 mg | Status: DC | PRN
Start: 2020-05-01 — End: 2020-05-01

## 2020-05-01 MED ORDER — LACTATED RINGERS IV SOLN
100.0000 mL/h | INTRAVENOUS | Status: DC
Start: 2020-05-01 — End: 2020-05-02

## 2020-05-01 MED ORDER — LIDOCAINE HCL PF 2% IV/IJ SOSY/SOLN WRAPPER (ANESTHESIA OSM ONLY)
INTRAVENOUS | Status: DC | PRN
Start: 2020-05-01 — End: 2020-05-01
  Administered 2020-05-01: 40 mg via INTRAVENOUS

## 2020-05-01 MED ORDER — ALBUTEROL SULFATE (2.5 MG/3ML) 0.083% IN NEBU
2.5000 mg | INHALATION_SOLUTION | Freq: Once | RESPIRATORY_TRACT | Status: DC
Start: 2020-05-01 — End: 2020-05-01

## 2020-05-01 MED ORDER — ROPIVACAINE HCL 5 MG/ML IJ SOLN
INTRAMUSCULAR | Status: DC | PRN
Start: 2020-05-01 — End: 2020-05-01
  Administered 2020-05-01: 3 mL via INTRATHECAL

## 2020-05-01 MED ORDER — GABAPENTIN 300 MG OR CAPS
300.0000 mg | ORAL_CAPSULE | ORAL | Status: DC
Start: 2020-05-01 — End: 2020-05-01
  Filled 2020-05-01: qty 1

## 2020-05-01 MED ORDER — TRAMADOL HCL 50 MG OR TABS
50.0000 mg | ORAL_TABLET | Freq: Four times a day (QID) | ORAL | Status: DC | PRN
Start: 2020-05-01 — End: 2020-05-02

## 2020-05-01 MED ORDER — KETOROLAC TROMETHAMINE 15 MG/ML IJ SOLN
15.0000 mg | Freq: Four times a day (QID) | INTRAMUSCULAR | Status: DC
Start: 2020-05-01 — End: 2020-05-02
  Administered 2020-05-01 – 2020-05-02 (×3): 15 mg via INTRAVENOUS
  Filled 2020-05-01 (×3): qty 1

## 2020-05-01 MED ORDER — LACTATED RINGERS IV SOLN
50.0000 mL/h | INTRAVENOUS | Status: DC
Start: 2020-05-01 — End: 2020-05-02
  Administered 2020-05-01: 50 mL/h via INTRAVENOUS

## 2020-05-01 MED ORDER — PROPOFOL 10 MG/ML IV EMUL WRAPPER (OSM ONLY)
INTRAVENOUS | Status: DC | PRN
Start: 2020-05-01 — End: 2020-05-01
  Administered 2020-05-01: 30 mg via INTRAVENOUS

## 2020-05-01 MED ORDER — ROPIVACAINE HCL 5 MG/ML IJ SOLN
INTRAMUSCULAR | Status: AC
Start: 2020-05-01 — End: ?
  Filled 2020-05-01: qty 30

## 2020-05-01 MED ORDER — SODIUM CHLORIDE 0.9 % IR SOLN
Status: DC | PRN
Start: 2020-05-01 — End: 2020-05-01
  Administered 2020-05-01: 3000 mL
  Administered 2020-05-01: 1000 mL

## 2020-05-01 MED ORDER — APREPITANT 40 MG OR CAPS
40.0000 mg | ORAL_CAPSULE | ORAL | Status: AC
Start: 2020-05-01 — End: 2020-05-01
  Administered 2020-05-01: 40 mg via ORAL
  Filled 2020-05-01: qty 1

## 2020-05-01 MED ORDER — TRANEXAMIC ACID-NACL 1000-0.7 MG/100ML-% IV SOLN
1000.0000 mg | Freq: Once | INTRAVENOUS | Status: DC
Start: 2020-05-01 — End: 2020-05-01
  Filled 2020-05-01: qty 100

## 2020-05-01 MED ORDER — NALOXONE HCL 0.4 MG/ML IJ SOLN
0.0400 mg | INTRAMUSCULAR | Status: DC | PRN
Start: 2020-05-01 — End: 2020-05-01

## 2020-05-01 MED ORDER — OXYCODONE HCL 5 MG OR TABS
5.0000 mg | ORAL_TABLET | ORAL | Status: DC | PRN
Start: 2020-05-01 — End: 2020-05-01
  Administered 2020-05-01: 10 mg via ORAL
  Filled 2020-05-01: qty 2

## 2020-05-01 MED ORDER — LIDOCAINE HCL (PF) 2 % IJ SOLN
INTRAMUSCULAR | Status: AC
Start: 2020-05-01 — End: ?
  Filled 2020-05-01: qty 5

## 2020-05-01 MED ORDER — POLYETHYLENE GLYCOL 3350 17 G OR PACK
17.0000 g | PACK | Freq: Every day | ORAL | Status: DC
Start: 2020-05-02 — End: 2020-05-02
  Administered 2020-05-02: 17 g via ORAL
  Filled 2020-05-01: qty 1

## 2020-05-01 MED ORDER — ASPIRIN 81 MG OR TBEC
81.0000 mg | DELAYED_RELEASE_TABLET | Freq: Two times a day (BID) | ORAL | Status: DC
Start: 2020-05-02 — End: 2020-05-02
  Administered 2020-05-02: 81 mg via ORAL
  Filled 2020-05-01: qty 1

## 2020-05-01 MED ORDER — ONDANSETRON HCL 4 MG/2ML IJ SOLN
4.0000 mg | Freq: Three times a day (TID) | INTRAMUSCULAR | Status: DC | PRN
Start: 2020-05-01 — End: 2020-05-02

## 2020-05-01 MED ORDER — MIDAZOLAM HCL (PF) 1 MG/ML IJ SOLN WRAPPER (ANESTHESIA OSM ONLY)
INTRAMUSCULAR | Status: DC | PRN
Start: 2020-05-01 — End: 2020-05-01
  Administered 2020-05-01: 2 mg via INTRAVENOUS

## 2020-05-01 MED ORDER — MIDAZOLAM HCL (PF) 2 MG/2ML IJ SOLN
INTRAMUSCULAR | Status: AC
Start: 2020-05-01 — End: ?
  Filled 2020-05-01: qty 2

## 2020-05-01 MED ORDER — ONDANSETRON HCL 4 MG/2ML IJ SOLN
4.0000 mg | INTRAMUSCULAR | Status: DC | PRN
Start: 2020-05-01 — End: 2020-05-01

## 2020-05-01 MED ORDER — CALCIUM CARBONATE ANTACID 500 MG OR CHEW
1000.0000 mg | CHEWABLE_TABLET | Freq: Three times a day (TID) | ORAL | Status: DC | PRN
Start: 2020-05-01 — End: 2020-05-02

## 2020-05-01 MED ORDER — FENTANYL CITRATE (PF) 50 MCG/ML IJ SOLN WRAPPER (ANESTHESIA OSM ONLY)
INTRAMUSCULAR | Status: DC | PRN
Start: 2020-05-01 — End: 2020-05-01
  Administered 2020-05-01 (×2): 12.5 ug via INTRAVENOUS

## 2020-05-01 MED ORDER — CEFAZOLIN SODIUM-DEXTROSE 2-4 GM/100ML-% IV SOLN
2.0000 g | INTRAVENOUS | Status: AC
Start: 2020-05-01 — End: 2020-05-01
  Administered 2020-05-01: 2 g via INTRAVENOUS
  Filled 2020-05-01: qty 100

## 2020-05-01 MED ORDER — HYDROMORPHONE HCL 1 MG/ML IJ SOLN
0.5000 mg | INTRAMUSCULAR | Status: DC | PRN
Start: 2020-05-01 — End: 2020-05-02

## 2020-05-01 MED ORDER — TRANEXAMIC ACID-NACL 1000-0.7 MG/100ML-% IV SOLN
1000.0000 mg | Freq: Once | INTRAVENOUS | Status: AC
Start: 2020-05-01 — End: 2020-05-01
  Administered 2020-05-01 (×2): 1000 mg via INTRAVENOUS
  Filled 2020-05-01: qty 100

## 2020-05-01 MED ORDER — MELATONIN 3 MG OR TABS
3.0000 mg | ORAL_TABLET | Freq: Every evening | ORAL | Status: DC | PRN
Start: 2020-05-01 — End: 2020-05-02

## 2020-05-01 MED ORDER — SENNOSIDES 8.6 MG OR TABS
17.2000 mg | ORAL_TABLET | Freq: Two times a day (BID) | ORAL | Status: DC
Start: 2020-05-01 — End: 2020-05-02
  Administered 2020-05-01 – 2020-05-02 (×2): 17.2 mg via ORAL
  Filled 2020-05-01 (×2): qty 2

## 2020-05-01 SURGICAL SUPPLY — 39 items
ADHESIVE SKIN DERMABOND PRINEO 60CM 3.8 ML (Closure Device) ×2 IMPLANT
APPLICATOR PREP CHLORAPREP 26ML HI-LITE ORANGE 2% CHG (Prep) ×4 IMPLANT
AUGMENT FEMORAL PERSONA 7 7+ H10MM KNEE DISTAL STERILE LF (Arthroplasty) ×2 IMPLANT
AUGMENT TIBIAL 5MM PERSONA CD KNEE LEFT LATERAL (Arthroplasty) ×1 IMPLANT
AUGMENT TIBIAL 5MM PERSONA CD KNEE LEFT MEDIAL (Arthroplasty) ×1 IMPLANT
BANDAGE ADHESIVE PRIMAPORE 13 3/4INX4IN ISLAND (Dressing) ×1 IMPLANT
BLADE CHISEL 2.5INX12MM FLEX LF (Blade) ×1 IMPLANT
BLADE CHISEL 2.5INX8MM FLEX LF (Blade) ×1 IMPLANT
BLADE SAGITTAL DE SOUTTER RECIPROCATING DRIVE 12 X 80 X 1.27MM (Blade) ×2 IMPLANT
BLADE SAGITTAL DE SOUTTER WEBLITE 19 X 90 X 1.27MM (Blade) ×2 IMPLANT
BLADE SAW 90MMX13MM THK1MM (Blade) ×2 IMPLANT
BONE CEMENT REVOLUTION MIX (Delivery System) ×2 IMPLANT
CATHETER TRAY SURESTEP 16FR URINE METER (Catheter) ×2 IMPLANT
CEMENT BONE PALACOS R 40 GM HIGH VISCOSITY ×4 IMPLANT
COMPONENT FEMORAL PERSONA 7 STD LEFT KNEE CEMENT CR CONSTR (Arthroplasty) ×2 IMPLANT
COMPONENT FEMORAL PERSONA CONDYLAR COCR STRL SZ 7 STD LEFT (Arthroplasty) IMPLANT
COVER FLEX SNAP ON STERILE LF SURGICLICK LIGHT (Drape) ×2 IMPLANT
DEVICE SCREW PERSONA 48MM HEX (Other) IMPLANT
DEVICE SCREW PERSONA 48MM HEX 00590103548 (Other) ×4
DEVICE SCREW PERSONA REVISION HEX 3.5MM X 27MM (Other) ×1 IMPLANT
DRESSING PRIMAPORE 8INX4IN HIGH ABSORB PAD ACRYL (Dressing) ×4 IMPLANT
HOOD SURGEON FLYTE STERI-SHIELD PEELAWAY FACE SHIELD (Gown) ×8 IMPLANT
LINEN GOWN XL (Gown) ×4 IMPLANT
LINEN PACK SURGICAL (Other) ×2 IMPLANT
LINER POSITIONER ALVARADO STERILE DISP (Other) ×2 IMPLANT
NEEDLE HYPODERMIC SAFETY 21GA 1-1/2IN MAGELLAN (Needle) ×2 IMPLANT
PACK CUST TOTAL KNEE (Pack) ×2 IMPLANT
PAD ESURG GRNDING UNIV PREATTACH CORD SPLIT (Other) ×2 IMPLANT
PERSONA ARTICULAR 7-9+ CD 16MM (Arthroplasty) ×1 IMPLANT
PREP SOLUTION PVP IODINE 3/4OZ POUCH (Prep) ×1 IMPLANT
SET INTERPULSE SUCTION TUBE HIGH FLOW TIP (Other) ×2 IMPLANT
STEM TIBIAL PERSONA STRAIGHT L+75 MM OD14 MM KNEE REVISION SMOOTH STERILE LF (Arthroplasty) ×1 IMPLANT
STEM TIBIAL PERSONA STRAIGHT L135+ MM OD12 MM KNEE REVISION SPLINE STERILE LF (Arthroplasty) ×2 IMPLANT
STEM TIBIAL STRAIGHT PERSONA SPLINE REVISION 135MM 12MM STERILE (Arthroplasty) IMPLANT
SUTURE STRATAFIX 36CM 1 CTX VIOLET (Suture) ×1 IMPLANT
SUTURE STRATAFIX 70CM 2-0 CP-1 SPIRAL (Suture) ×1 IMPLANT
SYRINGE BD LUER 20ML CONCENTRIC TIP (Syringe) ×2 IMPLANT
TIBIAL PERSONA KNEE LEFT D REV (Arthroplasty) ×1 IMPLANT
TUBE SUCTION KAMVAC MINI (Tubing) ×2 IMPLANT

## 2020-05-01 NOTE — H&P (Signed)
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 She was seen recently in clinic, where a detailed review of HPI can be found; 03/20/20 with Autumn Camacho.  She was noted to benefit from [place] - Left.       OBJECTIVE     Vitals (Arrival)      T: 36.2 C (05/01/20 0804)  BP: 104/71 (05/01/20 0804)  HR: 73 (05/01/20 0804)  RR: 16 (05/01/20 0804)  SpO2: 97 % (05/01/20 0804)   Vitals (Most recent in last 24 hrs)   T: 36.2 C (05/01/20 0804)  BP: 104/71 (05/01/20 0804)  HR: 73 (05/01/20 0804)  RR: 16 (05/01/20 0804)  SpO2: 97 % (05/01/20 0804)  T range: Temp  Min: 36.2 C  Max: 36.2 C  Wt 170 lb 6.7 oz (77.3 kg)     (no height taken for this visit)     Body mass index is 32.2 kg/m.       EXAM:  Ht '5\' 1"'$  (1.549 m)  Wt 167 lb (75.751 kg  HEENT Negative  Chest clear  Coronary RRR  Abdomen soft     Exam of the left knee:On inspection, alignment isnormal. Skin exam shows awell-healed incision. On palpation, the knee is tender around the patella. Range of motion is 20 hyper to 125 of flexion. Stability exam shows gross multi-directional instability. Muscle bulk, power, and tone are markedly atrophic.Patellar grind and compression isnegative.  From the 02/02/20.    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 Office-obtained consent is accurate; further questions about the procedure(s) were answered..  Peri-operative prophylactic antibiotics ordered. Antibiotics ordered include:ceFAZolin in dextrose (iso-osmotic) - 2-4 GM/100ML-%.     Autumn Dales, MD

## 2020-05-01 NOTE — Op Note (Signed)
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  ASA: ASA status not filed in the log.  Estimated Blood Loss: 25 mL  Total IV Fluids: 1000 mL  Wound Class: Class I/ Clean  Drains:   Urethral Catheter 05/01/20 (Active)       Implants     Type Name Action Serial No.       CEMENT BONE PALACOS R 40 GM HIGH VISCOSITY - [id number] Implanted      Arthroplasty STEM TIBIAL PERSONA STRAIGHT L+75 MM OD14 MM KNEE REVISION SMOOTH STERILE LF - [id number] Implanted      Arthroplasty PERSONA REVISION TIBIAL AUGMENT HALF BLOCK LEFT LATERAL SIZE CD 5MM THICKNESS Implanted      Arthroplasty AUGMENT TIBIAL 5MM PERSONA CD KNEE LEFT MEDIAL - [id number] Implanted      Arthroplasty TIBIAL PERSONA KNEE LEFT D REV - [id number] Implanted      Arthroplasty COMPONENT FEMORAL PERSONA 7 STD LEFT KNEE CEMENT CR CONSTR - [id number] Implanted      Arthroplasty AUGMENT FEMORAL PERSONA 7 7+ H10MM KNEE DISTAL STERILE LF - [id number] Implanted      Arthroplasty STEM TIBIAL PERSONA STRAIGHT L135+ MM OD12 MM KNEE REVISION SPLINE STERILE LF - [id number] Implanted      Arthroplasty PERSONA REVISION VIVACIT-E HIGHLY CROSSLINKED POLYETHYLENE ARTICULAR SURFACE FIXED BEARING CONSTRAINED CONDYLAR KNEE LEFT 16MM HEIGHT WITH LOCKING SCREW AND INSERT Implanted              Preoperative diagnosis:  Failed left total knee arthroplasty, with gross instability    Postoperative diagnosis:  Failed left total knee arthroplasty, with gross instability    Procedure  performed:  Revision left Total knee arthroplasty, cemented, CCK(COMPLEX due to significant multidirectional instability , which caused decreased exposure and increased difficulty, adding to operative time).    Active Comorbid Conditions:  RA    Operative Indications:  The patient is a 54 year old female who underwent a left total knee arthroplasty in 2018.  Because of instability, the left was revised the next year; the surgical notes discuss the possibility of poly exchange (which was done) versus revision of all components.  She reports that both knees have always felt somewhat unstable, but was able to manage.      She suffered a dislocation of the left knee four months ago; this was reduced by traction at [facility].  She presented here for revision total knee replacement.  I reviewed the risks of this and the alternatives to this intervention.  We emphasized those areas in which the risk was perceived to be more severe in this case than in the typical patient undergoing the procedure.  The patient desired to proceed.    Intraoperative Findings:  A well fixed left total knee arthroplasty, with gross AP instability and varus-valgus instability as a consequence of soft tissue laxity.  A CR polyethylene bearing  measured 35mm in thickness/  Preoperative range of motion 30 degrees hyperextension to 125 degrees.  Postoperative range of motion 0 to 125 degrees and beyond with symmetric collateral ligament balance in flexion and extension and central patellar tracking without lateral release using a no-thumbs technique to assess patellar tracking.  There was easy full extension without hyperextension and deep flexion to the limits of the soft tissues with no tendency of the knee to subluxate or dislocate even in deep flexion with rotation.    Attending physician:  Rondall Allegra, MD  [facility].  I certify that I am the attending on this case.  I certify that I was present for the entire case, and performed all key elements of the  procedure.    Assistants:  Chauncy Passy, MD    Estimated blood loss: 25 cc    Fluids given: 1000 cc    Anesthesia:  Spinal    Tourniquet time:  61 minutes at 250 mmHg.  This was the operative time from skin incision until the point where cementing was completed.  We released the tourniquet prior to closure to obtain final hemostasis    Components used:  We used a left sided Zimmer Persona CCK femoral component size 7 with a 12 x 135 straight stem and 62mm distal augments, a size D tibial component with a 14 x 43mm straight stem and a 41mm augment, a size 16 mm polyethylene insert; the existing patella was retained.  All components were cemented, using 2 packs of Palacos.    Procedure note:  Ms. Pankonin was brought to the operating room, and placed on the table in a supine position.  Following the induction of anesthesia and the administration of Cefazolin 2gm iv 20-30 minutes prior to incision, the patient's left leg was prepped and draped in the usual sterile fashion.  A formal timeout was performed prior to incision.  I performed a standard anterior incision, extending from the medial aspect of the tubercle up along the medial aspect of the patella, to the superior pole of the patella.  I performed a medial parapatellar arthrotomy in similar fashion, with a midtendon extension of 4 cm as needed for exposure.  I resected synovium, fat pad, and meniscus as needed for exposure, then carried out a subperiosteal dissection  on the anteromedial and medial aspect of the tibia, with careful attention to the MCL.  I began by removing the polyethylene bearing, and then removed the femoral and tibial components.  The tibial resection appeared appropriate; the distal femoral resection was greater than desired and was quite proximal.    I reamed the tibia to a scratch fit.  The tibial baseplate template size D was selected and placed in appropriate external rotation, using the tibial tubercle and spine for landmarks.  I used  the drill and keel punch to prepare the metaphysis.   I then turned my attention to the femur.   I reamed the canal to a scratch fit, and assembled a sizer.  I flexed the knee and marked the epicondylar axis as well as Whiteside's line.  I placed the femoral sizer in appropriate rotation using these axes as my landmarks.  I selected a size 7, and performed anterior, posterior, and chamfer cuts.   I then assembled trial components and put the knee through range of motion and stability checks.   I then finalized femoral preparation with the notch cutter.  We achieved full extension, at least 125 degrees of flexion, with no evidence of  instability or patellar maltracking.  I removed the trial components, prepared the bone for cement with pulsatile lavage and then dried the bone.  I cemented the components in place.  I removed cement from throughout the knee as it hardened.  I released the tourniquet, obtained final hemostasis, and copiously irrigated the surgical field.  Wound was closed in layers in routine fashion irrigating with pulsatile lavage between each layer.  Skin edges were reapproximated with Dermabond.  A dressing, lightly compressive wrap, and TED hose were applied to the limb.  Patient was taken to recovery in good condition.  There were no intraoperative complications.    Postoperative Plan:  Weightbearing as tolerated and early aggressive range of motion with physical therapy.  Thromboprophylaxis will be with ASA 81 mg po bid x 6 weeks, sequential compression devices, and TED hose in accordance the AAOS practice guideline on prevention of pulmonary embolism.  Analgesia will be with oral and regional agents.    Cefazolin 2gm iv was given prior to incision as noted above; we'll continue this post-operatively for 24 hours, at which point it will be discontinued.

## 2020-05-01 NOTE — Anesthesia Postprocedure Evaluation (Signed)
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    Place of evaluation: PACU    Patient participation: patient participated    Level of consciousness: sedated and responsive to voice    Patient pain control satisfaction: patient is satisfied with level of pain control    Airway patency: patent    Cardiovascular status during assessment: stable    Respiratory status during assessment: breathing comfortably    Anesthetic complications: no    Intravascular volume status assessment: euvolemic    Nausea / vomiting: patient is not experiencing nausea      Planned post-operative disposition at time of assessment: ward care

## 2020-05-01 NOTE — Nursing Note (Signed)
L TKA revision by Dr. Catalina Gravel. Pain controlled with prn 10mg  oxy q4. Mild nausea, zofran PO effective. Foley catheter patent. Worked with PT. Good appetite.

## 2020-05-01 NOTE — Progress Notes (Addendum)
Medicare Certification  Plan of care start date: 05/01/20, end date: 07/30/20  Date of onset: 05/01/20    Medicare and other payers require an attending physician to certify the need for Rehab Therapy Services.  Your signature indicates you have reviewed and approved the Plan of Care (POC). To communicate an update or change in this plan, please add your comments below AND contact the therapist.    Patient name: MRN: DOB:   Autumn Camacho [id number] 1966/02/12       Physical Therapy  Physical Therapy Evaluation/Treatment    Patient Name: Autumn Camacho  MRN: [id number]  Today's Date: 05/01/2020    General Visit Information  General  Documentation Type: Initial Eval  Treatment Start Time: 4193  Treatment End Time: 1614  Treatment Duration (min): 39 Mins  Family/Caregiver Present: Yes    Subjective  Subjective  Patient Report/Self-Assessment: she reports no posturals sx  Patient Stated Goal: to amb with appropriate stability    Physical Therapy Diagnosis  PT Diagnosis: stable amb and transfers day 1    Patient Active Problem List   Diagnosis   . Rheumatoid arthritis involving multiple sites ([place])   . Chronic right hip pain   . Rheumatoid arthritis ([place])   . Rheumatoid arthritis involving right hand ([place])   . 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 Osteoarthritis of right hip   . Osteoarthritis of right knee   . Knee dislocation, left, initial encounter   . Failed total left knee replacement ([place])   . Chronic instability of left knee   . Failed total knee replacement, subsequent encounter       Past Medical History:   Diagnosis Date   . Chronic right hip pain 02/26/2019   . Fracture     Bilateral elbow fx   . Rheumatoid arthritis ([place])    . Rheumatoid arthritis ([place])    . Spinal stenosis    . 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 foot Bilateral      Arch surgery   . KNEE ARTHROPLASTY      LEFT 2018 REPAIR 2019   . PR ADENOIDECTOMY PRIMARY <AGE 20      AGE 49  T/A    . PR TONSILLECTOMY ONE-HALF <AGE 20     . PR UNLISTED PROCEDURE FEMUR/KNEE Bilateral     TKA   . PR UNLISTED PROCEDURE FOOT/TOES Bilateral 2009 R,  2010 L    Reconstructive surgery   . TKA Left 2019    revision from prior TKA       Home Living  Home Living  Type of Home: House  Entry Stairs: 9  Entry Stairs Railings: Right railing  Indoor Stairs: none  Lives With: [employer] wheeled or pick-up walker    Prior Level of Function  Prior Function  Prior Level of Function: Independent with all functional mobility and ADLs  Assistance Available at Home: 24 hour assistance      Pain  Pain Assessment  Pain Assessment: Pain Scale  Pain Score:3  Pain Descriptor Scale: Moderate  Pain Intensity: Rest  Pain Location: incision    Cognition  Cognition  Overall Cognitive Status: Within Functional Limits  Arousal/Alertness: Appropriate responses to stimuli  Orientation Level: Oriented X4  Following Commands: Follows  all commands and directions without difficulty  Safety Judgment: Good awareness of safety precautions  Awareness of Errors: Good awareness of errors made  Deficits: Fully aware of deficits  Attention Span: Appears intact    Extremity Assessments  RUE Assessment  RUE Assessment: Within Functional Limits              LUE Assessment  LUE Assessment: Within Functional Limits              RLE Assessment  RLE Assessment: Within Functional Limits              LLE Assessment  LLE Assessment: Exceptions to [facility]  AROM LLE (degrees)  L Knee Extension 0-130: 0- 95             Sensation     Proprioception     Coordination     Perception     Vision/Vestibular     Anthropometric Measurements  Anthropometric Measurements  Weight: 77.3 kg (170 lb 6.7 oz)  Static Sitting Balance     Dynamic Sitting Balance     Static Standing Balance     Dynamic Standing Balance     Activity Tolerance        Bed Mobility  Bed Mobility  Supine>Sit: Independent(using the BR)  Sit>Supine: Independent  Transfers  Transfers  Sit<>Stand: Supervision / Stand by assistance  Bed<>Chair/WC: Supervision / Stand by assistance  Wheelchair Assessment     Ambulation  Ambulation  Distance: 20  Assistance Needs: Supervision / Stand by assistance  Assistive Device Used: Front wheeled walker  Gait quality/descriptors: Decreased weight shift;Decreased step length  10 Meter Walk Test     15 Foot Walk Test     2 Minute Walk Test     6 Minute Walk Test     Stairs       Outcome Measures          Treatment  Therapeutic Exercise  Therapeutic Exercise  Therapeutic Exercise Time Entry: 10  Therapeutic Exercise Activity 1: she performed AP and knee flex/ext in a seated position  Therapeutic Activity  Therapeutic Activity  Therapeutic Activity Time Entry: 15  Therapeutic Activity 1: PT addressed bed mobility, sitting balance, sit to stand transfers, pivot transfers and ambulation in the room with a personal FWW   Neuromuscular Re-Education     Gait Training     Other Activities       Assessment/Plan  PT Assessment Results: Decreased strength;Impaired balance;Pain(pain L knee)  Impact on Function and Participation: This is a 54 yo F with dx of  a failed L knee replacement and received a TKA and is WBAT. She resides in a home with 8-9 step entrance with a HR and uses a FWW at baseline. She resides with a spouse. This PT eval indicates appropriate strength in her UEs and RLE; her LLE AROM is 0- 95 degrees. Bed mobility required use of the BR and her sitting balance is good; sit to stand transfers required sba of 1 to a FWW and she safely amb 20' in the room and returned to her bed. She was not postural during this session and was pleased with her performance. PT recommends working with her on her functional mobility on level surfaces and steps. She has a personal FWW to use at  Home.   Prognosis: Excellent     Barriers to Discharge: none        Treatment/Interventions: Therapeutic exercise;Therapeutic functional activities;Neuromuscular re-education;Gait/Stairs training  PT Plan: Skilled PT  PT Dosage: 1-2x/day, 5-6 days/week  PT Discharge Recommendations: Follow up with PT at next level of care (to be determined by primary team)        Equipment Recommended: Front wheeled walker    Goals  STG  Goal: Transfer with AD  Estimated Completion Date: 05/02/20  Goal Status: Progressing  STG  Goal: Amb with AD  Estimated Completion Date: 05/02/20  Goal Status: Progressing  STG  Goal: Stairs  Estimated Completion Date: 05/02/20  Goal Status: Progressing                                                 Heide Spark, PT  05/01/2020

## 2020-05-01 NOTE — Anesthesia Procedure Notes (Signed)
PERIPHERAL IV    Staff:  Performing provider: Verdie Wilms, Johny Shock, CRNA  Authorizing provider: Felton Clinton, MD    Procedure Indication/Location:  Patient location: OR/Procedural area  Indication for IV: for care provided by anesthesia    Placement:  Laterality: left  Location: hand  Size: 20 G

## 2020-05-01 NOTE — Anesthesia Procedure Notes (Signed)
Neuraxial Procedure    Block Type:  Procedure: spinal.  Level: L2-L3    Block Indication/Location:   Patient location during procedure: OR/Procedural area  Indication: primary anesthetic    Staff:  Performing provider: Leni Pankonin, Johny Shock, CRNA  Authorizing provider: Felton Clinton, MD    Consent:  Risks discussed: block failure or incomplete block, motor weakness, nerve injury, bleeding/hematoma, infection, local anesthetic toxicity, shortness of breath, headache and intrathecal injection  Obtained by: Pricilla Loveless, CRNA  Obtained from: patient    Neuraxial Checklist:  Patient identifed with two distinct identifiers, Procedural consent reviewed, Block consent confirmed, Allergies reviewed, Anticoagulation status reviewed, Procedural consent reviewed, Maximum local anesthetic dose calculated, Emergency drugs available, Medication syringes labeled and Monitoring in place.    Preparation:  Position: sitting  Patient sedation: mild sedation  Monitors: SpO2 and NIBP  Skin Prep: chlorhexidine  Sterile Barriers: mask, drape and sterile gloves  Skin local anesthetic (LA) used: injected    Ultrasound guided: No    Spinal needle:  Needle type: [place]  Needle gauge: 25 G  Needle length: 100 mm  Introducer used: yes  Approach: midline  Guidance: landmark  Paresthesia: no  Aspiration: CSF  Heme: No    Date and time placed:   05/01/2020 10:45 AM

## 2020-05-02 LAB — BASIC METABOLIC PANEL
Anion Gap: 6 (ref 4–12)
Calcium: 8.3 mg/dL — ABNORMAL LOW (ref 8.9–10.2)
Carbon Dioxide, Total: 24 meq/L (ref 22–32)
Chloride: 109 meq/L — ABNORMAL HIGH (ref 98–108)
Creatinine: 0.51 mg/dL (ref 0.38–1.02)
Glucose: 81 mg/dL (ref 62–125)
Potassium: 4.3 meq/L (ref 3.6–5.2)
Sodium: 139 meq/L (ref 135–145)
Urea Nitrogen: 8 mg/dL (ref 8–21)
eGFR by CKD-EPI: >60 mL/min/{1.73_m2} (ref >59)

## 2020-05-02 LAB — CBC (HEMOGRAM)
Hematocrit: 34 %
Hemoglobin: 10.7 g/dL — ABNORMAL LOW (ref 11.5–15.5)
MCH: 31.2 pg (ref 27.3–33.6)
MCHC: 31.8 g/dL — ABNORMAL LOW (ref 32.2–36.5)
MCV: 98 fL (ref 81–98)
Platelet Count: 182 10*3/uL (ref 150–400)
RBC: 3.43 10*6/uL — ABNORMAL LOW (ref 3.80–5.00)
RDW-CV: 13.2 % (ref 11.6–14.4)
WBC: 4.81 10*3/uL (ref 4.3–10.0)

## 2020-05-02 MED ORDER — ONDANSETRON HCL 4 MG OR TABS
4.0000 mg | ORAL_TABLET | Freq: Three times a day (TID) | ORAL | 0 refills | Status: DC | PRN
Start: 2020-05-02 — End: 2020-06-14

## 2020-05-02 MED ORDER — ASPIRIN 81 MG OR TBEC
81.0000 mg | DELAYED_RELEASE_TABLET | Freq: Two times a day (BID) | ORAL | 0 refills | Status: DC
Start: 2020-05-02 — End: 2020-07-17

## 2020-05-02 MED ORDER — CELECOXIB 200 MG OR CAPS
200.0000 mg | ORAL_CAPSULE | Freq: Two times a day (BID) | ORAL | 0 refills | Status: DC
Start: 2020-05-02 — End: 2020-07-17

## 2020-05-02 MED ORDER — SODIUM CHLORIDE 0.9% IV BOLUS
1000.0000 mL | Freq: Once | INTRAVENOUS | Status: AC
Start: 2020-05-02 — End: 2020-05-02
  Administered 2020-05-02: 1000 mL via INTRAVENOUS

## 2020-05-02 MED ORDER — SENNOSIDES 8.6 MG OR TABS
17.2000 mg | ORAL_TABLET | Freq: Two times a day (BID) | ORAL | 0 refills | Status: DC
Start: 2020-05-02 — End: 2020-06-14

## 2020-05-02 MED ORDER — OXYCODONE HCL 5 MG OR TABS
5.0000 mg | ORAL_TABLET | ORAL | 0 refills | Status: DC | PRN
Start: 2020-05-02 — End: 2020-06-14

## 2020-05-02 MED ORDER — TRAMADOL HCL 50 MG OR TABS
50.0000 mg | ORAL_TABLET | Freq: Four times a day (QID) | ORAL | 0 refills | Status: DC | PRN
Start: 2020-05-02 — End: 2020-06-14

## 2020-05-02 MED ORDER — ACETAMINOPHEN 500 MG OR TABS
1000.0000 mg | ORAL_TABLET | Freq: Three times a day (TID) | ORAL | 0 refills | Status: DC
Start: 2020-05-02 — End: 2020-07-17

## 2020-05-02 NOTE — Discharge Instr - Activity (Signed)
OT:  Lower body dressing: with assistance, slip on shoes.   Toilet transfer: has ada toilet and bilateral hand holds.   Tub transfer: initial sponge bathing, assistance for transfer once able to weight bear 100% with left. Has seat, grabbars.   Higher level living skills: assist as needed.   Precautions: weight bearing as tolerated.

## 2020-05-02 NOTE — Progress Notes (Signed)
Illness Severity  Stable.      Patient Summary  L TKA revision by Dr. Catalina Gravel. Pain controlled with prn 10mg  oxy q4, effective.     Left knee dressing on CDI. Slept most of the night. No n/v noted this shift.      Action Items  Seen by PT  Foley      Situational Awareness  A/Ox4

## 2020-05-02 NOTE — Progress Notes (Signed)
Physical Therapy  Physical Therapy Treatment    Patient Name: Autumn Camacho  MRN: [id number]  Today's Date: 05/02/2020      General Visit Information  PT Last Visit  PT Received On: 05/02/20  Response to Previous Treatment: Patient with no complaints from previous session.    General  Documentation Type: Progress  Treatment Start Time: 1150  Treatment End Time: 1210  Treatment Duration (min): 20 Mins  Family/Caregiver Present: Yes    Subjective  Subjective  Patient Report/Self-Assessment: she reports no posturals sx  Patient Stated Goal: to return home    Patient Active Problem List   Diagnosis   . Rheumatoid arthritis involving multiple sites ([place])   . Chronic right hip pain   . Rheumatoid arthritis ([place])   . Rheumatoid arthritis involving right hand ([place])   . 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 Osteoarthritis of right hip   . Osteoarthritis of right knee   . Knee dislocation, left, initial encounter   . Failed total left knee replacement ([place])   . Chronic instability of left knee   . Failed total knee replacement, subsequent encounter         Pain  Pain Assessment  Pain Assessment: No/denies pain  Pain Score: 6  Pain Descriptor Scale: Moderate  Pain Intensity: Rest  Pain Location: Left knee    Cognition  Cognition  Overall Cognitive Status: Within Functional Limits  Arousal/Alertness: Appropriate responses to stimuli  Orientation Level: Oriented X4  Following Commands: Follows all commands and directions without difficulty  Safety Judgment: Good awareness of safety precautions  Awareness of Errors: Good awareness of errors made  Deficits: Fully aware of deficits  Attention Span: Appears intact  Bed Mobility  Bed Mobility  Supine>Sit: Independent  Sit>Supine: Independent  Transfers  Transfers  Sit<>Stand: Independent  Bed<>Chair/WC: Barrister[employer] clerk  Distance:  150  Assistance Needs: Independent  Assistive Device Used: Front wheeled walker  Gait quality/descriptors: Decreased weight shift;Decreased step length  Stairs  Stairs  Number of steps: 8  Style: Step to  Assistance Needs: Set up or clean up assistance  Assistive Device Used: 1 railing;Single point cane    Treatment  Therapeutic Exercise  Therapeutic Exercise  Therapeutic Exercise Time Entry: 10  Therapeutic Exercise Activity 1: she performed AP and knee flex/ext in a seated position  Therapeutic Activity  Therapeutic Activity  Therapeutic Activity Time Entry: 20  Therapeutic Activity 1: PT addressed sit to stand transfers and secured a standing BP which was 128/75 and she reported no postural sx. She then amb 150' with her FWW remaining stable and returned to her room. PT secured the steps, and  she ascends/descends 8 using 1 HR and SPC and returned to her room with her husband in attendance. PT has cleared her to DC and her nurse was informed.     Neuromuscular Re-Education     Hydrologist Management     Other Activities           PT Assessment Results: Decreased strength;Decreased mobility;Pain(L knee pain)  Impact on Function and Participation: This patient safely performed sit to stand transfers, was not postural, and amb 150' using a FWW and ascends/descends 8 steps using a SPC and the HR. She is cleared to return home and her nurse was informed.  Prognosis: Excellent       Treatment/Interventions: Therapeutic exercise;Therapeutic functional activities;Neuromuscular re-education;Gait/Stairs training  PT Plan: Skilled PT  PT Dosage: 1-2x/day, 5-6 days/week  PT Discharge Recommendations: Follow up with PT at next level of care (to be determined by primary team)        Equipment Recommended: Front wheeled walker      Goals  STG  Goal: Transfer with AD  Estimated Completion Date: 05/02/20  Goal Status: Progressing  STG  Goal: Amb with AD  Estimated Completion Date:  05/02/20  Goal Status: Progressing  STG  Goal: Stairs  Estimated Completion Date: 05/02/20  Goal Status: Progressing                                                 Heide Spark, PT  05/02/2020

## 2020-05-02 NOTE — Progress Notes (Signed)
Physical Therapy  Physical Therapy Treatment    Patient Name: Autumn Camacho  MRN: [id number]  Today's Date: 05/02/2020      General Visit Information  PT Last Visit  PT Received On: 05/02/20  Response to Previous Treatment: Patient with no complaints from previous session.    General  Documentation Type: Progress  Treatment Start Time: 1150  Treatment End Time: 1210  Treatment Duration (min): 20 Mins  Family/Caregiver Present: Yes    Subjective  Subjective  Patient Report/Self-Assessment: she reports no posturals sx  Patient Stated Goal: to return home    Patient Active Problem List   Diagnosis   . Rheumatoid arthritis involving multiple sites ([place])   . Chronic right hip pain   . Rheumatoid arthritis ([place])   . Rheumatoid arthritis involving right hand ([place])   . 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 Osteoarthritis of right hip   . Osteoarthritis of right knee   . Knee dislocation, left, initial encounter   . Failed total left knee replacement ([place])   . Chronic instability of left knee   . Failed total knee replacement, subsequent encounter         Pain  Pain Assessment  Pain Assessment: No/denies pain  Pain Score: 6  Pain Descriptor Scale: Moderate  Pain Intensity: Rest  Pain Location: Left knee    Cognition  Cognition  Overall Cognitive Status: Within Functional Limits  Arousal/Alertness: Appropriate responses to stimuli  Orientation Level: Oriented X4  Following Commands: Follows all commands and directions without difficulty  Safety Judgment: Good awareness of safety precautions  Awareness of Errors: Good awareness of errors made  Deficits: Fully aware of deficits  Attention Span: Appears intact  Bed Mobility  Bed Mobility  Supine>Sit: Independent  Sit>Supine: Independent  Transfers  Transfers  Sit<>Stand: Independent  Bed<>Chair/WC: Barrister's clerk  Distance:  150  Assistance Needs: Independent  Assistive Device Used: Front wheeled walker  Gait quality/descriptors: Decreased weight shift;Decreased step length  Stairs  Stairs  Number of steps: 8  Style: Step to  Assistance Needs: Set up or clean up assistance  Assistive Device Used: 1 railing;Single point cane    Treatment  Therapeutic Exercise  Therapeutic Exercise  Therapeutic Exercise Time Entry: 10  Therapeutic Exercise Activity 1: she performed AP and knee flex/ext in a seated position  Therapeutic Activity  Therapeutic Activity  Therapeutic Activity Time Entry: 20  Therapeutic Activity 1: PT addressed sit to stand transfers and secured a standing BP which was 128/75 and she reported no postural sx. She then amb 150' with her FWW remaining stable and returned to her room. PT secured the steps, and  she ascends/descends 8 using 1 HR and SPC and returned to her room with her husband in attendance. PT has cleared her to DC and her nurse was informed.   Neuromuscular Re-Education     Hydrologist Management     Other Activities           PT Assessment Results: Decreased strength;Decreased mobility;Pain(L knee pain)  Impact on Function and Participation: This patient safely performed sit to stand transfers, was not postural, and amb 150' using a FWW and ascends/descends 8 steps using a SPC and the HR. She is cleared to return home and her nurse was informed.  Prognosis: Excellent       Treatment/Interventions: Therapeutic exercise;Therapeutic functional activities;Neuromuscular re-education;Gait/Stairs training  PT Plan: Skilled PT  PT Dosage: 1-2x/day, 5-6 days/week  PT Discharge Recommendations: Follow up with PT at next level of care (to be determined by primary team)        Equipment Recommended: Front wheeled walker      Goals  STG  Goal: Transfer with AD  Estimated Completion Date: 05/02/20  Goal Status: Progressing  STG  Goal: Amb with AD  Estimated Completion Date:  05/02/20  Goal Status: Progressing  STG  Goal: Stairs  Estimated Completion Date: 05/02/20  Goal Status: Progressing                                                 Heide Spark, PT  05/02/2020

## 2020-05-02 NOTE — Discharge Instructions (Signed)
Dr. Manner 's Discharge Instructions Following Knee Replacement  . Blood Clot Prevention  Continue to take Aspirin 81mg twice a day for the next six weeks to prevent blood clots. You will need to wear your compressions stocking at all times (except when showering) until your first follow-up clinic appointment.  Signs of a blood clot in the leg can include pain, redness and warmth in the calf area. Should you have these signs, please contact the clinic or after hours covering physician.      . Pain Management  You are being discharged with several medications for pain management, both narcotic and non-narcotic. Always begin with ice and elevation, and then move to medication. Please follow the guidelines below. The medications work best and are designed to be taken together.    Scheduled Medication:  1. Acetaminophen (Tylenol) 1000mg by mouth every 8 hours. Do not exceed 3000mg Tylenol in a 24 hour period. Tylenol potentiates narcotics and patients report improved pain relief when used in combination. Take Tylenol in a scheduled fashion for at least the first 5 days following surgery, after which you may transition to Tylenol as needed.     2. Celebrex 200mg by mouth twice daily for 2 week. Celebrex is a non-narcotic, anti-inflammatory and pain reliever. Celebrex has a lower risk of stomach upset than traditional anti-inflammatories.   As Needed Medications:  If the above regimen does not provide adequate relief, you may add the following;   1. Oxycodone 5-10mg by mouth every 4 hours for severe pain. Oxycodone is a potent narcotic reserved for severe pain only.  Oxycodone should be sparingly in the first days to few weeks following surgery. You should wean off oxycodone as soon as you are able to tolerate.     2. Tramadol 50-100mg by mouth every 6 hours as needed for moderate pain. Tramadol is a less potent narcotic used to treat mild to moderate pain. It is okay to take Tramadol in addition to oxycodone, however use  caution as this may cause sedation.    *Per [place] State Law, you can choose to partially fill a narcotic prescription. If you do so, your pharmacy will keep the remainder of the prescription on-file for 4 weeks. You may fill the remaining prescription (pills) anytime within that 4 week period.     Narcotics can be constipating. In addition to the Senna prescribed, you may add over the counter Miralax as needed should constipation be a problem. Be sure to drink plenty of water while taking narcotics.     *You will have the ability to obtain narcotic refills at your follow-up clinic visit in 2 weeks. Should you need a refill prior to that, please call the clinic [phone]. Please note, narcotic prescriptions cannot be faxed or called in to a pharmacy, thus the clinic requires a 2 business day notice for refills.     . Wound Care  You must remove your surgical dressing and shower / use soap on post-operative day 4. Dr. Manner prefers his patients to gently wash the incision with soap, and then pat dry. No scrubbing. The clear tape-looking dressing directly on your incision should remain in place until your first follow-up with Dr. Manner. It is okay to get this clear tape-looking dressing wet. No soaking the incision such as in a bath or hot tub. No swimming.     . Ice  Continue to use ice for the next several days after discharge. We recommend filling 3 or 4 gallon   size ziplock bag with 90% water and 10% rubbing alcohol. Place them in the freezer and rotate use. In this solution, it will freeze into a slushy consistency, allowing easier use against the incision. Apply the ice three to four times daily for 20-30 minutes at a time.       . Nausea  You are being discharged with an anti-nausea medication, Ondansetron (Zofran). You may take this as needed for nausea. Keep in mind that constipation can sometimes be the culprit of nausea, so also evaluate whether or not you are having regular bowel movements.      . Preventing Infection  After knee replacement surgery, you will have the lifelong precaution of needing a prophylactic antibiotic for any invasive procedure (dental cleaning, colonoscopy, etc). If you have questions about whether or not an antibiotic is required for a procedure, call Dr. Manner's office and speak with the nurse.     Avoid any elective or non-urgent procedures for three months following surgery. If you have diabetes, controlling your blood sugars is important in wound healing and preventing infection. If you develop an infection in another area of your body (bladder, skin, ear, etc), be sure that your primary care provider is aware that you have an artificial joint.         . Activity  You should begin outpatient physical therapy within a few days of discharge following knee replacement surgery. You may progressively increase your activities as tolerated after completing physical therapy. Activities such as walking, gardening and golf are acceptable. Activities such as swimming and riding a stationary bike help with joint range of motion and strengthening. Remember, it is also important to aggressively work on knee extension (straightening) immediately after surgery. You may resume sexual activity as soon as it is comfortable for you.     . Driving  You may not be ready to resume driving for a few weeks. If you had surgery on your right leg, you should not drive for at least two weeks. If your surgery was on your left leg, you may return to driving as you feel comfortable, providing that you have an automatic transmission. No driving while taking narcotics.     . Return to Work  This depends on your profession. Typically, if your work is primarily sedentary you may return after 3-4 weeks. If your work is vigorous, you may require 2-3 months before you can return to full duty. Dr. Manner can assist in determining when it's appropriate for you to return to work.    . Indications to call Dr.  Manner  o Fever greater than 101 degrees.   o Changes in your incision; opening, draining, redness, tenderness  o Numbness, tingling or loss of function in your operate leg.   o Increasing pain not treated with pain medications.   Monday through Friday call the clinic [phone]. After hours or on the weekends, call [phone] and request the Orthopaedic Resident Physician covering Dr. Manner.

## 2020-05-02 NOTE — Progress Notes (Signed)
Illness Severity  Stable.      Patient Summary  L TKA revision by Dr. Catalina Gravel. Pain controlled with prn 10mg  oxy q4, effective.     Left knee dressing on CDI. Slept most of the night. No n/v noted this shift. Medicated with Oxycodone twice for pain with good effect.      Action Items  Seen by PT  Foley to be pull out this morning.      Situational Awareness  A/Ox4

## 2020-05-02 NOTE — Progress Notes (Signed)
Progress Note     Autumn Grana Muska [name]") - DOB: 03/04/1966 (54 year old female)  Gender Identity: Female  Preferred Pronouns: she/her/hers  Admit Date: 05/01/2020  Code Status: Needs Review     CHIEF CONCERN / IDENTIFICATION:  Autumn Camacho is a 54 year old female now s/p revision L TKA on 05/01/20 with Dr. Catalina Gravel.       SUBJECTIVE     Surgical/Procedural Cases on this Admission     Case IDs Date Procedure Surgeon Location Status    [id number] 05/01/20 LEFT KNEE REVISION TOTAL ARTHROPLASTY Manner, Vevelyn Royals, MD [facility]          INTERVAL HISTORY:  Doing well this morning, pain well controlled.         OBJECTIVE     Vitals (Most recent in last 24 hrs)     T: 37 C (05/02/20 1210)  BP: 109/69 (05/02/20 1210)  HR: 77 (05/02/20 1210)  RR: 17 (05/02/20 1210)  SpO2: 96 % (05/02/20 1210)  T range: Temp  Min: 36.2 C  Max: 37 C  Admit weight: 77.3 kg (170 lb 6.7 oz) (05/01/20 0804)  Last weight: 81.9 kg (180 lb 8.9 oz) (05/01/20 1345)       I&Os:     Intake/Output Summary (Last 24 hours) at 05/02/2020 1742  Last data filed at 05/02/2020 1030  Intake 4382 ml   Output 3250 ml   Net 1132 ml       Physical Exam  Gen: Pt lying in bed, supine     Resp: Breathing comfortably     Neuro: Answering all questions appropriately       Left Lower Extremity Musculoskeletal Exam     Inspection: dressing clean/dry/intact    Sensory: Sensation intact to light touch: deep/superficial peroneal, tibial, sural, saphenous nerves     Motor: Firing extensor / flexor hallucis longus, gastrocnemius - soleus complex, tibialis anterior.     Vascular: Warm to touch      Labs (last 24 hours):   Chemistries  CBC  LFT  Gases, other   139 109 8 81   10.7   AST: - ALT: -  -/-/-/-  -/-/-/-   4.3 24 0.51   4.81 >< 182  AP: - T bili: -  Lact (a): - Lact (v): -   eGFR: >60 Ca: 8.3   34   Prot: - Alb: -  Trop I: - D-dimer: -   Mg: - PO4: -  ANC: -     BNP: - Anti-Xa: -     ALC: -    INR: -                ASSESSMENT/PLAN     Autumn Camacho is a 54 year old female now s/p revision L TKA on 05/01/20 with Dr. Catalina Gravel.  -WBAT, ROMAT  -ASA for DVT ppx  -PT/OT

## 2020-05-02 NOTE — Progress Notes (Signed)
Occupational Therapy Evaluation/Treatment     Patient Name: Autumn Camacho  MRN: [id number]  Today's Date: 05/02/2020    General Visit Information  General  Documentation Type: Initial Eval  Treatment Start Time: 5366  Treatment End Time: 4403  Treatment Duration (min): 50 Minutes    Patient Active Problem List   Diagnosis   . Rheumatoid arthritis involving multiple sites ([place])   . Chronic right hip pain   . Rheumatoid arthritis ([place])   . Rheumatoid arthritis involving right hand ([place])   . 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 Osteoarthritis of right hip   . Osteoarthritis of right knee   . Knee dislocation, left, initial encounter   . Failed total left knee replacement ([place])   . Chronic instability of left knee   . Failed total knee replacement, subsequent encounter       Past Medical History:   Diagnosis Date   . Chronic right hip pain 02/26/2019   . Fracture     Bilateral elbow fx   . Rheumatoid arthritis ([place])    . Rheumatoid arthritis ([place])    . Spinal stenosis    . 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 foot Bilateral     Arch surgery   . KNEE ARTHROPLASTY      LEFT 2018 REPAIR 2019   . PR ADENOIDECTOMY PRIMARY <AGE 38      AGE 54  T/A    . PR TONSILLECTOMY ONE-HALF <AGE 38     . PR UNLISTED PROCEDURE FEMUR/KNEE Bilateral     TKA   . PR UNLISTED PROCEDURE FOOT/TOES Bilateral 2009 R,  2010 L    Reconstructive surgery   . TKA Left 2019    revision from prior TKA       Subjective  Subjective  Patient Report/Self-Assessment: Pain: 4 sedentary.   Patient Stated Goal: Home dep on bp.     Home Living  Home Living  Type of Home: House  Entry Stairs: 9  Entry Stairs Railings: Right railing  Indoor Stairs: stairs to basement, but won't be using.   Lives With: Spouse;Daughter(provide asssist. )  Bathroom Shower/Tub: Tub/shower unit(shower seat. 2  gb's in shower. )  Bathroom Toilet: ADA height(Pt reports husband will acquire bsc over toilet. )  Mobility Equipment Owned: Front wheeled or pick-up walker;Single point cane  Durable Medical Equipment Owned: Shower chair without back;Grab bars in shower;Hand-held shower head    Prior Function  Prior Function  BADLs: Requires assist(husband bathing/tx, le drsg, )  IADLs: Requires assist(fam: laundry, housekeeping. Pt: meals, driving, shop online.)  Functional Mobility: Independent(brace on pta, no fww, spc when out. 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   Assistance Available at Home: 24 hour assistance      Vitals:    05/02/20 1055 05/02/20 1100 05/02/20 1105 05/02/20 1210   BP: 96/66 107/75 117/74 109/69   BP Site:    Left Arm   Pulse: 65   77   Resp:    17   Temp:    37 C   TempSrc:    Oral   SpO2: 98%   96%   Weight:       Height:            Lying  Sitting   Standing   Extremity Assessments  RUE Assessment  RUE Assessment: Exceptions to WFL(arthritis in bilat hand and bilat elbows. )                       LUE Assessment  LUE Assessment: Exceptions to [facility]                           Static Standing Balance  Static Standing Balance  Static Standing Balance Level of Assistance: Independent  Dynamic Standing Balance  Dynamic Standing Balance  Dynamic Standing Balance Level of Assist: Independent    Self-Care/Home Management  Self-Care/Home Management  Time Entry (min): 25 Minutes  Grooming: Independent;Set up or clean up assistance;Position- sitting  Upper Body Dressing: Independent;Position- sitting  Lower Body Dressing: Moderate Assist (between 25-49% help needed)(pants eob. reports husband will perform. )  Putting on/taking off footwear: Maximum Assist (between 50-74% help needed)(husband to assist with compr socks, has slip on shoes. )  Toileting: Independent(has raised seat and bilat hand holds. )  Toileting Comments: Supplied with vendor and equip pix for commode over toilet and ttb if needed. Pt  and husband report understanding.   Bathing: Other (Comment)(Initial sup recommended, has seat. )  Other IADL: Assist as needed initially.     Cognition  Cognition  Overall Cognitive Status: Within Functional Limits  Orientation Level: Oriented X4         Bed Mobility  Bed Mobility  Supine>Sit: Independent(to eob with hob up. )  Transfers  Transfers  Sit<>Stand: Independent(fww, extra time. )  Bed<>Chair/WC: Independent(fww)  Toilet Transfer: Independent(sim home setup. )  Tub Transfer: Other (Comment)(rec initial sponge bathing, tub tx with assist/sup)  Tub Transfer Equipment Used: Other (Comment)(Rec not performing until able to 100% wb through left le. )     Functional Mobility  Functional Mobility  Distance: sba with fww in room, short distance, barrier left knee pain.   Activity Tolerance  Activity Tolerance  Endurance: Tolerates 30 min of activity with multiple rests    AMPAC  AMPAC  AMPAC Assessment Needed?: Yes  Help from another person eating meals: None  Help from another person taking care of personal grooming: A Little  Help from another person to put on/take off upper body clothing: A Little  Help from another person to put on/take off lower body clothing: A Little  Help from another person toileting: None  Help from another person bathing: A Lot  Total score: 19    Assessment/Plan  OT Assessment Results: Impaired ADL status;Impaired functional mobility  Impact on Function and Participation: Pt lives with husband in home with main level living, has tub/shower with seat, hh shower hose and gb, husband assisted with tx, ada toilet with bilat hand holds. Husband assisted with le drsg pta, and some hls. CLOF: ind bed mob to eob with hob up, ind sit/stand, toilet tx sim home setup. Mod donning pants, has slip on shoes. Rec initial sponge bathing. Husband to assist with bathing, le drsg as pta. Anticipate d/c home with husband once cleared by PT and medical. No further OT needs.   Prognosis: Good  Barriers to  Discharge: None  Evaluation/Treatment Tolerance: Patient limited by pain          OT Plan: No skilled OT        OT Discharge Recommendations: No follow up OT indicated        Equipment Recommended: Other ([employer])(rev ttb  and bsc over toilet if needed. )    Goals  STG  Goal: Lower body dressing;Moderate Assist (between 25-49% help needed)  Estimated Completion Date: 05/02/20  Goal Status: Met  STG  Goal: Toilet transfer;Toileting;Independent  Estimated Completion Date: 05/02/20  Goal Status: Met  STG  Goal: Other (Comment)(fc mob in room with fww: sba)  Estimated Completion Date: 05/02/20  Goal Status: [place], OT  05/02/2020

## 2020-05-02 NOTE — Nursing Note (Signed)
ADT Time: 1250  Transported From: 4w  To:  home  Transportation Method:  wheelchair  Accompanied By:  transport  Condition on Departure: stable   Comments:  Cleared for safe discharge by therapies. Discharge and follow-up instructions, prescriptions given at this time. Questions answered, pt. Expresses understanding.

## 2020-05-03 ENCOUNTER — Encounter (INDEPENDENT_AMBULATORY_CARE_PROVIDER_SITE_OTHER): Payer: No Typology Code available for payment source | Admitting: Orthopaedic Surgery

## 2020-05-06 ENCOUNTER — Other Ambulatory Visit: Payer: Self-pay

## 2020-05-07 ENCOUNTER — Encounter (HOSPITAL_BASED_OUTPATIENT_CLINIC_OR_DEPARTMENT_OTHER): Payer: Self-pay

## 2020-05-07 ENCOUNTER — Ambulatory Visit
Payer: No Typology Code available for payment source | Attending: Physician Assistant | Admitting: Rehabilitative and Restorative Service Providers"

## 2020-05-07 ENCOUNTER — Telehealth (INDEPENDENT_AMBULATORY_CARE_PROVIDER_SITE_OTHER): Payer: Self-pay | Admitting: Orthopaedic Surgery

## 2020-05-07 DIAGNOSIS — G8929 Other chronic pain: Secondary | ICD-10-CM | POA: Insufficient documentation

## 2020-05-07 DIAGNOSIS — G8918 Other acute postprocedural pain: Secondary | ICD-10-CM

## 2020-05-07 DIAGNOSIS — Z96652 Presence of left artificial knee joint: Secondary | ICD-10-CM | POA: Insufficient documentation

## 2020-05-07 DIAGNOSIS — R262 Difficulty in walking, not elsewhere classified: Secondary | ICD-10-CM | POA: Insufficient documentation

## 2020-05-07 DIAGNOSIS — M25551 Pain in right hip: Secondary | ICD-10-CM | POA: Insufficient documentation

## 2020-05-07 NOTE — Telephone Encounter (Signed)
Patient is s/p Revision L TKA DOS: 05/01/20    Patient is requesting a refill on her oxycodone prescription. Patient states she has about 3 days worth left and is taking 1 pill/4 hours.     Preferred pharmacy:  [facility], [place] [address]  Phone #: [phone]    Routing to clinical staff to assist.

## 2020-05-07 NOTE — Progress Notes (Signed)
PHYSICAL THERAPY INITIAL PLAN OF CARE            PLAN OF CARE DUE: 07/06/20  Progress note due: 06/06/20     VISITS FROM SOC:   1      INTERPRETER  STATUS (Not needed, Telephonic, In Person):not needed    Referring Provider:  Adele Barthel   Attending Physician: Rondall Allegra  Diagnosis:     ICD-10-CM    1. S/P total knee replacement, left  Z96.652    2. Difficulty in walking, not elsewhere classified  R26.2         PRECAUTIONS:  WBAT, Aggressive ROM     Mechanism of Injury: failed TKA, with instability    Pertinent Medical/Surgical History/Current Medications that may affect progress in PT:   Patient Active Problem List   Diagnosis   . Rheumatoid arthritis involving multiple sites ([place])   . Chronic right hip pain   . Rheumatoid arthritis ([place])   . Rheumatoid arthritis involving right hand ([place])   . 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 Osteoarthritis of right hip   . Osteoarthritis of right knee   . Knee dislocation, left, initial encounter   . Failed total left knee replacement ([place])   . Chronic instability of left knee   . Failed total knee replacement, subsequent encounter       Past Medical History:   Diagnosis Date   . Chronic right hip pain 02/26/2019   . Fracture     Bilateral elbow fx   . Rheumatoid arthritis ([place])    . Rheumatoid arthritis ([place])    . Spinal stenosis    . Tooth disorder     MISSING TOOTH UPPER LEFT BACK-MOLAR     Past Surgical History:   Procedure Laterality Date   . foot Bilateral     Arch surgery   . KNEE ARTHROPLASTY      LEFT 2018 REPAIR 2019   . PR ADENOIDECTOMY PRIMARY <AGE 45      AGE 63  T/A    . PR TONSILLECTOMY ONE-HALF <AGE 45     . PR UNLISTED PROCEDURE FEMUR/KNEE Bilateral     TKA   . PR UNLISTED PROCEDURE FOOT/TOES Bilateral 2009 R,  2010 L    Reconstructive surgery   . TKA Left 2019    revision from prior TKA     RIGHT HIP xray 01/16/20 IMPRESSION  FINDINGS AND IMPRESSION:  No  acute fractures or malalignment.    Severe DJD of the right hip with joint space narrowing and mild lateral subluxation of the femoral head relative to the acetabulum. The left hip is unremarkable.    Rightward L3-4 and leftward L4-5 degenerative laterolisthesis, 4 mm and 8 mm respectively.  Severe L4-5 and L5-S1 disc height loss and facet arthropathy.     ____________________________________________________________________     Previous Therapy: not since surgery  Prior Level of Function:  Before onset of the current condition, the patient was able to walk without assistive device, climb stairs, dress and personal care independently, cook      Home Environment: house, 4 stairs to enter- one rail    SUBJECTIVE:  Patient's Statement: In March, Autumn Camacho woke up and was stretching out in bed when her knee dislocated. She was relocated in the emergency room and placed in knee immobilizer. Due to delays in surgeries, she was not able to have her revision  performed until last week. She feels very good after surgery and her knee feels much more stable. She does continue to be limited due to pain in her right hip, and hopes to have that replaced once her left leg is strong again.     Patient Goals:be able to walk without assistive device, climb stairs without solely relying on right leg, put socks and shoes on independently, stand to cook, resume walking program    OBJECTIVE MEASURES / IMPAIRMENTS / ACTIVITY LIMITATIONS:  Pain: 8/10  Left knee, sometimes medial, sometimes anterior    Gait- using front wheel walker- full weight bearing through L  Using single point cane ( using on L due to more pain with right hip) stable, no deviations/ feels good    Stairs: using step to , able to perform with one rail and cane relying on R LE. Consistent grind from right hip audible during loading and movement    Observation- incision is clean, dry with minimal eschar , no steri strips. Warm.     Ankle ROM 5-70  Knee ROM 0-100 ( 110 post  treatment)    Strength- able to perform quad set  Able to perform SLR 10x ( cue to keep R LE straight due to grind of right hip during lift of L, improved with straight leg)  Not able to perform hip abduction without pain at proximal tibia    TREATMENT & EDUCATION    Primary Learner: Patient  Topics Taught: purpose of interventions and initial HEP instructions  Challenges Impacting this Teaching: None  Desire and Motivation to Learn: Engaging with education, ask questions  Preferred Learning Style: self-directed practice  Post Education Response:  Demonstrates tasks independently    Patient's learning needs, abilities, preferences and readiness per Initial POC were considered in this session.  Learning verified by teach back.    Evaluation Code: 25053 for 30 minutes.  Clinical Presentation for Selection of Evaluation Code: Unstable  Clinical Decision Making Complexity for Selection of Eval Code:High      Intervention:  97673 Manual Therapy for 15 minutes                           MFR lateral and anterior thigh  PROM knee flexion        Time in:   0800  Total Treatment Minutes:   45   Total Timed Code Minutes:  15    Home Exercise Program   Heel slides 10x2  SLR 10x2  Side lying hip abd/ER with pillow between thighs 10x2  Continue with seated AROM     a handout was provided describing the exercises in written and picture format    ASSESSMENT:    Autumn Camacho presents with limited knee range status post surgery. She is tolerating full weight bearing well and demonstrating good quad recruitment. She will benefit from intervention to work on full range, strength, and endurance.    Factors/barriers that may delay or affect course of care include: pertinent past medical history as listed above, Chronicity or severity and Comorbidities, complications, impairment, the following cultural factors: none.    Outcomes Score: LEFS 10      FALL RISK:    low fall risk based on screening- using appropriate assistive device. Will work on  strength and balance during therapy         PLAN:   Pain will be addressed at the next plan of care review, and intermittently during daily visits  as deemed appropriate by the provider.  The focus of daily visits will be on function.    Therapy Treatment Plan (Frequency/Duration/Other Follow up): Patient will be seen for 2 visits per week  until plan of care reviewed.    Plan for next visit:  PROM/ MFR as indicated. Stationary bike if right hip can tolerate- may start with nu step. Leg press if able, side lying hip strengthening ( or prone if able to tolerate)    To improve impairments and reach functional goals the following interventions will be performed: Patient Education, Manual Therapy [phone]), Therapeutic Exercise [phone]), Therapeutic Activities [phone]) and Gait Training [phone])      TREATMENT GOALS  Anticipated Discharge Date 07/08/20  LONG TERM GOALS   (Functional Goals)  Current Level of Function /  Participation Restrictions   Patient will be able to climb stairs using left LE Not able to step up or down off low step   Patient will be able to perform all self care independently Requires assist with dressing, cooking        Short Term Goals To be met by MET or UNMET / Date    Patient will be able to perform 10" step up 5x 06/07/20    Patient will have knee flexion to 120 in order to improve ability to reach feet for socks and shoes 06/07/20             Patient assisted in establishing goals and Patient agrees with plan.    Autumn Camacho, PT  [facility]  Phone: [phone]   FAX: [phone]

## 2020-05-08 MED ORDER — OXYCODONE HCL 5 MG OR TABS
5.0000 mg | ORAL_TABLET | ORAL | 0 refills | Status: DC | PRN
Start: 2020-05-08 — End: 2020-05-15

## 2020-05-08 NOTE — Telephone Encounter (Signed)
Spoke with patient and informed that requested Oxycodone medication has been approved and sent to preferred pharmacy [facility] [phone] [place] [phone] . Advised patient to call pharmacy to know when would it be ready for pick up. Patient expressed appreciation of the call.

## 2020-05-10 ENCOUNTER — Encounter (INDEPENDENT_AMBULATORY_CARE_PROVIDER_SITE_OTHER): Payer: Self-pay | Admitting: Physician Assistant

## 2020-05-10 ENCOUNTER — Ambulatory Visit (INDEPENDENT_AMBULATORY_CARE_PROVIDER_SITE_OTHER): Payer: Medicare Other | Admitting: Physician Assistant

## 2020-05-10 VITALS — BP 122/74 | HR 86 | Ht 61.0 in | Wt 165.0 lb

## 2020-05-10 DIAGNOSIS — Z96652 Presence of left artificial knee joint: Secondary | ICD-10-CM

## 2020-05-10 NOTE — Patient Instructions (Signed)
Call with concerns or health changes.

## 2020-05-10 NOTE — Progress Notes (Signed)
Patient's Primary Care Physician: Terressa Koyanagi, MD    Diagnosis: 9 days s/p L knee revision arthroplasty    Ms. Krus returns for follow-up today for the L knee revision.    HPI:    She is doing well.  Her pain is controlled, and her incisional tenderness is improving without an increase in redness or swelling.  She is taking oxycodone, celebrex, tramadol and tylenol   for pain.  She is ambulating well.    Physical therapy: She is attending 3 times weekly.    ROS:  The patient reports no tingling, numbness, or weakness in the affected extremity.    EXAM:  Ms. Landen is oriented to time, place, and person.  Her gait is mildly antalgic.     Left knee: Incision is well healed, without drainage, erythema, or excessive warmth.  No effusion.  ROM well tolerated.  Stable in valgus/varus.  Patella tracks centrally.  Calves are supple, non-tender.     IMPRESSION:    Doing well s/p L knee revision.      PLAN:  1.  Keep incision clean and dry.  2.  Antibiotic prophylaxis for dental work and invasive procedures such as colonoscopy were reviewed, as was the importance of routine radiographic surveillance of joint replacements.  3.  Follow-up will be in 4 weeks with x-rays.  4.  Physical therapy: Orders for formal outpatient PT written for patient.    5.  Pain Medication: Not needed       Norina Buzzard, PA-C  Teaching Associate  Dept of Orthopaedics and Sports Medicine  Joint Replacement/Hip and [place]

## 2020-05-10 NOTE — Addendum Note (Signed)
Addended by: Crista Elliot CHRISTIAN A on: 05/10/2020 09:23 AM     Modules accepted: Orders, SmartSet

## 2020-05-13 NOTE — Progress Notes (Signed)
PHYSICAL THERAPY TREATMENT NOTE  __________________________________________________________________    VISITS FROM SOC:  2    INTERPRETER  STATUS (Not needed, Telephonic, In Person):not needed    Referring Provider:  Adele Barthel   Attending Physician: Rondall Allegra  Diagnosis:     ICD-10-CM    1. S/P total knee replacement, left  Z96.652    2. Difficulty in walking, not elsewhere classified  R26.2         PRECAUTIONS:  WBAT, Aggressive ROM     Mechanism of Injury: failed TKA, with instability    Pertinent Medical/Surgical History/Current Medications that may affect progress in PT:       Patient Active Problem List   Diagnosis   . Rheumatoid arthritis involving multiple sites ([place])   . Chronic right hip pain   . Rheumatoid arthritis ([place])   . Rheumatoid arthritis involving right hand ([place])   . 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 Osteoarthritis of right hip   . Osteoarthritis of right knee   . Knee dislocation, left, initial encounter   . Failed total left knee replacement ([place])   . Chronic instability of left knee   . Failed total knee replacement, subsequent encounter            Past Medical History:   Diagnosis Date   . Chronic right hip pain 02/26/2019   . Fracture     Bilateral elbow fx   . Rheumatoid arthritis ([place])    . Rheumatoid arthritis ([place])    . Spinal stenosis    . Tooth disorder     MISSING TOOTH UPPER LEFT BACK-MOLAR           Past Surgical History:   Procedure Laterality Date   . foot Bilateral     Arch surgery   . KNEE ARTHROPLASTY      LEFT 2018 REPAIR 2019   . PR ADENOIDECTOMY PRIMARY <AGE 70      AGE 53  T/A    . PR TONSILLECTOMY ONE-HALF <AGE 70     . PR UNLISTED PROCEDURE FEMUR/KNEE Bilateral     TKA   . PR UNLISTED PROCEDURE FOOT/TOES Bilateral 2009 R,  2010 L    Reconstructive surgery   . TKA Left 2019    revision from prior TKA     RIGHT HIP xray 01/16/20  IMPRESSION  FINDINGS AND IMPRESSION:  No acute fractures or malalignment.    Severe DJD of the right hip with joint space narrowing and mild lateral subluxation of the femoral head relative to the acetabulum. The left hip is unremarkable.    Rightward L3-4 and leftward L4-5 degenerative laterolisthesis, 4 mm and 8 mm respectively. Severe L4-5 and L5-S1 disc height loss and facet arthropathy.     ____________________________________________________________________     Previous Therapy: not since surgery  Prior Level of Function:  Before onset of the current condition, the patient was able to walk without assistive device, climb stairs, dress and personal care independently, cook      Home Environment: house, 4 stairs to enter- one rail    SUBJECTIVE:  Patient's Statement: She reports that she is doing great.  She has been doing her PT exercises daily as instructed.  She feels less pain and more mobility.  Her R hip is bothering her a lot lately.  She feels that she needs [facility] for her R hip more than for her  L knee.        OBJECTIVE MEASURES/IMPAIRMENTS:  AROM: 0-120 R knee flex (0-110 on 7/20)    TREATMENT/SKILLED INTERVENTION & EDUCATION:    Intervention 1:   97140 Manual Therapy for 15 minutes  PROM L knee flex and ext  L patella mobs: grade IV all direction--good mobility with no pain c/o                       Intervention 2:  97110 Therapeutic exercise for 30 minutes                           Stationary bike: seat height #4, able to do full cycle/forward and backward cycle total of ~ 3 min free spin/no resistance    Stair: self L knee flex mobs on step + self knee flex stretch--pt c/o R hip pain, hold for now    Shuttle:  Double knee squat with  37lb  Uni squat: L knee with x 10 18lb     Assured her to ice her L knee when she gets home    Intervention 3:  .       Patient's learning needs, abilities, preferences and readiness per Initial POC were considered in this session.  Learning verified by teach  back.    Time in: 11:10  Total Treatment Minutes:   45  Total Timed Code Minutes:  [place] (HEP):   Heel slides 10x2  SLR 10x2  Side lying hip abd/ER with pillow between thighs 10x2  Continue with seated AROM     04/2720:  sidelying hip abd 3 x 10 1 x a day    verbal instructions and demonstrations were provided for the above exercises  a handout was provided describing the exercises in written and picture format  the exercises that were provided last session were reviewed with the patient today  the patient was able to return demonstrate the exercises accurately after instructions     Assessment / Patient Response to Therapy:   Autumn Camacho demonstrates great improvement of her left knee active range of motion and quadriceps control today.  She participates and tolerates advancing further weight bearing activities.  She will benefit from ongoing PT to achieve her PT goals.     PLAN:    Continue per treatment plan of care.  Pain will be addressed at the next plan of care review, and intermittently during daily visits as deemed appropriate by the provider.  The focus of daily visits will be on function    Plan for next visit:   Cont to monitor her L knee AROM progression  Cont shuttle, stationary bike    L knee: SQA and LAQ  Prone knee flex stretch it pt tolerates prone position    Cont to monitor and adjust ex as needed for her R hip as well        Doyce Loose, PTA  [facility]  Phone: [phone]   FAX: [phone]

## 2020-05-14 ENCOUNTER — Other Ambulatory Visit
Admit: 2020-05-14 | Discharge: 2020-05-14 | Disposition: A | Discharge status: Home / Self Care | Payer: No Typology Code available for payment source

## 2020-05-14 ENCOUNTER — Ambulatory Visit (HOSPITAL_BASED_OUTPATIENT_CLINIC_OR_DEPARTMENT_OTHER): Payer: No Typology Code available for payment source | Admitting: Rehabilitative and Restorative Service Providers"

## 2020-05-14 DIAGNOSIS — Z96652 Presence of left artificial knee joint: Secondary | ICD-10-CM

## 2020-05-15 ENCOUNTER — Telehealth (INDEPENDENT_AMBULATORY_CARE_PROVIDER_SITE_OTHER): Payer: Self-pay | Admitting: Physician Assistant

## 2020-05-15 DIAGNOSIS — Z96652 Presence of left artificial knee joint: Secondary | ICD-10-CM

## 2020-05-15 DIAGNOSIS — G8918 Other acute postprocedural pain: Secondary | ICD-10-CM

## 2020-05-15 MED ORDER — OXYCODONE HCL 5 MG OR TABS
5.0000 mg | ORAL_TABLET | Freq: Four times a day (QID) | ORAL | 0 refills | Status: DC | PRN
Start: 2020-05-15 — End: 2020-06-14

## 2020-05-15 NOTE — Telephone Encounter (Signed)
Called patient, let her know oxycodone refill prescription has been approved and sent to the [facility] on [address]. Advised patient to call pharmacy to see when it will be ready to pick up. Patient stated understanding, appreciated callback

## 2020-05-15 NOTE — Telephone Encounter (Signed)
Patient s/p Revision L TKA DOS: 05/01/20    Patient requesting a refill of oxycodone. Patient states she has 11 left.    Pharmacy:  [id number] [place] [phone]   [address] [place], [place] [address]   Phone:  [phone] Fax:  [phone]     Routing to clinical staff to assist.

## 2020-05-15 NOTE — Telephone Encounter (Signed)
Patient s/p Rev LTKA on 05/01/20, requesting oxycodone refill     [place] Medication Dispense History (from 05/21/2019 to 05/15/2020)     Dispensed Days Supply Quantity   OXYCODONE HCL 5 MG TABLET 05/08/2020 3 42 unspecified   OXYCODONE HCL 5 MG TABLET 05/02/2020 5 60 unspecified   TRAMADOL HCL 50 MG TABLET 05/02/2020 7 28 unspecified   GABAPENTIN 300 MG CAPSULE 06/16/2019 30 60 unspecified   GABAPENTIN 300 MG CAPSULE 06/16/2019 30 60 unspecified   GABAPENTIN 300 MG CAPSULE 06/16/2019 30 60 unspecified

## 2020-05-16 ENCOUNTER — Ambulatory Visit (HOSPITAL_BASED_OUTPATIENT_CLINIC_OR_DEPARTMENT_OTHER): Payer: No Typology Code available for payment source | Admitting: Rehabilitative and Restorative Service Providers"

## 2020-05-16 DIAGNOSIS — R262 Difficulty in walking, not elsewhere classified: Secondary | ICD-10-CM

## 2020-05-16 DIAGNOSIS — M25551 Pain in right hip: Secondary | ICD-10-CM

## 2020-05-16 DIAGNOSIS — G8929 Other chronic pain: Secondary | ICD-10-CM

## 2020-05-16 DIAGNOSIS — Z96652 Presence of left artificial knee joint: Secondary | ICD-10-CM

## 2020-05-16 NOTE — Progress Notes (Signed)
PHYSICAL THERAPY TREATMENT NOTE  __________________________________________________________________    VISITS FROM SOC:  3    INTERPRETER  STATUS (Not needed, Telephonic, In Person):not needed    Referring Provider:  Adele Barthel   Attending Physician: Rondall Allegra  Diagnosis:     ICD-10-CM    1. S/P total knee replacement, left  Z96.652    2. Difficulty in walking, not elsewhere classified  R26.2         PRECAUTIONS:  WBAT, Aggressive ROM     Mechanism of Injury: failed TKA, with instability    Pertinent Medical/Surgical History/Current Medications that may affect progress in PT:       Patient Active Problem List   Diagnosis   . Rheumatoid arthritis involving multiple sites ([place])   . Chronic right hip pain   . Rheumatoid arthritis ([place])   . Rheumatoid arthritis involving right hand ([place])   . 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 Osteoarthritis of right hip   . Osteoarthritis of right knee   . Knee dislocation, left, initial encounter   . Failed total left knee replacement ([place])   . Chronic instability of left knee   . Failed total knee replacement, subsequent encounter            Past Medical History:   Diagnosis Date   . Chronic right hip pain 02/26/2019   . Fracture     Bilateral elbow fx   . Rheumatoid arthritis ([place])    . Rheumatoid arthritis ([place])    . Spinal stenosis    . Tooth disorder     MISSING TOOTH UPPER LEFT BACK-MOLAR           Past Surgical History:   Procedure Laterality Date   . foot Bilateral     Arch surgery   . KNEE ARTHROPLASTY      LEFT 2018 REPAIR 2019   . PR ADENOIDECTOMY PRIMARY <AGE 46      AGE 77  T/A    . PR TONSILLECTOMY ONE-HALF <AGE 46     . PR UNLISTED PROCEDURE FEMUR/KNEE Bilateral     TKA   . PR UNLISTED PROCEDURE FOOT/TOES Bilateral 2009 R,  2010 L    Reconstructive surgery   . TKA Left 2019    revision from prior TKA     RIGHT HIP xray 01/16/20  IMPRESSION  FINDINGS AND IMPRESSION:  No acute fractures or malalignment.    Severe DJD of the right hip with joint space narrowing and mild lateral subluxation of the femoral head relative to the acetabulum. The left hip is unremarkable.    Rightward L3-4 and leftward L4-5 degenerative laterolisthesis, 4 mm and 8 mm respectively. Severe L4-5 and L5-S1 disc height loss and facet arthropathy.     ____________________________________________________________________     Previous Therapy: not since surgery  Prior Level of Function:  Before onset of the current condition, the patient was able to walk without assistive device, climb stairs, dress and personal care independently, cook      Home Environment: house, 4 stairs to enter- one rail    SUBJECTIVE:  Patient's Statement: Very sore the day after last visit from exercises in clinic. Patient reports (R) hip is most sore/painful. Has trouble finding ways to be comfortable in bed. Has not done HEP due to pain but "always does ROM for knee." Icing, tylenol, oxycodone, meloxicam for pain management/anti inflammatory.  OBJECTIVE MEASURES/IMPAIRMENTS:  AROM: 0-120 R knee flex on 05-16-2020 (0-110 on 7/20)    TREATMENT/SKILLED INTERVENTION & EDUCATION:    Intervention 1:   97140 Manual Therapy for 15 minutes  PROM L knee flex and ext  L patella mobs: grade IV all direction--good mobility with no pain c/o                       Intervention 2:  97110 Therapeutic exercise for 30 minutes                           Stationary bike: seat height #4, able to do full cycle/forward and backward cycle total of ~ 5 min free spin/no resistance    Shuttle:  Double knee squat with  37lb (did not do today)   Uni squat: L knee with x 10 18lb     Intervention 3:  .       Patient's learning needs, abilities, preferences and readiness per Initial POC were considered in this session.  Learning verified by teach back.    Time in: 1345  Total Treatment Minutes:   40  Total Timed Code  Minutes:  [place] (HEP):   Heel slides 10x2  SLR 10x2  Side lying hip abd/ER with pillow between thighs 10x2  Continue with seated AROM     04/2720:  sidelying hip abd 3 x 10 1 x a day    verbal instructions and demonstrations were provided for the above exercises  a handout was provided describing the exercises in written and picture format  the exercises that were provided last session were reviewed with the patient today  the patient was able to return demonstrate the exercises accurately after instructions     Assessment / Patient Response to Therapy:   Autumn Camacho demonstrates great improvement of her left knee active range of motion and quadriceps control today.  She participates and tolerates advancing further weight bearing activities.  The patient will continue to benefit from skilled physical therapy to address impairments and reach functional goals set by primary physical therapist.     PLAN:    Continue per treatment plan of care.  Pain will be addressed at the next plan of care review, and intermittently during daily visits as deemed appropriate by the provider.  The focus of daily visits will be on function    Plan for next visit:   Cont to monitor her L knee AROM progression  Cont shuttle, stationary bike  L knee: SQA and LAQ  Prone knee flex stretch it pt tolerates prone position    Cont to monitor and adjust ex as needed for her R hip as well        Manning Charity, PTA  [facility]  [facility]  Phone: [phone]   FAX: [phone]

## 2020-05-20 ENCOUNTER — Encounter (HOSPITAL_BASED_OUTPATIENT_CLINIC_OR_DEPARTMENT_OTHER): Payer: No Typology Code available for payment source | Admitting: Rehabilitative and Restorative Service Providers"

## 2020-05-20 ENCOUNTER — Other Ambulatory Visit: Payer: Self-pay

## 2020-05-20 NOTE — Progress Notes (Deleted)
PHYSICAL THERAPY TREATMENT NOTE  __________________________________________________________________    VISITS FROM SOC:  Visit count could not be calculated. Make sure you are using a visit which is associated with an episode.    INTERPRETER  STATUS (Not needed, Telephonic, In Person):not needed    Referring Provider:  Adele Barthel   Attending Physician: Rondall Allegra  Diagnosis:     ICD-10-CM    1. S/P total knee replacement, left  Z96.652    2. Difficulty in walking, not elsewhere classified  R26.2         PRECAUTIONS:  WBAT, Aggressive ROM     Mechanism of Injury: failed TKA, with instability    Pertinent Medical/Surgical History/Current Medications that may affect progress in PT:       Patient Active Problem List   Diagnosis   . Rheumatoid arthritis involving multiple sites ([place])   . Chronic right hip pain   . Rheumatoid arthritis ([place])   . Rheumatoid arthritis involving right hand ([place])   . 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 Osteoarthritis of right hip   . Osteoarthritis of right knee   . Knee dislocation, left, initial encounter   . Failed total left knee replacement ([place])   . Chronic instability of left knee   . Failed total knee replacement, subsequent encounter            Past Medical History:   Diagnosis Date   . Chronic right hip pain 02/26/2019   . Fracture     Bilateral elbow fx   . Rheumatoid arthritis ([place])    . Rheumatoid arthritis ([place])    . Spinal stenosis    . Tooth disorder     MISSING TOOTH UPPER LEFT BACK-MOLAR           Past Surgical History:   Procedure Laterality Date   . foot Bilateral     Arch surgery   . KNEE ARTHROPLASTY      LEFT 2018 REPAIR 2019   . PR ADENOIDECTOMY PRIMARY <AGE 39      AGE 64  T/A    . PR TONSILLECTOMY ONE-HALF <AGE 39     . PR UNLISTED PROCEDURE FEMUR/KNEE Bilateral     TKA   . PR UNLISTED PROCEDURE FOOT/TOES Bilateral 2009 R,  2010 L     Reconstructive surgery   . TKA Left 2019    revision from prior TKA     RIGHT HIP xray 01/16/20 IMPRESSION  FINDINGS AND IMPRESSION:  No acute fractures or malalignment.    Severe DJD of the right hip with joint space narrowing and mild lateral subluxation of the femoral head relative to the acetabulum. The left hip is unremarkable.    Rightward L3-4 and leftward L4-5 degenerative laterolisthesis, 4 mm and 8 mm respectively. Severe L4-5 and L5-S1 disc height loss and facet arthropathy.     ____________________________________________________________________     Previous Therapy: not since surgery  Prior Level of Function:  Before onset of the current condition, the patient was able to walk without assistive device, climb stairs, dress and personal care independently, cook      Home Environment: house, 4 stairs to enter- one rail    SUBJECTIVE:  Patient's Statement: ***  Very sore the day after last visit from exercises in clinic. Patient reports (R) hip is most sore/painful. Has trouble finding ways to be comfortable in bed. Has not done HEP  due to pain but "always does ROM for knee." Icing, tylenol, oxycodone, meloxicam for pain management/anti inflammatory.     OBJECTIVE MEASURES/IMPAIRMENTS: ***  AROM: 0-120 R knee flex on 05-16-2020 (0-110 on 7/20)    TREATMENT/SKILLED INTERVENTION & EDUCATION: ***    Intervention 1:   97140 Manual Therapy for 15 minutes  PROM L knee flex and ext  L patella mobs: grade IV all direction--good mobility with no pain c/o                       Intervention 2:  97110 Therapeutic exercise for 30 minutes                           Stationary bike: seat height #4, able to do full cycle/forward and backward cycle total of ~ 5 min free spin/no resistance    Shuttle:  Double knee squat with  37lb (did not do today)   Uni squat: L knee with x 10 18lb     Intervention 3:  .       Patient's learning needs, abilities, preferences and readiness per Initial POC were considered in this  session.  Learning verified by teach back.    Time in: ***  Total Treatment Minutes:     Total Timed Code Minutes:       HOME EXERCISE PROGRAM (HEP): ***  Heel slides 10x2  SLR 10x2  Side lying hip abd/ER with pillow between thighs 10x2  Continue with seated AROM     04/2720:  sidelying hip abd 3 x 10 1 x a day    verbal instructions and demonstrations were provided for the above exercises  a handout was provided describing the exercises in written and picture format  the exercises that were provided last session were reviewed with the patient today  the patient was able to return demonstrate the exercises accurately after instructions     Assessment / Patient Response to Therapy: ***  Autumn Camacho demonstrates great improvement of her left knee active range of motion and quadriceps control today.  She participates and tolerates advancing further weight bearing activities.  The patient will continue to benefit from skilled physical therapy to address impairments and reach functional goals set by primary physical therapist.     PLAN:  ***  Continue per treatment plan of care.  Pain will be addressed at the next plan of care review, and intermittently during daily visits as deemed appropriate by the provider.  The focus of daily visits will be on function    Plan for next visit:   Cont to monitor her L knee AROM progression  Cont shuttle, stationary bike  L knee: SQA and LAQ  Prone knee flex stretch it pt tolerates prone position    Cont to monitor and adjust ex as needed for her R hip as well        Manning Charity, PTA  [facility]  [facility]  Phone: [phone]   FAX: [phone]

## 2020-05-21 ENCOUNTER — Other Ambulatory Visit: Payer: Self-pay

## 2020-05-22 ENCOUNTER — Encounter (HOSPITAL_BASED_OUTPATIENT_CLINIC_OR_DEPARTMENT_OTHER): Payer: No Typology Code available for payment source | Admitting: Rehabilitative and Restorative Service Providers"

## 2020-05-24 ENCOUNTER — Encounter (HOSPITAL_BASED_OUTPATIENT_CLINIC_OR_DEPARTMENT_OTHER): Payer: No Typology Code available for payment source | Admitting: Rehabilitative and Restorative Service Providers"

## 2020-05-26 NOTE — Progress Notes (Signed)
PHYSICAL THERAPY TREATMENT NOTE  __________________________________________________________________    VISITS FROM SOC:  4    INTERPRETER  STATUS (Not needed, Telephonic, In Person):not needed    Referring Provider:  Adele Barthel   Attending Physician: Rondall Allegra  Diagnosis:     ICD-10-CM    1. S/P total knee replacement, left  Z96.652    2. Difficulty in walking, not elsewhere classified  R26.2         PRECAUTIONS:  WBAT, Aggressive ROM     Mechanism of Injury: failed TKA, with instability    Pertinent Medical/Surgical History/Current Medications that may affect progress in PT:       Patient Active Problem List   Diagnosis   . Rheumatoid arthritis involving multiple sites ([place])   . Chronic right hip pain   . Rheumatoid arthritis ([place])   . Rheumatoid arthritis involving right hand ([place])   . 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 Osteoarthritis of right hip   . Osteoarthritis of right knee   . Knee dislocation, left, initial encounter   . Failed total left knee replacement ([place])   . Chronic instability of left knee   . Failed total knee replacement, subsequent encounter            Past Medical History:   Diagnosis Date   . Chronic right hip pain 02/26/2019   . Fracture     Bilateral elbow fx   . Rheumatoid arthritis ([place])    . Rheumatoid arthritis ([place])    . Spinal stenosis    . Tooth disorder     MISSING TOOTH UPPER LEFT BACK-MOLAR           Past Surgical History:   Procedure Laterality Date   . foot Bilateral     Arch surgery   . KNEE ARTHROPLASTY      LEFT 2018 REPAIR 2019   . PR ADENOIDECTOMY PRIMARY <AGE 54      AGE 54  T/A    . PR TONSILLECTOMY ONE-HALF <AGE 54     . PR UNLISTED PROCEDURE FEMUR/KNEE Bilateral     TKA   . PR UNLISTED PROCEDURE FOOT/TOES Bilateral 2009 R,  2010 L    Reconstructive surgery   . TKA Left 2019    revision from prior TKA     RIGHT HIP xray 01/16/20  IMPRESSION  FINDINGS AND IMPRESSION:  No acute fractures or malalignment.    Severe DJD of the right hip with joint space narrowing and mild lateral subluxation of the femoral head relative to the acetabulum. The left hip is unremarkable.    Rightward L3-4 and leftward L4-5 degenerative laterolisthesis, 4 mm and 8 mm respectively. Severe L4-5 and L5-S1 disc height loss and facet arthropathy.     ____________________________________________________________________     Previous Therapy: not since surgery  Prior Level of Function:  Before onset of the current condition, the patient was able to walk without assistive device, climb stairs, dress and personal care independently, cook      Home Environment: house, 4 stairs to enter- one rail    SUBJECTIVE:  Patient's Statement: Walking without assistive device, no narcotics, sleeping well, cooked dinner 2x/week. (R) hip continues to be painful. Felt good after last visit. Good compliance with HEP.       OBJECTIVE MEASURES/IMPAIRMENTS:     AROM: 0-125 R knee flex on 05-16-2020 (0-110 on 7/20)  (L) knee  pain 52/10 at most, (R) hip 52/10.     TREATMENT/SKILLED INTERVENTION & EDUCATION:                   Intervention 1:  97110 Therapeutic exercise for 30 minutes                           Stationary bike: seat height #4, able to do full cycle/forward and backward cycle total of ~ 7 min L2   Shuttle: Uni squat: L knee with 2 x 10 18lb, 1x 10 25 lbs   S/L Clamshells with TrA 2x10 (B)   Hip add with yellow ball, 10" hold x10   LAQ 10" hold x10   Benefits of aquatic exercises     Intervention 2:  .     Patient's learning needs, abilities, preferences and readiness per Initial POC were considered in this session.  Learning verified by teach back.    Time in: 1350  Total Treatment Minutes:  35  Total Timed Code Minutes: [place] (HEP):   Heel slides 10x2  SLR 10x2  Side lying hip abd/ER with pillow between thighs 10x2  Continue with seated AROM      04/2720:  sidelying hip abd 3 x 10 1 x a day    05-27-20:  LAQ   Hip add + TrA   Clamshells no resistance  + TrA    verbal instructions and demonstrations were provided for the above exercises  a handout was provided describing the exercises in written and picture format  the exercises that were provided last session were reviewed with the patient today  the patient was able to return demonstrate the exercises accurately after instructions     Assessment / Patient Response to Therapy:  Lindzey demonstrates great improvement of her left knee active range of motion and quadriceps control today - she was able to progress quad strengthening activities without complaints of increased pain or discomfort. Discussed use of a rowing machine but did not recommend due to (R) hip pain. Patient stated that she used to swim for exercise so I suggested aquatic exercises/water walking once her (R) knee incision is completely healed. She participates and tolerates advancing further weight bearing activities.  The patient will continue to benefit from skilled physical therapy to address impairments and reach functional goals set by primary physical therapist.     PLAN:   Continue per treatment plan of care. Patient will see primary therapist at next visit.   Pain will be addressed at the next plan of care review, and intermittently during daily visits as deemed appropriate by the provider.  The focus of daily visits will be on function    Plan for next visit:   Cont to monitor her L knee AROM progression  Cont shuttle, stationary bike  Prone knee flex stretch it pt tolerates prone position  Balance/proprioceptive work   Step ups/step downs       Cont to monitor and adjust ex as needed for her R hip as well        Manning Charity, PTA  [facility]  [facility]  Phone: [phone]   FAX: [phone]

## 2020-05-27 ENCOUNTER — Ambulatory Visit
Payer: No Typology Code available for payment source | Attending: Physician Assistant | Admitting: Rehabilitative and Restorative Service Providers"

## 2020-05-27 DIAGNOSIS — G8929 Other chronic pain: Secondary | ICD-10-CM | POA: Insufficient documentation

## 2020-05-27 DIAGNOSIS — M25551 Pain in right hip: Secondary | ICD-10-CM | POA: Insufficient documentation

## 2020-05-27 DIAGNOSIS — Z96652 Presence of left artificial knee joint: Secondary | ICD-10-CM | POA: Insufficient documentation

## 2020-05-27 DIAGNOSIS — R262 Difficulty in walking, not elsewhere classified: Secondary | ICD-10-CM | POA: Insufficient documentation

## 2020-05-29 ENCOUNTER — Ambulatory Visit (HOSPITAL_BASED_OUTPATIENT_CLINIC_OR_DEPARTMENT_OTHER): Payer: No Typology Code available for payment source | Admitting: Rehabilitative and Restorative Service Providers"

## 2020-05-29 ENCOUNTER — Telehealth (INDEPENDENT_AMBULATORY_CARE_PROVIDER_SITE_OTHER): Payer: Self-pay | Admitting: Orthopaedic Surgery

## 2020-05-29 DIAGNOSIS — Z96652 Presence of left artificial knee joint: Secondary | ICD-10-CM

## 2020-05-29 DIAGNOSIS — R262 Difficulty in walking, not elsewhere classified: Secondary | ICD-10-CM

## 2020-05-29 NOTE — Telephone Encounter (Signed)
Patient left message on Nichole's voicemail inquiring about scheduling R THA with Dr. Catalina Gravel.  Per Laurey Arrow, she can schedule about 3 months from recent L Rev TKA on 05/01/20 provided her recovery is going well.  Left message for patient asking her to call back when she is ready to discuss available date options for R THA with Dr. Catalina Gravel.  Will await response from patient prior to proceeding further with scheduling.

## 2020-05-29 NOTE — Progress Notes (Signed)
PHYSICAL THERAPY TREATMENT NOTE  __________________________________________________________________    VISITS FROM SOC:  5    INTERPRETER STATUS (Not needed, Telephonic, In Person):not needed  Referring Provider: Adele Barthel   Attending Physician:Paul Manner  Diagnosis:    ICD-10-CM    1. S/P total knee replacement, left  Z96.652    2. Difficulty in walking, not elsewhere classified  R26.2        PRECAUTIONS:WBAT, Aggressive ROM        SUBJECTIVE:  Patient's Statement: Autumn Camacho reports her knee feels great! It is her right hip that is limiting her mobility. She is not leaving the house unless absolutely necessary because the stairs are too painful. Her exercises are also painful. She is trying to schedule a surgery for her hip because she is losing more and more mobility.    OBJECTIVE MEASURES/IMPAIRMENTS:  Left knee flexion 130  Gait- compensated trendelenberg to R    TREATMENT/SKILLED INTERVENTION & EDUCATION:  Intervention 1:   97110 Therapeutic exercise for 30 minutes                    SLR Left 12x, R - good quad set but not able to lift hip without pain  Hook lying posterior tilt with glute squeeze and slight lift- stay in range without pain, add light abd resistance with yellow band- able to perform 8x2 without increase pain  Side lying hip abd/ER- painful with movement into IR. Add towel to block end range, add yellow band and limit ER range to stay painfree 8x2      Patient's learning needs, abilities, preferences and readiness per Initial POC were considered in this session.  Learning verified by teach back.    Time in: 0930  Total Treatment Minutes:   30  Total Timed Code Minutes:  [place] (HEP):   Side lying hip abd/ER with towel to block IR, yellow band for isom ER  Hook lying posterior tilt with slight lift , yellow band   SLR L  Walk with cane to protect R hip    the patient was able to return demonstrate the exercises accurately after instructions      Assessment / Patient Response to Therapy:   Autumn Camacho is not having any issues with her post operative knee, but is limited in her progression of activity and exercise due to her right hip pain. She responded well to modifications to her strengthening program; however,  it is appropriate for her to discuss surgical options at this time due to the severity of her pain and lack of function on her right side.  She was encouraged to use her cane to off load her right hip and normalize her gait.    PLAN:    Continue per treatment plan of care.  Pain will be addressed at the next plan of care review, and intermittently during daily visits as deemed appropriate by the provider.  The focus of daily visits will be on function    Plan for next visit: continue strengthening as able.     Vanice Sarah, PT  [facility]  Phone: [phone]   FAX: [phone]

## 2020-05-31 ENCOUNTER — Ambulatory Visit (HOSPITAL_BASED_OUTPATIENT_CLINIC_OR_DEPARTMENT_OTHER): Payer: No Typology Code available for payment source | Admitting: Rehabilitative and Restorative Service Providers"

## 2020-05-31 DIAGNOSIS — R262 Difficulty in walking, not elsewhere classified: Secondary | ICD-10-CM

## 2020-05-31 DIAGNOSIS — Z96652 Presence of left artificial knee joint: Secondary | ICD-10-CM

## 2020-05-31 NOTE — Progress Notes (Deleted)
PHYSICAL THERAPY TREATMENT NOTE  __________________________________________________________________    VISITS FROM SOC:  Visit count could not be calculated. Make sure you are using a visit which is associated with an episode.    INTERPRETER STATUS (Not needed, Telephonic, In Person):not needed  Referring Provider: Adele Barthel   Attending Physician:Paul Manner  Diagnosis:    ICD-10-CM    1. S/P total knee replacement, left  Z96.652    2. Difficulty in walking, not elsewhere classified  R26.2        PRECAUTIONS:WBAT, Aggressive ROM        SUBJECTIVE:  Patient's Statement: ***    OBJECTIVE MEASURES/IMPAIRMENTS:  ***    TREATMENT/SKILLED INTERVENTION & EDUCATION:  Intervention 1:   {PT INTERVENTIONS/SKILLED SERVICES:500251371}     Plan for next visit: continue strengthening as able                 SLR Left 12x, R - good quad set but not able to lift hip without pain  Hook lying posterior tilt with glute squeeze and slight lift- stay in range without pain, add light abd resistance with yellow band- able to perform 8x2 without increase pain  Side lying hip abd/ER- painful with movement into IR. Add towel to block end range, add yellow band and limit ER range to stay painfree 8x2      Intervention 2:  {PT INTERVENTIONS/SKILLED SERVICES:500251371}                                 Intervention 3:  {PT INTERVENTIONS/SKILLED SERVICES:500251371}       Patient's learning needs, abilities, preferences and readiness per Initial POC were considered in this session.  Learning verified by teach back.    Time in: ***  Total Treatment Minutes:   ***  Total Timed Code Minutes:  ***      HOME EXERCISE PROGRAM (HEP):   Side lying hip abd/ER with towel to block IR, yellow band for isom ER  Hook lying posterior tilt with slight lift , yellow band   SLR L  Walk with cane to protect R hip    {PT EDUCATION ([place]/ETC):105852}     Assessment / Patient Response to Therapy:   ***    PLAN:    Continue per treatment plan  of care.  Pain will be addressed at the next plan of care review, and intermittently during daily visits as deemed appropriate by the provider.  The focus of daily visits will be on function    Plan for next visit: ***    Acquanetta Chain  [facility]  Phone: [phone]   FAX: [phone]

## 2020-06-02 NOTE — Progress Notes (Signed)
PHYSICAL THERAPY TREATMENT NOTE  __________________________________________________________________    VISITS FROM SOC:  6    INTERPRETER STATUS (Not needed, Telephonic, In Person):not needed  Referring Provider: Adele Barthel   Attending Physician:Paul Manner  Diagnosis:    ICD-10-CM    1. S/P total knee replacement, left  Z96.652    2. Difficulty in walking, not elsewhere classified  R26.2        PRECAUTIONS:WBAT, Aggressive ROM    SUBJECTIVE:  Patient's Statement: She reports her R hip surgery is scheduled now in October 12.  Her L knee is doing great. L knee incision is healing up well and swelling has been down.  Her R hip pain continues to bother her a lot. It limits all her mobilities, but she understands that she would be better stays active to prepare her next surgery and she has been doing her best to do her PT exercises.  She is happy that her left leg is becoming stronger that she is able to go up/down stairs with her L leg.       OBJECTIVE MEASURES/IMPAIRMENTS:  L knee 0-132    TREATMENT/SKILLED INTERVENTION & EDUCATION:    Intervention 1:   97110 Therapeutic exercise for 40 minutes                    Reviewed briefly about:Scar tissue mobilization and Manual patella mobs in all directions    HEP review:  Seated knee ext/LAQ  Supine heel slides with core--for R hip up as tol  SLR Left with core up to fatigue/ 12x 2  SLR Right--good quad set but not able to lift hip without pain~only performed few reps, given manual assist    Supine hook lying posterior tilt with glute squeeze and slight lift- stay in range without pain--good demo   Hook lying L Hip abd with yellow band- x 10  Hook lying applied manual isometrics on R hip ER 10 sec hold x 10 reps, able to perform within optimum symptoms range    Tried side lying hip abd/ER- continues to be very painful, hold off for today    Prone with pillow under abdomen--knee flex with core activation-new  Prone with pillow under abdomen--R  knee flex stretch-new    (no time, will try next visit) Shuttle uni/L knee squat and L heel raises      Intervention 2:  .                                 Intervention 3:  .       Patient's learning needs, abilities, preferences and readiness per Initial POC were considered in this session.  Learning verified by teach back.    Time in: 1335  Total Treatment Minutes:   40  Total Timed Code Minutes:  [place] (HEP):   Heel slides 10x2  SLR 10x2--for LLE, monitor R due to R hip pain  Side lying hip abd/ER with pillow between thighs 10x2(L only, R up as tol)  Continue with seated AROM        05-27-20:  LAQ   Hip add + TrA   Clamshells no resistance  + TrA    05/29/20  Side lying hip abd/ER with towel to block IR, yellow band for isom ER  Hook lying posterior tilt with slight lift , yellow band   SLR L  Walk  with cane to protect R hip    05/31/20:(place a pillow under abdomen)  Prone knee flex with core(R and L)  Prone knee flex stretch for L knee      verbal instructions and demonstrations were provided for the above exercises  a handout was provided describing the exercises in written and picture format     Assessment / Patient Response to Therapy:   Autumn Camacho continues to work hard to improve/preserve optimum left knee range of motion and to advance her lower extremity strength.  She reports that now she is able to go up/down stairs leading with her left lower extremity, thus her right hip pain continues to restrict her mobility.  Her right hip surgery is now scheduled in October and she is motivated to keep working on strengthening towards her goals.  She is with excellent compliance and independence with her PT home program.  She will benefit from ongoing PT.       PLAN:    Continue per treatment plan of care.  Pain will be addressed at the next plan of care review, and intermittently during daily visits as deemed appropriate by the provider.  The focus of daily visits will be on function    Plan for  next visit: Continue per treatment plan of care.  Pain will be addressed at the next plan of care review, and intermittently during daily visits as deemed appropriate by the provider.  The focus of daily visits will be on function    Plan for next visit for advancing strengthening     Shuttle uni knee squat and heel raises  Step up/downs as tolerates  Stationary bike/Nu step?/LLE only RLE is off   SLS on L        Doyce Loose, PTA  [facility]  Phone: [phone]   FAX: [phone]

## 2020-06-03 NOTE — Progress Notes (Signed)
PHYSICAL THERAPY TREATMENT NOTE  __________________________________________________________________    VISITS FROM SOC:  7    INTERPRETER STATUS (Not needed, Telephonic, In Person):not needed  Referring Provider: Adele Barthel   Attending Physician:Paul Manner  Diagnosis:    ICD-10-CM    1. S/P total knee replacement, left  Z96.652    2. Difficulty in walking, not elsewhere classified  R26.2        PRECAUTIONS:WBAT, Aggressive ROM    SUBJECTIVE:  Patient's Statement: She continues to feel better with her L leg strength.  Her R hip is not hurting as much.  She feels that she is better with her posture.   She can stand straight since she has started to stretch her back lying down on her stomach.      OBJECTIVE MEASURES/IMPAIRMENTS:  N/T    TREATMENT/SKILLED INTERVENTION & EDUCATION:    Intervention 1:   97110 Therapeutic exercise for 35 minutes      Nu step level 3  Seat 9 BLE and UE--pt reports good LE strength challenge w/o increasing R hip pain      Shuttle L knee squat: 25lb 10 reps + 31lb 2 x 10    Stairs up and down with BUE rail support: independent, pt descends sideway because of R hip pain  Forward step up<->down leading with her L--only tolerated x 5 due to R hip pain  B heel raises--good effort but limited with B toe defformity    Staggered standing position: forward/heel lift and backward/toe lift weight shift 2 x 5 reps--new HEP    Standing partial Uni/L knee squat x 2 x 5 reps with UE support--new HEP    HEP reivew:  Prone with pillow under abdomen--knee flex with core activation--good demo  Prone with pillow under abdomen--L knee flex stretch---good demo    Prone with pillow under abdomen--hip ext with knee ext--new HEP    Intervention 2:  97140 Manual Therapy for 5 minutes                Soft tissue mobilization: B QL, lumbar paraspinals                     Intervention 3:  .       Patient's learning needs, abilities, preferences and readiness per Initial POC were considered in  this session.  Learning verified by teach back.    Time in: 1250  Total Treatment Minutes:   40  Total Timed Code Minutes:  [place] (HEP):   Heel slides 10x2  SLR 10x2--for LLE, monitor R due to R hip pain  Side lying hip abd/ER with pillow between thighs 10x2(L only, R up as tol)  Continue with seated AROM        05-27-20:  LAQ   Hip add + TrA   Clamshells no resistance  + TrA    05/29/20  Side lying hip abd/ER with towel to block IR, yellow band for isom ER  Hook lying posterior tilt with slight lift , yellow band   SLR L  Walk with cane to protect R hip    05/31/20:(place a pillow under abdomen)  Prone knee flex with core(R and L)  Prone knee flex stretch for L knee    06/04/20:  [employer]: Access Code [email or web address]  Prone hip ext R 5 x 2, L 10 x 2  Staggered standing position: weight shift forward/ heel lift and posterior weight shift/ toe lift 2 x  5  Partial L uni squat with UE support 2 x 5    verbal instructions and demonstrations were provided for the above exercises  a handout was provided describing the exercises in written and picture format     Assessment / Patient Response to Therapy:   Autumn Camacho is progressing her left lower extremity strength and demonstrates improved postural stability and gait quality using a single point cane.  She is motivated advancing further weight bearing activities and is improving to manage her right hip symptoms within optimum range.  She will continue to benefit from skilled physical therapy to address impairments and reach functional goals set by primary physical therapist.     PLAN:    Continue per treatment plan of care.  Pain will be addressed at the next plan of care review, and intermittently during daily visits as deemed appropriate by the provider.  The focus of daily visits will be on function    Plan for next visit:   Advancing LLE strengthening monitoring R hip symptoms  Nu step for warm up  Cont->advance shuttle uni squat  SLS on L up as  tolerates        Doyce Loose, PTA  [facility]  [facility]  Phone: [phone]   FAX: [phone]

## 2020-06-04 ENCOUNTER — Ambulatory Visit (HOSPITAL_BASED_OUTPATIENT_CLINIC_OR_DEPARTMENT_OTHER): Payer: No Typology Code available for payment source | Admitting: Rehabilitative and Restorative Service Providers"

## 2020-06-04 ENCOUNTER — Other Ambulatory Visit (INDEPENDENT_AMBULATORY_CARE_PROVIDER_SITE_OTHER): Payer: Self-pay | Admitting: Orthopaedic Surgery

## 2020-06-04 DIAGNOSIS — R262 Difficulty in walking, not elsewhere classified: Secondary | ICD-10-CM

## 2020-06-04 DIAGNOSIS — Z96652 Presence of left artificial knee joint: Secondary | ICD-10-CM

## 2020-06-06 ENCOUNTER — Ambulatory Visit (HOSPITAL_BASED_OUTPATIENT_CLINIC_OR_DEPARTMENT_OTHER): Payer: No Typology Code available for payment source | Admitting: Rehabilitative and Restorative Service Providers"

## 2020-06-06 ENCOUNTER — Ambulatory Visit (INDEPENDENT_AMBULATORY_CARE_PROVIDER_SITE_OTHER): Payer: Medicare Other

## 2020-06-06 DIAGNOSIS — Z96652 Presence of left artificial knee joint: Secondary | ICD-10-CM

## 2020-06-06 DIAGNOSIS — G8929 Other chronic pain: Secondary | ICD-10-CM

## 2020-06-06 DIAGNOSIS — Z23 Encounter for immunization: Secondary | ICD-10-CM

## 2020-06-06 DIAGNOSIS — R262 Difficulty in walking, not elsewhere classified: Secondary | ICD-10-CM

## 2020-06-06 MED ORDER — COVID-19 MRNA VACCINE (PFIZER) 30 MCG/0.3ML IM SUSP
0.3000 mL | Freq: Once | INTRAMUSCULAR | Status: AC
Start: 2020-06-06 — End: 2020-06-06
  Administered 2020-06-06: 0.3 mL via INTRAMUSCULAR

## 2020-06-06 NOTE — Progress Notes (Signed)
PHYSICAL THERAPY DISCHARGE NOTE  __________________________________________________________________    VISITS FROM SOC:  8    INTERPRETER STATUS (Not needed, Telephonic, In Person):not needed  Referring Provider: Adele Barthel   Attending Physician:Paul Manner  Diagnosis:    ICD-10-CM    1. S/P total knee replacement, left  Z96.652    2. Difficulty in walking, not elsewhere classified  R26.2        PRECAUTIONS:WBAT, Aggressive ROM        SUBJECTIVE:  Patient's Statement: Autumn Camacho has her hip surgery planned for October! She continues to work on her leg strengthening and has found that she is getting stronger with the left and relying on it for activities like stairs. She uses a step to method, she goes down side ways because forward is still too much strain on her left knee.     OBJECTIVE MEASURES/IMPAIRMENTS:      TREATMENT/SKILLED INTERVENTION & EDUCATION:  Intervention 1:   97110 Therapeutic exercise for 30 minutes                    Prone on pillow knee flexion 10x R/L  Prone on pillow hip extension L 10x, R perform isometric only  Seated with yellow band hip abd/ER - able to perform without R hip issues  Seated with yellow band hip flexion- perform in mid range- able to perform without hp issues  Stairs- tend to rotate while moving down side ways- cued to keep body in parallel to maintain proper alignment through left knee- cue to hip hinge and use more gluteal strength. Able to demo successfully with report of improved symptoms       Patient's learning needs, abilities, preferences and readiness per Initial POC were considered in this session.  Learning verified by teach back.    Time in: 0945  Total Treatment Minutes:   30  Total Timed Code Minutes:  [place] (HEP):   Prone on pillow hip extension  Prone on pillow knee flexion  Seated with yellow band hip abd/ER  Seated with yellow band hip flexion  LAQ        Assessment / Patient Response to Therapy:   Autumn Camacho has  achieved independence with her program addressing her left knee. She will continue independently at this time. She will be seen after her hip surgery.   Anticipated Discharge Date 07/08/20  LONG TERM GOALS   (Functional Goals)  Current Level of Function /  Participation Restrictions   Patient will be able to climb stairs using left LE Using L as main extremity for climbing and descending stairs. Going down side ways    Patient will be able to perform all self care independently Limitations now are due to R hip        Short Term Goals To be met by MET or UNMET / Date    Patient will be able to perform 10" step up 5x 06/07/20 met- performing stairs   Patient will have knee flexion to 120 in order to improve ability to reach feet for socks and shoes 06/07/20 Met. Flexion is 130       PLAN:    Discharge with home program    Autumn Camacho, PT  [facility]  Phone: [phone]   FAX: [phone]

## 2020-06-06 NOTE — Progress Notes (Signed)
COVID-19 Vaccine Intake Documentation      Pre-Vaccination Screening Questions:         1.  Are you feeling sick today?      . If patient is COVID-19 positive or COVID-19 like symptoms, reschedule when no longer infectious (at least 10 days post positive test, resolution of fever without fever reducing medications, and improvement of symptoms).  If patient is COVID negative, patient should be informed that vaccine can result in flu-like symptoms before proceeding.  Patients no longer have to wait 90 days post COVID infection for the vaccine.          NO       2. Have you ever received a dose of COVID-19 vaccine?     YES         If yes, which vaccine product?   Pfizer         3.  Have you  ever had a severe allergic reaction    (e.g., anaphylaxis) to something?  For example, a reaction for    which you were treated with epinephrine or Epi Pen  or    for which you had to go to the hospital?    . If yes, please review questions below:       NO       . Was the severe allergic reaction after receiving a COVID-19 vaccine?    . If yes to severe reaction (anaphylaxis) after COVID-19 vaccine, HARD STOP, DO NOT ADMINISTER      NO     . Was the severe allergic reaction after receiving another vaccine or another injectable medication?    . If yes to severe reaction (anaphylaxis) after another vaccine/injectable (not COVID-19 vaccine), this is a precaution and can administer vaccine.  Observe 30 minutes.     NO            Patient must attest to the below prior to receiving an additional dose of mRNA COVID-19 vaccine.    1. Patient has completed a primary mRNA COVID-19 vaccine series with at least 28 days since second dose     AND     2.   Patient has or is undergoing any of the following:  a. Active treatment for solid tumor and hematologic malignancies  b. Receipt of solid-organ transplant and taking immunosuppressive therapy   c. Receipt of CAR-T-cell or hematopoietic stem cell transplant (within 2 years of transplantation  or taking immunosuppression therapy)  d. Moderate or severe primary immunodeficiency (e.g., DiGeorge, Wiskott-Aldrich syndromes)  e. Advanced or untreated HIV infection  f. Active treatment with high-dose corticosteroids (i.e., ?20mg  prednisone or equivalent per day), alkylating agents, antimetabolites, transplant-related immunosuppressive drugs, cancer chemotherapeutic agents classified as severely immunosuppressive, TNF blockers, and other biologic agents that are immunosuppressive or immunomodulatory    Does the patient attest to the above?     YES

## 2020-06-14 ENCOUNTER — Ambulatory Visit (INDEPENDENT_AMBULATORY_CARE_PROVIDER_SITE_OTHER): Payer: Medicare Other | Admitting: Orthopaedic Surgery

## 2020-06-14 ENCOUNTER — Other Ambulatory Visit (INDEPENDENT_AMBULATORY_CARE_PROVIDER_SITE_OTHER): Payer: Medicare Other

## 2020-06-14 ENCOUNTER — Encounter (INDEPENDENT_AMBULATORY_CARE_PROVIDER_SITE_OTHER): Payer: Self-pay | Admitting: Orthopaedic Surgery

## 2020-06-14 ENCOUNTER — Inpatient Hospital Stay (INDEPENDENT_AMBULATORY_CARE_PROVIDER_SITE_OTHER)
Admit: 2020-06-14 | Discharge: 2020-06-14 | Disposition: A | Discharge status: Home / Self Care | Payer: No Typology Code available for payment source | Source: Home / Self Care

## 2020-06-14 ENCOUNTER — Ambulatory Visit (INDEPENDENT_AMBULATORY_CARE_PROVIDER_SITE_OTHER)
Admit: 2020-06-14 | Discharge: 2020-06-14 | Disposition: A | Discharge status: Home / Self Care | Payer: No Typology Code available for payment source

## 2020-06-14 VITALS — BP 122/70 | HR 90 | Ht 61.0 in | Wt 165.0 lb

## 2020-06-14 DIAGNOSIS — Z96652 Presence of left artificial knee joint: Secondary | ICD-10-CM

## 2020-06-14 DIAGNOSIS — Z96641 Presence of right artificial hip joint: Secondary | ICD-10-CM

## 2020-06-14 DIAGNOSIS — M1611 Unilateral primary osteoarthritis, right hip: Secondary | ICD-10-CM

## 2020-06-14 DIAGNOSIS — Z471 Aftercare following joint replacement surgery: Secondary | ICD-10-CM

## 2020-06-14 NOTE — Progress Notes (Signed)
Please see the above note by the orthopaedic resident.  I was present with the resident during both the history and physical examination and the results described are corroborated by my own findings.  I discussed the case with the resident, and agree with the findings and plan as documented in that note.      Aalayah Riles, MD  FRCSC  Hip and Knee Arthritis  Professor  Department of Orthopaedics and Sports Medicine  [facility] [place] [facility] [place]

## 2020-06-14 NOTE — Progress Notes (Signed)
Patient's Primary Care Physician: Terressa Koyanagi, MD    Diagnosis: \  1. 6 weeks Post-operative follow up Left revision TKA,   2. Right hip DJD      HPI:  Ms. Doell returns for follow-up today for the the left revision TKA 6 weeks follow up. She is very pleased with her surgery and reports excellent results in terms of function, pain control and ambulation. She still ambulates with a cane due to significant right hip DJD which she is scheduled for a right THA. Taking meloxicam and tylenol for pain. She has completed PT. Denies any n/t. T    ROS:  Ms. Latella reports no tingling, numbness, or weakness in the affected extremity.    EXAM:    On physical exam, looks her stated age.  Ms. Ponti is oriented to time, place, and person.  Her gait is mildly antalgic.     Exam of the left knee. Incision well healed, no erythema or drainage. ROM 0-140 pain free. Stable to varus/valgus stress.  Grossly motor/sensory intact.     Skin temperature and turgor are normal throughout the lower extremities, without swelling, varicosities, or lymphadenopathy.    IMAGING: Radiographs were independently reviewed which demonstrate well positioned CCK revision total knee with well fixed tibial and femoral components without signs of loosening or complication.     Radiographs of the right hip demonstrate significant DJD with subsequent worsening compared to prior radiographs.     IMPRESSION:  Doing well 6 weeks post-operative from left revision CCK TKA. She has significant right hip DJD and will undergo right THA at some point in the next several months.     PLAN:  WBAT, ROMAT  Follow up for pre-operative apt for right THA  PT referral provided    [place] [facility]

## 2020-06-26 ENCOUNTER — Telehealth (HOSPITAL_BASED_OUTPATIENT_CLINIC_OR_DEPARTMENT_OTHER): Payer: Self-pay

## 2020-06-26 NOTE — Progress Notes (Signed)
[facility]  [address] [place]  [address]  TEL: [phone]  l  FAX: [phone]      06/27/2020    PRIMARY CARE PROVIDER:  Terressa Koy[place]agi[place] [address]    CONSULTING PROVIDER:  No ref. provider found              PATIENT: Autumn Camacho    [id number]    Reason for visit:    Rheumatoid arthritis    Last visit:  02/05/2020    HPI:    Diagnosis: rheumatoid arthritis (1999)   Autoimmune Serologies: Anti-CCP pos, RF pos, ANA neg   Medications Used: methotrexate, enbrel, remicade, humira, orencia, xeljanz, rinvoq.      INTERVAL HISTORY:  Autumn Camacho is a 54 year old female who presents for follow-up visit. Since her last visit, she had her left knee replacement redone (following dislocation) in July 2021. She plans to have her right hip replaced in October 2021.  She held Rinvoq prior to and after her left knee arthroplasty, but has since resumed Rinvoq '15mg'$  daily with benefit to joint pain. She has continued meloxicam 7.'5mg'$  daily. She does not have pain in any joints besides her right hip and lower back. She denies increased morning stiffness or side effects from Rinvoq. She wonders if hip replacement may improve lower back pain.    She has had both initial doses of the COVID vaccine and the booster dose.    PHYSICAL EXAMINATION:  Vital signs:  Blood pressure 118/82, pulse 75, temperature 37 C, temperature source Temporal, weight 78.7 kg (173 lb 8 oz), SpO2 99 %.  Skin:  Skin color, texture, turgor normal. No rashes or concerning lesions on exposed skin.  HEENT:  Normocephalic. No masses, lesions, or abnormalities.   Cardiovascular:  Well-perfused. No clubbing, cyanosis, or edema.   Pulmonary/Chest:  Effort normal.   Abdominal:  Exhibits no distension.   Extremities:  Normal, without deformities, edema, or skin discoloration.   Psychiatric:  Mood, memory, affect, and judgment normal.    Neuro:  Grossly normal to observation, gait normal.  Musculoskeletal:   Hands: No tenderness or swelling, ulnar deviation in MCP joints bilaterally  Wrists: No tenderness or swelling, full ROM, negative Tinel's sign  Elbows: Tenderness in right elbow, deformity in elbows bilaterally  Shoulders: No tenderness or swelling, full ROM  Hips: Pain with ROM in right hip, no tenderness in greater trochanter  Knees: No tenderness or swelling  Ankles: No tenderness or swelling, full ROM    Problem List:  Patient Active Problem List   Diagnosis   . Rheumatoid arthritis involving multiple sites ([place])   . Chronic right hip pain   . Rheumatoid arthritis ([place])   . Rheumatoid arthritis involving right hand ([place])   . 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 Osteoarthritis of right hip   . Osteoarthritis of right knee   . Knee dislocation, left, initial encounter   . Failed total left knee replacement ([place])   . Chronic instability of left knee   . Failed total knee replacement, subsequent encounter   . Status post revision of total replacement of left knee       Current Medications:   Current Outpatient Medications:   .  acetaminophen 500 MG tablet, Take 2 tablets (1,000 mg) by mouth 3 times a day., Disp: 90 tablet, Rfl: 0  .  aspirin 81 MG EC tablet, Take 1 tablet (81 mg) by mouth 2 times a day. For 6 weeks to prevent blood clots, Disp: 90 tablet, Rfl: 0  .  celecoxib 200 MG capsule, Take 1 capsule (200 mg) by mouth 2 times a day for 14 days. Take with food., Disp: 28 capsule, Rfl: 0  .  gabapentin 300 MG capsule, Take 300 mg in the morning and 600 mg at bedtime, Disp: 90 capsule, Rfl: 2  .  meloxicam 7.5 MG tablet, , Disp: , Rfl:   .  Multiple Vitamins-Minerals (MULTIVITAL OR), Take 1 tablet by mouth daily., Disp: , Rfl:   .  upadacitinib ER (Rinvoq) 15 MG 24 hour tablet, Take 1 tablet (15 mg) by mouth daily., Disp: 90 tablet, Rfl: 0  .   valACYclovir 1 g tablet, TAKE 1 TABLET(1000 MG) BY MOUTH DAILY (Patient taking differently: Take 1,000 mg by mouth every morning. ), Disp: 30 tablet, Rfl: 5    ALLERGIES:   Allergies as of 06/27/2020 - Reviewed 06/27/2020   Allergen Reaction Noted   . Fentanyl Other 03/20/2020   . Hydromorphone TD:DUKGUR/KYHCWCBJ 03/20/2020   . Infliximab Anaphylaxis 08/17/2018   . Codeine Other 08/17/2018   . Nortriptyline Other 04/08/2020     Past Medical History:   Past Medical History:   Diagnosis Date   . Chronic right hip pain 02/26/2019   . Fracture     Bilateral elbow fx   . Rheumatoid arthritis ([place])    . Rheumatoid arthritis ([place])    . Spinal stenosis    . Tooth disorder     MISSING TOOTH UPPER LEFT BACK-MOLAR     Surgical History:   Past Surgical History:   Procedure Laterality Date   . foot Bilateral     Arch surgery   . KNEE ARTHROPLASTY      LEFT 2018 REPAIR 2019   . PR ADENOIDECTOMY PRIMARY <AGE 48      AGE 18  T/A    . PR ANES; TOTAL KNEE REPLACEMENT Left 04/01/2020   . PR TONSILLECTOMY ONE-HALF <AGE 48     . PR UNLISTED PROCEDURE FEMUR/KNEE Bilateral     TKA   . PR UNLISTED PROCEDURE FEMUR/KNEE      Left revision TKA    . PR UNLISTED PROCEDURE FOOT/TOES Bilateral 2009 R,  2010 L    Reconstructive surgery   . TKA Left 2019    revision from prior TKA     Family History:   Her family history includes Cancer in her father; Heart Attack in her father; Rheumatoid Arthritis in an other family member.  Social History:   She reports that she quit smoking about 6 years ago. Her smoking use included cigarettes. She has a 20.00 pack-year smoking history. She has never used smokeless tobacco. She reports previous alcohol use. She reports that she does not use drugs.     Social History     Social History Narrative    She moved to [place] from [place] in 04/2018.        LABS:   Results for orders placed or performed during the hospital encounter of 05/01/20   Pregnancy Test, Urine   Result Value Ref Range    Specific  Gravity, URN 1.010 1.006 - 1.027 g/mL    Pregnancy Test, URN Negative NRN   CBC  Result Value Ref Range    WBC 4.81 4.3 - 10.0 10*3/uL    RBC 3.43 (L) 3.80 - 5.00 10*6/uL    Hemoglobin 10.7 (L) 11.5 - 15.5 g/dL    Hematocrit 34 %    MCV 98 81 - 98 fL    MCH 31.2 27.3 - 33.6 pg    MCHC 31.8 (L) 32.2 - 36.5 g/dL    Platelet Count 182 150 - 400 10*3/uL    RDW-CV 13.2 11.6 - 14.4 %   Basic Metabolic Panel   Result Value Ref Range    Sodium 139 135 - 145 meq/L    Potassium 4.3 3.6 - 5.2 meq/L    Chloride 109 (H) 98 - 108 meq/L    Carbon Dioxide, Total 24 22 - 32 meq/L    Anion Gap 6 4 - 12    Glucose 81 62 - 125 mg/dL    Urea Nitrogen 8 8 - 21 mg/dL    Creatinine 0.51 0.38 - 1.02 mg/dL    Calcium 8.3 (L) 8.9 - 10.2 mg/dL    eGFR, Calculated >60 >59 mL/min/[1.73_m2]    GFR, Information       Calculated GFR by CKD-EPI equation. Inaccurate with changing renal function. See http://depts.[email or web address].fr.html.       IMAGING/DIAGNOSTIC RESULTS:   XR Knee left - 06/14/2020  FINDINGS AND   IMPRESSION  As before, patient is status post total left knee arthroplasty with revision. Interval resolution of soft tissue and joint space air consistent with resolved postoperative changes. Redemonstration of the inferior patellar avulsion fracture. No acute periprosthetic fracture. No knee effusion.   Limited visualization of the right knee demonstrates the total knee arthroplasty in similar position and alignment compared to prior study.      XR Pelvis - 06/14/2020  FINDINGS AND   IMPRESSION  [Lower lumbar spine and sacrum are partially obscured by overlying bowel gas and stool.] [Degenerative changes and levoscoliosis are seen in the lower lumbar spine.] Sacroiliac joints are symmetric. There is no acute fracture or dislocation. There is stable severe right and mild left hip joint osteoarthritis. Symphysis is intact.       IMPRESSION/RECOMMENDATIONS:     Autumn Camacho is a 54 year old female with history of  left knee replacements who came in today for evaluation of rheumatoid arthritis. She had her left knee replacement redone (following dislocation) in July 2021 and planned to have her right hip replaced in October 2021. She held Rinvoq prior to and after her left knee arthroplasty, and had since resumed with benefit to joint pain. She had continued meloxicam 7.'5mg'$  daily as needed. She did not have pain in any joints besides her right hip and lower back. She denied increased morning stiffness or side effects from Rinvoq. Exam today showed deformity in hands, elbows and feet which was not changed from last visit..    I discussed that the patient's condition appeared to be stable and ordered surveillance lab tests. I counseled her on treatment plans and advised her to maintain Rinvoq and meloxicam to see if her condition continues to respond. I will reach out to her after reviewing the labs.       Assessment:  1. Rheumatoid arthritis     PLAN:  The following was discussed with the patient.  Recommendations are as follows:  1. Continue Rinvoq '15mg'$  po qd.  2. Continue meloxicam 7.'5mg'$  po qd prn.  3. Check labs listed below    - Comprehensive Metabolic Panel  -  CBC with Diff  - C-Reactive Protein  - Erythrocyte Sedimentation Rate  - Lipid Panel; Future      Follow up: 6 months    06/27/2020 @ 5:07 PM - I, Ulice Bold Rudolpho Claxton acted as a Education administrator and documented the service/procedure performed to the best of my knowledge in the presence of Julaine Hua, MD who will provide the final review and authentication.  Signed: Tyson Alias

## 2020-06-26 NOTE — Telephone Encounter (Signed)
RETURN CALL: Voicemail - Detailed Message      SUBJECT:  Appointment Request     REASON FOR VISIT: Status post total hip replacement, right, per referral  PREFERRED DATE/TIME: ASAP  ADDITIONAL INFORMATION: No scheduling instructions. Please call patient to schedule. Thank you.

## 2020-06-27 ENCOUNTER — Encounter (HOSPITAL_BASED_OUTPATIENT_CLINIC_OR_DEPARTMENT_OTHER): Payer: Self-pay | Admitting: Rheumatology

## 2020-06-27 ENCOUNTER — Other Ambulatory Visit (HOSPITAL_BASED_OUTPATIENT_CLINIC_OR_DEPARTMENT_OTHER): Payer: Self-pay | Admitting: Rheumatology

## 2020-06-27 ENCOUNTER — Ambulatory Visit: Payer: Medicare Other | Attending: Rheumatology | Admitting: Rheumatology

## 2020-06-27 VITALS — BP 118/82 | HR 75 | Temp 98.6°F | Wt 173.5 lb

## 2020-06-27 DIAGNOSIS — Z79899 Other long term (current) drug therapy: Secondary | ICD-10-CM | POA: Insufficient documentation

## 2020-06-27 DIAGNOSIS — M0579 Rheumatoid arthritis with rheumatoid factor of multiple sites without organ or systems involvement: Secondary | ICD-10-CM

## 2020-06-27 LAB — CBC, DIFF
% Basophils: 0 %
% Eosinophils: 0 %
% Immature Granulocytes: 0 %
% Lymphocytes: 49 %
% Monocytes: 6 %
% Neutrophils: 45 %
% Nucleated RBC: 0 %
Absolute Eosinophil Count: 0 10*3/uL (ref 0.00–0.50)
Absolute Lymphocyte Count: 2.35 10*3/uL (ref 1.00–4.80)
Basophils: 0.02 10*3/uL (ref 0.00–0.20)
Hematocrit: 44 % (ref 36–45)
Hemoglobin: 13.8 g/dL (ref 11.5–15.5)
Immature Granulocytes: 0.01 10*3/uL (ref 0.00–0.05)
MCH: 30.9 pg (ref 27.3–33.6)
MCHC: 31.7 g/dL — ABNORMAL LOW (ref 32.2–36.5)
MCV: 98 fL (ref 81–98)
Monocytes: 0.29 10*3/uL (ref 0.00–0.80)
Neutrophils: 2.22 10*3/uL (ref 1.80–7.00)
Nucleated RBC: 0 10*3/uL
Platelet Count: 200 10*3/uL (ref 150–400)
RBC: 4.46 10*6/uL (ref 3.80–5.00)
RDW-CV: 13.8 % (ref 11.6–14.4)
WBC: 4.89 10*3/uL (ref 4.3–10.0)

## 2020-06-27 LAB — SED RATE: Erythrocyte Sedimentation Rate: 14 mm/h (ref 0–20)

## 2020-06-27 LAB — COMPREHENSIVE METABOLIC PANEL
ALT (GPT): 11 U/L (ref 7–33)
AST (GOT): 16 U/L (ref 9–38)
Albumin: 4 g/dL (ref 3.5–5.2)
Alkaline Phosphatase (Total): 77 U/L (ref 34–121)
Anion Gap: 9 (ref 4–12)
Bilirubin (Total): 0.2 mg/dL (ref 0.2–1.3)
Calcium: 9.2 mg/dL (ref 8.9–10.2)
Carbon Dioxide, Total: 23 meq/L (ref 22–32)
Chloride: 108 meq/L (ref 98–108)
Creatinine: 0.57 mg/dL (ref 0.38–1.02)
Glucose: 78 mg/dL (ref 62–125)
Potassium: 4 meq/L (ref 3.6–5.2)
Protein (Total): 7 g/dL (ref 6.0–8.2)
Sodium: 140 meq/L (ref 135–145)
Urea Nitrogen: 11 mg/dL (ref 8–21)
eGFR by CKD-EPI: >60 mL/min/{1.73_m2} (ref >59)

## 2020-06-27 LAB — C_REACTIVE PROTEIN: C_Reactive Protein: 0.9 mg/L (ref 0.0–10.0)

## 2020-06-27 LAB — LIPID PANEL
Cholesterol (LDL): 86 mg/dL (ref <130)
Cholesterol/HDL Ratio: 2.9
HDL Cholesterol: 57 mg/dL (ref >39)
Non-HDL Cholesterol: 111 mg/dL (ref 0–159)
Total Cholesterol: 168 mg/dL (ref <200)
Triglyceride: 127 mg/dL (ref <150)

## 2020-06-29 ENCOUNTER — Encounter (HOSPITAL_BASED_OUTPATIENT_CLINIC_OR_DEPARTMENT_OTHER): Payer: Self-pay | Admitting: Rheumatology

## 2020-06-29 NOTE — Progress Notes (Signed)
I, Rashid Whitenight, MD, personally performed the services as described in this documentation. All medical record entries made by the scribe were at my direction and in my presence. I have reviewed the chart and discharge instructions and agree that the record reflects my personal performance and is accurate and complete.

## 2020-07-01 ENCOUNTER — Telehealth (INDEPENDENT_AMBULATORY_CARE_PROVIDER_SITE_OTHER): Payer: Self-pay | Admitting: Orthopaedic Surgery

## 2020-07-01 NOTE — Telephone Encounter (Signed)
Patient is scheduled for DOS 07/30/20 R THA.    Patient states this past weekend her hip 'slipped out of the joint.' Patient states she had to lay on it, to pop it back in. Patient is wondering if there is anything she can do in the mean time until surgery.    Routing to clinical staff to advise.

## 2020-07-02 ENCOUNTER — Telehealth (INDEPENDENT_AMBULATORY_CARE_PROVIDER_SITE_OTHER): Payer: Self-pay | Admitting: Orthopaedic Surgery

## 2020-07-02 NOTE — Telephone Encounter (Signed)
Spoke with patient and let her know per Dr. Catalina Gravel, there is no other new treatment plan to help with the pain and with her joint popping out of place besides surgery.     I explained this to patient and advised her to continue to rest,avoid activities that could exacerbate the pain and possible dislocation.     Take OTC pain medications with food.     Patient appreciated this and asked if there is a compression for the hip.     I advised her may wear a tight cycling shorts for extra support.     Patient liked the idea and will try it.     Patient is looking forward to her preop appointment and DOS.

## 2020-07-02 NOTE — Telephone Encounter (Signed)
Physician performing procedure:  Dr. Catalina Gravel    Surgery Scheduling Logistics:   Procedure:  R THA  Date:  07/30/20  Location of Procedure:  [facility]  Case Status (Inpatient, Outpatient, DSU, Addendum E):  Outpatient   Diagnosis/ICD10:  M16.11  Primary CPT:  27130  Additional CPT(s) including possible procedures:  N/A    Insurance:  [place]:  [employer] Telephone Number:  [phone]  Group Number:  [id number]                   Subscriber ID:  [id number]  Insurance Rep:  RN    Pre Authorization Needed:  yes  PA Number:  [id number]  Authorization Status:  Approved  Reference #:  Same as PA #  Auth Received Date/Time:  07/02/20 @ 2PM  Josem Kaufmann Received Via: Fax    If auth received via website, insert Screenshot here:  Fax, or Letter scanned to media:  yes / Date:  07/02/20    Referral from PCP Needed: no  Second Opinion Needed:  no    Pre-Operative Surgery Requirements:    Preoperative Clearance Needed:  No - done 04/16/20 for L Revision TKA  EKG Needed:  no  Dental Clearance Needed:  no  IMCC:  no

## 2020-07-04 ENCOUNTER — Other Ambulatory Visit (INDEPENDENT_AMBULATORY_CARE_PROVIDER_SITE_OTHER): Payer: Self-pay | Admitting: Orthopaedic Surgery

## 2020-07-04 DIAGNOSIS — Z01812 Encounter for preprocedural laboratory examination: Secondary | ICD-10-CM

## 2020-07-04 DIAGNOSIS — Z20828 Contact with and (suspected) exposure to other viral communicable diseases: Secondary | ICD-10-CM

## 2020-07-10 ENCOUNTER — Other Ambulatory Visit (HOSPITAL_BASED_OUTPATIENT_CLINIC_OR_DEPARTMENT_OTHER): Payer: Self-pay | Admitting: Rheumatology

## 2020-07-10 DIAGNOSIS — M069 Rheumatoid arthritis, unspecified: Secondary | ICD-10-CM

## 2020-07-11 MED ORDER — RINVOQ 15 MG OR TB24
15.0000 mg | EXTENDED_RELEASE_TABLET | Freq: Every day | ORAL | 1 refills | Status: DC
Start: 2020-07-11 — End: 2020-07-31

## 2020-07-12 ENCOUNTER — Ambulatory Visit: Payer: No Typology Code available for payment source

## 2020-07-12 ENCOUNTER — Ambulatory Visit (INDEPENDENT_AMBULATORY_CARE_PROVIDER_SITE_OTHER): Payer: No Typology Code available for payment source | Admitting: Orthopaedic Surgery

## 2020-07-12 ENCOUNTER — Encounter (INDEPENDENT_AMBULATORY_CARE_PROVIDER_SITE_OTHER): Payer: Self-pay | Admitting: Orthopaedic Surgery

## 2020-07-12 VITALS — BP 122/84 | HR 76 | Ht 61.0 in

## 2020-07-12 DIAGNOSIS — Z96641 Presence of right artificial hip joint: Secondary | ICD-10-CM

## 2020-07-12 DIAGNOSIS — Z01818 Encounter for other preprocedural examination: Secondary | ICD-10-CM

## 2020-07-12 DIAGNOSIS — R6889 Other general symptoms and signs: Secondary | ICD-10-CM

## 2020-07-12 NOTE — Progress Notes (Signed)
Patient scheduled for a R THA On 07/30/2020 with Dr. Eddie Dibbles MAnner. Preoperative instruction given per [facility] protocol. [facility] 'Your Surgical Experience' booklet given. Stressed NPO status ten hours prior to surgery Explained what medications to avoid prior to surgery. 4 oz bottle of chlorhexidine soap given as well as instruction for use. Discussed signs and symptoms of possible post-operative complications including infection, DVT, and constipation. The importance of pain control, refill policy on narcotics, and prophylactic antibiotic protocol also explained. No further questions or concerns at this time, patient advised to call if needed.    R/o MRSA swabs obtained in clinic. Provided instruction for prophylactic use of CHG soap for 5 days prior to surgery. Patient stated understanding of these instructions.     Patient scheduled for COVID test on 07/28/2020 at the [facility] garage at [place] order placed.     Patient already had labs done prior to preop appointment. Outpatient physical therapy referral also given at today's clinic visit.     Patient lives at home with her partner who will be able to assist post-operatively.    [place]

## 2020-07-12 NOTE — Progress Notes (Signed)
ORTHOPAEDIC HISTORY AND PHYSICAL EXAMINATION    Patient's Primary Care Physician: Autumn Koyanagi, MD  Referred by: No ref. provider found      CC: R hip DJD.    HPI: Autumn Camacho is still having significant symptoms in the R hip. The pain is severe, despite already having tried non-operative measures, including conservative, non operative measures. We reviewed the alternatives to surgery and the risks of hip replacement surgery, but she feels strongly that total hip arthroplasty is the appropriate next step. She is 2 months s/p L TKA revision.    Outpatient Medications Prior to Visit   Medication Sig Dispense Refill   . acetaminophen 500 MG tablet Take 2 tablets (1,000 mg) by mouth 3 times a day. 90 tablet 0   . aspirin 81 MG EC tablet Take 1 tablet (81 mg) by mouth 2 times a day. For 6 weeks to prevent blood clots 90 tablet 0   . celecoxib 200 MG capsule Take 1 capsule (200 mg) by mouth 2 times a day for 14 days. Take with food. 28 capsule 0   . gabapentin 300 MG capsule Take 300 mg in the morning and 600 mg at bedtime 90 capsule 2   . meloxicam 7.5 MG tablet      . Multiple Vitamins-Minerals (MULTIVITAL OR) Take 1 tablet by mouth daily.     Marland Kitchen upadacitinib ER (Rinvoq) 15 MG 24 hour tablet Take 1 tablet (15 mg) by mouth daily. 90 tablet 1   . valACYclovir 1 g tablet TAKE 1 TABLET(1000 MG) BY MOUTH DAILY 30 tablet 5     No facility-administered medications prior to visit.        ROS:  Autumn Camacho reports no tingling, numbness, or weakness in the affected extremity.    EXAM:  (no height taken for this visit)  (no weight taken for this visit)  There is no height or weight on file to calculate BMI.    HEENT Negative  Chest clear  Coronary RRR  Abdomen soft    Exam of the R hip: Exam of the left hip:  No visible atrophy.  No pain with active straight-leg raise, no pain or crepitus with passive rotation. Greater trochanter is non-tender to palpation.  Quad strength 5/5 against resistance.  Full, supple, pain-free passive  ROM. Marland Kitchen    Distal Neurocirculatory Exam: Pulses intact (2+ PT and DP) bilaterally, sensation intact to light touch bilaterally, EHL motor strength 5/5 bilaterally.       XRAYS:   Radiographs of the right hip demonstrate significant DJD with subsequent worsening compared to prior radiographs.       IMPRESSION: advanced R hip DJD.    PLAN:     At this time, the patient has persistent left hip pain which has failed six months of non-surgical management, including:  NSAIDS, analgesics, light exercise, assistive devices, and complementary medicines.  The patient has severe hip joint disease causing functional limitations impairing age-appropriate activities of daily living.  These activities include, but are not limited to, walking, bathing, dressing, personal hygiene, and normal household chores, as well as fitness and sporting activities.       Following our discussion of options, Autumn Camacho decided that total  hip arthroplasty seems a reasonable option. We discussed that THA is usually a reliable procedure, with published success rates in excess of 90% at 10 years, but that there are a number of risks associated with the procedure. We reviewed the risks of THA today. I explained that  these risks include (but are not limited to): infection, bleeding, nerve injury, thromboembolic disease, medical/anesthetic risks including death, severe cardiac events, and stroke/cerebral impairment, as well as hip-specific risks such as leg-length inequality, dislocation,instability, stiffness, limp or abnormal gait, persistent pain, and possible need for re-operation or revision surgery, either early or later on in life.        I spent some time answering Autumn Camacho's questions, and made no guarantees as to outcome. she desires to proceed. We will begin the pre-operative process per our usual protocol.        This will include:  1. Medical clearance from PCP     2. Anesthesia PSC Visit: Yes     3. Autologous Blood: No     4.  Implants and Special Equipment: Zimmer THA      5. DVT  and PE prophylaxis:  Aspirin 81 mg every 12 hours for 6 weeks starting POD #1.  SCD's and TED hose to be applied upon arrival to hospital.  PEPPER trial may change the plan.     6. Routine labs      Rondall Allegra, MD  [facility]  Hip and Knee Arthritis  Professor  Department of Orthopaedics and [place] [facility] [place], [place] acted as Education administrator for Dr. Catalina Gravel in documenting the service or procedure. To the best of my knowledge, I recorded what was dictated by Dr. Catalina Gravel.     Signed: Selam Pietsch.Marland Kitchen

## 2020-07-12 NOTE — Patient Instructions (Signed)
Preop visit main points    Surgery Type: Right total hip replacement on 07/30/2020 with Dr. Rondall Allegra    - COVID test appointment: 07/28/2020 at the [facility] garage at [place] to stop taking before surgery: 7 days: Multivitamin 3 days: NSAIDs such as Celebrex, Meloxicam and OTC Ibuprofen or Aleve    *if still taking Rinvoq, then hold medication 1 week before surgery and 1 week after surgery    - Pre Anesthesia Phone call: 07/23/2020 at 9:30 AM They will notify you of your surgery start time, when to check in, and medications to hold the night before and morning of surgery. You may call their department if you have any questions: [phone]    - Take a daily shower using a small amount of antiseptic soap for 6 days (last shower will be the morning of surgery). Start date: 07/25/2020    - Physical Therapy referral given today: First appointment should be 4-7 days after surgery.    - Pain Management: 2 business day refill policy. Please call clinic to request refills. If approved, prescriptions can be electronically sent to your preferred pharmacy.   - Elevate and ice surgical leg frequently to help with swelling post-operatively. Toes need to be above the level of your heart. Ice pack to incision site as frequently as 20 minutes on/20 minutes off.    - Blood Clot Prevention: You will be prescribed a blood thinner at discharge, take as instructed to prevent risk of blood clot after surgery.   - Wear compression socks at least until your first post op appointment, or longer as directed by your surgeon, to help with swelling post-operatively.     - Infection Prevention: Keep incision clean to prevent infection. You will receive information at the hospital about how to care for your incision and when you can shower.     - Constipation prevention!    - Prophylactic antibiotic protocol after surgery.     - Call or send a message via MyChart with any questions or concerns. If you have any urgent needs and need a  call back the same day, please do not leave a voicemail message on my direct line. Call the clinic to speak to someone directly. Please reference your medical record number when leaving me a voicemail message: [id number].

## 2020-07-13 LAB — R/O MRSA

## 2020-07-17 ENCOUNTER — Telehealth (HOSPITAL_COMMUNITY): Payer: Self-pay

## 2020-07-17 NOTE — Telephone Encounter (Signed)
Current Outpatient Medications:   .  acetaminophen 500 MG tablet, Take 1,000 mg by mouth 2 times a day., Disp: , Rfl:   .  gabapentin 300 MG capsule, Take 300 mg in the morning and 600 mg at bedtime (Patient taking differently: Take 300 mg by mouth 2 times a day. ), Disp: 90 capsule, Rfl: 2  .  meloxicam 7.5 MG tablet, Take 7.5 mg by mouth every morning. , Disp: , Rfl:   .  upadacitinib ER (Rinvoq) 15 MG 24 hour tablet, Take 1 tablet (15 mg) by mouth daily. (Patient taking differently: Take 15 mg by mouth every morning. ), Disp: 90 tablet, Rfl: 1  .  valACYclovir 1 g tablet, TAKE 1 TABLET(1000 MG) BY MOUTH DAILY (Patient taking differently: Take 1,000 mg by mouth every morning. ), Disp: 30 tablet, Rfl: 5    Medlist reviewed by RX via patient and records from [place] [facility] [phone].

## 2020-07-19 ENCOUNTER — Other Ambulatory Visit (HOSPITAL_BASED_OUTPATIENT_CLINIC_OR_DEPARTMENT_OTHER): Payer: Self-pay | Admitting: Rheumatology

## 2020-07-19 DIAGNOSIS — M069 Rheumatoid arthritis, unspecified: Secondary | ICD-10-CM

## 2020-07-19 HISTORY — PX: TOTAL KNEE REVISION: SHX996

## 2020-07-22 ENCOUNTER — Encounter (HOSPITAL_COMMUNITY): Payer: Self-pay

## 2020-07-22 MED ORDER — MELOXICAM 7.5 MG OR TABS
ORAL_TABLET | ORAL | 5 refills | Status: DC
Start: 2020-07-22 — End: 2020-07-31

## 2020-07-23 ENCOUNTER — Other Ambulatory Visit: Payer: Self-pay

## 2020-07-23 ENCOUNTER — Encounter (HOSPITAL_COMMUNITY): Payer: Self-pay

## 2020-07-23 ENCOUNTER — Ambulatory Visit: Payer: Medicare Other

## 2020-07-23 NOTE — Progress Notes (Signed)
Ortho Pre-Admission Data   Surgeon Name: Manner  Surgical Procedure: RIGHT THA  Surgery Date:  07/30/20    Information Source   Chart Review   Patient     Prior Functional / Medical Information   Medical History   6/21 Revision left TKA  Right total knee arthroplasty, July 2018  Left total knee arthroplasty, May 2018  Left revision total knee arthroplasty, June 2019  RA  Lumbar back pain  01/16/20 dislocation left knee requiring treatment the the ED    Previous Functional Level   Uses a cane. Independent DLs.    DME needs:  Already owns a walker and will bring it to the hospital.    Current Decision Maker   Patient     Cognitive Status   Alert   Oriented x4     Pre-Admission Living Situation  Lives with her husband, Mali Hayton [phone]    Architectural Considerations / Barriers   House: 3 steps in/ will stay on the first floor    Caregiver Support available for 1 week at D/C?   husband, Mali Hayton Marvell  Discharge Address   Will return to [facility] - therapist: Meredith Mody. Patient is a current client. PT date after surgery 10/19.    Transportation to / from [facility]   husband, Mali Hayton [phone]

## 2020-07-23 NOTE — Preprocedure Instructions (Addendum)
Pre-Surgery Instructions:   Medication Instructions   . acetaminophen 500 MG tablet Take on day of surgery w/ sips of water only .   . gabapentin 300 MG capsule Take  on day of surgery w/ sips of water only .   . meloxicam 7.5 MG tablet Follow Dr. Derek Mound instructions    . upadacitinib ER (Rinvoq) 15 MG 24 hour tablet Follow prescribing doctor's instructions .   . valACYclovir 1 g tablet take on day of surgery w/ sips of water only .   Arrival time : 0930 am for October 12 surgery .Marland Kitchen  Check in :  [facility].  Address : [address] [place] ,[place] [address](Surgical Services is located to the [place] side of the hospital building , in front of parking lot F)  Nothing to eat  from midnight ,but can have a cup of any  clear liquids which are water ,apple juice,black coffee no sugar no creamer no milk or plain tea no sugar no creamer no milk before 0700 am  on your surgery day (    October 12   )  .After  0700 am no more liquids not even chewing gum,no hard candy ,no tic -tac & no cough drops.  Shower with antibacterial soap like Hibiclens, Dial , Safeguard or Lever 2000 .Shower the night before surgery & the morning of .After your surgery morning scrubbing shower do not place any products to your hair no products to your skin ,  no lotion,cream, hair products,no deodorant ,fragrance.  Remove all jewelry including body piercing of any kind.  Bring photo ID & insurance card.  Any questions/concerns re:Pre-Anesthesia  call [phone]  open Mon-Fri 9-5 pm .  If running late on day of surgery call [phone].

## 2020-07-24 NOTE — Anesthesia Preprocedure Evaluation (Addendum)
Patient: Autumn Camacho    Procedure Information     Date/Time: 07/30/20 0920    Procedure: ARTHROPLASTY, RIGHT HIP, TOTAL (Right Hip) - No further clearance needed    Location: [place] [facility] OR 06 / [place] [facility] OR    Surgeons: Amparo Bristol, MD        HPI: pt with rheumatoid arthritis followed by rheumatology on rinvoq.  S/p L TKA and revision. SAB with ropi 0.5% 3cc and no apparent problems.     Relevant Problems   No relevant active problems       Relevant surgical history:       Medications:     Outpatient:   Current Outpatient Medications   Medication Instructions   . acetaminophen (TYLENOL) 1,000 mg, Oral, 2 times daily   . gabapentin 300 MG capsule Take 300 mg in the morning and 600 mg at bedtime   . meloxicam 7.5 MG tablet TAKE 1 TABLET(7.5 MG) BY MOUTH DAILY AS NEEDED FOR ARTHRITIS   . Rinvoq 15 mg, Oral, Daily   . valACYclovir 1 g tablet TAKE 1 TABLET(1000 MG) BY MOUTH DAILY                Review of patient's allergies indicates:  Allergies   Allergen Reactions   . Fentanyl Other     Per patient, she has "forgotten to breathe"   . Hydromorphone XI:[id number]/[id number]   . Remicade [Infliximab] Anaphylaxis   . Codeine Other     "Feels like her skin is crawling"   . Nortriptyline Other     Heart palpitations       Social History:     Medical History and Review of Systems    Source of information: In person visit and Chart review.       Physical Exam  Airway  Mallampati:  II  Neck ROM:  Full  Mouth Opening:  Normal    Dental  normal      Cardiovascular  normal      Pulmonary  normal             Labs: (last year)    BMP  CBC/Coags   Na 140 06/27/2020  Hb 13.8 06/27/2020   K 4.0 06/27/2020  HCT 44 06/27/2020   Cl 108 06/27/2020  WBC 4.89 06/27/2020   HCO3 23 06/27/2020  PLT 200 06/27/2020   BUN 11 06/27/2020  INR - -   Cr 0.57 06/27/2020  PT - -   Glu 78 06/27/2020  PTT - -       Misc   eGFR >60 06/27/2020  MCV 98 06/27/2020   A1C - -  BNP - -       LFTs   AST 16 06/27/2020  Albumin 4.0 06/27/2020   ALT 11 06/27/2020  Protein 7.0  06/27/2020   Alk Phos 77 06/27/2020  T Bili 0.2 06/27/2020         Relevant procedures / diagnostic studies:     Anesthesia Plan    PAT Discussion  PAT Anesthesia Plan: regional    Supervising Provider - Day of Procedure  ASA 2     Planned Anesthetic Type: regional    Anesthetic plan and risks discussed with patient.          Risk Calculators / Scores:

## 2020-07-25 ENCOUNTER — Other Ambulatory Visit (INDEPENDENT_AMBULATORY_CARE_PROVIDER_SITE_OTHER): Payer: Self-pay | Admitting: Orthopaedic Surgery

## 2020-07-25 DIAGNOSIS — Z01812 Encounter for preprocedural laboratory examination: Secondary | ICD-10-CM

## 2020-07-25 DIAGNOSIS — Z20828 Contact with and (suspected) exposure to other viral communicable diseases: Secondary | ICD-10-CM

## 2020-07-26 NOTE — Telephone Encounter (Signed)
Insurance:  Insurance Company:  [employer] Telephone Number:  [phone]  Group Number:     Subscriber ID:  [id number]  Insurance Rep:  Ernst Breach     Pre Authorization Needed:  yes  PA Number:  [id number]  Authorization Status:  Approved  Reference #:  Same as PA #  Auth Received Date/Time:  07/26/20 @ 9:00AM  Auth Received Via: Phone call [phone]

## 2020-07-28 ENCOUNTER — Ambulatory Visit: Payer: Medicare Other | Attending: Orthopaedic Surgery

## 2020-07-28 DIAGNOSIS — Z20822 Contact with and (suspected) exposure to covid-19: Secondary | ICD-10-CM

## 2020-07-28 DIAGNOSIS — Z01812 Encounter for preprocedural laboratory examination: Secondary | ICD-10-CM | POA: Insufficient documentation

## 2020-07-28 LAB — COVID-19 CORONAVIRUS QUALITATIVE PCR: COVID-19 Coronavirus Qual PCR Result: NOT DETECTED

## 2020-07-28 NOTE — Progress Notes (Signed)
Patient was seen on 07/28/2020 at the [facility] COVID 19 TEST SITE drive up site where a sample of Anterior Nares collection was taken. The specimen was sent to the [facility] lab for COVID-19 testing.  Patient will be informed of test results within 48 hours.  Patient received informational instructions on self-care.    The specimen was collected by: Adam Alcaraz, RN

## 2020-07-28 NOTE — Patient Instructions (Signed)
Evaluation for COVID-19  Testing, Result Information, Symptom Management    Who is being tested for COVID-19?  [facility] is testing patients for COVID-19 for:  1. Patients who have symptoms that may be related to COVID-19  2. Patients who were exposed to COVID-19  3. Patients who require testing for travel or to return to work  4. Patients who do not have symptoms, but have an upcoming surgery or procedure that requires routine COVID-19 testing beforehand    If a COVID-19 test was ordered, what number do I call to set up a swabbing appointment?  You may call the [facility] COVID-19 Line at [phone].    If you are experiencing symptoms, what do we believe you have?  You have a viral syndrome, which may include symptoms like muscle aches, fevers, chills, runny nose, cough, sneezing, sore throat, vomiting or diarrhea.     SARS-CoV-2, the virus that causes COVID-19, is one of the potential viruses you may have. You may be just as likely to have a different viral infection such as the common cold or flu.    Most patients with COVID-19 have mild symptoms and recover on their own. Resting, staying hydrated, and sleeping are typically helpful. As of today's visit, you are well enough to go home and treat your symptoms with oral fluids and medicines for fevers, cough, pain, etc. If your symptoms worsen, you should seek additional medical care.    Why is COVID-19 testing being performed before my surgery/procedure?  The safety of our patients and staff is our top priority. We are performing COVID-19 testing before certain surgeries and procedures to maintain everyone's safety and help prevent others from getting infected or exposed.    When will I receive results for my COVID-19 test?  If COVID-19 testing is performed, the results should be available in 1-2 days. You may find testing follow-up instructions here: [email or web address]    Who can I contact for questions?  Call  [phone] for any COVID-19 questions or if your symptoms are worsening. Please allow 48 hours for results to finalize before contacting us about your result status.    How do I receive results?  Please do not contact the Emergency Department or clinic for results of this test. Please wait to be contacted as outlined below and do not go to your doctor's office for results.    IF THE RESULT IS POSITIVE OR INCONCLUSIVE  A member of the [facility] team will call you for further discussion. You may also view your result in eCare or through a QR code you may receive at your testing site.    IF THE RESULT IS NEGATIVE  You will receive this information by phone, via eCare, or a QR code you may receive at your testing site.    Pre-surgical evaluations  A member of your surgery team will review your results and contact you if needed. Please remain isolated until your surgery date to reduce the risk of COVID-19 exposure.    eCare  If you are a [facility] patient, eCare ([email or web address]) is the fastest way to receive your results. Results will be released to eCare within one hour of being posted in our system, and you may receive your results before we are able to contact you.    QR Code  You may receive a QR code label at the time of your test. If you do not have an eCare account, you may use the QR code label to view   your results at [email or web address]. You will not receive a notification when your result is ready to view on this site, but you can visit the site as many times as you wish to check the result status.    What do I do while I wait for my test results?   Stay home except to get medical care. Do not return to work or your regular activities outside of home. Remain isolated until you receive your results.    After receiving your results, follow the instructions here: [email or web address]    Please follow the precautions below:   Stay home except to  get medical care.     Do not go to work, school, or public areas. Avoid using public transportation, ride-sharing, or taxis.   Separate yourself from other people in your home as much as possible.   Stay in a specific room and away from other people in your home as much as possible. Use a separate bathroom if possible.   Do not share household items with other people in your home.   This includes sharing dishes, drinking glasses, cups, eating utensils, towels or bedding. After using these items, they should be washed thoroughly with soap and water.   Clean all "high-touch" surfaces regularly.   This includes counters, tabletops, doorknobs, bathroom fixtures, toilets, phones, keyboards, tablets and bedside tables. Also, clean any surfaces that may have blood, stool or body fluids on them. Use a household cleaning spray or wipe, according to the label instructions.   Cover your coughs and sneezes with a tissue, mask or the inside of your elbow.   Throw used tissues in a lined trash can; immediately wash your hands with soap and water for at least 20 seconds or clean your hands with an alcohol-based hand sanitizer that contains at least 60% alcohol. Soap and water should be used if hands are visibly dirty.   When seeking care at a healthcare facility:   Seek prompt medical attention if your illness is worsening (e.g., difficulty breathing).   When possible, call the healthcare provider before arriving.   Put on a facemask before you enter the facility.   If possible, put on a facemask before the ambulance or paramedics arrive.   These steps will help the healthcare provider's office prevent other people from getting infected or exposed.      Please see the resources below for more information  Information Lines  [place] [facility] of Health COVID-19 Call Center: 1-800-525-0127   [facility] COVID-19 Line: [phone]    [facility] Websites  COVID-19  Information  [email or web address]    [place] Department of Health Websites  General Facts on COVID-19  doh.[place].gov/Emergencies/NovelCoronavirusOutbreak2020/FactSheet    What to do if you were potentially exposed to someone with confirmed coronavirus disease (COVID-19)  doh.wa.gov/Portals/1/Documents/1600/coronavirus/COVIDexposed.pdf    Centers for Disease Control and Prevention (CDC) Websites  COVID-19 FAQs:  cdc.gov/coronavirus/2019-ncov/faq.html    What to do if you are sick: cdc.gov/coronavirus/2019-ncov/if-you-are-sick/steps-when-sick.html

## 2020-07-30 ENCOUNTER — Encounter (HOSPITAL_COMMUNITY): Payer: Self-pay | Admitting: Certified Registered"

## 2020-07-30 ENCOUNTER — Encounter (HOSPITAL_COMMUNITY): Payer: Self-pay | Admitting: Orthopaedic Surgery

## 2020-07-30 ENCOUNTER — Ambulatory Visit (HOSPITAL_BASED_OUTPATIENT_CLINIC_OR_DEPARTMENT_OTHER): Payer: No Typology Code available for payment source

## 2020-07-30 ENCOUNTER — Ambulatory Visit (HOSPITAL_COMMUNITY): Payer: Medicare Other | Admitting: Orthopaedic Surgery

## 2020-07-30 ENCOUNTER — Other Ambulatory Visit: Payer: Self-pay

## 2020-07-30 ENCOUNTER — Ambulatory Visit
Admission: RE | Admit: 2020-07-30 | Discharge: 2020-07-31 | Disposition: A | Discharge status: Home / Self Care | Payer: No Typology Code available for payment source | Attending: Orthopaedic Surgery | Admitting: Orthopaedic Surgery

## 2020-07-30 ENCOUNTER — Ambulatory Visit (HOSPITAL_COMMUNITY): Payer: No Typology Code available for payment source

## 2020-07-30 ENCOUNTER — Encounter (HOSPITAL_COMMUNITY)
Admission: RE | Disposition: A | Discharge status: Home / Self Care | Payer: Self-pay | Source: Home / Self Care | Attending: Orthopaedic Surgery

## 2020-07-30 ENCOUNTER — Ambulatory Visit (HOSPITAL_BASED_OUTPATIENT_CLINIC_OR_DEPARTMENT_OTHER): Payer: No Typology Code available for payment source | Admitting: Certified Registered"

## 2020-07-30 DIAGNOSIS — M1611 Unilateral primary osteoarthritis, right hip: Secondary | ICD-10-CM

## 2020-07-30 DIAGNOSIS — G8929 Other chronic pain: Secondary | ICD-10-CM

## 2020-07-30 DIAGNOSIS — Z96652 Presence of left artificial knee joint: Secondary | ICD-10-CM

## 2020-07-30 DIAGNOSIS — M25551 Pain in right hip: Secondary | ICD-10-CM

## 2020-07-30 DIAGNOSIS — M069 Rheumatoid arthritis, unspecified: Secondary | ICD-10-CM | POA: Insufficient documentation

## 2020-07-30 DIAGNOSIS — Z87891 Personal history of nicotine dependence: Secondary | ICD-10-CM | POA: Insufficient documentation

## 2020-07-30 HISTORY — PX: HIP ARTHROPLASTY: SHX981

## 2020-07-30 LAB — GLUCOSE, FINGERSTICK POC: Glucose, Finger Stick POC: 78 mg/dL (ref 62–125)

## 2020-07-30 SURGERY — ARTHROPLASTY, HIP, TOTAL
Anesthesia: Regional | Site: Hip | Laterality: Right | Wound class: Class I/ Clean

## 2020-07-30 MED ORDER — LACTATED RINGERS IV SOLN
100.0000 mL/h | INTRAVENOUS | Status: DC
Start: 2020-07-30 — End: 2020-07-31
  Administered 2020-07-30: 100 mL/h via INTRAVENOUS

## 2020-07-30 MED ORDER — BUPIVACAINE IN DEXTROSE 0.75-8.25 % IT SOLN
INTRATHECAL | Status: DC | PRN
Start: 2020-07-30 — End: 2020-07-30
  Administered 2020-07-30: 2 mL via INTRATHECAL

## 2020-07-30 MED ORDER — MIDAZOLAM HCL (PF) 2 MG/2ML IJ SOLN
INTRAMUSCULAR | Status: AC
Start: 2020-07-30 — End: ?
  Filled 2020-07-30: qty 2

## 2020-07-30 MED ORDER — BISACODYL 5 MG OR TBEC
10.0000 mg | DELAYED_RELEASE_TABLET | Freq: Every day | ORAL | Status: DC | PRN
Start: 2020-07-30 — End: 2020-07-31

## 2020-07-30 MED ORDER — POTASSIUM CHLORIDE IN NACL 20-0.9 MEQ/L-% IV SOLN
100.0000 mL/h | INTRAVENOUS | Status: DC
Start: 2020-07-30 — End: 2020-07-31
  Administered 2020-07-31: 100 mL/h via INTRAVENOUS

## 2020-07-30 MED ORDER — MORPHINE SULFATE (PF) 2 MG/ML IV/IJ SOLN WRAPPER
1.0000 mg | Status: DC | PRN
Start: 2020-07-30 — End: 2020-07-31
  Administered 2020-07-30: 2 mg via INTRAVENOUS
  Filled 2020-07-30: qty 1

## 2020-07-30 MED ORDER — TRAMADOL HCL 50 MG OR TABS
50.0000 mg | ORAL_TABLET | Freq: Four times a day (QID) | ORAL | Status: DC | PRN
Start: 2020-07-30 — End: 2020-07-31
  Administered 2020-07-30: 50 mg via ORAL
  Filled 2020-07-30: qty 1

## 2020-07-30 MED ORDER — OXYCODONE HCL ER 10 MG OR T12A
10.0000 mg | EXTENDED_RELEASE_TABLET | ORAL | Status: AC
Start: 2020-07-30 — End: 2020-07-30
  Administered 2020-07-30: 10 mg via ORAL
  Filled 2020-07-30: qty 1

## 2020-07-30 MED ORDER — ONDANSETRON HCL 4 MG/2ML IJ SOLN
4.0000 mg | Freq: Three times a day (TID) | INTRAMUSCULAR | Status: DC | PRN
Start: 2020-07-30 — End: 2020-07-31
  Administered 2020-07-31: 4 mg via INTRAVENOUS
  Filled 2020-07-30: qty 2

## 2020-07-30 MED ORDER — GABAPENTIN 300 MG OR CAPS
300.0000 mg | ORAL_CAPSULE | Freq: Two times a day (BID) | ORAL | Status: DC
Start: 2020-07-30 — End: 2020-07-31
  Administered 2020-07-30 – 2020-07-31 (×2): 300 mg via ORAL
  Filled 2020-07-30 (×2): qty 1

## 2020-07-30 MED ORDER — CALCIUM CARBONATE ANTACID 500 MG OR CHEW
1000.0000 mg | CHEWABLE_TABLET | Freq: Three times a day (TID) | ORAL | Status: DC | PRN
Start: 2020-07-30 — End: 2020-07-31

## 2020-07-30 MED ORDER — LIDOCAINE HCL 1 % IJ SOLN
1.0000 mL | Freq: Once | INTRAMUSCULAR | Status: AC
Start: 2020-07-30 — End: 2020-07-30
  Administered 2020-07-30: 1 mL via INTRADERMAL

## 2020-07-30 MED ORDER — ALBUTEROL SULFATE (2.5 MG/3ML) 0.083% IN NEBU
2.5000 mg | INHALATION_SOLUTION | Freq: Once | RESPIRATORY_TRACT | Status: DC
Start: 2020-07-30 — End: 2020-07-30

## 2020-07-30 MED ORDER — TRANEXAMIC ACID-NACL 1000-0.7 MG/100ML-% IV SOLN
1000.0000 mg | Freq: Once | INTRAVENOUS | Status: AC
Start: 2020-07-30 — End: 2020-07-30
  Administered 2020-07-30: 1000 mg via INTRAVENOUS
  Filled 2020-07-30: qty 100

## 2020-07-30 MED ORDER — KETOROLAC TROMETHAMINE 15 MG/ML IJ SOLN
15.0000 mg | Freq: Four times a day (QID) | INTRAMUSCULAR | Status: DC
Start: 2020-07-30 — End: 2020-07-31
  Administered 2020-07-30 – 2020-07-31 (×4): 15 mg via INTRAVENOUS
  Filled 2020-07-30 (×4): qty 1

## 2020-07-30 MED ORDER — MORPHINE SULFATE (PF) 10 MG/ML IV/IJ SOLN WRAPPER
2.0000 mg | Status: DC | PRN
Start: 2020-07-30 — End: 2020-07-30

## 2020-07-30 MED ORDER — FENTANYL CITRATE (PF) 100 MCG/2ML IJ SOLN
INTRAMUSCULAR | Status: AC
Start: 2020-07-30 — End: ?
  Filled 2020-07-30: qty 2

## 2020-07-30 MED ORDER — LACTATED RINGERS IV SOLN
50.0000 mL/h | INTRAVENOUS | Status: DC
Start: 2020-07-30 — End: 2020-07-31
  Administered 2020-07-30: 50 mL/h via INTRAVENOUS

## 2020-07-30 MED ORDER — MIDAZOLAM HCL (PF) 1 MG/ML IJ SOLN WRAPPER (ANESTHESIA OSM ONLY)
INTRAMUSCULAR | Status: DC | PRN
Start: 2020-07-30 — End: 2020-07-30
  Administered 2020-07-30: 2 mg via INTRAVENOUS

## 2020-07-30 MED ORDER — LIDOCAINE HCL PF 2% IV/IJ SOSY/SOLN WRAPPER (ANESTHESIA OSM ONLY)
INTRAVENOUS | Status: DC | PRN
Start: 2020-07-30 — End: 2020-07-30
  Administered 2020-07-30: 100 mg via INTRAVENOUS

## 2020-07-30 MED ORDER — ONDANSETRON HCL 4 MG OR TABS
4.0000 mg | ORAL_TABLET | Freq: Three times a day (TID) | ORAL | Status: DC | PRN
Start: 2020-07-30 — End: 2020-07-31
  Administered 2020-07-30: 4 mg via ORAL
  Filled 2020-07-30: qty 1

## 2020-07-30 MED ORDER — OXYCODONE HCL 5 MG OR TABS
5.0000 mg | ORAL_TABLET | ORAL | Status: DC | PRN
Start: 2020-07-30 — End: 2020-07-30

## 2020-07-30 MED ORDER — PHENYLEPHRINE HCL-NACL 1-0.9 MG/10ML-% IV SOSY
PREFILLED_SYRINGE | INTRAVENOUS | Status: DC | PRN
Start: 2020-07-30 — End: 2020-07-30
  Administered 2020-07-30 (×2): 50 ug via INTRAVENOUS
  Administered 2020-07-30: 100 ug via INTRAVENOUS

## 2020-07-30 MED ORDER — ONDANSETRON HCL 4 MG/2ML IJ SOLN
4.0000 mg | INTRAMUSCULAR | Status: DC | PRN
Start: 2020-07-30 — End: 2020-07-30

## 2020-07-30 MED ORDER — EPHEDRINE SULFATE (PRESSORS) 50 MG/ML IV SOLN
INTRAVENOUS | Status: AC
Start: 2020-07-30 — End: ?
  Filled 2020-07-30: qty 1

## 2020-07-30 MED ORDER — MEPERIDINE HCL 25 MG/ML IJ SOLN
10.0000 mg | INTRAMUSCULAR | Status: DC | PRN
Start: 2020-07-30 — End: 2020-07-30

## 2020-07-30 MED ORDER — CEFAZOLIN SODIUM-DEXTROSE 2-4 GM/100ML-% IV SOLN
2.0000 g | Freq: Three times a day (TID) | INTRAVENOUS | Status: AC
Start: 2020-07-30 — End: 2020-07-30
  Administered 2020-07-30 (×2): 2 g via INTRAVENOUS
  Filled 2020-07-30 (×2): qty 100

## 2020-07-30 MED ORDER — SODIUM CHLORIDE 0.9 % IR SOLN
Status: DC | PRN
Start: 2020-07-30 — End: 2020-07-30
  Administered 2020-07-30: 3000 mL
  Administered 2020-07-30: 1000 mL

## 2020-07-30 MED ORDER — PROPOFOL 1000 MG/100ML IV EMUL
INTRAVENOUS | Status: AC
Start: 2020-07-30 — End: ?
  Filled 2020-07-30: qty 100

## 2020-07-30 MED ORDER — METOCLOPRAMIDE HCL 5 MG/ML IJ SOLN
5.0000 mg | Freq: Once | INTRAMUSCULAR | Status: DC | PRN
Start: 2020-07-30 — End: 2020-07-30

## 2020-07-30 MED ORDER — CELECOXIB 200 MG OR CAPS
400.0000 mg | ORAL_CAPSULE | ORAL | Status: AC
Start: 2020-07-30 — End: 2020-07-30
  Administered 2020-07-30: 400 mg via ORAL
  Filled 2020-07-30: qty 2

## 2020-07-30 MED ORDER — SENNOSIDES 8.6 MG OR TABS
17.2000 mg | ORAL_TABLET | Freq: Two times a day (BID) | ORAL | Status: DC | PRN
Start: 2020-07-30 — End: 2020-07-31
  Administered 2020-07-30: 17.2 mg via ORAL
  Filled 2020-07-30: qty 2

## 2020-07-30 MED ORDER — NALOXONE HCL 0.4 MG/ML IJ SOLN
0.0400 mg | INTRAMUSCULAR | Status: DC | PRN
Start: 2020-07-30 — End: 2020-07-30

## 2020-07-30 MED ORDER — POLYETHYLENE GLYCOL 3350 17 G OR PACK
17.0000 g | PACK | Freq: Every day | ORAL | Status: DC
Start: 2020-07-31 — End: 2020-07-31
  Administered 2020-07-31: 17 g via ORAL
  Filled 2020-07-30: qty 1

## 2020-07-30 MED ORDER — BISACODYL 10 MG RE SUPP
10.0000 mg | Freq: Every day | RECTAL | Status: DC | PRN
Start: 2020-07-30 — End: 2020-07-31

## 2020-07-30 MED ORDER — MELATONIN 3 MG OR TABS
3.0000 mg | ORAL_TABLET | Freq: Every evening | ORAL | Status: DC | PRN
Start: 2020-07-30 — End: 2020-07-31
  Filled 2020-07-30: qty 1

## 2020-07-30 MED ORDER — SYRINGE (ANESTHESIA OSM ONLY)
INTRAVENOUS | Status: DC | PRN
Start: 2020-07-30 — End: 2020-07-30
  Administered 2020-07-30: 125 ug/kg/min via INTRAVENOUS

## 2020-07-30 MED ORDER — ACETAMINOPHEN 500 MG OR TABS
1000.0000 mg | ORAL_TABLET | Freq: Three times a day (TID) | ORAL | Status: DC
Start: 2020-07-30 — End: 2020-07-31
  Administered 2020-07-30 – 2020-07-31 (×3): 1000 mg via ORAL
  Filled 2020-07-30 (×3): qty 2

## 2020-07-30 MED ORDER — PROPOFOL 10 MG/ML IV EMUL WRAPPER (OSM ONLY)
INTRAVENOUS | Status: DC | PRN
Start: 2020-07-30 — End: 2020-07-30
  Administered 2020-07-30: 60 mg via INTRAVENOUS

## 2020-07-30 MED ORDER — ACETAMINOPHEN 500 MG OR TABS
1000.0000 mg | ORAL_TABLET | ORAL | Status: DC
Start: 2020-07-30 — End: 2020-07-30
  Filled 2020-07-30: qty 2

## 2020-07-30 MED ORDER — LIDOCAINE HCL (PF) 2 % IJ SOLN
INTRAMUSCULAR | Status: AC
Start: 2020-07-30 — End: ?
  Filled 2020-07-30: qty 5

## 2020-07-30 MED ORDER — BUPIVACAINE-EPINEPHRINE (PF) 0.25% -1:200000 IJ SOLN
INTRAMUSCULAR | Status: DC | PRN
Start: 2020-07-30 — End: 2020-07-30
  Administered 2020-07-30: 30 mL via INTRAMUSCULAR

## 2020-07-30 MED ORDER — MAGNESIUM CITRATE 1.745 GM/30ML OR SOLN
150.0000 mL | Freq: Every day | ORAL | Status: DC | PRN
Start: 2020-07-30 — End: 2020-07-31

## 2020-07-30 MED ORDER — EPINEPHRINE PF 1 MG/ML IJ SOLN
INTRAMUSCULAR | Status: AC
Start: 2020-07-30 — End: ?
  Filled 2020-07-30: qty 1

## 2020-07-30 MED ORDER — MORPHINE SULFATE (PF) 4 MG/ML IV/IJ SOLN WRAPPER
Status: AC
Start: 2020-07-30 — End: 2020-07-30
  Administered 2020-07-30: 4 mg
  Filled 2020-07-30: qty 1

## 2020-07-30 MED ORDER — APREPITANT 40 MG OR CAPS
40.0000 mg | ORAL_CAPSULE | ORAL | Status: AC
Start: 2020-07-30 — End: 2020-07-30
  Administered 2020-07-30: 40 mg via ORAL
  Filled 2020-07-30: qty 1

## 2020-07-30 MED ORDER — CEFAZOLIN SODIUM-DEXTROSE 2-4 GM/100ML-% IV SOLN
2.0000 g | INTRAVENOUS | Status: AC
Start: 2020-07-30 — End: 2020-07-30
  Administered 2020-07-30: 2 g via INTRAVENOUS
  Filled 2020-07-30: qty 100

## 2020-07-30 MED ORDER — ALUM & MAG HYDROXIDE-SIMETH 200-200-20 MG/5ML OR SUSP
30.0000 mL | Freq: Four times a day (QID) | ORAL | Status: DC | PRN
Start: 2020-07-30 — End: 2020-07-31

## 2020-07-30 MED ORDER — OXYCODONE HCL 5 MG OR TABS
5.0000 mg | ORAL_TABLET | ORAL | Status: DC | PRN
Start: 2020-07-30 — End: 2020-07-31
  Administered 2020-07-30 (×3): 10 mg via ORAL
  Administered 2020-07-31: 5 mg via ORAL
  Administered 2020-07-31 (×2): 10 mg via ORAL
  Filled 2020-07-30 (×3): qty 2
  Filled 2020-07-30: qty 1
  Filled 2020-07-30 (×2): qty 2

## 2020-07-30 MED ORDER — HYDRALAZINE HCL 20 MG/ML IJ SOLN
10.0000 mg | INTRAMUSCULAR | Status: DC | PRN
Start: 2020-07-30 — End: 2020-07-30

## 2020-07-30 MED ORDER — BUPIVACAINE-EPINEPHRINE 0.25% -1:200000 IJ SOLN
INTRAMUSCULAR | Status: AC
Start: 2020-07-30 — End: ?
  Filled 2020-07-30: qty 30

## 2020-07-30 MED ORDER — EPHEDRINE SULFATE (PRESSORS) 50 MG/ML IV SOLN
INTRAVENOUS | Status: DC | PRN
Start: 2020-07-30 — End: 2020-07-30
  Administered 2020-07-30 (×5): 10 mg via INTRAVENOUS

## 2020-07-30 MED ORDER — PHENYLEPHRINE HCL-NACL 25-0.9 MG/250ML-% IV SOLN
INTRAVENOUS | Status: DC | PRN
Start: 2020-07-30 — End: 2020-07-30
  Administered 2020-07-30: 0.2 ug/kg/min via INTRAVENOUS

## 2020-07-30 MED ORDER — GLYCOPYRROLATE 0.2 MG/ML IJ SOLN
0.2000 mg | INTRAMUSCULAR | Status: DC | PRN
Start: 2020-07-30 — End: 2020-07-30

## 2020-07-30 MED ORDER — LABETALOL HCL 5 MG/ML IV SOLN
5.0000 mg | INTRAVENOUS | Status: DC | PRN
Start: 2020-07-30 — End: 2020-07-30

## 2020-07-30 MED ORDER — EPINEPHRINE PF 1 MG/ML IJ SOLN
INTRAMUSCULAR | Status: DC | PRN
Start: 2020-07-30 — End: 2020-07-30
  Administered 2020-07-30: 1 mg

## 2020-07-30 MED ORDER — PREGABALIN 50 MG OR CAPS
50.0000 mg | ORAL_CAPSULE | ORAL | Status: AC
Start: 2020-07-30 — End: 2020-07-30
  Administered 2020-07-30: 50 mg via ORAL
  Filled 2020-07-30: qty 1

## 2020-07-30 MED ORDER — ASPIRIN 81 MG OR TBEC
81.0000 mg | DELAYED_RELEASE_TABLET | Freq: Two times a day (BID) | ORAL | Status: DC
Start: 2020-07-31 — End: 2020-07-31
  Administered 2020-07-31: 81 mg via ORAL
  Filled 2020-07-30: qty 1

## 2020-07-30 MED ORDER — LACTATED RINGERS BOLUS
500.0000 mL | Freq: Once | INTRAVENOUS | Status: DC | PRN
Start: 2020-07-30 — End: 2020-07-30

## 2020-07-30 SURGICAL SUPPLY — 40 items
ADHESIVE SKIN DERMABOND PRINEO 22CM (Closure Device) ×2 IMPLANT
APPLICATOR PREP CHLORAPREP 26ML HI-LITE ORANGE 2% CHG (Prep) ×4 IMPLANT
BANDAGE ADHESIVE PRIMAPORE 13 3/4INX4IN ISLAND (Dressing) ×2 IMPLANT
BANDAGE COFLEX LF2 5YDX6IN COHESIVE STERILE (Dressing) ×2 IMPLANT
BLADE SAGITTAL DE SOUTTER RECIPROCATING DRIVE 12 X 80 X 1.27MM (Blade) ×2 IMPLANT
BONE CEMENT PWR-FLO COMPACT VAC MIX (Other) ×2 IMPLANT
BRUSH FEMORAL CANAL SUCTION IRRIG STERILE LF (Other) ×2 IMPLANT
CATHETER TRAY SURESTEP 16FR URINE METER (Catheter) ×2 IMPLANT
CEMENT BONE PALACOS R 40 GM HIGH VISCOSITY ×4 IMPLANT
DRAPE 3/4 PROXIMA 77INX53IN SHEET (Drape) ×2 IMPLANT
DRAPE HIP ORTHOMAX 137INX112INX89IN POUCH ABSORB REINFORCE (Drape) ×2 IMPLANT
DRAPE STERI-DRAPE 23INX17IN ADHESIVE INCISE (Drape) ×2 IMPLANT
DRAPE STERI-DRAPE 23INX17IN ADHESIVE STRIP LG TOWEL (Drape) ×2 IMPLANT
DRESSING TRANSPARENT 4-3/4INX4IN TEGADERM HP (Dressing) ×2 IMPLANT
ELECTRODE 6IN BLADE HEX HUB (Cautery) ×2 IMPLANT
FEMORAL BONE PREP KIT 10EA/PK (Prep) ×2 IMPLANT
HEAD FEMORAL TRILOGY IT BIOLOX DELTA 32MM +7MM 12/14 (Arthroplasty) ×2 IMPLANT
HOOD SURGEON FLYTE STERI-SHIELD PEELAWAY FACE SHIELD (Gown) ×14 IMPLANT
KIT DRESSING EAR TELFA 4INX4IN (Dressing) ×2 IMPLANT
LINEN GOWN XL (Gown) ×12 IMPLANT
LINER 50MM 32MM STD LONGEVITY TRILOGY (Arthroplasty) ×2 IMPLANT
NEEDLE HYPODERMIC 18GA 1 1/2IN BD (Needle) ×2 IMPLANT
PACK CUST TOTAL HIP (Pack) ×2 IMPLANT
PACKING GAUZE VAGINAL 2IN X 36IN (Packing) ×4 IMPLANT
PAD ESURG GRNDING UNIV PREATTACH CORD SPLIT (Other) ×2 IMPLANT
SCREW ACETABULAR TRILOGY 6.5MM 40MM SELF TAP (Screw) ×1 IMPLANT
SCREW ACETABULAR TRILOGY 6.5MM 40MM SELF TAP 00625006540 (Screw) ×2 IMPLANT
SET INTERPULSE SUCTION TUBE HIGH FLOW TIP (Other) ×2 IMPLANT
SHELL ACETABULAR TRILOGY TIVANIUM 52MM PRIM (Arthroplasty) ×2 IMPLANT
SPONGE GAUZE 2INX2IN STERILE 8PLY (Sponge) ×2 IMPLANT
SPONGE GAUZE 4INX4IN STERILE 12PLY MEDLINE (Sponge) ×2 IMPLANT
STEM FEMORAL CPT 105MMX7.5MM 0-STD OFFSET (Arthroplasty) ×2 IMPLANT
STEM FEMORAL ZIMMER M/L TAPER 7.5MM 114MM REDUCED NECK STD OFFSET (Arthroplasty) ×2 IMPLANT
SUTURE MAXBRAID 2 HC-5 BLUE (Suture) ×2 IMPLANT
SUTURE STRATAFIX 30CM 2-0 CP-2 SPIRAL UNDYED (Suture) ×2 IMPLANT
SUTURE STRATAFIX 36CM 1 CTX VIOLET (Suture) ×4 IMPLANT
SUTURE STRATAFIX SPIRAL PDO 36CM 2-0 MH VIOLET (Suture) ×2 IMPLANT
SYRINGE 30ML LL (Syringe) ×2 IMPLANT
TOWEL SURGICAL STERILE DISP 17 X 27IN ×4 IMPLANT
TUBE SUCTION KAMVAC MINI (Tubing) ×2 IMPLANT

## 2020-07-30 NOTE — Anesthesia Procedure Notes (Signed)
Neuraxial Procedure    Block Type:  Procedure: spinal.  Level: L4-L5    Block Indication/Location:   Patient location during procedure: OR/Procedural area  Indication: primary anesthetic    Staff:  Performing provider: Patrcia Dolly, MD  Authorizing provider: Patrcia Dolly, MD    Consent:  Risks discussed: block failure or incomplete block, motor weakness, nerve injury, bleeding/hematoma, infection, local anesthetic toxicity, shortness of breath, headache and intrathecal injection  Obtained by: Patrcia Dolly, MD  Obtained from: patient    Neuraxial Checklist:  Patient identifed with two distinct identifiers, Procedural consent reviewed, Block consent confirmed, Allergies reviewed, Anticoagulation status reviewed, Procedural consent reviewed, Maximum local anesthetic dose calculated, Emergency drugs available, Medication syringes labeled and Monitoring in place.    Preparation:  Position: sitting  Patient sedation: mild sedation  Monitors: SpO2, ECG and NIBP  Skin Prep: chlorhexidine  Sterile Barriers: sterile gloves  Skin local anesthetic (LA) used: injected    Ultrasound guided: No    Spinal needle:  Introducer used: yes  Approach: midline  Guidance: landmark  Paresthesia: no  Aspiration: CSF  Heme: No    Date and time placed:   07/30/2020 9:28 AM    Assessment:   Pain relief: patient not in pain prior to block  Complications: none

## 2020-07-30 NOTE — Anesthesia Postprocedure Evaluation (Signed)
Patient: Autumn Camacho    Procedure Summary     Date: 07/30/20 Room / Location: [place] [facility] / [place]    Anesthesia Start: 0910 Anesthesia Stop: 4481    Procedure: ARTHROPLASTY, RIGHT HIP, TOTAL (Right Hip) Diagnosis:       Status post revision of total replacement of left knee      (Status post revision of total replacement of left knee [E56.314])    Surgeons: Amparo Bristol, MD Responsible Provider: Patrcia Dolly, MD    Anesthesia Type: regional ASA Status: 2        Final Anesthesia Type: regional      Place of evaluation: PACU    Patient participation: patient participated    Level of consciousness: fully conscious    Patient pain control satisfaction: patient is satisfied with level of pain control    Airway patency: patent    Cardiovascular status during assessment: stable    Respiratory status during assessment: breathing comfortably    Anesthetic complications: no    Intravascular volume status assessment: euvolemic    Nausea / vomiting: patient is not experiencing nausea

## 2020-07-30 NOTE — Discharge Instr - Activity (Signed)
Physical Therapy Discharge Mobility Recommendations    -Recommended Assistive Device: Walker for all walking activity at this time.  -Weight bearing Status: Weightbear as tolerated on your right leg.  -Stairs: Go up steps leading with the good left leg. Go down steps leading with the operated right leg. Hold onto your rail and cane for support. Have supervision for safety.  -Precautions: Avoid actively swinging your right leg out to the side, use your leg lifter to move the leg out to the side. Avoid bending forward more that 120 degrees at the hip.  -Exercise Recommendations: Complete home exercise program, as written on your [employer] home exercise program, 3 times daily.  -Ice Recommendations: Ice your right hip for 20-30 minutes after exercise or vigorous activity to reduce pain and swelling.

## 2020-07-30 NOTE — Addendum Note (Signed)
Addendum  created 07/30/20 1137 by Patrcia Dolly, MD    Order list changed

## 2020-07-30 NOTE — Progress Notes (Addendum)
Medicare Certification  Plan of care start date: 07/30/20, end date: 10/28/20  Date of onset: 07/30/20    Medicare and other payers require an attending physician to certify the need for Rehab Therapy Services.  Your signature indicates you have reviewed and approved the Plan of Care (POC). To communicate an update or change in this plan, please add your comments below AND contact the therapist.    Patient name: MRN: DOB:   Autumn Camacho [id number] 07-Dec-1965       Physical Therapy  Physical Therapy Evaluation/Treatment    Patient Name: Autumn Camacho  MRN: [id number]  Today's Date: 07/30/2020    General Visit Information  General  Documentation Type: Initial Eval  Treatment Start Time: 1620  Treatment End Time: 2229  Treatment Duration (min): 55 Mins  Family/Caregiver Present: Yes    Subjective  Subjective  Patient Report/Self-Assessment: Pain increases with WBing and standing activity.  Pt also c/o nausea with standing.  Patient Stated Goal: Agreeable to PT.    Physical Therapy Diagnosis  PT Diagnosis: Pt is s/p right direct lateral THR, MD order for WBAT. (Chart incorrectly says she is s/p left TKR. Ortho team aware of incorrect DX and Therapy orders.)  PT Diagnosis (Continued): Acute dysmobiltiy due to pain and nausea.    Patient Active Problem List   Diagnosis   . Rheumatoid arthritis involving multiple sites ([place])   . Chronic right hip pain   . Rheumatoid arthritis ([place])   . Rheumatoid arthritis involving right hand ([place])   . 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 Osteoarthritis of right hip   . Osteoarthritis of right knee   . Knee dislocation, left, initial encounter   . Failed total left knee replacement ([place])   . Chronic instability of left knee   . Failed total knee replacement, subsequent encounter   . Status post revision of total replacement of left knee       Past Medical History:   Diagnosis Date    . Chronic right hip pain 02/26/2019   . Fracture     Bilateral elbow fx   . Rheumatoid arthritis ([place])    . Rheumatoid arthritis ([place])    . Spinal stenosis    . 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 foot Bilateral     Arch surgery   . KNEE ARTHROPLASTY      LEFT 2018 REPAIR 2019   . KNEE ARTHROPLASTY Left 05/01/2020   . PR ADENOIDECTOMY PRIMARY <AGE 39      AGE 59  T/A    . PR ANES; TOTAL KNEE REPLACEMENT Left 04/01/2020   . PR TONSILLECTOMY ONE-HALF <AGE 39     . PR UNLISTED PROCEDURE FEMUR/KNEE Bilateral     TKA   . PR UNLISTED PROCEDURE FEMUR/KNEE      Left revision TKA    . PR UNLISTED PROCEDURE FOOT/TOES Bilateral 2009 R,  2010 L    Reconstructive surgery   . TKA Left 2019    revision from prior TKA       Home Living  Home Living  Type of Home: House  Entry Stairs: 10  Entry Stairs Railings: Right railing  Indoor Stairs: 0  Lives With: Spouse  Mobility Equipment Owned: Front wheeled or pick-up walker;Single point cane  Prior Level of Function  Prior Function  Prior Level of Function: Independent with all functional mobility and ADLs  Details on Prior Level of Function: Ambulatory with SPC.  Assistance Available at Home: 24 hour supervision       07/30/20 1620 07/30/20 1630 07/30/20 1635   Vitals   BP 108/73 123/73 100/58   Pulse 87 83 96   SpO2 100 %  --   --    Oxygen Therapy None (Room air)  --   --    Patient Position Lying Sitting Standing     Pain  Pain Assessment  Pain Assessment: No/denies pain  Pain Score: 10  Pain Descriptor Scale: Moderate  Pain Intensity: Activity  Pain Location: r HIP  Pain Orientation: Right  Pain Descriptors: Burning  Pain Interventions: Medication (See MAR)    Cognition  Cognition  Orientation Level: Oriented X4    Extremity Assessments                                RLE Assessment  RLE Assessment: Exceptions to [facility]  RLE Assessment Comments: Tolerates ~ 60 degrees right hip flexion AROM.               LLE Assessment  LLE Assessment: Within Functional Limits(s/p left TKR revision in July.)                Sensation     Proprioception     Coordination     Perception     Vision/Vestibular     Anthropometric Measurements  Anthropometric Measurements  Height: 5\' 1"  (154.9 cm)  Weight: 79.1 kg (174 lb 6.1 oz)  Static Sitting Balance  Static Sitting Balance  Static Sitting Level of Assistance: Independent  Dynamic Sitting Balance  Dynamic Sitting Balance  Dynamic Sitting Balance Level of Assist: Independent  Static Standing Balance  Static Standing Balance  Static Standing Balance: Bilateral upper extremity supported  Static Standing Balance Level of Assistance: Supervision / Stand by assistance  Dynamic Standing Balance  Dynamic Standing Balance  Dynamic Standing Balance: Bilateral upper extremity supported  Dynamic Standing Balance Level of Assist: Supervision / Stand by assistance  Activity Tolerance       Bed Mobility  Bed Mobility  Supine>Sit: Supervision / Stand by assistance;Other (Comment)(with use of leg lifter for DL precautions.)  Sit>Supine: Minimum Assist (less than 25% help needed)(For right LE.)  Scooting: Independent  Transfers  Transfers  Sit<>Stand: Supervision / Stand by assistance;Assistive device used  Sit<>Stand Assistive Device Details: Front wheeled walker  Bed<>Chair/WC: Supervision / Stand by Retail banker  Distance: 15  Assistance Needs: Supervision / Stand by Electronics engineer Used: Front wheeled walker  Gait quality/descriptors: Other (Comment)(Antalgic. Very painful with weightbearing)  10 Meter Walk Test     15 Foot Walk Test     2 Minute Walk Test     6 Minute Walk Test     Stairs       Outcome Measures  [place] AM-PAC "6 Clicks" Basic Mobility Outcome Measure  Help needed climbing 3-5 steps with a railing?: Total  Help needed walking in hospital room?: A Little  Help needed moving to and from a bed to chair (including a  wheelchair)?: A Little  Difficulty moving from lying on back to sitting on the side of the bed?: A Little  Difficulty sitting down on and standing  up from a chair with arms (e.g., wheelchair, bedside commode, etc,.)?: A Little  Difficulty turning over in bed (including adjusting bedclothes, sheets and blankets)?: A Little  Score: 16  Confusion Assessment Method-ICU (CAM-ICU)  Feature 3: Altered Level of Consciousness: Positive  [place] Agitation Sedation Scale (RASS)  [place] Agitation Sedation Scale (RASS): Restless    Treatment  Therapeutic Exercise  Therapeutic Exercise  Therapeutic Exercise Time Entry: 15  Therapeutic Exercise Activity 1: Issued written DL HEP and instructed in therex of AP, GS, QS, heel slides and TKE x 10 reps each.  Therapeutic Exercise Activity 2: Taught DL THR precautions.  Therapeutic Activity  Therapeutic Activity  Therapeutic Activity Time Entry: 10  Therapeutic Activity 1: Instructed in bed mobility and transfers with DL hip precautions maintained.  Neuromuscular Re-Education     Gait Training     Other Activities       Assessment/Plan  PT Assessment Results: Decreased strength;Decreased range of motion;Decreased endurance;Pain     Impact on Function and Participation: Very pleasant 54 yo female admitted for right direct lateral THR. MD  verbal orders for WBAT, DL precautions. At baseline, she is ambulatory with a SPC. She has a PMH of RA and multiple joint replacements, including left TKR revision in July.  She is very positive and motivated for PT but is limited today by right thigh pain that worsens with ambulation. She is able to mobilize in her room with SBA. Anticipate that she will progress toward her PT goals for safe DC to home by POD #1. Recommend PT order her a leg lifter. Recommend PT for stair training with 1 rail and SPC and progress gait to 76' with FWW.  Prognosis: Excellent        Treatment/Interventions: Gait/Stairs training;Therapeutic functional  activities;Therapeutic exercise  PT Plan: Skilled PT  PT Dosage: 1-2x/day, 5-6 days/week  PT Discharge Recommendations: Follow up with PT at next level of care (to be determined by primary team)        Equipment Recommended: Front wheeled walker(Pt has own FWW but needs PT to order her a leg lifter.)    Barriers to Discharge  Barriers to Discharge: Pain      Goals  STG  Goal: Bed mobility;Independent;Other (Comment)(with leg lifter to maintain DL hip precautions.)  Estimated Completion Date: 07/31/20  Goal Status: Progressing  STG  Goal: Transfer with AD;Independent;Other (Comment)(FWW)  Estimated Completion Date: 07/31/20  Goal Status: Progressing  STG  Goal: Amb with AD;Independent;Other (Comment)(FWW x 50'.)  Estimated Completion Date: 07/31/20  Goal Status: Progressing  STG  Goal: Able to maintain DL precautions. Verbally instruct in car transfers.  Estimated Completion Date: 07/31/20  Goal Status: Progressing  STG  Goal: SBA for stairs x 10 with step-to gait pattern with 1 rail and SPC.  Estimated Completion Date: 07/31/20  Goal Status: Progressing                                           Santiago Glad ([facility]) Angius [place], [place]  07/30/2020

## 2020-07-30 NOTE — Op Note (Signed)
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  ASA: ASA status not filed in the log.  Estimated Blood Loss: * No values recorded between 07/30/2020  9:36 AM and 07/30/2020 10:55 AM *  Total IV Fluids: 1000 mL  Wound Class: Class I/ Clean  Drains:   Urethral Catheter 07/30/20 (Active)       Implants     Type Name Action Serial No.      Arthroplasty LINER 50MM 32MM STD LONGEVITY TRILOGY - [id number] - [id number] Implanted [id number]     Arthroplasty SHELL ACETABULAR Maxwell Caul 52MM PRIM - [id number] - [id number] Implanted [id number]     Screw SCREW ACETABULAR TRILOGY 6.5MM 40MM SELF TAP [id number] - [id number] - [id number] Implanted [id number]     Arthroplasty STEM FEMORAL ZIMMER M/L TAPER 7.5MM 114MM REDUCED NECK STD OFFSET - [id number] - [id number] Implanted [id number]     Arthroplasty CEMENT BONE PALACOS R 40 GM HIGH VISCOSITY - [id number] - [id number] Implanted [id number]     Arthroplasty STEM FEMORAL CPT 105MMX7.5MM 0-STD OFFSET - [id number] - [id number] Implanted [id number]     Arthroplasty HEAD FEMORAL TRILOGY IT BIOLOX DELTA 32MM +7MM 12/14 - [id number] - [id number] Implanted [id number]           PREOPERATIVE DIAGNOSIS  Severe DJD, right hip.       POSTOPERATIVE DIAGNOSIS  Severe DJD, right hip.       OPERATION  Right total hip arthroplasty, hybrid       ACTIVE COMORBID CONDITIONS  Rheumatoid arthritis    SURGEON  Rondall Allegra, MD  [facility], Attending Physician       PRESENCE  STATEMENT  I certify that I am the attending on the procedure, that I was present throughout the procedure, and that I performed all key elements of the procedure.       ASSISTANTS  Westley Hummer, MD  Crista Elliot, PA-C  Cyndia Diver, MS IV          ANESTHESIA  Spinal.       ESTIMATED BLOOD LOSS  300 cc.       IV FLUIDS  1000 cc crystalloid.       URINE OUTPUT  100 cc.       DRAINS PLACED  None.       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  At the conclusion of the procedure, we had achieved a stable right hip replacement with no subluxation or dislocation in any of the following positions.  1.     90 degrees external rotation with the hip fully extended.  2.     Full hip flexion, abduction and external rotation.  3.     90 degrees of flexion with 70 degrees or internal rotation and 20 degrees of adduction across the midline.  4.  Midranges of flexion with full internal and external rotation.      Soft-tissue tension was appropriate, in that we could easily extend the hip fully and flex the knee fully without springiness. Leg length was estimated to have been 1cm short based on pre-op templating and intra-operative measurements.       COMPONENTS IMPLANTED  Zimmer Trilogy size 52 acetabular shell with a 32 mm inner diameter Longevity crosslinked polyethylene liner and 1 supplemental bone screw placed posterosuperiorly.    On the femur we used a Zimmer CPT stem size 0 with a +7 x 32 mm ceramic femoral head.        DISPOSITION  There were no complications.  The patient tolerated the procedure well and went to recovery in good condition.       INDICATIONS  The patient is a 54 year old female with severe right hip arthritis and activity-limiting pain, who presented for total hip replacement.  The risks of this procedure and the alternatives to this procedure were reviewed at some length in advance.  We emphasized those areas where we perceived the risk in this patient's case to be more severe than  the typical patient undergoing this operation.  The patient nonetheless desired to proceed.       OPERATIVE TECHNIQUE  The patient was identified, taken to the operating room, and anesthetized.  The patient was then positioned in the lateral decubitus position with the operative hip up.  All bony prominences and subcutaneous nerves were well padded and an axillary roll was used.  The hip and lower extremity were prepped and draped sterilely.  I then created a direct lateral skin incision and a modified direct lateral (anterolateral/modified Hardinge) approach.  We dissected down to the fascia, incised the fascia in line with the skin incision and then created a flap consisting of the anterior 30% of the gluteus medius elevated in continuity with the anterior 30% of the vastus lateralis.  The gluteus minimus was elevated in the same direction and I excised the anterior hip capsule.  Prior to dislocation, I performed a stab wound directly over the iliac crest, and inserted a 7/64ths fully threaded Steinmann pin into the iliac crest.  I then placed the Harris leg length caliper, and marked multiple sites on the femur for measurement, recording each one.  I dislocated the hip anteriorly and osteotomized the femoral neck at the level preoperatively templated.  We brought the leg back on the table and placed two self-retainers, exposing the acetabulum.  These were placed under direct visualization.  No posterior soft tissue retractors were used.  I placed an anteroinferior retractor directly against bone.  I excised the acetabular labrum and incised the inferior hip capsule and transverse ligament.  We prepared the acetabulum using motorized reamers, orienting them  in 40 to 45 degrees cup abduction, 15 to 20 degrees cup anteversion.  The final reamer, which filled the cup nicely was size 50, so we irrigated and dried the acetabulum and impacted the Zimmer Trilogy acetabular shell size 52 in 40 to 45 degrees cup  abduction, 15 to 20 degrees cup anteversion. It had excellent press fit.  We augmented this with one posterosuperior bone screw placed using standard technique.  I washed and dried the socket and impacted the polyethylene liner, verifying that the locking mechanism engaged.  We then turned our attention to the femur.  We created a channel within the metaphysis using a canal finder and box osteotome.  Next, we prepared the femur  using handheld broaches in 15 degrees of anteversion and estimated neutral varus/valgus alignment.  The final broach had excellent press fit at the desired level.  Trial reductions revealed the stability, soft-tissue tension and leg length described above.  We removed the broach, and impacted the Zimmer size 7.5 ML taper stem in 15 degrees of anteversion.  It had excellent press fit at the desired level.  However, we noted persistent instability with anterior provocation.  Despite the stem being placed in version which matched the patient's anatomy, it was clear that the stem was excessively anteverted.  We therefore switched to a cemented stem, and broached the canal using the CPT system.   We removed the broach, and prepared the femoral canal using third generation cementing techniques.  These included the use of a distal cement restrictor, the use of pressure irrigation to remove marrow and residual bone from the canal, and the use of epinephrine soaked sponges within the canal to achieve hemostasis.  Next, we vacuum-mixed two bags of [place] PMMA cement to a doughy consistency and injected the cement retrograde under pressure.  The heated Zimmer CPT stem was inserted with gentle pressure into the cement mantle, taking care to maintain appropriate version and length, with a minimum mantle of 2 mm circumferentially.  The cement was allowed to cure, and the excess cement trimmed away.  I washed and dried the trunion and impacted the 32 mm x +7 neck length ceramic femoral head.    We washed  and dried the socket, reduced the joint, verified limb length, soft tissue tension, and joint stability one final time.  We obtained final hemostasis and copiously irrigated the surgical field.  We closed the abductor sleeve with #1 Vicryl.  We then closed the wound in layers in routine fashion with a running barbed suture.  No drain was used because hemostasis was excellent.  We irrigated copiously with pulsatile lavage between each layer of our closure.  We placed a dressing and brought the patient to the recovery room.  There were no intraoperative complications.       POSTOPERATIVE PLAN  Weightbearing as tolerated.  Anterolateral hip protocol, with appropriate dislocation precautions.  Thromboprophylaxis will be with ASA, sequential compression devices, and TED hose in accordance with the AAOS practice guideline on prevention of pulmonary embolism.  Analgesia will be with oral and rgional agents.  Cefazolin 2gm iv was given between 10 and 60 minutes prior to skin incision. Antibiotics will continue postoperatively for 24 hours, at which point they will be discontinued.

## 2020-07-30 NOTE — Nursing Note (Signed)
AOx3; on room air; tolerates diet; good PO intake. PRN morphine and oxycodone for pain. Husband at bedside. VSS. Worked with PT today.

## 2020-07-30 NOTE — H&P (Signed)
See recent clinic notes.  No change.      Gates Jividen, MD  FRCSC  Hip and Knee Arthritis  Professor  Department of Orthopaedics and Sports Medicine  [place] of 

## 2020-07-31 DIAGNOSIS — R69 Illness, unspecified: Secondary | ICD-10-CM

## 2020-07-31 DIAGNOSIS — Z96641 Presence of right artificial hip joint: Secondary | ICD-10-CM

## 2020-07-31 LAB — BASIC METABOLIC PANEL
Anion Gap: 4 (ref 4–12)
Calcium: 8.5 mg/dL — ABNORMAL LOW (ref 8.9–10.2)
Carbon Dioxide, Total: 28 meq/L (ref 22–32)
Chloride: 106 meq/L (ref 98–108)
Creatinine: 0.47 mg/dL (ref 0.38–1.02)
Glucose: 92 mg/dL (ref 62–125)
Potassium: 4.5 meq/L (ref 3.6–5.2)
Sodium: 138 meq/L (ref 135–145)
Urea Nitrogen: 7 mg/dL — ABNORMAL LOW (ref 8–21)
eGFR by CKD-EPI: >60 mL/min/{1.73_m2} (ref >59)

## 2020-07-31 LAB — CBC (HEMOGRAM)
Hematocrit: 33 % — ABNORMAL LOW (ref 36–45)
Hemoglobin: 10.4 g/dL — ABNORMAL LOW (ref 11.5–15.5)
MCH: 31.3 pg (ref 27.3–33.6)
MCHC: 31.1 g/dL — ABNORMAL LOW (ref 32.2–36.5)
MCV: 101 fL — ABNORMAL HIGH (ref 81–98)
Platelet Count: 194 10*3/uL (ref 150–400)
RBC: 3.32 10*6/uL — ABNORMAL LOW (ref 3.80–5.00)
RDW-CV: 13.7 % (ref 11.6–14.4)
WBC: 5.23 10*3/uL (ref 4.3–10.0)

## 2020-07-31 MED ORDER — ONDANSETRON HCL 4 MG OR TABS
4.0000 mg | ORAL_TABLET | Freq: Three times a day (TID) | ORAL | 0 refills | Status: DC | PRN
Start: 2020-07-31 — End: 2020-11-15

## 2020-07-31 MED ORDER — OXYCODONE HCL 5 MG OR TABS
5.0000 mg | ORAL_TABLET | ORAL | 0 refills | Status: DC | PRN
Start: 2020-07-31 — End: 2020-11-15

## 2020-07-31 MED ORDER — SENNOSIDES 8.6 MG OR TABS
17.2000 mg | ORAL_TABLET | Freq: Two times a day (BID) | ORAL | 0 refills | Status: DC | PRN
Start: 2020-07-31 — End: 2020-11-15

## 2020-07-31 MED ORDER — ACETAMINOPHEN 500 MG OR TABS
1000.0000 mg | ORAL_TABLET | Freq: Three times a day (TID) | ORAL | 0 refills | Status: DC
Start: 2020-07-31 — End: 2021-08-14

## 2020-07-31 MED ORDER — RINVOQ 15 MG OR TB24
15.0000 mg | EXTENDED_RELEASE_TABLET | Freq: Every morning | ORAL | 0 refills | Status: DC
Start: 2020-08-14 — End: 2021-03-05

## 2020-07-31 MED ORDER — ASPIRIN 81 MG OR TBEC
81.0000 mg | DELAYED_RELEASE_TABLET | Freq: Two times a day (BID) | ORAL | 0 refills | Status: DC
Start: 2020-07-31 — End: 2020-11-15

## 2020-07-31 MED ORDER — TRAMADOL HCL 50 MG OR TABS
50.0000 mg | ORAL_TABLET | Freq: Four times a day (QID) | ORAL | 0 refills | Status: DC | PRN
Start: 2020-07-31 — End: 2021-04-16

## 2020-07-31 NOTE — Progress Notes (Signed)
Medicare Certification  Plan of care start date: 07/30/20, end date: 10/29/20  Date of onset: 07/30/20    Medicare and other payers require an attending physician to certify the need for Rehab Therapy Services.  Your signature indicates you have reviewed and approved the Plan of Care (POC). To communicate an update or change in this plan, please add your comments below AND contact the therapist.    Patient name: MRN: DOB:   Autumn Camacho [id number] 07/27/1966   Occupational Therapy Evaluation/Treatment     Patient Name: Autumn Camacho  MRN: [id number]  Today's Date: 07/31/2020    General Visit Information  General  Documentation Type: Initial Eval  Treatment Start Time: 0815  Treatment End Time: 0900  Treatment Duration (min): 45 Minutes  Family/Caregiver Present: Yes  OT Last Visit  OT Received On: 07/31/20  OT Diagnosis: impaired self care    Patient Active Problem List   Diagnosis   . Rheumatoid arthritis involving multiple sites ([place])   . Chronic right hip pain   . Rheumatoid arthritis ([place])   . Rheumatoid arthritis involving right hand ([place])   . 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 Osteoarthritis of right hip   . Osteoarthritis of right knee   . Knee dislocation, left, initial encounter   . Failed total left knee replacement ([place])   . Chronic instability of left knee   . Failed total knee replacement, subsequent encounter   . Status post revision of total replacement of left knee       Past Medical History:   Diagnosis Date   . Chronic right hip pain 02/26/2019   . Fracture     Bilateral elbow fx   . Rheumatoid arthritis ([place])    . Rheumatoid arthritis ([place])    . Spinal stenosis    . 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 foot Bilateral     Arch surgery   . KNEE ARTHROPLASTY      LEFT 2018 REPAIR 2019   . KNEE ARTHROPLASTY Left  05/01/2020   . PR ADENOIDECTOMY PRIMARY <AGE 69      AGE 88  T/A    . PR ANES; TOTAL KNEE REPLACEMENT Left 04/01/2020   . PR TONSILLECTOMY ONE-HALF <AGE 69     . PR UNLISTED PROCEDURE FEMUR/KNEE Bilateral     TKA   . PR UNLISTED PROCEDURE FEMUR/KNEE      Left revision TKA    . PR UNLISTED PROCEDURE FOOT/TOES Bilateral 2009 R,  2010 L    Reconstructive surgery   . TKA Left 2019    revision from prior TKA       Subjective: cheerful and cooperative       Home Living  Home Living  Type of Home: House  Entry Stairs: 10  Entry Stairs Railings: Right railing  Indoor Stairs: 0  Lives With: Spouse  Bathroom Shower/Tub: Administrator, [employer] Service: ADA height  Mobility Equipment Owned: Front wheeled or pick-up Set designer Owned: Grab bars in shower;Shower chair without back    Prior Function  Prior Function  BADLs: Requires assist  IADLs: Other (Comment)  Functional Mobility: Independent  Assistance Available at Home: 24 hour assistance    Pain  Pain Assessment  Pain Assessment: Pain Scale  Pain Score: 2  Pain Descriptor Scale: Moderate  Pain Intensity: Rest  Pain Location: r HIP  Pain Orientation: Right  Pain Descriptors: Burning  Pain Interventions: Medication (See MAR)      Extremity Assessments  RUE Assessment  RUE Assessment: Within Functional Limits                       LUE Assessment  LUE Assessment: Within Functional Limits                                                            Self-Care/Home Management  Self-Care/Home Management  Time Entry (min): 23 Minutes  Lower Body Dressing: Contact guard assist (touching/steadying assistance)  Toileting: Supervision / Stand by assistance    Cognition  Cognition  Orientation Level: Oriented X4         Bed Mobility  Bed Mobility  Supine>Sit: Supervision / Stand by assistance  Transfers  Transfers  Sit<>Stand: Supervision / Stand by assistance;Assistive device used  Sit<>Stand Assistive Device Details: Front wheeled walker  Bed<>Chair/WC:  Supervision / Stand by Administrator, arts: Supervision / Stand by [employer] device used  Chartered loss adjuster Details: Grab bars;Front wheeled walker  Tub Transfer: Supervision / Stand by assistance       Assessment/Plan  OT Assessment Results: Impaired ADL status;Impaired functional mobility     54 yo female sp right THA. Direct lateral, WBAT. Lives c husband and adult dtr in house c basement, pt stays on main floor. 9 STE c HR. Assist c LB dressing, HLs. Amb c spc.     Today pt received in bed. EDu hip precautions. Pt observed during session.    A&O times 4. B UE WFL but hand function compromised by RA. Supine>sit supervision c pt using leg lifter. Sit>stand c supervision and FWW. Amb to BR c supervision, transferred on/off standard height toilet c supervision using GBs. All toileting tasks mod I. Amb to chair.  edu LB dressing c reacher and sock aid. Pt able to don/doff socks and pants c supervision. Provided resources and handout re the above.   Edu TTB transfer. Pt able to simulate c supervision, using leg lifter. Provided hand out and resources to procure above.   Pt left in chair c needs met.   Anticipate pt will be able to dc home c husband and dtr to assist when medically stable and PT clears.   Plan: DC OT.      Prognosis: Excellent          Treatment Interventions: Self-care/Home management     OT Plan: Skilled OT        OT Discharge Recommendations: Assistance with  Assistance with Details: ADLs;IADLs     Equipment Recommended: Tub seat with back    Goals  STG  Goal: Lower body dressing;Independent;Adaptive equipment  STG  Goal: Toilet transfer;Be independent or direct caregivers in management of orthosis  STG  Goal: Tub/shower transfer;Supervision / Stand by assistance  Goal Status: Met  Marolyn Haller, OT  07/31/2020

## 2020-07-31 NOTE — Nursing Note (Signed)
Alert and oriented times four, can make needs known. 2L 02 via NC overnight. Denies nausea/vomiting. Oxycodone and tramadol PRN and scheduled tylenol and tordol effective for her pain relief. Will D/C foley catheter this AM per protocol. CMS intact. CTM

## 2020-07-31 NOTE — Discharge Instructions (Signed)
Dear Autumn Camacho,     It was a pleasure working with you. Please see below for your discharge instructions and do not hesitate to reach out with any questions or concerns. Keep up the good work!    Sincerely,     Autumn Camacho, ARNP     Dr. Derek Camacho Discharge Instructions following Hip Replacement    . Blood Clot Prevention  Continue to take Aspirin 81mg  twice daily for the next 6 weeks to prevent blood clots. Wear your compressions stocking at all times (except when showering) for the next four weeks.  Signs of a blood clot in the leg can include pain, redness and warmth in the calf area. Should you have these signs, please contact the clinic or the after-hours covering physician.     . Pain Management  You are being discharged with several medications for pain management, both narcotic and non-narcotic. Always begin with ice and elevation, and then move to medication. Please follow the guidelines below. The medications work best and are designed to be taken together.    Scheduled Medication:  1. Acetaminophen (Tylenol) 1000mg  by mouth every 8 hours. Do not exceed 3000mg  Tylenol in a 24 hour period. Tylenol potentiates narcotics and patients report improved pain relief when used in combination. Take Tylenol in a scheduled fashion for at least the first 5 days following surgery, after which you may transition to Tylenol as needed.       As Needed Medications:  If the above regimen does not provide adequate relief, you may add the following;  1. Oxycodone 5-10mg  by mouth every 4 hours for severe pain. Oxycodone is a potent narcotic reserved for severe pain only.  Oxycodone should be sparingly in the first days to few weeks following surgery. You should wean off oxycodone as soon as you are able to tolerate.     2. Tramadol 50mg  by mouth every 6 hours as needed for moderate pain. Tramadol is a less potent narcotic used to treat mild to moderate pain. It is okay to take Tramadol in addition to oxycodone, however use caution  as this may cause sedation.    Per Autumn Camacho, you may now choose to partially fill a narcotic prescription. Should you choose to do so, the pharmacy will hold the remainder of your narcotic pills to dispense if you need them for 4 weeks following surgery (must be filled within 30 days of the date that the prescription was written).    *Do not drive, operate heavy machinery, drink alcohol or take illicit drugs while taking narcotics (Tramadol, Oxycodone).   Narcotics can be constipating. In addition to the Senna prescribed, you may add over the counter Miralax as needed should constipation be a problem. Be sure to drink plenty of water while taking narcotics.     *You will have the ability to obtain narcotic refills at your follow-up clinic visit in 2 weeks. Should you need a refill prior to that, please call the clinic [phone]. Please note, narcotic prescriptions require a 2 business day notice for refills.    . Wound Care  You must remove your surgical dressing and shower / use soap on post-operative day 4. Dr. Catalina Camacho prefers his patients to gently wash the incision with soap, then pat dry. No scrubbing. The clear tape-looking dressing directly on your incision should remain in place until your first follow-up with Dr. Catalina Camacho. It is okay to get this clear tape-looking dressing wet. No soaking the incision such as in  a bath or hot tub. No swimming.     Autumn Camacho  Continue to use ice for the next several days after discharge. We recommend filling 3 or 4 gallon size ziplock bag with 90% water and 10% rubbing alcohol. Place them in the freezer and rotate use. In this solution, it will freeze into a slushy consistency, allowing easier use against the incision. Apply the ice three to four times daily for 20-30 minutes at a time.     Call the clinic for any concern of wound redness, increasing pain or drainage.     . Nausea  You are being discharged with an anti-nausea medication, Ondansetron (Zofran). You may take  this as needed for nausea. Keep in mind that constipation can sometimes be the culprit of nausea, so also evaluate whether or not you are having regular bowel movements.     . Preventing Infection  Avoid any dental work or non-urgent procedures for three months following surgery. If you have diabetes, controlling your blood sugars is important in wound healing and preventing infection. If you develop an infection in another area of your body (bladder, skin, ear, etc), be sure that your primary care provider is aware that you have an artificial joint.     You may require a prophylactic antibiotic prior to any invasive procedure (root canal,  colonoscopy, cardiac catheterization etc). If you have a question regarding whether or not an antibiotic is needed, please call Dr. Derek Camacho office.     . Activity  You should begin outpatient physical therapy within a few days of discharge. We recommend two to three physical therapy sessions weekly, as you tolerate.  Your new joint is designed for average daily activities, not high impart sports. Walking, swimming and cycling are recommended once cleared by Dr. Catalina Camacho. Aggressive sports such as jogging, running or jumping may impair or compromise the function and long term success of your new joint. You may resume sexual activity as soon as it is comfortable for you, as long as you follow your hip dislocation precautions after surgery.     . Driving  You may not be ready to resume driving for a few weeks. If you had surgery on your right leg, you should not drive for at least one month. If your surgery was on your left leg, you may return to driving as you feel comfortable, providing that you have an automatic transmission. No driving while taking narcotics.     . Return to Work  This depends on your profession. Typically, if your work is primarily sedentary you may return after 3-4 weeks. If your work is vigorous, you may require 2-3 months before you can return to full duty.  Dr. Catalina Camacho can assist in determining when it's appropriate for you to return to work.    . Indications to call Dr. Catalina Camacho  o Fever greater than 101 degrees.   o Changes in your incision; opening, draining, redness, tenderness  o A sudden 'giving way ' of your hip with inability to bear weight  o Numbness, tingling or loss of function in your operate leg.   o Increasing pain not treated with pain medications.     Monday through Friday call the clinic [phone]. After hours or on the weekends, call [phone] and request the Orthopaedic Resident Physician covering Dr. Catalina Camacho.

## 2020-07-31 NOTE — Progress Notes (Signed)
Progress Note     Autumn Camacho") - DOB: Jun 08, 1966 (54 year old female)  Gender Identity: Female  Preferred Pronouns: she/her/hers  Admit Date: 07/30/2020  Code Status: Full Code       CHIEF CONCERN / IDENTIFICATION:  Autumn Camacho is a 54 year old female s/p R THA 10/12 w/ Dr. Catalina Camacho     SUBJECTIVE       INTERVAL HISTORY:  NAEO. Pain controlled. Worked w/ PT yesterday and anticipate one more session today then DC.     SCHEDULED MEDICATIONS:   .  acetaminophen, 1,000 mg, TID  .  aspirin, 81 mg, BID  .  gabapentin, 300 mg, BID  .  ketorolac, 15 mg, q6h  .  polyethylene glycol 3350, 17 g, Daily    INFUSED MEDICATIONS:  lactated ringers, 50 mL/hr, Continuous, Last Rate: 50 mL/hr (07/30/20 0835)  .  lactated ringers, 100 mL/hr, Continuous, Last Rate: 100 mL/hr (07/30/20 1240)  .  sodium chloride 0.9% with KCl 20 mEq/L, 100 mL/hr, Continuous, Last Rate: 100 mL/hr (07/31/20 0252)     PRN MEDICATIONS:  aluminum & magnesium hydroxide-simethicone, 30 mL, QID PRN  .  bisacodyl, 10 mg, Daily PRN  .  bisacodyl, 10 mg, Daily PRN  .  calcium carbonate, 1,000 mg, TID PRN  .  magnesium citrate, 150 mL, Daily PRN  .  melatonin, 3 mg, q HS PRN  .  morphine, 1-2 mg, q4h PRN  .  ondansetron, 4 mg, q8h PRN  .  ondansetron, 4 mg, q8h PRN  .  oxyCODONE, 5-10 mg, q4h PRN  .  senna, 17.2 mg, BID PRN  .  traMADol, 50 mg, q6h PRN       OBJECTIVE     Vitals (Most recent in last 24 hrs)     T: 36.5 C (07/31/20 0545)  BP: 97/60 (07/31/20 0545)  HR: 76 (07/31/20 0545)  RR: 16 (07/31/20 0545)  SpO2: 99 % (07/31/20 0545) Nasal cannula 2 L/min    T range: Temp  Min: 36 C  Max: 36.8 C  Admit weight: 79.1 kg (174 lb 6.1 oz) (07/30/20 0812)  Last weight: 79.1 kg (174 lb 6.1 oz) (07/30/20 1287)       I&Os:     Intake/Output Summary (Last 24 hours) at 07/31/2020 0809  Last data filed at 07/31/2020 0010  Intake 2240 ml   Output 1550 ml   Net 690 ml       Physical Exam  General: Resting in bed, NAD   Neuro: Answers questions  appropriately and follows instructions w/o difficulty  Resp: Equal bilateral chest rise   CV: Extremities WWP   MSK: RLE - dressing c/d/i. Toes WWP. Demonstrates active great toe flexion/extension and ankle dorsiflexion/plantarflexion. SILT in the saphenous, sural, SPN, DPN, and tibial distributions.       Labs (last 24 hours):   Chemistries  CBC  LFT  Gases, other   138 106 7 92   10.4   AST: - ALT: -  -/-/-/-  -/-/-/-   4.5 28 0.47   5.23 >< 194  AP: - T bili: -  Lact (a): - Lact (v): -   eGFR: >60 Ca: 8.5   33   Prot: - Alb: -  Trop I: - D-dimer: -   Mg: - PO4: -  ANC: -     BNP: - Anti-Xa: -     ALC: -    INR: -  ASSESSMENT/PLAN      65F s/p R TKA 10/12    Activity: WBAT RLE. ROMAT.   Wound Care: Prineo dressing   Antibiotics: 24h peri op cefazolin   DVT Ppx: ASA '81mg'$  BID   Pain: Multimodal  Diet: General   TLD: DC foley this AM   Consults: PT/OT  Dispo: Anticipate DC home POD 1  Follow Up: 2 weeks post op

## 2020-07-31 NOTE — Progress Notes (Signed)
Physical Therapy  Physical Therapy Treatment    Patient Name: Autumn Camacho  MRN: [id number]  Today's Date: 07/31/2020      General Visit Information  PT Last Visit  PT Received On: 07/31/20  Response to Previous Treatment: Patient with no complaints from previous session.    General  Documentation Type: Progress  Treatment Start Time: 3086  Treatment End Time: 0944  Treatment Duration (min): 45 Mins  Family/Caregiver Present: No    Subjective  Subjective  Patient Report/Self-Assessment: Pt in chair, agreeable to PT    Patient Active Problem List   Diagnosis   . Rheumatoid arthritis involving multiple sites ([place])   . Chronic right hip pain   . Rheumatoid arthritis ([place])   . Rheumatoid arthritis involving right hand ([place])   . 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 Osteoarthritis of right hip   . Osteoarthritis of right knee   . Knee dislocation, left, initial encounter   . Failed total left knee replacement ([place])   . Chronic instability of left knee   . Failed total knee replacement, subsequent encounter   . Status post revision of total replacement of left knee        07/31/20 0930   Vitals   BP 100/65   Pulse 74   Patient Position Sitting  (after ambulating)       Pain  Pain Assessment  Pain Assessment: Pain Scale  Pain Score: 2  Pain Descriptor Scale: Moderate  Pain Intensity: Rest  Pain Location: r HIP  Pain Orientation: Right  Pain Descriptors: Burning  Pain Interventions: Medication (See MAR)    Cognition  Cognition  Orientation Level: Oriented X4  Bed Mobility     Transfers  Transfers  Sit<>Stand: Independent  Sit<>Stand Assistive Device Details: Psychologist, clinical  Distance: 100'  Assistance Needs: Supervision / Stand by Electronics engineer Used: Front wheeled walker  Gait quality/descriptors: Decreased step length  Stairs  Stairs  Number of steps:  4  Style: Step to  Assistance Needs: Supervision / Stand by Electronics engineer Used: 1 railing;Single point cane    Treatment  Therapeutic Exercise  Therapeutic Exercise  Therapeutic Exercise Time Entry: 15  Therapeutic Exercise Activity 1: HEP per THA protocol  Therapeutic Activity  Therapeutic Activity  Therapeutic Activity Time Entry: 15  Therapeutic Activity 1: Transfers - sit to/from stand using FWW  Therapeutic Activity 2: Discussed car transfer  Therapeutic Activity 3: Stair training - 4 steps with 1 rail and SPC. Cues for sequencing  Neuromuscular Re-Education     Gait Training  Gait Training  Gait Training Time Entry: 15  Gait Training Activity 1: Ambulated 100' with FWW and supervision. Cues for reciprocal gait. Pt reported feeling "flushed and nauseous", improved when returned to sitting   Wheelchair Management     Orthotic Management     Other Activities              Impact on Function and Participation: Pt is progressing well with mobility. Reported improved pain control today compared to yesterday. Was able to stand and ambulate 100' with FWW and supervision. Reported feeling flushed and nauseous after ambulating, BP 100/65 and received IV anti-nausea meds. Pt was then able to complete stair training. Pt has met PT goals and is ready to d/c from PT perspective. No further acute  PT needs at this time.  Prognosis: Excellent                PT Discharge Recommendations: Follow up with PT at next level of care (to be determined by primary team)        Equipment Recommended: Hidden [place] wheeled walker(leg lifter)      Goals  STG  Goal: Bed mobility;Independent;Other (Comment)  Estimated Completion Date: 07/31/20  Goal Status: Adequate for discharge  STG  Goal: Transfer with AD;Independent;Other (Comment)  Estimated Completion Date: 07/31/20  Goal Status: Met  STG  Goal: Amb with AD;Independent;Other (Comment)  Estimated Completion Date: 07/31/20  Goal Status: Adequate for discharge  STG  Goal: Able to  maintain DL precautions. Verbally instruct in car transfers.  Estimated Completion Date: 07/31/20  Goal Status: Met  STG  Goal: SBA for stairs x 10 with step-to gait pattern with 1 rail and SPC.  Estimated Completion Date: 07/31/20  Goal Status: Adequate for discharge                                           Lowella Grip, PT  07/31/2020

## 2020-07-31 NOTE — Discharge Summary (Signed)
Discharge Summary     [facility] [name]") - DOB: July 18, 1966 (54 year old female)  Gender Identity: Female  Preferred Pronouns: she/her/hers  PCP: Terressa Koyanagi, MD   Code Status: Full Code        DATE OF ADMISSION: 07/30/2020  DATE OF DISCHARGE: 07/31/2020  DISCHARGE TEAM & ATTENDING: Orthopedics & Manner, Vevelyn Royals, MD     ADMISSION DIAGNOSIS: Right Hip osteoarthritis  DISCHARGE DIAGNOSIS: Right Hip osteoarthritis    PROBLEMS ADDRESSED DURING THIS HOSPITALIZATION:   Principal Problem:    Status post revision of total replacement of left knee  Resolved Problems:    * No resolved hospital problems. *      DISCHARGE FOLLOW-UP VISITS/APPOINTMENTS:    Upcoming appointments at [facility]:  Future Appointments   Date Time Provider [facility]   08/06/2020  8:00 AM Vanice Sarah, PT [facility] PT   08/08/2020  9:15 AM [facility] XRAY ROOM [address] [place] [address]   08/08/2020  9:30 AM Manner, Vevelyn Royals, MD [facility] [place] [address]   09/11/2020  8:40 AM Crista Elliot Williemae Natter, PA-C [facility] [place] [address]        Additional follow-up:  No follow-up provider specified.    PENDING RESULTS THAT REQUIRE FOLLOW-UP (as of this summary):  Pending Labs     No pending labs          DIAGNOSTIC STUDIES RECOMMENDED:  Post Op images in 2 weeks     INCIDENTAL FINDINGS THAT REQUIRE FOLLOW-UP:   None      THERAPEUTIC RECOMMENDATIONS:   WBAT. ROMAT. Shower with incision uncovered on POD 4. No soaking or submerging incision in any form of water until cleared by surgeon. Call for any fevers, chills, drainage, or increasing pain.       ALLERGIES:  Fentanyl, Hydromorphone, Remicade [infliximab], and Nortriptyline      DISCHARGE MEDICATIONS:   Discharge Medication List as of 07/31/2020 12:03 PM      START taking these medications    Details   aspirin 81 MG EC tablet Take 1 tablet (81 mg) by mouth 2 times a day.Disp-84 tablet, R-0, Normal      ondansetron 4 MG tablet Take 1 tablet (4 mg) by mouth every 8 hours as needed for  nausea/vomiting.Disp-20 tablet, R-0, Normal      oxyCODONE 5 MG tablet Take 1-2 tablets (5-10 mg) by mouth every 4 hours as needed for moderate pain or severe pain.Disp-42 tablet, R-0, NormalExempt      senna 8.6 MG tablet Take 2 tablets (17.2 mg) by mouth 2 times a day as needed for constipation.Disp-30 tablet, R-0, Normal      traMADol 50 MG tablet Take 1 tablet (50 mg) by mouth every 6 hours as needed for moderate pain.Disp-42 tablet, R-0, NormalExempt         CONTINUE these medications which have CHANGED    Details   acetaminophen 500 MG tablet Take 2 tablets (1,000 mg) by mouth 3 times a day.Disp-90 tablet, R-0, Normal      upadacitinib ER (Rinvoq) 15 MG 24 hour tablet Take 1 tablet (15 mg) by mouth every morning. Hold for 2 weeksDisp-1 tablet, R-0, Not Printed         CONTINUE these medications which have NOT CHANGED    Details   gabapentin 300 MG capsule Take 300 mg in the morning and 600 mg at bedtimeDisp-90 capsule, R-2, Normal      valACYclovir 1 g tablet TAKE 1 TABLET(1000  MG) BY MOUTH DAILYDisp-30 tablet, R-5, Normal         STOP taking these medications       meloxicam 7.5 MG tablet Comments:   Reason for Stopping:               BRIEF ADMISSION HISTORY:   Please see note below from Dr. Catalina Gravel on 07/30/2020:    The patient is a 54 year old female with severe right hip arthritis and activity-limiting pain, who presented for total hip replacement.  The risks of this procedure and the alternatives to this procedure were reviewed at some length in advance.  We emphasized those areas where we perceived the risk in this patient's case to be more severe than the typical patient undergoing this operation.  The patient nonetheless desired to proceed.    HOSPITAL COURSE:   Autumn Camacho was admitted following a Right Total Hip Arthroplasty  which was well tolerated. There were no intraoperative complications. After a brief stay in the PACU, the patient transferred to the Surgical Floor for ongoing medical  monitoring and recovery.     Routine prophylaxis were initiated: TED hose, SCDs, early mobilization and aspirin twice daily for DVT prophylaxis. The patient received 24 hours of perioperative antibiotics. An incentive spirometer was provided and she was instructed in its use.     Pain was adequately managed with scheduled Tylenol, IV morphine, as well as oxycodone as needed. The surgical bandage was regularly inspected and found to be clean, intact, and without evidence of active drainage or infection.     Physical and Occupational Therapy evaluated the patient and determined it safe and appropriate for her to discharge home. At the time of this summary she is stable, tolerating diet, voiding spontaneously and is ready to discharge.        DISPOSITION:    01 HOME/SELF CARE [01]    CONDITION: good     CONSULTS COMPLETED:    None     OPERATIONS/PROCEDURES:  Surgical/Procedural Cases on this Admission     Case IDs Date Procedure Surgeon Location Status    [phone] 07/30/20 ARTHROPLASTY, RIGHT HIP, TOTAL Manner, Vevelyn Royals, MD [facility] MAIN OR Comp          Additional procedures:  None     PRINCIPAL DIAGNOSTIC STUDIES/RESULTS:    Imaging Results:  XR Hip Unilat  W Pelvis 1 Vw Right  Narrative: EXAMINATION:  XR HIP UNILAT  W PELVIS 1 VW RIGHT    CLINICAL INDICATION:  post op per Attending, Right hip-XL      COMPARISON:   Radiographs of the pelvis and right hip dated June 14, 2020.    FINDINGS AND   Impression: Patient is status post total right hip arthroplasty. Hardware is intact without evidence of complication.    No periprosthetic fracture. There is degeneration at the pubic symphysis and of the lower lumbar spine.    There is expected postoperative soft tissue gas about the right hip.        Discharge Orders   No driving   Order Comments: No driving while taking opioid medication     Special activity instructions (specify)   Order Comments: Absolutely no soaking or submerging the incision or swimming of any kind  until cleared by your surgeon     Weight bearing as tolerated     Weight Bearing Status Weight Bearing as Tolerated    Extremity: Right Lower Extremity    RLE Weight-bearing as tolerated  Wound care (specify)   Order Comments: Keep incision clean and dry. Monitor for any redness, swelling, drainage, or other signs of infection.     May shower   Order Comments: May shower with assistance starting 4 days after surgery with only surgical tape covering incision. Wash with gentle soap and water and pat dry.     Call / Return to the Clinic for the Following     Call / Return to Clinic for: Difficulty breathing / swallowing    Call / Return to Clinic for: Redness around incision site    Call / Return to Clinic for: Nausea or Vomiting    Call / Return to Clinic for: Temperature above 38C / 100.10F    Call / Return to Clinic for: Difficulty voiding    Call / Return to Clinic for: Rash or itching    Call / Return to Clinic for: Pain not controlled by pain medications      Regular diet     Diet type: Regular diet        DISCHARGE PHYSICAL EXAM:   Vitals (Most recent in last 24 hrs)     T: 36.8 C (07/31/20 0813)  BP: 100/65 (07/31/20 0930)  HR: 74 (07/31/20 0930)  RR: 18 (07/31/20 0813)  SpO2: 98 % (07/31/20 0813) Room air  T range: Temp  Min: 36.4 C  Max: 36.8 C  Admit weight: 79.1 kg (174 lb 6.1 oz) (07/30/20 0812)  Last weight: 79.1 kg (174 lb 6.1 oz) (07/30/20 8469)       Physical Exam  MSK: Surgical incision clean, dry, and intact. No drainage. Dressing in place.           Gross physicians mentioned in this note can be reached by calling [facility] at [phone]. If any part of this transcript is missing or to request other transcripts for this patient call [phone]. For online access to patient records enroll in [place] at [email or web address].

## 2020-08-01 ENCOUNTER — Telehealth (HOSPITAL_COMMUNITY): Payer: Self-pay | Admitting: Student in an Organized Health Care Education/Training Program

## 2020-08-01 NOTE — Telephone Encounter (Signed)
I spoke with Autumn Camacho at length this evening after she called the orthopedic surgery resident on-call regarding her right hip pain.  The patient notes that she was ambulating easily earlier today and yesterday after being discharged home from the hospital on 10/12 but then she heard a few pops in her hip and started to develop any pain in her right hip that limited her ambulation.  I tried to inquire whether the patient had a dislocation event or felt as if her leg was shorter, internally or more externally rotated than her contralateral side, of which she denied.  The patient is reporting that her postoperative block started to wear off earlier today, and now she is in a significant leak more acute pain than she was when she was first discharged from the hospital.  I encouraged the patient that if she was feeling as if her leg/hip was unstable at this time, she should come to the emergency department for further evaluation and x-rays of her hip to ensure that she had not dislocated.  The patient preferred that she would "take it easy and lay down" at home and reevaluate in a couple of hours.  There may be no dislocation event at all and this is simply just for postoperative block wearing off with increased pain.  I encouraged the patient to present to the emergency room this evening that should she develop any more pain or pain that does not abate.  The patient expressed gratitude and agreed with this plan.

## 2020-08-02 ENCOUNTER — Telehealth (INDEPENDENT_AMBULATORY_CARE_PROVIDER_SITE_OTHER): Payer: Self-pay | Admitting: Orthopaedic Surgery

## 2020-08-02 DIAGNOSIS — Z96641 Presence of right artificial hip joint: Secondary | ICD-10-CM

## 2020-08-02 DIAGNOSIS — G8918 Other acute postprocedural pain: Secondary | ICD-10-CM

## 2020-08-02 NOTE — Telephone Encounter (Signed)
Patient s/p DOS 07/30/20 R THA.    Patient requesting a refill of oxycodone. Patient states she has 20 left.    Pharmacy:  [id number] [place] [phone]   [address] [place], [place] [address]   Phone:  [phone] Fax:  [phone]     Routing to clinical staff to assist.

## 2020-08-05 ENCOUNTER — Telehealth (INDEPENDENT_AMBULATORY_CARE_PROVIDER_SITE_OTHER): Payer: Self-pay | Admitting: Physician Assistant

## 2020-08-05 ENCOUNTER — Other Ambulatory Visit (INDEPENDENT_AMBULATORY_CARE_PROVIDER_SITE_OTHER): Payer: Self-pay | Admitting: Orthopaedic Surgery

## 2020-08-05 ENCOUNTER — Encounter (INDEPENDENT_AMBULATORY_CARE_PROVIDER_SITE_OTHER): Payer: Self-pay | Admitting: Orthopaedic Surgery

## 2020-08-05 ENCOUNTER — Telehealth (HOSPITAL_COMMUNITY): Payer: Self-pay | Admitting: Student in an Organized Health Care Education/Training Program

## 2020-08-05 DIAGNOSIS — Z96641 Presence of right artificial hip joint: Secondary | ICD-10-CM

## 2020-08-05 MED ORDER — OXYCODONE HCL 5 MG OR TABS
5.0000 mg | ORAL_TABLET | ORAL | 0 refills | Status: DC | PRN
Start: 2020-08-05 — End: 2020-11-15

## 2020-08-05 NOTE — Telephone Encounter (Signed)
Front desk called patient to assist with rescheduling. Patient's second post-op appointment is rescheduled for 12/3 at 8:45 am.

## 2020-08-05 NOTE — Telephone Encounter (Signed)
I spoke with Autumn Camacho this morning at approximately 1:15 AM.  She called the after-hours line with concerns that she had a postoperative infection.  The patient reported that she was seeing some "yellowish drainage" from the proximal aspect of her incision after she took a time to take a shower today.  The patient denies any fevers or chills or systemic signs of infection.  Patient uploaded pictures to the media tab of her MyChart.  These pictures were consistent with postoperative serous drainage and normal wound healing.  I reassured the patient and she will continue to follow-up at her scheduled time.

## 2020-08-05 NOTE — Telephone Encounter (Signed)
RETURN CALL: Voicemail - Detailed Message      SUBJECT:  Cancellation/Reschedule Request     REASON: Out of town  ADDITIONAL INFORMATION: [facility] unable to reschedule per SM.

## 2020-08-06 ENCOUNTER — Encounter (HOSPITAL_BASED_OUTPATIENT_CLINIC_OR_DEPARTMENT_OTHER): Payer: Self-pay

## 2020-08-06 ENCOUNTER — Ambulatory Visit
Payer: No Typology Code available for payment source | Attending: Orthopaedic Surgery | Admitting: Rehabilitative and Restorative Service Providers"

## 2020-08-06 DIAGNOSIS — G8929 Other chronic pain: Secondary | ICD-10-CM | POA: Insufficient documentation

## 2020-08-06 DIAGNOSIS — Z96641 Presence of right artificial hip joint: Secondary | ICD-10-CM | POA: Insufficient documentation

## 2020-08-06 DIAGNOSIS — M25551 Pain in right hip: Secondary | ICD-10-CM | POA: Insufficient documentation

## 2020-08-06 DIAGNOSIS — R262 Difficulty in walking, not elsewhere classified: Secondary | ICD-10-CM | POA: Insufficient documentation

## 2020-08-06 DIAGNOSIS — Z96652 Presence of left artificial knee joint: Secondary | ICD-10-CM | POA: Insufficient documentation

## 2020-08-06 NOTE — Progress Notes (Signed)
PHYSICAL THERAPY INITIAL PLAN OF CARE            PLAN OF CARE DUE: 10/05/20  Progress note due: 09/05/20     VISITS FROM SOC:   1      INTERPRETER  STATUS (Not needed, Telephonic, In Person):not needed    Referring Provider:  Vevelyn Royals Manner     Diagnosis:     ICD-10-CM    1. Status post right hip replacement  Z96.641    2. Difficulty in walking, not elsewhere classified  R26.2         PRECAUTIONS: Active abductor muscle strengthening for the right hip; home exercise program to be done two times per day.    Gait training, weight-bearing as tolerated.     Lifelong Precautions:    1. No flexion greater than 120 degrees  2. No hyperextension and external rotation (think of winding up to kick a soccer ball)  3. No hyperflexion, internal rotation, and adduction (think of reaching over to pick up a roll of toilet paper that's fallen next to you while you're on the toilet).     Mechanism of Injury: RA    Pertinent Medical/Surgical History/Current Medications that may affect progress in PT:   Patient Active Problem List   Diagnosis   . Rheumatoid arthritis involving multiple sites ([place])   . Chronic right hip pain   . Rheumatoid arthritis ([place])   . Rheumatoid arthritis involving right hand ([place])   . 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 Osteoarthritis of right hip   . Osteoarthritis of right knee   . Knee dislocation, left, initial encounter   . Failed total left knee replacement ([place])   . Chronic instability of left knee   . Failed total knee replacement, subsequent encounter   . Status post revision of total replacement of left knee       Past Medical History:   Diagnosis Date   . Chronic right hip pain 02/26/2019   . Fracture     Bilateral elbow fx   . Rheumatoid arthritis ([place])    . Rheumatoid arthritis ([place])    . Spinal stenosis    . Tooth disorder     MISSING TOOTH UPPER LEFT BACK-MOLAR     Past Surgical History:    Procedure Laterality Date   . foot Bilateral     Arch surgery   . KNEE ARTHROPLASTY      LEFT 2018 REPAIR 2019   . KNEE ARTHROPLASTY Left 05/01/2020   . PR ADENOIDECTOMY PRIMARY <AGE 62      AGE 56  T/A    . PR ANES; TOTAL KNEE REPLACEMENT Left 04/01/2020   . PR TONSILLECTOMY ONE-HALF <AGE 62     . PR UNLISTED PROCEDURE FEMUR/KNEE Bilateral     TKA   . PR UNLISTED PROCEDURE FEMUR/KNEE      Left revision TKA    . PR UNLISTED PROCEDURE FOOT/TOES Bilateral 2009 R,  2010 L    Reconstructive surgery   . TKA Left 2019    revision from prior TKA     07/30/20 Procedure(s):  ARTHROPLASTY, RIGHT HIP, TOTAL  ____________________________________________________________________     Previous Therapy: pre surgery  Prior Level of Function:  Before onset of the current condition, the patient was able to walk without assistive device, get in and out of bed without assistance, negotiate stairs without assistance  Home Environment: 4 stairs to enter, one rail    SUBJECTIVE:  Patient's Statement: Autumn Camacho is feeling pretty good. She is still using her pain medication. She is using a walker for mobility, and a cane to negotiate her stairs.       Patient Goals:walk without assistive device, increase activity level    OBJECTIVE MEASURES / IMPAIRMENTS / ACTIVITY LIMITATIONS:  Pain: reports not having pain, but there is soreness along incision and lateral thigh/10    Gait with FWW- weight bearing through R LE , limited active hip flexion, using pelvic rotation to perform swing phase    MMT: tested in neutral hip position 4/5 hip flexion, IR, extension. 3/5 ER, abduction    ROM: hip flexion to 90  Knee flexion 110, extension 0  In supine at 90 deg flex there is approx 10 deg ER    Using goniometer reviewed what motion precautions look like  Practice sit to stand using R LE to assist vs keeping leg flexed in front with heel on floor      TREATMENT & EDUCATION    Primary Learner: Patient  Topics Taught: initial HEP instructions and  post-operative precautions  Challenges Impacting this Teaching: None  Desire and Motivation to Learn: Engaging with education, ask questions  Preferred Learning Style: self-directed practice  Post Education Response:  Demonstrates tasks independently    Patient's learning needs, abilities, preferences and readiness per Initial POC were considered in this session.  Learning verified by teach back.    Evaluation Code: 33295 for 35 minutes.  Clinical Presentation for Selection of Evaluation Code: Evolving  Clinical Decision Making Complexity for Selection of Eval Code:Moderate      Intervention:  97110 Therapeutic exercise for 25 minutes                           Seated yellow band hip abd/ER to neutral ( patient rests in slight IR) 5 sec 5x  Seated LAQ 5x  Supine glute isometric with press into table 5 sec 5x  Supine heel slides with assist 5x  Supine assisted abduction to 10 deg 10x        Time in:   0802  Total Treatment Minutes:   60   Total Timed Code Minutes:  25    Home Exercise Program   Seated yellow band hip abd/ER to neutral ( patient rests in slight IR) 5 sec 10x  Seated LAQ 10x  Supine glute isometric with press into table 5 sec 10x  Supine heel slides  10x  Supine  abduction to 10 deg 10x  Twice per day    a handout was provided describing the exercises in written and picture format    ASSESSMENT:    Nava is doing well status post right hip arthroplasty. She has expected muscle inhibition and weakness given surgery. She is tolerating her weight bearing well. She will benefit from a course of physical therapy to progress her strength, balance, and mobility.    Factors/barriers that may delay or affect course of care include: pertinent past medical history as listed above, Chronicity or severity and Comorbidities, complications, impairment, the following cultural factors: none.    Outcomes Score: LEFS 5/100      FALL RISK:    No fall risk based on screening         PLAN:   Pain will be addressed at the  next plan of care review, and intermittently during daily visits  as deemed appropriate by the provider.  The focus of daily visits will be on function.    Therapy Treatment Plan (Frequency/Duration/Other Follow up): Patient will be seen for 2 visits per week  until plan of care reviewed.      To improve impairments and reach functional goals the following interventions will be performed: Patient Education, Therapeutic Exercise [phone]) and Gait Training [phone])      TREATMENT GOALS  Anticipated Discharge Date 12/07/20  LONG TERM GOALS   (Functional Goals)  Current Level of Function /  Participation Restrictions   Patient will be able to perform community ambulation without assistive device Using FWW for all ambulation   Patient will be able to negotiate stairs to enter home without assistive device Using cane and rail for stairs        Short Term Goals To be met by MET or UNMET / Date    Patient will be able to perform household ambulation without assistive device 10/06/20    Patient will be able to perform 4" step up 5x 10/06/20    Patient will be able to perform 5x sit to stand without UE assist 09/06/20        Patient assisted in establishing goals and Patient agrees with plan.    Vanice Sarah, PT  [facility]  Phone: [phone]   FAX: [phone]

## 2020-08-08 ENCOUNTER — Encounter (INDEPENDENT_AMBULATORY_CARE_PROVIDER_SITE_OTHER): Payer: Self-pay | Admitting: Orthopaedic Surgery

## 2020-08-08 ENCOUNTER — Ambulatory Visit (INDEPENDENT_AMBULATORY_CARE_PROVIDER_SITE_OTHER): Payer: No Typology Code available for payment source | Admitting: Orthopaedic Surgery

## 2020-08-08 ENCOUNTER — Inpatient Hospital Stay (INDEPENDENT_AMBULATORY_CARE_PROVIDER_SITE_OTHER)
Admit: 2020-08-08 | Discharge: 2020-08-08 | Disposition: A | Discharge status: Home / Self Care | Payer: No Typology Code available for payment source | Source: Home / Self Care

## 2020-08-08 ENCOUNTER — Other Ambulatory Visit (INDEPENDENT_AMBULATORY_CARE_PROVIDER_SITE_OTHER): Payer: Medicare Other

## 2020-08-08 VITALS — BP 100/66 | HR 96 | Temp 98.3°F | Ht 61.0 in | Wt 179.2 lb

## 2020-08-08 DIAGNOSIS — Z96641 Presence of right artificial hip joint: Secondary | ICD-10-CM

## 2020-08-08 DIAGNOSIS — M48061 Spinal stenosis, lumbar region without neurogenic claudication: Secondary | ICD-10-CM

## 2020-08-08 MED ORDER — GABAPENTIN 300 MG OR CAPS
300.0000 mg | ORAL_CAPSULE | Freq: Three times a day (TID) | ORAL | 0 refills | Status: DC
Start: 2020-08-08 — End: 2021-04-16

## 2020-08-08 NOTE — Progress Notes (Signed)
Patient's Primary Care Physician: Terressa Koyanagi, MD    Diagnosis: S/p R THA 07/30/20    Autumn Camacho returns for follow-up today for her first post op visit after THA.    HPI:  Overall Autumn Camacho is doing well. Her pain continues to improve and she feels like she's becoming more functional. She had her first session with PT and has some questions about her precautions today. Her skin redness and blistering are improving and she denies fevers, chills, or other infectious signs. She had one instance where she was getting out of bed and felt a pop in the hip that was very concerning for her. She's since been able to walk without increased difficulty and says that her pain wasn't really affected by the incident.     ROS:  Autumn Camacho reports no tingling, numbness, or weakness in the affected extremity.    EXAM:    On physical exam, looks her stated age.  Autumn Camacho is oriented to time, place, and person.  Her gait is mildly antalgic.     Exam of the right lower extremity: Skin is healing - there are unroofed blisters are around the proximal aspect of the incision that are getting better. There is also diffuse redness that looks consistent with a mild dermatitis. She is able to range the hip without much difficulty from 0-90 and weight bears without issue.     Skin temperature and turgor are normal throughout the lower extremities, without swelling, varicosities, or lymphadenopathy.    IMAGING:   XR Of the right hip is notable for a [place] A periprosthetic fracture of the greater trochanter. The implant remains well-fixed with no change in alignment relative to her immediate post operative x-rays. The joint is reduced and there are no fractures.    IMPRESSION:  61F 9 days s/p R THA with Dr. Catalina Gravel who sustained a [place] A periprosthetic fracture of the greater trochanter and has some dermatitis, likely from her dressings.     PLAN:  - We discussed her fracture and explained that fortunately the implant remains  well fixed through the weight-bearing portion of the femoral component.   - Weight bearing as tolerated right lower extremity   - No active abduction until we see her back in clinic   - Continue working with PT on gait training and strengthening (excluding abduction exercises)   - Pr[place] dressing down in shower 2 weeks post op   - Antibiotic prophylaxis for invasive dental procedures     Follow-up will be 12/3 with XR of the right hip.      Westley Hummer, MD  Resident Physician  [facility] Department of Orthopaedics and Sports Medicine

## 2020-08-08 NOTE — Progress Notes (Signed)
Please see the above note by the orthopaedic resident.  I was present with the resident during both the history and physical examination and the results described are corroborated by my own findings.  I discussed the case with the resident, and agree with the findings and plan as documented in that note.      Zadia Uhde, MD  FRCSC  Hip and Knee Arthritis  Professor  Department of Orthopaedics and Sports Medicine  [place] [facility]

## 2020-08-08 NOTE — Progress Notes (Deleted)
PHYSICAL THERAPY TREATMENT NOTE  __________________________________________________________________    VISITS FROM SOC:  Visit count could not be calculated. Make sure you are using a visit which is associated with an episode.    INTERPRETER  STATUS (Not needed, Telephonic, In Person):not needed    Referring Provider:  Vevelyn Royals Manner     Diagnosis:     ICD-10-CM    1. Status post right hip replacement  Z96.641    2. Difficulty in walking, not elsewhere classified  R26.2         PRECAUTIONS: Active abductor muscle strengthening for the right hip; home exercise program to be done two times per day.    Gait training, weight-bearing as tolerated.     Lifelong Precautions:    1. No flexion greater than 120 degrees  2. No hyperextension and external rotation (think of winding up to kick a soccer ball)  3. No hyperflexion, internal rotation, and adduction (think of reaching over to pick up a roll of toilet paper that's fallen next to you while you're on the toilet).    Mechanism of Injury: RA    SUBJECTIVE:  Patient's Statement: ***  How patient is doing + pain medication time    OBJECTIVE MEASURES/IMPAIRMENTS:  ***  Possible gait assessment with assistive device   TREATMENT/SKILLED INTERVENTION & EDUCATION:  Intervention 1:   {PT INTERVENTIONS/SKILLED SERVICES:500251371}                      Seated yellow band hip abd/ER to neutral ( patient rests in slight IR) 5 sec 5x  Seated LAQ 5x - potential progression to 8 reps  Supine glute isometric with press into table 5 sec 5x - potential increase rep or sidelying clamshells  Supine heel slides with assist 5x -- 10x   Supine assisted abduction to 10 deg 10x    Intervention 2:  {PT INTERVENTIONS/SKILLED SERVICES:500251371}                                 Intervention 3:  {PT INTERVENTIONS/SKILLED SERVICES:500251371}       Patient's learning needs, abilities, preferences and readiness per Initial POC were considered in this session.  Learning verified by teach  back.    Time in: ***  Total Treatment Minutes:   ***  Total Timed Code Minutes:  ***      HOME EXERCISE PROGRAM (HEP):   ***  Seated yellow band hip abd/ER to neutral ( patient rests in slight IR) 5 sec 10x  Seated LAQ 10x  Supine glute isometric with press into table 5 sec 10x  Supine heel slides  10x  Supine abduction to 10 deg 10x  Twice per day    {PT EDUCATION ([place]/ETC):105852}     Assessment / Patient Response to Therapy:   ***    PLAN:    Continue per treatment plan of care.  Pain will be addressed at the next plan of care review, and intermittently during daily visits as deemed appropriate by the provider.  The focus of daily visits will be on function    Plan for next visit: ***    H174 PT Team 1  [facility]  [facility]  Phone: [phone]   FAX: [phone]

## 2020-08-09 ENCOUNTER — Telehealth (INDEPENDENT_AMBULATORY_CARE_PROVIDER_SITE_OTHER): Payer: Self-pay | Admitting: Orthopaedic Surgery

## 2020-08-09 ENCOUNTER — Ambulatory Visit (HOSPITAL_BASED_OUTPATIENT_CLINIC_OR_DEPARTMENT_OTHER): Payer: No Typology Code available for payment source | Admitting: Rehabilitative and Restorative Service Providers"

## 2020-08-09 DIAGNOSIS — R262 Difficulty in walking, not elsewhere classified: Secondary | ICD-10-CM

## 2020-08-09 DIAGNOSIS — Z96641 Presence of right artificial hip joint: Secondary | ICD-10-CM

## 2020-08-09 DIAGNOSIS — G8918 Other acute postprocedural pain: Secondary | ICD-10-CM

## 2020-08-09 MED ORDER — OXYCODONE HCL 5 MG OR TABS
5.0000 mg | ORAL_TABLET | Freq: Four times a day (QID) | ORAL | 0 refills | Status: DC | PRN
Start: 2020-08-11 — End: 2020-08-20

## 2020-08-09 NOTE — Telephone Encounter (Signed)
Patient s/p DOS 07/30/20 R THA.    Patient requesting a refill of oxycodone. Patient states she has 30 left.    Pharmacy:  [facility] #[id number] [place] [phone]   [address], [place] [address]   Phone:  [phone] Fax:  [phone]     Routing to clinical staff to assist.

## 2020-08-09 NOTE — Progress Notes (Signed)
PHYSICAL THERAPY TREATMENT NOTE  __________________________________________________________________    VISITS FROM SOC:  2    INTERPRETER  STATUS (Not needed, Telephonic, In Person):not needed    Referring Provider:  Vevelyn Royals Manner     Diagnosis:     ICD-10-CM    1. Status post right hip replacement  Z96.641    2. Difficulty in walking, not elsewhere classified  R26.2         PRECAUTIONS: Active abductor muscle strengthening for the right hip; home exercise program to be done two times per day.- THIS HAS CHANGED. Per last MD visit 08/08/20 , "No active abduction until we see her back in clinic   - Continue working with PT on gait training and strengthening (excluding abduction exercises)     Gait training, weight-bearing as tolerated.     Lifelong Precautions:    1. No flexion greater than 120 degrees  2. No hyperextension and external rotation (think of winding up to kick a soccer ball)  3. No hyperflexion, internal rotation, and adduction (think of reaching over to pick up a roll of toilet paper that's fallen next to you while you're on the toilet).    Mechanism of Injury: RA  08/08/20 imaging: IMAGING:   XR Of the right hip is notable for a [place] A periprosthetic fracture of the greater trochanter. The implant remains well-fixed with no change in alignment relative to her immediate post operative x-rays. The joint is reduced and there are no fractures.    SUBJECTIVE:  Patient's Statement: Autumn Camacho reports taking pain medication at [facility] prior to her appointment and has transitioned to using a cane instead of her walker. She states that she has increased pain from walking with the cane from the car to the clinic.  She decided to switch back to the walker following physical therapy.      OBJECTIVE MEASURES/IMPAIRMENTS:        TREATMENT/SKILLED INTERVENTION & EDUCATION:  Intervention 1:   97110 Therapeutic exercise for 50 minutes                      Seated LAQ 5x   Supine glute isometric with press  into table 5sec 5x  Supine heel slides with assist 5x   Ankle pumps with blue band -- 10x   Weight shift gait training - 10s 10x  Nu Step lvl1 -- 3 min; patient able to tolerate Nu step with no increase in symptoms     Patient's learning needs, abilities, preferences and readiness per Initial POC were considered in this session.  Learning verified by teach back.    Time in: 1500  Total Treatment Minutes:   50  Total Timed Code Minutes:  [place] (HEP):   Seated LAQ 10x  Supine glute isometric with press into table 5 sec 10x  Supine heel slides  10x  Twice per day    verbal instructions and demonstrations were provided for the above exercises     Assessment / Patient Response to Therapy:   [place]'s gait with her cane showed decreased stance phase in her right leg and her cane needed a height adjustment for optimal support.  Patient was advised to use the walker for longer distances to protect the surgery and to improve gait pattern.  Patient tolerated weight shifts on her right leg and did not have increase in symptoms.  Per post-surgical protocol, patient will withhold from abduction strengthening exercises until follow up  appointment with the surgeon to clear [facility] A periprosthetic fracture of greater trochanter.    PLAN:    Continue per treatment plan of care.  Pain will be addressed at the next plan of care review, and intermittently during daily visits as deemed appropriate by the provider. The focus of daily visits will be on function.    Plan for next visit: Continuation of ROM and strengthening while maintaining precautions.      Gretta Cool, SPT  [facility] Outpatient Physical and [facility]  Phone: [phone]   FAX: [phone]  I have reviewed the documentation of this visit and I confirm that I was present for the entire session and not engaged in other tasks.  I directed / guided the services and am responsible for the assessment and treatment of  this patient in this visit.    Roney Mans, PT  6 [facility] Outpatient PT

## 2020-08-11 NOTE — Progress Notes (Signed)
PHYSICAL THERAPY TREATMENT NOTE  __________________________________________________________________    PLAN OF CARE DUE: 10/05/20  Progress note due: 09/05/20     VISITS FROM SOC:  Visit count could not be calculated. Make sure you are using a visit which is associated with an episode.    INTERPRETER  STATUS (Not needed, Telephonic, In Person):not needed    Referring Provider:  Vevelyn Royals Manner     Diagnosis:     ICD-10-CM    1. Status post right hip replacement  Z96.641    2. Difficulty in walking, not elsewhere classified  R26.2         PRECAUTIONS: Per post-surgical protocol, patient will withhold from abduction strengthening exercises until follow up 09/20/20 appointment with the surgeon to clear [facility] A periprosthetic fracture of greater trochanter.      Gait training, weight-bearing as tolerated.     Lifelong Precautions:    1. No flexion greater than 120 degrees  2. No hyperextension and external rotation (think of winding up to kick a soccer ball)  3. No hyperflexion, internal rotation, and adduction (think of reaching over to pick up a roll of toilet paper that's fallen next to you while you're on the toilet).    Mechanism of Injury: RA    SUBJECTIVE:  Patient's Statement: Continues to take meds and uses cold pack for pain as needed.  Ambulation is primarily with walker since last visit with primary therapist. Has 2 steps to get up into house, uses [facility] and handrail with spouse nearby. Woke up this morning feeling better than she ever has since surgery. Gilford Rile helps with more support and feels that her walking is more normal. Changes gauze everyday, notes that area is scabbing over.     OBJECTIVE MEASURES/IMPAIRMENTS:  2/10 this morning.       TREATMENT/SKILLED INTERVENTION & EDUCATION:  Intervention 1:   97110 Therapeutic exercise for 40 minutes                      Reviewed below:   Seated LAQ 10x, patient seated at edge of plinth with feet on floor.    Supine glute isometric with press into  table 5sec 5x  Supine heel slides with assist 5x   Ankle pumps with blue band - 10x   Weight shift gait training - 10s 10x, patient within FWW.   Nu Step L1  - x6 min; patient able to tolerate Nu step with no increase in symptoms  FWW adjustment (up 1 higher) - improved posture, less fwd lean after adjustment.        Patient education: patient to trial HEP 2x/day, getting up and walking at home every hour, but to listen to body! Continue to use FWW.        Patient's learning needs, abilities, preferences and readiness per Initial POC were considered in this session.  Learning verified by teach back.    Time in: 0815  Total Treatment Minutes:   40  Total Timed Code Minutes:  [place] (HEP):   Seated LAQ 10x, 5" hold   Supine glute isometric with press into table 5 sec 10x  Supine heel slides 10x  Twice per day    Ankle DF with blue bands 3x10     verbal instructions and demonstrations were provided for the above exercises  the exercises that were provided last session were reviewed with the patient today  the patient was able to return demonstrate  the exercises accurately after instructions     Assessment / Patient Response to Therapy:   The patient tolerated session well today. She presented to physical therapy today using FWW with step thru gait. Emphasis on treatment today was focused review of HEP/precautions. Noted slight forward posture with FWW, adjusted 1 notch higher - improved posture after adjustment. Noted no discomfort throughout treatment today. Patient is to try HEP 2x/day and to get up/move every hour, as tolerated. The patient will continue benefit from skilled therapy to work on ROM and strengthening and work towards functional goals.       PLAN:    Continue per treatment plan of care.  Pain will be addressed at the next plan of care review, and intermittently during daily visits as deemed appropriate by the provider. The focus of daily visits will be on function.    Plan for  next visit: Continuation of ROM and strengthening while maintaining precautions.      Manning Charity, PTA  [facility] Outpatient Physical and [facility]  Phone: [phone]   FAX: [phone]

## 2020-08-12 ENCOUNTER — Ambulatory Visit (HOSPITAL_BASED_OUTPATIENT_CLINIC_OR_DEPARTMENT_OTHER): Payer: No Typology Code available for payment source | Admitting: Rehabilitative and Restorative Service Providers"

## 2020-08-12 DIAGNOSIS — Z96641 Presence of right artificial hip joint: Secondary | ICD-10-CM

## 2020-08-12 DIAGNOSIS — R262 Difficulty in walking, not elsewhere classified: Secondary | ICD-10-CM

## 2020-08-12 NOTE — Telephone Encounter (Signed)
Spoke with patient and informed that requested Oxycodone medication has been approved and sent to preferred pharmacy [facility] in [place]. Advised patient to call pharmacy to know when would it be ready for pick up. Patient expressed appreciation of the call.

## 2020-08-14 ENCOUNTER — Ambulatory Visit (HOSPITAL_BASED_OUTPATIENT_CLINIC_OR_DEPARTMENT_OTHER): Payer: No Typology Code available for payment source | Admitting: Rehabilitative and Restorative Service Providers"

## 2020-08-14 DIAGNOSIS — Z96641 Presence of right artificial hip joint: Secondary | ICD-10-CM

## 2020-08-14 DIAGNOSIS — R262 Difficulty in walking, not elsewhere classified: Secondary | ICD-10-CM

## 2020-08-14 NOTE — Progress Notes (Signed)
PHYSICAL THERAPY TREATMENT NOTE  __________________________________________________________________    VISITS FROM SOC:  4    INTERPRETER STATUS (Not needed, Telephonic, In Person):not needed  Referring Provider: Vevelyn Royals Manner     Diagnosis:              ICD-10-CM    1. Status post right hip replacement  Z96.641     2. Difficulty in walking, not elsewhere classified  R26.2         PRECAUTIONS:Active abductor muscle strengthening for the right hip; home exercise program to be done two times per day.- THIS HAS CHANGED. Per last MD visit 08/08/20 , "No active abduction until we see her back in clinic   - Continue working with PT on gait training and strengthening (excluding abduction exercises)     Gait training, weight-bearing as tolerated.    Lifelong Precautions:    1. No flexion greater than 120 degrees  2. No hyperextension and external rotation (think of winding up to kick a soccer ball)  3. No hyperflexion, internal rotation, and adduction (think of reaching over to pick up a roll of toilet paper that's fallen next to you while you're on the toilet).        SUBJECTIVE:  Patient's Statement: Autumn Camacho continues to note improving symptoms. She really likes how she feels with the nu step.     OBJECTIVE MEASURES/IMPAIRMENTS:      TREATMENT/SKILLED INTERVENTION & EDUCATION:  Intervention 1:   97110 Therapeutic exercise for 40 minutes                    Nu step level 2 15 minutes, LE only  In // bar with light touch support:  Standing weight shift forward/backward - focus on keeping pelvis stable and moving through hips 10 sec hold 10x R/L  Gait practice in // bar working on hip flexion to initiate swing vs pelvic rotation  2" tap alternate LE with L UE support ( trial of step up too painful R lateral hip- not continued)        Patient's learning needs, abilities, preferences and readiness per Initial POC were considered in this session.  Learning verified by teach back.    Time in: 1400  Total  Treatment Minutes:   40  Total Timed Code Minutes:  [place] (HEP):   Seated LAQ 10x, 5" hold   Supine glute isometric with press into table 5 sec 10x  Supine heel slides 10x  Twice per day    Ankle DF with blue bands 3x10         Assessment / Patient Response to Therapy:   Autumn Camacho is doing better with use of walker versus cane in order to keep better alignment through pelvis in frontal plane. She responds well to low impact aerobic conditioning.    PLAN:    Continue per treatment plan of care.  Pain will be addressed at the next plan of care review, and intermittently during daily visits as deemed appropriate by the provider.  The focus of daily visits will be on function    Plan for next visit: as above    Vanice Sarah, PT  [facility] Outpatient Physical and [facility]  Phone: [phone]   FAX: [phone]

## 2020-08-16 ENCOUNTER — Telehealth (INDEPENDENT_AMBULATORY_CARE_PROVIDER_SITE_OTHER): Payer: Self-pay | Admitting: Orthopaedic Surgery

## 2020-08-16 NOTE — Telephone Encounter (Signed)
DOS 07/30/20 R THA    Patient called stating she has been having sharp pain in the joint.  Patient stated while she is laying or sitting she has stabs of pain, but while she put weight on the right hip while walking or standing she gets the sharp pain.    Patient stated she has decreased her pain medication.  She only take one 5mg  of Oxycodone 2x a day and one 1000mg  of Tylenol 3x a day.      Patient would like to know if this normal or a reason for concern.    Routing to the clinical staff to assist.

## 2020-08-16 NOTE — Telephone Encounter (Signed)
Called patient, states pain has increased now that she has been up and moving more. Patient states during last visit on 10/21, they noted a fracture but implant remains intact. Per Dr. Catalina Gravel, continue WBAT and working with PT on gait training and strengthening. Patient states her pain is achy to "three points" in her hip that are similar to prior to surgery. Also states it felt more muscular, but pain has improved since before seeing Dr. Catalina Gravel. Patient asked "am I just overdoing it?" let patient know that pain can increase due to fracture, she is only 3 weeks post op and only taking 5mg  oxycodone 2x day. Let her know OK to increase frequency of oxycodone as needed if pain is not tolerable with just BID. Patient states "I think I'm pushing myself too hard". Let patient know to try increasing oxycodone through the weekend, continue ice and elevation. Let patient know to callback if her pain continues to be severe and does not improve with increase medication. Patient stated understanding and appreciated call back

## 2020-08-20 ENCOUNTER — Ambulatory Visit
Payer: No Typology Code available for payment source | Attending: Orthopaedic Surgery | Admitting: Rehabilitative and Restorative Service Providers"

## 2020-08-20 ENCOUNTER — Telehealth (INDEPENDENT_AMBULATORY_CARE_PROVIDER_SITE_OTHER): Payer: Self-pay | Admitting: Orthopaedic Surgery

## 2020-08-20 DIAGNOSIS — R262 Difficulty in walking, not elsewhere classified: Secondary | ICD-10-CM | POA: Insufficient documentation

## 2020-08-20 DIAGNOSIS — Z96641 Presence of right artificial hip joint: Secondary | ICD-10-CM | POA: Insufficient documentation

## 2020-08-20 DIAGNOSIS — Z96652 Presence of left artificial knee joint: Secondary | ICD-10-CM | POA: Insufficient documentation

## 2020-08-20 DIAGNOSIS — G8918 Other acute postprocedural pain: Secondary | ICD-10-CM

## 2020-08-20 DIAGNOSIS — M25551 Pain in right hip: Secondary | ICD-10-CM | POA: Insufficient documentation

## 2020-08-20 DIAGNOSIS — G8929 Other chronic pain: Secondary | ICD-10-CM | POA: Insufficient documentation

## 2020-08-20 MED ORDER — OXYCODONE HCL 5 MG OR TABS
5.0000 mg | ORAL_TABLET | Freq: Four times a day (QID) | ORAL | 0 refills | Status: DC | PRN
Start: 2020-08-20 — End: 2020-11-15

## 2020-08-20 NOTE — Progress Notes (Addendum)
PHYSICAL THERAPY TREATMENT NOTE  __________________________________________________________________    VISITS FROM SOC:  5    INTERPRETER STATUS (Not needed, Telephonic, In Person):not needed  Referring Provider: Vevelyn Royals Manner     Diagnosis:              ICD-10-CM    1. Status post right hip replacement  Z96.641     2. Difficulty in walking, not elsewhere classified  R26.2         PRECAUTIONS:Active abductor muscle strengthening for the right hip; home exercise program to be done two times per day.- THIS HAS CHANGED. Per last MD visit 08/08/20 , "No active abduction until we see her back in clinic   - Continue working with PT on gait training and strengthening (excluding abduction exercises)    Gait training, weight-bearing as tolerated.    Lifelong Precautions:    1. No flexion greater than 120 degrees  2. No hyperextension and external rotation (think of winding up to kick a soccer ball)  3. No hyperflexion, internal rotation, and adduction (think of reaching over to pick up a roll of toilet paper that's fallen next to you while you're on the toilet).    SUBJECTIVE:  Patient's Statement: She reports that last week was bad in terms of her R hip pain.  But then last weekend, she felt that she turned a corner. She continues her exercises as instructed.          OBJECTIVE MEASURES/IMPAIRMENTS:  N/T    TREATMENT/SKILLED INTERVENTION & EDUCATION:    Intervention 1:   97110 Therapeutic exercise for 40 minutes     Nu step x 5 min BLE and LE    // bars:  Marching in the place with BUE support  B heel raises with BUE support    Standing weight shift forward/backward - focus on keeping pelvis stable and moving through hips + engaging core 10 sec hold 10x R/L    Gait practice in // bar working on hip flexion to initiate swing vs pelvic rotation  Gait practice RLE IC<-> mid stance(5 sec hold)  Gait practice RLE Mid stance<->terminal stance with unlocking knee +inifiate hip flex      2"->4''  toe tap alternate LE with L UE support     Seated LAQ 10x, 5" hold  Seated glute isometrics  Seated gentle pelvic tilt  Seated ankle AROM    Care coordination--she reported that she continues to feel sharp pain in her right hip mainly at 3 places:R groin, greater troch and ischial tuberosity.  It happens when she tries to stand up. Movement helps to ease the sharp pain.  She worries about the slow recovery with residual pain and risk of dislocating her R hip. We discussed and reviewed her hip precautions.  Due to her complex medical history, her recovery may take more time for her joint and soft tissue to be ready for the next phase.  Advised to give herself "warm up time" at transitional movement.      Intervention 2:  .                                 Intervention 3:  .       Patient's learning needs, abilities, preferences and readiness per Initial POC were considered in this session.  Learning verified by teach back.    Time in: 0805  Total Treatment Minutes:   40  Total  Timed Code Minutes:  [place] (HEP):   Seated LAQ 10x, 5" hold  Supine glute isometric with press into table 5 sec 10x  Supine heel slides 10x  Twice per day    Ankle DF with blue bands 3x10  .     Assessment / Patient Response to Therapy:   Autumn Camacho demonstrates good focus and effort improving her right lower extremity strength and functional mobility by following current hip precautions. She continues to respond well to low impact aerobic conditioning and weight shifting exercises to improve her postural endurance and control.  Her right hip pain persists, although gentle repeated motion seems easing her initial discomfort and anxiety. She will continue benefit from skilled therapy to work on ROM and strengthening and work towards functional goals.      PLAN:    Continue per treatment plan of care.  Pain will be addressed at the next plan of care review, and intermittently during daily visits as deemed appropriate by the  provider.  The focus of daily visits will be on function    Plan for next visit:   Cont nu step  Continuation of ROM and strengthening while maintaining precautions      Doyce Loose, PTA  [facility] and [facility]  [facility]  Phone: [phone]   FAX: [phone]

## 2020-08-20 NOTE — Telephone Encounter (Signed)
Patient s/p DOS 07/30/20 R THA.    Patient requesting a refill of oxycodone. Patient states she has 12 left, and is taking 4 a day.    Pharmacy:  [facility] #[id number] [place] [phone]   [address] [place], [place] [address]   Phone:  [phone] Fax:  [phone]     Routing to clinical staff to assist.

## 2020-08-20 NOTE — Telephone Encounter (Signed)
Spoke with patient and informed that requested Oxycodone medication has been approved and sent to preferred pharmacy [facility] [place] [phone]. Advised patient to call pharmacy to know when would it be ready for pick up. Patient expressed appreciation of the call.

## 2020-08-22 ENCOUNTER — Telehealth (INDEPENDENT_AMBULATORY_CARE_PROVIDER_SITE_OTHER): Payer: Self-pay | Admitting: Orthopaedic Surgery

## 2020-08-22 ENCOUNTER — Ambulatory Visit (HOSPITAL_BASED_OUTPATIENT_CLINIC_OR_DEPARTMENT_OTHER): Payer: No Typology Code available for payment source | Admitting: Rehabilitative and Restorative Service Providers"

## 2020-08-22 DIAGNOSIS — Z96641 Presence of right artificial hip joint: Secondary | ICD-10-CM

## 2020-08-22 DIAGNOSIS — R262 Difficulty in walking, not elsewhere classified: Secondary | ICD-10-CM

## 2020-08-22 DIAGNOSIS — G8918 Other acute postprocedural pain: Secondary | ICD-10-CM

## 2020-08-22 MED ORDER — OXYCODONE HCL 5 MG OR TABS
5.0000 mg | ORAL_TABLET | Freq: Four times a day (QID) | ORAL | 0 refills | Status: DC | PRN
Start: 2020-08-22 — End: 2020-11-15

## 2020-08-22 NOTE — Telephone Encounter (Signed)
DOS 07/30/20 R THA    Patient called stating she called in for a prescription refill for Oxycodone on 08/20/20.  Patient stated the [facility] called her and informed that they are out of the medication and will not get it in til 09/15/20 and patient is out of the medication.  Patient requesting the prescription to another pharmacy.    Patient called back and talked to [place].  Patient had called [facility] and told they have a limited supply and they cannot hold it.  Patient is requesting to send the prescription there as soon as possible.    Pharmacy:    [facility]  [address]  [place], [place]    Routing to the clinical staff to assist.

## 2020-08-22 NOTE — Progress Notes (Signed)
PHYSICAL THERAPY TREATMENT NOTE  __________________________________________________________________    VISITS FROM SOC:  6    INTERPRETER STATUS (Not needed, Telephonic, In Person):not needed  Referring Provider: Vevelyn Royals Manner     Diagnosis:              ICD-10-CM    1. Status post right hip replacement  Z96.641     2. Difficulty in walking, not elsewhere classified  R26.2         PRECAUTIONS:Active abductor muscle strengthening for the right hip; home exercise program to be done two times per day.- THIS HAS CHANGED. Per last MD visit 08/08/20 , "No active abduction until we see her back in clinic   - Continue working with PT on gait training and strengthening (excluding abduction exercises)    Gait training, weight-bearing as tolerated.    Lifelong Precautions:    1. No flexion greater than 120 degrees  2. No hyperextension and external rotation (think of winding up to kick a soccer ball)  3. No hyperflexion, internal rotation, and adduction (think of reaching over to pick up a roll of toilet paper that's fallen next to you while you're on the toilet).        SUBJECTIVE:  Patient's Statement: Autumn Camacho reports ongoing concern about sharp pain she can experience in her hip. The pain happens when she is going to sit down or get up, and seems to be worse after her PT sessions. She did get a new toilet yesterday which should be helpful as the previous one was quite small.     OBJECTIVE MEASURES/IMPAIRMENTS:  Pain 3-4/10 on non PT days. 8/10 after last PT session  Feels good in walk stance position with R leg back- stretch anterior    TREATMENT/SKILLED INTERVENTION & EDUCATION:  Intervention 1:   97110 Therapeutic exercise for 28 minutes                    Nu step level 1 7 minutes ( discontinued due to increase in hip pain)  Seated LAQ with 5lb 10 sec hold 10x  Standing with walker hamstring curl 5lb weight 10x      Patient's learning needs, abilities, preferences and readiness per Initial  POC were considered in this session.  Learning verified by teach back.    Time in: 0900  Total Treatment Minutes:   28  Total Timed Code Minutes:  [place] (HEP):   Seated LAQ  Standing hamstring curl  Standing walk stance with walker    the patient was able to return demonstrate the exercises accurately after instructions     Assessment / Patient Response to Therapy:   Patient is frustrated with her progress. She may need more time for healing prior to progressing activity. Will see how she responded to modifications made today, and continue focus on strengthening as able.    PLAN:    Continue per treatment plan of care.  Pain will be addressed at the next plan of care review, and intermittently during daily visits as deemed appropriate by the provider.  The focus of daily visits will be on function    Plan for next visit: LAQ 5lb, standing hamstring 5lb, nu step if not painful    Vanice Sarah, PT  [facility] Outpatient Physical and [facility]  Phone: [phone]   FAX: [phone]

## 2020-08-22 NOTE — Telephone Encounter (Signed)
Spoke with patient and informed that requested Oxycodone medication has been approved and sent to preferred pharmacy [facility] [phone] [place] [phone] . Advised patient to call pharmacy to know when would it be ready for pick up. Patient expressed appreciation of the call.

## 2020-08-22 NOTE — Telephone Encounter (Signed)
Previous Oxycodone sent to [facility] on 08/20/2020 was not dispensed ~ I have confirmed this directly from the pharmacist at [facility].    New Rx routed to Dr. Doristine Church to sign.

## 2020-08-26 ENCOUNTER — Encounter (HOSPITAL_BASED_OUTPATIENT_CLINIC_OR_DEPARTMENT_OTHER): Payer: No Typology Code available for payment source | Admitting: Rehabilitative and Restorative Service Providers"

## 2020-08-28 ENCOUNTER — Encounter (HOSPITAL_BASED_OUTPATIENT_CLINIC_OR_DEPARTMENT_OTHER): Payer: No Typology Code available for payment source | Admitting: Rehabilitative and Restorative Service Providers"

## 2020-09-03 ENCOUNTER — Encounter (INDEPENDENT_AMBULATORY_CARE_PROVIDER_SITE_OTHER): Payer: Self-pay | Admitting: Orthopaedic Surgery

## 2020-09-03 ENCOUNTER — Telehealth (INDEPENDENT_AMBULATORY_CARE_PROVIDER_SITE_OTHER): Payer: Self-pay | Admitting: Orthopaedic Surgery

## 2020-09-03 DIAGNOSIS — Z96641 Presence of right artificial hip joint: Secondary | ICD-10-CM

## 2020-09-03 DIAGNOSIS — G8918 Other acute postprocedural pain: Secondary | ICD-10-CM

## 2020-09-03 MED ORDER — OXYCODONE HCL 5 MG OR TABS
5.0000 mg | ORAL_TABLET | Freq: Three times a day (TID) | ORAL | 0 refills | Status: DC | PRN
Start: 2020-09-06 — End: 2020-11-15

## 2020-09-03 NOTE — Telephone Encounter (Signed)
Open in Error.

## 2020-09-03 NOTE — Telephone Encounter (Signed)
Called patient but no answer. Once patient calls back, please let her know requested medication has been signed and approved to be dispensed on Friday 09/06/2020 at [facility] #[id number] [place] [phone]

## 2020-09-03 NOTE — Telephone Encounter (Signed)
DOS 07/30/20 R THA    Patient called requesting prescription refill for Oxycodone.  Patient stated she has about 3 days of medications.    Preferred pharmacy:    [facility] [phone] [place] [phone]     Routing to the clinical staff to assist.

## 2020-09-11 ENCOUNTER — Encounter (INDEPENDENT_AMBULATORY_CARE_PROVIDER_SITE_OTHER): Payer: Medicare Other | Admitting: Physician Assistant

## 2020-09-20 ENCOUNTER — Inpatient Hospital Stay (INDEPENDENT_AMBULATORY_CARE_PROVIDER_SITE_OTHER)
Admit: 2020-09-20 | Discharge: 2020-09-20 | Disposition: A | Discharge status: Home / Self Care | Payer: No Typology Code available for payment source | Source: Home / Self Care

## 2020-09-20 ENCOUNTER — Other Ambulatory Visit (INDEPENDENT_AMBULATORY_CARE_PROVIDER_SITE_OTHER): Payer: No Typology Code available for payment source

## 2020-09-20 ENCOUNTER — Ambulatory Visit (INDEPENDENT_AMBULATORY_CARE_PROVIDER_SITE_OTHER): Payer: Medicare Other | Admitting: Orthopaedic Surgery

## 2020-09-20 ENCOUNTER — Encounter (INDEPENDENT_AMBULATORY_CARE_PROVIDER_SITE_OTHER): Payer: Self-pay | Admitting: Orthopaedic Surgery

## 2020-09-20 VITALS — BP 108/70 | HR 88

## 2020-09-20 DIAGNOSIS — Z96641 Presence of right artificial hip joint: Secondary | ICD-10-CM

## 2020-09-20 NOTE — Progress Notes (Signed)
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    Autumn Camacho returns for follow-up today for the R THA.    HPI:    She is doing quite well in the post-operative setting. Her post-operative course was complicated by a [place] A greater trochanter fracture, but this has not caused any pain or functional limitation. At her last appointment we instructed her to avoid any active abduction of the right hip which she has been compliant with.  Her pain is well controlled, and her incisional tenderness is improving without an increase in redness or swelling.  She is ambulating well without as assistive device.    ROS:  The patient reports no tingling or numbness in the affected extremity.    EXAM:  [name] is oriented to time, place, and person.  Her gait is normal with no obvious trendelenberg gait.    Right hip: Incision is well healed, without drainage, erythema, or excessive warmth.  ROM 0-90 degrees flexion.       X-rays: Well aligned, well fixed R total hip arthroplasty (hybrid). Greater trochanter fragment appears stable from previous films. No new fractures or bony abnormalities.    IMPRESSION:    Doing well 6 weeks s/p R total hip arthroplasty (hybrid). We are very pleased with the progress she is making in the post-operative setting. We provided the patient with another physical therapy referral and will now allow her to actively abduct her right hip. We plan to see her again in 8 weeks for repeat evaluation.    (G99.242) Status post right hip replacement  (primary encounter diagnosis)    PLAN:   Antibiotic prophylaxis for dental work and invasive procedures such as colonoscopy were reviewed, as was the importance of routine radiographic surveillance of joint replacements.   Follow-up will be in 8 weeks with x-rays. She is always welcome to come back sooner if her condition changes.   Physical therapy- Pt will continue to avoid heavy  lifting or high impact activities for another two weeks. She may now engage in active abduction. She can then begin to work back in her normal activities. She can continue activities such as stationary bike, elliptical machine and swimming. Pt reminded of the importance of lifelong exercise to keep the muscles of the leg strong   Pt reminded of the signs of infection and will contact us right away if any develop.    Pt reminded that she will be actively healing for around 6 months and overall recovery is 9-12 months.  When she is getting sore, she has likely over done it and needs to back off her activities.  Ice, rest and elevation are appropriate.    Raylene Miyamoto, M.D.  Resident Physician  [facility] [place]

## 2020-09-20 NOTE — Progress Notes (Signed)
Please see the above note by the orthopaedic resident.  Because of new onset respiratory symptoms, I was not physically present in the clinic.   I discussed the case with the resident, and agree with the findings and plan as documented in that note.    Rondall Allegra, MD  [facility]  Hip and Knee Arthritis  Professor  Department of Orthopaedics and [place] [facility] [place]

## 2020-10-04 ENCOUNTER — Ambulatory Visit
Payer: No Typology Code available for payment source | Attending: Physician Assistant | Admitting: Rehabilitative and Restorative Service Providers"

## 2020-10-04 DIAGNOSIS — G8929 Other chronic pain: Secondary | ICD-10-CM | POA: Insufficient documentation

## 2020-10-04 DIAGNOSIS — R262 Difficulty in walking, not elsewhere classified: Secondary | ICD-10-CM | POA: Insufficient documentation

## 2020-10-04 DIAGNOSIS — Z96652 Presence of left artificial knee joint: Secondary | ICD-10-CM | POA: Insufficient documentation

## 2020-10-04 DIAGNOSIS — Z96641 Presence of right artificial hip joint: Secondary | ICD-10-CM | POA: Insufficient documentation

## 2020-10-04 DIAGNOSIS — M25551 Pain in right hip: Secondary | ICD-10-CM | POA: Insufficient documentation

## 2020-10-04 NOTE — Progress Notes (Signed)
PHYSICAL THERAPY PLAN OF CARE REVIEW NOTE  __________________________________________________________________    VISITS FROM SOC:  7    INTERPRETER STATUS (Not needed, Telephonic, In Person):not needed  Referring Provider: Vevelyn Royals Manner     Diagnosis:              ICD-10-CM    1. Status post right hip replacement  Z96.641     2. Difficulty in walking, not elsewhere classified  R26.2         PRECAUTIONS:able to begin abduction strengthening      SUBJECTIVE:  Patients Statement: Autumn Camacho is doing great. She is walking more and more. The biggest challenge she has now is going upstairs. She feels her hip tends to move out to the side when she is trying to step up. She has been practicing with going down and this is feeling better.     OBJECTIVE MEASURES/IMPAIRMENTS:      TREATMENT/SKILLED INTERVENTION & EDUCATION:  Intervention 1:   97110 Therapeutic exercise for 38 minutes                    Side lying hip abd/ER hold 60 sec 3x  Prone hip abduction 10x2  Prone hip extension 10x2    Stairs- able to perform down with good control. Up - cue for slight forward lean and thinking about extending hip versus purely pushing down through foot        Patients learning needs, abilities, preferences and readiness per Initial POC were considered in this session.  Learning verified by teach back.    Time in:0805  Total Treatment Minutes:   38  Total Timed Code Minutes:  House (HEP):   Side lying hip abd/ER hold 60 sec 3x  Prone hip abduction 10x3  Prone hip extension 10x3    a handout was provided describing the exercises in written and picture format     Assessment / Patient Response to Therapy:   Autumn Camacho is advancing well. She will need to focus on gluteal strength now that she is cleared to do so.    Anticipated Discharge Date 12/07/20  LONG TERM GOALS   (Functional Goals)  Current Level of Function /  Participation Restrictions   Patient will be able to perform community ambulation  without assistive device Using cane   Patient will be able to negotiate stairs to enter home without assistive device Using cane and rail for stairs        Short Term Goals To be met by MET or UNMET / Date    Patient will be able to perform household ambulation without assistive device 10/06/20 using cane   Patient will be able to perform 4" step up 5x 10/06/20 able to go down stairs, difficulty with upstairs   Patient will be able to perform 5x sit to stand without UE assist 09/06/20 improving       PLAN:    Continue per treatment plan of care.  Pain will be addressed at the next plan of care review, and intermittently during daily visits as deemed appropriate by the provider.  The focus of daily visits will be on function    Plan for next visit: check stairs, check home program    Vanice Sarah, PT  [facility] and [facility]  Phone: [phone]   FAX: [phone]

## 2020-10-07 ENCOUNTER — Encounter (HOSPITAL_BASED_OUTPATIENT_CLINIC_OR_DEPARTMENT_OTHER): Payer: Self-pay

## 2020-10-31 ENCOUNTER — Encounter (HOSPITAL_BASED_OUTPATIENT_CLINIC_OR_DEPARTMENT_OTHER): Payer: No Typology Code available for payment source | Admitting: Rehabilitative and Restorative Service Providers"

## 2020-11-05 ENCOUNTER — Other Ambulatory Visit (INDEPENDENT_AMBULATORY_CARE_PROVIDER_SITE_OTHER): Payer: Self-pay | Admitting: Physician Assistant

## 2020-11-05 DIAGNOSIS — Z96641 Presence of right artificial hip joint: Secondary | ICD-10-CM

## 2020-11-15 ENCOUNTER — Other Ambulatory Visit (INDEPENDENT_AMBULATORY_CARE_PROVIDER_SITE_OTHER): Payer: No Typology Code available for payment source

## 2020-11-15 ENCOUNTER — Telehealth (INDEPENDENT_AMBULATORY_CARE_PROVIDER_SITE_OTHER): Payer: No Typology Code available for payment source | Admitting: Physician Assistant

## 2020-11-15 DIAGNOSIS — Z96641 Presence of right artificial hip joint: Secondary | ICD-10-CM

## 2020-11-15 NOTE — Progress Notes (Signed)
Personal assistant    Using clinic space and equipment at [place] and [employer], patient participated in a live, phone visit lasting roughly 8 minutes.    Time of roughly 3.5 months s/p right total hip replacement    Chief Complaint: Weakness, left knee pain.     Summary of Conversation:  Discussed patient question regarding exercises and walking.     Plan/Follow-up: Follow-up appointment in 9 months with xrays.  She will focus on lower extremity strengthening exercises, walking, etc.         Norina Buzzard, PA-C  Teaching Associate  Dept of Orthopaedics and Sports Medicine  Joint Replacement/Hip and [place] [facility] [employer] [facility]

## 2020-11-15 NOTE — Patient Instructions (Signed)
Call with concerns or health changes.

## 2020-12-17 HISTORY — PX: TOTAL HIP ARTHROPLASTY: SHX124

## 2020-12-23 ENCOUNTER — Telehealth (HOSPITAL_BASED_OUTPATIENT_CLINIC_OR_DEPARTMENT_OTHER): Payer: Self-pay | Admitting: Nurse Practitioner

## 2020-12-23 NOTE — Telephone Encounter (Signed)
PAC received PA request from [facility] via fax:

## 2020-12-27 ENCOUNTER — Telehealth (HOSPITAL_BASED_OUTPATIENT_CLINIC_OR_DEPARTMENT_OTHER): Payer: Self-pay | Admitting: Pharmacy Technician

## 2020-12-27 NOTE — Telephone Encounter (Addendum)
RETURN CALL: Voicemail - Detailed Message      SUBJECT:  Refill Request    NAME OF MEDICATION(S): valACYclovir 1 g tablet   DATE NEEDED BY: ASAP  PRESCRIBING PROVIDER: Julaine Hua  PHARMACY NAME/LOCATION:   [place] #[id number]  [address] [place] [address]   Phone:  [phone] Fax:  [phone]   ADDITIONAL INFORMATION: Patient needs new PA for above Rx due to dose she takes. Insurance is refusing medication until PA is received. Patient only has 3 days of medication left at this point in time.  Insurance Provider Phone Number:  [phone]

## 2020-12-30 ENCOUNTER — Other Ambulatory Visit (HOSPITAL_BASED_OUTPATIENT_CLINIC_OR_DEPARTMENT_OTHER): Payer: Self-pay

## 2020-12-30 NOTE — Telephone Encounter (Signed)
Please advise the patient to get valacyclovir from her primary care.

## 2020-12-30 NOTE — Telephone Encounter (Addendum)
Received a PA request for Valacyclovir 1 g. This patient has Pre[employer]-ESI and Valacyclovir 1 g is covered for max of 30/30.     Med list shows RX with sig of 1 tablet daily.     Upon checking with pharmacy, RX for Valacyclovir was prescribed by DR Terressa Koyanagi who is not a [place]'s  prescriber with sig of 1BID (#60/30).   Please clarify sig and update RX for Valacyclovir if it is being prescribed by Julaine Hua, MD.

## 2021-01-06 ENCOUNTER — Other Ambulatory Visit (HOSPITAL_BASED_OUTPATIENT_CLINIC_OR_DEPARTMENT_OTHER): Payer: Self-pay

## 2021-03-05 ENCOUNTER — Other Ambulatory Visit (HOSPITAL_BASED_OUTPATIENT_CLINIC_OR_DEPARTMENT_OTHER): Payer: Self-pay | Admitting: Rheumatology

## 2021-03-05 DIAGNOSIS — M069 Rheumatoid arthritis, unspecified: Secondary | ICD-10-CM

## 2021-03-06 MED ORDER — UPADACITINIB ER 15 MG OR TB24
15.0000 mg | EXTENDED_RELEASE_TABLET | Freq: Every morning | ORAL | 1 refills | Status: DC
Start: 2021-03-06 — End: 2021-05-01

## 2021-03-06 NOTE — Telephone Encounter (Signed)
This request falls outside refill center's protocol:    Rinvoq-defer to provider    RAC JAK Inhibitors Protocol Failed 03/05/2021 12:37 PM   Protocol Details  LFTs on record in past 3 months    CBC on record in past 3 months    Hgb greater than 8 in past 3 months    ANC greater than 1.0 10*3/uL in past 3 months    ALC greater than 0.5 10*3/uL in past 3 months     If this medication is denied,please have your staff inform the patient and next visit on 04/16/21

## 2021-04-15 NOTE — Progress Notes (Deleted)
[facility] [place]  [address] [place]  [address]  TEL: [phone]  l  FAX: [phone]        04/16/2021       PRIMARY CARE PROVIDER:  Terressa Koyanagi[place] [address]     CONSULTING PROVIDER:  No ref. provider found            PATIENT:       Autumn Camacho    [id number]     Reason for visit:    Rheumatoid arthritis     Last visit:  06/27/2020     HPI:    Diagnosis: rheumatoid arthritis (1999)   Autoimmune Serologies: Anti-CCP pos, RF pos, ANA neg   Medications Used: methotrexate, enbrel, remicade, humira, orencia, xeljanz, rinvoq.     INTERVAL HISTORY:  At her last visit, her condition appeared to be stable so she was advised to maintain Rinvoq 51m daily and meloxicam 7.524mdaily prn.     Today, ***      AP:  Autumn Camacho a [age]ear old female with history of left knee replacements who came in today for evaluation of rheumatoid arthritis.     She reported ***. She had been taking *** . Labs revealed ***. Exam today showed ***.    I discussed that the patient's condition appeared to be somewhat active*** and ordered surveillance lab tests. I counseled her on treatment plans. I advised her to ***. I will reach out to her after reviewing the labs. Pertinent information including side effects of new medicines were reviewed.     1. Continue Rinvoq 1523mo qd.***  2. 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] 02/02/2020     Added  automatically from request for surgery [id number]      Knee dislocation, left, initial encounter [S83.105A] 01/16/2020    Rheumatoid arthritis involving right hand (HCC)[place]06.9] 10/17/2019    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       ALLERGIES:  Allergies as of 04/16/2021 - Reviewed 09/20/2020   Allergen Reaction Noted    Fentanyl Other 03/20/2020    Hydromorphone GI:NXA:JOINOM/VEHMCNOB02/2021    Remicade [infliximab] Anaphylaxis 08/17/2018    Nortriptyline Other 04/08/2020       Past Medical History:  Past Medical History:  Diagnosis Date   • Chronic right hip pain 02/26/2019   • Fracture     Bilateral elbow fx   • Rheumatoid arthritis (HCC)    • Rheumatoid arthritis (HCC)    • Spinal stenosis    • Tooth disorder     MISSING TOOTH UPPER LEFT BACK-MOLAR       Family History:  Family History     Problem (# of Occurrences) Relation (Name,Age of Onset)    Cancer (1) Father    Heart Attack (1) Father    Rheumatoid Arthritis (1) Other           Past Surgical History:  Past Surgical History:   Procedure Laterality Date   • foot Bilateral     Arch surgery   • KNEE ARTHROPLASTY      LEFT 2018 REPAIR 2019   • KNEE ARTHROPLASTY Left 05/01/2020   • PR ADENOIDECTOMY PRIMARY <AGE 14      AGE 21  T/A    • PR ANES; TOTAL KNEE REPLACEMENT Left 04/01/2020   • PR TONSILLECTOMY ONE-HALF <AGE 14     • PR UNLISTED PROCEDURE FEMUR/KNEE Bilateral     TKA   • PR UNLISTED PROCEDURE FEMUR/KNEE       Left revision TKA    • PR UNLISTED PROCEDURE FOOT/TOES Bilateral 2009 R,  2010 L    Reconstructive surgery   • PR UNLISTED PROCEDURE PELVIS/HIP JOINT      DOS 07/30/20 R THA   • TKA Left 2019    revision from prior TKA       IMMUNIZATION HISTORY:  Immunization History   Administered Date(s) Administered   • COVID-19 Pfizer mRNA 12 yrs and older (purple cap) 01/10/2020, 01/27/2020, 06/06/2020   • Influenza quadrivalent 07/20/2019   • Tdap 05/24/2019   • Zoster recombinant (Shingrix) 06/22/2019, 07/20/2019, 08/26/2019       ALLERGIES: Fentanyl, Hydromorphone, Remicade [Infliximab], and Nortriptyline    SOCIAL HISTORY:  reports that she quit smoking about 7 years ago. Her smoking use included cigarettes. She has a 20.00 pack-year smoking history. She has never used smokeless tobacco. She reports previous alcohol use. She reports that she does not use drugs.    FAMILY HISTORY: family history includes Cancer in her father; Heart Attack in her father; Rheumatoid Arthritis in an other family member.    PHYSICAL EXAMINATION:  Vital signs: There were no vitals taken for this visit.  General: healthy, alert, no distress  Skin: Skin color, texture, turgor normal. No rashes or concerning lesions  HEENT: Normocephalic. No masses, lesions, tenderness or abnormalities  Lungs: clear to auscultation  Heart: normal rate, regular rhythm and no murmurs, clicks, or gallops  Abd: soft, non-tender. BS normal. No masses or organomegaly  Ext: Normal, without deformities, edema, or skin discoloration, radial and DP pulses 2+ bilaterally  Neuro:  Grossly normal to observation, gait normal  Musculoskeletal: ***  Hands: No tenderness or swelling in MCP or PIP joints   Wrists: No tenderness or swelling, full ROM, negative Tinel's sign  Elbows: No tenderness or swelling, full ROM, no tenderness in lateral epicondyle  Shoulders: No tenderness or swelling, full ROM  Hips: Full ROM, no tenderness in greater trochanter  Knees: No tenderness or  swelling, full ROM, no crepitus.  Ankles: No tenderness or swelling, full ROM  Feet: No tenderness or swelling in MTP joints  C-spine: Full ROM, no tenderness in spinous process or paraspinal muscles  L-Spine: Full ROM, no tenderness in spinous   process or paraspinal muscles, no tenderness in SI joints, negative FABER test, negative straight leg raising test  Fibromyalgia tender points: Tenderness over multiple fibromyalgia tender points including anterior neck, second rib, occiput, trapezius, supraspinatus    LABS:  Results for orders placed or performed during the hospital encounter of 07/30/20   CBC   Result Value Ref Range    WBC 5.23 4.3 - 10.0 10*3/uL    RBC 3.32 (L) 3.80 - 5.00 10*6/uL    Hemoglobin 10.4 (L) 11.5 - 15.5 g/dL    Hematocrit 33 (L) 36 - 45 %    MCV 101 (H) 81 - 98 fL    MCH 31.3 27.3 - 33.6 pg    MCHC 31.1 (L) 32.2 - 36.5 g/dL    Platelet Count 194 150 - 400 10*3/uL    RDW-CV 13.7 11.6 - 14.4 %   Basic Metabolic Panel   Result Value Ref Range    Sodium 138 135 - 145 meq/L    Potassium 4.5 3.6 - 5.2 meq/L    Chloride 106 98 - 108 meq/L    Carbon Dioxide, Total 28 22 - 32 meq/L    Anion Gap 4 4 - 12    Glucose 92 62 - 125 mg/dL    Urea Nitrogen 7 (L) 8 - 21 mg/dL    Creatinine 0.47 0.38 - 1.02 mg/dL    Calcium 8.5 (L) 8.9 - 10.2 mg/dL    eGFR by CKD-EPI >60 >59 mL/min/[1.73_m2]    GFR, Information       Calculated GFR by CKD-EPI equation. Inaccurate with changing renal function. See http://depts.[employer].edu/labweb/test/bclim/cGFR.html.   POC Glucose, Whole Blood - [place]   Result Value Ref Range    Glucose, Finger Stick POC 78 62 - 125 mg/dL       IMAGING:  None    IMPRESSION/RECOMMENDATIONS:   Jess Hinshaw Victoria is a 55 year old *** female with history of *** who was seen today for evaluation of ***.     {A/P Han Return Discussion:[id number]}. I counseled her on treatment options. I advised her to maintain ***. I reviewed pertinent information including side effects of new medications.      Assessment:  1.     PLAN:  The following was discussed with the patient.  Recommendations are as follows:  1.     There are no diagnoses linked to this encounter.       All the patient's questions were answered.    Follow up: *** months    ***scribe signature

## 2021-04-16 ENCOUNTER — Other Ambulatory Visit (HOSPITAL_BASED_OUTPATIENT_CLINIC_OR_DEPARTMENT_OTHER): Payer: Self-pay | Admitting: Rheumatology

## 2021-04-16 ENCOUNTER — Encounter (HOSPITAL_BASED_OUTPATIENT_CLINIC_OR_DEPARTMENT_OTHER): Payer: Self-pay | Admitting: Rheumatology

## 2021-04-16 ENCOUNTER — Ambulatory Visit: Payer: Medicare Other | Attending: Rheumatology | Admitting: Rheumatology

## 2021-04-16 VITALS — BP 106/70 | HR 77 | Temp 98.2°F | Wt 175.0 lb

## 2021-04-16 DIAGNOSIS — M0579 Rheumatoid arthritis with rheumatoid factor of multiple sites without organ or systems involvement: Secondary | ICD-10-CM | POA: Insufficient documentation

## 2021-04-16 DIAGNOSIS — Z79899 Other long term (current) drug therapy: Secondary | ICD-10-CM

## 2021-04-16 LAB — LIPID PANEL
Cholesterol (LDL): 93 mg/dL (ref <130)
Cholesterol/HDL Ratio: 2.8
HDL Cholesterol: 66 mg/dL (ref >39)
Non-HDL Cholesterol: 117 mg/dL (ref 0–159)
Total Cholesterol: 183 mg/dL (ref <200)
Triglyceride: 122 mg/dL (ref <150)

## 2021-04-16 LAB — COMPREHENSIVE METABOLIC PANEL
ALT (GPT): 12 U/L (ref 7–33)
AST (GOT): 17 U/L (ref 9–38)
Albumin: 4.3 g/dL (ref 3.5–5.2)
Alkaline Phosphatase (Total): 80 U/L (ref 34–121)
Anion Gap: 9 (ref 4–12)
Bilirubin (Total): 0.4 mg/dL (ref 0.2–1.3)
Calcium: 9.4 mg/dL (ref 8.9–10.2)
Carbon Dioxide, Total: 25 meq/L (ref 22–32)
Chloride: 106 meq/L (ref 98–108)
Creatinine: 0.69 mg/dL (ref 0.38–1.02)
Glucose: 74 mg/dL (ref 62–125)
Potassium: 4.8 meq/L (ref 3.6–5.2)
Protein (Total): 7.2 g/dL (ref 6.0–8.2)
Sodium: 140 meq/L (ref 135–145)
Urea Nitrogen: 10 mg/dL (ref 8–21)
eGFR by CKD-EPI: >60 mL/min/{1.73_m2} (ref >59)

## 2021-04-16 LAB — CBC, DIFF
% Basophils: 1 %
% Eosinophils: 2 %
% Immature Granulocytes: 1 %
% Lymphocytes: 34 %
% Monocytes: 6 %
% Neutrophils: 56 %
% Nucleated RBC: 0 %
Absolute Eosinophil Count: 0.14 10*3/uL (ref 0.00–0.50)
Absolute Lymphocyte Count: 2.07 10*3/uL (ref 1.00–4.80)
Basophils: 0.03 10*3/uL (ref 0.00–0.20)
Hematocrit: 45 % (ref 36.0–45.0)
Hemoglobin: 14.9 g/dL (ref 11.5–15.5)
Immature Granulocytes: 0.03 10*3/uL (ref 0.00–0.05)
MCH: 31.2 pg (ref 27.3–33.6)
MCHC: 32.8 g/dL (ref 32.2–36.5)
MCV: 95 fL (ref 81–98)
Monocytes: 0.35 10*3/uL (ref 0.00–0.80)
Neutrophils: 3.43 10*3/uL (ref 1.80–7.00)
Nucleated RBC: 0 10*3/uL
Platelet Count: 298 10*3/uL (ref 150–400)
RBC: 4.78 10*6/uL (ref 3.80–5.00)
RDW-CV: 13.8 % (ref 11.6–14.4)
WBC: 6.05 10*3/uL (ref 4.3–10.0)

## 2021-04-16 LAB — C_REACTIVE PROTEIN: C_Reactive Protein: 1.2 mg/L (ref 0.0–10.0)

## 2021-04-16 LAB — SED RATE: Erythrocyte Sedimentation Rate: 18 mm/h (ref 0–20)

## 2021-04-16 MED ORDER — LEFLUNOMIDE 20 MG OR TABS
20.0000 mg | ORAL_TABLET | Freq: Every day | ORAL | 2 refills | Status: DC
Start: 2021-04-16 — End: 2021-05-20

## 2021-04-16 NOTE — Progress Notes (Signed)
I, Karinna Beadles, MD, personally performed the services as described in this documentation. All medical record entries made by the scribe were at my direction and in my presence. I have reviewed the chart and discharge instructions and agree that the record reflects my personal performance and is accurate and complete.

## 2021-04-16 NOTE — Progress Notes (Signed)
[facility] [place]  [address] [place]  [address]  TEL: [phone]  l  FAX: [phone]        04/16/2021       PRIMARY CARE PROVIDER:  Terressa Koyanagi, [place] [address]     CONSULTING PROVIDER:  No ref. provider found            PATIENT:       Autumn Camacho    [id number]     Reason for visit:    Rheumatoid arthritis     Last visit:  06/27/2020     HPI:    Diagnosis: rheumatoid arthritis (1999)   Autoimmune Serologies: Anti-CCP pos, RF pos, ANA neg   Medications Used: methotrexate, enbrel, remicade, humira, orencia, xeljanz, rinvoq.     INTERVAL HISTORY:  At her last visit, her condition appeared to be stable so she was advised to maintain Rinvoq '15mg'$  daily and meloxicam 7.'5mg'$  daily prn.     Today, she reports that she has been doing really well with Rinvoq and meloxicam. She denies side effects with Rinvoq. She continues to have joint pain especially in the morning for about an hour and at the end of the day. This morning pain is usually in the elbows and hands, and is rated as a 6/10 in severity. She also notes that she has hand pain that worsens with chopping and other such cooking activities. She has taken methotrexate and leflunomide before, and reports side effects of nausea and hair loss with oral methotrexate. She does not remember having side effects to leflunomide.     She had a right hip replacement in October 2021, and a left knee replacement in July 2021.    She notes a family (father) history of MI, and TIA. She denies diabetes, hyperlipidemia or hypertension.     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] 05/10/2020     Added automatically from request for surgery [id number]      Failed total knee replacement, subsequent encounter [T84.018D, Z96.659] 05/01/2020    Chronic instability of left  knee [M23.52] 04/16/2020     Added automatically from request for surgery [id number]      Failed total left knee replacement ([place]) [T84.093A] 02/02/2020     Added automatically from request for surgery [id number]      Knee dislocation, left, initial encounter [S83.105A] 01/16/2020    Rheumatoid arthritis involving right hand ([place]) [M06.9] 10/17/2019    Arthritis of carpometacarpal (CMC) joints of both thumbs [M18.0] 10/17/2019    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       ALLERGIES:  Allergies as of 04/16/2021 - Reviewed 09/20/2020   Allergen Reaction Noted    Fentanyl Other 03/20/2020    Hydromorphone GG:YIRSWN/IOEVOJJK 03/20/2020    Remicade [  infliximab] Anaphylaxis 08/17/2018    Nortriptyline Other 04/08/2020       Past Medical History:  Past Medical History:   Diagnosis Date    Chronic right hip pain 02/26/2019    Fracture     Bilateral elbow fx    Rheumatoid arthritis ([place])     Rheumatoid arthritis ([place])     Spinal stenosis     Tooth disorder     MISSING TOOTH UPPER LEFT BACK-MOLAR       Family History:  Family History     Problem (# of Occurrences) Relation (Name,Age of Onset)    Cancer (1) Father    Heart Attack (1) Father    Rheumatoid Arthritis (1) Other           Past Surgical History:  Past Surgical History:   Procedure Laterality Date    foot Bilateral     Arch surgery    KNEE ARTHROPLASTY      LEFT 2018 REPAIR 2019    KNEE ARTHROPLASTY Left 05/01/2020    PR ADENOIDECTOMY PRIMARY <AGE 41      AGE 52  T/A     PR ANES; TOTAL KNEE  REPLACEMENT Left 04/01/2020    PR TONSILLECTOMY ONE-HALF <AGE 41      PR UNLISTED PROCEDURE FEMUR/KNEE Bilateral     TKA    PR UNLISTED PROCEDURE FEMUR/KNEE      Left revision TKA     PR UNLISTED PROCEDURE FOOT/TOES Bilateral 2009 R,  2010 L    Reconstructive surgery    PR UNLISTED PROCEDURE PELVIS/HIP JOINT      DOS 07/30/20 R THA    TKA Left 2019    revision from prior TKA       IMMUNIZATION HISTORY:  Immunization History   Administered Date(s) Administered    COVID-19 Pfizer mRNA 12 yrs and older (purple cap) 01/10/2020, 01/27/2020, 06/06/2020    Influenza quadrivalent 07/20/2019    Tdap 05/24/2019    Zoster recombinant (Shingrix) 06/22/2019, 07/20/2019, 08/26/2019       ALLERGIES: Fentanyl, Hydromorphone, Remicade [Infliximab], and Nortriptyline    SOCIAL HISTORY:  reports that she quit smoking about 7 years ago. Her smoking use included cigarettes. She has a 20.00 pack-year smoking history. She has never used smokeless tobacco. She reports previous alcohol use. She reports that she does not use drugs.    FAMILY HISTORY: family history includes Cancer in her father; Heart Attack in her father; Rheumatoid Arthritis in an other family member.    PHYSICAL EXAMINATION:  Vital signs: There were no vitals taken for this visit.  General: healthy, alert, no distress  Skin: Skin color, texture, turgor normal. No rashes or concerning lesions  HEENT: Normocephalic. No masses, lesions, tenderness or abnormalities  Lungs: clear to auscultation  Heart: normal rate, regular rhythm and no murmurs, clicks, or gallops  Abd: soft, non-tender. BS normal. No masses or organomegaly  Ext: Normal, without deformities, edema, or skin discoloration, radial and DP pulses 2+ bilaterally  Neuro:  Grossly normal to observation, gait normal  Musculoskeletal:   Hands: Mild tenderness in MCP2,3,4,5 (both), ulnar deviation in MCP joints bilaterally  Wrists: No tenderness or swelling, full ROM, negative Tinel's sign  Elbows: Tenderness  in both elbows, deformity in elbows bilaterally  Shoulders: No tenderness or swelling, full ROM  Hips: s/p right hip replacement  Knees: s/p left knee replacement  Ankles: No tenderness or swelling, full ROM    LABS:  Results for orders placed or performed during the hospital  encounter of 07/30/20   CBC   Result Value Ref Range    WBC 5.23 4.3 - 10.0 10*3/uL    RBC 3.32 (L) 3.80 - 5.00 10*6/uL    Hemoglobin 10.4 (L) 11.5 - 15.5 g/dL    Hematocrit 33 (L) 36 - 45 %    MCV 101 (H) 81 - 98 fL    MCH 31.3 27.3 - 33.6 pg    MCHC 31.1 (L) 32.2 - 36.5 g/dL    Platelet Count 194 150 - 400 10*3/uL    RDW-CV 13.7 11.6 - 14.4 %   Basic Metabolic Panel   Result Value Ref Range    Sodium 138 135 - 145 meq/L    Potassium 4.5 3.6 - 5.2 meq/L    Chloride 106 98 - 108 meq/L    Carbon Dioxide, Total 28 22 - 32 meq/L    Anion Gap 4 4 - 12    Glucose 92 62 - 125 mg/dL    Urea Nitrogen 7 (L) 8 - 21 mg/dL    Creatinine 0.47 0.38 - 1.02 mg/dL    Calcium 8.5 (L) 8.9 - 10.2 mg/dL    eGFR by CKD-EPI >60 >59 mL/min/[1.73_m2]    GFR, Information       Calculated GFR by CKD-EPI equation. Inaccurate with changing renal function. See http://depts.YourCloudFront.fr.html.   POC Glucose, Whole Blood - [facility]   Result Value Ref Range    Glucose, Finger Stick POC 78 62 - 125 mg/dL       IMAGING:  None      IMPRESSION/RECOMMENDATIONS:     Autumn Camacho is a 55 year old female with history of left knee and right hip replacements who came in today for evaluation of rheumatoid arthritis. She reported that she has been doing well with Rinvoq and meloxicam. She still experienced joint pain in hands and elbows especially in the morning for about an hour and at the end of the day. She rated her pain at a 6/10 in severity. She also noted that her hand pain worsened with chopping and other such cooking activities. She took leflunomide before and denied having had side effects. Exam today showed tenderness and deformity in  MCPs and elbows.    I discussed that the patient's condition appeared to be somewhat active with current medications and ordered surveillance lab tests. I counseled her on treatment plans. I advised her to maintain Rinvoq and add leflunomide to see if her symptoms improve. I will reach out to her after reviewing the labs. Pertinent information including side effects of new medicines were reviewed. I informed her of potential cardiovascular or cancer risks with Rinvoq and recommended that she follow up with her primary care provider regularly.       ASSESSMENT:  1. Rheumatoid arthritis     PLAN:  The following was discussed with the patient.  Recommendations are as follows:  1. Labs today, as listed below:   2. Continue Rinvoq '15mg'$  po qd.  3. Continue meloxicam 7.'5mg'$  po qd prn.  4. Start leflunomide '20mg'$  po qd.  5. Repeat labs again after 1-2 months     - Comprehensive Metabolic Panel  - CBC with Diff  - C-Reactive Protein  - Erythrocyte Sedimentation Rate  - Lipid Panel; Future      Follow up: 3-4 months    04/16/2021 @ 10:55 AM - I, Elson Clan, Medical Scribe acted as a Education administrator and documented the service/procedure performed to the best of my knowledge in  the presence of Julaine Hua, MD who will provide the final review and authentication.  Signed: Elson Clan, Medical Scribe

## 2021-04-29 ENCOUNTER — Other Ambulatory Visit (HOSPITAL_BASED_OUTPATIENT_CLINIC_OR_DEPARTMENT_OTHER): Payer: Self-pay | Admitting: Rheumatology

## 2021-04-29 DIAGNOSIS — M069 Rheumatoid arthritis, unspecified: Secondary | ICD-10-CM

## 2021-05-01 MED ORDER — RINVOQ 15 MG OR TB24
EXTENDED_RELEASE_TABLET | ORAL | 2 refills | Status: DC
Start: 2021-05-01 — End: 2021-07-23

## 2021-05-20 ENCOUNTER — Encounter (HOSPITAL_BASED_OUTPATIENT_CLINIC_OR_DEPARTMENT_OTHER): Payer: Self-pay | Admitting: Rheumatology

## 2021-05-20 DIAGNOSIS — M0579 Rheumatoid arthritis with rheumatoid factor of multiple sites without organ or systems involvement: Secondary | ICD-10-CM

## 2021-05-20 MED ORDER — MELOXICAM 7.5 MG OR TABS
7.5000 mg | ORAL_TABLET | Freq: Every day | ORAL | 2 refills | Status: DC
Start: 2021-05-20 — End: 2021-07-08

## 2021-05-20 MED ORDER — LEFLUNOMIDE 20 MG OR TABS
20.0000 mg | ORAL_TABLET | Freq: Every day | ORAL | 2 refills | Status: DC
Start: 2021-05-20 — End: 2021-08-05

## 2021-05-20 NOTE — Telephone Encounter (Signed)
[place] [phone]     The patient is requesting a Meloxicam dose change, so the [facility] will not be able to prescribe.    Routing to Dr. Janan Halter.

## 2021-06-11 ENCOUNTER — Telehealth (HOSPITAL_BASED_OUTPATIENT_CLINIC_OR_DEPARTMENT_OTHER): Payer: Self-pay | Admitting: Rheumatology

## 2021-06-11 NOTE — Telephone Encounter (Signed)
Reference number for call [id number]    Pharmacy is processing a  Refill  prescription for Meloxicam 7.5 mg. Another pharmacy filled for a different strength, same medication.    They need clarification.    Please call with reference number above.

## 2021-06-11 NOTE — Telephone Encounter (Signed)
I called [facility] back. There is a long hold at this time. I did not get to speak with a person.    When [facility] calls back, please transfer to me.

## 2021-06-30 ENCOUNTER — Other Ambulatory Visit (INDEPENDENT_AMBULATORY_CARE_PROVIDER_SITE_OTHER): Payer: Self-pay | Admitting: Orthopaedic Surgery

## 2021-07-08 ENCOUNTER — Encounter (HOSPITAL_BASED_OUTPATIENT_CLINIC_OR_DEPARTMENT_OTHER): Payer: Self-pay | Admitting: Rheumatology

## 2021-07-08 DIAGNOSIS — M0579 Rheumatoid arthritis with rheumatoid factor of multiple sites without organ or systems involvement: Secondary | ICD-10-CM

## 2021-07-08 MED ORDER — MELOXICAM 7.5 MG OR TABS
7.5000 mg | ORAL_TABLET | Freq: Every day | ORAL | 2 refills | Status: DC
Start: 2021-07-08 — End: 2021-11-19

## 2021-07-08 NOTE — Telephone Encounter (Signed)
It's OK to take meloxicam. A prescription has been sent in.

## 2021-07-14 ENCOUNTER — Encounter (HOSPITAL_BASED_OUTPATIENT_CLINIC_OR_DEPARTMENT_OTHER): Payer: Self-pay | Admitting: Rheumatology

## 2021-07-16 ENCOUNTER — Encounter (HOSPITAL_BASED_OUTPATIENT_CLINIC_OR_DEPARTMENT_OTHER): Payer: No Typology Code available for payment source | Admitting: Rheumatology

## 2021-07-21 ENCOUNTER — Encounter (HOSPITAL_BASED_OUTPATIENT_CLINIC_OR_DEPARTMENT_OTHER): Payer: No Typology Code available for payment source | Admitting: Rheumatology

## 2021-07-23 ENCOUNTER — Other Ambulatory Visit (HOSPITAL_BASED_OUTPATIENT_CLINIC_OR_DEPARTMENT_OTHER): Payer: Self-pay | Admitting: Rheumatology

## 2021-07-23 DIAGNOSIS — M069 Rheumatoid arthritis, unspecified: Secondary | ICD-10-CM

## 2021-07-24 MED ORDER — UPADACITINIB ER 15 MG OR TB24
15.0000 mg | EXTENDED_RELEASE_TABLET | Freq: Every morning | ORAL | 1 refills | Status: DC
Start: 2021-07-24 — End: 2021-10-02

## 2021-07-24 NOTE — Telephone Encounter (Signed)
Patient last seen on 04/16/21 for visit and was to return in 3-4 months  .  One refill authorized.  Please schedule follow up visit.

## 2021-07-24 NOTE — Telephone Encounter (Signed)
Last labs found 04/16/21 wnl   Consistent on med(s) per reconcile dispense report

## 2021-08-05 ENCOUNTER — Other Ambulatory Visit (HOSPITAL_BASED_OUTPATIENT_CLINIC_OR_DEPARTMENT_OTHER): Payer: Self-pay | Admitting: Rheumatology

## 2021-08-05 DIAGNOSIS — M0579 Rheumatoid arthritis with rheumatoid factor of multiple sites without organ or systems involvement: Secondary | ICD-10-CM

## 2021-08-05 MED ORDER — LEFLUNOMIDE 20 MG OR TABS
ORAL_TABLET | ORAL | 0 refills | Status: DC
Start: 2021-08-05 — End: 2021-10-27

## 2021-08-08 ENCOUNTER — Other Ambulatory Visit: Payer: Self-pay

## 2021-08-08 ENCOUNTER — Telehealth (INDEPENDENT_AMBULATORY_CARE_PROVIDER_SITE_OTHER): Payer: Self-pay | Admitting: Orthopaedic Surgery

## 2021-08-08 ENCOUNTER — Emergency Department
Admission: EM | Admit: 2021-08-08 | Discharge: 2021-08-08 | Disposition: A | Discharge status: Home / Self Care | Payer: No Typology Code available for payment source | Attending: Emergency Medicine | Admitting: Emergency Medicine

## 2021-08-08 ENCOUNTER — Emergency Department (EMERGENCY_DEPARTMENT_HOSPITAL): Payer: No Typology Code available for payment source

## 2021-08-08 ENCOUNTER — Encounter (HOSPITAL_COMMUNITY): Payer: Self-pay | Admitting: Unknown Physician Specialty

## 2021-08-08 DIAGNOSIS — M25551 Pain in right hip: Secondary | ICD-10-CM

## 2021-08-08 DIAGNOSIS — Y999 Unspecified external cause status: Secondary | ICD-10-CM | POA: Insufficient documentation

## 2021-08-08 DIAGNOSIS — T84020A Dislocation of internal right hip prosthesis, initial encounter: Secondary | ICD-10-CM

## 2021-08-08 DIAGNOSIS — Z20822 Contact with and (suspected) exposure to covid-19: Secondary | ICD-10-CM | POA: Insufficient documentation

## 2021-08-08 DIAGNOSIS — Y9389 Activity, other specified: Secondary | ICD-10-CM | POA: Insufficient documentation

## 2021-08-08 DIAGNOSIS — Y92009 Unspecified place in unspecified non-institutional (private) residence as the place of occurrence of the external cause: Secondary | ICD-10-CM | POA: Insufficient documentation

## 2021-08-08 DIAGNOSIS — S73004A Unspecified dislocation of right hip, initial encounter: Secondary | ICD-10-CM

## 2021-08-08 DIAGNOSIS — Y792 Prosthetic and other implants, materials and accessory orthopedic devices associated with adverse incidents: Secondary | ICD-10-CM | POA: Insufficient documentation

## 2021-08-08 DIAGNOSIS — X500XXA Overexertion from strenuous movement or load, initial encounter: Secondary | ICD-10-CM | POA: Insufficient documentation

## 2021-08-08 HISTORY — DX: Unspecified asthma, uncomplicated: J45.909

## 2021-08-08 HISTORY — DX: Herpesviral infection of urogenital system, unspecified: A60.00

## 2021-08-08 LAB — TRAUMA PANEL
Alcohol (Ethyl): NEGATIVE mg/dL
Anion Gap: 11 (ref 4–12)
Calcium: 8.9 mg/dL (ref 8.9–10.2)
Carbon Dioxide, Total: 22 meq/L (ref 22–32)
Chloride: 106 meq/L (ref 98–108)
Creatinine: 0.59 mg/dL (ref 0.38–1.02)
Glucose: 111 mg/dL (ref 62–125)
Hematocrit: 43 % (ref 36.0–45.0)
Hemoglobin: 13.9 g/dL (ref 11.5–15.5)
Lipase: 35 U/L (ref <70)
MCH: 31.4 pg (ref 27.3–33.6)
MCHC: 32.5 g/dL (ref 32.2–36.5)
MCV: 97 fL (ref 81–98)
Partial Thromboplastin Time: 25 s (ref 22–35)
Platelet Count: 264 10*3/uL (ref 150–400)
Potassium: 3.7 meq/L (ref 3.6–5.2)
Prothrombin INR: 0.9 (ref 0.8–1.3)
Prothrombin Time Patient: 12.4 s (ref 10.7–15.6)
RBC: 4.42 10*6/uL (ref 3.80–5.00)
RDW-CV: 13.6 % (ref 11.0–14.5)
Sodium: 139 meq/L (ref 135–145)
Urea Nitrogen: 10 mg/dL (ref 8–21)
WBC: 6.35 10*3/uL (ref 4.3–10.0)
eGFR by CKD-EPI 2021: >60 mL/min/{1.73_m2} (ref >59)

## 2021-08-08 LAB — SARS-COV-2 (COVID-19) QUALITATIVE RAPID PCR: COVID-19 Coronavirus Qual PCR Result: NOT DETECTED

## 2021-08-08 LAB — LAB ADD ON ORDER

## 2021-08-08 LAB — C_REACTIVE PROTEIN: C_Reactive Protein: 2.9 mg/L (ref 0.0–10.0)

## 2021-08-08 LAB — 1ST EXTRA PEARL TOP

## 2021-08-08 LAB — 1ST EXTRA ORANGE TOP

## 2021-08-08 LAB — PREGNANCY (HCG), SERUM, QUANT: Pregnancy (HCG), SRM: 2 m[IU]/mL (ref <6)

## 2021-08-08 LAB — SED RATE: Erythrocyte Sedimentation Rate: 27 mm/h — ABNORMAL HIGH (ref 0–20)

## 2021-08-08 MED ORDER — PROPOFOL 200 MG/20ML IV EMUL
INTRAVENOUS | Status: DC
Start: 2021-08-08 — End: 2021-08-08
  Filled 2021-08-08: qty 20

## 2021-08-08 MED ORDER — PROPOFOL 200 MG/20ML IV EMUL
Freq: Once | INTRAVENOUS | Status: AC | PRN
Start: 2021-08-08 — End: 2021-08-08
  Administered 2021-08-08: 20 mg via INTRAVENOUS
  Administered 2021-08-08: 40 mg via INTRAVENOUS
  Administered 2021-08-08: 10 mg via INTRAVENOUS
  Administered 2021-08-08: 20 mg via INTRAVENOUS
  Administered 2021-08-08: 10 mg via INTRAVENOUS

## 2021-08-08 MED ORDER — KETAMINE HCL-SODIUM CHLORIDE 50-0.9 MG/5ML-% IV SOSY
PREFILLED_SYRINGE | INTRAVENOUS | Status: DC
Start: 2021-08-08 — End: 2021-08-08
  Filled 2021-08-08: qty 10

## 2021-08-08 MED ORDER — MORPHINE SULFATE (PF) 4 MG/ML IV/IJ SOLN WRAPPER
4.0000 mg | Freq: Once | Status: AC
Start: 2021-08-08 — End: 2021-08-08
  Administered 2021-08-08: 4 mg via INTRAVENOUS
  Filled 2021-08-08: qty 1

## 2021-08-08 MED ORDER — KETAMINE HCL 10 MG/ML IJ SOLN
Freq: Once | INTRAMUSCULAR | Status: AC | PRN
Start: 2021-08-08 — End: 2021-08-08
  Administered 2021-08-08 (×2): 40 mg via INTRAVENOUS
  Administered 2021-08-08: 20 mg via INTRAVENOUS

## 2021-08-08 MED ORDER — ONDANSETRON HCL 4 MG/2ML IJ SOLN
4.0000 mg | Freq: Once | INTRAMUSCULAR | Status: AC
Start: 2021-08-08 — End: 2021-08-08
  Administered 2021-08-08: 4 mg via INTRAVENOUS
  Filled 2021-08-08: qty 2

## 2021-08-08 NOTE — ED Triage Notes (Signed)
Pt BIB [facility] for hip pain. Pt was sitting on the couch when she bent over and heard a pop. Unable to ambulate.

## 2021-08-08 NOTE — Progress Notes (Signed)
ED Social Work Assessment    ID / CC / Reason for Referral: Autumn Camacho is a 55yo female who was BIBA for hip pain.      Identifying data/reason for referral:  Referral Source: Self referral (case-finding)  Referral Reason: Counseling/support    Social Work Summary:  HPI: EDSW meets with pt's husband, Autumn Camacho when he presents to the ED waiting room and escorts him back to pt's bedside with pt and RN consent. He reports that pt has had a hip replacement and he is concerned that pt may have dislocated that hip joint. EDSW provides support, obtains phone #'s and discusses the current [place] visitor policy. Pt continues to be evaluated medically, EDSW available for continued support as needed.    Social History:  Level of independence prior to admission: IKndependent  Living situation prior to admit: Home  Support system: Spouse/significant other       Healthcare Decision Making Information: Spouse is Interior and spatial designer in Following Order for Legal Next of Kin Decision Making:  1. Patient as able        2.  Autumn Guinda, spouse, [phone]  Other Family/Friends/Contact:    Language: English  Interpreter: No     Impression: Pt is a 55yo female with hip pain and family at bedside.    Plan: Pt continues to be evaluated medically, EDSW available for continued support as needed.          Lynett Fish, LICSW   Emergency Services Social Worker  [facility]  [phone]  SW Emergency department services (minutes): 30

## 2021-08-08 NOTE — ED Provider Notes (Signed)
CHIEF COMPLAINT   Chief Complaint   Patient presents with    Hip Pain            HISTORY OF PRESENT ILLNESS   HPI    Pain over right lateral hip and groin as well as inferior right buttock area.  Is severe.  Additionally accompanied by some dullness in sensation below the right knee as well as some "tingling "feeling over the right foot up to the knee.  She denies hitting her head or falling after this occurred and reports that she just sat down on her couch.  She has not had something like this occur before.  Her initial right hip replacement was done in October 2021 by Dr. Meda Coffee at [place].            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       Past Surgical History:   Procedure Laterality Date    foot Bilateral     Arch surgery    KNEE ARTHROPLASTY      LEFT 2018 REPAIR 2019    KNEE ARTHROPLASTY Left 05/01/2020    PR ADENOIDECTOMY PRIMARY <AGE 58      AGE 62  T/A     PR ANES; TOTAL KNEE REPLACEMENT Left 04/01/2020    PR TONSILLECTOMY ONE-HALF <AGE 85      PR UNLISTED PROCEDURE FEMUR/KNEE Bilateral     TKA    PR UNLISTED PROCEDURE FEMUR/KNEE      Left revision TKA     PR UNLISTED PROCEDURE FOOT/TOES Bilateral 2009 R,  2010 L    Reconstructive surgery    PR UNLISTED PROCEDURE PELVIS/HIP JOINT      DOS 07/30/20 R THA    TKA Left 2019    revision from prior TKA          MEDICATIONS AND ALLERGIES     OUTPATIENT MEDICATIONS:   Current Outpatient Medications   Medication Instructions    acetaminophen (TYLENOL) 1,000 mg, Oral, 3 times daily    leflunomide 20 MG tablet TAKE 1 TABLET DAILY    meloxicam (MOBIC) 7.5-15 mg, Oral, Daily    upadacitinib ER (RINVOQ) 15 mg, Oral, Every morning    valACYclovir 1 g tablet TAKE 1 TABLET(1000 MG) BY  MOUTH DAILY       ALLERGIES:   Fentanyl, Hydromorphone, Remicade [infliximab], Codeine, and Nortriptyline          SOCIAL HISTORY   Social History     Tobacco Use    Smoking status: Former     Packs/day: 1.00     Years: 20.00     Pack years: 20.00     Types: Cigarettes     Quit date: 2015     Years since quitting: 7.8    Smokeless tobacco: Never    Tobacco comments:     hx : Off and on smoker but had vape , quit 2018.   Substance Use Topics    Alcohol use: Not Currently     Comment: remote history of alcohol dependence    Drug use: Never  PAST FAMILY HISTORY   Family History     Problem (# of Occurrences) Relation (Name,Age of Onset)    Cancer (1) Father    Heart Attack (1) Father    Rheumatoid Arthritis (1) Other                     REVIEW OF SYSTEMS   Review of Systems  ROS was completed and was negative except for HPI and right low back pain, which the patient has at baseline. ROS that was completed was 10/14 systems: Constitutional, Skin, HENT, Respiratory, Cardiac, GI, GU, Musculoskeletal, Neurologic, and Psychiatric      PHYSICAL EXAM   ED VITALS:  Vitals (Arrival)      T: 36.6 C (08/08/21 1106)  BP: 127/70 (08/08/21 1106)  HR: 72 (08/08/21 1106)  RR: 18 (08/08/21 1106)  SpO2: 98 % (08/08/21 1106) Room air   Vitals (Most recent in last 24 hrs)   T: 36.3 C (08/08/21 1407)  BP: 109/69 (08/08/21 1545)  HR: 74 (08/08/21 1545)  RR: 14 (08/08/21 1545)  SpO2: 96 % (08/08/21 1545) Room air  T range: Temp  Min: 36.1 C  Max: 36.6 C  Wt 169 lb 12.1 oz (77 kg)     (no height taken for this visit)     Body mass index is 32.07 kg/m.     Physical Exam   GENERAL: Mild distress  SKIN: Warm and well perfused. No rashes, bruises, discolorations or abrasions.  HEAD: Atraumatic, normocephalic without edema, discoloration or evidence of trauma. Facial bones without deformities or tenderness.  EYES: PERRL. No scleral icterus or conjunctival injection. Extraocular muscles intact without nystagmus or diplopia. No  proptosis or enophthalmos.  MOUTH: No malocclusion or trismus. Moist mucus membranes without blood. Posterior pharynx without erythema or exudate.  NECK: Trachea midline. No discolorations or edema.  CV: Regular rate and rhythm, Normal s1 and s2. No murmurs, rubs, or gallops.  PV: Radial pulses 2+ bilaterally and symmetric. Dorsalis pedis pulses 2+ bilaterally and symmetric. 2+ capillary refill. No extremity edema.  CHEST: No abrasions or ecchymosis. Chest symmetric with respirations. No chest wall tenderness. No crepitus. No step offs. Lungs are clear to auscultation bilaterally. No rales, rhonchi, wheezing or stridor.   ABDOMEN: No ecchymosis or abrasions. Soft, nondistended, nontender. No masses or organomegaly.  PELVIC: Pelvis stable. Exquisite tenderness to palpation over the right lateral thigh.  MSK: No gross discolorations or lesions. Tolerates full range of motion of extremities without tenderness with the exception of the right lower extremity.   NEURO: Alert and oriented to person, place, and time. GCS 15. CN II-XII intact. Sensation grossly intact, but she complains of slight d pulling of sensation over the right lower extremity to the knee, and intermittent tingling sensation.  Strength 5/5 in bilateral UE, LE not evaluated at this time due to patient pain.        LABORATORY:   Labs Reviewed   SED RATE - Abnormal       Result Value    Erythrocyte Sedimentation Rate 27 (*)    TRAUMA PANEL    Sodium 139      Potassium 3.7      Chloride 106      Carbon Dioxide, Total 22      Anion Gap 11      Glucose 111      Urea Nitrogen 10      Creatinine 0.59      Calcium 8.9  eGFR by CKD-EPI 2021 >60      WBC 6.35      RBC 4.42      Hemoglobin 13.9      Hematocrit 43      MCV 97      MCH 31.4      MCHC 32.5      Platelet Count 264      RDW-CV 13.6      Alcohol (Ethyl) Negative      Lipase 35      Prothrombin Time Patient 12.4      Prothrombin INR 0.9      Partial Thromboplastin Time 25      Partial Thromboplastin  X Mean        Value: To calculate the PTT X Mean divide PTT value by 29.   SARS-COV-2 (COVID-19) QUALITATIVE RAPID PCR    COVID-19 Coronavirus Qual PCR Specimen Type Nasal swab      COVID-19 Coronavirus Qual PCR Result None detected      COVID-19 Coronavirus Qual PCR Interpretation        Value: This is a negative result. Laboratory testing alone cannot rule out infection, particularly in the presence of clinical risk factors such as symptoms or exposure history.    COVID-19 Qualitative PCR Indication Admission Surveillance     PREGNANCY (HCG), SERUM, QUANT    Pregnancy (HCG), SRM 2     LAB ADD ON ORDER    Lab Test Requested CRP, ESR      Specimen Type/Description Blood      Sample To Use Most recent      Test Request Status Order Processed     C_REACTIVE PROTEIN    C_Reactive Protein 2.9           IMAGING:     ED Wet Read -   XR Pelvis 1 Or 2 W 1 Vw Hip Right   Final Result      Improved alignment of the right hip joint, with the femoral head prosthesis well covered by the prosthetic acetabulum.      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      Moderate osteoarthritis of left hip.      I have personally reviewed the images and agree with the report (or as edited).      XR Knee 1-2 Vw Right   Final Result   No fracture detected.  Alignment is normal.  No soft tissue abnormality. Trace knee joint effusion. Status post total knee arthroplasty with patellar resurfacing, no evidence of hardware loosening or fractures.      I have personally reviewed the images and agree with the report (or as edited).      XR Hip Unilat  W Pelvis 2-3 Vw Right   Final Result   Pelvic ring is intact. Unremarkable left femoral acetabular joint. The medially dislocated prosthetic right femoral head obscures evaluation of the right inferior pubic ramus.      RIGHT HIP:   No fracture detected. Medial  dislocation of the right prosthetic femoral head relative to the prosthetic acetabulum, consistent with component dissociation.  No soft tissue abnormality.      I have personally reviewed the images and agree with the report (or as edited).          Radiology Final Result -   No image results found.  ED COURSE/MEDICAL DECISION MAKING   Autumn Camacho is a 55 year old female presenting to the Emergency Department with a Chief Complaint of hip pain. Vital signs were initially BP 127/70, T36.6, Pulse 72, Resp 18, and SpO2 98%. Physical exam was significant for externally rotated right lower extremity bent at the knee with exquisite tenderness to palpation over the right lateral hip.  Labs showed a mildly elevated ESR. Imaging showed a medial dislocation of the right prosthetic femoral head relative to the prosthetic acetabulum consistent with component dissociation without soft tissue abnormality.  The patient's right hip was reduced under conscious sedation in the ER.  During this conscious sedation she received 100 mcg of ketamine, and 100 mg propofol for sedation.  Repeat x-ray after the hip was reduced showed improved alignment of the right hip joint as well as a chronic periprosthetic fracture in the greater trochanter was in similar alignment when compared to multiple prior exams including 09/20/2020 and 08/08/2020. The patient was provided with discharge instructions regarding her hip movement limitations, provided with an abductor pillow, and instructed to follow-up with Dr. Catalina Gravel, her initial orthopedic surgeon as soon as possible.     Patient was counseled to also follow-up with primary care physician regarding any incidental lab abnormalities or incidental imaging findings. The patient agreed with and expressed understanding of the diagnosis/plan. The patient was given strict return precautions and discharged after being hemodynamically stable throughout the entirety of their stay in the  ED.     This patient was seen and discussed with Attending Physician Dr. Donna Christen.       ED Course as of 08/08/21 1623   Fri Aug 08, 2021   1141 Ortho consulted [MK]   3329 Ortho returned page - will see patient. [MK]   5188 Ortho recommendations: Anterior/Posterior hip precautions, follow up with Dr. Catalina Gravel as soon as possible. Requested infectious labs (CBC, CRP, ESR) in ED.  [MK]      ED Course User Index  [MK] Christeen Douglas                CLINICAL IMPRESSION/DISPOSITION   Clinical Impressions:   [C16.606T] Closed dislocation of right hip, initial encounter         Disposition: Discharge         CRITICAL CARE - ATTENDING ONLY     No critical care       ADDITIONAL INFORMATION REVIEWED  - ATTENDING ONLY   I saw and evaluated the patient. I have reviewed the resident's/fellow's findings and agree.  Briefly, 56 F with a history of R hip replacement presented with a closed R prosthetic hip dislocation, no other injuries. Was neurovascularly intact upon arrival. Ortho consulted who performed bedside closed reduction with sedation administered and monitored by myself and Dr. Edsel Petrin. Ortho recommendations for anterior/posterior hip precautions and abduction pillow provided. Patient stable for discharge.  Marlyn Corporal, MD  Emergency Medicine, Attending Physician        Christeen Douglas  Resident  08/08/21 1854       Durwin Reges, MD  08/11/21 2248

## 2021-08-08 NOTE — ED Notes (Signed)
Pt BIBA, was bending down this morning and felt her R hip pop, did not fall, did not hit her head. Hx of R hip replacement October 2021. Pt took home rx dose of 10mg  at approximately 1010 this morning after injury. Aox4, appropriate.      Orie Fisherman, RN  08/08/21 1114

## 2021-08-08 NOTE — ED Procedure Notes (Signed)
PROCEDURE    Moderate Sedation    Date/Time: 08/08/2021 12:41 PM  Performed by: Rush Farmer, MD  Authorized by: Durwin Reges, MD     Indications:     Intended level of sedation:  Moderate (conscious sedation)  Pre-sedation assessment:     Time since last food or drink:  3 hours    ASA classification: class 2 - patient with mild systemic disease      Neck mobility: normal      Thyromental distance:  3 finger widths    Mallampati score:  I - soft palate, uvula, fauces, pillars visible    Pre-sedation assessments completed and reviewed: airway patency, mental status and respiratory function      History of difficult intubation: no      Pre-sedation assessment completed:  08/08/2021 12:56 PM  Immediate pre-procedure details:     Reassessment: Patient reassessed immediately prior to procedure      Reviewed: vital signs, relevant labs/tests and NPO status      Verified: bag valve mask available, emergency equipment available, intubation equipment available, IV patency confirmed, oxygen available and suction available    Procedure details (see MAR for exact dosages):     Sedation start time:  08/08/2021 1:13 PM    Sedation end time:  08/08/2021 1:41 PM    Total sedation time (minutes):  28    Preoxygenation:  Nasal cannula    Sedation: ketamine and propofol.    Intra-procedure monitoring:  Blood pressure monitoring, cardiac monitor, continuous capnometry, continuous pulse oximetry, frequent LOC assessments and frequent vital sign checks    Intra-procedure events: hypoxia      Intra-procedure management:  Airway repositioning, BVM ventilation and supplemental oxygen  Post-procedure details:     Post-sedation assessment completed:  08/08/2021 1:45 PM    Post-sedation assessments completed and reviewed: airway patency, cardiovascular function, mental status and respiratory function      Specimens recovered:  None    Patient tolerance:  Tolerated well, no immediate complications            Rush Farmer, MD  Resident  08/08/21 1611      Date of Service: 08/08/21  I was present for the entire procedure.   Marlyn Corporal, MD  Emergency Medicine, Attending Physician     Durwin Reges, MD  08/11/21 2240

## 2021-08-08 NOTE — Discharge Instructions (Addendum)
Your labs were seen today in the emergency department for a right hip dislocation.  Upon arrival labs and x-ray were ordered.  Your x-ray showed medial dislocation of your hip as well as a chronic peri-prosthetic fracture of the greater trochanter.The orthopedic surgery service was consulted and your hip dislocation was reduced (put back in place) by an orthopedic surgery physician while you are under conscious sedation.  Repeat x-rays after showed improved alignment of your right hip joint.  Your labs were normal. You should follow-up as soon as possible with Dr. Catalina Gravel, your orthopedic surgeon for further evaluation and care of this.    Discharge Precautions:  - Do not flex hips greater than 90 degrees and avoid crossing your legs.    - Do not cross your legs  - Do not extend your leg behind you.   -Do not lie on stomach.      Reasons to return to the emergency department include recurrence of the symptoms that initial evaluation, instability of your hip when walking, severe pain, any changes to your sensation in your right leg, fever, or chills.    You were seen in the [facility] Emergency Department and determined to be safe for discharge and to be allowed to recover at home. Please be aware that although we feel you are safe to go home that all medical conditions and your health are a dynamic process and we were only able to evaluate you for a short period of time. For this reason we recommend close follow up with your primary care provider as soon as possible to be reassessed and to review all findings and changes to your medical history from this visit as well as to go over any potential incidental findings from this visit that may need additional follow up. You should also be aware that some tests take time to return or are re-evaluated by other providers and therefore may change after you leave here today. Please be sure we have an up to date phone number from you as you may receive a call  about this from [place] in the Emergency Department or your primary care provider for further instructions, if the finding is non-emergent your primary provider can help you navigate any further potential needs that arise. Please use all prescriptions from here or from other providers only as prescribed unless otherwise instructed. If you do not hear from a referral that was placed for you in 48 hours, please call their phone number in order to secure your follow-up appointment.You may also return here or to the nearest Emergency Department if you have any changes to your health or symptoms that concern you.

## 2021-08-08 NOTE — Consults (Signed)
Consults     Patient Name: Autumn Camacho, Autumn Camacho.  Date of Service: August 08, 2021    Patient ID: [id number] Date of Birth: 07/02/1966    Clinician: Towana Badger Facility: [id number]   Location: [facility]         ORTHOPEDIC SURGERY CONSULTATION     HISTORY OF PRESENT ILLNESS:   This is a 55 year old female who has a history of rheumatoid arthritis and a right total hip replacement in 2021 by Dr. Catalina Gravel, also 4 total knee replacements, with the latest revision being done by Dr. Catalina Gravel in 2021 as well.  The patient presents to the ED today after she states that she leaned over in a chair to grab her shoes.  She felt a pop in her hip, along with pain and immediately noticed what she describes as a hip dislocation.  Of note, the patient endorses that she had surgery with Dr. Catalina Gravel in 2021 and about 3 days after surgery she had a greater trochanter fracture that was deemed to be stable and treated nonoperatively.  In the ED, she denies any numbness or tingling.     REVIEW OF SYSTEMS:   Review of systems was done and negative other than noted in HPI.     PAST MEDICAL HISTORY:   1.  Rheumatoid arthritis.  2.  Allergy-induced asthma.  3.  Genital herpes.     PAST SURGICAL HISTORY:   1.  Total hip replacement in 2021.   2.  Four total knee replacements:  - One on the right in 2018.  -- Three on the left,(2018, second one in 2019, by Dr. Catalina Gravel in 2021)   3.  She has also had 4 foot surgeries    MEDICATIONS:  Takes Aleve, upadacitinib, and leflunomide, along with albuterol and steroid inhalers.     ALLERGIES:   SHE IS ALLERGIC TO REMICADE, DESCRIBES ANAPHYLAXIS REACTION.     FAMILY HISTORY:   Denies family history of blood clots or bleeding disorders.  No issues with anesthesia.     SOCIAL HISTORY:   Lives in [place].  Is unemployed.  Denies alcohol, tobacco or drug use.     PHYSICAL EXAMINATION:   GENERAL:  In mild distress secondary to pain.   RESPIRATORY:  Breathing comfortable on room air.  CARDIOVASCULAR:  Warm and  well perfused.  NEUROLOGICAL: Answering questions appropriately.  MUSCULOSKELETAL:   BILATERAL UPPER EXTREMITIES:  No gross deformity noted, nontender to palpation of arms or clavicles.  Range of motion is painless in fingers, wrist, forearm, elbow and shoulder.  Sensation intact to light touch in distribution of axillary, musculocutaneous, radial, median  and ulnar nerves.  Able to demonstrate thumb excision, okay sign, cross fingers.   Strength 5/5 in wrist flexion and extension, elbow flexion and extension and shoulder abduction.  Fingers warm and well perfused.  2+ radial pulse.    RIGHT LOWER EXTREMITY:   Right lower extremity is externally rotated, nontender to palpation in the knee and ankle.   Range of motion painless in toes and ankle, knee.  Deferred at hip.   Sensation intact to light touch in the distribution of SP, DP, tibial, sural, saphenous nerves.  Toes warm and well perfused, 2+ DP pulses.     LEFT LOWE EXTREMITY:  No gross deformity, nontender to palpation over extremity  Painless range of motion at hip, knee, ankle, toes; no discomfort with log roll of hip  Sensation: intact distally to light touch over deep/superficial peroneal, tibial,  saph, sural  Motor: Able to fire against gravity EHL/FHL/GC/TA/Q/HA  Vascular: Toes warm and well perfused, good capillary refill, 2+ DP/PT  Compartments: soft and compressible    X-ray of pelvis and right femur demonstrates a total hip prosthesis that is dislocated anteriorly and inferiorly.  No fractures or other dislocation was noted in these views.     ASSESSMENT AND PLAN:   This is a 54 year old female who sustained a total hip arthroplastic anterior hip dislocation after she was bending over tying her shoes earlier today.  She presents to our institution for further care.     In the Emergency Department, the patient was consented for conscious sedation with propofol and ketamine, which she tolerated appropriately.  Traction was then placed in line  longitudinally with the deformity, and external rotation and gentle traction the hip was heard to pop back in place.  We then got post-reduction x-rays that demonstrated that the hip joint was congruent and aligned.      She was given an abduction brace to prevent future dislocations in the Emergency Department, and pain control was given as well.    #Right total hip arthroplasty dislocation  -WBAT with Anterior posterior hip precautions in abduction brace   -Instructions discussed with patient in detail  -Follow-up with Dr. Catalina Gravel and his team at [facility], where she had her latest surgery.    -Return to ED or call our team if new dislocations or worsening or uncontrolled pain    The patient can follow up with This plan was discussed with chief orthopedic surgery resident, Dr. Lujean Rave, who was in agreement with the physical exam and plan.     Temi Zayla Agar D.Jenetta Downer  PGY2   [facility] of [place] [facility] [place], MD      Date Dictated: 08/08/2021    Date Transcribed: 08/08/2021    DTO/fb/ce   Job #: [id number]

## 2021-08-08 NOTE — Telephone Encounter (Signed)
RETURN CALL: Voicemail - Detailed Message      SUBJECT:  Appointment Request     REASON FOR VISIT: ER discharge - hip replacement out of place - Return  PREFERRED DATE/TIME: as soon as possible  ADDITIONAL INFORMATION: Patient in the ER today for a hip reduction after her replacement popped out of place. CCR transferred to front desk per caller request. TE in case of disconnect. Per caller follow up with Dr. Catalina Gravel is needed as soon as possible.

## 2021-08-11 NOTE — Telephone Encounter (Signed)
Patient scheduled with Dr. Catalina Gravel on 08/11/21 at 9:30am.

## 2021-08-14 ENCOUNTER — Encounter (INDEPENDENT_AMBULATORY_CARE_PROVIDER_SITE_OTHER): Payer: Self-pay | Admitting: Orthopaedic Surgery

## 2021-08-14 ENCOUNTER — Ambulatory Visit (INDEPENDENT_AMBULATORY_CARE_PROVIDER_SITE_OTHER): Payer: Medicare Other | Admitting: Orthopaedic Surgery

## 2021-08-14 VITALS — BP 137/89 | HR 105 | Temp 98.0°F | Resp 18 | Ht 61.0 in | Wt 172.0 lb

## 2021-08-14 DIAGNOSIS — Z96641 Presence of right artificial hip joint: Secondary | ICD-10-CM

## 2021-08-14 DIAGNOSIS — X501XXA Overexertion from prolonged static or awkward postures, initial encounter: Secondary | ICD-10-CM

## 2021-08-14 DIAGNOSIS — S73004D Unspecified dislocation of right hip, subsequent encounter: Secondary | ICD-10-CM

## 2021-08-14 DIAGNOSIS — S73004A Unspecified dislocation of right hip, initial encounter: Secondary | ICD-10-CM

## 2021-08-14 NOTE — Progress Notes (Signed)
Please see the above note by the orthopaedic resident.  I was present with the resident during both the history and physical examination and the results described are corroborated by my own findings.  I discussed the case with the resident, and agree with the findings and plan as documented in that note.    Briefly, Autumn Camacho reports that she was on a low couch and twisted to the right while bending over.  She sustained a posterior/inferior dislocation of the hip, which was reduced in the ED.        Her post-reduction xrays look fine; there is an old trochanteric avulsion fracture which was present in the early postop films, and which has not changed.    We discussed the two main ways a total hip arthroplasty can dislocate:  Extension/external rotation (soccer kick) leading to an anterior dislocation, and flexion/internal rotation/adduction (reaching for toilet paper) leading to a posterior dislocation.  Autumn Camacho did the latter.      PT will help here in terms of strengthening the hip.    Follow up via telemed in 8 weeks.    Rondall Allegra, MD  [facility]  Hip and Knee Arthritis  Professor  Department of Orthopaedics and [place] [employer] [place]

## 2021-08-14 NOTE — Progress Notes (Signed)
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    HPI:    Autumn Camacho was seen in the ED on 10/21 for a R prosthetic anterior hip dislocation, sustained while reaching for an object on the ground. She underwent closed reduction under sedation in the ED and returns today for follow-up.    She reports that at the time of injury she was sitting on a very low, soft couch and reached down and to her right to pick up an object from under her coffee table and felt a pop in her hip with subsequent pain and inability to ambulate. She presented to the ED at that time and was found to have the above noted dislocation.    Since her visit to the ED and subsequent reduction she has been doing very well. She denies pain in the hip and numbness/tingling in the right leg and foot. She has been able to ambulate without assistive devices and do stairs without difficulty. She denies any feeling of instability or giving way in the hip. She denies changes in her gait.    ROS:  The patient reports no tingling, numbness, or weakness in the affected extremity.    EXAM:  Autumn Camacho is oriented to time, place, and person.  Her gait is mildly antalgic.     Right hip: Incision is well healed.  ROM supple 0-100.   No pain with flexion, IR, ER. 5/5 strength in hip flexion, knee extension, knee flexion, EHL, and ankle dorsiflexion/plantarflexion. Sensation intact to light touch in DP, SP, tibial nerve distributions. 2+ DP pulse.    X-rays: Well aligned, well fixed R total hip arthroplasty (cementless).  No evidence of current dislocation or change in alignment compared to pre-dislocation films. She has an old GT fracture that has not changed in alignment.    Labs: CRP 2.9, ESR 27    IMPRESSION:    Doing well One year  s/p R THA with recent anterior hip dislocation, s/p ED reduction with no further dislocation events.    We reviewed her hip precautions in depth today in clinic and discussed ways to modify her movement to prevent deep hip flexion and rotation. This dislocation was most likely due to her extreme motion during the event and we are not concerned for infection or malpositioned components at this time. She may continue activity as tolerated within her precautions.    PLAN:  1.  Referral placed for PT for hip and core strengthening and stabilization  2.  Reviewed hip precautions with her today and discussed ways to modify her activity if needing to pick things up from the ground  3.  Follow-up will be every 2 years with x-rays, she understands the importance of routine radiographic surveillance of joint replacements.    Robet Leu, MD, PGY4  [facility]  [facility]    Patient seen and discussed with Dr. Catalina Gravel.

## 2021-09-17 NOTE — Progress Notes (Signed)
[facility]  [address] [place]  [address]  TEL: [phone]  l  FAX: [phone]        09/18/2021       PRIMARY CARE PROVIDER:  Terressa [place]agi[place] [address]     CONSULTING PROVIDER:  No ref. provider found            PATIENT:       Autumn Camacho    [id number]     Reason for visit:    Rheumatoid arthritis      Last visit:  04/16/2021     HPI:    Diagnosis: rheumatoid arthritis (1999)   Autoimmune Serologies: Anti-CCP pos, RF pos, ANA neg   Medications Used: methotrexate, enbrel, remicade, humira, orencia, xeljanz, rinvoq.     INTERVAL HISTORY:  At her last visit, her condition appeared to be somewhat active with current medications, so she was advised to continue Rinvoq 21m daily, and add leflunomide 243mdaily to see if her symptoms would improve. She was also informed of potential cardiovascular or cancer risks with Rinvoq, and was recommended to follow up with her primary care provider regularly.     She presented to th ED on 08/08/2021 with right hip pain. She had X-rays done of her right pelvis, knee, and hip which showed a "Medial dislocation of the right prosthetic femoral head relative to the prosthetic acetabulum consistent with component dissociation without soft tissue abnormality." She was advised to follow up with her orthopedic surgeon. It was noted that she had a right hip replacement done in 07/2020.     Today, she continues on Rinvoq, leflunomide, and meloxicam as noted above and reports that she has been doing well. She notes improvement to the joint pain in her hands and elbows. She denies any significant morning stiffness. She rates her pain at a 3-4/10 in severity in the morning and a 1-2/10 in severity throughout the day.       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] 04/16/2020     Added automatically from request for surgery [id number]      Failed total left knee replacement ([facility][T84.093A] 02/02/2020     Added automatically from request for surgery [id number]      Knee dislocation, left, initial encounter [S83.105A] 01/16/2020    Rheumatoid arthritis involving right hand ([facility] [place][M06.9] 10/17/2019    Arthritis of carpometacarpal (CMC) joints of both thumbs [M18.0] 10/17/2019    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      leflunomide 20 MG tablet TAKE 1 TABLET DAILY 90 tablet 0    meloxicam 7.5 MG tablet Take 1-2 tablets (7.5-15 mg) by mouth daily. 60 tablet 2    upadacitinib ER (Rinvoq) 15 MG 24 hour tablet Take 1 tablet (15 mg) by mouth every morning. 30 tablet 1       ALLERGIES:  Allergies as of 09/18/2021 - Reviewed 09/18/2021   Allergen Reaction Noted    Fentanyl Other and Unknown 03/20/2020    Hydromorphone BS:WHQPRF/FMBWGYKZ 03/20/2020    Remicade [infliximab] Anaphylaxis 08/17/2018    Codeine Other and Unknown 08/17/2018    Nortriptyline Other and Unknown 04/08/2020       Past Medical History:  Past Medical History:   Diagnosis Date    Chronic right hip pain  02/26/2019    Allergic asthma     Fracture     Bilateral elbow fx    Genital herpes     Rheumatoid arthritis ([place])     Rheumatoid arthritis ([place])     Spinal stenosis     Tooth disorder     MISSING TOOTH UPPER LEFT BACK-MOLAR       Family History:  Family History     Problem (# of Occurrences) Relation (Name,Age of Onset)    Cancer (1) Father    Heart Attack (1) Father    Rheumatoid Arthritis (1) Other           Past Surgical History:  Past Surgical History:   Procedure Laterality Date    foot Bilateral     Arch surgery    KNEE ARTHROPLASTY      LEFT 2018 REPAIR 2019    KNEE ARTHROPLASTY Left 05/01/2020    PR ADENOIDECTOMY PRIMARY <AGE 60      AGE 89  T/A     PR ANES; TOTAL KNEE REPLACEMENT Left 04/01/2020    PR TONSILLECTOMY ONE-HALF <AGE 62      PR UNLISTED PROCEDURE FEMUR/KNEE Bilateral     TKA    PR UNLISTED PROCEDURE FEMUR/KNEE      Left revision TKA     PR UNLISTED PROCEDURE FOOT/TOES Bilateral 2009 R,  2010 L    Reconstructive surgery    PR UNLISTED PROCEDURE PELVIS/HIP JOINT      DOS 07/30/20 R THA    TKA Left 2019    revision from prior TKA       IMMUNIZATION HISTORY:  Immunization History   Administered Date(s) Administered    COVID-19 Pfizer mRNA 12 yrs and older (purple cap) 01/10/2020, 01/27/2020, 06/06/2020    Influenza quadrivalent 07/20/2019    Tdap 05/24/2019    Zoster recombinant (Shingrix) 06/22/2019, 07/20/2019, 08/26/2019       ALLERGIES: Fentanyl, Hydromorphone, Remicade [Infliximab], Codeine, and Nortriptyline    SOCIAL HISTORY:  reports that she quit smoking about 7 years ago. Her smoking use included cigarettes. She has a 20.00 pack-year smoking history. She has never used smokeless tobacco. She reports that she does not currently use alcohol. She reports that she does not use drugs.    FAMILY HISTORY: family history includes Cancer in her father; Heart Attack in her father; Rheumatoid Arthritis in an other family member.    PHYSICAL EXAMINATION:  Vital signs: BP 121/85     Pulse 79    SpO2 95%   General: healthy, alert, no distress  Skin: Skin color, texture,  turgor normal. No rashes or concerning lesions  HEENT: Normocephalic. No masses, lesions, tenderness or abnormalities  Lungs: clear to auscultation  Heart: normal rate, regular rhythm and no murmurs, clicks, or gallops  Abd: soft, non-tender. BS normal. No masses or organomegaly  Ext: Normal, without deformities, edema, or skin discoloration, radial and DP pulses 2+ bilaterally  Neuro:  Grossly normal to observation, gait normal  Musculoskeletal:   Hands: No tenderness or swelling, ulnar deviation in MCP joints bilaterally  Wrists: No tenderness or swelling, full ROM, negative Tinel's sign  Elbows: No tenderness or swelling, deformity in elbows bilaterally  Shoulders: No tenderness or swelling, full ROM  Hips: s/p right hip replacement  Knees: s/p left knee replacement  Ankles: No tenderness or swelling, full ROM    LABS:  Results for orders placed or performed during the hospital encounter of 08/08/21   Trauma Panel   Result Value Ref Range    Sodium 139 135 - 145 meq/L    Potassium 3.7 3.6 - 5.2 meq/L    Chloride 106 98 - 108 meq/L    Carbon Dioxide, Total 22 22 - 32 meq/L    Anion Gap 11 4 - 12    Glucose 111 62 - 125 mg/dL    Urea Nitrogen 10 8 - 21 mg/dL    Creatinine 0.59 0.38 - 1.02 mg/dL    Calcium 8.9 8.9 - 10.2 mg/dL    eGFR by CKD-EPI 2021 >60 >59 mL/min/[1.73_m2]    WBC 6.35 4.3 - 10.0 10*3/uL    RBC 4.42 3.80 - 5.00 10*6/uL    Hemoglobin 13.9 11.5 - 15.5 g/dL    Hematocrit 43 36.0 - 45.0 %    MCV 97 81 - 98 fL    MCH 31.4 27.3 - 33.6 pg    MCHC 32.5 32.2 - 36.5 g/dL    Platelet Count 264 150 - 400 10*3/uL    RDW-CV 13.6 11.0 - 14.5 %    Alcohol (Ethyl) Negative NRN mg/dL    Lipase 35 <70 U/L    Prothrombin Time Patient 12.4 10.7 - 15.6 s    Prothrombin INR 0.9 0.8 - 1.3    Partial Thromboplastin Time 25 22 - 35 s    Partial Thromboplastin X Mean       To calculate the PTT X Mean divide PTT value by 29.   SARS-CoV-2  (COVID-19) Qualitative Rapid PCR   Result Value Ref Range    COVID-19 Coronavirus Qual PCR Specimen Type Nasal swab     COVID-19 Coronavirus Qual PCR Result None detected NDET    COVID-19 Coronavirus Qual PCR Interpretation       This is a negative result. Laboratory testing alone cannot rule out infection, particularly in the presence of clinical risk factors such as symptoms or exposure history.    COVID-19 Qualitative PCR Indication Admission Surveillance    1st Extra Pearl Top   Result Value Ref Range    1st Extra Pearl Top Additional collection tube    Pregnancy (HCG), Serum   Result Value Ref Range    Pregnancy (HCG), SRM 2 <6 m[IU]/mL   1st Extra Orange TopOP   Result Value Ref Range    1st Extra [employer] Additional collection tube    Lab Add On Order   Result Value Ref Range    Lab Test Requested CRP, ESR     Specimen Type/Description Blood     Sample To Use Most recent     Test Request Status  Order Processed    Erythrocyte Sedimentation Rate   Result Value Ref Range    Erythrocyte Sedimentation Rate 27 (H) 0 - 20 mm/h   C-Reactive Protein   Result Value Ref Range    C_Reactive Protein 2.9 0.0 - 10.0 mg/L       IMAGING:  None       IMPRESSION/RECOMMENDATIONS:     Autumn Camacho is a 55 year old female with history of left knee and right hip replacements who came in today for evaluation of rheumatoid arthritis. She continued on Rinvoq, leflunomide, and meloxicam and reported that she had been doing well. She noted improvement to the joint pain in her hands and elbows. She denied any significant morning stiffness. She rated her pain at a 3-4/10 in severity in the morning and a 1-2/10 in severity throughout the day. Exam today showed deformity in hands with no tender or swollen joints.    I discussed that the patient's condition appeared to be stable and ordered surveillance lab tests. Her pain in hands and elbows improved with Rinvoq and leflunomide. I counseled her on treatment plans. I advised her  to maintain her current medications to see if her symptoms would continue to respond. I will reach out to her after reviewing the labs.       Assessment:  1. Rheumatoid arthritis     PLAN:  The following was discussed with the patient.  Recommendations are as follows:  1. Check CMP, CBC, CRP, ESR.  2. Continue Rinvoq 6m po qd.  3. Continue meloxicam 7.560mpo qd prn.  4. Continue leflunomide 2052mo qd.      Follow up: 4-6 months    09/18/2021 @ 2:26 PM - I, Lalisa Kiehn Rossmeissl, Medical Scribe acted as a scrEducation administratord documented the service/procedure performed to the best of my knowledge in the presence of KwaJulaine HuaD who will provide the final review and authentication.  Signed: JohElson Clanedical Scribe

## 2021-09-18 ENCOUNTER — Ambulatory Visit: Payer: No Typology Code available for payment source | Attending: Rheumatology | Admitting: Rheumatology

## 2021-09-18 ENCOUNTER — Encounter (HOSPITAL_BASED_OUTPATIENT_CLINIC_OR_DEPARTMENT_OTHER): Payer: Self-pay | Admitting: Rheumatology

## 2021-09-18 VITALS — BP 121/85 | HR 79

## 2021-09-18 DIAGNOSIS — M0579 Rheumatoid arthritis with rheumatoid factor of multiple sites without organ or systems involvement: Secondary | ICD-10-CM | POA: Insufficient documentation

## 2021-09-18 DIAGNOSIS — Z79899 Other long term (current) drug therapy: Secondary | ICD-10-CM | POA: Insufficient documentation

## 2021-09-18 LAB — COMPREHENSIVE METABOLIC PANEL
ALT (GPT): 16 U/L (ref 7–33)
AST (GOT): 22 U/L (ref 9–38)
Albumin: 3.9 g/dL (ref 3.5–5.2)
Alkaline Phosphatase (Total): 82 U/L (ref 34–121)
Anion Gap: 8 (ref 4–12)
Bilirubin (Total): 0.3 mg/dL (ref 0.2–1.3)
Calcium: 8.9 mg/dL (ref 8.9–10.2)
Carbon Dioxide, Total: 27 meq/L (ref 22–32)
Chloride: 105 meq/L (ref 98–108)
Creatinine: 0.65 mg/dL (ref 0.38–1.02)
Glucose: 71 mg/dL (ref 62–125)
Potassium: 4.2 meq/L (ref 3.6–5.2)
Protein (Total): 7.1 g/dL (ref 6.0–8.2)
Sodium: 140 meq/L (ref 135–145)
Urea Nitrogen: 15 mg/dL (ref 8–21)
eGFR by CKD-EPI 2021: >60 mL/min/{1.73_m2} (ref >59)

## 2021-09-18 LAB — CBC, DIFF
% Basophils: 0 %
% Eosinophils: 0 %
% Immature Granulocytes: 0 %
% Lymphocytes: 66 %
% Monocytes: 8 %
% Neutrophils: 26 %
% Nucleated RBC: 0 %
Absolute Eosinophil Count: 0 10*3/uL (ref 0.00–0.50)
Absolute Lymphocyte Count: 2.22 10*3/uL (ref 1.00–4.80)
Basophils: 0.01 10*3/uL (ref 0.00–0.20)
Hematocrit: 42 % (ref 36.0–45.0)
Hemoglobin: 13.5 g/dL (ref 11.5–15.5)
Immature Granulocytes: 0.01 10*3/uL (ref 0.00–0.05)
MCH: 31.7 pg (ref 27.3–33.6)
MCHC: 32.5 g/dL (ref 32.2–36.5)
MCV: 98 fL (ref 81–98)
Monocytes: 0.28 10*3/uL (ref 0.00–0.80)
Neutrophils: 0.89 10*3/uL — ABNORMAL LOW (ref 1.80–7.00)
Nucleated RBC: 0 10*3/uL
Platelet Count: 265 10*3/uL (ref 150–400)
RBC: 4.26 10*6/uL (ref 3.80–5.00)
RDW-CV: 14.2 % (ref 11.0–14.5)
WBC: 3.41 10*3/uL — ABNORMAL LOW (ref 4.3–10.0)

## 2021-09-18 LAB — C_REACTIVE PROTEIN: C_Reactive Protein: 2 mg/L (ref 0.0–10.0)

## 2021-09-18 LAB — SED RATE: Erythrocyte Sedimentation Rate: 48 mm/h — ABNORMAL HIGH (ref 0–20)

## 2021-09-18 NOTE — Progress Notes (Signed)
I, Ellenie Salome, MD, personally performed the services as described in this documentation. All medical record entries made by the scribe were at my direction and in my presence. I have reviewed the chart and discharge instructions and agree that the record reflects my personal performance and is accurate and complete.

## 2021-09-26 ENCOUNTER — Encounter (INDEPENDENT_AMBULATORY_CARE_PROVIDER_SITE_OTHER): Payer: Self-pay

## 2021-10-02 ENCOUNTER — Other Ambulatory Visit (HOSPITAL_BASED_OUTPATIENT_CLINIC_OR_DEPARTMENT_OTHER): Payer: Self-pay | Admitting: Rheumatology

## 2021-10-02 DIAGNOSIS — M069 Rheumatoid arthritis, unspecified: Secondary | ICD-10-CM

## 2021-10-05 MED ORDER — UPADACITINIB ER 15 MG OR TB24
15.0000 mg | EXTENDED_RELEASE_TABLET | Freq: Every morning | ORAL | 2 refills | Status: DC
Start: 2021-10-05 — End: 2021-12-09

## 2021-10-09 ENCOUNTER — Encounter (INDEPENDENT_AMBULATORY_CARE_PROVIDER_SITE_OTHER): Payer: Self-pay

## 2021-10-09 ENCOUNTER — Telehealth (INDEPENDENT_AMBULATORY_CARE_PROVIDER_SITE_OTHER): Payer: No Typology Code available for payment source | Admitting: Physician Assistant

## 2021-10-09 DIAGNOSIS — S73004A Unspecified dislocation of right hip, initial encounter: Secondary | ICD-10-CM

## 2021-10-09 DIAGNOSIS — Z96641 Presence of right artificial hip joint: Secondary | ICD-10-CM

## 2021-10-09 NOTE — Patient Instructions (Signed)
Call with concerns or health changes.

## 2021-10-09 NOTE — Progress Notes (Signed)
This visit is being conducted over the telephone at the patients request: Yes  Patient gives verbal consent to proceed and knows there may be a copay/deductible: Yes    Time spent with patient/guardian on this telephone visit: 5 minutes    Given the importance of social distancing and other strategies recommended to reduce the risk of COVID-19 transmission, I am providing medical care to this patient via a telephone visit in place of an in person visit at the request of the patient.    Patient's Primary Care Physician: Terressa Koyanagi, MD    Diagnosis: Over one years s/p R total hip arthroplasty (cementless)    Autumn Camacho returns for follow-up today for the R THA.    HPI:    She is doing well.  She had a single hip dislocation from reaching under a table on 08/08/21 with a closed reduction at the ED.  Since that time, she has been cautious and feels great.  No pain or instability.  She has not started PT yet but intends to.  I reiterated that we advise doing so.  She has no other questions or concerns.    On a visual-analogue pain scale, she rates the pain as a 0 out of 10 in severity at rest, and as a 0 out of 10 in severity with weight bearing.    ROS:  The patient reports no tingling, numbness, or weakness in the affected extremity.    EXAM:  Deferred to phone visit    IMPRESSION:    Doing well over one year s/p R THA with a single anterior hip dislocation due to failure to follow dislocation precautions.  She is doing well without recurrence.    PLAN:  1.  Start PT and maintain a daily routine for exercise moving forward..  2.  Antibiotic prophylaxis for dental work and invasive procedures such as colonoscopy were reviewed.  3.  Follow-up will be every 2 years with x-rays, she understands the importance of routine radiographic surveillance of joint replacements.       Norina Buzzard, PA-C  Teaching Associate  Dept of Orthopaedics and Sports Medicine  Joint Replacement/Hip and Knee Arthritis  [place] [facility]  [facility]

## 2021-10-23 ENCOUNTER — Other Ambulatory Visit (HOSPITAL_BASED_OUTPATIENT_CLINIC_OR_DEPARTMENT_OTHER): Payer: Self-pay | Admitting: Rheumatology

## 2021-10-23 DIAGNOSIS — M0579 Rheumatoid arthritis with rheumatoid factor of multiple sites without organ or systems involvement: Secondary | ICD-10-CM

## 2021-10-27 MED ORDER — LEFLUNOMIDE 20 MG OR TABS
ORAL_TABLET | ORAL | 1 refills | Status: DC
Start: 2021-10-27 — End: 2022-04-28

## 2021-11-18 ENCOUNTER — Other Ambulatory Visit (HOSPITAL_BASED_OUTPATIENT_CLINIC_OR_DEPARTMENT_OTHER): Payer: Self-pay | Admitting: Rheumatology

## 2021-11-18 DIAGNOSIS — M0579 Rheumatoid arthritis with rheumatoid factor of multiple sites without organ or systems involvement: Secondary | ICD-10-CM

## 2021-11-19 MED ORDER — MELOXICAM 7.5 MG OR TABS
ORAL_TABLET | ORAL | 6 refills | Status: DC
Start: 2021-11-19 — End: 2022-06-04

## 2021-11-26 ENCOUNTER — Other Ambulatory Visit (INDEPENDENT_AMBULATORY_CARE_PROVIDER_SITE_OTHER): Payer: Self-pay | Admitting: Physician Assistant

## 2021-11-26 DIAGNOSIS — Z96641 Presence of right artificial hip joint: Secondary | ICD-10-CM

## 2021-12-05 ENCOUNTER — Other Ambulatory Visit (HOSPITAL_BASED_OUTPATIENT_CLINIC_OR_DEPARTMENT_OTHER): Payer: Self-pay | Admitting: Rheumatology

## 2021-12-05 DIAGNOSIS — M069 Rheumatoid arthritis, unspecified: Secondary | ICD-10-CM

## 2021-12-09 MED ORDER — RINVOQ 15 MG OR TB24
EXTENDED_RELEASE_TABLET | ORAL | 2 refills | Status: DC
Start: 2021-12-09 — End: 2022-02-26

## 2021-12-09 NOTE — Telephone Encounter (Signed)
This medication is outside of the [facility] Center's protocols. Please sign and close the encounter if you approve:    Rinvoq   RAC JAK Inhibitors Protocol Failed 12/05/2021 04:01 AM   Protocol Details  ANC greater than 1.0 10*3/uL in past 3 months     Neutrophils   Date Value Ref Range Status   09/18/2021 0.89 (L) 1.80 - 7.00 10*3/uL Final   04/16/2021 3.43 1.80 - 7.00 10*3/uL Final   06/27/2020 2.22 1.80 - 7.00 10*3/uL        If this medication is denied please have your staff inform the patient and schedule an appointment if necessary. THANK YOU.

## 2022-01-03 ENCOUNTER — Other Ambulatory Visit (HOSPITAL_BASED_OUTPATIENT_CLINIC_OR_DEPARTMENT_OTHER): Payer: Self-pay

## 2022-02-11 ENCOUNTER — Ambulatory Visit: Payer: No Typology Code available for payment source | Attending: Rheumatology | Admitting: Rheumatology

## 2022-02-11 ENCOUNTER — Other Ambulatory Visit (HOSPITAL_BASED_OUTPATIENT_CLINIC_OR_DEPARTMENT_OTHER): Payer: Self-pay | Admitting: Rheumatology

## 2022-02-11 VITALS — BP 122/91 | HR 87 | Temp 97.0°F | Wt 172.2 lb

## 2022-02-11 DIAGNOSIS — Z79899 Other long term (current) drug therapy: Secondary | ICD-10-CM

## 2022-02-11 DIAGNOSIS — M0579 Rheumatoid arthritis with rheumatoid factor of multiple sites without organ or systems involvement: Secondary | ICD-10-CM | POA: Insufficient documentation

## 2022-02-11 DIAGNOSIS — M48061 Spinal stenosis, lumbar region without neurogenic claudication: Secondary | ICD-10-CM | POA: Insufficient documentation

## 2022-02-11 LAB — LIPID PANEL
Cholesterol/HDL Ratio: 3
HDL Cholesterol: 50 mg/dL (ref >39)
LDL Cholesterol, NIH Equation: 73 mg/dL (ref <130)
Non-HDL Cholesterol: 98 mg/dL (ref 0–159)
Total Cholesterol: 148 mg/dL (ref <200)
Triglyceride: 142 mg/dL (ref <150)

## 2022-02-11 LAB — CBC, DIFF
% Basophils: 1 %
% Eosinophils: 2 %
% Immature Granulocytes: 1 %
% Lymphocytes: 21 %
% Monocytes: 8 %
% Neutrophils: 67 %
% Nucleated RBC: 0 %
Absolute Eosinophil Count: 0.15 10*3/uL (ref 0.00–0.50)
Absolute Lymphocyte Count: 1.31 10*3/uL (ref 1.00–4.80)
Basophils: 0.03 10*3/uL (ref 0.00–0.20)
Hematocrit: 41 % (ref 36.0–45.0)
Hemoglobin: 13.2 g/dL (ref 11.5–15.5)
Immature Granulocytes: 0.03 10*3/uL (ref 0.00–0.05)
MCH: 31.1 pg (ref 27.3–33.6)
MCHC: 32.6 g/dL (ref 32.2–36.5)
MCV: 96 fL (ref 81–98)
Monocytes: 0.47 10*3/uL (ref 0.00–0.80)
Neutrophils: 4.3 10*3/uL (ref 1.80–7.00)
Nucleated RBC: 0 10*3/uL
Platelet Count: 247 10*3/uL (ref 150–400)
RBC: 4.24 10*6/uL (ref 3.80–5.00)
RDW-CV: 14.5 % (ref 11.0–14.5)
WBC: 6.29 10*3/uL (ref 4.3–10.0)

## 2022-02-11 LAB — COMPREHENSIVE METABOLIC PANEL
ALT (GPT): 13 U/L (ref 7–33)
AST (GOT): 17 U/L (ref 9–38)
Albumin: 4 g/dL (ref 3.5–5.2)
Alkaline Phosphatase (Total): 74 U/L (ref 34–121)
Anion Gap: 11 (ref 4–12)
Bilirubin (Total): 0.3 mg/dL (ref 0.2–1.3)
Calcium: 9.3 mg/dL (ref 8.9–10.2)
Carbon Dioxide, Total: 21 meq/L — ABNORMAL LOW (ref 22–32)
Chloride: 109 meq/L — ABNORMAL HIGH (ref 98–108)
Creatinine: 0.66 mg/dL (ref 0.38–1.02)
Glucose: 83 mg/dL (ref 62–125)
Potassium: 4 meq/L (ref 3.6–5.2)
Protein (Total): 7.1 g/dL (ref 6.0–8.2)
Sodium: 141 meq/L (ref 135–145)
Urea Nitrogen: 16 mg/dL (ref 8–21)
eGFR by CKD-EPI 2021: >60 mL/min/{1.73_m2} (ref >59)

## 2022-02-11 LAB — C_REACTIVE PROTEIN: C_Reactive Protein: 2.8 mg/L (ref 0.0–10.0)

## 2022-02-11 LAB — SED RATE: Erythrocyte Sedimentation Rate: 33 mm/h — ABNORMAL HIGH (ref 0–20)

## 2022-02-11 MED ORDER — PREDNISONE 5 MG OR TABS
ORAL_TABLET | ORAL | 0 refills | Status: DC
Start: 2022-02-11 — End: 2022-07-09

## 2022-02-11 NOTE — Progress Notes (Unsigned)
[facility]  [address] [place]  [address]  TEL: [phone]  l  FAX: [phone]        02/11/2022       PRIMARY CARE PROVIDER:  Terressa Koyanagi, [place] [address]     CONSULTING PROVIDER:  No ref. provider found            PATIENT:       Autumn Camacho    [id number]     Reason for visit:    Rheumatoid arthritis      Last visit:  09/18/2021     HPI:    Diagnosis: rheumatoid arthritis (1999)   Autoimmune Serologies: Anti-CCP pos, RF pos, ANA neg   Medications Used: methotrexate, enbrel, remicade, humira, orencia, xeljanz, rinvoq.     INTERVAL HISTORY:  At her last visit, her condition appeared to be stable. Her pain in hands and elbows improved with Rinvoq and leflunomide. She was advised to maintain her current medications to see if her symptoms would continue to respond.     Today, she reports her rheumatoid arthritis symptoms have remained stable. She reports doing well with joint pain in her hands and feet, which are stiff in the morning. Her morning stiffness improves after ~40-60 minutes. She rated her chronic foot pain at an 8/10 in severity. She denies pain when walking. She continues to take Rinvoq 42m daily, meloxicam 7.5 mg daily as needed, and leflunomide 26mdaily. She has been taking Rinvoq for ~3 years, and believes it is helpful for her condition.     She reports recent lower back pain radiating down to her right leg, which she attributed to a stenosis. She has attended physical therapy before, and continues exercises to strength her back muscles. She applies hot and cold cream for pain relief.     She has seen a pain specialist in 2020 to discuss injections. The pain doctor recommended meditation classes, which were not helpful.     CURRENT PROBLEM LIST:  Patient Active Problem List    Diagnosis Date Noted   . Status post revision of total replacement of left knee [Z96.652]  05/10/2020     Added automatically from request for surgery [id number]   . Failed total knee replacement, subsequent encounter [T84.018D, Z96.659] 05/01/2020   . Chronic instability of left knee [M23.52] 04/16/2020     Added automatically from request for surgery [id number]     . [facility] left knee replacement ([facility][T84.093A] 02/02/2020     Added automatically from request for surgery [id number]     . Knee dislocation, left, initial encounter [S83.105A] 01/16/2020   . Rheumatoid arthritis involving right hand ([facility][M06.9] 10/17/2019   . Arthritis of carpometacarpal (CMC) joints of both thumbs [M18.0] 10/17/2019   . 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 Rheumatoid arthritis ([facility][M06.9]    . Rheumatoid arthritis involving multiple sites ([facility][M06.9] 02/26/2019   . Chronic right hip pain [M25.551, G89.29] 02/26/2019   . Osteoarthritis of right knee [M17.11] 05/10/2017   . Osteoarthritis of right hip [M16.11] 10/03/2016        Current Medications:  Outpatient Medications Marked as Taking for the 02/11/22 encounter (Office Visit) with HaJulaine HuaMD   Medication Sig Dispense Refill   . albuterol HFA 108 (90  Base) MCG/ACT inhaler      . fluticasone propionate HFA 220 MCG/ACT inhaler Inhale 1 puff by mouth.     . leflunomide 20 MG tablet TAKE 1 TABLET DAILY 90 tablet 1   . meloxicam 7.5 MG tablet TAKE 1 TO 2 TABLETS DAILY 60 tablet 6   . Rinvoq 15 MG 24 hour tablet TAKE 1 TABLET EVERY MORNING 30 tablet 2       ALLERGIES:  Allergies as of 02/11/2022 - Reviewed 02/11/2022   Allergen Reaction Noted   . Fentanyl Other and Unknown 03/20/2020   . Hydromorphone ZH:YQMVHQ/IONGEXBM 03/20/2020   . Remicade [infliximab] Anaphylaxis 08/17/2018   . Codeine Other and Unknown 08/17/2018   . Nortriptyline Other and Unknown 04/08/2020       Past Medical History:  Past Medical History:   Diagnosis Date   . Chronic right hip pain 02/26/2019   . Allergic asthma    . Fracture     Bilateral  elbow fx   . Genital herpes    . Rheumatoid arthritis ([place])    . Rheumatoid arthritis ([place])    . Spinal stenosis    . Tooth disorder     MISSING TOOTH UPPER LEFT BACK-MOLAR       Family History:  Family History     Problem (# of Occurrences) Relation (Name,Age of Onset)    Cancer (1) Father    Heart Attack (1) Father    Rheumatoid Arthritis (1) Other           Past Surgical History:  Past Surgical History:   Procedure Laterality Date   . foot Bilateral     Arch surgery   . KNEE ARTHROPLASTY      LEFT 2018 REPAIR 2019   . KNEE ARTHROPLASTY Left 05/01/2020   . PR ADENOIDECTOMY PRIMARY <AGE 48      AGE 29  T/A    . PR ANES; TOTAL KNEE REPLACEMENT Left 04/01/2020   . PR TONSILLECTOMY ONE-HALF <AGE 48     . PR UNLISTED PROCEDURE FEMUR/KNEE Bilateral     TKA   . PR UNLISTED PROCEDURE FEMUR/KNEE      Left revision TKA    . PR UNLISTED PROCEDURE FOOT/TOES Bilateral 2009 R,  2010 L    Reconstructive surgery   . PR UNLISTED PROCEDURE PELVIS/HIP JOINT      DOS 07/30/20 R THA   . TKA Left 2019    revision from prior TKA       IMMUNIZATION HISTORY:  Immunization History   Administered Date(s) Administered   . COVID-19 Pfizer mRNA 12 yrs and older (purple cap) 01/10/2020, 01/27/2020, 06/06/2020   . Influenza quadrivalent 07/20/2019   . Tdap 05/24/2019   . Zoster recombinant (Shingrix) 06/22/2019, 07/20/2019, 08/26/2019       ALLERGIES: Fentanyl, Hydromorphone, Remicade [Infliximab], Codeine, and Nortriptyline    SOCIAL HISTORY:  reports that she quit smoking about 8 years ago. Her smoking use included cigarettes. She has a 20.00 pack-year smoking history. She has never used smokeless tobacco. She reports that she does not currently use alcohol. She reports that she does not use drugs.    FAMILY HISTORY: family history includes Cancer in her father; Heart Attack in her father; Rheumatoid Arthritis in an other family member.    PHYSICAL EXAMINATION:  Vital signs: BP (!) 122/91   Pulse 87   Temp 36.1 C (Temporal)   Wt 78.1 kg  (172 lb 3.2 oz)   SpO2 96%   BMI 32.54 kg/m  General: healthy, alert, no distress  Skin: Skin color, texture, turgor normal. No rashes or concerning lesions  HEENT: Normocephalic. No masses, lesions, tenderness or abnormalities  Lungs: clear to auscultation  Heart: normal rate, regular rhythm and no murmurs, clicks, or gallops  Abd: soft, non-tender. BS normal. No masses or organomegaly  Ext: Normal, without deformities, edema, or skin discoloration, radial and DP pulses 2+ bilaterally  Neuro:  Grossly normal to observation, gait normal  Musculoskeletal: ***  Hands: No tenderness or swelling in MCP or PIP joints   Wrists: No tenderness or swelling, full ROM, negative Tinel's sign  Elbows: No tenderness or swelling, full ROM, no tenderness in lateral epicondyle  Shoulders: No tenderness or swelling, full ROM  Hips: Full ROM, no tenderness in greater trochanter  Knees: No tenderness or swelling, full ROM, no crepitus.  Ankles: No tenderness or swelling, full ROM  Feet: No tenderness or swelling in MTP joints  C-spine: Full ROM, no tenderness in spinous process or paraspinal muscles  L-Spine: Full ROM, no tenderness in spinous process or paraspinal muscles, no tenderness in SI joints, negative FABER test, negative straight leg raising test  Fibromyalgia tender points: Tenderness over multiple fibromyalgia tender points including anterior neck, second rib, occiput, trapezius, supraspinatus    LABS:  Results for orders placed or performed in visit on 09/18/21   Comprehensive Metabolic Panel   Result Value Ref Range    Sodium 140 135 - 145 meq/L    Potassium 4.2 3.6 - 5.2 meq/L    Chloride 105 98 - 108 meq/L    Carbon Dioxide, Total 27 22 - 32 meq/L    Anion Gap 8 4 - 12    Glucose 71 62 - 125 mg/dL    Urea Nitrogen 15 8 - 21 mg/dL    Creatinine 0.65 0.38 - 1.02 mg/dL    Protein (Total) 7.1 6.0 - 8.2 g/dL    Albumin 3.9 3.5 - 5.2 g/dL    Bilirubin (Total) 0.3 0.2 - 1.3 mg/dL    Calcium 8.9 8.9 - 10.2 mg/dL    AST  (GOT) 22 9 - 38 U/L    Alkaline Phosphatase (Total) 82 34 - 121 U/L    ALT (GPT) 16 7 - 33 U/L    eGFR by CKD-EPI 2021 >60 >59 mL/min/[1.73_m2]   CBC with Diff   Result Value Ref Range    WBC 3.41 (L) 4.3 - 10.0 10*3/uL    RBC 4.26 3.80 - 5.00 10*6/uL    Hemoglobin 13.5 11.5 - 15.5 g/dL    Hematocrit 42 36.0 - 45.0 %    MCV 98 81 - 98 fL    MCH 31.7 27.3 - 33.6 pg    MCHC 32.5 32.2 - 36.5 g/dL    Platelet Count 265 150 - 400 10*3/uL    RDW-CV 14.2 11.0 - 14.5 %    % Neutrophils 26 %    % Lymphocytes 66 %    % Monocytes 8 %    % Eosinophils 0 %    % Basophils 0 %    % Immature Granulocytes 0 %    Neutrophils 0.89 (L) 1.80 - 7.00 10*3/uL    Absolute Lymphocyte Count 2.22 1.00 - 4.80 10*3/uL    Monocytes 0.28 0.00 - 0.80 10*3/uL    Absolute Eosinophil Count 0.00 0.00 - 0.50 10*3/uL    Basophils 0.01 0.00 - 0.20 10*3/uL    Immature Granulocytes 0.01 0.00 - 0.05 10*3/uL    Nucleated RBC 0.00 0.00  10*3/uL    % Nucleated RBC 0 %   C-Reactive Protein   Result Value Ref Range    C_Reactive Protein 2.0 0.0 - 10.0 mg/L   Erythrocyte Sedimentation Rate   Result Value Ref Range    Erythrocyte Sedimentation Rate 48 (H) 0 - 20 mm/h       IMAGING:  None    IMPRESSION/RECOMMENDATIONS:   Autumn Camacho is a 56 year old female with history of left knee and right hip replacements who came in today for evaluation of rheumatoid arthritis. She reported her rheumatoid arthritis symptoms have been stable with minimal joint pain in her hands and feet. She rated her chronic foot pain at an 8/10 in severity, but denied pain when walking. She had morning stiffness which improved after ~40-60 minutes. She continued to take Rinvoq 49m daily, leflunomide 294mdaily, and meloxicam 7.30m104maily as needed. She had been taking Rinvoq for ~3 years. She reported lower back pain that radiated down her right leg, which she attributed to a stenosis. She had attended physical therapy before, and continued exercises at home to strengthen her back  muscles. She applied hot and cold cream for pain relief. She had followed a pain specialist during the COVID-19 pandemic who recommended meditation classes, which were not helpful. Labs revealed ***. Exam today showed ***.    I discussed that the patient's condition appeared to be stable, and ordered surveillance lab tests. I counseled her on treatment plans. I advised her to continue Rinvoq 130m1mily, leflunomide 20mg26mly, and meloxicam 7.30mg d48my as needed. I advised her to start a course of prednisone to see if her pain improves. I will consider starting other TNF inhibitors depending on her blood test results and response to prednisone. I recommended she return to her previous pain doctor to discuss receiving a steroid injection for her lower back pain. I will reach out to her after reviewing the labs.     Assessment:  1. Rheumatoid arthritis involving multiple sites with positive rheumatoid factor  2. Long-term use of high-risk medication  3. Spinal stenosis of lumbar region, unspecified whether neurogenic claudication present    PLAN:  The following was discussed with the patient. Recommendations are as follows:  1. Labs today, as shown below  2. Take prednisone 20mg p62mily for 4 days, 130mg po48mly for 4 days, 10mg po 26my for 4 days, 30mg po da33m for 4 days,  3. Continue Rinvoq 130mg po qd1m. Continue meloxicam 7.30mg po qd p19m  5. Continue leflunomide 20mg po qd. 12m- Comprehensive Metabolic Panel  - CBC with Diff  - C-Reactive Protein  - Erythrocyte Sedimentation Rate  - Lipid Panel; Future    All the patient's questions were answered.    Follow up: 3 months    02/11/2022 @ 4:12 PM - I, Djelli Berisha acted as a scribe and doEducation administratored the service/procedure performed to the best of my knowledge in the presence of Kwanghoon HanJulaine Hua provide the final review and authentication.  Signed: Djelli BerishBruna Potter

## 2022-02-12 ENCOUNTER — Encounter (HOSPITAL_BASED_OUTPATIENT_CLINIC_OR_DEPARTMENT_OTHER): Payer: Self-pay | Admitting: Rheumatology

## 2022-02-12 NOTE — Progress Notes (Signed)
I, Rosaura Bolon, MD, personally performed the services as described in this documentation. All medical record entries made by the scribe were at my direction and in my presence. I have reviewed the chart and discharge instructions and agree that the record reflects my personal performance and is accurate and complete.

## 2022-02-24 ENCOUNTER — Ambulatory Visit: Payer: No Typology Code available for payment source | Attending: Pain Management | Admitting: Pain Management

## 2022-02-24 ENCOUNTER — Encounter (HOSPITAL_BASED_OUTPATIENT_CLINIC_OR_DEPARTMENT_OTHER): Payer: Self-pay | Admitting: Pain Management

## 2022-02-24 VITALS — BP 109/77 | HR 94 | Temp 97.8°F | Ht 61.0 in | Wt 169.0 lb

## 2022-02-24 DIAGNOSIS — M5441 Lumbago with sciatica, right side: Secondary | ICD-10-CM | POA: Insufficient documentation

## 2022-02-24 DIAGNOSIS — M5431 Sciatica, right side: Secondary | ICD-10-CM | POA: Insufficient documentation

## 2022-02-24 DIAGNOSIS — M1611 Unilateral primary osteoarthritis, right hip: Secondary | ICD-10-CM | POA: Insufficient documentation

## 2022-02-24 DIAGNOSIS — G8929 Other chronic pain: Secondary | ICD-10-CM | POA: Insufficient documentation

## 2022-02-24 DIAGNOSIS — M25551 Pain in right hip: Secondary | ICD-10-CM | POA: Insufficient documentation

## 2022-02-24 NOTE — Progress Notes (Deleted)
Last imaging 2018   -scoliosis and stenosis of lower back   -protroding disk     Back pain, goes down the R leg. Less fire and more numbness. Radiates towards back and lateral leg     Less stable, slower mobility, prevents work     Tried: PT, continues to do exercises, icy hot, prednisone has helped as well   Gabapentin causes dizziness   Has received steroid injections in the past with good relief     RA: rivoq, leflodumide, meloxicam,     Worsening: standing

## 2022-02-24 NOTE — Patient Instructions (Addendum)
Lumbar MRI ordered    2.   PT to consider     3.  Duloxetine to consider in the future

## 2022-02-24 NOTE — Progress Notes (Signed)
Review of Systems   Constitutional: Positive for activity change and fatigue.   HENT: Positive for congestion and sinus pressure.    Eyes: Negative.    Respiratory: Negative.    Cardiovascular: Negative.    Gastrointestinal: Positive for abdominal distention, constipation and diarrhea.   Endocrine: Negative.    Genitourinary: Positive for enuresis.   Musculoskeletal: Positive for arthralgias, back pain, gait problem, joint swelling, myalgias, neck pain and neck stiffness.   Skin: Positive for wound.   Allergic/Immunologic: Positive for environmental allergies and immunocompromised state.   Neurological: Positive for weakness and numbness.   Hematological: Bruises/bleeds easily.   Psychiatric/Behavioral: Negative.

## 2022-02-25 NOTE — Progress Notes (Addendum)
Progress Note     Patient Name: Autumn Camacho, Autumn Camacho.  Date of Service: Feb 24, 2022    Patient ID: [id number] Date of Birth: February 08, 1966    Clinician: Ardith Dark, MD Facility: [id number]   Location: [facility]         PAIN CLINIC FOLLOWUP     Autumn Camacho is a very pleasant 56 year old female who we last saw in 11/2019.  The patient has a history of low back pain, history of multiple joints including right hip, left knee, and also small joints in hands and feet, and she has history of rheumatoid arthritis.     The patient participated in our mindfulness-based stress reduction program and she still uses some of mindfulness skills in her everyday life.  She believes that it is very useful for quieting her mind, for improving her mood, but not so much for reducing the pain level, the patient tells [place] today     Since we saw Autumn Camacho, she had right hip replacement, which went well, and then she had revision of left knee replacement that eventually also went well; however, now she came with concerns of right low back pain with radiation into right lower extremity, so this is her predominant pain manifestation at this point.     She takes meloxicam 7.5 mg twice a day.  She currently is on a tapering course of prednisone.  She has only a few days left.  She is working with Rheumatology on disease-modifying treatments. At this point, she is on leflunomide, but apparently some other strategies will be considered as well.     Autumn Camacho tells [place] that she is no longer taking gabapentin.     She tells [place] that she has not done physical therapy for low back pain in a long time.     She tells [place] that she does not use any assistive devices, although she may consider at some point using a cane.     Looking at her imaging studies, she has not had any lumbar spine imaging in a long time, and so that we have discussed this issue and then we have made appropriate referral for this.     PHYSICAL EXAMINATION:  GENERAL:  She is alert and  oriented, in no distress.  PSYCHIATRIC:  Affect was good.  She was pleasant as always.  RESPIRATIONS:  Unlabored.  CARDIAC:  No dyspnea.  No JVD distention.  MUSCULOSKELETAL:  Ambulates independently with a somewhat antalgic gait.  There are deformities in her hands and toes secondary to rheumatoid arthritis.  Her strength is symmetric in lower extremities.  Good reflexes in knees and ankles.  No sensory decrease.  She does have positive straight leg raising test, however, on the right side.  Her sciatic pain typically does not radiate more distally than her right knee, so that it is essentially right low back pain and then radiation into right lower extremity, mostly into her right anterolateral thigh, but again no more distally than this.     ASSESSMENT AND PLAN:  Autumn Camacho was seen in followup.  We have not seen Autumn Camacho for 2 years now, and overall she seems to be doing reasonably well with being now status post right hip and left knee replacement, revision on the left knee, but doing well status post surgeries; however, right low back and right leg pain and sciatic-type pain continue to be troublesome.     RECOMMENDATIONS:  1.  Lumbar spine MRI has been ordered  and I think that we need to wait for the results and then decide on further steps.  2.  PT to consider after MRI has been completed.  3.  Possibly lumbar epidural steroid injection to be considered depending on findings on the imaging.  4.  Duloxetine for pain management and not for mood effects of this medication to be considered in the future also.    Clinical encounter time was 30 minutes.     Follow up in 12 weeks.  However, we asked Autumn Camacho to contact us via MyChart when her lumbar spine MRI has been completed.                                                                             Ardith Dark, MD      Date Dictated: 02/24/2022 1:17:09 PM    Date Transcribed: 02/25/2022    IS/jdmt   Job #: [id number]

## 2022-02-26 ENCOUNTER — Other Ambulatory Visit (HOSPITAL_BASED_OUTPATIENT_CLINIC_OR_DEPARTMENT_OTHER): Payer: Self-pay | Admitting: Rheumatology

## 2022-02-26 DIAGNOSIS — G8929 Other chronic pain: Secondary | ICD-10-CM | POA: Insufficient documentation

## 2022-02-26 DIAGNOSIS — M069 Rheumatoid arthritis, unspecified: Secondary | ICD-10-CM

## 2022-02-27 MED ORDER — UPADACITINIB ER 15 MG OR TB24
15.0000 mg | EXTENDED_RELEASE_TABLET | Freq: Every morning | ORAL | 2 refills | Status: DC
Start: 2022-02-27 — End: 2022-07-09

## 2022-03-11 ENCOUNTER — Encounter (HOSPITAL_BASED_OUTPATIENT_CLINIC_OR_DEPARTMENT_OTHER): Payer: Self-pay | Admitting: Rheumatology

## 2022-03-11 ENCOUNTER — Ambulatory Visit
Admission: RE | Admit: 2022-03-11 | Discharge: 2022-03-11 | Disposition: A | Discharge status: Home / Self Care | Payer: No Typology Code available for payment source | Attending: Internal Medicine | Admitting: Internal Medicine

## 2022-03-11 ENCOUNTER — Encounter (HOSPITAL_BASED_OUTPATIENT_CLINIC_OR_DEPARTMENT_OTHER): Payer: Self-pay | Admitting: Pain Management

## 2022-03-11 DIAGNOSIS — M4716 Other spondylosis with myelopathy, lumbar region: Secondary | ICD-10-CM | POA: Insufficient documentation

## 2022-03-11 DIAGNOSIS — M5431 Sciatica, right side: Secondary | ICD-10-CM | POA: Insufficient documentation

## 2022-03-17 ENCOUNTER — Other Ambulatory Visit (HOSPITAL_BASED_OUTPATIENT_CLINIC_OR_DEPARTMENT_OTHER): Payer: Self-pay | Admitting: Pain Medicine

## 2022-03-17 DIAGNOSIS — M5431 Sciatica, right side: Secondary | ICD-10-CM

## 2022-03-31 ENCOUNTER — Encounter (HOSPITAL_BASED_OUTPATIENT_CLINIC_OR_DEPARTMENT_OTHER): Payer: Self-pay

## 2022-04-20 NOTE — Telephone Encounter (Signed)
Please schedule a follow-up appointment to discuss her treatment for RA.

## 2022-04-26 ENCOUNTER — Other Ambulatory Visit (HOSPITAL_BASED_OUTPATIENT_CLINIC_OR_DEPARTMENT_OTHER): Payer: Self-pay | Admitting: Rheumatology

## 2022-04-26 DIAGNOSIS — M0579 Rheumatoid arthritis with rheumatoid factor of multiple sites without organ or systems involvement: Secondary | ICD-10-CM

## 2022-04-27 ENCOUNTER — Encounter (HOSPITAL_BASED_OUTPATIENT_CLINIC_OR_DEPARTMENT_OTHER): Payer: Self-pay | Admitting: Pain Management

## 2022-04-28 ENCOUNTER — Other Ambulatory Visit (HOSPITAL_BASED_OUTPATIENT_CLINIC_OR_DEPARTMENT_OTHER): Payer: Self-pay | Admitting: Pain Management

## 2022-04-28 DIAGNOSIS — M792 Neuralgia and neuritis, unspecified: Secondary | ICD-10-CM

## 2022-04-28 MED ORDER — GABAPENTIN 300 MG OR CAPS
ORAL_CAPSULE | ORAL | 2 refills | Status: DC
Start: 2022-04-28 — End: 2022-07-09

## 2022-04-28 MED ORDER — LEFLUNOMIDE 20 MG OR TABS
ORAL_TABLET | ORAL | 0 refills | Status: DC
Start: 2022-04-28 — End: 2022-07-24

## 2022-04-29 ENCOUNTER — Other Ambulatory Visit (HOSPITAL_BASED_OUTPATIENT_CLINIC_OR_DEPARTMENT_OTHER): Payer: Self-pay | Admitting: Pain Management

## 2022-04-29 DIAGNOSIS — M792 Neuralgia and neuritis, unspecified: Secondary | ICD-10-CM

## 2022-04-29 MED ORDER — GABAPENTIN 300 MG OR CAPS
ORAL_CAPSULE | ORAL | 0 refills | Status: DC
Start: 2022-04-29 — End: 2022-07-09

## 2022-05-06 ENCOUNTER — Other Ambulatory Visit (HOSPITAL_BASED_OUTPATIENT_CLINIC_OR_DEPARTMENT_OTHER): Payer: Self-pay | Admitting: Rheumatology

## 2022-05-06 ENCOUNTER — Other Ambulatory Visit (HOSPITAL_BASED_OUTPATIENT_CLINIC_OR_DEPARTMENT_OTHER): Payer: Self-pay

## 2022-05-06 ENCOUNTER — Encounter (HOSPITAL_BASED_OUTPATIENT_CLINIC_OR_DEPARTMENT_OTHER): Payer: Self-pay | Admitting: Rheumatology

## 2022-05-06 ENCOUNTER — Ambulatory Visit: Payer: No Typology Code available for payment source | Attending: Rheumatology | Admitting: Rheumatology

## 2022-05-06 VITALS — BP 121/84 | HR 106 | Temp 98.6°F

## 2022-05-06 DIAGNOSIS — Z79899 Other long term (current) drug therapy: Secondary | ICD-10-CM | POA: Insufficient documentation

## 2022-05-06 DIAGNOSIS — M0579 Rheumatoid arthritis with rheumatoid factor of multiple sites without organ or systems involvement: Secondary | ICD-10-CM | POA: Insufficient documentation

## 2022-05-06 LAB — COMPREHENSIVE METABOLIC PANEL
ALT (GPT): 17 U/L (ref 7–33)
AST (GOT): 20 U/L (ref 9–38)
Albumin: 4.1 g/dL (ref 3.5–5.2)
Alkaline Phosphatase (Total): 80 U/L (ref 34–121)
Anion Gap: 8 (ref 4–12)
Bilirubin (Total): 0.4 mg/dL (ref 0.2–1.3)
Calcium: 9 mg/dL (ref 8.9–10.2)
Carbon Dioxide, Total: 23 meq/L (ref 22–32)
Chloride: 110 meq/L — ABNORMAL HIGH (ref 98–108)
Creatinine: 0.52 mg/dL (ref 0.38–1.02)
Glucose: 73 mg/dL (ref 62–125)
Potassium: 4.3 meq/L (ref 3.6–5.2)
Protein (Total): 6.9 g/dL (ref 6.0–8.2)
Sodium: 141 meq/L (ref 135–145)
Urea Nitrogen: 12 mg/dL (ref 8–21)
eGFR by CKD-EPI 2021: >60 mL/min/{1.73_m2} (ref >59)

## 2022-05-06 LAB — CBC, DIFF
% Basophils: 1 %
% Eosinophils: 0 %
% Immature Granulocytes: 0 %
% Lymphocytes: 32 %
% Monocytes: 10 %
% Neutrophils: 57 %
% Nucleated RBC: 0 %
Absolute Eosinophil Count: 0 10*3/uL (ref 0.00–0.50)
Absolute Lymphocyte Count: 1.51 10*3/uL (ref 1.00–4.80)
Basophils: 0.03 10*3/uL (ref 0.00–0.20)
Hematocrit: 42 % (ref 36.0–45.0)
Hemoglobin: 13.7 g/dL (ref 11.5–15.5)
Immature Granulocytes: 0.01 10*3/uL (ref 0.00–0.05)
MCH: 32.2 pg (ref 27.3–33.6)
MCHC: 32.9 g/dL (ref 32.2–36.5)
MCV: 98 fL (ref 81–98)
Monocytes: 0.48 10*3/uL (ref 0.00–0.80)
Neutrophils: 2.77 10*3/uL (ref 1.80–7.00)
Nucleated RBC: 0 10*3/uL
Platelet Count: 239 10*3/uL (ref 150–400)
RBC: 4.26 10*6/uL (ref 3.80–5.00)
RDW-CV: 13.2 % (ref 11.0–14.5)
WBC: 4.8 10*3/uL (ref 4.3–10.0)

## 2022-05-06 LAB — HEPATITIS C AB WITH REFLEX PCR: Hepatitis C Antibody w/Rflx PCR: NONREACTIVE

## 2022-05-06 LAB — HEPATITIS B BATTERY (HBSAG W/RFLX PCR, ANTI-HBS, ANIT-HBC)
Hepatitis B Core Ab: NONREACTIVE
Hepatitis B Surf Antibody Intl Units: <8 m[IU]/mL
Hepatitis B Surface Ab: NONREACTIVE
Hepatitis B Surface Antigen w/Reflex: NONREACTIVE

## 2022-05-06 LAB — SED RATE: Erythrocyte Sedimentation Rate: 29 mm/h — ABNORMAL HIGH (ref 0–20)

## 2022-05-06 LAB — C_REACTIVE PROTEIN: C_Reactive Protein: 2.2 mg/L (ref 0.0–10.0)

## 2022-05-06 NOTE — Progress Notes (Signed)
[facility]  [address] [place]  [address]  TEL: [phone]  l  FAX: [phone]        05/06/2022       PRIMARY CARE PROVIDER:  Terressa Koyanagi, [place] [address]     CONSULTING PROVIDER:  No ref. provider found            PATIENT:       Autumn Camacho    [id number]     Reason for visit:    Rheumatoid arthritis      Last visit:  02/11/2022     HPI:    Diagnosis: rheumatoid arthritis (1999)   Autoimmune Serologies: Anti-CCP pos, RF pos, ANA neg   Medications Used: methotrexate, enbrel, remicade, humira, orencia, xeljanz, rinvoq.     INTERVAL HISTORY:  At her last visit, her condition appeared to be somewhat active, and requested surveillance lab tests. Her toe pain may be related to both RA and mechanical issues due to toe deformities. I advised her to continue Rinvoq and leflunomide and take a course of prednisone to see if her pain improves. I recommended she return to her previous pain doctor to discuss receiving a steroid injection for her lower back pain.     Today, she reports continued back pain and plans to get an epidural steroid injection in her lower back soon. She endorses pain in her hands, feet, and elbows. She rates her elbow and hand pain as a 4/10. She reports sores on her feet which she attributes to her toe deformity. The ulcers on the bottom of her right foot occasionally leaks pus. She has been taking Rinvoq for 4 years and it has been losing efficacy over time. She is also taking leflunomide and meloxicam. Her back pain and foot swelling was greatly improved when she was on prednisone. She has had both feet rebuilt about 13 years ago and both knees replaced.    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]  05/10/2020     Added automatically from request for surgery [id number]        Failed total knee replacement, subsequent encounter [T84.018D, Z96.659] 05/01/2020    Chronic instability of left knee [M23.52] 04/16/2020     Added automatically from request for surgery [id number]        Failed total left knee replacement ([place]) [T84.093A] 02/02/2020     Added automatically from request for surgery [id number]        Knee dislocation, left, initial encounter [S83.105A] 01/16/2020    Rheumatoid arthritis involving right hand ([place]) [M06.9] 10/17/2019    Arthritis of carpometacarpal (CMC) joints of both thumbs [M18.0] 10/17/2019    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      gabapentin 300 MG capsule Start from 300 mg in the morning, increase every 3 days up to a dose of 300 mg in the morning, 300 mg in the afternoon and 600 mg at bedtime. 30 capsule 0    gabapentin 300 MG capsule Take 300 mg at bedtime. Increase weekly by 300 mg up to: 300 mg during the day and 600 mg at bedtime. 90 capsule 2    leflunomide 20 MG tablet TAKE 1 TABLET DAILY 90 tablet 0    meloxicam 7.5 MG tablet TAKE 1 TO 2 TABLETS DAILY 60 tablet 6    predniSONE 5 MG tablet Take prednisone '20mg'$  po daily for 4 days, '15mg'$  po daily for 4 days, '10mg'$  po daily for 4 days, '5mg'$  po daily for 4 days 40 tablet 0    upadacitinib ER (Rinvoq) 15 MG 24 hour tablet Take 1 tablet (15 mg)  by mouth every morning. 30 tablet 2    valACYclovir 1 g tablet          ALLERGIES:  Allergies as of 05/06/2022 - Reviewed 05/06/2022   Allergen Reaction Noted    Fentanyl Other and Unknown 03/20/2020    Hydromorphone ZG:YFVCBS/WHQPRFFM 03/20/2020    Remicade [infliximab] Anaphylaxis 08/17/2018    Codeine Other and Unknown 08/17/2018    Nortriptyline Other and Unknown 04/08/2020       Past Medical History:  Past Medical History:   Diagnosis Date    Chronic right hip pain 02/26/2019    Allergic asthma     Fracture     Bilateral elbow fx    Genital herpes     Rheumatoid arthritis ([place])     Rheumatoid arthritis ([place])     Spinal stenosis     Tooth disorder     MISSING TOOTH UPPER LEFT BACK-MOLAR       Family History:  Family History       Problem (# of Occurrences) Relation (Name,Age of Onset)    Cancer (1) Father    Heart Attack (1) Father    Rheumatoid Arthritis (1) Other             Past Surgical History:  Past Surgical History:   Procedure Laterality Date    foot Bilateral     Arch surgery    KNEE ARTHROPLASTY      LEFT 2018 REPAIR 2019    KNEE ARTHROPLASTY Left 05/01/2020    PR ADENOIDECTOMY PRIMARY <AGE 88      AGE 84  T/A     PR ANES; TOTAL KNEE REPLACEMENT Left 04/01/2020    PR TONSILLECTOMY ONE-HALF <AGE 88      PR UNLISTED PROCEDURE FEMUR/KNEE Bilateral     TKA    PR UNLISTED PROCEDURE FEMUR/KNEE      Left revision TKA     PR UNLISTED PROCEDURE FOOT/TOES Bilateral 2009 R,  2010 L    Reconstructive surgery    PR UNLISTED PROCEDURE PELVIS/HIP JOINT      DOS 07/30/20 R THA    TKA Left 2019    revision from prior TKA       IMMUNIZATION HISTORY:  Immunization History   Administered Date(s) Administered    COVID-19 Pfizer mRNA monovalent 12 yrs and older (purple cap) 01/10/2020, 01/27/2020, 06/06/2020    Influenza quadrivalent 07/20/2019  Tdap 05/24/2019    Zoster recombinant (Shingrix) 06/22/2019, 07/20/2019, 08/26/2019       ALLERGIES: Fentanyl, Hydromorphone, Remicade [Infliximab], Codeine, and  Nortriptyline    SOCIAL HISTORY:  reports that she quit smoking about 8 years ago. Her smoking use included cigarettes. She has a 20.00 pack-year smoking history. She has never used smokeless tobacco. She reports that she does not currently use alcohol. She reports that she does not use drugs.    FAMILY HISTORY: family history includes Cancer in her father; Heart Attack in her father; Rheumatoid Arthritis in an other family member.    PHYSICAL EXAMINATION:  Vital signs: BP 121/84   Pulse (!) 106   Temp 37 C (Temporal)   SpO2 95%   General: healthy, alert, no distress  Skin: Skin color, texture, turgor normal. No rashes or concerning lesions  HEENT: Normocephalic. No masses, lesions, tenderness or abnormalities  Lungs: clear to auscultation  Heart: normal rate, regular rhythm and no murmurs, clicks, or gallops  Abd: soft, non-tender. BS normal. No masses or organomegaly  Ext: Normal, without deformities, edema, or skin discoloration, radial and DP pulses 2+ bilaterally  Neuro:  Grossly normal to observation, gait normal  Musculoskeletal:   Hands: No tenderness or swelling, ulnar deviation in MCP joints bilaterally  Wrists: No tenderness or swelling, full ROM, negative Tinel's sign  Elbows: No tenderness or swelling, deformity in elbows bilaterally  Shoulders: No tenderness or swelling, full ROM  Hips: s/p right hip replacement  Knees: s/p bilateral knee replacement  Ankles: Tenderness in right ankle  Feet: Tenderness in MTPs (right), multiple toe deformity in both feet    LABS:  Results for orders placed or performed in visit on 02/11/22   Lipid Panel   Result Value Ref Range    Total Cholesterol 148 <200 mg/dL    Triglyceride 142 <150 mg/dL    HDL Cholesterol 50 >39 mg/dL    Non-HDL Cholesterol 98 0 - 159 mg/dL    LDL Cholesterol, NIH Equation 73 <130 mg/dL    Cholesterol/HDL Ratio 3.0     Lipid Panel, Additional Info. (NOTE)        IMAGING:  03/11/2022 - MRI L Spine wo Contrast  IMPRESSION  1.  Multilevel lumbar  spondylosis greatest at L4-L5 with moderate spinal stenosis and severe right neuroforaminal stenosis in the setting of grade 1 anterolisthesis with uncovering of the disc, hypertrophy of ligamentum flavum, and severe facet arthropathy.  2.  Moderate spinal and moderate to severe right foraminal stenosis at L2-L3 with disc bulge with right paracentral extrusion and facet arthropathy.  3.  Moderate to severe right and severe left foraminal stenosis at L5-S1.      IMPRESSION/RECOMMENDATIONS:     Autumn Camacho is a 56 year old female with history of bilateral knee and right hip replacements who came in today for evaluation of rheumatoid arthritis. She reported back pain for which she planned to get an epidural steroid injection in the near future. She endorsed pain in hands, feet, and elbows. She rated her elbow and hand pain as a 4/10. She had sores on her feet which she attributed to her toe deformity. She was taking Rinvoq for 4 years and felt it was was losing efficacy. She was also on leflunomide and meloxicam. She reported her back pain and foot swelling was greatly improved with prednisone. Exam today showed tenderness in left elbow, right ankle and right foot.    I discussed that her condition appeared to be somewhat  active and ordered surveillance lab tests. Her pain in her back was likely to be related to spinal stenosis rather than rheumatoid arthritis. I counseled her on treatment plans. I advised her to take Cimzia, a TNF inhibitor, to see if her symptoms improve since she previously responded well to Humira. I recommended she maintain meloxicam and leflunomide and discontinue Rinvoq upon starting Cimzia. I will reach out to her after reviewing the labs. Pertinent information including side effects of new medicines were reviewed.       Assessment:  Rheumatoid arthritis   Lumbar spinal stenosis    PLAN:  The following was discussed with the patient.  Recommendations are as follows:  Labs today, as  listed below:   Start Cimzia '400mg'$  sc q2wks x3, then '200mg'$  sc q2wks, pending insurance authorization  Continue Rinvoq '15mg'$  po daily. Stop once starting Cimzia.  Continue meloxicam 7.'5mg'$  po qd prn  Continue leflunomide '20mg'$  po qd    - Comprehensive Metabolic Panel  - CBC with Diff  - C-Reactive Protein  - Erythrocyte Sedimentation Rate  - Hepatitis B Battery  - Hepatitis C Antibody w/Reflex PCR  - Quantiferon-TB Gold Plus; Future      Follow up: 3 months    05/06/2022 @ 10:26 AM - I, Abner Greenspan Au acted as a Education administrator and documented the service/procedure performed to the best of my knowledge in the presence of Julaine Hua, MD who will provide the final review and authentication.  Signed: Abner Greenspan Au

## 2022-05-06 NOTE — Progress Notes (Signed)
I, Johnel Yielding, MD, personally performed the services as described in this documentation. All medical record entries made by the scribe were at my direction and in my presence. I have reviewed the chart and discharge instructions and agree that the record reflects my personal performance and is accurate and complete.

## 2022-05-07 ENCOUNTER — Other Ambulatory Visit (HOSPITAL_BASED_OUTPATIENT_CLINIC_OR_DEPARTMENT_OTHER): Payer: Self-pay

## 2022-05-08 LAB — QUANTIFERON-TB GOLD PLUS
Quantiferon Nil Result: 0.24 IU gamma IF/mL
Quantiferon TB Interpretation: NEGATIVE
Quantiferon TB Mitogen minus Nil Result: 3.22 IU gamma IF/mL
Quantiferon TB1 Ag minus Nil Result: <0 IU gamma IF/mL
Quantiferon TB2 Ag minus Nil Result: <0 IU gamma IF/mL

## 2022-05-15 ENCOUNTER — Ambulatory Visit: Payer: Medicare Other | Attending: Pain Medicine | Admitting: Pain Medicine

## 2022-05-15 DIAGNOSIS — M5431 Sciatica, right side: Secondary | ICD-10-CM

## 2022-05-15 MED ORDER — DEXAMETHASONE SODIUM PHOSPHATE 10 MG/ML IJ SOLN
10.0000 mg | Freq: Once | INTRAMUSCULAR | Status: DC
Start: 2022-05-15 — End: 2022-05-29

## 2022-05-15 NOTE — Patient Instructions (Signed)
[facility]  Post-Procedure Discharge Instructions      After the procedure:  Immediate and complete pain relief is rare  Numbness and/or weakness in the area of your body supplied by the injected nerve; these symptoms should resolve but may last up to several hours  Some soreness and bruising at the injection site(s)    Activities:  If you have any weakness or numbness caused by the injection, DO NOT DRIVE or operate machinery and limit other activity until sensation returns to normal.  You may resume regular exercises/activities as tolerated.  If you received sedation, DO NOT DRIVE or operate machinery for 24 hours.    Medications:  If you stopped taking any blood thinning medications such as Coumadin or Plavix, you may resume these tomorrow unless specified differently by the prescribing physician.    Site care:  You may remove the band-aid after 6 hours.  You may shower today. No swimming, tub baths or hot tubs for 24 hours following your procedure.  For the first 48 hours, apply ice packs to the injection site for 15-20 minutes hourly as needed for comfort.  Wrap a light towel or cloth around ice packs and heating pad to protect the skin.  After 48 hours, use a warm heating pad to the injection site for 15-20 minutes hourly as needed for comfort.    If you received steroids today:  Steroid medications may cause facial flushing, occasional low-grade fevers, hiccups, insomnia, headaches, water retention, increased appetite, increased heart rate, and abdominal cramping or bloating. These side effects occur in only about 5 percent of patients and commonly disappear within one to three days after the injection.  If you are diabetic check your blood sugar more frequently than usual as you may develop an increase in blood sugar for the next 10-14 days. Contact your diabetes physician if this occurs.    Call us if you develop any of the following symptoms in the next 7 days:  Fever above 100 degrees  F     Any unusual increase in your level of pain  Swelling, bleeding, redness, or increased tenderness at the procedure or IV site  Headache not relieved by Tylenol (if you had an epidural steroid injection)  .    Contact us:  Anytime, 24 hours/day, 7 days/week at [facility]  Patient Self-Administered Pain Diary  1 month  06/15/22      The procedure you just had was done in hopes of providing long term pain relief. Depending on the type of procedure you had, you will need to answer the questions below 1 month after your procedure.  Accurate completion and timely reporting of your pain diary(s) enables your Pain MD to review the results and make further treatment recommendations.    1 month after your procedure, write your answers to the 4 questions below and send your pain score diary to [place] either by:      1)  Sending a picture via e[employer]    2)  Faxing to [phone]  3)  Calling the pain score voicemail line at [phone].  Leave the spelling of your first and last name, U#, date of birth and date of procedure and the answers to all of the 4 questions below.             Use this scale to rate your pain  0 '1 2 3 4 5 6 7 8 '$ 9  10  No pain                      worst pain        Procedure Date: 05/15/22    1.  Pain score immediately before procedure: __________    2. 1 month after your procedure, do you feel better?  YES/NO    3.  If you answered YES to the above, by what percentage__________% (0-100%) has your pain been relieved.    4.  Pain Score now: _________ (at time of telephone call)        [facility]  Patient Self-Administered Pain Diary  3 months  08/15/22      The procedure you just had was done in hopes of providing long term pain relief. Depending on the type of procedure you had, you will need to answer the questions below 3 months after your procedure.  Accurate completion and timely reporting of your pain diary(s) enables your Pain MD to review the results and  make further treatment recommendations.    3 months after your procedure, write your answers to the 4 questions below and send your pain score diary to [place] either by:      1)  Sending a picture via [facility]    2)  Faxing to [phone]  3)  Calling the pain score voicemail line at [phone].  Leave the spelling of your first and last name, U#, date of birth and date of procedure and the answers to all of the 4 questions below.             Use this scale to rate your pain  0 1 2 3 4 5 6 7 8 9  10  No pain                      worst pain        Procedure Date: 05/15/22    1.  Pain score immediately before procedure: __________    2. 3 months after your procedure, do you feel better?  YES/NO    3.  If you answered YES to the above, by what percentage__________% (0-100%) has your pain been relieved.    4.  Pain Score now: _________ (at time of telephone call)

## 2022-05-15 NOTE — Progress Notes (Signed)
Today's planned procedure of L5-S1 interlaminar epidural steroid injection was cancelled due to concerns for recent infection. Autumn Camacho reports that she is currently taking antibiotics for an infection that started last week in her foot and resulted in purulent drainage. We discussed we would recommend postponing the procedure until she has completed her antibiotic course until her infection has resolved. We did discuss she has had good benefit from a prednisone burst previously and this may be a solution to ensure she can enjoy her upcoming wedding. I will message Dr. Ardith Dark to discuss this as well as rescheduling the epidural steroid injection once her infection has resolved.

## 2022-05-15 NOTE — Progress Notes (Signed)
Ms. Stanfield identified by name and DOB.   She confirmed she is having a right L5-S1 ILESI  She ambulated to procedure area using a cane.  She denies any possibility of being pregnant or currently/planning to breastfeed.   She has not fallen in the last 6 months.  Fall prevention interventions: Patient provided with non-skid stockings  She denies taking any blood thinners.  She denies having a bleeding disorder.  She has held her medications per provider instructions. Meloxicam held x 4 days  She has not been on antibiotics for the past two weeks. Abx until Monday, right foot infection   She denies a history of fainting during medical procedures.   She has not been NPO.  She undressed self without assistance.  Is she receiving a steroid injection today? Yes  If yes, has she received the COVID-19 vaccine (either brand) within the past 2 weeks? No  OR does she plan to receive the COVID-19 vaccine (either brand) within the next 2 weeks? No  I verbally reviewed written discharge instructions and pain diary with her.   Signed consent for series not older than 90 days is scanned in the patient's chart: Not applicable  Confirmed ride home with her fiance Mali Hayton, [phone]   She has a pre-procedure pain score of 6/10 bilateral lower back, R>L, down RLE

## 2022-05-18 ENCOUNTER — Encounter (HOSPITAL_BASED_OUTPATIENT_CLINIC_OR_DEPARTMENT_OTHER): Payer: Self-pay | Admitting: Pain Medicine

## 2022-05-19 ENCOUNTER — Other Ambulatory Visit (HOSPITAL_BASED_OUTPATIENT_CLINIC_OR_DEPARTMENT_OTHER): Payer: Self-pay | Admitting: Pain Management

## 2022-05-19 DIAGNOSIS — G8929 Other chronic pain: Secondary | ICD-10-CM

## 2022-05-19 MED ORDER — METHYLPREDNISOLONE 4 MG OR TBPK
ORAL_TABLET | ORAL | 0 refills | Status: AC
Start: 2022-05-19 — End: 2022-05-25

## 2022-05-25 ENCOUNTER — Encounter (HOSPITAL_BASED_OUTPATIENT_CLINIC_OR_DEPARTMENT_OTHER): Payer: Self-pay

## 2022-05-29 ENCOUNTER — Ambulatory Visit: Payer: No Typology Code available for payment source | Attending: Pain Medicine | Admitting: Pain Medicine

## 2022-05-29 VITALS — BP 115/74 | HR 88 | Temp 97.8°F | Resp 16

## 2022-05-29 DIAGNOSIS — M5441 Lumbago with sciatica, right side: Secondary | ICD-10-CM | POA: Insufficient documentation

## 2022-05-29 DIAGNOSIS — G8929 Other chronic pain: Secondary | ICD-10-CM | POA: Insufficient documentation

## 2022-05-29 DIAGNOSIS — M5431 Sciatica, right side: Secondary | ICD-10-CM | POA: Insufficient documentation

## 2022-05-29 MED ORDER — DEXAMETHASONE SOD PHOSPHATE PF 10 MG/ML IJ SOLN
INTRAMUSCULAR | Status: AC
Start: 2022-05-29 — End: 2022-05-29
  Filled 2022-05-29: qty 1

## 2022-05-29 MED ORDER — DEXAMETHASONE SODIUM PHOSPHATE 10 MG/ML IJ SOLN
10.0000 mg | Freq: Once | INTRAMUSCULAR | Status: AC
Start: 2022-05-29 — End: 2022-05-29
  Administered 2022-05-29: 10 mg

## 2022-05-29 MED ORDER — IOHEXOL 240 MG/ML IJ SOLN
2.0000 mL | Freq: Once | INTRAMUSCULAR | Status: AC
Start: 2022-05-29 — End: 2022-05-29
  Administered 2022-05-29: 2 mL via EPIDURAL

## 2022-05-29 MED ORDER — BETAMETHASONE SOD PHOS & ACET 6 (3-3) MG/ML IJ SUSP
INTRAMUSCULAR | Status: AC
Start: 2022-05-29 — End: 2022-05-29
  Filled 2022-05-29: qty 5

## 2022-05-29 NOTE — Progress Notes (Signed)
Autumn Camacho identified by name and DOB.   She confirmed she is having a Right L5-S1 ILESI  She ambulated to procedure area with steady gait.  She denies any possibility of being pregnant or currently/planning to breastfeed.   She has not fallen in the last 6 months.  Fall prevention interventions: Patient provided with non-skid stockings  She denies taking any blood thinners.  She denies having a bleeding disorder.  She  Has held her medications per provider instructions.  She has not been on antibiotics for the past two weeks.   She denies a history of fainting during medical procedures.   She has not been NPO.  She undressed self without assistance.  Is She receiving a steroid injection today? Yes  If yes, has She received the COVID-19 vaccine (either brand) within the past 2 weeks? No  OR does She plan to receive the COVID-19 vaccine (either brand) within the next 2 weeks?No  I verbally reviewed written discharge instructions and pain diary with her.   Signed consent for series not older than 90 days is scanned in the patient's chart: No    Confirmed ride home with her husband, Autumn Camacho [phone]     She has a pre-procedure pain score of 6/10 Bilateral lower back, mostly right     Pain score prior to procedure:  6/10  Pain score after procedure:  0/10    Autumn Camacho met discharge criteria:  A/OX4/baseline, VSS/returned to baseline, ambulatory with steady gait. Site clean dry and intact.  Written and verbal discharge instructions, including emergency contact phone numbers, reviewed with Autumn Camacho. Verbal understanding obtained from her.  Autumn Camacho dressed self without assistance. She was discharged in stable condition, ambulatory with steady gait to home with all belongings and with ride/responsible person.

## 2022-05-29 NOTE — Patient Instructions (Signed)
[facility]  Post-Procedure Discharge Instructions      After the procedure:  Immediate and complete pain relief is rare  Numbness and/or weakness in the area of your body supplied by the injected nerve; these symptoms should resolve but may last up to several hours  Some soreness and bruising at the injection site(s)    Activities:  If you have any weakness or numbness caused by the injection, DO NOT DRIVE or operate machinery and limit other activity until sensation returns to normal.  You may resume regular exercises/activities as tolerated.  If you received sedation, DO NOT DRIVE or operate machinery for 24 hours.    Medications:  If you stopped taking any blood thinning medications such as Coumadin or Plavix, you may resume these tomorrow unless specified differently by the prescribing physician.    Site care:  You may remove the band-aid after 6 hours.  You may shower today. No swimming, tub baths or hot tubs for 24 hours following your procedure.  For the first 48 hours, apply ice packs to the injection site for 15-20 minutes hourly as needed for comfort.  Wrap a light towel or cloth around ice packs and heating pad to protect the skin.  After 48 hours, use a warm heating pad to the injection site for 15-20 minutes hourly as needed for comfort.    If you received steroids today:  Steroid medications may cause facial flushing, occasional low-grade fevers, hiccups, insomnia, headaches, water retention, increased appetite, increased heart rate, and abdominal cramping or bloating. These side effects occur in only about 5 percent of patients and commonly disappear within one to three days after the injection.  If you are diabetic check your blood sugar more frequently than usual as you may develop an increase in blood sugar for the next 10-14 days. Contact your diabetes physician if this occurs.    Call us if you develop any of the following symptoms in the next 7 days:  Fever above 100 degrees  F     Any unusual increase in your level of pain  Swelling, bleeding, redness, or increased tenderness at the procedure or IV site  Headache not relieved by Tylenol (if you had an epidural steroid injection)  .    Contact us:  Anytime, 24 hours/day, 7 days/week at [phone]       [facility]  Patient Self-Administered Pain Diary  1 month        The procedure you just had was done in hopes of providing long term pain relief. Depending on the type of procedure you had, you will need to answer the questions below 1 month after your procedure.  Accurate completion and timely reporting of your pain diary(s) enables your Pain MD to review the results and make further treatment recommendations.    1 month after your procedure, write your answers to the 4 questions below and send your pain score diary to us either by:      1)  Sending a picture via eCare    2)  Faxing to [phone]  3)  Calling the pain score voicemail line at [phone].  Leave the spelling of your first and last name, U#, date of birth and date of procedure and the answers to all of the 4 questions below.             Use this scale to rate your pain  0 1 2 3 4 5 6 7 8 9 10    9 10  No pain                      worst pain        Procedure Date: 05/29/22    1.  Pain score immediately before procedure: __________    2. 1 month after your procedure, do you feel better?  YES/NO    3.  If you answered YES to the above, by what percentage__________% (0-100%) has your pain been relieved.    4.  Pain Score now: _________ (at time of telephone call)         [facility]  Patient Self-Administered Pain Diary  3 months        The procedure you just had was done in hopes of providing long term pain relief. Depending on the type of procedure you had, you will need to answer the questions below 3 months after your procedure.  Accurate completion and timely reporting of your pain diary(s) enables your Pain MD to review the results and make further  treatment recommendations.    3 months after your procedure, write your answers to the 4 questions below and send your pain score diary to us either by:      1)  Sending a picture via eCare    2)  Faxing to [phone]  3)  Calling the pain score voicemail line at [phone].  Leave the spelling of your first and last name, U#, date of birth and date of procedure and the answers to all of the 4 questions below.             Use this scale to rate your pain  0 1 2 3 4 5 6 7 8 9 10  No pain                      worst pain        Procedure Date: 05/29/22    1.  Pain score immediately before procedure: __________    2. 3 months after your procedure, do you feel better?  YES/NO    3.  If you answered YES to the above, by what percentage__________% (0-100%) has your pain been relieved.    4.  Pain Score now: _________ (at time of telephone call)

## 2022-05-29 NOTE — Progress Notes (Signed)
PROCEDURE NOTE       Lumbosacral Interlaminar Epidural Steroid Injection    PROCEDURE  Interlaminar epidural steroid injection L5 and S1; side: right.    PRE-PROCEDURE DIAGNOSIS  (M54.41,  G89.29) Chronic right-sided low back pain with right-sided sciatica  (primary encounter diagnosis)    POST-PROCEDURE DIAGNOSIS  (M54.41,  G89.29) Chronic right-sided low back pain with right-sided sciatica  (primary encounter diagnosis)    INDICATION  Autumn Camacho has right lower back pain and right leg pain extending to feet. She has responded previous lumbar epidurals in the past. Denies any residual signs of infection or need for further antibiotics.     ATTENDING PHYSICIAN  Cherylynn Ridges, MD    FELLOW / RESIDENT  Eliane Decree, MD  Seward Grater, MD    SEDATION  No    NPO STATUS  No    INTRAVENOUS LINE  No    OPERATIVE PROCEDURE  Prior to the procedure a final verification was performed and documented.  Autumn Camacho  was brought to the procedure room and placed on the exam table in a comfortable prone position. The sterile field was prepped in a sterile fashion with Chloraprep and sterile drapes were placed.   The target interlaminar space was visualized by anteroposterior fluoroscopic image. Local anesthesia was provided by infiltration of  1% lidocaine. An 18-gauge 3.5 inch Touhy needle was placed below the superior laminar edge of the L5 vertebrae. Using fluoroscopic guidance, the needle was advanced into the intralaminar window under multiplanar fluoroscopic control to prevent excessively deep insertion of the needle.  After engagement of the ligamentum flavum, the loss of resistance to saline technique was used to identify the epidural space. Attempted aspiration yielded no blood or cerebrospinal fluid. Subsequently, Omnipaque-240 was injected. Fluoroscopy showed the typical pattern of the epidural space and no vascular uptake. Lateral view confirmed proper epidural spread.  Injectate included steroid:  Dexamethasone  10 mg/ml 1 mg and local anesthetic: 2.5 mL 0.25 % bupivacaine and 2.5 mL NS were injected into the epidural space. The needle was then withdrawn uneventfully.        POST-PROCEDURE  Autumn Camacho  was transported to the recovery room for observation. Where she  made an uneventful recovery.  Autumn Camacho was given detailed discharge instructions.     COMPLICATIONS  None.     PLAN  Pain diary.  Subsequent care will depend on the results of today's procedure.   Follow up with Dr. Johney Maine for post procedural evaluation and Dr. Ardith Dark for further pain treatemtn     I was continuously present for the entire procedure (interlaminar epidural steroid injection L5/S1 right sided paramedian approach) which was performed under my direct personal supervision.   I reviewed the documentation of the other providers and concur with Dr. Rayburn Ma findings.  Imaging guidance was used for this procedure and representative images were saved. I personally viewed these images.     Cherylynn Ridges, M.D. M.P.H.  Acting Chief Executive Officer of Anesthesiology and [employer] [place]

## 2022-06-01 ENCOUNTER — Encounter (HOSPITAL_BASED_OUTPATIENT_CLINIC_OR_DEPARTMENT_OTHER): Payer: No Typology Code available for payment source | Admitting: Pain Management

## 2022-06-02 ENCOUNTER — Other Ambulatory Visit (HOSPITAL_BASED_OUTPATIENT_CLINIC_OR_DEPARTMENT_OTHER): Payer: Self-pay | Admitting: Rheumatology

## 2022-06-02 DIAGNOSIS — M0579 Rheumatoid arthritis with rheumatoid factor of multiple sites without organ or systems involvement: Secondary | ICD-10-CM

## 2022-06-04 MED ORDER — MELOXICAM 7.5 MG OR TABS
ORAL_TABLET | ORAL | 1 refills | Status: DC
Start: 2022-06-04 — End: 2022-08-23

## 2022-06-08 NOTE — Progress Notes (Deleted)
OFFICE VISIT - FOLLOW-UP    Samantha Ragen  [id number]    VISIT DIAGNOSES:  (M54.41,  G89.29) Chronic right-sided low back pain with right-sided sciatica  (primary encounter diagnosis)  (M54.31) Right sciatic nerve pain  (M25.551,  G89.29) Chronic right hip pain  (M16.11) Primary osteoarthritis of right hip    HISTORY OF PRESENT ILLNESS:  Monzerrath Mcburney is a 56 year old patient who presents for follow-up for chronic pain. She underwent her last clinic follow-up with Dr. Ranae Plumber in May 2023.     Briefly, she has a history of low back pain, history of multiple joints including right hip, left knee, and also small joints in hands and feet, and she has history of rheumatoid arthritis. After she underwent right hip replacement, left knee replacement revision, she still had right low back pain radiating to her RLE.     Prior therapies/treatments/interventions utilized:  - mindfulness-based stress reduction program,   - right hip replacement, revision left knee replacement  - Cimzia, Rinvoq, meloxicam, lefluenomide for rheumatoid arthritis (rheum visit 7/19)      Prior treatments at our Clinic include:   - Interlaminar epidural steroid injection L5 and S1; side: right. (Dr. Johney Maine; 05/29/22)      INTERVAL HISTORY:  -     Imaging:  MRI L spine 03/11/22  1.  Multilevel lumbar spondylosis greatest at L4-L5 with moderate spinal stenosis and severe right neuroforaminal stenosis in the setting of grade 1 anterolisthesis with uncovering of the disc, hypertrophy of ligamentum flavum, and severe facet arthropathy.  2.  Moderate spinal and moderate to severe right foraminal stenosis at L2-L3 with disc bulge with right paracentral extrusion and facet arthropathy.  3.  Moderate to severe right and severe left foraminal stenosis at L5-S1.    PRESCRIPTION MONITORING PROGRAM  Up to date information could not be retrieved from the PDMP.  Source Status Last Checked for Updates   [place] PDMP Patient Not Found 06/02/2022  5:59 PM      [place] PMP Medication Dispense History  No medication dispenses to display (since 06/13/2021). Last query status: Patient Not Found.      MEDICATIONS:   Outpatient Medications Prior to Visit   Medication Sig Dispense Refill    acetaminophen 500 MG tablet Take 2 tablets (1,000 mg) by mouth every 6 hours as needed.      albuterol HFA 108 (90 Base) MCG/ACT inhaler  (Patient not taking: Reported on 05/29/2022)      fluticasone propionate HFA 220 MCG/ACT inhaler Inhale 1 puff by mouth. (Patient not taking: Reported on 05/29/2022)      gabapentin 300 MG capsule Start from 300 mg in the morning, increase every 3 days up to a dose of 300 mg in the morning, 300 mg in the afternoon and 600 mg at bedtime. (Patient not taking: Reported on 05/15/2022) 30 capsule 0    gabapentin 300 MG capsule Take 300 mg at bedtime. Increase weekly by 300 mg up to: 300 mg during the day and 600 mg at bedtime. (Patient not taking: Reported on 05/15/2022) 90 capsule 2    leflunomide 20 MG tablet TAKE 1 TABLET DAILY 90 tablet 0    meloxicam 7.5 MG tablet TAKE 1 TO 2 TABLETS DAILY 180 tablet 1    predniSONE 5 MG tablet Take prednisone '20mg'$  po daily for 4 days, '15mg'$  po daily for 4 days, '10mg'$  po daily for 4 days, '5mg'$  po daily for 4 days (Patient not taking: Reported on 05/15/2022) 40  tablet 0    upadacitinib ER (Rinvoq) 15 MG 24 hour tablet Take 1 tablet (15 mg) by mouth every morning. 30 tablet 2    valACYclovir 1 g tablet        No facility-administered medications prior to visit.       ALLERGIES:  Fentanyl, Hydromorphone, Remicade [infliximab], Codeine, and Nortriptyline    PHYSICAL EXAM:  Physical Exam        IMPRESSION:   Renisha Cockrum is a 56 year old patient with history of rheumatoid arthritis, s/p left knee replacement, right hip replacement who presents for follow-up regarding low back  pain. Since her last clinic visit in May 2023 with Dr. Ranae Plumber, she underwent MRI imaging of her lumbar spine showing multilevel lumbar  spondylosis L4-L5 with severe neuroformainal stenosis at L5-S1. She subsequently underwent a lumbar interlaminar steroid injection on 05/29/22.           PLAN:  We have discussed with Nyra Jabs  the following plan.    1.    {Consider diet advice: smart-phrase .PAINDIET}    Coordination of care  ***

## 2022-06-09 ENCOUNTER — Other Ambulatory Visit (HOSPITAL_BASED_OUTPATIENT_CLINIC_OR_DEPARTMENT_OTHER): Payer: Self-pay

## 2022-06-09 ENCOUNTER — Ambulatory Visit: Payer: No Typology Code available for payment source | Attending: Pain Management | Admitting: Pain Management

## 2022-06-09 ENCOUNTER — Encounter (HOSPITAL_BASED_OUTPATIENT_CLINIC_OR_DEPARTMENT_OTHER): Payer: Self-pay | Admitting: Pain Management

## 2022-06-09 VITALS — BP 120/86 | HR 93 | Temp 98.7°F | Ht 61.0 in | Wt 170.0 lb

## 2022-06-09 DIAGNOSIS — M25551 Pain in right hip: Secondary | ICD-10-CM | POA: Insufficient documentation

## 2022-06-09 DIAGNOSIS — G8929 Other chronic pain: Secondary | ICD-10-CM | POA: Insufficient documentation

## 2022-06-09 DIAGNOSIS — M5441 Lumbago with sciatica, right side: Secondary | ICD-10-CM | POA: Insufficient documentation

## 2022-06-09 DIAGNOSIS — M1611 Unilateral primary osteoarthritis, right hip: Secondary | ICD-10-CM | POA: Insufficient documentation

## 2022-06-09 DIAGNOSIS — M5431 Sciatica, right side: Secondary | ICD-10-CM | POA: Insufficient documentation

## 2022-06-09 NOTE — Progress Notes (Signed)
Review of Systems   Constitutional: Negative.    HENT:  Positive for congestion.    Eyes: Negative.    Respiratory: Negative.     Cardiovascular: Negative.    Gastrointestinal:  Positive for constipation and diarrhea.   Endocrine: Negative.    Genitourinary: Negative.    Musculoskeletal:  Positive for back pain and gait problem.   Skin: Negative.    Allergic/Immunologic: Positive for environmental allergies and immunocompromised state.   Hematological: Negative.    Psychiatric/Behavioral: Negative.

## 2022-06-10 ENCOUNTER — Encounter (HOSPITAL_BASED_OUTPATIENT_CLINIC_OR_DEPARTMENT_OTHER): Payer: Self-pay | Admitting: Rheumatology

## 2022-06-10 NOTE — Progress Notes (Signed)
Progress Note     Patient Name: Autumn Camacho, Autumn Camacho.  Date of Service: June 09, 2022    Patient ID: [id number] Date of Birth: 02-Jun-1966    Clinician: Ardith Dark, MD Facility: [id number]   Location: [facility]         The patient is a very pleasant 56 year old patient well-known to [place] with last visit being in May of this year with a history of low back pain, a history of joint pain, and a history of rheumatoid arthritis pain in small joints of hands and feet.  When we saw Autumn Camacho most recently she presented with right low back and right leg in a sciatic type pain, which was most prominent pain at that time.  Therefore, we had ordered a lumbar spine MRI, which was done later in May of this year, and it showed multilevel lumbar spondylosis, which was greatest at L4-L5, with moderate spinal stenosis and severe right neural foraminal stenosis in the setting of grade 1 anterolisthesis with uncovering of the disk, hypertrophy of ligamentum flavum, and severe facet arthropathy.  There was moderate spinal and moderate-to-severe right foraminal stenosis at L2-L3 with disk bulge with right paracentral extrusion and facet arthropathy, and moderate-to-severe right and severe left foraminal stenosis at L5-S1.  Although certainly it was a multilevel lumbar spondylosis by imaging, but still we thought that there was at least some correlation between the patient's clinical presentation of right sciatic pain and lumbar imaging, and therefore we had referred within our group the patient for a lumbar epidural steroid injection, which she had done earlier in August with Dr. Johney Maine, my colleague, on 08/11.  So that the patient now tells [place] that she has improved, that her pain although still present, but she improved by at least 90%.  This is obviously wonderful news, and Autumn Camacho asked whether she should be walking a little more now that her pain is less.     Otherwise, from a medication standpoint, she is no longer on gabapentin since  gabapentin gave her side effects, and mostly she takes Tylenol and meloxicam when she has pain.     PHYSICAL EXAMINATION:    GENERAL:  She was alert and oriented, in no distress.  PSYCHIATRIC:  Affect was good.  She was pleasant as always.  RESPIRATIONS:  Unlabored.  MUSCULOSKELETAL:  She did not have any assistive devices with her.     ASSESSMENT AND PLAN:    We saw the patient in followup about 10 days ago, a lumbar epidural steroid injection was done with excellent results so far, and we would suggest the following:  1.  Certainly walking more, moving more, but not overdoing it, this is what we have recommended to the patient today.  2.  We think that epidural steroid injection can be repeated at least once in 1 or 2 months depending how she does.  If everything goes well and if pain does not recur then certainly no need, but if pain starts to be recurring then we would suggest yet another epidural steroid injection with one of my colleagues, most likely with Dr. Johney Maine, although he is out of clinic at this point.  Ardith Dark, MD      Date Dictated: 06/09/2022 12:02:05 PM    Date Transcribed: 06/10/2022    IS/GJ   Job #: [id number]

## 2022-06-15 ENCOUNTER — Other Ambulatory Visit (HOSPITAL_BASED_OUTPATIENT_CLINIC_OR_DEPARTMENT_OTHER): Payer: Self-pay

## 2022-06-16 ENCOUNTER — Encounter (HOSPITAL_COMMUNITY): Payer: Self-pay

## 2022-06-16 ENCOUNTER — Other Ambulatory Visit (HOSPITAL_BASED_OUTPATIENT_CLINIC_OR_DEPARTMENT_OTHER): Payer: Self-pay | Admitting: Pain Medicine

## 2022-06-16 ENCOUNTER — Other Ambulatory Visit (HOSPITAL_BASED_OUTPATIENT_CLINIC_OR_DEPARTMENT_OTHER): Payer: Self-pay

## 2022-06-16 DIAGNOSIS — G8929 Other chronic pain: Secondary | ICD-10-CM

## 2022-06-16 NOTE — Progress Notes (Signed)
Repeat lumbar epidural steroid injection in 1-3 months if pain returns. Orders placed to schedule.

## 2022-06-25 ENCOUNTER — Ambulatory Visit (HOSPITAL_BASED_OUTPATIENT_CLINIC_OR_DEPARTMENT_OTHER): Payer: Self-pay | Admitting: Ambulatory Care

## 2022-06-25 MED ORDER — CIMZIA-STARTER 200 MG/ML SC PSKT
PREFILLED_SYRINGE | SUBCUTANEOUS | 0 refills | Status: DC
Start: 2022-06-25 — End: 2022-11-23

## 2022-06-25 NOTE — Progress Notes (Signed)
Rheumatology - New Rx Reviewed by Gweneth Dimitri, PharmD on 06/25/22     Referral by [place] on 05/06/22 for: Specialty Pharmacy- Rheumatology    Indication:  Rheumatoid arthritis involving multiple sites with positive rheumatoid factor  (primary encounter diagnosis)  (Z79.899) Long-term use  Therapy Rationale: uncontrolled RA     Therapy Related Testing/Immunizations:   Flu: rec  PCV13/PPSV23: rec  Quantum Interferon Gold/PPD status: negative 05/06/22  Hep B serologies: negative 05/06/22  Baseline CBC, CMP if applicable: 9/79/89    Current Disease Activity: active  Active Infection Status: none    Previous rheumatologic medication regimens: methotrexate, enbrel, remicade, humira, orencia, xeljanz, rinvoq.     Current prescribed rheumatologic medication regimen:   Continue Rinvoq 57m po daily. Stop once starting Cimzia.  Continue meloxicam 7.553mpo qd prn  Continue leflunomide 2033mo qd    Current Outpatient Medications   Medication Sig Dispense Refill    acetaminophen 500 MG tablet Take 2 tablets (1,000 mg) by mouth every 6 hours as needed.      albuterol HFA 108 (90 Base) MCG/ACT inhaler  (Patient not taking: Reported on 05/30/10/9417    certolizumab (Cimzia Starter Kit) 6 X 200 MG/ML prefilled syringe kit Inject 2 syringes (400 mg) under the skin initially, and at weeks 2 and 4 followed by 200 mg every other week 3 each 0    fluticasone propionate HFA 220 MCG/ACT inhaler Inhale 1 puff by mouth. (Patient not taking: Reported on 05/29/2022)      gabapentin 300 MG capsule Start from 300 mg in the morning, increase every 3 days up to a dose of 300 mg in the morning, 300 mg in the afternoon and 600 mg at bedtime. (Patient not taking: Reported on 05/15/2022) 30 capsule 0    gabapentin 300 MG capsule Take 300 mg at bedtime. Increase weekly by 300 mg up to: 300 mg during the day and 600 mg at bedtime. (Patient not taking: Reported on 05/15/2022) 90 capsule 2    leflunomide 20 MG tablet TAKE 1 TABLET DAILY 90 tablet 0     meloxicam 7.5 MG tablet TAKE 1 TO 2 TABLETS DAILY 180 tablet 1    predniSONE 5 MG tablet Take prednisone 32m46m daily for 4 days, 15mg35mdaily for 4 days, 10mg 55maily for 4 days, 5mg po63mily for 4 days (Patient not taking: Reported on 05/15/2022) 40 tablet 0    upadacitinib ER (Rinvoq) 15 MG 24 hour tablet Take 1 tablet (15 mg) by mouth every morning. 30 tablet 2    valACYclovir 1 g tablet        No current facility-administered medications for this visit.    Drug-drug interaction screen with current medications in EPIC as of 06/25/22:   Clinically Relevant Drug Interactions Identified: yes  List Interactions: Leflunomide and Rinvoq- risk for hematologic toxicity such as pancytopenia, agranulocytosis, and/or thrombocytopenia may be increased.  Drug Management Plan: Monitoring       RX APPROVAL     Patient Name: Autumn Camacho, [id number] #: [id number] Insurance: [employer]ARM[employer]acy Coverage Subscriber ID: [id number] Number: [id number]        Patient has received insurance approval for coverage of the following medication:     Medication: Cimzia Starter Kit 6 X 200 MG/ML prefilled syringe kit  Cimzia 2 X 200 MG/ML prefilled syringe kit     Quantity/Days Supply: 3/1  kits / 28     Approval Dates:  04/11/2022 to 11/07/2022     Approval #: [id number]     [place] MEDICINE MAILORDER ELIGIBILITY: No     Pharmacy Prescription Sent to: [facility] EPIC ID: [id number] RTMYT:[phone] Fax: [phone]     Reason for this Pharmacy Choice: Scientist, water quality of Medication to Patient: $0.00 After copay card and [phone] [employer]), $542.98 (Maintenance) After insurance     Financial Assistance: drug manufacturer co-pay card eligible , if secondary Medicare is not billed     Additional Comments: Secondary Medicare has approved the PA, but is not paying anything toward the co-pay. Patient can use co-pay card with primary commercial coverage  rather than bill Medicare     STARTER     MAINTENANCE  Pharmacy: Sending Rx to [facility] for 1st month supply. Will send refills to [facility] when patient presents for teaching visit on 07/03/22.

## 2022-06-30 ENCOUNTER — Emergency Department
Admission: EM | Admit: 2022-06-30 | Discharge: 2022-07-01 | Disposition: A | Discharge status: Home / Self Care | Payer: No Typology Code available for payment source | Attending: Emergency Medicine | Admitting: Emergency Medicine

## 2022-06-30 ENCOUNTER — Encounter (HOSPITAL_BASED_OUTPATIENT_CLINIC_OR_DEPARTMENT_OTHER): Payer: Self-pay | Admitting: Pain Management

## 2022-06-30 ENCOUNTER — Emergency Department (EMERGENCY_DEPARTMENT_HOSPITAL): Payer: No Typology Code available for payment source

## 2022-06-30 ENCOUNTER — Other Ambulatory Visit: Payer: Self-pay

## 2022-06-30 DIAGNOSIS — M5136 Other intervertebral disc degeneration, lumbar region: Secondary | ICD-10-CM | POA: Insufficient documentation

## 2022-06-30 DIAGNOSIS — M545 Low back pain, unspecified: Secondary | ICD-10-CM

## 2022-06-30 DIAGNOSIS — M069 Rheumatoid arthritis, unspecified: Secondary | ICD-10-CM | POA: Insufficient documentation

## 2022-06-30 DIAGNOSIS — M4807 Spinal stenosis, lumbosacral region: Secondary | ICD-10-CM | POA: Insufficient documentation

## 2022-06-30 DIAGNOSIS — Z79899 Other long term (current) drug therapy: Secondary | ICD-10-CM | POA: Insufficient documentation

## 2022-06-30 DIAGNOSIS — Z87891 Personal history of nicotine dependence: Secondary | ICD-10-CM | POA: Insufficient documentation

## 2022-06-30 DIAGNOSIS — G8929 Other chronic pain: Secondary | ICD-10-CM | POA: Insufficient documentation

## 2022-06-30 DIAGNOSIS — M5441 Lumbago with sciatica, right side: Secondary | ICD-10-CM | POA: Insufficient documentation

## 2022-06-30 MED ORDER — OXYCODONE HCL 5 MG OR TABS
5.0000 mg | ORAL_TABLET | Freq: Once | ORAL | Status: AC
Start: 2022-06-30 — End: 2022-06-30
  Administered 2022-06-30: 5 mg via ORAL
  Filled 2022-06-30: qty 1

## 2022-06-30 MED ORDER — OXYCODONE HCL 5 MG OR TABS
5.0000 mg | ORAL_TABLET | Freq: Four times a day (QID) | ORAL | 0 refills | Status: DC | PRN
Start: 2022-06-30 — End: 2022-08-23

## 2022-06-30 MED ORDER — DIAZEPAM 5 MG OR TABS
5.0000 mg | ORAL_TABLET | Freq: Two times a day (BID) | ORAL | 0 refills | Status: DC | PRN
Start: 2022-06-30 — End: 2022-08-23

## 2022-06-30 MED ORDER — DIAZEPAM 5 MG OR TABS
5.0000 mg | ORAL_TABLET | Freq: Once | ORAL | Status: AC
Start: 2022-06-30 — End: 2022-06-30
  Administered 2022-06-30: 5 mg via ORAL
  Filled 2022-06-30: qty 1

## 2022-06-30 NOTE — Discharge Instructions (Signed)
Follow-up with your primary care physician to go over your results from today's visit and to see how you are doing.  Return to the emergency department if symptoms worsen, change, new symptoms develop, or for any other concerns.

## 2022-06-30 NOTE — Telephone Encounter (Signed)
Dr. Doran Clay and Dr. Johney Maine-    [name] called back.  She states she initially felt well and had a lot of pain relief after her Right L5-S1 ILESI on 05/29/22.  She states over the past 2 weeks her same preinjection symptoms returned such as low back pain and right leg numbness down to her foot.    She adds that she has developed new symptoms over the course of the two weeks with muscle pain in hips, glutes and front and back of thighs.  She says her muscles hurt so bad it is hard to stand up.  She often feels like sh is going to fall.  She feels she has developed over the past 2 weeks new weakness in her thighs and hips.  She has had diarrhea but has gotten better with metamucil.  She has notice increase in frequency with bladder leaking spontaneously as oppose to only leaking with coughing or sneezing.    Advised pt that this cannot be managed over the phone and she needs to be seen in the ED today as symptoms are worrisome and she needs to be assessed and treated by a doctor.  Pt was teary and states she does have her husband who can take her to the ED and that she will be going to [place].  She says she is able to walk to the car.  Requested pt call back with an update after she is seen.  She states understanding and agrees with plan.

## 2022-06-30 NOTE — ED Provider Notes (Signed)
CHIEF COMPLAINT   Chief Complaint   Patient presents with    Back Pain            HISTORY OF PRESENT ILLNESS AND REVIEW OF SYSTEMS      56 year old female with history of RA, chronic low back pain, presenting to the emergency department with low back pain.  Patient is followed by the pain clinic, had an ESI that seemed to improve the pain for about 2 weeks.  2 weeks ago, the pain returned as she was doing her PT exercises at home.  Localized to the low back.  Radiates to the right lower extremity with associated numbness, which is not new.  No saddle anesthesia.  However, she has noted that she is "dribbling" urine frequently now and has sensation that she cannot fully empty her bladder.  She has to stay seated on the toilet and more urine comes out after a while.  Additionally, her usual meloxicam and Tylenol are not enough to control the pain.  Patient denies any trauma.  Is ambulatory holding onto her husband.         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       Past Surgical History:   Procedure Laterality Date    foot Bilateral     Arch surgery    KNEE ARTHROPLASTY      LEFT 2018 REPAIR 2019    KNEE ARTHROPLASTY Left 05/01/2020    PR ADENOIDECTOMY PRIMARY <AGE 64      AGE 36  T/A     PR ANES; TOTAL KNEE REPLACEMENT Left 04/01/2020    PR TONSILLECTOMY ONE-HALF <AGE 67      PR UNLISTED PROCEDURE FEMUR/KNEE Bilateral     TKA    PR UNLISTED PROCEDURE FEMUR/KNEE      Left revision TKA     PR UNLISTED PROCEDURE FOOT/TOES Bilateral 2009 R,  2010 L    Reconstructive surgery    PR UNLISTED PROCEDURE PELVIS/HIP JOINT      DOS 07/30/20 R THA    TKA Left 2019    revision from prior TKA          MEDICATIONS AND ALLERGIES     OUTPATIENT  MEDICATIONS:   Current Outpatient Medications   Medication Instructions    acetaminophen (TYLENOL) 1,000 mg, Oral, Every 6 hours PRN    albuterol HFA 108 (90 Base) MCG/ACT inhaler No dose, route, or frequency recorded.    certolizumab (Cimzia Starter Kit) 6 X 200 MG/ML prefilled syringe kit Inject 2 syringes (400 mg) under the skin initially, and at weeks 2 and 4 followed by 200 mg every other week    diazePAM (VALIUM) 5 mg, Oral, 2 times daily PRN    fluticasone propionate HFA 220 MCG/ACT inhaler 1 puff    gabapentin 300 MG capsule Take 300 mg at bedtime. Increase weekly by 300 mg up to: 300 mg during the day and 600 mg at bedtime.    gabapentin 300 MG capsule Start from 300 mg in the morning, increase every 3 days up to a dose of  300 mg in the morning, 300 mg in the afternoon and 600 mg at bedtime.    leflunomide 20 MG tablet TAKE 1 TABLET DAILY    meloxicam 7.5 MG tablet TAKE 1 TO 2 TABLETS DAILY    oxyCODONE 5 mg, Oral, Every 6 hours PRN    predniSONE 5 MG tablet Take prednisone 63m po daily for 4 days, 135mpo daily for 4 days, 1054mo daily for 4 days, 5mg76m daily for 4 days    upadacitinib ER (RINVOQ) 15 mg, Oral, Every morning    valACYclovir 1 g tablet No dose, route, or frequency recorded.       ALLERGIES:   Fentanyl, Hydromorphone, Remicade [infliximab], Codeine, and Nortriptyline              SOCIAL HISTORY AND FAMILY HISTORY   Social History     Tobacco Use    Smoking status: Former     Packs/day: 1.00     Years: 20.00     Pack years: 20.00     Types: Cigarettes     Quit date: 2015     Years since quitting: 8.7    Smokeless tobacco: Never    Tobacco comments:     hx : Off and on smoker but had vape , quit 2018.   Substance Use Topics    Alcohol use: Not Currently     Comment: remote history of alcohol dependence    Drug use: Never       Family History       Problem (# of Occurrences) Relation (Name,Age of Onset)    Cancer (1) Father    Heart Attack (1) Father    Rheumatoid Arthritis (1) Other                   PHYSICAL EXAM   ED VITALS:  Vitals (Arrival)      T: 36.4 C (06/30/22 1702)  BP: (!) 145/99 (06/30/22 1702)  HR: 99 (06/30/22 1702)  RR: 15 (06/30/22 1702)  SpO2: 94 % (06/30/22 1702)     Vitals (Most recent in last 24 hrs)   T: 36.4 C (06/30/22 1702)  BP: (!) 145/99 (06/30/22 1702)  HR: 99 (06/30/22 1702)  RR: 15 (06/30/22 1702)  SpO2: 94 % (06/30/22 1702) Room air  T range: Temp  Min: 36.4 C  Max: 36.4 C  (no weight taken for this visit)     (no height taken for this visit)     There is no height or weight on file to calculate BMI.       Physical Exam  Vitals reviewed.   Constitutional:       General: She is not in acute distress.     Appearance: Normal appearance. She is well-developed. She is not ill-appearing or toxic-appearing.   HENT:      Head: Normocephalic and atraumatic.      Right Ear: External ear normal.      Left Ear: External ear normal.   Eyes:      Extraocular Movements: Extraocular movements intact.      Conjunctiva/sclera: Conjunctivae normal.      Pupils: Pupils are equal, round, and reactive to light.   Neck:      Musculoskeletal: Neck supple.   Cardiovascular:      Rate and Rhythm: Normal rate and regular rhythm.      Pulses: Normal pulses.   Pulmonary:      Effort: Pulmonary effort is normal. No respiratory distress.  Breath sounds: Normal breath sounds.   Abdominal:      Palpations: Abdomen is soft.      Tenderness: There is no abdominal tenderness. There is no right CVA tenderness, left CVA tenderness, guarding or rebound.   Musculoskeletal:         General: Normal range of motion.      Cervical back: Neck supple.      Comments: Reproducible tenderness to palpation over lower lumbar spine, both midline and paraspinal.   Skin:     General: Skin is warm and dry.      Capillary Refill: Capillary refill takes less than 2 seconds.   Neurological:      General: No focal deficit present.      Mental Status: She is alert and oriented to person, place, and time.              LABORATORY:   Labs Reviewed - No data to display      IMAGING:     ED Wet Read -   MRI L Spine wo Contrast   Final Result   1.  Multilevel degenerative disc disease causing severe spinal canal narrowing at L4-L5 and L2-L3. Overall these findings are similar to the previous exam.   2.  Severe neural foraminal stenosis at L5-S1 on the left and L4-L5 on the right.       The following findings are so common in normal, pain-free volunteers that while we report their presence, they must be interpreted with caution and in the context of the clinical situation. Among people between the age of 84 and 46 years who do not have back pain, an MRI will find that about:   *  8 in 10 have disk degeneration   *  7 in 10 have disk signal loss (desiccation)   *  6 in 10 have disk height loss   *  6 in 10 have a bulging disk    *  3 in 10 have an annular fissure   *  3 in 10 have a disk protrusion   Note that even 3 in 10 means that the finding is quite common in people without back pain.   (AJNR 2015; 6:811-6)      Preliminary report by neuroradiology fellow. Unless a final report appears below the images have not been reviewed by an attending radiologist.          ATTENDING FINAL REVIEW      I agree with the preliminary report.      I have personally reviewed the images and agree with the report (or as edited).          Radiology Final Result -   No image results found.              EKG DOCUMENTATION                 SUICIDE RISK EVALUATION             SEPSIS               ED COURSE/MEDICAL DECISION MAKING      Patient presenting to the emergency department with acute on chronic low back pain radiating to the right lower extremity with associated numbness.  Now with dribbling of urine, no incontinence, no retention.    MRI L-spine revealed multilevel degenerative disc disease causing severe spinal canal narrowing at L4-L5 and L2-L3.  Overall these findings are similar to the previous exam.  Severe neuroforaminal stenosis at L5-S1 on  the left and L4-L5 on the right.    Results were discussed with the patient.  She is feeling improved after oral oxycodone, diazepam.  Case discussed with neurosurgery, Dr. Reesa Chew, imaging reviewed, patient can follow-up in spine clinic for consultation.  This was discussed with the patient and she is agreeable with plan.  Also, follow-up with PCP and her pain specialist.  She is very well-appearing, nontoxic.  At this time, patient is safe for discharge.  Discussed indications to return to the ED.  Questions answered.  Patient discharged home in stable condition.                                                 Medications Given in the ED:   Medications   oxyCODONE tablet 5 mg (5 mg Oral Given 06/30/22 2017)   diazePAM (Valium) tablet 5 mg (5 mg Oral Given 06/30/22 2017)              CLINICAL IMPRESSION AND DISPOSITION (Link)     Clinical Impressions:   [M54.41, G89.29] Chronic bilateral low back pain with right-sided sciatica        Terressa Koyanagi, MD  [address]  [place] [address]  [phone]    Schedule an appointment as soon as possible for a visit       [address], [place] [address]  [phone]    Schedule an appointment as soon as possible for a visit         Patient was given scripts for the following medications.  Discharge Medication List as of 06/30/2022 11:41 PM             Disposition: Discharge        [place] - No Critical Care                Lorelee Market, MD  07/01/22 [phone]

## 2022-06-30 NOTE — ED Triage Notes (Signed)
Patient presents with lower back pain ongoing for several months. Describes as "tingling" lower back pain radiating down right leg, as well as weakness bilaterally. Also describes "some change in continence," stating she has some occasional urinary "dribbling" starting (stated history of some stress incontinence).     Got steroid injection about one week ago with the [place] pain clinic, started PT exercises at home two weeks ago and states this is when the pain became "unbearable." Denies saddle anesthesia, recent falls.

## 2022-07-01 NOTE — Telephone Encounter (Signed)
Autumn Ridges, MD  [facility] Clinical Support Staff Beau Fanny, MD  Phone Number: [phone]     Thanks Autumn Camacho,    Looking into the ED visit yesterday the repeat MRI was fairly consistent with prior and she was discharged without concern and improved symptoms. She is going to meet with Dr. Reesa Chew for evaluation of her severe stenosis. When Dr. Doran Clay returns we can discuss next steps. She currently has an order in for a repeat ESI with me, but I think we should probably cancel based on such limited relief.      Called patient and relayed the above information from Dr. Johney Maine to her.  She states understanding and agrees with plan and appreciates call back.

## 2022-07-03 ENCOUNTER — Ambulatory Visit: Payer: No Typology Code available for payment source | Admitting: Ambulatory Care

## 2022-07-06 ENCOUNTER — Telehealth (HOSPITAL_BASED_OUTPATIENT_CLINIC_OR_DEPARTMENT_OTHER): Payer: Self-pay

## 2022-07-06 NOTE — Telephone Encounter (Signed)
Autumn Camacho is asking for Dr. Gilmer Mor clinical team to reach out. She has some information she needs to relay to them/ him about what happened when she went to the pain management appt. He referred her to. Please call her call #.

## 2022-07-06 NOTE — Telephone Encounter (Signed)
Spoke with Autumn Camacho and received an update regarding her symptoms.     Pt had epidural steroid injection on 05/29/22 by Dr. Johney Maine.   Received some pain relief for about 2 weeks. Then did some PT exercises at home 2 days in a row and since then has felt "terrible pain like muscle pain" in her hips, buttocks and thighs. She feels weakness in in her hips, buttocks and thighs. Does not feel stable walking, tends to use furniture/wall/husband to get around. Endorses numbness down right leg to foot. BUE are tender to the touch, achy and swollen at times.     Standing and walking increases pain.   Lying down makes the pain better. Was prescribed muscle relaxant- which has provided some relief but not much. Was also prescribed oxycodone but never filled the Rx. She does not want to use narcotics.     Was seen in ER on 9/12- MRI was done. Pt was referred to neurosurgery and has an appointment on 07/20/22 with Dr. Reesa Chew at [facility] Neuro[facility] clinic.     Pt concerned that some of her symptoms sound like polymyalgia rheumatica.     Route to Dr. Janan Halter for recs    [name], BSN, RN   [facility] Ambulatory Float Team  Assigned to: Rheumatology Clinic

## 2022-07-07 NOTE — Telephone Encounter (Signed)
Patient is scheduled 9.21.2023 with Neda. Will be starting Cimzia    Patient is scheduled with Dr. Han 10.25.2023.    Dr. Janan Halter to review.

## 2022-07-08 ENCOUNTER — Telehealth (HOSPITAL_BASED_OUTPATIENT_CLINIC_OR_DEPARTMENT_OTHER): Payer: Self-pay | Admitting: Ambulatory Care

## 2022-07-08 NOTE — Telephone Encounter (Signed)
Called pt she is pretty adamant about keeping the apt without the rx   Autumn Camacho please advise

## 2022-07-08 NOTE — Telephone Encounter (Signed)
Dr. Janan Halter patient.    Patient hasn't received medication from [facility], no estimated delivery date.    Patient has medication teaching telemedicine appointment scheduled for 9/21 at 3:00 PM.    Patient has done two different self-injectables (vial draw and punch) previously, asks can she attend telemed appointment without medication?

## 2022-07-09 ENCOUNTER — Ambulatory Visit: Payer: No Typology Code available for payment source | Attending: Rheumatology | Admitting: Ambulatory Care

## 2022-07-09 DIAGNOSIS — M0579 Rheumatoid arthritis with rheumatoid factor of multiple sites without organ or systems involvement: Secondary | ICD-10-CM

## 2022-07-09 NOTE — Telephone Encounter (Signed)
Please inform the patient that her symptoms were likely related to her spinal stenosis. Polymyalgia symptoms are usually worse in the morning with increased stiffness. I wonder what makes her concerned about polymyalgia.

## 2022-07-09 NOTE — Telephone Encounter (Signed)
Patient is scheduled with Neda today, 9.21.2023.

## 2022-07-09 NOTE — Progress Notes (Signed)
Distant Site Telemedicine Encounter  I conducted this encounter via secure, live, face-to-face video conference with the patient. I reviewed the risks and benefits of telemedicine as pertinent to this visit and the patient agreed to proceed.    Provider Location: On-site location (clinic, hospital, on-site office)  Patient Location: At home  Present with patient: No one else present     Referring Provider: Julaine Hua, MD   Referral Date: 05/06/22    An interpreter was not needed for the visit.    SUBJECTIVE:  Autumn Camacho is a 56 year old female who was seen for Rheumatoid Arthritis treatment initiation Cimzia , week0.     Didn't take Rinvoq today. Cimzia will arrive today    Indication:  Rheumatoid arthritis involving multiple sites with positive rheumatoid factor  (primary encounter diagnosis)  (W54.627) Long-term use  Therapy Rationale: uncontrolled RA      Therapy Related Testing/Immunizations:   Flu: rec  PCV13/PPSV23: UTD  Quantum Interferon Gold/PPD status: negative 05/06/22  Hep B serologies: negative 05/06/22  Baseline CBC, CMP if applicable: 0/35/00     Current Disease Activity: active  Active Infection Status: none     Previous rheumatologic medication regimens: methotrexate, enbrel, remicade, humira, orencia, xeljanz, rinvoq.      Current prescribed rheumatologic medication regimen:   Continue Rinvoq 60m po daily. Stop once starting Cimzia.  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    Osteoarthritis of right hip     Osteoarthritis of right knee    Knee dislocation, left, initial encounter    Failed total left knee replacement (HCC)    Chronic instability of left knee    Failed total knee replacement, subsequent encounter    Status post revision of total replacement of left knee    Chronic right-sided low back pain with sciatica       Review of patient's allergies indicates:  Allergies   Allergen Reactions    Fentanyl Other and Unknown     Per patient, she has "forgotten to breathe"  Per patient, she has "forgotten to breathe"  Per patient, she has "forgotten to breathe"    Hydromorphone GI:ID:[id number] Remicade [Infliximab] Anaphylaxis    Codeine Other and Unknown     "Feels like her skin is crawling"  Extreme feeling of something crawling  "Feels like her skin is crawling"  "Feels like her skin is crawling"    Nortriptyline Other and Unknown     Heart palpitations  Heart palpitations  Heart palpitations          Current Outpatient Medications   Medication Sig Dispense Refill    acetaminophen 500 MG tablet Take 2 tablets (1,000 mg) by mouth every 6 hours as needed.      albuterol HFA 108 (90 Base) MCG/ACT inhaler  (Patient not taking: Reported on 06/03/39/0867    certolizumab (Cimzia Starter Kit) 6 X 200 MG/ML prefilled syringe kit Inject 2 syringes (400 mg) under the skin initially, and at weeks  2 and 4 followed by 200 mg every other week 3 each 0    diazePAM 5 MG tablet Take 1 tablet (5 mg) by mouth 2 times a day as needed for muscle spasms. 8 tablet 0    fluticasone propionate HFA 220 MCG/ACT inhaler Inhale 1 puff by mouth. (Patient not taking: Reported on 05/29/2022)      leflunomide 20 MG tablet TAKE 1 TABLET DAILY 90 tablet 0    meloxicam 7.5 MG tablet TAKE 1 TO 2 TABLETS DAILY 180 tablet 1    oxyCODONE 5 MG tablet Take 1 tablet (5 mg) by mouth every 6 hours as needed for severe pain. 10 tablet 0    valACYclovir 1 g tablet        No current facility-administered medications for this visit.       REVIEW OF SYSTEMS  No  data filed         OBJECTIVE  There were no vitals taken for this visit.    LAB RESULTS  CBC, DIFF:  Recent Labs     05/06/22  1057   WBC 4.80   RBC 4.26   HEMOGLOBIN 13.7   HEMATOCRIT 42   MCV 98   MCH 32.2   MCHC 32.9   PLATELET 239   RDWCV 13.2   PERNEUT 57   PERLYMPH 32   PERMONO 10   PEREOS 0   PERBASO 1   PERIMG 0   ANEUT 2.77   ALYMPH 1.51   AMONO 0.48   AEOS 0.00   ABASO 0.03   AIMG 0.01   ANRBC 0.00   PERNRBC 0       CMP:  Recent Labs     05/06/22  1057   SODIUM 141   POTASSIUM 4.3   CL 110 H   CO2 23   IONGAP 8   GLUCOSE 73   BUN 12   CREATININE 0.52   PROTEIN 6.9   ALBUMIN 4.1   BILIRUBN 0.4   CA 9.0   AST 20   ALK 80   ALT 17   GFR >60       Hepatitis B Status:  Recent Labs     05/06/22  1057   HEPBSURFAG Nonreactive   HEPBSURFAB Nonreactive   IUAB <8.00   BCAB Nonreactive       Quant TB:  Quantiferon Nil Result   Date Value Ref Range Status   05/06/2022 0.24 IU gamma IF/mL Final     Quantiferon TB1 Ag minus Nil Result   Date Value Ref Range Status   05/06/2022 <0.00 IU gamma IF/mL Final     Quantiferon TB2 Ag minus Nil Result   Date Value Ref Range Status   05/06/2022 <0.00 IU gamma IF/mL Final     Quantiferon TB Interpretation   Date Value Ref Range Status   05/06/2022 Negative NRN Final     Comment:     NO evidence of Mycobacterium tuberculosis-specific memory T cell response.    Clinical correlation advised.       Note: There are rare cases of confirmed   active Mycobacterium tuberculosis infection in the presence of a negative   Quantiferon-TB test.       The following criteria must be met for the   Quantiferon-TB Gold assay to be interpreted as positive:       The Negative   control stimulus (Nil) must be <=8.0 IU gamma IF (interferon)/ml AND       a  TB antigen value (TB1 Ag or TB2 Ag) minus Nil value must be => 0.35 IU/ml, AND   =>25% of the Nil control value.       Quantiferon TB Mitogen minus Nil Result   Date Value Ref Range Status   05/06/2022 3.22 IU gamma IF/mL Final       Lipids:  Recent  Labs     02/11/22  1635 04/16/21  1125   CHOLESTEROL 148 183   TRIGLYCERIDE 142 122   LDL 73 93   HDL 50 66   CHOLHDLRATIO 3.0 2.8         ASSESSMENT/PLAN  No diagnosis found.     Navaeh Kehres is a 56 year old female beginning treatment with Cimzia2.  Labs are up to date.  Cimzia (certolizumab) Injection - Teaching points discussed with patient:     Reviewed prescription with patient: Inject '400mg'$  (given as two SC injections of '200mg'$ ) initially and at weeks 2 & 4. Then begin maintenance dose at 200 mg every 2 weeks.  Prior to injection, Cimzia should be stored in a refrigerator between 6F to 35F (2C to [place]) in its original container, protected from light.  Do not store in the freezer or directly adjacent to the refrigerator cooling element.  Do not freeze Cimzia and do not use if it has been frozen, even it has been thawed.     Refrigerated Cimzia may be used until the expiration date printed on the Cimzia carton, dose tray, and pen.   Gather supplies needed prior to injection: Cimzia syringes, alcohol swabs, cotton balls, sharps-disposal container.   Let the prefilled syringe come to room temperature, prior to injection.  Instructed patient on subcutaneous injection technique.   Do not inject Cimzia into skin that is bruised, sore, red, hard, scarred, or where you have stretch marks.    Inject at a 45 degree angle.   Do not recap needle, dispose of in a sharps container.   If your dose of Cimzia is 2 syringes (400 mg), repeat the injection technique for the second site, making sure that the new site is at least one inch away from the previous injection area.   Most commonly reported side effects of Cimzia include: upper respiratory infections (cough that does not go away, fever, flu-like symptoms), injection site irritation, urinary tract infection, rash, nausea.  Rare, but serious side effects of Cimzia include: allergic reaction, new or worsening heart failure (S/Sx: shortness of breath, swelling of  ankle or feet, sudden weight gain), blood problems (anemia, neutropenia, thrombocytopenia), new or worsening nervous system problems, an increased risk of lymphoma, or antibody development.   Discussed importance of not missing doses, if you forget to take Cimzia, inject as soon as you remember, then take your next dose at your regular scheduled time.   Reviewed importance of not receiving live vaccinations while taking Cimzia.   Instructed patient to contact primary care provider immediately with any signs of infection or open cuts/sores.     Assessment: Patient demonstrated good understanding of injection technique. Patient demonstrated understanding of teaching points above. Discussed when to expect improvement. Discussed vaccines recommended for the fall season.        Plan:   Patient to administer 2 syringes (400 mg) subcutaneously initially and at weeks 2 and 4.  Starting at week 6, maintenance dose of Cimzia is 200 mg (1 syringe) subcutaneously every 2 weeks.   Lab follow up and clinic visit in 8-12 weeks (CBC, CMP, CRP,  ESR), calling sooner PRN.        Possible Drug Interactions:   Clinically Relevant Drug Interactions Identified: yes  List Interactions: Leflunomide and Rinvoq- risk for hematologic toxicity such as pancytopenia, agranulocytosis, and/or thrombocytopenia may be increased.  Drug Management Plan: Monitoring         Medication Review:  No data filed         Follow-Up and Monitoring:    Adherence  No data filed         Education/Patient Counseling:  Counseled the Patient on the Following: possible food interactions, possible drug interactions, adherence and missed doses, cost of medications/cost implications, doses and administration, lab monitoring and follow-up, possible adverse effects and management, safe handling, storage, and disposal, therapeutic rationale, pharmacy contact information, duration of therapy, contraindications, safety precautions, vaccinations         I spent a total of 40  minutes for the patient's care on the date of the service.          Gweneth Dimitri, PharmD

## 2022-07-13 MED ORDER — CIMZIA (2 SYRINGE) 200 MG/ML SC PSKT
200.0000 mg | PREFILLED_SYRINGE | SUBCUTANEOUS | 1 refills | Status: DC
Start: 2022-07-13 — End: 2022-10-01

## 2022-07-20 ENCOUNTER — Ambulatory Visit (INDEPENDENT_AMBULATORY_CARE_PROVIDER_SITE_OTHER): Payer: No Typology Code available for payment source | Admitting: Neurological Surgery

## 2022-07-20 VITALS — Ht 61.0 in | Wt 170.0 lb

## 2022-07-20 DIAGNOSIS — M5416 Radiculopathy, lumbar region: Secondary | ICD-10-CM

## 2022-07-20 DIAGNOSIS — M48062 Spinal stenosis, lumbar region with neurogenic claudication: Secondary | ICD-10-CM

## 2022-07-20 DIAGNOSIS — M4150 Other secondary scoliosis, site unspecified: Secondary | ICD-10-CM

## 2022-07-20 NOTE — Progress Notes (Signed)
[facility] Neurosurgery  Provider: Rolene Course, MD  Visit Date: 07/20/2022      HPI:  Autumn Camacho is a 56 year old female who presents with a chief complaint of :  Back pain,   Leg pain/weakness   --right proximal leg weaker; left leg - pain    She has tingling sensation in the right proximal leg but has sensory deficit in the left leg    X 57mo- worsening    S/p steroid injection in Aug 2023 L5/S1 ESI with temporary relief.    The patient was referred by: Dr. SLorelee Market      REVIEW OF SYSTEMS:  ROS      CURRENT MEDICATIONS:  Current Outpatient Medications   Medication Sig Dispense Refill    acetaminophen 500 MG tablet Take 2 tablets (1,000 mg) by mouth every 6 hours as needed.      albuterol HFA 108 (90 Base) MCG/ACT inhaler  (Patient not taking: Reported on 87/82/4235      certolizumab (Cimzia Starter Kit) 6 X 200 MG/ML prefilled syringe kit Inject 2 syringes (400 mg) under the skin initially, and at weeks 2 and 4 followed by 200 mg every other week 3 each 0    certolizumab (Cimzia) 2 X 200 MG/ML prefilled syringe kit Inject 1 mL (200 mg) under the skin every 2 weeks. 2 each 1    diazePAM 5 MG tablet Take 1 tablet (5 mg) by mouth 2 times a day as needed for muscle spasms. (Patient not taking: Reported on 07/20/2022) 8 tablet 0    fluticasone propionate HFA 220 MCG/ACT inhaler Inhale 1 puff by mouth. (Patient not taking: Reported on 05/29/2022)      leflunomide 20 MG tablet TAKE 1 TABLET DAILY 90 tablet 0    meloxicam 7.5 MG tablet TAKE 1 TO 2 TABLETS DAILY 180 tablet 1    oxyCODONE 5 MG tablet Take 1 tablet (5 mg) by mouth every 6 hours as needed for severe pain. (Patient not taking: Reported on 07/20/2022) 10 tablet 0    valACYclovir 1 g tablet        No current facility-administered medications for this visit.       ALLERGIES:  Review of patient's allergies indicates:  Allergies   Allergen Reactions    Fentanyl Other and Unknown     Per patient, she has "forgotten to breathe"  Per  patient, she has "forgotten to breathe"  Per patient, she has "forgotten to breathe"    Hydromorphone GTI:RWERXV/[id number]   Remicade [Infliximab] Anaphylaxis    Codeine Other and Unknown     "Feels like her skin is crawling"  Extreme feeling of something crawling  "Feels like her skin is crawling"  "Feels like her skin is crawling"    Nortriptyline Other and Unknown     Heart palpitations  Heart palpitations  Heart palpitations       PAST MEDICAL HISTORY:  Past Medical History:   Diagnosis Date    Chronic right hip pain 02/26/2019    Allergic asthma     Fracture     Bilateral elbow fx    Genital herpes     Rheumatoid arthritis ([place]     [place]heumatoid arthritis ([place]     Spinal stenosis     Tooth disorder     MISSING TOOTH UPPER LEFT BACK-MOLAR       PAST SURGICAL HISTORY:  Past Surgical History:   Procedure Laterality Date    foot Bilateral  Arch surgery    KNEE ARTHROPLASTY      LEFT 2018 REPAIR 2019    KNEE ARTHROPLASTY Left 05/01/2020    PR ADENOIDECTOMY PRIMARY <AGE 17      AGE 57  T/A     PR ANES; TOTAL KNEE REPLACEMENT Left 04/01/2020    PR TONSILLECTOMY ONE-HALF <AGE 17      PR UNLISTED PROCEDURE FEMUR/KNEE Bilateral     TKA    PR UNLISTED PROCEDURE FEMUR/KNEE      Left revision TKA     PR UNLISTED PROCEDURE FOOT/TOES Bilateral 2009 R,  2010 L    Reconstructive surgery    PR UNLISTED PROCEDURE PELVIS/HIP JOINT      DOS 07/30/20 R THA    TKA Left 2019    revision from prior TKA       FAMILY HISTORY:  Family History       Problem (# of Occurrences) Relation (Name,Age of Onset)    Cancer (1) Father    Heart Attack (1) Father    Rheumatoid Arthritis (1) Other            SOCIAL HISTORY:  Social History     Tobacco Use    Smoking status: Former     Packs/day: 1.00     Years: 20.00     Pack years: 20.00     Types: Cigarettes     Quit date: 2015     Years since quitting: 8.7    Smokeless tobacco: Never    Tobacco comments:     hx : Off and on smoker but had vape , quit 2018.   Substance Use Topics    Alcohol use: Not  Currently     Comment: remote history of alcohol dependence    Drug use: Never       PHYSICAL EXAM:  Ht '5\' 1"'$  (1.549 m)   Wt 77.1 kg (170 lb)   BMI 32.12 kg/m   Neurological Exam  Mental Status  Awake and alert.    Motor                                               Right                     Left  Hip flexion                              3                          4+  Knee flexion                           4+                          5  Knee extension                      4+                          5  Plantarflexion  4-                          4-  Dorsiflexion                            4-                          4-  Toe extension                        4+                          4+    IMAGING:  MRI  1.  Multilevel degenerative disc disease causing severe spinal canal narrowing at L4-L5 and L2-L3. Overall these findings are similar to the previous exam.  2.  Severe neural foraminal stenosis at L5-S1 on the left and L4-L5 on the right.      IMPRESSION / ASSESSMENT:    ICD-10-CM    1. Degenerative scoliosis  M41.50 Dexa, Axial Skeleton W TBS Score     XR Spine  Entire  6 Or More Vw     CT L Spine wo Contrast      2. Lumbar radiculopathy  M54.16 Dexa, Axial Skeleton W TBS Score     XR Spine  Entire  6 Or More Vw     CT L Spine wo Contrast      3. Spinal stenosis of lumbar region with neurogenic claudication  M48.062 Dexa, Axial Skeleton W TBS Score     XR Spine  Entire  6 Or More Vw     CT L Spine wo Contrast          Imaging reviewed with the patient and her family.  She has significant stenosis at multiple levels and significant degenerative changes.    I counseled the patient that she likely requires surgical decompression at L2-3 as well as lumbar interbody fusion from L4-S1.  There is a fairly involved surgery and I would recommend we obtain additional imaging and discuss her case at the multidisciplinary spine conference and talk to her rheumatoid arthritis specialist Dr. Janan Halter prior  to considering surgical intervention.      Scoliosis XR  DEXA scan  CT scan  Spine conference    Prior EMR records reviewed as available and clinically relevant. All questions answered. Discharge and follow up instructions were discussed with the patient. The patient fully understands and is in agreement with the plan.    I spent a total of 30 minutes for the patient's care on the date of the service.          Portions of this chart were made using voice recognition software.

## 2022-07-23 ENCOUNTER — Other Ambulatory Visit (HOSPITAL_BASED_OUTPATIENT_CLINIC_OR_DEPARTMENT_OTHER): Payer: Self-pay | Admitting: Rheumatology

## 2022-07-23 DIAGNOSIS — M0579 Rheumatoid arthritis with rheumatoid factor of multiple sites without organ or systems involvement: Secondary | ICD-10-CM

## 2022-07-24 MED ORDER — LEFLUNOMIDE 20 MG OR TABS
ORAL_TABLET | ORAL | 1 refills | Status: DC
Start: 2022-07-24 — End: 2023-01-21

## 2022-07-29 ENCOUNTER — Encounter (INDEPENDENT_AMBULATORY_CARE_PROVIDER_SITE_OTHER): Payer: Self-pay | Admitting: Neurological Surgery

## 2022-07-30 ENCOUNTER — Ambulatory Visit
Admission: RE | Admit: 2022-07-30 | Discharge: 2022-07-30 | Disposition: A | Discharge status: Home / Self Care | Payer: No Typology Code available for payment source | Attending: Nuclear Radiology | Admitting: Nuclear Radiology

## 2022-07-30 ENCOUNTER — Ambulatory Visit (HOSPITAL_BASED_OUTPATIENT_CLINIC_OR_DEPARTMENT_OTHER): Payer: No Typology Code available for payment source

## 2022-07-30 DIAGNOSIS — M4150 Other secondary scoliosis, site unspecified: Secondary | ICD-10-CM | POA: Insufficient documentation

## 2022-07-30 DIAGNOSIS — M48062 Spinal stenosis, lumbar region with neurogenic claudication: Secondary | ICD-10-CM

## 2022-07-30 DIAGNOSIS — Z13820 Encounter for screening for osteoporosis: Secondary | ICD-10-CM | POA: Insufficient documentation

## 2022-07-30 DIAGNOSIS — M5416 Radiculopathy, lumbar region: Secondary | ICD-10-CM

## 2022-07-31 ENCOUNTER — Ambulatory Visit
Admission: RE | Admit: 2022-07-31 | Discharge: 2022-07-31 | Disposition: A | Discharge status: Home / Self Care | Payer: No Typology Code available for payment source | Attending: Diagnostic Radiology | Admitting: Diagnostic Radiology

## 2022-07-31 ENCOUNTER — Ambulatory Visit (HOSPITAL_BASED_OUTPATIENT_CLINIC_OR_DEPARTMENT_OTHER)
Admit: 2022-07-31 | Discharge: 2022-07-31 | Disposition: A | Discharge status: Home / Self Care | Payer: No Typology Code available for payment source | Source: Home / Self Care

## 2022-07-31 DIAGNOSIS — M4150 Other secondary scoliosis, site unspecified: Secondary | ICD-10-CM

## 2022-07-31 DIAGNOSIS — M5416 Radiculopathy, lumbar region: Secondary | ICD-10-CM

## 2022-07-31 DIAGNOSIS — M48062 Spinal stenosis, lumbar region with neurogenic claudication: Secondary | ICD-10-CM

## 2022-08-04 NOTE — Telephone Encounter (Signed)
Called Aleesha to advise. Message relayed.

## 2022-08-05 ENCOUNTER — Telehealth (HOSPITAL_BASED_OUTPATIENT_CLINIC_OR_DEPARTMENT_OTHER): Payer: Self-pay | Admitting: Rheumatology

## 2022-08-05 NOTE — Telephone Encounter (Signed)
F/u booked 08/24/2022 w/ Dr. Reesa Chew

## 2022-08-05 NOTE — Telephone Encounter (Signed)
Patient requested changing appointment with Dr. Janan Halter on 10/25 to telemedicine. Previous visit note did not indicate a return to clinic. 10/25 appointment changed to telemedicine.     Patient said if labs are needed, she can do them the Friday after scheduled appointment.

## 2022-08-10 ENCOUNTER — Encounter (INDEPENDENT_AMBULATORY_CARE_PROVIDER_SITE_OTHER): Payer: Self-pay | Admitting: Neurological Surgery

## 2022-08-10 NOTE — Telephone Encounter (Signed)
Opened in error

## 2022-08-12 ENCOUNTER — Ambulatory Visit: Payer: No Typology Code available for payment source | Admitting: Rheumatology

## 2022-08-12 ENCOUNTER — Encounter (HOSPITAL_BASED_OUTPATIENT_CLINIC_OR_DEPARTMENT_OTHER): Payer: Self-pay | Admitting: Rheumatology

## 2022-08-12 DIAGNOSIS — Z79899 Other long term (current) drug therapy: Secondary | ICD-10-CM

## 2022-08-12 DIAGNOSIS — M0579 Rheumatoid arthritis with rheumatoid factor of multiple sites without organ or systems involvement: Secondary | ICD-10-CM

## 2022-08-12 NOTE — Progress Notes (Signed)
[facility]      Rheumatology        Date of service: 08/12/22      Distant Site Telemedicine Encounter  I conducted this encounter via secure, live, face-to-face video conference with the patient. I reviewed the risks and benefits of telemedicine as pertinent to this visit and the patient agreed to proceed.     Provider Location: On-site location (clinic, hospital, on-site office)  Patient Location: At home  Present with patient: No one else present            PRIMARY CARE PROVIDER:  Terressa Koyanagi[place] [address]      PATIENT:       Autumn Camacho                          MRN: [id number]                          DOB: 07-06-1966     __________________________________  CHIEF COMPLAINT: Reese Senk is a 56 year old female being evaluated today for rheumatoid arthritis.      Reason for visit:    Rheumatoid arthritis      Last visit:  05/06/2022     RHEUMATOLOGIC HISTORY:  Diagnosis: rheumatoid arthritis (1999)   Autoimmune Serologies: Anti-CCP pos, RF pos, ANA neg   Medications Used: methotrexate, enbrel, remicade, humira, orencia, xeljanz, rinvoq, cimzia     INTERVAL HISTORY/HPI:  At her last visit, her condition appeared to be somewhat active and ordered surveillance lab tests. Her pain in her back was likely to be related to spinal stenosis rather than rheumatoid arthritis. I counseled her on treatment plans. I advised her to take Cimzia, a TNF inhibitor, to see if her symptoms improve since she previously responded well to Humira. I recommended she maintain meloxicam and leflunomide and discontinue Rinvoq upon starting Cimzia.     She presented to the ED from 06/30/2022 to 07/01/2022 for localized severe low back pain that radiated to her right leg, which also had numbness. She reported that she was concerned that she may have polymyalgia rheumatica.      She reports she took three doses of Cimzia and feels it is helpful. Her pain in hands and  elbows has improved and she notes mild stiffness and swelling in feet. She denies any adverse effects except mild injection site reactions.       She has severe back pain and is scheduled to see her neurologist in 11/6. She is considering having a back surgery for spinal stenosis.      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] 02/02/2020       Added automatically from request for surgery [id number]       Knee  dislocation, left, initial encounter [S83.105A] 01/16/2020    Rheumatoid arthritis involving right hand ([place]) [M06.9] 10/17/2019    Arthritis of carpometacarpal (CMC) joints of both thumbs [M18.0] 10/17/2019    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 [M19.041] 10/17/2019    Rheumatoid arthritis ([place]) [M06.9]      Rheumatoid arthritis involving multiple sites ([place]) [M06.9] 02/26/2019    Chronic right hip pain [M25.551, G89.29] 02/26/2019    Osteoarthritis of right knee [M17.11] 05/10/2017    Osteoarthritis of right hip [M16.11] 10/03/2016         MEDICATION ALLERGIES and ADVERSE REACTIONS:        Allergies as of 08/12/2022 - Reviewed 07/20/2022   Allergen Reaction Noted    Fentanyl Other and Unknown 03/20/2020    Hydromorphone JY:NWGNFA/OZHYQMVH 03/20/2020    Remicade [infliximab] Anaphylaxis 08/17/2018    Codeine Other and Unknown 08/17/2018    Nortriptyline Other and Unknown 04/08/2020         MEDICATIONS:  No outpatient medications have been marked as taking for the 08/12/22  encounter (Telemedicine) with Julaine Hua, MD.         MEDICAL HISTORY:  Medical History        Past Medical History:   Diagnosis Date    Chronic right hip pain 02/26/2019    Allergic asthma      Fracture       Bilateral elbow fx    Genital herpes      Rheumatoid arthritis ([place])      Rheumatoid arthritis ([place])      Spinal stenosis      Tooth disorder       MISSING TOOTH UPPER LEFT BACK-MOLAR            SURGICAL HISTORY:  Surgical History         Past Surgical History:   Procedure Laterality Date    foot Bilateral       Arch surgery    KNEE ARTHROPLASTY         LEFT 2018 REPAIR 2019    KNEE ARTHROPLASTY Left 05/01/2020    PR ADENOIDECTOMY PRIMARY <AGE 70         AGE 69  T/A     PR ANES; TOTAL KNEE REPLACEMENT Left 04/01/2020    PR TONSILLECTOMY ONE-HALF <AGE 70        PR UNLISTED PROCEDURE FEMUR/KNEE Bilateral       TKA    PR UNLISTED PROCEDURE FEMUR/KNEE         Left revision TKA     PR UNLISTED PROCEDURE FOOT/TOES Bilateral 2009 R,  2010 L     Reconstructive surgery    PR UNLISTED PROCEDURE PELVIS/HIP JOINT         DOS 07/30/20 R THA    TKA Left 2019     revision from prior TKA            IMMUNIZATION HISTORY:       Immunization History   Administered Date(s) Administered    COVID-19 Pfizer mRNA monovalent 12 yrs and older (purple cap) 01/10/2020, 01/27/2020, 06/06/2020    Hepatitis B recombinant (Heplisav-B) 05/08/2022    Influenza quadrivalent 07/20/2019    Pneumococcal conjugate PCV20 (Prevnar 20) 05/08/2022    Tdap 05/24/2019    Zoster recombinant (Shingrix) 06/22/2019, 07/20/2019, 08/26/2019         ALLERGIES: Fentanyl, Hydromorphone, Remicade [Infliximab], Codeine, and Nortriptyline     SOCIAL HISTORY:  reports that she quit smoking about 8 years ago. Her  smoking use included cigarettes. She has a 20.00 pack-year smoking history. She has never used smokeless tobacco. She reports that she does not currently use alcohol. She reports that she does not use drugs.     FAMILY HISTORY: family history includes  Cancer in her father; Heart Attack in her father; Rheumatoid Arthritis in an other family member.     PHYSICAL EXAMINATION:   Vitals: No vitals were taken at this visit   General:  Awake, alert, and oriented, no apparent distress, pleasant, and cooperative  Psychologic:  Mood is euthymic, affect is congruent  HEENT:  Normocephalic atraumatic, anicteric sclera, neck supple   Pulmonary:  Non-labored breathing.  Skin:  The patient did not show me any rashes or open ulcers.          Wt Readings from Last 10 Encounters:   07/20/22 77.1 kg (170 lb)   06/09/22 77.1 kg (170 lb)   02/24/22 76.7 kg (169 lb)   02/11/22 78.1 kg (172 lb 3.2 oz)   08/14/21 78 kg (172 lb)   08/08/21 77 kg (169 lb 12.1 oz)   04/16/21 79.4 kg (175 lb)   08/08/20 81.3 kg (179 lb 3.2 oz)   07/30/20 79.1 kg (174 lb 6.1 oz)   07/23/20 74.8 kg (165 lb)          BP Readings from Last 10 Encounters:   06/30/22 (!) 145/99   06/09/22 120/86   05/29/22 115/74   05/15/22 (!) 116/92   05/06/22 121/84   02/24/22 109/77   02/11/22 (!) 122/91   09/18/21 121/85   08/14/21 137/89   08/08/21 111/76         LABORATORY:        Results for orders placed or performed in visit on 05/06/22   Quantiferon-TB Gold Plus   Result Value Ref Range     Quantiferon Nil Result 0.24 IU gamma IF/mL     Quantiferon TB1 Ag minus Nil Result <0.00 IU gamma IF/mL     Quantiferon TB2 Ag minus Nil Result <0.00 IU gamma IF/mL     Quantiferon TB Mitogen minus Nil Result 3.22 IU gamma IF/mL     Quantiferon TB Interpretation Negative NRN         IMAGING:  None        IMPRESSION/RECOMMENDATIONS:      Mikel Pyon is a 56 year old female with history of bilateral knee and right hip replacements who came in today for evaluation of rheumatoid arthritis. She reported she completed the loading dose of Cimzia and found it was helpful. Her pain in hands and elbows has improved and she noted mild stiffness and swelling in feet. She denied any adverse effects except mild injection site  reactions. She had severe back pain and was scheduled to see her neurologist in 11/6. She was considering having a back surgery for spinal stenosis.      I discussed that her condition appeared to be responding well to Cimzia  and ordered surveillance lab tests. I counseled her on treatment plans. I advised her to maintain Cimzia and leflunomide to see if her condition continues to remain stable. She may hold Cimzia 2 to 3 weeks before her surgery. I will reach out to her after reviewing the labs.          Assessment:  Rheumatoid arthritis   Lumbar spinal stenosis     PLAN:  The following was discussed with the patient.  Recommendations are as follows:  Labs  today, as listed below:   Continue Cimzia '200mg'$  sc q2wks  Continue leflunomide '20mg'$  po qd  Continue meloxicam 7.'5mg'$  po qd prn     - Comprehensive Metabolic Panel  - CBC with Diff  - C-Reactive Protein  - Erythrocyte Sedimentation Rate        Follow up: 3 months

## 2022-08-12 NOTE — Progress Notes (Deleted)
[facility]     Rheumatology      Date of service: 08/12/22     Distant Site Telemedicine Encounter  I conducted this encounter via secure, live, face-to-face video conference with the patient. I reviewed the risks and benefits of telemedicine as pertinent to this visit and the patient agreed to proceed.    Provider Location: On-site location (clinic, hospital, on-site office)  Patient Location: At home  Present with patient: No one else present          PRIMARY CARE PROVIDER:  Terressa Koyanagi[place] [address]     PATIENT: Autumn Camacho    MRN: [id number]    DOB: 1966-02-03    __________________________________  CHIEF COMPLAINT: Autumn Camacho is a 56 year old female being evaluated today for rheumatoid arthritis.     Reason for visit:    Rheumatoid arthritis     Last visit:  05/06/2022    RHEUMATOLOGIC HISTORY:  Diagnosis: rheumatoid arthritis (1999)   Autoimmune Serologies: Anti-CCP pos, RF pos, ANA neg   Medications Used: methotrexate, enbrel, remicade, humira, orencia, xeljanz, rinvoq, cimzia    INTERVAL HISTORY/HPI:  At her last visit, her condition appeared to be somewhat active and ordered surveillance lab tests. Her pain in her back was likely to be related to spinal stenosis rather than rheumatoid arthritis. I counseled her on treatment plans. I advised her to take Cimzia, a TNF inhibitor, to see if her symptoms improve since she previously responded well to Humira. I recommended she maintain meloxicam and leflunomide and discontinue Rinvoq upon starting Cimzia.    She presented to the ED from 06/30/2022 to 07/01/2022 for localized severe low back pain that radiated to her right leg, which also had numbness. She reported that she was concerned that she may have polymyalgia rheumatica.     She reports she took three doses of Cimzia and feels it is helpful. Her pain in hands and elbows has improved and she notes mild stiffness and swelling in  feet. She denies any adverse effects except mild injection site reactions.      She has severe back pain and is scheduled to see her neurologist in 11/6. She is considering having a back surgery for spinal stenosis.     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] 05/10/2020     Added automatically from request for surgery [id number]      Failed total knee replacement, subsequent encounter [T84.018D, Z96.659] 05/01/2020    Chronic instability of left knee [M23.52] 04/16/2020     Added automatically from request for surgery [id number]      Failed total left knee replacement ([place]) [T84.093A] 02/02/2020     Added automatically from request for surgery [id number]      Knee dislocation, left, initial encounter [S83.105A] 01/16/2020    Rheumatoid arthritis involving right hand ([place]) [M06.9] 10/17/2019    Arthritis of carpometacarpal (CMC) joints of both thumbs [M18.0] 10/17/2019    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 [M19.041] 10/17/2019    Rheumatoid arthritis ([place]) [M06.9]     Rheumatoid arthritis involving multiple sites ([place]) [M06.9] 02/26/2019    Chronic right hip pain [M25.551, G89.29] 02/26/2019    Osteoarthritis of right knee [M17.11] 05/10/2017  Osteoarthritis of right hip [M16.11] 10/03/2016        MEDICATION ALLERGIES and ADVERSE REACTIONS:  Allergies as of 08/12/2022 - Reviewed 07/20/2022   Allergen Reaction Noted    Fentanyl Other and Unknown 03/20/2020    Hydromorphone NG:EXBMWU/XLKGMWNU 03/20/2020    Remicade [infliximab] Anaphylaxis 08/17/2018    Codeine Other and Unknown 08/17/2018    Nortriptyline Other and Unknown 04/08/2020       MEDICATIONS:  No outpatient medications have been marked as taking for the 08/12/22 encounter (Telemedicine) with Julaine Hua, MD.       MEDICAL HISTORY:  Past Medical History:    Diagnosis Date    Chronic right hip pain 02/26/2019    Allergic asthma     Fracture     Bilateral elbow fx    Genital herpes     Rheumatoid arthritis ([place])     Rheumatoid arthritis ([place])     Spinal stenosis     Tooth disorder     MISSING TOOTH UPPER LEFT BACK-MOLAR       SURGICAL HISTORY:  Past Surgical History:   Procedure Laterality Date    foot Bilateral     Arch surgery    KNEE ARTHROPLASTY      LEFT 2018 REPAIR 2019    KNEE ARTHROPLASTY Left 05/01/2020    PR ADENOIDECTOMY PRIMARY <AGE 12      AGE 71  T/A     PR ANES; TOTAL KNEE REPLACEMENT Left 04/01/2020    PR TONSILLECTOMY ONE-HALF <AGE 12      PR UNLISTED PROCEDURE FEMUR/KNEE Bilateral     TKA    PR UNLISTED PROCEDURE FEMUR/KNEE      Left revision TKA     PR UNLISTED PROCEDURE FOOT/TOES Bilateral 2009 R,  2010 L    Reconstructive surgery    PR UNLISTED PROCEDURE PELVIS/HIP JOINT      DOS 07/30/20 R THA    TKA Left 2019    revision from prior TKA       IMMUNIZATION HISTORY:  Immunization History   Administered Date(s) Administered    COVID-19 Pfizer mRNA monovalent 12 yrs and older (purple cap) 01/10/2020, 01/27/2020, 06/06/2020    Hepatitis B recombinant (Heplisav-B) 05/08/2022    Influenza quadrivalent 07/20/2019    Pneumococcal conjugate PCV20 (Prevnar 20) 05/08/2022    Tdap 05/24/2019    Zoster recombinant (Shingrix) 06/22/2019, 07/20/2019, 08/26/2019       ALLERGIES: Fentanyl, Hydromorphone, Remicade [Infliximab], Codeine, and Nortriptyline    SOCIAL HISTORY:  reports that she quit smoking about 8 years ago. Her smoking use included cigarettes. She has a 20.00 pack-year smoking history. She has never used smokeless tobacco. She reports that she does not currently use alcohol. She reports that she does not use drugs.    FAMILY HISTORY: family history includes Cancer in her father; Heart Attack in her father; Rheumatoid Arthritis in an other family member.    PHYSICAL EXAMINATION:   Vitals: No vitals were taken at this visit   General:  Awake, alert, and  oriented, no apparent distress, pleasant, and cooperative  Psychologic:  Mood is euthymic, affect is congruent  HEENT:  Normocephalic atraumatic, anicteric sclera, neck supple   Pulmonary:  Non-labored breathing.  Skin:  The patient did not show me any rashes or open ulcers.     Wt Readings from Last 10 Encounters:   07/20/22 77.1 kg (170 lb)   06/09/22 77.1 kg (170 lb)   02/24/22 76.7 kg (169 lb)   02/11/22 78.1 kg (  172 lb 3.2 oz)   08/14/21 78 kg (172 lb)   08/08/21 77 kg (169 lb 12.1 oz)   04/16/21 79.4 kg (175 lb)   08/08/20 81.3 kg (179 lb 3.2 oz)   07/30/20 79.1 kg (174 lb 6.1 oz)   07/23/20 74.8 kg (165 lb)     BP Readings from Last 10 Encounters:   06/30/22 (!) 145/99   06/09/22 120/86   05/29/22 115/74   05/15/22 (!) 116/92   05/06/22 121/84   02/24/22 109/77   02/11/22 (!) 122/91   09/18/21 121/85   08/14/21 137/89   08/08/21 111/76       LABORATORY:  Results for orders placed or performed in visit on 05/06/22   Quantiferon-TB Gold Plus   Result Value Ref Range    Quantiferon Nil Result 0.24 IU gamma IF/mL    Quantiferon TB1 Ag minus Nil Result <0.00 IU gamma IF/mL    Quantiferon TB2 Ag minus Nil Result <0.00 IU gamma IF/mL    Quantiferon TB Mitogen minus Nil Result 3.22 IU gamma IF/mL    Quantiferon TB Interpretation Negative NRN       IMAGING:  None      IMPRESSION/RECOMMENDATIONS:     Octavia Velador is a 56 year old female with history of bilateral knee and right hip replacements who came in today for evaluation of rheumatoid arthritis. She reported she completed the loading dose of Cimzia and found it was helpful. Her pain in hands and elbows has improved and she noted mild stiffness and swelling in feet. She denied any adverse effects except mild injection site reactions. She had severe back pain and was scheduled to see her neurologist in 11/6. She was considering having a back surgery for spinal stenosis.      I discussed that her condition appeared to be responding well to Cimzia  and  ordered surveillance lab tests. I counseled her on treatment plans. I advised her to maintain Cimzia and leflunomide to see if her condition continues to remain stable. She may hold Cimzia 2 to 3 weeks before her surgery. I will reach out to her after reviewing the labs.          Assessment:  Rheumatoid arthritis   Lumbar spinal stenosis     PLAN:  The following was discussed with the patient.  Recommendations are as follows:  Labs today, as listed below:   Continue Cimzia '200mg'$  sc q2wks  Continue leflunomide '20mg'$  po qd  Continue meloxicam 7.'5mg'$  po qd prn     - Comprehensive Metabolic Panel  - CBC with Diff  - C-Reactive Protein  - Erythrocyte Sedimentation Rate        Follow up: 3 months

## 2022-08-14 ENCOUNTER — Ambulatory Visit: Payer: No Typology Code available for payment source | Attending: Rheumatology

## 2022-08-14 DIAGNOSIS — Z79899 Other long term (current) drug therapy: Secondary | ICD-10-CM | POA: Insufficient documentation

## 2022-08-14 DIAGNOSIS — M0579 Rheumatoid arthritis with rheumatoid factor of multiple sites without organ or systems involvement: Secondary | ICD-10-CM | POA: Insufficient documentation

## 2022-08-14 LAB — CBC, DIFF
% Basophils: 1 %
% Eosinophils: 1 %
% Immature Granulocytes: 0 %
% Lymphocytes: 38 %
% Monocytes: 11 %
% Neutrophils: 49 %
% Nucleated RBC: 0 %
Absolute Eosinophil Count: 0.06 10*3/uL (ref 0.00–0.50)
Absolute Lymphocyte Count: 1.89 10*3/uL (ref 1.00–4.80)
Basophils: 0.03 10*3/uL (ref 0.00–0.20)
Hematocrit: 44 % (ref 36.0–45.0)
Hemoglobin: 13.8 g/dL (ref 11.5–15.5)
Immature Granulocytes: 0.01 10*3/uL (ref 0.00–0.05)
MCH: 31.5 pg (ref 27.3–33.6)
MCHC: 31.6 g/dL — ABNORMAL LOW (ref 32.2–36.5)
MCV: 100 fL — ABNORMAL HIGH (ref 81–98)
Monocytes: 0.56 10*3/uL (ref 0.00–0.80)
Neutrophils: 2.37 10*3/uL (ref 1.80–7.00)
Nucleated RBC: 0 10*3/uL
Platelet Count: 243 10*3/uL (ref 150–400)
RBC: 4.38 10*6/uL (ref 3.80–5.00)
RDW-CV: 13.1 % (ref 11.0–14.5)
WBC: 4.92 10*3/uL (ref 4.3–10.0)

## 2022-08-14 LAB — COMPREHENSIVE METABOLIC PANEL
ALT (GPT): 17 U/L (ref 7–33)
AST (GOT): 18 U/L (ref 9–38)
Albumin: 4.1 g/dL (ref 3.5–5.2)
Alkaline Phosphatase (Total): 68 U/L (ref 34–121)
Anion Gap: 8 (ref 4–12)
Bilirubin (Total): 0.4 mg/dL (ref 0.2–1.3)
Calcium: 9.5 mg/dL (ref 8.9–10.2)
Carbon Dioxide, Total: 27 meq/L (ref 22–32)
Chloride: 108 meq/L (ref 98–108)
Creatinine: 0.49 mg/dL (ref 0.38–1.02)
Glucose: 76 mg/dL (ref 62–125)
Potassium: 5.3 meq/L — ABNORMAL HIGH (ref 3.6–5.2)
Protein (Total): 6.8 g/dL (ref 6.0–8.2)
Sodium: 143 meq/L (ref 135–145)
Urea Nitrogen: 12 mg/dL (ref 8–21)
eGFR by CKD-EPI 2021: >60 mL/min/{1.73_m2} (ref >59)

## 2022-08-14 LAB — C_REACTIVE PROTEIN: C_Reactive Protein: 1.7 mg/L (ref 0.0–10.0)

## 2022-08-14 LAB — SED RATE: Erythrocyte Sedimentation Rate: 23 mm/h — ABNORMAL HIGH (ref 0–20)

## 2022-08-20 ENCOUNTER — Inpatient Hospital Stay
Admission: AD | Admit: 2022-08-20 | Discharge: 2022-08-23 | DRG: 472 | Disposition: A | Discharge status: Home / Self Care | Payer: No Typology Code available for payment source | Attending: Neurological Surgery | Admitting: Neurological Surgery

## 2022-08-20 ENCOUNTER — Emergency Department (EMERGENCY_DEPARTMENT_HOSPITAL): Payer: No Typology Code available for payment source

## 2022-08-20 ENCOUNTER — Emergency Department (HOSPITAL_COMMUNITY)
Admission: EM | Admit: 2022-08-20 | Discharge: 2022-08-20 | Disposition: A | Discharge status: Other Acute Inpatient Hospital | Payer: No Typology Code available for payment source | Source: Home / Self Care | Attending: Student in an Organized Health Care Education/Training Program | Admitting: Student in an Organized Health Care Education/Training Program

## 2022-08-20 ENCOUNTER — Other Ambulatory Visit: Payer: Self-pay

## 2022-08-20 ENCOUNTER — Encounter (INDEPENDENT_AMBULATORY_CARE_PROVIDER_SITE_OTHER): Payer: Self-pay | Admitting: Neurological Surgery

## 2022-08-20 ENCOUNTER — Emergency Department (HOSPITAL_COMMUNITY): Payer: No Typology Code available for payment source

## 2022-08-20 ENCOUNTER — Encounter (HOSPITAL_COMMUNITY): Payer: Self-pay

## 2022-08-20 ENCOUNTER — Inpatient Hospital Stay (HOSPITAL_COMMUNITY): Payer: Self-pay | Admitting: Neurological Surgery

## 2022-08-20 DIAGNOSIS — M542 Cervicalgia: Principal | ICD-10-CM

## 2022-08-20 DIAGNOSIS — M5116 Intervertebral disc disorders with radiculopathy, lumbar region: Secondary | ICD-10-CM | POA: Diagnosis present

## 2022-08-20 DIAGNOSIS — Z8249 Family history of ischemic heart disease and other diseases of the circulatory system: Secondary | ICD-10-CM

## 2022-08-20 DIAGNOSIS — M0579 Rheumatoid arthritis with rheumatoid factor of multiple sites without organ or systems involvement: Secondary | ICD-10-CM | POA: Diagnosis present

## 2022-08-20 DIAGNOSIS — Z809 Family history of malignant neoplasm, unspecified: Secondary | ICD-10-CM

## 2022-08-20 DIAGNOSIS — Z6832 Body mass index (BMI) 32.0-32.9, adult: Secondary | ICD-10-CM

## 2022-08-20 DIAGNOSIS — G8918 Other acute postprocedural pain: Secondary | ICD-10-CM | POA: Diagnosis not present

## 2022-08-20 DIAGNOSIS — M5106 Intervertebral disc disorders with myelopathy, lumbar region: Secondary | ICD-10-CM | POA: Diagnosis present

## 2022-08-20 DIAGNOSIS — M1611 Unilateral primary osteoarthritis, right hip: Secondary | ICD-10-CM | POA: Diagnosis present

## 2022-08-20 DIAGNOSIS — M48061 Spinal stenosis, lumbar region without neurogenic claudication: Secondary | ICD-10-CM | POA: Diagnosis present

## 2022-08-20 DIAGNOSIS — G834 Cauda equina syndrome: Secondary | ICD-10-CM | POA: Diagnosis present

## 2022-08-20 DIAGNOSIS — Z886 Allergy status to analgesic agent status: Secondary | ICD-10-CM

## 2022-08-20 DIAGNOSIS — M4802 Spinal stenosis, cervical region: Principal | ICD-10-CM | POA: Diagnosis present

## 2022-08-20 DIAGNOSIS — M47816 Spondylosis without myelopathy or radiculopathy, lumbar region: Secondary | ICD-10-CM

## 2022-08-20 DIAGNOSIS — G4733 Obstructive sleep apnea (adult) (pediatric): Secondary | ICD-10-CM | POA: Diagnosis present

## 2022-08-20 DIAGNOSIS — Z888 Allergy status to other drugs, medicaments and biological substances status: Secondary | ICD-10-CM

## 2022-08-20 DIAGNOSIS — M5416 Radiculopathy, lumbar region: Secondary | ICD-10-CM

## 2022-08-20 DIAGNOSIS — Z96652 Presence of left artificial knee joint: Secondary | ICD-10-CM | POA: Diagnosis present

## 2022-08-20 DIAGNOSIS — M4804 Spinal stenosis, thoracic region: Secondary | ICD-10-CM

## 2022-08-20 DIAGNOSIS — Z87891 Personal history of nicotine dependence: Secondary | ICD-10-CM

## 2022-08-20 DIAGNOSIS — G3189 Other specified degenerative diseases of nervous system: Secondary | ICD-10-CM

## 2022-08-20 DIAGNOSIS — E669 Obesity, unspecified: Secondary | ICD-10-CM | POA: Diagnosis present

## 2022-08-20 DIAGNOSIS — R159 Full incontinence of feces: Secondary | ICD-10-CM

## 2022-08-20 DIAGNOSIS — G8929 Other chronic pain: Secondary | ICD-10-CM

## 2022-08-20 DIAGNOSIS — M1711 Unilateral primary osteoarthritis, right knee: Secondary | ICD-10-CM | POA: Diagnosis present

## 2022-08-20 DIAGNOSIS — M5136 Other intervertebral disc degeneration, lumbar region: Secondary | ICD-10-CM | POA: Diagnosis present

## 2022-08-20 DIAGNOSIS — W1830XA Fall on same level, unspecified, initial encounter: Secondary | ICD-10-CM | POA: Diagnosis present

## 2022-08-20 DIAGNOSIS — R32 Unspecified urinary incontinence: Secondary | ICD-10-CM

## 2022-08-20 DIAGNOSIS — Z885 Allergy status to narcotic agent status: Secondary | ICD-10-CM

## 2022-08-20 LAB — CBC, DIFF
% Basophils: 0 %
% Eosinophils: 0 %
% Immature Granulocytes: 0 %
% Lymphocytes: 39 %
% Monocytes: 11 %
% Neutrophils: 50 %
% Nucleated RBC: 0 %
Absolute Eosinophil Count: 0.01 10*3/uL (ref 0.00–0.50)
Absolute Lymphocyte Count: 2.12 10*3/uL (ref 1.00–4.80)
Basophils: 0.02 10*3/uL (ref 0.00–0.20)
Hematocrit: 39 % (ref 36.0–45.0)
Hemoglobin: 12.9 g/dL (ref 11.5–15.5)
Immature Granulocytes: 0.01 10*3/uL (ref 0.00–0.05)
MCH: 32.4 pg (ref 27.3–33.6)
MCHC: 32.8 g/dL (ref 32.2–36.5)
MCV: 99 fL — ABNORMAL HIGH (ref 81–98)
Monocytes: 0.59 10*3/uL (ref 0.00–0.80)
Neutrophils: 2.68 10*3/uL (ref 1.80–7.00)
Nucleated RBC: 0 10*3/uL
Platelet Count: 194 10*3/uL (ref 150–400)
RBC: 3.98 10*6/uL (ref 3.80–5.00)
RDW-CV: 13.1 % (ref 11.0–14.5)
WBC: 5.43 10*3/uL (ref 4.3–10.0)

## 2022-08-20 LAB — COMPREHENSIVE METABOLIC PANEL
ALT (GPT): 18 U/L (ref 7–33)
AST (GOT): 17 U/L (ref 9–38)
Albumin: 3.6 g/dL (ref 3.5–5.2)
Alkaline Phosphatase (Total): 61 U/L (ref 34–121)
Anion Gap: 10 (ref 4–12)
Bilirubin (Total): 0.3 mg/dL (ref 0.2–1.3)
Calcium: 8.9 mg/dL (ref 8.9–10.2)
Carbon Dioxide, Total: 21 meq/L — ABNORMAL LOW (ref 22–32)
Chloride: 110 meq/L — ABNORMAL HIGH (ref 98–108)
Creatinine: 0.49 mg/dL (ref 0.38–1.02)
Glucose: 76 mg/dL (ref 62–125)
Potassium: 3.6 meq/L (ref 3.6–5.2)
Protein (Total): 6.3 g/dL (ref 6.0–8.2)
Sodium: 141 meq/L (ref 135–145)
Urea Nitrogen: 12 mg/dL (ref 8–21)
eGFR by CKD-EPI 2021: >60 mL/min/{1.73_m2} (ref >59)

## 2022-08-20 LAB — SARS-COV-2 (COVID-19) QUALITATIVE RAPID PCR: COVID-19 Coronavirus Qual PCR Result: NOT DETECTED

## 2022-08-20 LAB — PARTIAL THROMBOPLASTIN TIME: Partial Thromboplastin Time: 31 s (ref 22–35)

## 2022-08-20 LAB — 1ST EXTRA PEARL TOP

## 2022-08-20 LAB — PROTHROMBIN TIME
Prothrombin INR: 1 (ref 0.8–1.3)
Prothrombin Time Patient: 13.1 s (ref 10.7–15.6)

## 2022-08-20 LAB — 1ST EXTRA GOLD TOP

## 2022-08-20 MED ORDER — BISACODYL 5 MG OR TBEC
10.0000 mg | DELAYED_RELEASE_TABLET | Freq: Every day | ORAL | Status: DC | PRN
Start: 2022-08-20 — End: 2022-08-23

## 2022-08-20 MED ORDER — SENNOSIDES 8.6 MG OR TABS
17.2000 mg | ORAL_TABLET | Freq: Two times a day (BID) | ORAL | Status: DC
Start: 2022-08-20 — End: 2022-08-23
  Administered 2022-08-21 (×2): 17.2 mg via ORAL
  Filled 2022-08-20 (×3): qty 2

## 2022-08-20 MED ORDER — MELATONIN 3 MG OR TABS
3.0000 mg | ORAL_TABLET | Freq: Every evening | ORAL | Status: DC | PRN
Start: 2022-08-20 — End: 2022-08-23

## 2022-08-20 MED ORDER — POLYETHYLENE GLYCOL 3350 17 G OR PACK
17.0000 g | PACK | Freq: Every day | ORAL | Status: DC
Start: 2022-08-21 — End: 2022-08-23
  Filled 2022-08-20: qty 1

## 2022-08-20 MED ORDER — LIDOCAINE 4 % EX PTCH
1.0000 | MEDICATED_PATCH | Freq: Once | CUTANEOUS | Status: DC
Start: 2022-08-20 — End: 2022-08-20
  Administered 2022-08-20: 1 via TRANSDERMAL
  Filled 2022-08-20: qty 1

## 2022-08-20 MED ORDER — GABAPENTIN 600 MG OR TABS
600.0000 mg | ORAL_TABLET | Freq: Every evening | ORAL | Status: DC
Start: 2022-08-21 — End: 2022-08-23
  Administered 2022-08-21: 600 mg via ORAL
  Filled 2022-08-20: qty 1

## 2022-08-20 MED ORDER — GABAPENTIN 100 MG OR CAPS
300.0000 mg | ORAL_CAPSULE | Freq: Once | ORAL | Status: AC
Start: 2022-08-20 — End: 2022-08-20
  Administered 2022-08-20: 300 mg via ORAL
  Filled 2022-08-20: qty 3

## 2022-08-20 MED ORDER — TRAZODONE HCL 25 MG SPLIT TABLET
25.0000 mg | Freq: Every evening | ORAL | Status: DC | PRN
Start: 2022-08-20 — End: 2022-08-21

## 2022-08-20 MED ORDER — ACETAMINOPHEN 500 MG OR TABS
1000.0000 mg | ORAL_TABLET | Freq: Once | ORAL | Status: AC
Start: 2022-08-20 — End: 2022-08-20
  Administered 2022-08-20: 1000 mg via ORAL
  Filled 2022-08-20: qty 2

## 2022-08-20 MED ORDER — ONDANSETRON HCL 4 MG/2ML IJ SOLN
4.0000 mg | Freq: Three times a day (TID) | INTRAMUSCULAR | Status: DC | PRN
Start: 2022-08-20 — End: 2022-08-23

## 2022-08-20 MED ORDER — ONDANSETRON HCL 4 MG OR TABS
4.0000 mg | ORAL_TABLET | Freq: Three times a day (TID) | ORAL | Status: DC | PRN
Start: 2022-08-20 — End: 2022-08-23

## 2022-08-20 MED ORDER — GABAPENTIN 300 MG OR CAPS
300.0000 mg | ORAL_CAPSULE | Freq: Once | ORAL | Status: AC
Start: 2022-08-20 — End: 2022-08-20
  Administered 2022-08-20: 300 mg via ORAL
  Filled 2022-08-20: qty 1

## 2022-08-20 MED ORDER — METOCLOPRAMIDE HCL 5 MG/ML IJ SOLN
5.0000 mg | Freq: Four times a day (QID) | INTRAMUSCULAR | Status: DC | PRN
Start: 2022-08-20 — End: 2022-08-23

## 2022-08-20 MED ORDER — GABAPENTIN 300 MG OR CAPS
300.0000 mg | ORAL_CAPSULE | Freq: Two times a day (BID) | ORAL | Status: DC
Start: 2022-08-21 — End: 2022-08-21

## 2022-08-20 MED ORDER — OXYCODONE HCL 5 MG OR TABS
5.0000 mg | ORAL_TABLET | ORAL | Status: DC | PRN
Start: 2022-08-20 — End: 2022-08-21

## 2022-08-20 MED ORDER — BISACODYL 10 MG RE SUPP
10.0000 mg | Freq: Every day | RECTAL | Status: DC | PRN
Start: 2022-08-20 — End: 2022-08-23

## 2022-08-20 MED ORDER — ACETAMINOPHEN 325 MG OR TABS
650.0000 mg | ORAL_TABLET | ORAL | Status: DC | PRN
Start: 2022-08-20 — End: 2022-08-21
  Administered 2022-08-21 (×2): 650 mg via ORAL
  Filled 2022-08-20 (×2): qty 2

## 2022-08-20 NOTE — ED Notes (Signed)
Patient reports low back pain 8/10 since yesterday. Patient fell last night due to numbness in bilateral feet.     HX; RA     124/84  80  98%   16     Waylan Rocher, [place]  08/20/22 1302

## 2022-08-20 NOTE — ED Notes (Signed)
Provided opportunity to ask questions and gave answers.     Gae Dry, RN  08/20/22 1913

## 2022-08-20 NOTE — Telephone Encounter (Addendum)
Advised pt to go to the ED for evaluation for her progressive weakness and bowel changes and also considering the severe stenosis shown in the last MRI. Pt agrees to this.   Naija Troost, RN

## 2022-08-20 NOTE — ED Provider Notes (Signed)
CHIEF COMPLAINT   Chief Complaint   Patient presents with    Back Pain    Fall            HISTORY OF PRESENT ILLNESS AND REVIEW OF SYSTEMS        56 year old female with history of known spinal stenosis presenting to the emergency department with worsening back pain, new numbness to left leg, chronic, unchanged numbness to right leg along with progressive incontinence of stool and worsening balance issues/gait instability.  Patient states that her symptoms have been gradually worsening over the course the past 3-weeks without any clear inciting incident.  Patient states she has chronic numbness to right leg although has had new numbness to left leg over the course the past week after feeling a popping sensation in her back, numbness is worse along the posterior aspect of the leg and bottom of the foot described as "like what it feels when you wake up in your arm is asleep."  Patient states that she has had some prior stool incontinence noting that she has to immediately go to the bathroom after consuming meals as she is not able to keep in her bowel movements and is not able to sense when she has any leakage, this has been progressive over the course the past 3 weeks.  Patient had a soft fall this afternoon when standing up from a recliner when her knees gave out on her, landed on her bottom, denies any head impact or significant worsening pain following this.  Patient states that her neurosurgical team referred her to the emergency department for MRI, has follow-up later this week.  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-MOLAR       Past Surgical  History:   Procedure Laterality Date    foot Bilateral     Arch surgery    KNEE ARTHROPLASTY      LEFT 2018 REPAIR 2019    KNEE ARTHROPLASTY Left 05/01/2020    PR ADENOIDECTOMY PRIMARY <AGE 45      AGE 59  T/A     PR ANES; TOTAL KNEE REPLACEMENT Left 04/01/2020    PR TONSILLECTOMY ONE-HALF <AGE 45      PR UNLISTED PROCEDURE FEMUR/KNEE Bilateral     TKA    PR UNLISTED PROCEDURE FEMUR/KNEE      Left revision TKA     PR UNLISTED PROCEDURE FOOT/TOES Bilateral 2009 R,  2010 L    Reconstructive surgery    PR UNLISTED PROCEDURE PELVIS/HIP JOINT      DOS 07/30/20 R THA    TKA Left 2019    revision from prior TKA          MEDICATIONS AND ALLERGIES     OUTPATIENT MEDICATIONS:   Current Outpatient Medications   Medication Instructions    acetaminophen (TYLENOL) 1,000 mg, Oral, Every 6 hours PRN    albuterol HFA 108 (90 Base) MCG/ACT inhaler No dose, route, or frequency recorded.  certolizumab (Cimzia Starter Kit) 6 X 200 MG/ML prefilled syringe kit Inject 2 syringes (400 mg) under the skin initially, and at weeks 2 and 4 followed by 200 mg every other week    Cimzia 200 mg, Subcutaneous, Every 2 weeks    diazePAM (VALIUM) 5 mg, Oral, 2 times daily PRN    fluticasone propionate HFA 220 MCG/ACT inhaler 1 puff    leflunomide 20 MG tablet TAKE 1 TABLET DAILY    meloxicam 7.5 MG tablet TAKE 1 TO 2 TABLETS DAILY    oxyCODONE 5 mg, Oral, Every 6 hours PRN    valACYclovir 1 g tablet No dose, route, or frequency recorded.       ALLERGIES:   Fentanyl, Hydromorphone, Remicade [infliximab], Codeine, and Nortriptyline              SOCIAL HISTORY AND FAMILY HISTORY   Social History     Tobacco Use    Smoking status: Former     Packs/day: 1.00     Years: 20.00     Pack years: 20.00     Types: Cigarettes     Quit date: 2015     Years since quitting: 8.8    Smokeless tobacco: Never    Tobacco comments:     hx : Off and on smoker but had vape , quit 2018.   Substance Use Topics    Alcohol use: Not Currently     Comment: remote history of  alcohol dependence    Drug use: Never       Family History       Problem (# of Occurrences) Relation (Name,Age of Onset)    Cancer (1) Father    Heart Attack (1) Father    Rheumatoid Arthritis (1) Other                  PHYSICAL EXAM   ED VITALS:  Vitals (Arrival)      T: 36.8 C (08/20/22 1337)  BP: 128/89 (08/20/22 1333)  HR: 78 (08/20/22 1333)  RR: 16 (08/20/22 1333)  SpO2: 94 % (08/20/22 1333)     Vitals (Most recent in last 24 hrs)   T: 36.8 C (08/20/22 1337)  BP: 122/76 (08/20/22 1900)  HR: 83 (08/20/22 1900)  RR: 16 (08/20/22 1333)  SpO2: 93 % (08/20/22 1900) Room air  T range: Temp  Min: 36.8 C  Max: 36.8 C  (no weight taken for this visit)     (no height taken for this visit)     There is no height or weight on file to calculate BMI.       Physical Exam  General- Lying in bed, nontoxic-appearing, no acute distress  HEENT- PERRLA, white sclera   Cardiovascular - RRR, warm and well perfused  Lungs - Breathing comfortably on room air, no audible stridor, no increased WOB  Abdomen - abdomen soft and nontender  Back-mild tenderness to lower T-spine and mid/lower L-spine without any step-offs, bruising  Extremities - No edema, DP and radial pulses 2+  Skin-Warm and dry  Musculoskeletal - No deformity, no swollen or erythematous joints.  Neurological- Alert and oriented x 3, 5/5 strength in all extremities including with plantarflexion/dorsiflexion, bilateral lower extremity numbness which is worse on the right compared to the left to light touch and pinprick        LABORATORY:   Labs Reviewed   CBC, DIFF - Abnormal       Result Value    WBC 5.43  RBC 3.98      Hemoglobin 12.9      Hematocrit 39      MCV 99 (*)     MCH 32.4      MCHC 32.8      Platelet Count 194      RDW-CV 13.1      % Neutrophils 50      % Lymphocytes 39      % Monocytes 11      % Eosinophils 0      % Basophils 0      % Immature Granulocytes 0      Neutrophils 2.68      Absolute Lymphocyte Count 2.12      Monocytes 0.59      Absolute  Eosinophil Count 0.01      Basophils 0.02      Immature Granulocytes 0.01      Nucleated RBC 0.00      % Nucleated RBC 0     COMPREHENSIVE METABOLIC PANEL - Abnormal    Sodium 141      Potassium 3.6      Chloride 110 (*)     Carbon Dioxide, Total 21 (*)     Anion Gap 10      Glucose 76      Urea Nitrogen 12      Creatinine 0.49      Protein (Total) 6.3      Albumin 3.6      Bilirubin (Total) 0.3      Calcium 8.9      AST (GOT) 17      Alkaline Phosphatase (Total) 61      ALT (GPT) 18      eGFR by CKD-EPI 2021 >60     SARS-COV-2 (COVID-19) QUALITATIVE RAPID PCR    COVID-19 Coronavirus Qual PCR Specimen Type Nasal swab      COVID-19 Coronavirus Qual PCR Result None detected      COVID-19 Coronavirus Qual PCR Interpretation        Value: This is a negative result. Laboratory testing alone cannot rule out infection, particularly in the presence of clinical risk factors such as symptoms or exposure history.    COVID-19 Qualitative PCR Indication Admission Surveillance     PARTIAL THROMBOPLASTIN TIME    Partial Thromboplastin Time 31      Partial Thromboplastin X Mean        Value: To calculate the PTT X Mean divide PTT value by 29.   PROTHROMBIN TIME    Prothrombin Time Patient 13.1      Prothrombin INR 1.0           IMAGING:     ED Wet Read -   MRI L Spine wo Contrast   Final Result   1.  MR thoracic spine: Degenerative change at C7-T1 resulting in at least moderate spinal canal stenosis with flattening/compression of the cord and moderate to severe bilateral foraminal narrowing. There is associated with partially visualized intramedullary signal alteration within left aspect of the cord at the C7-T1 levels raising concern for myelopathy, potentially compressive in etiology. Correlate with dedicated MRI cervical spine. No additional evidence of high-grade stenosis or occlusion within the thoracic spine.      2.  MR lumbar spine: Similar appearance of multilevel degenerative change which appears most significant at L4-L5  resulting in severe spinal canal stenosis with compression of the cauda equina nerve roots and severe right-sided foraminal narrowing with compression of the exiting right L4 nerve  root.      MRI T Spine wo Contrast   Final Result   1.  MR thoracic spine: Degenerative change at C7-T1 resulting in at least moderate spinal canal stenosis with flattening/compression of the cord and moderate to severe bilateral foraminal narrowing. There is associated with partially visualized intramedullary signal alteration within left aspect of the cord at the C7-T1 levels raising concern for myelopathy, potentially compressive in etiology. Correlate with dedicated MRI cervical spine. No additional evidence of high-grade stenosis or occlusion within the thoracic spine.      2.  MR lumbar spine: Similar appearance of multilevel degenerative change which appears most significant at L4-L5 resulting in severe spinal canal stenosis with compression of the cauda equina nerve roots and severe right-sided foraminal narrowing with compression of the exiting right L4 nerve root.          Radiology Final Result -   No image results found.              EKG DOCUMENTATION                 SUICIDE RISK EVALUATION             SEPSIS               ED COURSE/MEDICAL DECISION MAKING        56 year old female with history of known spinal stenosis presenting to the emergency department with worsening back pain, new numbness to left leg, chronic, unchanged numbness to right leg along with progressive incontinence of stool and worsening balance issues/gait instability.  Patient's prior MRI results showing lumbar spinal stenosis reviewed.  Differential included worsening spinal stenosis, cauda equina, epidural abscess, lumbar strain, vertebral fracture, among others.  History examination per above notable for nontoxic-appearing patient who reports bilateral right greater than left lower extremity numbness although without any clear strength deficits.   Patient is endorsing worsening incontinence of stool which is concerning for progressive lumbar cord compression for which repeat MRI was ordered.  MRI concerning for cauda equina with severe right-sided foraminal narrowing which is discussed with neurosurgery which given ongoing progressive nature of her symptoms which are now interfering with her ability to safely ambulate at home and maintain continence of stool was deemed to require transfer to [place] for neurosurgical intervention.  Patient with unchanged neurologic exam during ED course with pain controlled using her gabapentin and Tylenol along with lidocaine patch.  Perioperative labs including CBC, CMP, coags ordered and independently interpreted by myself without any notable derangements.  Patient transferred to [facility] in stable condition.    ED Course as of 08/20/22 2139   Thu Aug 20, 2022   1800 MRI L Spine wo Contrast  Discussed MRI findings with on-call neurosurgeon who wants patient transferred to [facility] for planned operative spinal decompression this week and [WC]   1814 PVR of 0 [WC]      ED Course User Index  [WC] Vickie Epley, MD             External records review : Clinic Note                     Patient care and management discussed with : Consultant: Neurosurgery               Medications Given in the ED:   Medications   lidocaine (Lidoderm) 4 % per patch 1 patch (1 patch Transdermal Medication Applied 08/20/22 1440)   gabapentin (Neurontin)  capsule 300 mg (300 mg Oral Given 08/20/22 1438)   acetaminophen (Tylenol) tablet 1,000 mg (1,000 mg Oral Given 08/20/22 2001)   gabapentin (Neurontin) capsule 300 mg (300 mg Oral Given 08/20/22 2034)              CLINICAL IMPRESSION AND DISPOSITION (Link)     Clinical Impressions:   [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    Patient was given scripts for the following medications.  New Prescriptions    No medications on file          Disposition:  Transfer to Another [place] - No Critical Care                Vickie Epley, MD  08/20/22 2139

## 2022-08-20 NOTE — Telephone Encounter (Signed)
Pt called back regarding her symptoms and wants to discuss her "fall". Please call back Pt and advise.

## 2022-08-20 NOTE — ED Triage Notes (Signed)
Pt presents to ER with complaints of 8/ 10 lumbar back pain x3 weeks worsened by a mechanical ground level fall today. Pt endorses numbness and tingling to bilateral feet. Pt denies LOC, head strike, neuro symptoms. Pt unable to ambulate at this time. Hx rheumatoid arthritis.

## 2022-08-20 NOTE — Telephone Encounter (Signed)
Attempted to call pt but no answer. LVM requesting CB.  Margaret Cockerill, RN

## 2022-08-20 NOTE — Telephone Encounter (Signed)
Hx: per last OV note, "Autumn Camacho is a 56 year old female who presents with a chief complaint of :Back pain, Leg pain/weakness - right proximal leg weaker; left leg - pain."     Last OV: 07/20/22 w/ Dr. Reesa Chew    Next OV: 08/24/22 w/ Dr. Reesa Chew     Situation: called and spoke with patient who notes that she fell on the floor this morning (email from 10/23 also about having falls.) Denies hitting her head. Patient notes that since seeing Dr. Reesa Chew a month ago, she has progressively gotten "much much worse." Patient notes that she has "no feelings and no strength," to her legs. The left leg is more numb and the right is more weak and she is struggling to walk even with a walker. She notes that pain is under control due to gabapentin though.     She also notes having no indication of she is having a BM. This happens when she is sitting on the toilet when she unknowingly has a BM.     Pt also notes that she was scheduled for her RA injection today but she decided to not proceed with it. She had a discussion with her rheumatologist that she shouldn't do it if she is getting ready for surgery so she decided to hold on for now     Informed pt that she is scheduled with Dr. Reesa Chew on Monday and that is the only day he is in clinic and she can discuss surgical option with him at that appointment.     Plan: routing to APP pool for advice.  Dyami Umbach, RN

## 2022-08-21 ENCOUNTER — Inpatient Hospital Stay (HOSPITAL_COMMUNITY)
Payer: No Typology Code available for payment source | Admitting: Student in an Organized Health Care Education/Training Program

## 2022-08-21 ENCOUNTER — Encounter (HOSPITAL_COMMUNITY): Payer: Self-pay | Admitting: Neurological Surgery

## 2022-08-21 ENCOUNTER — Encounter (HOSPITAL_COMMUNITY): Payer: Self-pay | Admitting: Unknown Physician Specialty

## 2022-08-21 ENCOUNTER — Inpatient Hospital Stay (HOSPITAL_BASED_OUTPATIENT_CLINIC_OR_DEPARTMENT_OTHER): Payer: No Typology Code available for payment source

## 2022-08-21 ENCOUNTER — Inpatient Hospital Stay (HOSPITAL_COMMUNITY): Payer: No Typology Code available for payment source

## 2022-08-21 ENCOUNTER — Encounter (HOSPITAL_COMMUNITY)
Admission: AD | Disposition: A | Discharge status: Home / Self Care | Payer: Self-pay | Source: Other Acute Inpatient Hospital | Attending: Neurological Surgery

## 2022-08-21 DIAGNOSIS — M5033 Other cervical disc degeneration, cervicothoracic region: Secondary | ICD-10-CM

## 2022-08-21 DIAGNOSIS — M5023 Other cervical disc displacement, cervicothoracic region: Secondary | ICD-10-CM

## 2022-08-21 DIAGNOSIS — G8929 Other chronic pain: Secondary | ICD-10-CM

## 2022-08-21 DIAGNOSIS — G959 Disease of spinal cord, unspecified: Secondary | ICD-10-CM

## 2022-08-21 DIAGNOSIS — M542 Cervicalgia: Secondary | ICD-10-CM

## 2022-08-21 DIAGNOSIS — M5032 Other cervical disc degeneration, mid-cervical region, unspecified level: Secondary | ICD-10-CM

## 2022-08-21 DIAGNOSIS — M069 Rheumatoid arthritis, unspecified: Secondary | ICD-10-CM

## 2022-08-21 DIAGNOSIS — M5441 Lumbago with sciatica, right side: Secondary | ICD-10-CM

## 2022-08-21 DIAGNOSIS — Z48811 Encounter for surgical aftercare following surgery on the nervous system: Secondary | ICD-10-CM

## 2022-08-21 HISTORY — PX: CERVICAL FUSION: SHX112

## 2022-08-21 LAB — COVID-19 CORONAVIRUS QUALITATIVE PCR: COVID-19 Coronavirus Qual PCR Result: NOT DETECTED

## 2022-08-21 LAB — BASIC METABOLIC PANEL
Anion Gap: 9 (ref 4–12)
Calcium: 9 mg/dL (ref 8.9–10.2)
Carbon Dioxide, Total: 24 meq/L (ref 22–32)
Chloride: 107 meq/L (ref 98–108)
Creatinine: 0.52 mg/dL (ref 0.38–1.02)
Glucose: 76 mg/dL (ref 62–125)
Potassium: 3.8 meq/L (ref 3.6–5.2)
Sodium: 140 meq/L (ref 135–145)
Urea Nitrogen: 13 mg/dL (ref 8–21)
eGFR by CKD-EPI 2021: >60 mL/min/{1.73_m2} (ref >59)

## 2022-08-21 LAB — PROTHROMBIN & PTT
Partial Thromboplastin Time: 34 s (ref 22–35)
Prothrombin INR: 1 (ref 0.8–1.3)
Prothrombin Time Patient: 12.8 s (ref 10.7–15.6)

## 2022-08-21 LAB — OR BLOOD GAS PANEL, ART
Base Deficit Blood, ART: 1.8 meq/L (ref 0.0–2.0)
Bicarbonate: 24 meq/L (ref 22–26)
Calcium (Ionized): 1.15 mmol/L — ABNORMAL LOW (ref 1.18–1.38)
Carboxyhemoglobin, ART: 0.4 %
Glucose: 81 mg/dL (ref 62–125)
L Lactate (Direct), ART WB: 0.8 mmol/L (ref 0.4–1.0)
Methemoglobin, ART: 0.6 % (ref <3.0)
O2 Content: 16.7 VOL% (ref 15–23)
O2 Sat (Frac.), ART: 98 % (ref 94–99)
Potassium: 3.6 meq/L — ABNORMAL LOW (ref 3.7–5.2)
Sodium: 142 meq/L (ref 136–145)
Total Hemoglobin, ART: 11.9 g/dL (ref 11.5–15.5)
pCO2, ART: 42 mmHg (ref 33–48)
pH, ART: 7.36 (ref 7.35–7.45)
pO2: 181 mmHg — ABNORMAL HIGH (ref 70–95)

## 2022-08-21 LAB — CBC (HEMOGRAM)
Hematocrit: 41 % (ref 36.0–45.0)
Hemoglobin: 13.3 g/dL (ref 11.5–15.5)
MCH: 32.3 pg (ref 27.3–33.6)
MCHC: 32.4 g/dL (ref 32.2–36.5)
MCV: 100 fL — ABNORMAL HIGH (ref 81–98)
Platelet Count: 210 10*3/uL (ref 150–400)
RBC: 4.12 10*6/uL (ref 3.80–5.00)
RDW-CV: 13.3 % (ref 11.0–14.5)
WBC: 4.17 10*3/uL — ABNORMAL LOW (ref 4.3–10.0)

## 2022-08-21 SURGERY — DISCECTOMY, SPINE, CERVICAL, ANTERIOR APPROACH, WITH FUSION USING INSTRUMENTATION
Anesthesia: General | Site: Spine Cervical | Laterality: Left

## 2022-08-21 SURGERY — DISCECTOMY, SPINE, CERVICAL, ANTERIOR APPROACH, WITH FUSION USING INSTRUMENTATION
Anesthesia: General | Site: Spine Cervical | Wound class: Class I/ Clean

## 2022-08-21 MED ORDER — PROPOFOL 200 MG/20ML IV EMUL
INTRAVENOUS | Status: AC
Start: 2022-08-21 — End: 2022-08-21
  Filled 2022-08-21: qty 20

## 2022-08-21 MED ORDER — ACETAMINOPHEN 10 MG/ML IV SOLN
INTRAVENOUS | Status: DC | PRN
Start: 2022-08-21 — End: 2022-08-21
  Administered 2022-08-21: 1000 mg via INTRAVENOUS

## 2022-08-21 MED ORDER — DEXAMETHASONE SOD PHOSPHATE PF 10 MG/ML IJ SOLN
INTRAMUSCULAR | Status: DC | PRN
Start: 2022-08-21 — End: 2022-08-21
  Administered 2022-08-21: 10 mg via INTRAVENOUS

## 2022-08-21 MED ORDER — LIDOCAINE HCL 2 % IJ SOLN
INTRAMUSCULAR | Status: DC | PRN
Start: 2022-08-21 — End: 2022-08-21
  Administered 2022-08-21: 100 mg via INTRAVENOUS

## 2022-08-21 MED ORDER — GELATIN ABSORBABLE 100 EX MISC
CUTANEOUS | Status: DC | PRN
Start: 2022-08-21 — End: 2022-08-21
  Administered 2022-08-21: 1 via TOPICAL

## 2022-08-21 MED ORDER — MORPHINE SULFATE (PF) 2 MG/ML IV/IJ SOLN WRAPPER
1.0000 mg | Status: DC | PRN
Start: 2022-08-21 — End: 2022-08-21

## 2022-08-21 MED ORDER — CEFAZOLIN SODIUM 1 G IJ SOLR
INTRAMUSCULAR | Status: DC | PRN
Start: 2022-08-21 — End: 2022-08-21
  Administered 2022-08-21: 2 g via INTRAVENOUS

## 2022-08-21 MED ORDER — PROPOFOL 500 MG/50ML IV EMUL INFUSION
INTRAVENOUS | Status: AC
Start: 2022-08-21 — End: 2022-08-21
  Filled 2022-08-21: qty 100

## 2022-08-21 MED ORDER — MORPHINE SULFATE 5 MG/ML PCA CLINICIAN BOLUS FROM PUMP
2.0000 mg | INTRAVENOUS | Status: DC | PRN
Start: 2022-08-21 — End: 2022-08-22

## 2022-08-21 MED ORDER — NALOXONE HCL 0.4 MG/ML IJ SOLN
0.0400 mg | INTRAMUSCULAR | Status: DC | PRN
Start: 2022-08-21 — End: 2022-08-21

## 2022-08-21 MED ORDER — LACTATED RINGERS IV SOLN
100.0000 mL/h | INTRAVENOUS | Status: DC
Start: 2022-08-21 — End: 2022-08-22
  Administered 2022-08-21 – 2022-08-22 (×2): 100 mL/h via INTRAVENOUS

## 2022-08-21 MED ORDER — LACTATED RINGERS IV SOLN
INTRAVENOUS | Status: DC | PRN
Start: 2022-08-21 — End: 2022-08-21

## 2022-08-21 MED ORDER — REMIFENTANIL 1 MG IN STERILE WATER 20 ML SYRINGE
INJECTION | INTRAVENOUS | Status: DC | PRN
Start: 2022-08-21 — End: 2022-08-21
  Administered 2022-08-21: .12 ug/kg/min via INTRAVENOUS
  Administered 2022-08-21: 100 ug via INTRAVENOUS

## 2022-08-21 MED ORDER — MIDAZOLAM HCL (PF) 2 MG/2ML IJ SOLN
INTRAMUSCULAR | Status: AC
Start: 2022-08-21 — End: 2022-08-21
  Filled 2022-08-21: qty 2

## 2022-08-21 MED ORDER — ONDANSETRON HCL 4 MG/2ML IJ SOLN
INTRAMUSCULAR | Status: AC
Start: 2022-08-21 — End: 2022-08-21
  Filled 2022-08-21: qty 2

## 2022-08-21 MED ORDER — SUGAMMADEX SODIUM 200 MG/2ML IV SOLN
INTRAVENOUS | Status: AC
Start: 2022-08-21 — End: 2022-08-21
  Filled 2022-08-21: qty 2

## 2022-08-21 MED ORDER — PROPOFOL 10 MG/ML IV EMUL WRAPPER (OSM ONLY)
INTRAVENOUS | Status: DC | PRN
Start: 2022-08-21 — End: 2022-08-21
  Administered 2022-08-21: 125 ug/kg/min via INTRAVENOUS
  Administered 2022-08-21: 150 mg via INTRAVENOUS

## 2022-08-21 MED ORDER — LIDOCAINE HCL (PF) 2 % IJ SOLN
INTRAMUSCULAR | Status: AC
Start: 2022-08-21 — End: 2022-08-21
  Filled 2022-08-21: qty 5

## 2022-08-21 MED ORDER — REMIFENTANIL HCL 1 MG IV SOLR
INTRAVENOUS | Status: AC
Start: 2022-08-21 — End: 2022-08-21
  Filled 2022-08-21: qty 1

## 2022-08-21 MED ORDER — ACETAMINOPHEN 325 MG OR TABS
650.0000 mg | ORAL_TABLET | Freq: Four times a day (QID) | ORAL | Status: DC
Start: 2022-08-22 — End: 2022-08-22
  Administered 2022-08-21 – 2022-08-22 (×3): 650 mg via ORAL
  Filled 2022-08-21 (×3): qty 2

## 2022-08-21 MED ORDER — SUGAMMADEX SODIUM 200 MG/2ML IV SOLN
INTRAVENOUS | Status: DC | PRN
Start: 2022-08-21 — End: 2022-08-21
  Administered 2022-08-21: 100 mg via INTRAVENOUS

## 2022-08-21 MED ORDER — MIDAZOLAM HCL (PF) 1 MG/ML IJ SOLN WRAPPER (ANESTHESIA OSM ONLY)
INTRAMUSCULAR | Status: DC | PRN
Start: 2022-08-21 — End: 2022-08-21
  Administered 2022-08-21: 2 mg via INTRAVENOUS

## 2022-08-21 MED ORDER — GABAPENTIN 300 MG OR CAPS
300.0000 mg | ORAL_CAPSULE | Freq: Two times a day (BID) | ORAL | Status: DC
Start: 2022-08-21 — End: 2022-08-23
  Administered 2022-08-21 – 2022-08-23 (×4): 300 mg via ORAL
  Filled 2022-08-21 (×6): qty 1

## 2022-08-21 MED ORDER — BUPIVACAINE-EPINEPHRINE 0.25% -1:200000 IJ SOLN
INTRAMUSCULAR | Status: DC | PRN
Start: 2022-08-21 — End: 2022-08-21
  Administered 2022-08-21: 6 mL via INTRAMUSCULAR

## 2022-08-21 MED ORDER — ROCURONIUM BROMIDE 50 MG/5ML IV SOLN
INTRAVENOUS | Status: DC | PRN
Start: 2022-08-21 — End: 2022-08-21
  Administered 2022-08-21: 50 mg via INTRAVENOUS

## 2022-08-21 MED ORDER — LACTATED RINGERS BOLUS
500.0000 mL | Freq: Once | INTRAVENOUS | Status: DC | PRN
Start: 2022-08-21 — End: 2022-08-21

## 2022-08-21 MED ORDER — PHENYLEPHRINE HCL-NACL 25-0.9 MG/250ML-% IV SOLN
INTRAVENOUS | Status: DC | PRN
Start: 2022-08-21 — End: 2022-08-21
  Administered 2022-08-21: .2 ug/kg/min via INTRAVENOUS

## 2022-08-21 MED ORDER — ACETAMINOPHEN 10 MG/ML IV SOLN
INTRAVENOUS | Status: AC
Start: 2022-08-21 — End: 2022-08-21
  Filled 2022-08-21: qty 100

## 2022-08-21 MED ORDER — ONDANSETRON HCL 4 MG/2ML IJ SOLN
4.0000 mg | INTRAMUSCULAR | Status: DC | PRN
Start: 2022-08-21 — End: 2022-08-21

## 2022-08-21 MED ORDER — PSYLLIUM 58.12 % OR PACK
1.0000 | PACK | Freq: Every day | ORAL | Status: DC | PRN
Start: 2022-08-21 — End: 2022-08-23
  Filled 2022-08-21: qty 1

## 2022-08-21 MED ORDER — MORPHINE SULFATE (PF) 10 MG/ML IV/IJ SOLN WRAPPER
Status: DC | PRN
Start: 2022-08-21 — End: 2022-08-21
  Administered 2022-08-21: 1 mg via INTRAVENOUS
  Administered 2022-08-21: 4 mg via INTRAVENOUS
  Administered 2022-08-21: 5 mg via INTRAVENOUS

## 2022-08-21 MED ORDER — GLYCOPYRROLATE 0.2 MG/ML IJ SOLN
INTRAMUSCULAR | Status: AC
Start: 2022-08-21 — End: 2022-08-21
  Filled 2022-08-21: qty 1

## 2022-08-21 MED ORDER — NALOXONE HCL 0.4 MG/ML IJ SOLN
0.0800 mg | INTRAMUSCULAR | Status: DC | PRN
Start: 2022-08-21 — End: 2022-08-22

## 2022-08-21 MED ORDER — GADOTERIDOL 279.3 MG/ML IV SOLN
15.0000 mL | Freq: Once | INTRAVENOUS | Status: AC | PRN
Start: 2022-08-21 — End: 2022-08-21
  Administered 2022-08-21: 7.5 mmol via INTRAVENOUS

## 2022-08-21 MED ORDER — MORPHINE SULFATE (PF) 10 MG/ML IV/IJ SOLN WRAPPER
Status: AC
Start: 2022-08-21 — End: 2022-08-21
  Filled 2022-08-21: qty 1

## 2022-08-21 MED ORDER — MORPHINE SULFATE 5 MG/ML PCA IN NS 25 ML BAG
INTRAVENOUS | Status: DC
Start: 2022-08-21 — End: 2022-08-22
  Filled 2022-08-21: qty 125

## 2022-08-21 MED ORDER — PHENYLEPHRINE HCL-NACL 1-0.9 MG/10ML-% IV SOSY
PREFILLED_SYRINGE | INTRAVENOUS | Status: DC | PRN
Start: 2022-08-21 — End: 2022-08-21
  Administered 2022-08-21: 100 ug via INTRAVENOUS
  Administered 2022-08-21: 150 ug via INTRAVENOUS
  Administered 2022-08-21 (×2): 50 ug via INTRAVENOUS

## 2022-08-21 MED ORDER — REMIFENTANIL HCL 1 MG IV SOLR
INTRAVENOUS | Status: AC
Start: 2022-08-21 — End: 2022-08-21
  Filled 2022-08-21: qty 5

## 2022-08-21 MED ORDER — KETAMINE 250 MG IN NS 250 ML INFUSION (PKG PREMIX)
8.0000 mg/h | Status: DC
  Administered 2022-08-21: 8 mg/h via INTRAVENOUS
  Filled 2022-08-21: qty 250

## 2022-08-21 MED ORDER — OXIDIZED CELLULOSE - SURGICEL 2 X 14 PADS
MEDICATED_PAD | CUTANEOUS | Status: DC | PRN
Start: 2022-08-21 — End: 2022-08-21
  Administered 2022-08-21: 1 via TOPICAL

## 2022-08-21 MED ORDER — FENTANYL CITRATE (PF) 100 MCG/2ML IJ SOLN
25.0000 ug | INTRAMUSCULAR | Status: DC | PRN
Start: 2022-08-21 — End: 2022-08-21
  Administered 2022-08-21 (×2): 25 ug via INTRAVENOUS
  Filled 2022-08-21: qty 2

## 2022-08-21 MED ORDER — VALACYCLOVIR HCL 500 MG OR TABS
1000.0000 mg | ORAL_TABLET | Freq: Every day | ORAL | Status: DC
Start: 2022-08-21 — End: 2022-08-23
  Administered 2022-08-21 – 2022-08-23 (×3): 1000 mg via ORAL
  Filled 2022-08-21 (×3): qty 2

## 2022-08-21 MED ORDER — CEFAZOLIN SODIUM 1 G IJ SOLR
INTRAMUSCULAR | Status: AC
Start: 2022-08-21 — End: 2022-08-21
  Filled 2022-08-21: qty 2

## 2022-08-21 MED ORDER — KETAMINE 250 MG IN NS 250 ML INFUSION (PKG PREMIX)
8.0000 mg/h | Status: DC
  Administered 2022-08-21: 10 mg/h via INTRAVENOUS
  Filled 2022-08-21: qty 250

## 2022-08-21 MED ORDER — FENTANYL CITRATE (PF) 50 MCG/ML IJ SOLN WRAPPER (ANESTHESIA OSM ONLY)
INTRAMUSCULAR | Status: DC | PRN
Start: 2022-08-21 — End: 2022-08-21
  Administered 2022-08-21: 50 ug via INTRAVENOUS

## 2022-08-21 MED ORDER — FENTANYL CITRATE (PF) 100 MCG/2ML IJ SOLN
INTRAMUSCULAR | Status: AC
Start: 2022-08-21 — End: 2022-08-21
  Filled 2022-08-21: qty 2

## 2022-08-21 MED ORDER — ROCURONIUM BROMIDE 50 MG/5ML IV SOLN
INTRAVENOUS | Status: AC
Start: 2022-08-21 — End: 2022-08-21
  Filled 2022-08-21: qty 5

## 2022-08-21 MED ORDER — CEFAZOLIN SODIUM-DEXTROSE 2-4 GM/100ML-% IV SOLN
2.0000 g | Freq: Three times a day (TID) | INTRAVENOUS | Status: DC
Start: 2022-08-21 — End: 2022-08-22
  Administered 2022-08-21 – 2022-08-22 (×2): 2 g via INTRAVENOUS
  Filled 2022-08-21 (×3): qty 100

## 2022-08-21 SURGICAL SUPPLY — 41 items
ADHESIVE LIQUID MASTISOL 2/3ML (Other) ×1 IMPLANT
ANCHOR CATHETER FOLEY SECURE SYS NS (Other) ×1 IMPLANT
ANCHOR SPINAL COALITION MIS 12MM ×2 IMPLANT
BURR 3MM MATCH HEAD CARBIDE (Bur) ×1 IMPLANT
CATHETER IV BD ANGIOCATH 12GA 3IN (Catheter) ×1 IMPLANT
CATHETER TRAY COMPLETE CARE 16FR FOLEY (Catheter) ×1
CATHETER TRAY COMPLETE CARE TEMPERATURE SENSING 16FR FOLEY (Catheter) ×1 IMPLANT
DISTRACTOR PIN 12MM (Pin) ×2 IMPLANT
DRAIN BARD 10FR 1/8IN 1/8IN FULL FLUTE (Drain) IMPLANT
DRAPE CLOTH 54X90 ST (Drape) ×1 IMPLANT
DRAPE CLOTH 60X60 ST (Drape) ×1 IMPLANT
DRAPE EQUIP C-ARMOR EXPAND C ARM FLUOROSCOPE (Drape) ×1 IMPLANT
DRAPE EQUIP OPMI 154INX52IN MICROSCOPE PENTERO (Drape) ×1 IMPLANT
DRAPE EQUIP ORS 66INX44IN 2DISC (Drape) ×1 IMPLANT
DRESSING PRIMAPORE STERILE 6IN X 3-1/8IN (Dressing) ×1 IMPLANT
ELECTRODE PATIENT RETURN POLYHESVIE II REM W/ CORD (Other) ×1 IMPLANT
EVACUATOR LTWT LOW LEVEL SUCTION STERILE LF DISP (Drain) IMPLANT
FORCEP BIPOLAR OLSEN 8 1/4IN 1.5MM BAYONET INSULATE (Other) ×1 IMPLANT
GLOVE SURG 8 BIOGEL MICRO INDICATOR PF (Glove) ×1 IMPLANT
GLOVE SURG BIOGEL 7 1/2 ULTRATOUCH PF (Glove) ×1 IMPLANT
KIT DRAIN 400CC 1/8IN 3SPRING EVACUATOR (Drain) IMPLANT
NEEDLE HYPODERMIC SAFETY 21GA 1-1/2IN MAGELLAN (Needle) ×1 IMPLANT
PACK CUSTOM ORTHO SPINE (Pack) ×1 IMPLANT
PACK GOWN 4 PACK W/TOWELS (Gown) ×1 IMPLANT
PROTECT TRENDELENBURG ARM STD (Other) ×1 IMPLANT
SPACER SPINAL COALITION MIS TI 14MM 7MM 7D 12MM ×1 IMPLANT
SPACER SPINAL COALITION MIS TI 7D 12MM 14MM 7MM ×1 IMPLANT
SPACER SPINE COALITION ANCHOR 12MM ×3 IMPLANT
SPONGE SURG .25INX9/16IN (Sponge) ×1 IMPLANT
SUTURE BIOSYN 4-0 P-12 30IN UNDYED (Suture) ×1 IMPLANT
SUTURE MONOSOF 3-0 C-16 30IN BLACK (Suture) IMPLANT
SUTURE POLYSORB 2-0 V-20 30IN UNDYED (Suture) ×2 IMPLANT
SYRINGE 30ML LL (Syringe) ×1 IMPLANT
TAPE SURG 10YDX3IN CLOTH DURAPORE (Other) ×2 IMPLANT
TISSUE FREEZE DRY FILLER 1CC DBX ×1 IMPLANT
TISSUE FREEZE DRY FILLER DBX PUTTY 1CC ×1 IMPLANT
TOWEL SURG PACK (Towel) ×3 IMPLANT
TUBE SUCTION FRAZIER 5MM 2FT MALE CONNECT (Other) ×1 IMPLANT
WAX BONE 2.5GM HEMOSTATIC AGENT BEESWAX PETROLEUM (Other)
WAX BONE 2.5GM HEMOSTATIC AGENT BEESWAX PETROLEUM (DISCONTINUED USE ITEM 141832A) (Other)
WAX BONE 2.5GM HEMOSTATIC AGENT BEESWAX PETROLEUM CONVERTED USE ITEM 141832A (Other) IMPLANT

## 2022-08-21 NOTE — Procedure Nursing Note (Signed)
PACU Summary, Inpatient Transfer to Acute Care or ICU:    Illness severity: Stable: condition holding or improving   Most significant event:recovery progressing, vitals stable.    Patient summary/synopsis  Level of consciousness: awake, alert, and oriented  Nausea: None  Dressing(s) are: n/a, incision(s) open to air  Pain is present: Yes 3/10. Pain is tolerable or patient has good function: Yes  Oral fluids taken:  sips water  Precautions or restrictions: Fall    Action list/to do or watch items: APS for pain management     Transfer destination: 6SE     Patient recovered from anesthesia without complication. APS consulted for pain management and came to bedside to evaluate patient. Ketamine infusion continued as per order. Morphine PCA initiated and patient using appropriately. Patient states sensation is improving in lower extremities. Report called to [employer] prior to transfer. Patient transferred back to [facility] via stretcher. 1 green [place] bag of belongings sent with patient. Patient's husband notified at time of transfer.

## 2022-08-21 NOTE — Anesthesia Procedure Notes (Signed)
PERIPHERAL IV    Staff:  Performing provider: Milus Height, CRNA  Authorizing provider: Jerlyn Ly, MD    Procedure Indication/Location:  Patient location: OR/Procedural area  Indication for IV: for care provided by anesthesia    Preparation:  Site prep: alcohol 70%    Placement:  Laterality: right  Location: foot  Size: 20 G  Total attempts for all operators: 4    Date and Time Placed:   08/21/2022 1:54 PM

## 2022-08-21 NOTE — Procedure Nursing Note (Signed)
Patient's spouse, [place], given surgical update via phone.

## 2022-08-21 NOTE — Nursing Note (Signed)
Patient Summary  56 year old female with history of known spinal stenosis presenting to the emergency department with worsening back pain, new numbness to left leg, chronic, unchanged numbness to right leg along with progressive incontinence of stool and worsening balance issues/gait instability.     Aox4 Called report to Pre-Op nurse at 1030. CHG wipe down completed. Pt transported via stretcher. Pt has not returned back to the unit.

## 2022-08-21 NOTE — Anesthesia Postprocedure Evaluation (Signed)
Patient: Autumn Camacho    Procedure Summary:   Date: 08/21/2022  Room/Location: [place] MAIN OR 38 / [place] MAIN OR    Anesthesia Start: 12:56 PM  Anesthesia Stop:  4:29 PM    Procedure(s):  Cervical 7-Thoracic 1 DISCECTOMY, SPINE, CERVICAL, ANTERIOR APPROACH, WITH FUSION USING INSTRUMENTATION  Post-op Diagnosis     * Degenerative disc disease, lumbar [M51.36]    Responsible Provider:   Jerlyn Ly, MD  ASA Status:      Vitals Value Taken Time   BP 118/77 08/21/22 1815   Temp 36.2 C 08/21/22 1800   Pulse 80 08/21/22 1820   SpO2 95 % 08/21/22 1820   Vitals shown include unvalidated device data.    Place of evaluation: PACU    Patient participation: patient participated    Level of consciousness: fully conscious    Patient pain control satisfaction: patient is satisfied with level of pain control    Airway patency: patent    Cardiovascular status during assessment: stable    Respiratory status during assessment: breathing comfortably    Anesthetic complications: no    Intravascular volume status assessment: euvolemic    Nausea / vomiting: patient is not experiencing nausea      Planned post-operative disposition at time of assessment: ward care

## 2022-08-21 NOTE — Procedure Nursing Note (Signed)
Pre/post op plan discussed with patient.

## 2022-08-21 NOTE — Progress Notes (Signed)
POSTOP CHECK    [G] falls, LUE weak, LBP; C7-T1, L2-3, L4-5 disc->C7-T1 ACDF (90D) [RA]    Exam - postop check:  Alert and conversant  EOMI  FS, TM      R L  UPPER  D  5 5  B (C5-6) 4 5  T (C7-8) 4 4+  WE (C6) 4 4  FDP (C8) 4+ 4+  ADM (T1) 4 4  ---  LOWER  IP (L2-3) 5 5  Q (L4)  5 5  TA (L4-5) 5 5  G (S1)  4 4  EHL (L5) 4 4     No hoffmans    Plan:  PACU for recovery  NSGY floor, q4h neuro checks  Upright C spine XR when able  Ancef x 24 hours  No drain  SCDs only x 24 hours  PT/OT  D/c foley when mobilizing  May need further surgery to address lumbar spine while in house

## 2022-08-21 NOTE — Nursing Note (Signed)
Patient Summary  56 year old female with history of known spinal stenosis presenting to the emergency department with worsening back pain, new numbness to left leg, chronic, unchanged numbness to right leg along with progressive incontinence of stool and worsening balance issues/gait instability  Edited by: Leontine Locket, RN at 08/21/2022 [phone]    Illness Severity  Stable  Edited by: Leontine Locket, RN at 08/21/2022 [phone]

## 2022-08-21 NOTE — Progress Notes (Signed)
[facility] NEUROLOGICAL SURGERY  Progress Note Date of Service: 08/21/22   Date of Admission: 08/20/2022  Hospital Day: 1  Post Op Date: * No surgery date entered *      IDENTIFICATION  56 year old female // [G] LBP; C7-T1, L3-4, L5-S1 disc (90D) [RA]  Edited byHardie Shackleton, MD at 08/21/2022 0542       INTERVAL EVENTS   NAEON  NPO after MN for OR today     OBJECTIVE   EXAM  Exam deferred as patient not available/ getting imaging       BP 111/78 (Patient Position: Lying)   Pulse 88   Temp 36.6 C (Temporal)   Resp 16   Ht '5\' 1"'$  (1.549 m)   Wt 78 kg (172 lb)   SpO2 92%   BMI 32.50 kg/m     Intake/Output Summary (Last 24 hours) at 08/21/2022 0815  Last data filed at 08/21/2022 0600  Gross per 24 hour   Intake --   Output 25 ml   Net -25 ml        LABS  Lab Results   Component Value Date    SODIUM 141 08/20/2022    SODIUM 143 08/14/2022    WBC 5.43 08/20/2022    WBC 4.92 08/14/2022    HEMATOCRIT 39 08/20/2022    HEMATOCRIT 44 08/14/2022    PLATELET 194 08/20/2022    PLATELET 243 08/14/2022    INR 1.0 08/20/2022    INR 0.9 08/08/2021        ASSESSMENT & PLAN         Brief NSGY plan:  - NSGY floor NC q4 hour  - NPO for OR today  - Consent  - Needs MRI C spine  - XR L spine complete

## 2022-08-21 NOTE — H&P (Signed)
History and Physical     [name]") - DOB: 04/06/1966 (56 year old female)  Gender Identity: Female  Preferred Pronouns: she/her/hers  PCP: Terressa Koyanagi, MD   Code Status: Full Code       CHIEF CONCERN / IDENTIFICATION:     Autumn Camacho is a 56 year old female with RA who presented to [facility] after mGLF found to have degenerative changes in C7-T1 and multilevel lumbar degenerative changes most significant L4-5.   SUBJECTIVE   HISTORY OF PRESENT ILLNESS:   Autumn Camacho is a 56 year old female with RA who presented [facility] after mGLF found to have degenerative changes in C7-T1 and multilevel lumbar degenerative changes most significant L4-5. She is an established patient of Dr. Reesa Chew and was supposed to see him for follow-up on Monday but came into the ED due to the fall that happened yesterday. She reports that she has been having progressively worsening LBP over the past couple weeks which involve pain/numbness/tingling from her waist down her legs bilaterally to her feet. She also has been having urinary and bowel incontinence.       OUTPATIENT MEDICATIONS:   Current Outpatient Medications   Medication Instructions    acetaminophen (TYLENOL) 1,000 mg, Oral, Every 6 hours PRN    albuterol HFA 108 (90 Base) MCG/ACT inhaler No dose, route, or frequency recorded.    certolizumab (Cimzia Starter Kit) 6 X 200 MG/ML prefilled syringe kit Inject 2 syringes (400 mg) under the skin initially, and at weeks 2 and 4 followed by 200 mg every other week    Cimzia 200 mg, Subcutaneous, Every 2 weeks    diazePAM (VALIUM) 5 mg, Oral, 2 times daily PRN    fluticasone propionate HFA 220 MCG/ACT inhaler 1 puff    leflunomide 20 MG tablet TAKE 1 TABLET DAILY    meloxicam 7.5 MG tablet TAKE 1 TO 2 TABLETS DAILY    oxyCODONE 5 mg, Oral, Every 6 hours PRN    valACYclovir 1 g tablet No dose, route, or frequency recorded.       ALLERGIES:   Fentanyl, Hydromorphone, Remicade [infliximab], Codeine, and  Nortriptyline      OBJECTIVE        T: 36 C (08/20/22 2247)  BP: 117/75 (08/20/22 2247)  HR: 91 (08/20/22 2247)  RR: 16 (08/20/22 2247)  SpO2: 92 % (08/20/22 2247) Room air   Vitals (Most recent in last 24 hrs)   T: 36.6 C (08/21/22 0532)  BP: 111/78 (08/21/22 0532)  HR: 88 (08/21/22 0532)  RR: 16 (08/21/22 0532)  SpO2: 92 % (08/21/22 0532) Room air  T range: Temp  Min: 36 C  Max: 36.8 C  Wt 172 lb (78.019 kg)     Ht _0  (1.549 m)     Body mass index is 32.5 kg/m.     PHYSICAL EXAM:   A&Ox3  EOMI  FS, TM  No drift    R L  UPPER (limited by RA)  D  4 4  B (C5-6) 4 4  T (C7-8) 4 4  WE (C6) 4 4  FDP (C8) 4 4  ADM (T1) 4 4  ---  LOWER  IP (L2-3) 5 5  Q (L4)  5 5  TA (L4-5) 5 5  G (S1)  5 5  EHL (L5) 5 5     Sensation grossly intact throughout  Rectal tone present  PVR 25cc    Labs (last 24  hours):   Chemistries  CBC  LFT  Gases, other   141 110 12 76   12.9   AST: 17 ALT: 18  -/-/-/-  -/-/-/-   3.6 21 0.49   5.43 >< 194  AP: 61 T bili: 0.3  Lact (a): - Lact (v): -   eGFR: >60 Ca: 8.9   39   Prot: 6.3 Alb: 3.6  Trop I: - D-dimer: -   Mg: - PO4: -  ANC: 2.68     BNP: - Anti-Xa: -     ALC: 2.12    INR: 1.0        IMAGING:   MRI T/L spine 08/20/22  Multilevel degenerative changes in C7-T1, L2-3, L3-4, L4-5.         ASSESSMENT/PLAN      Autumn Camacho is a 56 year old female with RA who [facility] after mGLF found to have degenerative changes in C7-T1 and multilevel lumbar degenerative changes most significant L4-5. Symptomatic for LBP with radiculopathy and urinary/bowel incontinence. Neurologically intact with upper extremities reduced strength and lower extremities full strength.    Recommendations:  - MRI C spine STAT  - XR L spine (AP/lat/flex/ex) STAT  - Plan for OR tomorrow

## 2022-08-21 NOTE — Anesthesia Preprocedure Evaluation (Signed)
Patient: Autumn Camacho  Procedure Information       Anesthesia Start Date/Time: 08/21/22 1256    Procedure: Cervical 7-Thoracic 1 DISCECTOMY, SPINE, CERVICAL, ANTERIOR APPROACH, WITH FUSION USING INSTRUMENTATION (Spine Cervical)    Location: [place] MAIN OR 18 / [place] MAIN OR    Surgeons: Caleb Popp, MD            HPI: Degenerative C-Spine Disease    Relevant Problems   No relevant active problems     Relevant surgical history:       Medications:     Outpatient:   Current Outpatient Medications   Medication Instructions    acetaminophen (TYLENOL) 1,000 mg, Oral, Every 6 hours PRN    albuterol HFA 108 (90 Base) MCG/ACT inhaler No dose, route, or frequency recorded.    certolizumab (Cimzia Starter Kit) 6 X 200 MG/ML prefilled syringe kit Inject 2 syringes (400 mg) under the skin initially, and at weeks 2 and 4 followed by 200 mg every other week    Cimzia 200 mg, Subcutaneous, Every 2 weeks    diazePAM (VALIUM) 5 mg, Oral, 2 times daily PRN    fluticasone propionate HFA 220 MCG/ACT inhaler 1 puff    leflunomide 20 MG tablet TAKE 1 TABLET DAILY    meloxicam 7.5 MG tablet TAKE 1 TO 2 TABLETS DAILY    oxyCODONE 5 mg, Oral, Every 6 hours PRN    valACYclovir 1 g tablet No dose, route, or frequency recorded.        Inpatient:   Scheduled     [START ON 08/22/2022] acetaminophen, 650 mg, q6h [facility]    ceFAZolin, 2 g, q8h [facility]    gabapentin, 300 mg, BID    gabapentin, 600 mg, q HS    polyethylene glycol 3350, 17 g, Daily    senna, 17.2 mg, BID    valACYclovir, 1,000 mg, Daily      Continuous    ketamine, 8 mg/hr, Continuous, Last Rate: 8 mg/hr (08/21/22 1737)    lactated ringers, 100 mL/hr, Continuous, Last Rate: 100 mL/hr (08/21/22 2008)    morphine 5 mg/mL PCA, , PCA **AND** morphine sulfate 5 mg/mL PCA CLINICIAN, 2 mg, q5 min PRN      PRN    bisacodyl, 10 mg, Daily PRN    bisacodyl, 10 mg, Daily PRN    melatonin, 3 mg, q HS PRN    metoclopramide, 5 mg, q6h PRN    morphine 5 mg/mL PCA, , PCA **AND** morphine  sulfate 5 mg/mL PCA CLINICIAN, 2 mg, q5 min PRN    naloxone, 0.08 mg, q2 min PRN    ondansetron, 4 mg, q8h PRN    ondansetron, 4 mg, q8h PRN    psyllium, 1 packet, Daily PRN          Review of patient's allergies indicates:  Allergies   Allergen Reactions    Fentanyl Other and Unknown     Per patient, she has "forgotten to breathe"  Per patient, she has "forgotten to breathe"  Per patient, she has "forgotten to breathe"    Hydromorphone [id number]    Remicade [Infliximab] Anaphylaxis    Codeine Other and Unknown     "Feels like her skin is crawling"  Extreme feeling of something crawling  "Feels like her skin is crawling"  "Feels like her skin is crawling"    Nortriptyline Other and Unknown     Heart palpitations  Heart palpitations  Heart palpitations       Social  History:     ROS/MED HX        Physical Exam  Airway  Mallampati:  II  Upper Lip Bite Test: I  TM distance:  >6 cm  Neck ROM:  Limited(Extension)  Mouth Opening:  Normal    Dental  normal      Cardiovascular  normal    Rhythm:  Regular  Rate:  Normal    Pulmonary  normal    Breath sounds clear to auscultation           Labs: (last year)    BMP  CBC/Coags   Na 142 08/21/2022  Hb 13.3 08/21/2022   K 3.6 (L) 08/21/2022  HCT 41 08/21/2022   Cl 107 08/21/2022  WBC 4.17 (L) 08/21/2022   HCO3 24 08/21/2022  PLT 210 08/21/2022   BUN 13 08/21/2022  INR 1.0 08/21/2022   Cr 0.52 08/21/2022  PT 12.8 08/21/2022   Glu 81 08/21/2022  PTT 34 08/21/2022       Misc   eGFR >60 08/21/2022  MCV 100 (H) 08/21/2022   A1C - -  BNP - -       LFTs   AST 17 08/20/2022  Albumin 3.6 08/20/2022   ALT 18 08/20/2022  Protein 6.3 08/20/2022   Alk Phos 61 08/20/2022  T Bili 0.3 08/20/2022      ABG    08/21/2022   pH PaCO2 PaO2 HCO3 Lactate   7.36 42 181 (H) 24 0.8         Relevant procedures / diagnostic studies:     Risk Calculators / Scores:   Obstructive Sleep Apnea (OSA): Intermediate Risk  Total Score: 3              Patient snores loudly     Patient is often tired     Patient is over 4 years old         Criteria that do not apply:    Patient has been observed to stop breathing during sleep    Patient has high blood pressure or is being treated for high blood pressure    Patient BMI is greater than 35 kg/m2    Patient's neck circumference is greater than 17 inches (female) or 16 inches (female)    Patient is female          PONV: High Risk  Total Score: 3            Female patient     Non-smoker     Intended opioid administration        Criteria that do not apply:    History of PONV    History of motion sickness                PAT CLINIC DISCUSSION    ANESTHESIA PLAN   Informed Consent:     Anesthesia Plan discussed with:        Patient    ASA Score:     ASA: 3  Planned Anesthetic Type:      general

## 2022-08-21 NOTE — Anesthesia Procedure Notes (Signed)
Airway Placement    Staff:  Performing Provider: Milus Height, CRNA  Authorizing Provider: Jerlyn Ly, MD    Airway management:   Patient location: OR/Procedural area  Final airway type: Endotracheal airway  Intubation reason: General anesthesia/planned procedure    Induction:  Positioning: supine  Preoxygenation: Yes  Apneic Oxygenation: Standard nasal cannula  Mask Ventilation: Grade 1 - Ventilated by mask     Intubation:    Number of Attempts: 1    Final Attempt   Airway Type: ETT  Primary Laryngoscopy: GlideScope  Glidescope Blade Geometry: LoPro (hyperangulated)  Blade Size: 3  Laryngoscopic View: Grade I  ETT Type: standard, cuffed  ETT Route: oral  Size: 6.5  ETT secured with adhesive tape  Depth at: teeth  (20cm)    Assessment:  Procedure Abandoned: no    Date / Time Airway Secured / Re-Secured:  08/21/2022 1:17 PM    Final procedure comments:  Elective 2/2 cervical stenosis, pt had good neck ROM preop

## 2022-08-21 NOTE — Brief Op Note (Signed)
Immediate Brief Operative Note    Autumn Camacho - DOB: 02-06-1966 (56 year old female) MRN: [id number]  Procedure Date: 08/21/2022     [place] MAIN OR           PROCEDURE DETAILS    Procedure Team:  Primary: Caleb Popp, MD  Resident - Assisting: Lorin Picket, MD; Ezzard Standing, MD     Procedure(s):   Cervical 7-Thoracic 1 DISCECTOMY, SPINE, CERVICAL, ANTERIOR APPROACH, WITH FUSION USING INSTRUMENTATION   Pre Procedure Diagnosis:  Degenerative disc disease, lumbar [M51.36]     Post Procedure Diagnosis:        * Degenerative disc disease, lumbar [M51.36]       PROCEDURE SUMMARY    Anesthesia: Anesthesia type not filed in the log.  Estimated Blood Loss:15 mL     Wound Closure: Primary Closure (Primary Intention)      SPECIMENS:     ID Type Source Tests Collected by Time   1 : C7-T1 disc Tissue Intervertebral disc PATHOLOGY, SURGICAL Caleb Popp, MD 08/21/2022 1457         FINDINGS   C7-T1 herniated disc s/p ACDF    POST OP PLAN   PACU for recovery  NSGY floor, q4h neuro checks  Upright C spine XR when able  Ancef x 24 hours  No drain  SCDs only x 24 hours  PT/OT  D/c foley when mobilizing  May need further surgery to address lumbar spine while in house

## 2022-08-21 NOTE — Consults (Signed)
Consult Note     Autumn Grana Dike [name]") - DOB: [birth year] (56 year old female)  Gender Identity: Female  Preferred Pronouns: she/her/hers  Admit Date: 08/20/2022  Code Status: Full Code         Inpatient Consult to Pain Management  Consult performed by: Clenton Pare, PA-C  Consult ordered by: Caleb Popp, MD  Reason for consult: complex post operative pain management and continuation of ketamine        CHIEF CONCERN / IDENTIFICATION:     Autumn Camacho is a 56 year old female (with opioid sensitivity to HM) with PMH RA/osteoarthritis, who presented to [facility] ED after GLF and found to have C7-T1 herniated disc s/p ACDF on 08/21/22.     Acute Pain Service consulted for assistance with complex post operative pain control/ hx of RA on meloxicam      SUBJECTIVE   HISTORY OF PRESENT ILLNESS:     From Neurosurgery H&P note dated 08/20/22 by Dr. Hardie Shackleton     "Autumn Camacho is a 56 year old female with RA who presented [facility] after mGLF found to have degenerative changes in C7-T1 and multilevel lumbar degenerative changes most significant L4-5. She is an established patient of Dr. Reesa Chew and was supposed to see him for follow-up on Monday but came into the ED due to the fall that happened yesterday. She reports that she has been having progressively worsening LBP over the past couple weeks which involve pain/numbness/tingling from her waist down her legs bilaterally to her feet. She also has been having urinary and bowel incontinence."      Current Pain Complaints:  Low back pain pt states is also chronic x several weeks and R hip pain/ post surgical incisional pain   neck pain    Patient Reported Outcomes:   Worst pain severity in the past 24 hours (0 none - 10 worst): 8  Least pain severity in the past 24 hours (0 none - 10 worst): 3  Change in pain: getting better  Comfort: comfortably manageable  Pain control: well controlled  Pain treatment satisfaction: satisfied    Treatment  Provided at Time of Assessment:  Intra-op: dex 75m, ketamine 28.558m morphine IV 1010m1gram IV x 1, fent 69m75m PACU: ketamine 8mg/16m     Patient Reported Treatment Related Side Effects:  Nausea: absent  Drowsiness: absent  Itching: absent  Symptomatic low blood pressure: absent  Muscle weakness: absent  Cognitive Dysfunction: absent      ALLERGIES:   Fentanyl, Hydromorphone, Remicade [infliximab], Codeine, and Nortriptyline          Anticoagulation: Denies anticoagulant use.  Fall history: GLF prior to admit     HISTORY   Problem List   Diagnosis    Rheumatoid [facility] involving multiple sites ([facility]    Rheumatoid arthritis (HCC) [facility]heumatoid arthritis involving right hand (HCC)    Arthritis of carpometacarpal (CMC) joints of both thumbs    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    Osteoarthritis of right hip    Osteoarthritis of right knee    Knee dislocation, left, initial encounter    Failed total left knee replacement (HCC)    Chronic instability of left knee    Failed total knee replacement, subsequent encounter    Status post revision of total replacement of left knee    Chronic right-sided low back pain with sciatica  Degenerative disc disease, lumbar    Cervical stenosis of spinal canal       Past Surgical History:   Procedure Laterality Date    foot Bilateral     Arch surgery    KNEE ARTHROPLASTY      LEFT 2018 REPAIR 2019    KNEE ARTHROPLASTY Left 05/01/2020    PR ADENOIDECTOMY PRIMARY <AGE 43      AGE 20  T/A     PR ANES; TOTAL KNEE REPLACEMENT Left 04/01/2020    PR TONSILLECTOMY ONE-HALF <AGE 59      PR UNLISTED PROCEDURE FEMUR/KNEE Bilateral     TKA    PR UNLISTED PROCEDURE FEMUR/KNEE      Left revision TKA     PR UNLISTED PROCEDURE FOOT/TOES Bilateral 2009 R,  2010 L    Reconstructive surgery    PR UNLISTED PROCEDURE PELVIS/HIP JOINT      DOS 07/30/20 R THA    TKA Left 2019    revision from prior TKA       Social History     Tobacco Use     Smoking status: Former     Packs/day: 1.00     Years: 20.00     Pack years: 20.00     Types: Cigarettes     Quit date: 2015     Years since quitting: 8.8    Smokeless tobacco: Never    Tobacco comments:     hx : Off and on smoker but had vape , quit 2018.   Substance and Sexual Activity    Alcohol use: Not Currently     Comment: remote history of alcohol dependence    Drug use: Never   Social History Narrative    She moved to [place] from [place] in 04/2018.        Family History   Problem Relation Age of Onset    Cancer Father     Heart Attack Father     Rheumatoid Arthritis Other         Pt denies current or past issue with substance use including alcohol or opioids. Former smoker, quit 2015 per chart.     [place] PMP Medication Dispense History (from 08/26/2021 to 08/21/2022)     Dispensed Days Supply Quantity   DIAZEPAM 5 MG TABLET 07/01/2022 4 8 unspecified          OBJECTIVE        T: 36 C (08/20/22 2247)  BP: 117/75 (08/20/22 2247)  HR: 91 (08/20/22 2247)  RR: 16 (08/20/22 2247)  SpO2: 92 % (08/20/22 2247) Room air   Vitals (Most recent in last 24 hrs)   T: (!) 35.9 C (08/21/22 1625)  BP: 127/81 (08/21/22 1645)  HR: 78 (08/21/22 1645)  RR: 16 (08/21/22 1645)  SpO2: 97 % (08/21/22 1645) Room air  T range: Temp  Min: 35.9 C  Max: 36.6 C  Wt 172 lb (78.019 kg)     Ht _0  (1.549 m)     Body mass index is 32.5 kg/m.       Physical Exam    CONSTITUTIONAL: patient lying supine in bed, in no apparent distress    HEAD: atraumatic, normocephalic  HEARING: Hearing intact without use of hearing aids  ENT: Sclera anicteric, pupils medium   SPEECH: Clear  CARDIAC: Well perfused, normotensive  RESPIRATORY: No audible rhonchi, or wheezes. No accessory muscle usage. Able to take deep breath.   On RA  MUSCULOSKELETAL: No gross swelling or obvious  deformity. Movement of all four extremities during exam.   SKIN:  Warm, dry, no obvious rashes or lesions   NEURO: A&O x 3. Cranial nerves grossly intact.   PSYCH: Mood and  affect appropriate to situation, calm and cooperative       Labs (last 24 hours):   Chemistries  CBC  LFT  Gases, other   142 107 13 81   13.3   AST: 17 ALT: 18  7.36/-/-/24  -/-/-/-   3.6 24 0.52   4.17 >< 210  AP: 61 T bili: 0.3  Lact (a): - Lact (v): -   eGFR: >60 Ca: 9.0   41   Prot: 6.3 Alb: 3.6  Trop I: - D-dimer: -   Mg: - PO4: -  ANC: 2.68     BNP: - Anti-Xa: -     ALC: 2.12    INR: 1.0           Reviewed Results? Independently visualized & interpreted? Key Findings     Lab _0  _1     Radiology _2  _3     EKG/Tele/Echo  _4  _5     Other?  _6  _7             ASSESSMENT/PLAN      Autumn Camacho is a 56 year old female (with opioid sensitivity to HM) with PMH RA/osteoarthritis, who presented to [facility] ED after GLF and found to have C7-T1 herniated disc s/p ACDF on 08/21/22.     Acute Pain Service consulted for assistance with complex post operative pain control/ hx of RA on meloxicam     Pain Care Impression:     Assessment: Patient met and examined in the PACU where she was reporting "okay" pain control with ketamine gtt. She states when she has had HM in the past she has become very sick with N/V and prefers not to receive fentanyl as she stopped breathing when given it in the ED after a knee dislocation. She is opioid naive and only uses APAP and meloxicam at home for RA/chronic pain. After other procedures, patient did well with morphine and so we have started a MS PCA 1/6/0. Patient denies nausea, pruritus, confusion, weakness.     Today's Plan:     Cont  Ketamine gtt 15m/hr   MS PCA 1/6/0 CB 2    Sched  APAP 6593mPO q6hr   Gabapentin 300/300/600 (home med)    PRN  Trazodone 2562mHS PRN (home med/ NOT started yet due to other sedating meds)        Monitor for safety and side effects. RNs please page APS overnight with any questions or concerns or for moderate to severe pain not well controlled.      Discussed Treatment: patient's role in managing pain, potential limitations of plan and potential side  effects of plan  Discussed with: patient, primary service and RN

## 2022-08-21 NOTE — Anesthesia Procedure Notes (Signed)
Arterial Line Placement    Staff:  Performing provider: Milus Height, CRNA  Authorizing provider: Jerlyn Ly, MD    Procedure Indication/Location:  Patient location: OR/Procedural area  Indication: continuous blood pressure monitoring, blood sampling needed and unable to use non-invasive cuff    Preparation:  Patient position: supine  Patient level of consciousness: under general anesthesia  Skin prep: chlorhexidine  Sterile barriers: sterile gloves, mask, sterile drape and probe cover    Procedure:  Laterality: right  Site: radial  Catheter size: 20 G  Catheter type: arrow  Guidewire used: yes  Ultrasound-guided: localize potential vessel for access, guidance to enter vessel and demonstrate vessel latency  Ultrasound image captured: no  Line secured: tape    Events:  Total attempts: 1  Complications: patient tolerated procedure well with no complications.    Date and Time Placed:  08/21/2022 1:27 PM

## 2022-08-22 ENCOUNTER — Encounter (HOSPITAL_COMMUNITY): Payer: Self-pay | Admitting: Pain Medicine

## 2022-08-22 DIAGNOSIS — M542 Cervicalgia: Secondary | ICD-10-CM

## 2022-08-22 DIAGNOSIS — G8918 Other acute postprocedural pain: Secondary | ICD-10-CM

## 2022-08-22 DIAGNOSIS — M545 Low back pain, unspecified: Secondary | ICD-10-CM

## 2022-08-22 LAB — BASIC METABOLIC PANEL
Anion Gap: 8 (ref 4–12)
Calcium: 8.8 mg/dL — ABNORMAL LOW (ref 8.9–10.2)
Carbon Dioxide, Total: 23 meq/L (ref 22–32)
Chloride: 109 meq/L — ABNORMAL HIGH (ref 98–108)
Creatinine: 0.53 mg/dL (ref 0.38–1.02)
Glucose: 105 mg/dL (ref 62–125)
Potassium: 4 meq/L (ref 3.6–5.2)
Sodium: 140 meq/L (ref 135–145)
Urea Nitrogen: 9 mg/dL (ref 8–21)
eGFR by CKD-EPI 2021: >60 mL/min/{1.73_m2} (ref >59)

## 2022-08-22 LAB — CBC (HEMOGRAM)
Hematocrit: 36 % (ref 36.0–45.0)
Hemoglobin: 11.8 g/dL (ref 11.5–15.5)
MCH: 32.7 pg (ref 27.3–33.6)
MCHC: 33.1 g/dL (ref 32.2–36.5)
MCV: 99 fL — ABNORMAL HIGH (ref 81–98)
Platelet Count: 197 10*3/uL (ref 150–400)
RBC: 3.61 10*6/uL — ABNORMAL LOW (ref 3.80–5.00)
RDW-CV: 13 % (ref 11.0–14.5)
WBC: 10.01 10*3/uL — ABNORMAL HIGH (ref 4.3–10.0)

## 2022-08-22 LAB — COVID-19 CORONAVIRUS QUALITATIVE PCR: COVID-19 Coronavirus Qual PCR Result: NOT DETECTED

## 2022-08-22 MED ORDER — PSYLLIUM 58.12 % OR PACK
1.0000 | PACK | Freq: Every day | ORAL | Status: DC | PRN
Start: 2022-08-22 — End: 2022-08-23
  Administered 2022-08-22: 1 via ORAL

## 2022-08-22 MED ORDER — OXYCODONE HCL 5 MG OR TABS
5.0000 mg | ORAL_TABLET | ORAL | Status: DC | PRN
Start: 2022-08-22 — End: 2022-08-22
  Administered 2022-08-22: 10 mg via ORAL
  Filled 2022-08-22: qty 2

## 2022-08-22 MED ORDER — OXYCODONE HCL 5 MG OR TABS
5.0000 mg | ORAL_TABLET | ORAL | Status: DC | PRN
Start: 2022-08-22 — End: 2022-08-23
  Administered 2022-08-22 (×2): 7.5 mg via ORAL
  Administered 2022-08-22 – 2022-08-23 (×3): 5 mg via ORAL
  Filled 2022-08-22 (×2): qty 1
  Filled 2022-08-22: qty 2
  Filled 2022-08-22: qty 1
  Filled 2022-08-22: qty 2
  Filled 2022-08-22: qty 1

## 2022-08-22 MED ORDER — HEPARIN SODIUM (PORCINE) 5000 UNIT/ML IJ SOLN
5000.0000 [IU] | Freq: Three times a day (TID) | INTRAMUSCULAR | Status: DC
Start: 2022-08-22 — End: 2022-08-23
  Administered 2022-08-22 – 2022-08-23 (×3): 5000 [IU] via SUBCUTANEOUS
  Filled 2022-08-22 (×3): qty 1

## 2022-08-22 MED ORDER — ACETAMINOPHEN 500 MG OR TABS
1000.0000 mg | ORAL_TABLET | Freq: Four times a day (QID) | ORAL | Status: DC
Start: 2022-08-22 — End: 2022-08-23
  Administered 2022-08-22 – 2022-08-23 (×4): 1000 mg via ORAL
  Filled 2022-08-22 (×4): qty 2

## 2022-08-22 MED ORDER — PILL SPLITTER MISC
Status: AC
Start: 2022-08-22 — End: 2022-08-23
  Filled 2022-08-22: qty 1

## 2022-08-22 NOTE — Progress Notes (Signed)
Occupational Therapy Evaluation and Treatment Note    Patient Name: Autumn Camacho  MRN: [id number]  Today's Date: 08/22/2022  Total Treatment time: [address] [place] [address]  Type of Home: Multilevel home (main level and a basement apartment that her daughter lives in)  Entry Stairs: 13 steps  Indoor Stairs: 0  Lives With: Community education officer Available at Home : 24 hour assistance  Bathroom Shower/Tub: Administrator, Civil Service: [place]: [place]: Tub transfer bench;Raised toilet [place]: Husband assists with standing in the shower     Prior Function  Prior Function  BADLs: Requires assist  IADLs: Requires assist  Functional Mobility: Requires assist  Type of Occupation: on disability for RA  Details of previous level of function: Has been using the [place] for ~3 weeks, has not left the house due to weakness. Had a recent fall when her "legs gave out" while walking in the living room. Orders groeries online, husband assists with IADls     ADL and Functional Transfer Status reflective of last 24 hours          Grooming: Set up or clean up assistance;Position- sitting   Grooming Comments: wash face, comb hair, hair washing cap   Oral Hygiene: Set up or clean up assistance;Position-sitting        Upper Body Dressing: Moderate Assist (between 25-49% help needed)  UE Dressing Comments: for line mgmt with gown             Putting on/taking off footwear: Dependent (greater than 75% help needed)     Putting on/taking off footwear Comment: seated EOB   Toileting: Contact guard assist (touching/steadying assistance)   Toileting Comments: foley catheter. able to complete peri and buttocks hygiene seated on toilet. Pt states difficulty reaching buttocks on modified toilet at home due to body habitus    Bathing: Position- sitting;Supervision / Stand by assistance   Bathing Comments: seated EOB. Able to reach  all body parts. Pt brings LE up on bed in modified frog position to reach     Bed Mobility  Rolling Left: Supervision / Stand by assistance  Rolling Right: Supervision / Stand by assistance;Railings used  Supine>Sit: Supervision / Stand by assistance  Scooting: Supervision / Stand by assistance     Transfers  Sit<>Stand: Minimum Assist (less than 25% help needed)  Sit <> Stand Comments: FWW  Bed<>Chair/WC: Minimum Assist (less than 25% help needed)  Bed<>Chair/Wheelchair Comments: FWW  Toilet Transfer: Minimum Assist (less than 25% help needed)  Toilet Transfers Comments: FWW     Functional Mobility  Distance: 78f  Assistance Needs: Minimum Assist (less than 25% help needed)  Functional Mobility Comments: slow pace with FWW. Assist for line mgmt and IV pole. Pt dropping to 89% on RA, returned to supplemental O2          See flowsheet for objective data reflective of today's session.    Assessment  Impact on Function and Participation: LKyaniwas motivated for OT today. Pt can complete ADLs and functional mobility with light assistance, though she needs seated rest breaks after ambulating ~164for standing for 1 minute. Anticipate steady progress. Pt has to navigate stairs to enter her home    Barriers to Discharge: stairs to enter home   Evaluation/Treatment Tolerance: Patient tolerated treatment well   OT Coverage for Next Shift: progress mobility, OOB ADLs, assess for  AE needs, standing tolerance       Discharge Recommendations  OT Discharge Recommendations: Follow up with OT at next level of care   Assistance with : ADLs, Functional mobility and transfers, Home safety, IADLs         Equipment Recommended: Raised toilet seat, Shower seat with back, Grab bars      Goals   Goal: Tub/shower transfer, Bathing, Contact guard assist (touching/steadying assistance)  Estimated Completion Date: 08/29/22                                 Chelsea Primus, OT  08/22/2022

## 2022-08-22 NOTE — Op Note (Signed)
Neurosurgery Operative Note   Autumn Camacho - DOB: 29-Mar-1966 (56 year old female) MRN: [id number]  Procedure Date: 08/21/2022 Location:[facility] MAIN OR       Preoperative Diagnosis:   Cervical Myelopathy  C7-T1 herniated disk  Rheumatoid arthritis  Class 1 Obesity, BMI 32.5    Post Operative Diagnosis:   Cervical Myelopathy  C7-T1 herniated disk s/p diskectomy and fusion  Rheumatoid arthritis  Class 1 Obesity, BMI 32.5    Procedures Performed:   Left sided approach for C7-T1 anterior cervical diskectomy and fusion  C7-T1 arthrodesis, anterior interbody  Placement of stand alone biomechanical device into C7-T1 disc space (no plate)   Use of microscope for microdissection  Use of autograft  Use of morselized allograft (DBX)  Use of C-arm fluoroscopy  Use of neuromonitoring      Modifier-22 Attestation: This surgery in an acutely ill patient required significantly increased service intensity, a substantial increase in procedural time, increased technical difficulty due significant anatomical abnormalities and body habitus, and increased physical & mental effort.    Surgeons:  Primary: Caleb Popp, MD  Resident - Assisting: Lorin Picket, MD; Ezzard Standing, MD    Anesthesia:  General  EBL: 30 mL   Specimens:   ID Type Source Tests Collected by Time   1 : C7-T1 disc Tissue Intervertebral disc PATHOLOGY, SURGICAL Caleb Popp, MD 08/21/2022 1457      Implants:  Implant Name Type Inv. Item Serial No. Manufacturer Lot No. LRB No. Used Action   TISSUE FREEZE DRY FILLER 1CC DBX - [id number] - [id number]  TISSUE FREEZE DRY FILLER 1CC DBX [id number] [employer]  N/A 1 Implanted   SPACER SPINAL COALITION MIS TI 7D 12MM 14MM 7MM - [id number]  SPACER SPINAL COALITION MIS TI 7D 12MM 14MM 7MM  [employer]  N/A 1 Implanted   SPACER SPINE COALITION ANCHOR 12MM - [id number]  SPACER SPINE COALITION ANCHOR 12MM  [employer]  N/A 1 Implanted         Indication(s): Autumn Camacho is an 56 year old female with rheumatoid arthritis, degeneration disk disease, and class 1 obesity who presents with acute worsening of gait instability, arm and leg weakness after suffering a ground level fall. MRI revealed T2 cord signal change at C7-T1 with a herniated disk.    Based on these findings, a decompression and fusion was recommended to the patient.    The risks, benefits, and alternatives were discussed at length. The risks of surgery included, but were not limited to: bleeding; infections; spinal cord injury or nerve root injury that could result in bowel/bladder dysfunction, motor weakness, or sensory loss, CSF leaks that could require further procedures to resolve; poor arthrodesis and/or hardware malfunction that could require further interventions; and then the general risks of undergoing general anesthesia including development coma, cardiopulmonary instability, or even death. The patient voiced understanding and agreed to proceed with surgery.      Finding(s):   Well decompressed thecal sac   Well placed hardware  Adequate hemostasis  Stable neuromonitoring signals throughout the case       Procedure Details:   After appropriately identifying and marking the patient in the preoperative holding area, the patient was brought into the operating room. After a first time out, the patient was intubated uneventfully while carefully maintaining normal cervical alignment. Appropriate lines, foley catheter, sequential compression device boots, and neuromonitoring electrodes were placed by the OR team.  Neuromonitoring baselines were obtained.  Head was placed on a gel roll and a chinstrap was applied to maintain neck extension and to keep head stable during the surgery.  A shoulder roll was placed to improve with neck extension.  Both arms were taped to side and shoulder were placed on gentle retraction with adhesive tape to improve fluoroscopic visualization  of the operative spinal level.     A Bair hugger was applied to the lower body to maintain normothermia.  All pressure points were carefully padded.  Preoperative prophylactic IV antibiotics were initiated.     Intraoperative fluoroscopy was used to localize the operative levels and the planned left sided skin incision was marked out and prepped and draped in the usual fashion.     A second time out was performed.     A skin incision was made with a 10-blade scalpel.  The platysma muscle was incised and then using a combination of blunt and sharp dissection through the anterior neck, the C7-T1 disc space was exposed.   Bilateral longus coli were dissected off with bipolar electrocautery.  Intraoperative fluoroscopy confirmed exposure of the correct spinal levels.  Retractors were placed underneath the longus coli muscle cuff bilaterally .  Distractor pins were placed in the vertebral bodies of C7 & T1 and the retractor was attached to these pins to improve access to the C7-T1 disc space.     The C7-T1 disk space was entered by making an annulotomy with a #15 blade.  A C7-T1 discectomy was performed using a combination of curettes, pituitary rongeur, Kerrison rongeurs, and the drill.  The osteophytes were drilled down until the endplates were visualized.       The bone dust from the drilling was saved and packed into the biomechanical interbody device to serve as autograft to promote bony fusion.     An appropriately sized interbody was placed into the disc space: [employer] MIS titanium 20m x [id number] 7 degrees lordosis. Two anchors were placed through the stand alone cage to stabilize it in the disk space (no plate).      Position of the hardware was confirmed with AP and lateral fluoroscopy.     The wound was copiously irrigated with 1 liter of sterile fluid and meticulous hemostasis was obtained.        The muscle layer was approximated with 2 vicryl interrupted sutures. The skin was closed with a  subcuticular stitch.  A sterile dressing was applied.       All counts were correct.     The patient was extubated and transferred to the recovery unit in stable condition.       I was present for the key/critical portions of the procedure and was immediately available for the remainder.     Complications: None.     Condition: stable

## 2022-08-22 NOTE — Discharge Instructions (Addendum)
INSTRUCTIONS FOR AFTER YOUR SPINAL FUSION SURGERY:  --No NSAIDs (Ibuprofen, Aleve, Advil, Naproxen, Diclofenac, Motrin, etc) for 6 weeks after surgery  --Call for any redness, discharge or opening from your wound  --Call for increased swelling around your wound.  --Ok to shower 48 hours after surgery, use regular shampoo, avoid active scrubbing of the surgical incision  --Avoid soaking incision until cleared by Neurosurgery (no bath, pools, hot tubs)   --Keep incision open to air, do not apply any ointments   --Expect to have new nerve pain in a similar distribution following surgery. This pain should greatly improve over the first 2-3 weeks. Please call clinic with concerns  --If you were discharged in a brace you must wear when upright for 6 weeks post op. You do not need to wear your brace while lying or sleeping. You may remove for short periods for showering.     Contact the Neurosurgery team if you develop any of the following:   Severe or unusual headache    Nausea and vomiting    Temperature over 101.5    Problems with balance or falls    Purulent discharge from wound    Stroke-like symptoms such as facial drop, weakness, numbness.    New extremity weakness    Loss of bowel and bladder control    Personality changes, confusion, memory problems, seizures   Call the Clinic Monday-Friday during business hours at [phone]  After business hours, weekends or holidays call [phone]    Activity:   --You are restricted from heavy lifting for 3 months post op.   Month 1: Do not lift, pull, push objects weighing over 10 pounds  Month 2: Do not lift, pull, push objects weighing over 20 pounds  Month 3: Do not lift, pull, push objects weighing over 30 pounds  --Frequent walking is encouraged  --Avoid repetitive bending or twisting of spine  --Avoid high impact activities (running, contact sports, etc.) until cleared by Neurosurgery  --You may drive once off narcotic pain medications       Ok to restart  your medications for rheumatoid arthritis, hold meloxicam for 2 weeks, if pain increases ok to restart on one week.

## 2022-08-22 NOTE — Nursing Note (Signed)
Patient Summary  56 year old female with history of known spinal stenosis presenting to the emergency department with worsening back pain, new numbness to left leg, chronic, unchanged numbness to right leg along with progressive incontinence of stool and worsening balance issues/gait instability    A+OX4, VSS. Pt reports sensation is improving, now with numbness only to bottom of bilateral feet. Pt transitioned off PCA and onto oxycodone. Ketamine gtt turned off. Foley d/c'd. Voided x 1 but was unmeasured as urine missed hat. Pt declined laxatives and requested metamucil, metamucil given. Hypoactive Bts, +flatus. Anterior neck incision OTA. 1 person assist w/FWW. Pt is working with PT/OT. IV saline locked.

## 2022-08-22 NOTE — Hospital Course (Signed)
Per Hardie Shackleton H&P  Autumn Camacho is a 56 year old female with RA who presented to [facility] after mGLF found to have degenerative changes in C7-T1 and multilevel lumbar degenerative changes most significant L4-5. She is an established patient of Dr. Reesa Chew and was supposed to see him for follow-up on Monday but came into the ED due to the fall that happened yesterday. She reports that she has been having progressively worsening LBP over the past couple weeks which involve pain/numbness/tingling from her waist down her legs bilaterally to her feet. She also has been having urinary and bowel incontinence.     Pt underwent L ACDF C7 to T1 w Dr. Reesa Chew on 08/21/22. Surgery went well, no complications, pt was monitored and remained neurologically stable. Pt was able to participate with therapies, pain was controlled with oral medications, tolerated oral intake. Therefore it was recommended to discharge pt home today under family supervision.

## 2022-08-22 NOTE — Progress Notes (Signed)
Progress Note     Autumn Camacho") - DOB: 08-Feb-1966 (56 year old female)  Gender Identity: Female  Preferred Pronouns: she/her/hers  Admit Date: 08/20/2022  Code Status: Full Code       CHIEF CONCERN / IDENTIFICATION:     56 year old female (with opioid sensitivity to HM) with PMH RA/osteoarthritis, who presented to [facility] ED after GLF and found to have C7-T1 herniated disc s/p C7-T1 ACDF on 08/21/22.      SUBJECTIVE   INTERVAL HISTORY:  No acute events overnight  No PCA clinician boluses  Morphine PCA shift use 3 mg / 8 hours  States her surgical site neck pain is well controlled  Continues to have lower back pain that is her baseline preoperatively  Endorsing tingling in her left upper extremity, right upper extremity tingling improved  Discussed updating her primary surgical team on her improving neuro exam     OBJECTIVE        T: 36.9 C (08/22/22 1108)  BP: 102/70 (08/22/22 0954)  HR: (!) 114 (08/22/22 0954)  RR: 18 (08/22/22 0954)  SpO2: 92 % (08/22/22 0954) Room air  T range: Temp  Min: 35.9 C  Max: 36.9 C  Admit weight: 78 kg (172 lb) (08/20/22 2246)  Last weight: 78 kg (172 lb) (08/20/22 2246)       I&Os:   Intake/Output Summary (Last 24 hours) at 08/22/2022 1241  Last data filed at 08/22/2022 1109  Intake 1400 ml   Output 3100 ml   Net -1700 ml       Physical Exam  CONSTITUTIONAL: Sitting up in the chair comfortably, in no acute distress  MENTAL STATUS: appropriate mood  HENMT: normocephalic, MMM, left neck surgical incision  RESPIRATORY: Breathing comfortably on RA, no increased work of breathing  CARDIOVASCULAR: Well perfused.  ABDOMEN/GI: non-distended  SKIN: no gross lesions or cyanosis  MUSCULOSKELETAL: normal bulk and tone  NEURO: awake, alert, motor and sensation grossly intact bilaterally, answers questions appropriately      Labs (last 24 hours):   Chemistries  CBC  LFT  Gases, other   140 109 9 105   11.8   AST: - ALT: -  7.36/-/-/24  -/-/-/-   4.0 23 0.53   10.01 >< 197  AP: - T  bili: -  Lact (a): - Lact (v): -   eGFR: >60 Ca: 8.8   36   Prot: - Alb: -  Trop I: - D-dimer: -   Mg: - PO4: -  ANC: -     BNP: - Anti-Xa: -     ALC: -    INR: -           Reviewed Results? Independently visualized & interpreted? Key Findings     Lab _0  _1     Radiology _2  _3     EKG/Tele/Echo  _4  _5     Other?  _6  _7             ASSESSMENT/PLAN    Pain Care Impression: Postop day 1 ACDF C7-T1, only taking meloxicam at home.  Pain well controlled on morphine PCA with relatively low shift use overnight.  We will discontinue ketamine gtt and morphine PCA and transition to low-dose oral regimen with plans to sign off if pain continues to be well controlled.    Sch  APAP 1g q6h   Gabapentin 300/300/600 (home dose)    PRN  Oxycodone 5-7.3m q4h (regular post surgical opioid taper as outpatient)  Trazodone 220mqhs (home  med, held)    Thank you for involving [place] in the care of this patient. APS will sign off at this time, but please do not hesitate to contact us if patient's clinical status changes.     Belenda Cruise, MD, 08/22/2022 12:48 PM

## 2022-08-22 NOTE — Nursing Note (Signed)
Patient Summary  56 year old female with history of known spinal stenosis presenting to the emergency department with worsening back pain, new numbness to left leg, chronic, unchanged numbness to right leg along with progressive incontinence of stool and worsening balance issues/gait instability  Overnight pt arrived to the floor post op, VSS, pain managed with morphine PCA and ketamine. Pt also has foley with good output. Pt states pain is well controlled at 2-3/10  Edited by: Leontine Locket, RN at 08/22/2022 0533    Illness Severity  Stable  Edited by: Leontine Locket, RN at 08/21/2022 [phone]

## 2022-08-22 NOTE — Progress Notes (Signed)
Physical Therapy  Physical Therapy Evaluation    Demiah Gullickson  [id number]     08/22/2022  Duration of session: 40 Minutes    Patient Active Problem List   Diagnosis    Rheumatoid arthritis involving multiple sites [facility])    Chronic right hip pain    Rheumatoid arthritis ([place])    Rheumatoid arthritis involving right hand (HCC)    Arthritis of carpometacarpal (CMC) joints of both thumbs    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    Osteoarthritis of right hip    Osteoarthritis of right knee    Knee dislocation, left, initial encounter    Failed total left knee replacement (HCC)    Chronic instability of left knee    Failed total knee replacement, subsequent encounter    Status post revision of total replacement of left knee    Chronic right-sided low back pain with sciatica    Degenerative disc disease, lumbar    Cervical stenosis of spinal canal       Past Medical History:   Diagnosis Date    Chronic right hip pain 02/26/2019    Allergic asthma     Fracture     Bilateral elbow fx    Genital herpes     Rheumatoid arthritis ([place])     Rheumatoid arthritis ([place])     Spinal stenosis     Tooth disorder     MISSING TOOTH UPPER LEFT BACK-MOLAR       Past Surgical History:   Procedure Laterality Date    foot Bilateral     Arch surgery    KNEE ARTHROPLASTY      LEFT 2018 REPAIR 2019    KNEE ARTHROPLASTY Left 05/01/2020    PR ADENOIDECTOMY PRIMARY <AGE 50      AGE 34  T/A     PR ANES; TOTAL KNEE REPLACEMENT Left 04/01/2020    PR TONSILLECTOMY ONE-HALF <AGE 50      PR UNLISTED PROCEDURE FEMUR/KNEE Bilateral     TKA    PR UNLISTED PROCEDURE FEMUR/KNEE      Left revision TKA     PR UNLISTED PROCEDURE FOOT/TOES Bilateral 2009 R,  2010 L    Reconstructive surgery    PR UNLISTED PROCEDURE PELVIS/HIP JOINT      DOS 07/30/20 R THA    TKA Left 2019    revision from prior TKA       Subjective  Patient Report/Self-Assessment: Patient agreeable to PT session         Home Living  Home Living Comments:  Husband assists with standing in the shower  Type of Home: Multilevel home  Entry Stairs: 13 steps  Indoor Stairs: 0  Lives With: Spouse;Daughter  Assistance Available at Home : 24 hour assistance  Mobility Equipment Owned: [place]: Tub transfer bench;Raised toilet seat    Prior Level of Function : Patient reports having been independent with 2ww prior.          Objective  Static Sitting Balance   Static Sitting Balance Level of Assistance: Independent    Dynamic Sitting Balance  Dynamic Sitting Balance Level of Assistance: Supervision / Stand by assistance    Static Standing Balance  Static Standing Balance Level of Assistance: Contact guard assist (touching/steadying assistance)  Dynamic Standing Balance  Dynamic Standing Balance Level of Assistance: Minimum Assist (less than 25% help needed)           Functional Level:  Bed Mobility  Rolling Left: Supervision / Stand by assistance  Rolling Right: Supervision / Stand by assistance  Supine to Sit: Minimum Assist (less than 25% help needed)  Sit to Supine: Minimum Assist (less than 25% help needed)  Other Bed Mobilty Details: assist at trunk and BLEs, cues for technique    Transfers  Sit<>Stand: Minimum Assist (less than 25% help needed);Assistive device used  Sit<>Stand Assistive Device Details: Front wheeled walker  Stand <> Sit: Minimum Assist (less than 25% help needed);Assistive device used  Stand <>Sit Assistive Device Details: Front wheeled walker  Bed<>Chair/WC: Minimum Assist (less than 25% help needed);Assistive device used  Bed<>Chair/WC Assistive Device Details: Front wheeled Psychologist, sport and exercise: 120 ft  Assistance Needs: Contact guard assist (touching/steadying assistance)  Assistive Device Used: Front wheeled walker  Gait quality/descriptors: Foot drop;Decreased step  length;Decreased foot clearance  Ambulation Comment: mild unsteadiness, cues to avoid excessive loading of UEs    Stairs  Number of steps: 2  Style: Step to  Assistance Needs: Moderate Assist (between 25-49% help needed)  Assistive Device Used: 1 railing;Other (right rail with left hand braced on wall (home set-up))  Comments: assist for support and stability, with cues for technique           Interventions:  Therex:                            Theract: bed mobility     transfers     education on precautions, safety, mobility guidelines, discharge planning              Balance/Neuro Re-ed:                             Gait Training: ambulation with 2ww as detailed above     stairs trial, with 2 steps completed at a mod assist level. Limited eccentric control                                   ASSESSMENT/PLAN     Latoyia is seen for physical therapy evaluation this session on POD 1 s/p C7-T1 ACDF. Reports having been independent with 2ww at baseline, and presents at a min assist level for transfers and ambulation today. Mod assist level to navigate 2 stairs, and needs to be able to complete 8 stairs to enter/exit her home. Does have family assist at home if needed on discharge.      Barriers to Discharge: stairs navigation           PT Discharge Equipment: none; owns 2ww       Goals:  Goal: Bed mobility, Independent  Estimated Completion Date: 08/29/22   Goal: Transfer with AD, Independent  Estimated Completion Date: 08/29/22   Goal: Amb with AD, Independent (at least 50 ft)  Estimated Completion Date: 08/29/22  Goal: 8 stairs with  supervision and 1 rail  Estimated Completion Date: 08/29/22       PT Plan of Care:  Treatment/Interventions: Therapeutic exercise, Therapeutic functional activities, Gait/Stairs training, IT trainer, Equipment eval/education     PT Plan: Skilled PT     PT Dosage: 3-5x/week     Plan for Next Session: progress transfers, ambulation, stairs navigation, log roll      Other  Recommendations:  PT Discharge Recommendations: Follow up with PT at next level of care (to be determined by primary team)        Toma Deiters, PT

## 2022-08-22 NOTE — Progress Notes (Signed)
[facility] NEUROLOGICAL SURGERY  Progress Note Date of Service: 08/22/22   Date of Admission: 08/20/2022  Hospital Day: 2  Post Op Date: 83 Day Post-Op      IDENTIFICATION  56 year old female // [G] falls, LUE weak, LBP; C7-T1, L2-3, L4-5 disc->C7-T1 ACDF (90D) [RA]  Edited by: Ezzard Standing, MD at 08/21/2022 1613       INTERVAL EVENTS   NAEON     OBJECTIVE   EXAM  Alert and conversant  EOMI  FS    R L  UPPER  D  5 5  B (C5-6) 5 5  T (C7-8) 5 5  WE (C6) 5 5  FDP (C8) 5 5  ADM (T1) 5 5        LABS  Lab Results   Component Value Date    SODIUM 140 08/22/2022    SODIUM 142 08/21/2022    SODIUM 140 08/21/2022    WBC 10.01 08/22/2022    WBC 4.17 08/21/2022    HEMATOCRIT 36 08/22/2022    HEMATOCRIT 41 08/21/2022    PLATELET 197 08/22/2022    PLATELET 210 08/21/2022    INR 1.0 08/21/2022    INR 1.0 08/20/2022        ASSESSMENT & PLAN       - NSGY acute care Q4H neuro checks  Upright C spine XR when able  No drain  SQH  PT/OT  Foley out today  Dispo: may need further surgery to address lumbar spine while in house -> if no issues per pt and pending PT/OT clearance and pt readiness she can d/c today

## 2022-08-23 ENCOUNTER — Inpatient Hospital Stay (HOSPITAL_COMMUNITY): Payer: No Typology Code available for payment source

## 2022-08-23 ENCOUNTER — Other Ambulatory Visit (HOSPITAL_BASED_OUTPATIENT_CLINIC_OR_DEPARTMENT_OTHER): Payer: Self-pay

## 2022-08-23 DIAGNOSIS — R69 Illness, unspecified: Secondary | ICD-10-CM

## 2022-08-23 DIAGNOSIS — M4802 Spinal stenosis, cervical region: Secondary | ICD-10-CM

## 2022-08-23 LAB — CBC (HEMOGRAM)
Hematocrit: 34 % — ABNORMAL LOW (ref 36.0–45.0)
Hemoglobin: 11 g/dL — ABNORMAL LOW (ref 11.5–15.5)
MCH: 33 pg (ref 27.3–33.6)
MCHC: 32.2 g/dL (ref 32.2–36.5)
MCV: 103 fL — ABNORMAL HIGH (ref 81–98)
Platelet Count: 186 10*3/uL (ref 150–400)
RBC: 3.33 10*6/uL — ABNORMAL LOW (ref 3.80–5.00)
RDW-CV: 13.3 % (ref 11.0–14.5)
WBC: 7.5 10*3/uL (ref 4.3–10.0)

## 2022-08-23 LAB — BASIC METABOLIC PANEL
Anion Gap: 10 (ref 4–12)
Calcium: 8.6 mg/dL — ABNORMAL LOW (ref 8.9–10.2)
Carbon Dioxide, Total: 25 meq/L (ref 22–32)
Chloride: 109 meq/L — ABNORMAL HIGH (ref 98–108)
Creatinine: 0.45 mg/dL (ref 0.38–1.02)
Glucose: 81 mg/dL (ref 62–125)
Potassium: 4 meq/L (ref 3.6–5.2)
Sodium: 144 meq/L (ref 135–145)
Urea Nitrogen: 12 mg/dL (ref 8–21)
eGFR by CKD-EPI 2021: >60 mL/min/{1.73_m2} (ref >59)

## 2022-08-23 MED ORDER — POLYETHYLENE GLYCOL 3350 17 GM/SCOOP OR POWD
17.0000 g | Freq: Every day | ORAL | 0 refills | Status: DC
Start: 2022-08-23 — End: 2022-12-04
  Filled 2022-08-23: qty 476, 28d supply, fill #0

## 2022-08-23 MED ORDER — SODIUM CHLORIDE 0.9% IV BOLUS
1000.0000 mL | Freq: Once | INTRAVENOUS | Status: DC
Start: 2022-08-23 — End: 2022-08-23

## 2022-08-23 MED ORDER — GABAPENTIN 300 MG OR CAPS
ORAL_CAPSULE | ORAL | 0 refills | Status: DC
Start: 2022-08-23 — End: 2022-11-06
  Filled 2022-08-23: qty 90, 23d supply, fill #0

## 2022-08-23 MED ORDER — OXYCODONE HCL 5 MG OR TABS
5.0000 mg | ORAL_TABLET | ORAL | 0 refills | Status: DC | PRN
Start: 2022-08-23 — End: 2022-08-31
  Filled 2022-08-23: qty 42, 5d supply, fill #0

## 2022-08-23 MED ORDER — ONDANSETRON HCL 4 MG OR TABS
4.0000 mg | ORAL_TABLET | Freq: Three times a day (TID) | ORAL | 0 refills | Status: DC | PRN
Start: 2022-08-23 — End: 2022-11-06
  Filled 2022-08-23: qty 9, 3d supply, fill #0

## 2022-08-23 MED ORDER — PSYLLIUM 58.12 % OR PACK
1.0000 | PACK | Freq: Every day | ORAL | 0 refills | Status: DC | PRN
Start: 2022-08-23 — End: 2022-12-08
  Filled 2022-08-23: qty 30, 30d supply, fill #0

## 2022-08-23 MED ORDER — SENNOSIDES 8.6 MG OR TABS
17.2000 mg | ORAL_TABLET | Freq: Two times a day (BID) | ORAL | 0 refills | Status: DC
Start: 2022-08-23 — End: 2022-11-06
  Filled 2022-08-23: qty 12, 3d supply, fill #0

## 2022-08-23 NOTE — Discharge Summary (Signed)
Discharge Summary     [facility] Autumn Camacho") - DOB: 11-Oct-1966 (56 year old female)  Gender Identity: Female  Preferred Pronouns: she/her/hers  PCP: Autumn Koyanagi, MD   Code Status: Full Code        DATE OF ADMISSION: 08/20/2022  DATE OF DISCHARGE: 08/23/22  DISCHARGE TEAM & ATTENDING: Neurosurgery & Caleb Popp, MD     ADMISSION DIAGNOSIS: Cervical stenosis   DISCHARGE DIAGNOSIS: Cervical stenosis     PROBLEMS ADDRESSED DURING THIS HOSPITALIZATION:   Principal Problem:    Degenerative disc disease, lumbar  Active Problems:    Cervical stenosis of spinal canal  Resolved Problems:    * No resolved hospital problems. *      DISCHARGE FOLLOW-UP VISITS/APPOINTMENTS:    Upcoming appointments at [facility] Medicine:  Future Appointments   Date Time Provider [place]   10/05/2022 11:40 AM Ardith Dark, MD [facility] PAIN CL        Additional follow-up:  No follow-up provider specified.    PENDING RESULTS THAT REQUIRE FOLLOW-UP (as of this summary):  Pending Labs       Order Current Status    Basic Metabolic Panel Collected (08/23/22 0706)    CBC Collected (08/23/22 0706)    Pathology, Surgical In process            THERAPEUTIC RECOMMENDATIONS:   NSTRUCTIONS FOR AFTER YOUR SPINAL FUSION SURGERY:  --No NSAIDs (Ibuprofen, Aleve, Advil, Naproxen, Diclofenac, Motrin, etc) for 6 weeks after surgery  --Call for any redness, discharge or opening from your wound  --Call for increased swelling around your wound.  --Ok to shower 48 hours after surgery, use regular shampoo, avoid active scrubbing of the surgical incision  --Avoid soaking incision until cleared by Neurosurgery (no bath, pools, hot tubs)   --Keep incision open to air, do not apply any ointments   --Expect to have new nerve pain in a similar distribution following surgery. This pain should greatly improve over the first 2-3 weeks. Please call clinic with concerns  --If you were discharged in a brace you must wear when upright for 6 weeks post  op. You do not need to wear your brace while lying or sleeping. You may remove for short periods for showering.     Contact the Neurosurgery team if you develop any of the following:   Severe or unusual headache    Nausea and vomiting    Temperature over 101.5    Problems with balance or falls    Purulent discharge from wound    Stroke-like symptoms such as facial drop, weakness, numbness.    New extremity weakness    Loss of bowel and bladder control    Personality changes, confusion, memory problems, seizures   Call the Clinic Monday-Friday during business hours at [phone]  After business hours, weekends or holidays call [phone]    Activity:   --You are restricted from heavy lifting for 3 months post op.   Month 1: Do not lift, pull, push objects weighing over 10 pounds  Month 2: Do not lift, pull, push objects weighing over 20 pounds  Month 3: Do not lift, pull, push objects weighing over 30 pounds  --Frequent walking is encouraged  --Avoid repetitive bending or twisting of spine  --Avoid high impact activities (running, contact sports, etc.) until cleared by Neurosurgery  --You may drive once off narcotic pain medications       Ok to restart your medications for rheumatoid arthritis, hold meloxicam for 2 weeks,  if pain increases ok to restart on one week.       ALLERGIES:  Fentanyl, Hydromorphone, Remicade [infliximab], Codeine, and Nortriptyline      DISCHARGE MEDICATIONS:   Current Discharge Medication List        START taking these medications    Details   gabapentin 300 MG capsule Take 300 mg bid and 600 mg before bedtime  Qty: 90 capsule, Refills: 0    Associated Diagnoses: Cervical stenosis of spinal canal      ondansetron 4 MG tablet Take 1 tablet (4 mg) by mouth every 8 hours as needed for nausea/vomiting.  Qty: 12 tablet, Refills: 0    Associated Diagnoses: Cervical stenosis of spinal canal      polyethylene glycol 3350 17 g packet Take 1 packet (17 g) by mouth daily. Dissolve in  4-8 ounce water.  Qty: 30 packet, Refills: 0    Associated Diagnoses: Cervical stenosis of spinal canal      psyllium 58.12 % packet Take 1 packet by mouth daily as needed for constipation. Mix in 8 oz of juice or water and drink.  Qty: 30 packet, Refills: 0    Associated Diagnoses: Cervical stenosis of spinal canal      senna 8.6 MG tablet Take 2 tablets (17.2 mg) by mouth 2 times a day.  Qty: 12 tablet, Refills: 0    Associated Diagnoses: Cervical stenosis of spinal canal           CONTINUE these medications which have CHANGED    Details   oxyCODONE 5 MG tablet Take 1-1.5 tablets (5-7.5 mg) by mouth every 4 hours as needed for moderate pain or severe pain for up to 7 days.  Qty: 42 tablet, Refills: 0    Associated Diagnoses: Cervical stenosis of spinal canal           CONTINUE these medications which have NOT CHANGED    Details   acetaminophen 500 MG tablet Take 2 tablets (1,000 mg) by mouth every 6 hours as needed.      certolizumab (Cimzia Starter Kit) 6 X 200 MG/ML prefilled syringe kit Inject 2 syringes (400 mg) under the skin initially, and at weeks 2 and 4 followed by 200 mg every other week  Qty: 3 each, Refills: 0    Associated Diagnoses: Rheumatoid arthritis involving multiple sites with positive rheumatoid factor (HCC)      certolizumab (Cimzia) 2 X 200 MG/ML prefilled syringe kit Inject 1 mL (200 mg) under the skin every 2 weeks.  Qty: 2 each, Refills: 1    Associated Diagnoses: Rheumatoid arthritis involving multiple sites with positive rheumatoid factor (HCC)      Fexofenadine HCl (ALLEGRA OR) Take 1 tablet by mouth daily.      leflunomide 20 MG tablet TAKE 1 TABLET DAILY  Qty: 90 tablet, Refills: 1    Associated Diagnoses: Rheumatoid arthritis involving multiple sites with positive rheumatoid factor (HCC)      mupirocin 2 % ointment Apply 1 application  (1 g) topically daily. Apply to nose.      valACYclovir 1 g tablet Take 1 tablet (1,000 mg) by mouth 2 times a day.           STOP taking these  medications       albuterol HFA 108 (90 Base) MCG/ACT inhaler Comments:   Reason for Stopping:         diazePAM 5 MG tablet Comments:   Reason for Stopping:  fluticasone propionate HFA 220 MCG/ACT inhaler Comments:   Reason for Stopping:         meloxicam 7.5 MG tablet Comments:   Reason for Stopping:               BRIEF ADMISSION HISTORY/HOSPITAL COURSE:   Per [facility] H&P  Autumn Camacho is a 56 year old female with RA who presented [facility] after mGLF found to have degenerative changes in C7-T1 and multilevel lumbar degenerative changes most significant L4-5. She is an established patient of Dr. Reesa Chew and was supposed to see him for follow-up on Monday but came into the ED due to the fall that happened yesterday. She reports that she has been having progressively worsening LBP over the past couple weeks which involve pain/numbness/tingling from her waist down her legs bilaterally to her feet. She also has been having urinary and bowel incontinence.     Pt underwent L ACDF C7 to T1 w Dr. Reesa Chew on 08/21/22. Surgery went well, no complications, pt was monitored and remained neurologically stable. Pt was able to participate with therapies, pain was controlled with oral medications, tolerated oral intake. Therefore it was recommended to discharge pt home today under family supervision.       DISPOSITION:    Home    CONDITION: good     CONSULTS COMPLETED:    IP CONSULT TO PAIN MANAGEMENT     OPERATIONS/PROCEDURES:  Surgical/Procedural Cases on this Admission       Case IDs Date Procedure Surgeon Location Status    [phone] 08/21/22 Cervical 7-Thoracic 1 DISCECTOMY, SPINE, CERVICAL, ANTERIOR APPROACH, WITH FUSION USING INSTRUMENTATION Reesa Chew, Stacie Acres, MD [facility] MAIN OR Sch    [id number] 08/21/22 LEFT C7-T1 DISCECTOMY, SPINE, CERVICAL, ANTERIOR APPROACH, WITH FUSION USING INSTRUMENTATION, Additional levels as needed Amin, Stacie Acres, MD [facility] MAIN OR Can            Additional procedures:  None      PRINCIPAL DIAGNOSTIC STUDIES/RESULTS:    Imaging Results:  XR C Spine 2-3 Vw  Addendum: ADDENDUM:     Of note, images revisited upon a request by Dr. Alvira Monday, while the lateral  fluoroscopic images were limited in terms of determining the level, on the  frontal view ACDF level needs to be corrected as C7-T1 level as planned  presurgically.  Narrative: EXAMINATION:  XR C SPINE 2-3 VW    CLINICAL INDICATION:  Cervical 6-7 Thoracic 1 DISCECTOMY, SPINE, CERVICAL, ANTERIOR APPROACH, WITH FUSION USING INSTRUMENTATION     COMPARISON:   MR C-spine dated 08/21/2022    FINDINGS AND  Impression: 11 intraoperative fluoroscopic images demonstrate ongoing C6-7 discectomy and anterior spinal fusion. Please refer to operative report for additional details.    I have personally reviewed the images and agree with the report (or as edited).        No discharge procedures on file.    DISCHARGE PHYSICAL EXAM:   Vitals (Most recent in last 24 hrs)     T: 36.5 C (08/23/22 0540)  BP: 98/66 (08/23/22 0540)  HR: 77 (08/23/22 0540)  RR: 18 (08/23/22 0540)  SpO2: 94 % (08/23/22 0540) Nasal cannula 2 L/min    T range: Temp  Min: 36.5 C  Max: 37.2 C  Admit weight: 78 kg (172 lb) (08/20/22 2246)  Last weight: 78 kg (172 lb) (08/20/22 2246)       Physical Exam  General Appearance: Pleasant, NAD, WH. WD  Integumentary: intact, no rash. Surgical incision has  been healing nicely, there is no any erythema, swelling, discharge or dehiscence.  Neurologic:   Mental status: Alert, cooperative and oriented x 3,   CN: CN II to XII grossly intact, negative PD    UE R L   D 5 5   B 5 5   T 5 5   WE 5 5   FDP 5 5   AMD 4* 4*        Due to severe RA   LE R L   IP 5 5   Q 5 5   TA 5 5   G 5 5   EHL  5 5           [facility] physicians mentioned in this note can be reached by calling the [facility] Medicine Paging Operator at [phone]. If any part of this transcript is missing or to request other transcripts for this patient call [phone]. For online  access to patient records enroll in [place] [facility] [email or web address].

## 2022-08-23 NOTE — H&P (View-Only) (Signed)
[facility] NEUROLOGICAL SURGERY  Progress Note Date of Service: 08/22/22   Date of Admission: 08/20/2022  Hospital Day: 3  Post Op Date: 2 Days Post-Op      IDENTIFICATION  56 year old female // [G] falls, LUE weak, LBP; C7-T1, L2-3, L4-5 disc->C7-T1 ACDF (90D) [RA]  Edited by: Lubertha South, MD at 08/21/2022 1613       INTERVAL EVENTS   NAEON     OBJECTIVE   EXAM  Alert and conversant  EOMI  FS    R L  UPPER  D  5 5  B (C5-6) 5 5  T (C7-8) 5 5  WE (C6) 5 5  FDP (C8) 5 5  ADM (T1) 5 5  ---  LOWER  IP (L2-3) 5 5  Q (L4)  5 5  TA (L4-5) 5 5  G (S1)  5 5  EHL (L5) 5 5          LABS  Lab Results   Component Value Date    SODIUM 144 08/23/2022    SODIUM 140 08/22/2022    WBC 7.50 08/23/2022    WBC 10.01 08/22/2022    HEMATOCRIT 34 08/23/2022    HEMATOCRIT 36 08/22/2022    PLATELET 186 08/23/2022    PLATELET 197 08/22/2022    INR 1.0 08/21/2022    INR 1.0 08/20/2022        ASSESSMENT & PLAN       - NSGY acute care Q4H neuro checks  Upright C spine XR when able  No drain  SQH  PT/OT- stairs today otherwise can DC  Foley out today  Dispo: if no issues per pt and pending PT/OT clearance and pt readiness she can d/c today

## 2022-08-23 NOTE — Progress Notes (Signed)
Occupational Therapy Note       Patient Name: Autumn Camacho  MRN: [id number]  Today's Date: 08/23/2022  Total Treatment time: 40 Minutes     ADL and Functional Transfer Status reflective of last 24 hours  Eating: Set up or clean up assistance   Eating Comments: reports assist needed to open packets due to RA in hands                   Upper Body Dressing: Supervision / Stand by assistance   UE Dressing Comments: gown        LE Dressing Comments: discussed lower body dressing - pt has adaptive tools at home , has no concerns about being able to manage at [place], supportive family able to assist as necessry         Putting on/taking off footwear Comment: discussed shoe/socks - pt has adaptive tools at home , has no concerns about being able to manage at [place]. supportive family able to assist as necessry   Toileting: Independent   Toileting Comments: grab bars and FWW, discussed home set up        Bathing Comments: OT answered pt questions about bathing to her stated satisfaction. Discussed incision precautions: no soaking, no scrubbing, pat dry. Pt has all necessary equipment for bathing at home     Bed Mobility  Rolling Left: Independent  Rolling Right: Independent  Supine>Sit: Independent  Sit>Supine: Independent  Scooting: Independent     Transfers  Sit<>Stand: Independent  Sit <> Stand Comments: FWW  Bed<>Chair/WC: Independent  Bed<>Chair/Wheelchair Comments: FWW  Toilet Transfer: Independent  Toilet Transfers Comments: FWW     Functional Mobility  Distance: in room with FWW  Assistance Needs: Independent (no LOB)  Functional Mobility Comments: VSS      edu re: role of OT, goals, poc. discussed mobility and ADL with surgical precautions, adaptations for ADL due to arthritic changes in hands, and how to transport objects at [facility] level.     See flowsheet for objective data reflective of today's session.    Assessment  Impact on Function and Participation: Javionna progressing nicely. She currently is independent to  light assist with all mobility and BADL at [facility] level. She has all necessary adaptive equipment and good family support. No further acute care OT needs. Anticipate d/c home when medically ready     Barriers to Discharge: None   Evaluation/Treatment Tolerance: Patient tolerated treatment well   OT Coverage for Next Shift: d/c OT 11/5       Discharge Recommendations  OT Discharge Recommendations: No OT follow up indicated  Assistance with : ADLs, Home safety, IADLs   Supervision with  : ADLs, Functional mobility and transfers     Equipment Recommended:  (pt has all necessary DME at home)      Goals   Goal: Tub/shower transfer, Bathing, Contact guard assist (touching/steadying assistance)  Estimated Completion Date: 08/29/22  Goal Status: Adequate for discharge                                 Lynnae January, OT  08/23/2022

## 2022-08-23 NOTE — Progress Notes (Signed)
Date of Face to Face Encounter: 08/23/2022    This Face to Face Encounter was in whole, or in part, for the following medical condition(s) which is the primary reason for home health care:  Medical Condition(s): (M54.2) Neck pain  (primary encounter diagnosis)  (M54.41,  G89.29) Chronic right-sided low back pain with right-sided sciatica  (M51.36) Degenerative disc disease, lumbar  (M48.02) Cervical stenosis of spinal canal  (M48.02) Cervical stenosis of spinal canal    I certify that, based on my findings, the following services are medically necessary for home health services: Physical Therapy    Ancillary Services (only allowed if Nursing, PT, or ST are ordered):  OT and [place]    My clinical findings that support the need for the above services are: s/p ACDF severe RA, daughter with autism    I certify that this patient is homebound (.i.e. absences from require considerable and taxing effort and are for medical reasons or religious services and are infrequent or of short duration) because: post surgical care     The [employer] Medicaid [employer] (CMS) requires the hospitalist/attending signing the Face to Face Encounter documentation to identify the community physician in the discharge plan for home health care (the provider assuming ongoing management of the [place]).    Primary Care Physician: Terressa Koyanagi, MD    Attending Name: Caleb Popp  08/23/2022 at 9:26 AM

## 2022-08-23 NOTE — Nursing Note (Signed)
Patient Summary  Patient Summary  56 year old female with history of known spinal stenosis presenting to the emergency department with worsening back pain, new numbness to left leg, chronic, unchanged numbness to right leg along with progressive incontinence of stool and worsening balance issues/gait instability     A+OX4, hypotension with SBP 80s-90s, MAPS , asymptomatic. Pt found to be desating on 2 L NC while sleeping. Encouraged pt to use IS, pt now on RA with SpO2 above 92%. Pt reports sensation has improved to BLE. Pt c/o numbness from R hip/thigh down to toes of R foot. Surgical incision OTA. Pt cleared PT/OT. X ray done. Pt c/o mild to moderate pain while taking scheduled tylenol, scheduled gabapentin and PRN oxy. Voiding. BM x 1. MD order to d/c patient home. PIV d/c'd. SBP 90s before d/c. MD notified. Per MD, okay to encourage PO intake as pt is asymptomatic with SBP 90s. Teach back. Pt states she understands plan for discharge. Pt discharged from [facility].

## 2022-08-23 NOTE — Progress Notes (Signed)
Physical Therapy Treatment    Mable Dara  [id number]     08/23/2022  Duration of session: 27 Minutes    Subjective  Patient Report/Self-Assessment: agreeable to PT, reports feeling better today         Home Living  Home Living Comments: Husband assists with standing in the shower  Type of Home: Multilevel home  Entry Stairs: 13 steps  Indoor Stairs: 0  Lives With: Spouse, Daughter  Assistance Available at Home : 24 hour assistance  Mobility Equipment Owned: [place]: Tub transfer bench, Raised toilet seat    Functional Level:  Bed Mobility  Rolling Left: Independent  Rolling Right: Independent  Supine to Sit: Independent  Sit to Supine: Independent  Scooting: Independent  Other Bed Mobilty Details: assist at trunk and BLEs, cues for technique    Transfers  Sit<>Stand: Supervision / Stand by assistance  Sit<>Stand Assistive Device Details: Front wheeled walker  Stand <> Sit: Supervision / Stand by assistance  Stand <>Sit Assistive Device Details: Front wheeled walker  Bed<>Chair/WC: Minimum Assist (less than 25% help needed);Assistive device used  Bed<>Chair/WC Assistive Device Details: Industrial/product designer: 120 ft  Assistance Needs: Contact guard assist (touching/steadying assistance)  Assistive Device Used: Front wheeled walker  Gait quality/descriptors: Decreased step length  Ambulation Comment: pt. self corrects to use as little push through UEs as possible while remaining stable.    Stairs  Number of steps: 8  Style: Step to  Assistance Needs: Contact guard assist (touching/steadying assistance)  Assistive Device Used: 1 railing (L hand braced on wall going up, BUE on R rail going down)  Comments: CGA for safety, patient did not need physical assist this session           Interventions:  Therex:                            Theract: see above for objective data.     reviewed logrolling, able to demo from flat bed this session.     slow  but steady gait with FWW, no LOB noted, no physical assist needed.     stairs:  has good system in place from PTA, up step to, down sidestepping with BUEs on single rail.  Steady this session, CGA for safety.     reviewed use of gait belt for additional safety, reviewed home stair set up 2+11, did 8 in a row without issue and will have assist from spouse, do not anticpate issues with stairs at home.  Balance/Neuro Re-ed:                             Gait Training:                                           ASSESSMENT/PLAN     Twilla demonstrates nice progress with mobility this session.  She is able to move from supine <->sitting without assist today, demos gait with FWW x 120 ft, and requires only light touch for safety on the stairs.  She verbalizes how spouse assists at home which sounds safe and reasonable.  Anticipate DC home with assist from spouse and adult daughter when medically cleared.      Barriers  to Discharge: none noted at this time, has cleared stairs.           PT Discharge Equipment: none; owns 2ww       Goals:  Goal: Bed mobility, Independent  Estimated Completion Date: 08/23/22  Goal Status: Met   Goal: Transfer with AD, Independent  Estimated Completion Date: 08/29/22  Goal Status: Adequate for discharge   Goal: Amb with AD, Independent (at least 50 ft)  Estimated Completion Date: 08/29/22  Goal Status: Adequate for discharge  Goal: 8 stairs with supervision and 1 rail  Estimated Completion Date: 08/29/22  Goal Status: Adequate for discharge       PT Plan of Care:  Treatment/Interventions: Therapeutic exercise, Therapeutic functional activities, Gait/Stairs training, IT trainer, Equipment eval/education     PT Plan: Skilled PT     PT Dosage: 2-3x/week     Plan for Next Session: f/u with questions from pt. otherwise OK for DC      Other Recommendations:  PT Discharge Recommendations: Follow up with PT at next level of care (to be determined by primary team)   PT Daily Recommendations:  1PA supervision for amb with [place], PT

## 2022-08-23 NOTE — Progress Notes (Signed)
[facility] NEUROLOGICAL SURGERY  Progress Note Date of Service: 08/22/22   Date of Admission: 08/20/2022  Hospital Day: 3  Post Op Date: 2 Days Post-Op      IDENTIFICATION  56 year old female // [G] falls, LUE weak, LBP; C7-T1, L2-3, L4-5 disc->C7-T1 ACDF (90D) [RA]  Edited by: Ezzard Standing, MD at 08/21/2022 1613       INTERVAL EVENTS   NAEON     OBJECTIVE   EXAM  Alert and conversant  EOMI  FS    R L  UPPER  D  5 5  B (C5-6) 5 5  T (C7-8) 5 5  WE (C6) 5 5  FDP (C8) 5 5  ADM (T1) 5 5  ---  LOWER  IP (L2-3) 5 5  Q (L4)  5 5  TA (L4-5) 5 5  G (S1)  5 5  EHL (L5) 5 5          LABS  Lab Results   Component Value Date    SODIUM 144 08/23/2022    SODIUM 140 08/22/2022    WBC 7.50 08/23/2022    WBC 10.01 08/22/2022    HEMATOCRIT 34 08/23/2022    HEMATOCRIT 36 08/22/2022    PLATELET 186 08/23/2022    PLATELET 197 08/22/2022    INR 1.0 08/21/2022    INR 1.0 08/20/2022        ASSESSMENT & PLAN       - NSGY acute care Q4H neuro checks  Upright C spine XR when able  No drain  SQH  PT/OT- stairs today otherwise can DC  Foley out today  Dispo: if no issues per pt and pending PT/OT clearance and pt readiness she can d/c today

## 2022-08-23 NOTE — Nursing Note (Signed)
Patient Summary  Patient Summary  56 year old female with history of known spinal stenosis presenting to the emergency department with worsening back pain, new numbness to left leg, chronic, unchanged numbness to right leg along with progressive incontinence of stool and worsening balance issues/gait instability     A+OX4, VSS. Pt reports sensation is improving, now with numbness only to bottom of bilateral feet. Pt transitioned off PCA and onto oxycodone. Ketamine gtt turned off. Foley d/c'd. Patient voiding >464m. Pt declined laxatives and requested metamucil, metamucil given. Hypoactive Bts, +flatus. Anterior neck incision OTA. 1 person assist w/FWW. Pt is working with PT/OT. IV saline locked.           Edited by: HFloyce Stakes RN at 08/23/2022 [phone]   Illness Severity  Stable.  Edited by: HFloyce Stakes RN at 08/23/2022 [phone]

## 2022-08-24 ENCOUNTER — Other Ambulatory Visit (HOSPITAL_BASED_OUTPATIENT_CLINIC_OR_DEPARTMENT_OTHER): Payer: Self-pay

## 2022-08-24 ENCOUNTER — Telehealth (HOSPITAL_BASED_OUTPATIENT_CLINIC_OR_DEPARTMENT_OTHER): Payer: Self-pay | Admitting: Neurological Surgery

## 2022-08-24 ENCOUNTER — Telehealth (INDEPENDENT_AMBULATORY_CARE_PROVIDER_SITE_OTHER): Payer: No Typology Code available for payment source | Admitting: Neurological Surgery

## 2022-08-24 NOTE — Telephone Encounter (Signed)
RN received the following communication request from [place], [place] regarding DC instructions for medications:        RN called pt, relayed all of the above. Reviewed refill policy and process as pt was admitted via ED for surgery and RN did not have discussion prior to surgery w/pt. RN also sent mychart message as discussed w/pt for contact numbers and recap of discussion.     Plan: Pt to be scheduled for 2 wk f/up appt. Message has been sent to [place], [facility] already.

## 2022-08-25 ENCOUNTER — Encounter (HOSPITAL_COMMUNITY): Payer: Self-pay

## 2022-08-25 ENCOUNTER — Inpatient Hospital Stay
Admission: EM | Admit: 2022-08-25 | Discharge: 2022-08-31 | DRG: 519 | Disposition: A | Discharge status: Home / Self Care | Payer: No Typology Code available for payment source | Attending: Neurological Surgery | Admitting: Neurological Surgery

## 2022-08-25 ENCOUNTER — Telehealth (HOSPITAL_BASED_OUTPATIENT_CLINIC_OR_DEPARTMENT_OTHER): Payer: Self-pay | Admitting: Neurological Surgery

## 2022-08-25 ENCOUNTER — Emergency Department (HOSPITAL_COMMUNITY): Payer: No Typology Code available for payment source

## 2022-08-25 ENCOUNTER — Inpatient Hospital Stay (HOSPITAL_COMMUNITY): Payer: No Typology Code available for payment source | Admitting: Neurological Surgery

## 2022-08-25 ENCOUNTER — Inpatient Hospital Stay (HOSPITAL_COMMUNITY): Payer: No Typology Code available for payment source

## 2022-08-25 ENCOUNTER — Other Ambulatory Visit: Payer: Self-pay

## 2022-08-25 DIAGNOSIS — M48061 Spinal stenosis, lumbar region without neurogenic claudication: Secondary | ICD-10-CM | POA: Diagnosis present

## 2022-08-25 DIAGNOSIS — Z9889 Other specified postprocedural states: Secondary | ICD-10-CM

## 2022-08-25 DIAGNOSIS — Z1152 Encounter for screening for COVID-19: Secondary | ICD-10-CM

## 2022-08-25 DIAGNOSIS — D849 Immunodeficiency, unspecified: Secondary | ICD-10-CM | POA: Diagnosis present

## 2022-08-25 DIAGNOSIS — R200 Anesthesia of skin: Secondary | ICD-10-CM

## 2022-08-25 DIAGNOSIS — Z96653 Presence of artificial knee joint, bilateral: Secondary | ICD-10-CM | POA: Diagnosis present

## 2022-08-25 DIAGNOSIS — Z87891 Personal history of nicotine dependence: Secondary | ICD-10-CM

## 2022-08-25 DIAGNOSIS — R29898 Other symptoms and signs involving the musculoskeletal system: Secondary | ICD-10-CM | POA: Diagnosis present

## 2022-08-25 DIAGNOSIS — M4804 Spinal stenosis, thoracic region: Secondary | ICD-10-CM | POA: Diagnosis present

## 2022-08-25 DIAGNOSIS — M4802 Spinal stenosis, cervical region: Principal | ICD-10-CM | POA: Diagnosis present

## 2022-08-25 DIAGNOSIS — M1711 Unilateral primary osteoarthritis, right knee: Secondary | ICD-10-CM | POA: Diagnosis present

## 2022-08-25 DIAGNOSIS — M1611 Unilateral primary osteoarthritis, right hip: Secondary | ICD-10-CM | POA: Diagnosis present

## 2022-08-25 DIAGNOSIS — M4800 Spinal stenosis, site unspecified: Secondary | ICD-10-CM

## 2022-08-25 DIAGNOSIS — J45909 Unspecified asthma, uncomplicated: Secondary | ICD-10-CM | POA: Diagnosis present

## 2022-08-25 DIAGNOSIS — M069 Rheumatoid arthritis, unspecified: Secondary | ICD-10-CM

## 2022-08-25 DIAGNOSIS — R202 Paresthesia of skin: Secondary | ICD-10-CM

## 2022-08-25 DIAGNOSIS — R531 Weakness: Secondary | ICD-10-CM

## 2022-08-25 DIAGNOSIS — R69 Illness, unspecified: Secondary | ICD-10-CM

## 2022-08-25 DIAGNOSIS — G992 Myelopathy in diseases classified elsewhere: Secondary | ICD-10-CM | POA: Diagnosis present

## 2022-08-25 DIAGNOSIS — M0579 Rheumatoid arthritis with rheumatoid factor of multiple sites without organ or systems involvement: Secondary | ICD-10-CM | POA: Diagnosis present

## 2022-08-25 LAB — RAPID FLU A & B,  RSV, SARS-COV-2 PCR
COVID-19 Coronavirus Qual PCR Result: NOT DETECTED
Rapid Influenza A PCR Result: NOT DETECTED
Rapid Influenza B PCR Result: NOT DETECTED
Rapid RSV PCR Result: NOT DETECTED

## 2022-08-25 LAB — CBC, DIFF
% Basophils: 1 %
% Eosinophils: 3 %
% Immature Granulocytes: 0 %
% Lymphocytes: 27 %
% Monocytes: 11 %
% Neutrophils: 58 %
% Nucleated RBC: 0 %
Absolute Eosinophil Count: 0.17 10*3/uL (ref 0.00–0.50)
Absolute Lymphocyte Count: 1.47 10*3/uL (ref 1.00–4.80)
Basophils: 0.03 10*3/uL (ref 0.00–0.20)
Hematocrit: 40 % (ref 36.0–45.0)
Hemoglobin: 12.6 g/dL (ref 11.5–15.5)
Immature Granulocytes: 0.01 10*3/uL (ref 0.00–0.05)
MCH: 31.4 pg (ref 27.3–33.6)
MCHC: 31.5 g/dL — ABNORMAL LOW (ref 32.2–36.5)
MCV: 100 fL — ABNORMAL HIGH (ref 81–98)
Monocytes: 0.63 10*3/uL (ref 0.00–0.80)
Neutrophils: 3.24 10*3/uL (ref 1.80–7.00)
Nucleated RBC: 0 10*3/uL
Platelet Count: 208 10*3/uL (ref 150–400)
RBC: 4.01 10*6/uL (ref 3.80–5.00)
RDW-CV: 13.2 % (ref 11.0–14.5)
WBC: 5.55 10*3/uL (ref 4.3–10.0)

## 2022-08-25 LAB — COMPREHENSIVE METABOLIC PANEL
ALT (GPT): 13 U/L (ref 7–33)
AST (GOT): 22 U/L (ref 9–38)
Albumin: 3.6 g/dL (ref 3.5–5.2)
Alkaline Phosphatase (Total): 59 U/L (ref 34–121)
Anion Gap: 9 (ref 4–12)
Bilirubin (Total): 0.4 mg/dL (ref 0.2–1.3)
Calcium: 9.3 mg/dL (ref 8.9–10.2)
Carbon Dioxide, Total: 26 meq/L (ref 22–32)
Chloride: 104 meq/L (ref 98–108)
Creatinine: 0.44 mg/dL (ref 0.38–1.02)
Glucose: 75 mg/dL (ref 62–125)
Potassium: 4.2 meq/L (ref 3.6–5.2)
Protein (Total): 6.5 g/dL (ref 6.0–8.2)
Sodium: 139 meq/L (ref 135–145)
Urea Nitrogen: 9 mg/dL (ref 8–21)
eGFR by CKD-EPI 2021: >60 mL/min/{1.73_m2} (ref >59)

## 2022-08-25 LAB — MAGNESIUM: Magnesium: 1.9 mg/dL (ref 1.8–2.4)

## 2022-08-25 LAB — PATHOLOGY, SURGICAL
Final Diagnosis: REACTIVE
Microscopic Description: REACTIVE

## 2022-08-25 LAB — PHOSPHATE: Phosphate: 3.5 mg/dL (ref 2.5–4.5)

## 2022-08-25 LAB — LAB ADD ON ORDER

## 2022-08-25 LAB — PROTHROMBIN & PTT
Partial Thromboplastin Time: 29 s (ref 22–35)
Prothrombin INR: 1 (ref 0.8–1.3)
Prothrombin Time Patient: 13 s (ref 10.7–15.6)

## 2022-08-25 MED ORDER — GABAPENTIN 300 MG OR CAPS
300.0000 mg | ORAL_CAPSULE | Freq: Once | ORAL | Status: AC
Start: 2022-08-25 — End: 2022-08-25
  Administered 2022-08-25: 300 mg via ORAL
  Filled 2022-08-25: qty 1

## 2022-08-25 MED ORDER — GADOTERIDOL 279.3 MG/ML IV SOLN
17.0000 mL | Freq: Once | INTRAVENOUS | Status: AC | PRN
Start: 2022-08-25 — End: 2022-08-25
  Administered 2022-08-25: 8.5 mmol via INTRAVENOUS

## 2022-08-25 MED ORDER — LIDOCAINE 4 % EX PTCH
2.0000 | MEDICATED_PATCH | Freq: Once | CUTANEOUS | Status: DC
Start: 2022-08-25 — End: 2022-08-31
  Filled 2022-08-25: qty 2

## 2022-08-25 MED ORDER — OXYCODONE HCL 5 MG OR TABS
5.0000 mg | ORAL_TABLET | Freq: Once | ORAL | Status: AC
Start: 2022-08-25 — End: 2022-08-25
  Administered 2022-08-25: 5 mg via ORAL
  Filled 2022-08-25: qty 1

## 2022-08-25 MED ORDER — ACETAMINOPHEN 500 MG OR TABS
1000.0000 mg | ORAL_TABLET | Freq: Once | ORAL | Status: AC
Start: 2022-08-25 — End: 2022-08-25
  Administered 2022-08-25: 1000 mg via ORAL
  Filled 2022-08-25: qty 2

## 2022-08-25 MED ORDER — DIPHENHYDRAMINE HCL 25 MG OR CAPS
25.0000 mg | ORAL_CAPSULE | Freq: Once | ORAL | Status: AC
Start: 2022-08-25 — End: 2022-08-25
  Administered 2022-08-25: 25 mg via ORAL
  Filled 2022-08-25: qty 1

## 2022-08-25 MED ORDER — DEXAMETHASONE 4 MG OR TABS
4.0000 mg | ORAL_TABLET | Freq: Four times a day (QID) | ORAL | Status: DC
Start: 2022-08-25 — End: 2022-08-28
  Administered 2022-08-25 – 2022-08-28 (×8): 4 mg via ORAL
  Filled 2022-08-25 (×9): qty 1

## 2022-08-25 NOTE — ED Triage Notes (Signed)
BIB [facility] for s/p emergent neuro surgery left ACDF and C7-T1 on 11/3. Pt endorsing bilateral LE weakness, numbness tingling and worsening bowel and bladder incontinence and reports "unable to bear weight on both my legs this AM. Pt is A&Ox4, c/o 8/10 upper back pain and 6/10 lower back pain. Denies any CP, dizziness, SOB or fevers.

## 2022-08-25 NOTE — ED Provider Notes (Signed)
CHIEF COMPLAINT   Chief Complaint   Patient presents with   . Post-Op Problem            HISTORY OF PRESENT ILLNESS AND REVIEW OF SYSTEMS      [phone] F with hx of RA, spinal canal stenosis, s/p emergent L ACDF C7-T1 on 11/03 presenting with post-op LE weakness. For two weeks, has had difficulty walking; numbness and tingling below the belly button and b/l LE weakness. After surgery, c/w abnl sensation but improved weakness. Last night, felt LE weakness and had difficulty walking to the bathroom and needed assistance by spouse. Denies urinary or fecal incontinence, last BM at 1100. Denies saddle anesthesia, f/c, n/v. Back pain improved with oxycodone, apap, gaba.     11/07 Review of Systems completed and negative except as stated above in the HPI (Systems reviewed: HENT, Eyes, Resp, CV, GI, GU, MSK, Skin, Neuro, Psych)           PAST MEDICAL AND SURGICAL HISTORY   Past Medical History:   Diagnosis Date   . Chronic right hip pain 02/26/2019   . Allergic asthma    . Fracture     Bilateral elbow fx   . Genital herpes    . Rheumatoid arthritis ([place])    . Rheumatoid arthritis ([place])    . Spinal stenosis    . 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 foot Bilateral     Arch surgery   . KNEE ARTHROPLASTY      LEFT 2018 REPAIR 2019   . KNEE ARTHROPLASTY Left 05/01/2020   . PR ADENOIDECTOMY PRIMARY <AGE 24      AGE 77  T/A    . PR ANES; TOTAL KNEE REPLACEMENT Left 04/01/2020   . PR TONSILLECTOMY ONE-HALF <AGE 24     . PR UNLISTED PROCEDURE FEMUR/KNEE Bilateral     TKA   . PR UNLISTED PROCEDURE FEMUR/KNEE      Left revision TKA    . PR UNLISTED PROCEDURE FOOT/TOES Bilateral 2009 R,  2010 L    Reconstructive surgery   . PR UNLISTED PROCEDURE PELVIS/HIP JOINT      DOS 07/30/20 R THA   . TKA Left 2019    revision from prior TKA          MEDICATIONS AND ALLERGIES     OUTPATIENT MEDICATIONS:   Current Outpatient Medications   Medication Instructions   .  acetaminophen (TYLENOL) 1,000 mg, Oral, Every 6 hours PRN   . certolizumab (Cimzia Starter Kit) 6 X 200 MG/ML prefilled syringe kit Inject 2 syringes (400 mg) under the skin initially, and at weeks 2 and 4 followed by 200 mg every other week   . Cimzia 200 mg, Subcutaneous, Every 2 weeks   . Fexofenadine HCl (ALLEGRA OR) 1 tablet, Daily   . gabapentin 300 MG capsule Take 1 capsule (300 mg) by mouth in the morning, 1 capsule (300 mg) in the afternoon, and 2 capsules (600 mg) before bedtime as directed.   . leflunomide 20 MG tablet TAKE 1 TABLET DAILY   . mupirocin 2 % ointment 1 application , Topical, Daily, Apply to nose.   . ondansetron (ZOFRAN) 4 mg, Oral, Every 8 hours PRN   . oxyCODONE 5 MG tablet Take 1 to 1 and 1/2  tablets (5 to 7.5 mg) by mouth every 4 hours as needed for moderate  pain or severe pain for up to 7 days.   . polyethylene glycol 3350 17 GM/SCOOP oral powder Fill cap with powder to the 17 gram mark and dissolve in 4 to 8 ounces of water. Take 17 g by mouth daily.   . psyllium 58.12 % packet 1 packet, Oral, Daily PRN, Mix in 8 oz of juice or water and drink.   . Senna-Time 17.2 mg, Oral, 2 times daily   . valACYclovir (VALTREX) 2,000 mg, Oral, Every morning       ALLERGIES:   Fentanyl, Hydromorphone, Remicade [infliximab], Adhesives, Codeine, and Nortriptyline              SOCIAL HISTORY AND FAMILY HISTORY   Social History     Tobacco Use   . Smoking status: Former     Packs/day: 1.00     Years: 20.00     Additional pack years: 0.00     Total pack years: 20.00     Types: Cigarettes     Quit date: 2015     Years since quitting: 8.8   . Smokeless tobacco: Never   . Tobacco comments:     hx : Off and on smoker but had vape , quit 2018.   Substance Use Topics   . Alcohol use: Not Currently     Comment: remote history of alcohol dependence   . Drug use: Never       Family History       Problem (# of Occurrences) Relation (Name,Age of Onset)    Cancer (1) Father    Heart Attack (1) Father    Rheumatoid  Arthritis (1) Other                  PHYSICAL EXAM   ED VITALS:  Vitals (Arrival)      T: 36.2 C (08/25/22 1328)  BP: (!) 106/48 (08/25/22 1328)  HR: 71 (08/25/22 1328)  RR: 16 (08/25/22 1328)  SpO2: 94 % (08/25/22 1328)     Vitals (Most recent in last 24 hrs)   T: 36.1 C (08/25/22 1830)  BP: 133/89 (08/25/22 1900)  HR: 85 (08/25/22 1900)  RR: 16 (08/25/22 1900)  SpO2: 96 % (08/25/22 1900) Room air  T range: Temp  Min: 35.8 C  Max: 36.2 C  Wt 170 lb (77.111 kg)     Ht _0  (1.549 m)     Body mass index is 32.12 kg/m.       Physical Exam  Vitals reviewed.   Constitutional:       Appearance: Normal appearance.   HENT:      Head: Normocephalic.      Mouth/Throat:      Mouth: Mucous membranes are moist.      Pharynx: Oropharynx is clear.   Eyes:      Extraocular Movements: Extraocular movements intact.      Conjunctiva/sclera: Conjunctivae normal.      Pupils: Pupils are equal, round, and reactive to light.   Neck:      Musculoskeletal: Normal range of motion and neck supple.   Cardiovascular:      Rate and Rhythm: Normal rate and regular rhythm.      Pulses: Normal pulses.      Heart sounds: Normal heart sounds.   Pulmonary:      Effort: Pulmonary effort is normal.      Breath sounds: Normal breath sounds.   Abdominal:      General: Abdomen is flat. Bowel sounds are  normal.      Palpations: Abdomen is soft.   Musculoskeletal:         General: Deformity present.      Cervical back: Normal range of motion and neck supple.   Skin:     General: Skin is warm.      Capillary Refill: Capillary refill takes less than 2 seconds.   Neurological:      General: No focal deficit present.      Mental Status: She is alert and oriented to person, place, and time.      Cranial Nerves: Cranial nerves are intact.      Sensory: Sensation is intact.      Motor: Motor function is intact.      Coordination: Coordination is intact.      Deep Tendon Reflexes: Reflexes are normal and symmetric.      Comments: Motor grossly intact  throughout.   Psychiatric:         Behavior: Behavior normal.               LABORATORY:   Labs Reviewed   CBC, DIFF - Abnormal       Result Value    WBC 5.55      RBC 4.01      Hemoglobin 12.6      Hematocrit 40      MCV 100 (*)     MCH 31.4      MCHC 31.5 (*)     Platelet Count 208      RDW-CV 13.2      % Neutrophils 58      % Lymphocytes 27      % Monocytes 11      % Eosinophils 3      % Basophils 1      % Immature Granulocytes 0      Neutrophils 3.24      Absolute Lymphocyte Count 1.47      Monocytes 0.63      Absolute Eosinophil Count 0.17      Basophils 0.03      Immature Granulocytes 0.01      Nucleated RBC 0.00      % Nucleated RBC 0     COMPREHENSIVE METABOLIC PANEL    Sodium 347      Potassium 4.2      Chloride 104      Carbon Dioxide, Total 26      Anion Gap 9      Glucose 75      Urea Nitrogen 9      Creatinine 0.44      Protein (Total) 6.5      Albumin 3.6      Bilirubin (Total) 0.4      Calcium 9.3      AST (GOT) 22      Alkaline Phosphatase (Total) 59      ALT (GPT) 13      eGFR by CKD-EPI 2021 >60     PROTHROMBIN & PTT    Prothrombin Time Patient 13.0      Prothrombin INR 1.0      Partial Thromboplastin Time 29      Partial Thromboplastin X Mean        Value: To calculate the PTT X Mean divide PTT value by 29.   LAB ADD ON ORDER    Lab Test Requested Magnesium, phosphate      Specimen Type/Description Blood      Sample To Use Most recent  Test Request Status Order Processed     MAGNESIUM    Magnesium 1.9     PHOSPHATE    Phosphate 3.5     RAPID FLU A & B,  RSV, SARS-COV-2 PCR    Rapid Flu A,B,RSV,SARS-CoV-2 PCR Spec. Desc. Nasopharyngeal Swab      Rapid Influenza A PCR Result        Rapid Influenza B PCR Result        Rapid RSV PCR Result        COVID-19 Coronavirus Qual PCR Result        COVID-19 Coronavirus Qual PCR Interpretation        COVID-19 Qualitative PCR Indication Symptomatic           IMAGING:     ED Wet Read -   CT C Spine wo Contrast   Final Result   1. No acute osseous abnormality.       MRI C-T-L Spine w wo Contrast    (Results Pending)   XR C Spine 4-5 View    (Results Pending)       Radiology Final Result -   No image results found.              EKG DOCUMENTATION                 SUICIDE RISK EVALUATION             SEPSIS               ED COURSE/MEDICAL DECISION MAKING      56yo F with hx of RA, spinal canal stenosis, s/p emergent L ACDF C7-T1 on 11/03 presenting with post-op LE weakness. Upon presentation, has difficulty ambulating but otherwise reassuring vitals. On exam, we--appearing, unremarkable physical exam except rash around surgical site, and no focal deficits appreciated on neuro exam.       Differential includes but is not limited to chronic spinal stenosis, post-op complications such as abscess, bleeding, infection.      Work-up included a CBC, CMP, coags, CT c-spine, MRI C-T-L.     Please see ED course below for a timeline of patient's emergency department stay including interpretation of results, updated history, and subsequent interactions with the patient, and discussions with independent historians or consultants.     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  Admitted to neurosurgery for further workup and management.         ED Course as of 08/25/22 2240   Tue Aug 25, 2022   1323 Seen by neurosurg, who'll order MRI C-T-L w/ contrast and CT C-spine w/o [JE]   1454 Discussed with neurosurg again and will determine admission after CT and MRI result [JE]   1650 CT C Spine wo Contrast  No acute osseous abnormality. Pending MRI for decision on admission [JE]   1656 Ordered oxy, apap, gabapentin for back pain [JE]   2226 MRI C-T-L Spine w wo Contrast  Discussed with neurorad. Continues to have spinal stenosis with cord compression and edema C7-T1. Severe degen in lumbar spine. Questionable enhancement in the cauda equina nerve roots, can be seen post-op [JE]   2239 I received signout on this patient. 56 year old female  presenting with lower extremity weakness in setting of previous spinal surgery, she is currently admitted to neurosurgery. NPO currently, nothing to do. [AA]      ED Course User Index  [AA] York Grice, MD  [  JE] Rich Brave, MD             External records review : Discharge summary                     Patient care and management discussed with : Consultant: neurosurgery               Medications Given in the ED:   Medications   lidocaine (Lidoderm) 4 % per patch 2 patch (has no administration in time range)   dexAMETHasone (Decadron) tablet 4 mg (4 mg Oral Given 08/25/22 2217)   acetaminophen (Tylenol) tablet 1,000 mg (has no administration in time range)   diphenhydrAMINE (Benadryl) capsule 25 mg (25 mg Oral Given 08/25/22 1356)   oxyCODONE tablet 5 mg (5 mg Oral Given 08/25/22 1730)   acetaminophen (Tylenol) tablet 1,000 mg (1,000 mg Oral Given 08/25/22 1730)   gabapentin (Neurontin) capsule 300 mg (300 mg Oral Given 08/25/22 1730)   gadoteridol (Prohance) 279.3 MG/ML contrast injection 8.5 mmol (8.5 mmol Intravenous Contrast Given 08/25/22 2104)              CLINICAL IMPRESSION AND DISPOSITION (Link)     Clinical Impressions:   [O32.919] Status post cervical discectomy        No follow-up provider specified.    Patient was given scripts for the following medications.  New Prescriptions    No medications on file          Disposition: Admit        CRITICAL CARE/ADDITIONAL INFORMATION REVIEWED ATTENDING ONLY                 Rich Brave, MD  Resident  08/25/22 2157       Rich Brave, MD  Resident  08/25/22 2230

## 2022-08-25 NOTE — Consults (Signed)
Consult Note     Autumn Camacho") - DOB: [birth year] (56 year old female)  Gender Identity: Female  Preferred Pronouns: she/her/hers  Admit Date: 08/25/2022  Code Status: Needs Review         Consults    CHIEF CONCERN / IDENTIFICATION:     Autumn Camacho is a 56 year old female with RA who is s/p C7-T1 ACDF 11/3 who presents for worsening BLE weakness/numbness/tingling along since last night.      SUBJECTIVE   HISTORY OF PRESENT ILLNESS:   This is a 56 year old female who had acute BLE weakness/numbness/tingling along with urinary/stool incontinence 11/2 and was taken for emergent C7-T1 ACDF 11/3 given concern for cauda equina. She did well post op and had improvement in her strength. Discharged 11/5 in stable condition. Since yesterday she has had acute worsening BLE weakness/numbness/tingling. Feels like her legs are giving out on her when she ambulates. Notes some trouble with pain/temp sensation in BLE. No urinary/fecal incontinence. No saddle anesthesia. No fevers/chills. No nausea/vomiting.             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    Osteoarthritis of right hip    Osteoarthritis of right knee    Knee dislocation, left, initial encounter    Failed total left knee replacement (HCC)    Chronic instability of left knee    Failed total knee replacement, subsequent encounter    Status post revision of total replacement of left knee    Chronic right-sided low back pain with sciatica    Degenerative disc disease, lumbar    Cervical stenosis of spinal canal       Past Surgical History:   Procedure Laterality Date    foot Bilateral     Arch surgery    KNEE ARTHROPLASTY      LEFT 2018 REPAIR 2019    KNEE  ARTHROPLASTY Left 05/01/2020    PR ADENOIDECTOMY PRIMARY <AGE 51      AGE 39  T/A     PR ANES; TOTAL KNEE REPLACEMENT Left 04/01/2020    PR TONSILLECTOMY ONE-HALF <AGE 51      PR UNLISTED PROCEDURE FEMUR/KNEE Bilateral     TKA    PR UNLISTED PROCEDURE FEMUR/KNEE      Left revision TKA     PR UNLISTED PROCEDURE FOOT/TOES Bilateral 2009 R,  2010 L    Reconstructive surgery    PR UNLISTED PROCEDURE PELVIS/HIP JOINT      DOS 07/30/20 R THA    TKA Left 2019    revision from prior TKA       Social History     Tobacco Use    Smoking status: Former     Packs/day: 1.00     Years: 20.00     Additional pack years: 0.00     Total pack years: 20.00     Types: Cigarettes     Quit date: 2015     Years since quitting: 8.8    Smokeless tobacco: Never    Tobacco comments:     hx : Off and on smoker but had vape , quit 2018.   Substance and  Sexual Activity    Alcohol use: Not Currently     Comment: remote history of alcohol dependence    Drug use: Never   Social History Narrative    She moved to [place] from [place] in 04/2018.        Family History   Problem Relation Age of Onset    Cancer Father     Heart Attack Father     Rheumatoid Arthritis Other           MEDICATIONS:   Current Outpatient Medications   Medication Instructions    acetaminophen (TYLENOL) 1,000 mg, Oral, Every 6 hours PRN    certolizumab (Cimzia Starter Kit) 6 X 200 MG/ML prefilled syringe kit Inject 2 syringes (400 mg) under the skin initially, and at weeks 2 and 4 followed by 200 mg every other week    Cimzia 200 mg, Subcutaneous, Every 2 weeks    Fexofenadine HCl (ALLEGRA OR) 1 tablet, Daily    gabapentin 300 MG capsule Take 1 capsule (300 mg) by mouth in the morning, 1 capsule (300 mg) in the afternoon, and 2 capsules (600 mg) before bedtime as directed.    leflunomide 20 MG tablet TAKE 1 TABLET DAILY    mupirocin 2 % ointment 1 application , Topical, Daily, Apply to nose.    ondansetron (ZOFRAN) 4 mg, Oral, Every 8 hours PRN    oxyCODONE 5 MG tablet  Take 1 to 1 and 1/2  tablets (5 to 7.5 mg) by mouth every 4 hours as needed for moderate pain or severe pain for up to 7 days.    polyethylene glycol 3350 17 GM/SCOOP oral powder Fill cap with powder to the 17 gram mark and dissolve in 4 to 8 ounces of water. Take 17 g by mouth daily.    psyllium 58.12 % packet 1 packet, Oral, Daily PRN, Mix in 8 oz of juice or water and drink.    Senna-Time 17.2 mg, Oral, 2 times daily    valACYclovir (VALTREX) 2,000 mg, Oral, Every morning       ALLERGIES:   Fentanyl, Hydromorphone, Remicade [infliximab], Adhesives, Codeine, and Nortriptyline     OBJECTIVE        T: 36.2 C (08/25/22 1328)  BP: (!) 106/48 (08/25/22 1328)  HR: 71 (08/25/22 1328)  RR: 16 (08/25/22 1328)  SpO2: 94 % (08/25/22 1328)     Vitals (Most recent in last 24 hrs)   T: (!) 35.8 C (08/25/22 1423)  BP: 123/81 (08/25/22 1530)  HR: 71 (08/25/22 1530)  RR: 16 (08/25/22 1530)  SpO2: 94 % (08/25/22 1530) Room air  T range: Temp  Min: 35.8 C  Max: 36.2 C  Wt 170 lb (77.111 kg)     Ht _0  (1.549 m)     Body mass index is 32.12 kg/m.     PHYSICAL EXAM:   Exam:   A&Ox3  PERRL EOMI  FS    R L  UPPER  D  5 5  B (C5-6) 5 5  T (C7-8) 5 5  WE (C6) 5 5  FDP (C8) 5 5  ADM (T1) 4* 4* limited by RA  ---  LOWER  IP (L2-3) 5 5  Q (L4)  5 5  TA (L4-5) 5 5  G (S1)  5 5  EHL (L5) 5 5     Reflexes 2+ throughout  No clonus, hoffmans, toes down for babinski  Sensation grossly intact throughout  Rectal tone intact  Labs (last 24 hours):   Chemistries  CBC  LFT  Gases, other   139 104 9 75   12.6   AST: 22 ALT: 13  -/-/-/-  -/-/-/-   4.2 26 0.44   5.55 >< 208  AP: 71 T bili: 0.4  Lact (a): - Lact (v): -   eGFR: >60 Ca: 9.3   40   Prot: 6.5 Alb: 3.6  Trop I: - D-dimer: -   Mg: - PO4: -  ANC: 3.24     BNP: - Anti-Xa: -     ALC: 1.47    INR: 1.0        IMAGING:   I have reviewed the latest radiology results  MRI C-T-L Spine w wo Contrast    (Results Pending)   CT C Spine wo Contrast    (Results Pending)              ASSESSMENT/PLAN       Autumn Camacho is a 56 year old female with RA who is s/p C7-T1 ACDF 11/3 who presents for worsening BLE weakness/numbness/tingling along since last night. Neuro exam intact without signs of myelopathy at this time. Given pain/temp changes along with disturbance in gait with her legs giving out, plan for:  - MRI full spine with and without contrast  - CT cervical spine to assess hardware  - basic labs, coags

## 2022-08-25 NOTE — Telephone Encounter (Addendum)
Hx: 56 yo female "underwent L ACDF C7 to T1 w Dr. Reesa Chew on 08/21/22. Surgery went well, no complications, pt was monitored and remained neurologically stable" (per DC note)    Next OV: 09/03/2022 w/Anderson, ARNP    Situation: Pt LVM on RN line endorsing "weakness" and unable to walk.    RN returned call. Pt endorsing sudden return of weakness to BLE since being Dc'd. Pt notes BLE feel increasingly more numb and weak, stating BLE "unable to support me" and noting spouse had ot hold pt up to avoid falling. Sudden weakness started 08/24/2022 evening when getting OOB to use BR, weakness is ongoing and not improved as of this AM. Weakness is occurring bilaterally, R>L. Legs are "giving out" while walking stating "incapable of walking on my own." Pt states weakness is not related to pain. Pt notes weakness and fall brought pt in to hospital where she underwent emergent surgery. Weakness and balanced has improved after surgery, however returned more severe. Denies fall/injury leading to onset of weakness. Reports ongoing saddle anesthesia prior to surgery, slightly improved post op. Pt had incontinence prior to surgeery, improved postop, currently reporting urinary urgency and unable to make to BR in time - feels urge to urinate.     Plan: Pt tearful and notes concern. RN educated on emergent symptoms and explained that pt may need to be seen in ER d/t symptoms, will confirm w/team for best action. RN educated pt to call 911 or seek emergent care at any time for concerns while RN checking w/APP. In the mean time, RN will talk directly w/APP for recommendations and routing high alert. Pt to seek emergent care         Update: RN spoke in person w/Paredes, ARNP and provided brief overview of pt symptoms. Paredes, ARNP in agreement w/RN's recommendation for ED. RN spoke w/pt advised that pt present to [facility] ED. Pt in agreement, plans to call 911 for ambulance transportation noting unable to get to/in car d/t weakness. RN then  called [facility] charge nurse, spoke w/Mike, RN provided report on symptoms and anticipated arrival.       Pt seeking emergent care d/t symptoms - will present to [facility] ED via ambulance.

## 2022-08-26 DIAGNOSIS — R2689 Other abnormalities of gait and mobility: Secondary | ICD-10-CM

## 2022-08-26 DIAGNOSIS — R208 Other disturbances of skin sensation: Secondary | ICD-10-CM

## 2022-08-26 LAB — BASIC METABOLIC PANEL
Anion Gap: 9 (ref 4–12)
Calcium: 9.2 mg/dL (ref 8.9–10.2)
Carbon Dioxide, Total: 24 meq/L (ref 22–32)
Chloride: 107 meq/L (ref 98–108)
Creatinine: 0.39 mg/dL (ref 0.38–1.02)
Glucose: 104 mg/dL (ref 62–125)
Potassium: 4.2 meq/L (ref 3.6–5.2)
Sodium: 140 meq/L (ref 135–145)
Urea Nitrogen: 9 mg/dL (ref 8–21)
eGFR by CKD-EPI 2021: >60 mL/min/{1.73_m2} (ref >59)

## 2022-08-26 LAB — FOLATE & VITAMIN B12
Folate, SRM: 17.9 ng/mL (ref >5.8)
Vitamin B12 (Cobalamin): 443 pg/mL (ref 180–914)

## 2022-08-26 LAB — CBC (HEMOGRAM)
Hematocrit: 42 % (ref 36.0–45.0)
Hemoglobin: 13.2 g/dL (ref 11.5–15.5)
MCH: 31.5 pg (ref 27.3–33.6)
MCHC: 31.4 g/dL — ABNORMAL LOW (ref 32.2–36.5)
MCV: 100 fL — ABNORMAL HIGH (ref 81–98)
Platelet Count: 207 10*3/uL (ref 150–400)
RBC: 4.19 10*6/uL (ref 3.80–5.00)
RDW-CV: 13.1 % (ref 11.0–14.5)
WBC: 6.21 10*3/uL (ref 4.3–10.0)

## 2022-08-26 LAB — COVID-19 CORONAVIRUS QUALITATIVE PCR: COVID-19 Coronavirus Qual PCR Result: NOT DETECTED

## 2022-08-26 LAB — HEMOGLOBIN A1C, HPLC: Hemoglobin A1C: 4.5 % (ref 4.0–6.0)

## 2022-08-26 MED ORDER — POLYETHYLENE GLYCOL 3350 17 G OR PACK
17.0000 g | PACK | Freq: Every day | ORAL | Status: DC | PRN
Start: 2022-08-26 — End: 2022-08-31

## 2022-08-26 MED ORDER — MELATONIN 3 MG OR TABS
3.0000 mg | ORAL_TABLET | Freq: Every evening | ORAL | Status: DC | PRN
Start: 2022-08-26 — End: 2022-08-31

## 2022-08-26 MED ORDER — ONDANSETRON HCL 4 MG OR TABS
4.0000 mg | ORAL_TABLET | Freq: Three times a day (TID) | ORAL | Status: DC | PRN
Start: 2022-08-26 — End: 2022-08-31

## 2022-08-26 MED ORDER — SENNOSIDES 8.6 MG OR TABS
17.2000 mg | ORAL_TABLET | Freq: Two times a day (BID) | ORAL | Status: DC | PRN
Start: 2022-08-26 — End: 2022-08-31
  Administered 2022-08-28 – 2022-08-30 (×4): 17.2 mg via ORAL
  Filled 2022-08-26 (×5): qty 2

## 2022-08-26 MED ORDER — ACETAMINOPHEN 325 MG OR TABS
650.0000 mg | ORAL_TABLET | ORAL | Status: DC | PRN
Start: 2022-08-26 — End: 2022-08-31
  Administered 2022-08-26 – 2022-08-31 (×23): 650 mg via ORAL
  Filled 2022-08-26 (×26): qty 2

## 2022-08-26 MED ORDER — ONDANSETRON HCL 4 MG/2ML IJ SOLN
4.0000 mg | Freq: Three times a day (TID) | INTRAMUSCULAR | Status: DC | PRN
Start: 2022-08-26 — End: 2022-08-31

## 2022-08-26 MED ORDER — OXYCODONE HCL 5 MG OR TABS
5.0000 mg | ORAL_TABLET | ORAL | Status: DC | PRN
Start: 2022-08-26 — End: 2022-08-31
  Administered 2022-08-26 – 2022-08-27 (×6): 5 mg via ORAL
  Administered 2022-08-27: 10 mg via ORAL
  Administered 2022-08-27: 5 mg via ORAL
  Administered 2022-08-27 – 2022-08-29 (×11): 10 mg via ORAL
  Administered 2022-08-30 – 2022-08-31 (×8): 5 mg via ORAL
  Filled 2022-08-26 (×5): qty 1
  Filled 2022-08-26: qty 2
  Filled 2022-08-26 (×2): qty 1
  Filled 2022-08-26: qty 2
  Filled 2022-08-26: qty 1
  Filled 2022-08-26 (×3): qty 2
  Filled 2022-08-26: qty 1
  Filled 2022-08-26: qty 2
  Filled 2022-08-26 (×2): qty 1
  Filled 2022-08-26 (×2): qty 2
  Filled 2022-08-26 (×2): qty 1
  Filled 2022-08-26 (×2): qty 2
  Filled 2022-08-26: qty 1
  Filled 2022-08-26 (×3): qty 2
  Filled 2022-08-26: qty 1

## 2022-08-26 MED ORDER — PSYLLIUM 58.12 % OR PACK
1.0000 | PACK | Freq: Two times a day (BID) | ORAL | Status: DC | PRN
Start: 2022-08-26 — End: 2022-08-31
  Administered 2022-08-26 – 2022-08-30 (×4): 1 via ORAL
  Filled 2022-08-26 (×4): qty 1

## 2022-08-26 NOTE — Nursing Note (Signed)
Patient Summary  Autumn Camacho 56 y/o female with RA who is s/p C7-T1 ACDF on 11/3 who presents for worsening BLE weakness/numbness/tingling along since last night.      Assumed care at 1100: VSS on RA. Aox4. Endorsing back pain, pain managed w/ prn tylenol and PRN oxy 5 mg given x1 this shift. BLE neuropathy. Continent of bowel and bladder. No BM this shift, passing gas.1-2PA pivot to commode. Surgical site on L side of anterior neck OTA w some redness. Reports minimal drainage at site. NPO at midnight .      Illness Severity  Stable

## 2022-08-26 NOTE — Nursing Note (Signed)
Patient Summary  Autumn Camacho is a 55 year old female with RA who is s/p C7-T1 ACDF 11/3 who presents for worsening BLE weakness/numbness/tingling along since last night.      VSS on RA. AOx4. Endorsing back pain, pain managed w/ prn tylenol and PRN oxy 5 mg. Baseline BLE neuropathy. 4/5 strength BL but endorses weakness when bearing weight. Voiding via BSC, AUOP. x1 formed BM this shift, passing gas. 1PA pivot to commode. Surgical site on L side of anterior neck OTA with redness. NPO at midnight.     Illness Severity  Stable

## 2022-08-26 NOTE — Nursing Note (Signed)
Patient Summary  Autumn Camacho is a 56 year old female with RA who is s/p C7-T1 ACDF 11/3 who presents for worsening BLE weakness/numbness/tingling along since last night.      Received from ED around 0100. VSS on RA. Endorsing back pain, pain managed w/ prn oxycodone. Endorsing pressure over bladder, relieved w/ voiding. No BM this shift, passing gas. 2 person assist pivot to commode. Surgical site on L side of anterior neck OTA. Reports minimal drainage at site. Family member at bedside when transferred to the floor. NPO for possible OR.     Illness Severity  Stable

## 2022-08-26 NOTE — Progress Notes (Signed)
[facility] NEUROLOGICAL SURGERY  Progress Note Date of Service: 08/26/22   Date of Admission: 08/25/2022  Hospital Day: 1  Post Op Date: * No surgery found *      IDENTIFICATION  56 year old female // [G] BLE wk [s/p C7-T1 ACDF 11/3 AA,RA]  Edited by: Irma Newness, MD at 08/25/2022 1303       INTERVAL EVENTS   NAEO     OBJECTIVE   EXAM     R L  UPPER  D  5 5  B (C5-6) 5 5  T (C7-8) 5 5  WE (C6) 5 5  FDP (C8) 5 5  ADM (T1) 5 5  ---  LOWER  IP (L2-3) 5 5  Q (L4)  5 5  TA (L4-5) 5 5  G (S1)  5 5  EHL (L5) 5 4  Continued numbness in the BLE     LABS  Lab Results   Component Value Date    SODIUM 140 08/26/2022    SODIUM 139 08/25/2022    WBC 6.21 08/26/2022    WBC 5.55 08/25/2022    HEMATOCRIT 42 08/26/2022    HEMATOCRIT 40 08/25/2022    PLATELET 207 08/26/2022    PLATELET 208 08/25/2022    INR 1.0 08/25/2022    INR 1.0 08/21/2022        ASSESSMENT & PLAN       NSG Plan    Floor for Q4 NC   Diet - regular  BP goals - normotension  Imaging: MRI full spine completed with upright XRs yesterday, reviewed  Brace- none  Steroids: Dexamethasone 4q6  VTE PPX:  SCDs only  PT/OT/SLP  Consults:    ETSS-: Endocrine consult + Polyuria PP  ENT  Neurology- possible neurology consult  Medicine  ID  Oncology  Suture removal: absorbable, none to remove  Dispo:  Pending

## 2022-08-26 NOTE — Consults (Signed)
Consult Note     Autumn Camacho Autumn Camacho") - DOB: 1966-03-21 (56 year old female)  Gender Identity: Female  Preferred Pronouns: she/her/hers  Admit Date: 08/25/2022  Code Status: Full Code         Inpatient Consult to Neurology  Consult performed by: Alen Blew, MD  Consult ordered by: Lucianne Lei Der Vlugt, Keane Police, MD  Reason for consult: Alternative causes for gait disturbance          CHIEF CONCERN / IDENTIFICATION:      Autumn Camacho is a 56 year old right-handed female with with PMH of gait difficulties and RA who is s/p C7-T1 ACDF on 11/3 with initial improvemet post op now with worsenign gait difficulties.       SUBJECTIVE   HISTORY OF PRESENT ILLNESS:   Autumn Camacho is a 56 year old woman with PMH of RA and chronic back pain. She reports that her back pain has been on going for many years. She was seen by pain medicine in August who tried steroid injections with initial improvement of her pain but ultimately with recurrence. Some time around the end of September she began to have weakness in the legs as well. She followed up with neurosurgery on 10/2 for evaluation regarding her pain and weakness who noted b/l leg weakness. Her MRI showed multi level areas degenerative changes and she was counseled on likely need for surgery.     On 11/2 she had a sudden worsening of her ability to walk and presented to ED at [facility] where an MRI showed C7-T1 changes with cord compression and signal changes. She was taken to surgery on 11/3 for diskectomy and cervical fusion. Following the surgery she was recovering well when she began to have more weakness returning to her per surgical level of function.     Neurology was consulted for consideration regarding alternative etiologies.             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    Osteoarthritis of right hip    Osteoarthritis of right knee    Knee dislocation, left, initial encounter    Failed total left knee replacement (HCC)    Chronic instability of left knee    Failed total knee replacement, subsequent encounter    Status post revision of total replacement of left knee    Chronic right-sided low back pain with sciatica    Degenerative disc disease, lumbar    Cervical stenosis of spinal canal    Weakness of both lower extremities       Past Surgical History:   Procedure Laterality Date    foot Bilateral     Arch surgery    KNEE ARTHROPLASTY      LEFT 2018 REPAIR 2019    KNEE ARTHROPLASTY Left 05/01/2020    PR ADENOIDECTOMY PRIMARY <AGE 31      AGE 56  T/A     PR ANES; TOTAL KNEE REPLACEMENT Left 04/01/2020    PR TONSILLECTOMY ONE-HALF <AGE 31      PR UNLISTED PROCEDURE FEMUR/KNEE Bilateral     TKA    PR UNLISTED PROCEDURE FEMUR/KNEE  Left revision TKA     PR UNLISTED PROCEDURE FOOT/TOES Bilateral 2009 R,  2010 L    Reconstructive surgery    PR UNLISTED PROCEDURE PELVIS/HIP JOINT      DOS 07/30/20 R THA    TKA Left 2019    revision from prior TKA       Social History     Tobacco Use    Smoking status: Former     Packs/day: 1.00     Years: 20.00     Additional pack years: 0.00     Total pack years: 20.00     Types: Cigarettes     Quit date: 2015     Years since quitting: 8.8    Smokeless tobacco: Never    Tobacco comments:     hx : Off and on smoker but had vape , quit 2018.   Substance and Sexual Activity    Alcohol use: Not Currently     Comment: remote history of alcohol dependence    Drug use: Never   Social History Narrative    She moved to [place] from [place] in 04/2018.        Family History   Problem Relation Age of Onset    Cancer Father     Heart Attack Father     Rheumatoid Arthritis Other           MEDICATIONS:   SCHEDULED MEDICATIONS:      dexAMETHasone, 4 mg, QID    lidocaine, 2 patch, Once    INFUSED MEDICATIONS:       PRN MEDICATIONS:    acetaminophen, 650 mg, q4h PRN    melatonin, 3 mg, q HS PRN    ondansetron, 4 mg, q8h PRN    ondansetron, 4 mg, q8h PRN    oxyCODONE, 5-10 mg, q3h PRN    polyethylene glycol 3350, 17 g, Daily PRN    psyllium, 1 packet, BID PRN    senna, 17.2 mg, BID PRN    ALLERGIES:   Fentanyl, Hydromorphone, Remicade [infliximab], Adhesives, Codeine, and Nortriptyline     OBJECTIVE        T: 36.2 C (08/25/22 1328)  BP: (!) 106/48 (08/25/22 1328)  HR: 71 (08/25/22 1328)  RR: 16 (08/25/22 1328)  SpO2: 94 % (08/25/22 1328) Room air   Vitals (Most recent in last 24 hrs)   T: 36.5 C (08/26/22 0723)  BP: 116/77 (08/26/22 0723)  HR: 82 (08/26/22 0723)  RR: 16 (08/26/22 0723)  SpO2: 94 % (08/26/22 0723) Room air  T range: Temp  Min: 35.8 C  Max: 36.6 C  Wt 180 lb 1.9 oz (81.7 kg)     Ht 5' .984" (1.549 m)     Body mass index is 34.05 kg/m.       Neurological Exam   Gen: Patient sitting up in NAD.  Mental status:  Awake, alert, and oriented to person, place, date, and reason for visit.  Follows commands.  Cranial nerves:  II: Full visual fields, optic discs not visualized on fundoscoptic exam  III/IV/VI: EOMI, PERRL  V: Facial sensation intact in V1/V2/V3 distribution b/l  VII: Facial expressions intact and symmetric.  VIII: Hearing grossly intact to finger rub b/l.  IX/X: Uvula midline.  XI: Strength 5/5 in shoulder elevation and neck rotation b/l.  XII: Tongue protrusion midline    Motor exam: Notable joint deformities from RA in b/l UE and LE  Upper Extremity R/L:  Deltoid: 5 / 5  Bicep: 5 /  5  Tricep: 5 / 5  Wrist Ext: 5 / 5  Wrist Flex: 5 / 5  Finger Ext: 5 / 5  Finger Flex: 5 / 5  IO: 5 / 5  ABP: 5 / 5    Lower Extremity R/L:  Hip Flex: 5 / 5  Knee Flex: 5 / 5  Knee Ext: 5 / 5  DF: 5 / 5  PF: 5 / 5    Sensation: decreased to pinprick up to shins, vibration absent in ankles present at shin, proprioception decreased in great  toes b/l     Reflexes:                       R    ___    L               2 I         I_'_'_I           I 2                  I_2-3__ I ___2-3__I                           2    I    2                                 I                          ___I___                         I              I                  2-3  I              I   2-3                    __ I 0       1 I__                   Up   Up   Right greater than left toe initially suggestive of triple flexion, but during distraction may be more consistent with withdrawal  Cerebellum: Normal FTN, RAMs. No nystagmus.  Gait: Able to stand bedside but legs give out when she tries to take a step.  Lifting her right foot      Labs (last 24 hours):   Chemistries  CBC  LFT  Gases, other   140 107 9 104   13.2   AST: 22 ALT: 13  -/-/-/-  -/-/-/-   4.2 24 0.39   6.21 >< 207  AP: 59 T bili: 0.4  Lact (a): - Lact (v): -   eGFR: >60 Ca: 9.2   42   Prot: 6.5 Alb: 3.6  Trop I: - D-dimer: -   Mg: 1.9 PO4: 3.5  ANC: 3.24     BNP: - Anti-Xa: -     ALC: 1.47    INR: 1.0        IMAGING:   I have reviewed the latest radiology results  XR C Spine 4-5 View   Final Result  by Marlena Clipper, MD (11/08 1543)      Status post C7-T1 intervertebral hardware placement. Suboptimal evaluation of alignment and dynamic instability at the cervicothoracic junction due to obscuration by overlying shoulder shadows.       Unchanged widening of the atlantodental interval and anterior subluxation of C1 on C2 in unit. Findings are more accentuated on flexion position (9 mm) compared to neutral or extension position (6-7 mm), suggestive of dynamic instability. Unchanged minimal grade 1 anterolisthesis are this is of C5 on C6.      The vertebral bodies are normal in height. Unchanged intervertebral disc space narrowing at C3-4 and C6-7 levels with associated endplate irregularity, marginal osteophytosis, and uncovertebral hypertrophy. Multilevel facet arthrosis. The prevertebral soft tissues are  unchanged.      I have personally reviewed the images and agree with the report (or as edited).      MRI C-T-L Spine w wo Contrast   Final Result by Margot Ables, MD (11/08 1538)   1.  Suggestion of focal enhancement of the descending cauda equina nerve roots at the level L2-3. 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   2.  Otherwise, expected postoperative changes related to C7-T1 anterior spinal fusion. There is persistent moderate to severe canal narrowing at C7-T1 with cord compression and edema in the left hemicord. No abnormal enhancement within the cervicothoracic cord or spinal canal.   3.  Similar, advanced degenerative changes in the lumbar spine, including severe left foraminal stenosis at L4-5 with compression of the exiting L4 nerve root.      Dr. Lilli Few conveyed the findings of this exam to Dr. Jasper Riling over the phone at time of interpretation.      Preliminary report by neuroradiology fellow; final report by the attending neuroradiologist will follow.            ATTENDING FINAL REPORT      FINDINGS/IMPRESSION (final):   I agree with the preliminary report with the following comment(s):    Signal alteration is noted at the C0-C1 articulation, posterior arch of C1-C2 (image 7 of series 401), and posterior elements at C4-5, which are nonspecific and presumably degenerative in etiology.       There is approximate 6 mm anterior displacement of the anterior arch of C1 relative to the dens (image 23 of series 1501) and similar anterior displacement of the posterior arch of C1 relative to the dens; this results in at least moderate to severe mass effect upon the upper cervical cord near the craniocervical junction (image 33 of series 1301).      There is suspected subtle enhancement versus artifact within the left hemicord, immediately inferior to the C7-T1 hardware (image 21 of series 1701), which at the level of signal  alteration within the left hemicord.      +ct+      I have personally reviewed the images and agree with the report (or as edited).      CT C Spine wo Contrast   Final Result by Patrice Paradise, MD (11/07 1638)   1. No acute osseous abnormality.                 ASSESSMENT/PLAN      Autumn Camacho is a 56 year old right-handed female with with PMH of gait difficulties and RA who is s/p C7-T1 ACDF on 11/3 with initial improvemet post op now with worsenign gait difficulties.  The patient's history is notable for slowly progressive weakness with an abrupt loss of ability to walk with initial improvement following surgery and again acute decline. Her exam is notable for sensory loss and decreased reflexes in the ankles but intact strength to confrontation throughout the leg. Her reflexes were brisk and 2+ at the patella b/l. 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 While we can not completely rule out other causes of myelopathy it would seem unlikely for her to have developed an acute myelopathy in addition to her compression. Still it may be of some value to rule out some causes including infectious and metabolic causes. Notably HIV and syphilis are easily testable and B12 and copper would be other reversible causes of myelopathy. Transverse myelitis is a consideration but would not be expected to improve with surgery and while she did receive some steroids prior to surgery they were not at treatment levels for TM.     Recommendations:  - B12, Folate, Copper  - HIV, Syphilis testing   - SPEP    Thank you for this consult. Neurology will continue to follow. This case was seen and discussed with neurology attending Dr. Rudolpho Sevin.

## 2022-08-26 NOTE — Nursing Note (Signed)
Illness Severity  Transfer      Patient Summary  Autumn Camacho is a 56 year old female with RA who is s/p C7-T1 ACDF 11/3 who presents for worsening BLE weakness/numbness/tingling along since last night.       0700-1030: VSS on RA. Aox4. Endorsing back pain, pain managed w/ prn tylenol. BLE neuropathy. Continent of bowel and bladder.  No BM this shift, passing gas. 2 person assist pivot to commode. Surgical site on L side of anterior neck   OTA. Reports minimal drainage at site. NPO for possible OR.               Action Items  - Q4H neuro checks  - Q4H VS      Situational Awareness  Report given and transferring to 6SE

## 2022-08-26 NOTE — Anesthesia Preprocedure Evaluation (Addendum)
Patient: Autumn Camacho  Procedure Information       Date/Time: 08/27/22 1249    Procedure: Cervical 7 laminectomy, additional levels as needed (Spine Cervical)    Location: [facility] / [facility]    Surgeons: Charlynn Court, MD            HPI: Autumn Camacho is a 56 yo woman w/ rheumatoid arthritis, chronic pain, and cervical stenosis s/p  anterior c7-t1 spine fusion and discectomy on 11/3 due to two weeks of bilateral lower extremity paresthesias and weakness.  Her LE weakness returned, so she presented to the ED on 9/7.    Relevant Problems   No relevant active problems     Relevant surgical history:       Medications:     Outpatient:   Current Outpatient Medications   Medication Instructions    acetaminophen (TYLENOL) 1,000 mg, Oral, Every 6 hours PRN    certolizumab (Cimzia Starter Kit) 6 X 200 MG/ML prefilled syringe kit Inject 2 syringes (400 mg) under the skin initially, and at weeks 2 and 4 followed by 200 mg every other week    Cimzia 200 mg, Subcutaneous, Every 2 weeks    Fexofenadine HCl (ALLEGRA OR) 1 tablet, Daily    gabapentin 300 MG capsule Take 1 capsule (300 mg) by mouth in the morning, 1 capsule (300 mg) in the afternoon, and 2 capsules (600 mg) before bedtime as directed.    leflunomide 20 MG tablet TAKE 1 TABLET DAILY    mupirocin 2 % ointment 1 application , Topical, Daily, Apply to nose.    ondansetron (ZOFRAN) 4 mg, Oral, Every 8 hours PRN    oxyCODONE 5 MG tablet Take 1 to 1 and 1/2  tablets (5 to 7.5 mg) by mouth every 4 hours as needed for moderate pain or severe pain for up to 7 days.    polyethylene glycol 3350 17 GM/SCOOP oral powder Fill cap with powder to the 17 gram mark and dissolve in 4 to 8 ounces of water. Take 17 g by mouth daily.    psyllium 58.12 % packet 1 packet, Oral, Daily PRN, Mix in 8 oz of juice or water and drink.    Senna-Time 17.2 mg, Oral, 2 times daily    valACYclovir (VALTREX) 2,000 mg, Oral, Every morning        Inpatient:   Scheduled      dexAMETHasone, 4 mg, QID    lidocaine, 2 patch, Once      Continuous  REM      PRN    acetaminophen, 650 mg, q4h PRN    melatonin, 3 mg, q HS PRN    ondansetron, 4 mg, q8h PRN    ondansetron, 4 mg, q8h PRN    oxyCODONE, 5-10 mg, q3h PRN    polyethylene glycol 3350, 17 g, Daily PRN    psyllium, 1 packet, BID PRN    senna, 17.2 mg, BID PRN          Review of patient's allergies indicates:  Allergies   Allergen Reactions    Fentanyl Unknown and Other     Per patient, she has "forgotten to breathe"    Hydromorphone OZ:[id number]/[id number]    Remicade [Infliximab] Anaphylaxis    Adhesives Skin: Rash     Dermabond causes skin blistering    Codeine Unknown and Other     "Feels like her skin is crawling"  Extreme feeling of something crawling    Nortriptyline Unknown  and Other     Heart palpitations         Social History:     Medical History and Review of Systems  Documentation reviewed: Electronic medical record.    Source of information: In person visit and Chart review.  History of anesthetic complications  (-) History of anesthetic complications.      Neuro/Psych   (+) chronic pain    Musculoskeletal   (+) osteoarthritis  (+) cervical spine disease    Endo/Immunology   (+) immunosuppression (on certolizumab and leflunomide)  (+) autoimmune disease, rheumatoid arthritis              Physical Exam  Airway  Mallampati:  II  Upper Lip Bite Test: I  TM distance:  >6 cm  Neck ROM:  Limited(Extension)  Mouth Opening:  Normal    Dental  normal      Cardiovascular  normal    Rhythm:  Regular  Rate:  Normal    Pulmonary  normal    Breath sounds clear to auscultation             Labs: (last year)    BMP  CBC/Coags   Na 140 08/26/2022  Hb 13.2 08/26/2022   K 4.2 08/26/2022  HCT 42 08/26/2022   Cl 107 08/26/2022  WBC 6.21 08/26/2022   HCO3 24 08/26/2022  PLT 207 08/26/2022   BUN 9 08/26/2022  INR 1.0 08/25/2022   Cr 0.39 08/26/2022  PT 13.0 08/25/2022   Glu 104 08/26/2022  PTT 29 08/25/2022       Misc   eGFR >60 08/26/2022  MCV 100 (H) 08/26/2022   A1C  4.5 08/26/2022  BNP - -       LFTs   AST 22 08/25/2022  Albumin 3.6 08/25/2022   ALT 13 08/25/2022  Protein 7.0 08/26/2022   Alk Phos 59 08/25/2022  T Bili 0.4 08/25/2022      ABG    08/21/2022   pH PaCO2 PaO2 HCO3 Lactate   7.36 42 181 (H) 24 0.8         Relevant procedures / diagnostic studies:     Risk Calculators / Scores:   Obstructive Sleep Apnea (OSA): Intermediate Risk  Total Score: 3              Patient snores loudly     Patient is often tired     Patient is over 90 years old        Criteria that do not apply:    Patient has been observed to stop breathing during sleep    Patient has high blood pressure or is being treated for high blood pressure    Patient BMI is greater than 35 kg/m2    Patient's neck circumference is greater than 17 inches (female) or 16 inches (female)    Patient is female          PONV: High Risk  Total Score: 3            Female patient     Non-smoker     Intended opioid administration        Criteria that do not apply:    History of PONV    History of motion sickness                PAT CLINIC DISCUSSION    ANESTHESIA PLAN   Informed Consent:     Anesthesia Plan discussed with:        Patient  and partner/spouse    Use of blood products:             Discussed with: patient and partner/spouse  ASA Score:     ASA: 3  Planned Anesthetic Type:      general  Supervising Provider Comments:      We discussed plan for GETA, 2 PIV, +/- arterial line. We discussed her recent previous anesthetic and her recovery which she reports was well tolerated from a pain standpoint. She is okay to receive fentanyl but would like to avoid hydromorphone given she has had severe N/V from this. We dicussed multi-modal pain plan. Surgery team with no plans for SSEP/MEP. Full risks and benefits discussed with the patient and all questions answered to patient's satisfaction.

## 2022-08-27 ENCOUNTER — Inpatient Hospital Stay (HOSPITAL_COMMUNITY)
Payer: No Typology Code available for payment source | Admitting: Student in an Organized Health Care Education/Training Program

## 2022-08-27 ENCOUNTER — Inpatient Hospital Stay (HOSPITAL_COMMUNITY): Payer: No Typology Code available for payment source

## 2022-08-27 ENCOUNTER — Encounter (HOSPITAL_COMMUNITY)
Admission: EM | Disposition: A | Discharge status: Home / Self Care | Payer: Self-pay | Source: Home / Self Care | Attending: Neurological Surgery

## 2022-08-27 DIAGNOSIS — M959 Acquired deformity of musculoskeletal system, unspecified: Secondary | ICD-10-CM

## 2022-08-27 DIAGNOSIS — Z9889 Other specified postprocedural states: Secondary | ICD-10-CM

## 2022-08-27 DIAGNOSIS — M4802 Spinal stenosis, cervical region: Secondary | ICD-10-CM

## 2022-08-27 HISTORY — PX: POSTERIOR CERVICAL LAMINECTOMY: SHX2248

## 2022-08-27 HISTORY — PX: CERVICAL LAMINECTOMY: SHX94

## 2022-08-27 LAB — PROTEIN ELECTR. REFLX PNL
Albumin: 4 g/dL (ref 3.5–4.9)
Alpha 1: 0.3 g/dL (ref 0.1–0.3)
Alpha 2: 1.1 g/dL — ABNORMAL HIGH (ref 0.3–0.8)
Beta: 0.8 g/dL (ref 0.6–1.0)
Electrophoresis Interp:: NEGATIVE — AB
Gamma: 0.9 g/dL (ref 0.4–1.4)
Protein (Total): 7 g/dL (ref 6.0–8.2)

## 2022-08-27 LAB — CBC (HEMOGRAM)
Hematocrit: 38 % (ref 36.0–45.0)
Hemoglobin: 12.5 g/dL (ref 11.5–15.5)
MCH: 32.5 pg (ref 27.3–33.6)
MCHC: 33 g/dL (ref 32.2–36.5)
MCV: 98 fL (ref 81–98)
Platelet Count: 256 10*3/uL (ref 150–400)
RBC: 3.85 10*6/uL (ref 3.80–5.00)
RDW-CV: 12.9 % (ref 11.0–14.5)
WBC: 10.47 10*3/uL — ABNORMAL HIGH (ref 4.3–10.0)

## 2022-08-27 LAB — BASIC METABOLIC PANEL
Anion Gap: 9 (ref 4–12)
Calcium: 9.1 mg/dL (ref 8.9–10.2)
Carbon Dioxide, Total: 23 meq/L (ref 22–32)
Chloride: 106 meq/L (ref 98–108)
Creatinine: 0.46 mg/dL (ref 0.38–1.02)
Glucose: 130 mg/dL — ABNORMAL HIGH (ref 62–125)
Potassium: 4.1 meq/L (ref 3.6–5.2)
Sodium: 138 meq/L (ref 135–145)
Urea Nitrogen: 13 mg/dL (ref 8–21)
eGFR by CKD-EPI 2021: >60 mL/min/{1.73_m2} (ref >59)

## 2022-08-27 SURGERY — LAMINECTOMY, SPINE, CERVICAL, WITH DECOMPRESSION
Anesthesia: General | Site: Spine Cervical | Wound class: Class I/ Clean

## 2022-08-27 MED ORDER — PROPOFOL 10 MG/ML IV EMUL WRAPPER (OSM ONLY)
INTRAVENOUS | Status: DC | PRN
Start: 2022-08-27 — End: 2022-08-27
  Administered 2022-08-27: 30 mg via INTRAVENOUS
  Administered 2022-08-27: 130 mg via INTRAVENOUS
  Administered 2022-08-27: 100 ug/kg/min via INTRAVENOUS

## 2022-08-27 MED ORDER — MORPHINE SULFATE (PF) 2 MG/ML IV/IJ SOLN WRAPPER
2.0000 mg | Status: AC | PRN
Start: 2022-08-27 — End: 2022-08-27
  Administered 2022-08-27: 2 mg via INTRAVENOUS
  Filled 2022-08-27: qty 1

## 2022-08-27 MED ORDER — ONDANSETRON HCL 4 MG/2ML IJ SOLN
4.0000 mg | INTRAMUSCULAR | Status: DC | PRN
Start: 2022-08-27 — End: 2022-08-27

## 2022-08-27 MED ORDER — FENTANYL CITRATE (PF) 100 MCG/2ML IJ SOLN
INTRAMUSCULAR | Status: AC
Start: 2022-08-27 — End: 2022-08-27
  Filled 2022-08-27: qty 2

## 2022-08-27 MED ORDER — DEXAMETHASONE SOD PHOSPHATE PF 10 MG/ML IJ SOLN
INTRAMUSCULAR | Status: AC
Start: 2022-08-27 — End: 2022-08-27
  Filled 2022-08-27: qty 1

## 2022-08-27 MED ORDER — MIDAZOLAM HCL (PF) 1 MG/ML IJ SOLN WRAPPER (ANESTHESIA OSM ONLY)
INTRAMUSCULAR | Status: DC | PRN
Start: 2022-08-27 — End: 2022-08-27
  Administered 2022-08-27: 2 mg via INTRAVENOUS

## 2022-08-27 MED ORDER — BACITRACIN 500 UNIT/GM EX OINT (WRAPPER)
TOPICAL_OINTMENT | CUTANEOUS | Status: DC | PRN
Start: 2022-08-27 — End: 2022-08-27
  Administered 2022-08-27: 1 via TOPICAL

## 2022-08-27 MED ORDER — ONDANSETRON HCL 4 MG/2ML IJ SOLN
INTRAMUSCULAR | Status: AC
Start: 2022-08-27 — End: 2022-08-27
  Filled 2022-08-27: qty 2

## 2022-08-27 MED ORDER — SUGAMMADEX SODIUM 200 MG/2ML IV SOLN
INTRAVENOUS | Status: DC | PRN
Start: 2022-08-27 — End: 2022-08-27
  Administered 2022-08-27: 200 mg via INTRAVENOUS

## 2022-08-27 MED ORDER — NALOXONE HCL 0.4 MG/ML IJ SOLN
0.0400 mg | INTRAMUSCULAR | Status: DC | PRN
Start: 2022-08-27 — End: 2022-08-27

## 2022-08-27 MED ORDER — LACTATED RINGERS IV SOLN
INTRAVENOUS | Status: DC | PRN
Start: 2022-08-27 — End: 2022-08-27

## 2022-08-27 MED ORDER — OXYCODONE HCL 5 MG OR TABS
5.0000 mg | ORAL_TABLET | ORAL | Status: DC | PRN
Start: 2022-08-27 — End: 2022-08-27
  Administered 2022-08-27: 10 mg via ORAL
  Filled 2022-08-27: qty 2

## 2022-08-27 MED ORDER — PHENYLEPHRINE HCL-NACL 25-0.9 MG/250ML-% IV SOLN
INTRAVENOUS | Status: DC | PRN
Start: 2022-08-27 — End: 2022-08-27
  Administered 2022-08-27: .3 ug/kg/min via INTRAVENOUS

## 2022-08-27 MED ORDER — CEFAZOLIN SODIUM-DEXTROSE 2-4 GM/100ML-% IV SOLN
2.0000 g | Freq: Three times a day (TID) | INTRAVENOUS | Status: AC
Start: 2022-08-27 — End: 2022-08-28
  Administered 2022-08-27 – 2022-08-28 (×3): 2 g via INTRAVENOUS
  Filled 2022-08-27 (×3): qty 100

## 2022-08-27 MED ORDER — SODIUM CHLORIDE 0.9 % IV SOLN
1.0000 g | Freq: Once | INTRAVENOUS | Status: AC
Start: 2022-08-27 — End: 2022-08-27
  Administered 2022-08-27: 1 g via INTRAVENOUS
  Filled 2022-08-27: qty 10

## 2022-08-27 MED ORDER — CEFAZOLIN SODIUM-DEXTROSE 2-4 GM/100ML-% IV SOLN
2.0000 g | INTRAVENOUS | Status: AC
Start: 2022-08-27 — End: 2022-08-27
  Administered 2022-08-27: 2 g via INTRAVENOUS
  Filled 2022-08-27 (×3): qty 100

## 2022-08-27 MED ORDER — ACETAMINOPHEN 325 MG OR TABS
650.0000 mg | ORAL_TABLET | ORAL | Status: DC
Start: 2022-08-27 — End: 2022-08-27

## 2022-08-27 MED ORDER — SUGAMMADEX SODIUM 200 MG/2ML IV SOLN
INTRAVENOUS | Status: AC
Start: 2022-08-27 — End: 2022-08-27
  Filled 2022-08-27: qty 2

## 2022-08-27 MED ORDER — LIDOCAINE HCL 4 % MT SOLN
OROMUCOSAL | Status: DC | PRN
Start: 2022-08-27 — End: 2022-08-27
  Administered 2022-08-27: 2 mL via ENDOTRACHEOPULMONARY

## 2022-08-27 MED ORDER — MIDAZOLAM HCL (PF) 2 MG/2ML IJ SOLN
INTRAMUSCULAR | Status: AC
Start: 2022-08-27 — End: 2022-08-27
  Filled 2022-08-27: qty 2

## 2022-08-27 MED ORDER — ROCURONIUM BROMIDE 50 MG/5ML IV SOLN
INTRAVENOUS | Status: DC | PRN
Start: 2022-08-27 — End: 2022-08-27
  Administered 2022-08-27: 50 mg via INTRAVENOUS

## 2022-08-27 MED ORDER — LIDOCAINE HCL PF 2% IV/IJ SOSY/SOLN WRAPPER (ANESTHESIA OSM ONLY)
INTRAVENOUS | Status: DC | PRN
Start: 2022-08-27 — End: 2022-08-27
  Administered 2022-08-27: 80 mg via INTRAVENOUS

## 2022-08-27 MED ORDER — MORPHINE SULFATE (PF) 2 MG/ML IV/IJ SOLN WRAPPER
Status: AC
Start: 2022-08-27 — End: 2022-08-27
  Administered 2022-08-27: 2 mg via INTRAVENOUS
  Filled 2022-08-27: qty 1

## 2022-08-27 MED ORDER — BUPIVACAINE-EPINEPHRINE 0.5% -1:200000 IJ SOLN
INTRAMUSCULAR | Status: DC | PRN
Start: 2022-08-27 — End: 2022-08-27
  Administered 2022-08-27: 30 mL via INTRAMUSCULAR

## 2022-08-27 MED ORDER — PHENYLEPHRINE HCL-NACL 1-0.9 MG/10ML-% IV SOSY
PREFILLED_SYRINGE | INTRAVENOUS | Status: DC | PRN
Start: 2022-08-27 — End: 2022-08-27
  Administered 2022-08-27 (×4): 100 ug via INTRAVENOUS
  Administered 2022-08-27: 50 ug via INTRAVENOUS
  Administered 2022-08-27: 200 ug via INTRAVENOUS

## 2022-08-27 MED ORDER — LIDOCAINE HCL (PF) 2 % IJ SOLN
INTRAMUSCULAR | Status: AC
Start: 2022-08-27 — End: 2022-08-27
  Filled 2022-08-27: qty 5

## 2022-08-27 MED ORDER — LACTATED RINGERS IV SOLN
100.0000 mL/h | INTRAVENOUS | Status: DC
Start: 2022-08-27 — End: 2022-08-27

## 2022-08-27 MED ORDER — SODIUM CHLORIDE 0.9 % IV SOLN
INTRAVENOUS | Status: DC | PRN
Start: 2022-08-27 — End: 2022-08-27
  Administered 2022-08-27: .05 ug/kg/min via INTRAVENOUS

## 2022-08-27 MED ORDER — ROCURONIUM BROMIDE 50 MG/5ML IV SOLN
INTRAVENOUS | Status: AC
Start: 2022-08-27 — End: 2022-08-27
  Filled 2022-08-27: qty 5

## 2022-08-27 MED ORDER — REMIFENTANIL HCL 1 MG IV SOLR
INTRAVENOUS | Status: AC
Start: 2022-08-27 — End: 2022-08-27
  Filled 2022-08-27: qty 1

## 2022-08-27 MED ORDER — FENTANYL CITRATE (PF) 50 MCG/ML IJ SOLN WRAPPER (ANESTHESIA OSM ONLY)
INTRAMUSCULAR | Status: DC | PRN
Start: 2022-08-27 — End: 2022-08-27
  Administered 2022-08-27 (×4): 50 ug via INTRAVENOUS

## 2022-08-27 MED ORDER — KETOROLAC TROMETHAMINE 15 MG/ML IJ SOLN
15.0000 mg | Freq: Once | INTRAMUSCULAR | Status: AC
Start: 2022-08-27 — End: 2022-08-27
  Administered 2022-08-27: 15 mg via INTRAVENOUS
  Filled 2022-08-27: qty 1

## 2022-08-27 MED ORDER — PROPOFOL 200 MG/20ML IV EMUL
INTRAVENOUS | Status: AC
Start: 2022-08-27 — End: 2022-08-27
  Filled 2022-08-27: qty 20

## 2022-08-27 MED ORDER — MORPHINE SULFATE (PF) 2 MG/ML IV/IJ SOLN WRAPPER
2.0000 mg | Status: DC | PRN
Start: 2022-08-27 — End: 2022-08-27
  Administered 2022-08-27: 2 mg via INTRAVENOUS
  Filled 2022-08-27: qty 1

## 2022-08-27 MED ORDER — ACETAMINOPHEN 10 MG/ML IV SOLN
1000.0000 mg | Freq: Once | INTRAVENOUS | Status: AC
Start: 2022-08-27 — End: 2022-08-27
  Administered 2022-08-27: 1000 mg via INTRAVENOUS
  Filled 2022-08-27: qty 100

## 2022-08-27 MED ORDER — ONDANSETRON HCL 4 MG/2ML IJ SOLN
INTRAMUSCULAR | Status: DC | PRN
Start: 2022-08-27 — End: 2022-08-27
  Administered 2022-08-27: 4 mg via INTRAVENOUS

## 2022-08-27 MED ORDER — FENTANYL CITRATE (PF) 100 MCG/2ML IJ SOLN
25.0000 ug | INTRAMUSCULAR | Status: DC | PRN
Start: 2022-08-27 — End: 2022-08-27
  Administered 2022-08-27: 50 ug via INTRAVENOUS
  Administered 2022-08-27 (×2): 25 ug via INTRAVENOUS
  Filled 2022-08-27: qty 2

## 2022-08-27 MED ORDER — PROPOFOL 500 MG/50ML IV EMUL INFUSION
INTRAVENOUS | Status: AC
Start: 2022-08-27 — End: 2022-08-27
  Filled 2022-08-27: qty 100

## 2022-08-27 SURGICAL SUPPLY — 40 items
ADHESIVE LIQUID MASTISOL 2/3ML (Other) IMPLANT
AGENT FLOSEAL 10ML MATRIX (Dressing) ×1 IMPLANT
BAND BAG 36INX30IN (Other) ×2 IMPLANT
BURR 3MM MATCH HEAD CARBIDE (Bur) ×1 IMPLANT
CORD BIPOLAR 12FT CAUTERY STD FORCEP STERILE LF (Cautery) ×1 IMPLANT
COVER OR TABLE THIGH PAD COVER TABLE SPINAL SURG (Other) ×1 IMPLANT
DRAPE 74X41IN UNIV VELCRO POLY MOBILE XRAY STRAP C (Drape) ×1 IMPLANT
DRAPE CLOTH 54X90 ST (Drape) ×1 IMPLANT
DRAPE CLOTH 60X60 ST (Drape) ×1 IMPLANT
DRAPE EQUIP OPMI 154INX52IN MICROSCOPE PENTERO (Drape) IMPLANT
DRAPE IOBAN 60CM X 45CM (Drape) ×1 IMPLANT
DRAPE PROXIMA 121INX102INX78IN ABSORB REINFORCEMENT FENESTRATE (Drape) ×1 IMPLANT
DRESSING FOAM MEPILEX BORDER 3INX3IN (Dressing) ×1 IMPLANT
DRESSING FOAM MEPILEX BORDER 4IN X 8IN (Dressing) ×4 IMPLANT
DRESSING FOAM MEPILEX BORDER 6INX6IN (Dressing) ×2 IMPLANT
DRESSING FOAM SACRUM MEPILEX 7.9INX6.3IN (Dressing) ×2 IMPLANT
DRESSING PRIMAPORE 8INX4IN HIGH ABSORB PAD ACRYL (Dressing) IMPLANT
ELECTRODE PATIENT RETURN POLYHESVIE II REM W/ CORD (Other) ×1 IMPLANT
GLOVE SURG 8 BIOGEL MICRO INDICATOR PF (Glove) ×2 IMPLANT
GLOVE SURG BIOGEL 7 1/2 ULTRATOUCH PF (Glove) ×2 IMPLANT
KIT DRAIN 400CC 1/8IN 3SPRING EVACUATOR (Drain) IMPLANT
PACK CUSTOM ORTHO SPINE (Pack) ×1 IMPLANT
PILLOW POSITION SHEARGUARD CHEST SPINAL SURG TABLE (Other) ×1 IMPLANT
PROTECT TRENDELENBURG ARM STD (Other) ×1 IMPLANT
SKIN CLOSURE STERI-STRIP 4INX1/2IN (Dressing) IMPLANT
SPONGE ABSORB 12.5X8CM PORCINE GELATIN SURGIFOAM (Sponge) ×1 IMPLANT
SPONGE SURG .25INX9/16IN (Sponge) IMPLANT
SUTURE BIOSYN 4-0 P-12 30IN UNDYED (Suture) IMPLANT
SUTURE MONOCRYL 4-0 PC-3 18IN UNDYED (Suture) ×1 IMPLANT
SUTURE POLYSORB 0 GS-21 18IN VIOLET (Suture) ×2 IMPLANT
SUTURE POLYSORB 0 GS-21 NDL 18IN UNDYED (Suture) ×1 IMPLANT
SUTURE POLYSORB 2 GS-26 30IN VIOLET (Suture) ×1 IMPLANT
SUTURE POLYSORB 2-0 5X18 GS-21 D-TACH UNDYED (Suture) ×2 IMPLANT
SUTURE POLYSORB 2-0 HOS-10 18IN UNDYED (Suture) IMPLANT
SUTURE POLYSORB 2-0 HOS-12 18IN VIOLET (Suture) ×1 IMPLANT
SUTURE STRATAFIX 45CM 1 CT-1 VIOLET (Suture) ×1 IMPLANT
SUTURE SURGILON 4-0 CV-22 18IN BLACK (Suture) IMPLANT
SYRINGE 30ML LL (Syringe) ×1 IMPLANT
TOWEL SURG PACK (Towel) ×3 IMPLANT
TUBE SUCTION FRAZIER 5MM 2FT MALE CONNECT (Other) ×1 IMPLANT

## 2022-08-27 NOTE — Anesthesia Procedure Notes (Signed)
Airway Placement    Staff:  Performing Provider: Phebe Colla, CRNA  Authorizing Provider: Ramonita Lab, DO    Airway management:   Patient location: OR/Procedural area  Final airway type: Endotracheal airway  Intubation reason: General anesthesia/planned procedure    Induction:  Positioning: supine  Preoxygenation: Yes  Mask Ventilation: Grade 1 - Ventilated by mask     Intubation:    Number of Attempts: 1    Final Attempt   Airway Type: ETT  Primary Laryngoscopy: McGrath  Blade Size: 3  Laryngoscopic View: Grade I  ETT Type: standard, cuffed  ETT Route: oral  Size: 6.5  ETT secured with adhesive tape  Depth at: teeth  (20cm)    Assessment:  Confirmation: auscultation, waveform capnography and direct visualization  Difficult Airway: no  Procedure Abandoned: no    Date / Time Airway Secured / Re-Secured:  08/27/2022 1:19 PM    Final procedure comments:  Eyes taped shut bilaterally. Easy mask ventilation. 57m 4% Lidocaine LTA. Easy and atraumatic placement of 6.5 ETT. +EtCO2. EBBS. Secured. Oral structures intact and unchanged post intubation.

## 2022-08-27 NOTE — Nursing Note (Signed)
1900-2300: A&Ox4. VSS on RA. Neck/back pain managed with ice, PRN tylenol, and oxy ATC. Surgical site primapore CDI. HV x1 with bloody/serosang output. BLE 4/5 strength. Pt reports improvement in baseline numbness to extremities. Good PO intake. No void since surgery. LBM 11/8. 2PA to [facility] prior to surgery. Turning well in bed.

## 2022-08-27 NOTE — Anesthesia Postprocedure Evaluation (Signed)
Patient: Luxe Cuadros    Procedure Summary:   Date: 08/27/2022  Room/Location: [facility] OR 07 / [facility] OR    Anesthesia Start:  1:05 PM  Anesthesia Stop:  3:03 PM    Procedure(s):  Cervical 7 laminectomy, additional levels as needed  Post-op Diagnosis     * Cervical stenosis of spinal canal [M48.02]    Responsible Provider:   Ramonita Lab, DO  ASA Status: 2     Vitals Value Taken Time   BP 110/66 08/27/22 1700   Temp 36.5 C 08/27/22 1455   Pulse 81 08/27/22 1710   SpO2 97 % 08/27/22 1710   Vitals shown include unfiled device data.    Place of evaluation: PACU    Patient participation: patient participated    Level of consciousness: fully conscious    Patient pain control satisfaction: patient is satisfied with level of pain control    Airway patency: patent    Cardiovascular status during assessment: stable    Respiratory status during assessment: breathing comfortably    Anesthetic complications: no    Intravascular volume status assessment: euvolemic    Nausea / vomiting: patient is not experiencing nausea      Planned post-operative disposition at time of assessment: ward care    Additional comments: Pain initially severe, but controlled in PACU with IV morphine, PO oxycodone, IV robaxin, and single ketorolac dose. Stable now for transfer to acute care.

## 2022-08-27 NOTE — Interval H&P Note (Signed)
The linked note is still valid, except for the following changes or additions:Pt with persistent stenosis at C7 from posterior element hypertrophy. Plan to proceed with C7 laminectomy

## 2022-08-27 NOTE — Nursing Note (Signed)
Patient Summary  Autumn Camacho is a 56 year old female with RA who is s/p C7-T1 ACDF 11/3 who presents for worsening BLE weakness/numbness/tingling along since last night.      Returned from PACU at 1800. A&Ox4. VSS on RA. Pain managed w/ prn tylenol and prn oxy 5 mg. Surgical incision to posterior neck covered with primapore, CDI. 1 HV drain to R posterior neck with bloody drainage. BLE 4/5 strength in bed. Pt reports decrease in baseline BLE numbness, and increased sensation to BLE and LUE. Voiding and BM in BSC. LBM 11/7. 2PA pivot to commode for instability.    Illness Severity  Stable

## 2022-08-27 NOTE — Brief Op Note (Addendum)
Immediate Brief Operative Note    Autumn Camacho - DOB: 1966/07/29 (56 year old female) MRN: [id number]  Procedure Date: 08/27/2022     [facility]           PROCEDURE DETAILS    Procedure Team:  Primary: Charlynn Court, MD  Resident - Assisting: Asencion Partridge, MD     Procedure(s):   Cervical 7 laminectomy, additional levels as needed   Pre Procedure Diagnosis:  Cervical stenosis of spinal canal [M48.02]     Post Procedure Diagnosis:        * Cervical stenosis of spinal canal [M48.02]       PROCEDURE SUMMARY    Anesthesia: General  Estimated Blood Loss:50 mL     Wound Closure: Primary Closure (Primary Intention)  Wound Class: Procedure(s):  Cervical 7 laminectomy, additional levels as needed - Wound Class: Class I/ Clean   Drains:  Closed/Suction Drain 08/27/22 1 Right;Posterior Neck Accordion (Active)       IMPLANTS   Nothing was implanted during the procedure     SPECIMENS:   No specimens were documented in this log.    POST OP PLAN   UR XR when able  Hvx1  Ancefx24hrs  Diet as tol  OOB as able  SCDs overnight  PT/OT, likely rhb  No brace

## 2022-08-27 NOTE — Procedure Nursing Note (Signed)
PACU Summary, Inpatient Transfer to Acute Care or ICU:    Illness severity: Stable: condition holding or improving   Most significant event:recovery progressing, vitals stable had pain control issues.    Patient summary/synopsis  Level of consciousness: awake, alert, and oriented  Nausea: None, denies  Dressing(s) are: clean dry intact  Pain is present: Yes 3/10. Pain is tolerable or patient has good function: Yes. Pt originally in severe 10/10 pain, multiple pain interventions, see MAR. Upon discharge from PACU, pain down to 3/10, pt reporting feeling much more comfortable.  Oral fluids taken: Yes  Precautions or restrictions: Fall    Transfer destination: 6SE

## 2022-08-27 NOTE — Progress Notes (Addendum)
Post op check    56 year old female // [G] BLE wk -> C7 lami [s/p C7-T1 ACDF 11/3 AA,RA]  Edited by: Irma Newness, MD at 08/27/2022 1603    Initially had trouble with pain control, doing well at this time      Exam:  A&Ox3  PERRL, EOMI, FS    R L  UPPER  D  5 5  B (C5-6) 5 5  T (C7-8) 4+ 5  WE (C6) 5 5  FDP (C8) 5 5  ADM (T1) 5 5  ---  LOWER  IP (L2-3) 5 5  Q (L4)  5 5  TA (L4-5) 5 5  G (S1)  5 5  EHL (L5) 5 5     Reflexes 2+ throughout  No clonus, hoffmans, toes down for babinski  Sensation grossly intact throughout    Plan:  [place] for Q4 NC   Diet - regular  BP goals - normotension  Imaging: MRI full spine completed with upright XRs yesterday, reviewed  Brace- none  Steroids: Dexamethasone 4q6  VTE PPX:  SCDs only  PT/OT/SLP  Consults:    ETSS-: Endocrine consult + Polyuria PP  ENT  Neurology- possible neurology consult  Medicine  ID  Oncology  Rheum - brief convo over phone regarding sx and hx, unlikely RA flare in such a short time span, if pain continues to worsen and is uncontrolled post-op, place consult order and they are happy to see pt  Suture removal: absorbable, none to remove  Dispo:  Pending

## 2022-08-27 NOTE — Anesthesia Procedure Notes (Signed)
PERIPHERAL IV    Staff:  Performing provider: Ramonita Lab, DO  Authorizing provider: Ramonita Lab, DO    Procedure Indication/Location:  Patient location: OR/Procedural area  Indication for IV: for care provided by anesthesia    Placement:  Laterality: left  Location: antecubital  Size: 16 G  Total attempts for all operators: 1    Date and Time Placed:   08/27/2022 1:18 PM

## 2022-08-27 NOTE — Nursing Note (Signed)
Patient Summary  Autumn Camacho is a 56 year old female with RA who is s/p C7-T1 ACDF 11/3 who presents for worsening BLE weakness/numbness/tingling along since last night.      A&Ox4. VSS on RA. Endorsing back pain, pain managed w/ prn tylenol and PRN oxy 5 mg. Baseline BLE neuropathy. 4/5 strength in bed but weakness when bearing weight. Surgical site on L side of anterior neck OTA with redness and swelling, allergy to glue. Voiding and BM in BSC. LBM 11/7.  NPO at midnight for OR. 2PA pivot to commode for instability.      Illness Severity  Stable    Edited by: Morene Antu, RN at 08/27/2022 [phone]

## 2022-08-28 LAB — BASIC METABOLIC PANEL
Anion Gap: 11 (ref 4–12)
Calcium: 8.6 mg/dL — ABNORMAL LOW (ref 8.9–10.2)
Carbon Dioxide, Total: 22 meq/L (ref 22–32)
Chloride: 107 meq/L (ref 98–108)
Creatinine: 0.53 mg/dL (ref 0.38–1.02)
Glucose: 108 mg/dL (ref 62–125)
Potassium: 3.9 meq/L (ref 3.6–5.2)
Sodium: 140 meq/L (ref 135–145)
Urea Nitrogen: 15 mg/dL (ref 8–21)
eGFR by CKD-EPI 2021: >60 mL/min/{1.73_m2} (ref >59)

## 2022-08-28 LAB — CBC (HEMOGRAM)
Hematocrit: 37 % (ref 36.0–45.0)
Hemoglobin: 12.1 g/dL (ref 11.5–15.5)
MCH: 33.1 pg (ref 27.3–33.6)
MCHC: 33 g/dL (ref 32.2–36.5)
MCV: 100 fL — ABNORMAL HIGH (ref 81–98)
Platelet Count: 271 10*3/uL (ref 150–400)
RBC: 3.66 10*6/uL — ABNORMAL LOW (ref 3.80–5.00)
RDW-CV: 13 % (ref 11.0–14.5)
WBC: 15.22 10*3/uL — ABNORMAL HIGH (ref 4.3–10.0)

## 2022-08-28 LAB — COVID-19 CORONAVIRUS QUALITATIVE PCR: COVID-19 Coronavirus Qual PCR Result: NOT DETECTED

## 2022-08-28 MED ORDER — DEXAMETHASONE 1 MG OR TABS
2.0000 mg | ORAL_TABLET | Freq: Every day | ORAL | Status: AC
Start: 2022-08-31 — End: 2022-08-31
  Administered 2022-08-31: 2 mg via ORAL
  Filled 2022-08-28: qty 2

## 2022-08-28 MED ORDER — HEPARIN SODIUM (PORCINE) 5000 UNIT/ML IJ SOLN
5000.0000 [IU] | Freq: Two times a day (BID) | INTRAMUSCULAR | Status: DC
Start: 2022-08-28 — End: 2022-08-31
  Administered 2022-08-28 – 2022-08-31 (×6): 5000 [IU] via SUBCUTANEOUS
  Filled 2022-08-28 (×6): qty 1

## 2022-08-28 MED ORDER — DEXAMETHASONE 4 MG OR TABS
4.0000 mg | ORAL_TABLET | Freq: Two times a day (BID) | ORAL | Status: AC
Start: 2022-08-28 — End: 2022-08-29
  Administered 2022-08-28 – 2022-08-29 (×2): 4 mg via ORAL
  Filled 2022-08-28 (×2): qty 1

## 2022-08-28 MED ORDER — DEXAMETHASONE 1 MG OR TABS
2.0000 mg | ORAL_TABLET | Freq: Two times a day (BID) | ORAL | Status: AC
Start: 2022-08-29 — End: 2022-08-30
  Administered 2022-08-29 – 2022-08-30 (×2): 2 mg via ORAL
  Filled 2022-08-28 (×2): qty 2

## 2022-08-28 NOTE — Op Note (Addendum)
Operative Report     Patient Name: Autumn Camacho, Autumn Camacho.  Date of Service: August 27, 2022    Patient ID: [id number] Date of Birth: 01/22/1966    Clinician: Jackie Plum, MD; PHD Facility: [id number]   Location: [facility]         PREOPERATIVE DIAGNOSES:    1.  Cervical myelopathy.  2.  History of C7-T1 herniated disk, status post recent anterior cervical diskectomy and fusion.  3.  Cervical stenosis.     POSTOPERATIVE DIAGNOSES:    1.  Cervical myelopathy.  2.  History of C7-T1 herniated disk, status post recent anterior cervical diskectomy and fusion.  3.  Cervical stenosis.      PROCEDURES PERFORMED:    1.  C7 laminectomy and inferior C6 laminotomy.  2.  Use of intraoperative fluoroscopy.     ATTENDING SURGEON:    Nelson Chimes, MD PhD     ASSISTANT:    Asencion Partridge, MD     ANESTHESIA:    General endotracheal.     ESTIMATED BLOOD LOSS:    20 mL.     INDICATIONS:    Autumn Camacho is a 56 year old woman who had presented with gait instability and weakness after a ground level fall approximately a week ago and was found to have a C7-T1 disk with cord signal change.  She underwent an ACDF by one of my partners and improved considerably postoperatively and was able to be discharged home 2 days later.  She re-presented to the Emergency Department with proprioceptive difficulties in both of her lower extremities to the point that she had difficulty walking to the bathroom and required assistance. An MRI showed likely persistent stenosis from posteriorly and she was offered a posterior decompression in the form of the above procedures.  Indications, risks, benefits, and alternatives were explained.  No guarantees were made, she signed informed consent and asked [place] to proceed.     DESCRIPTION OF PROCEDURE:    The patient was positively identified and brought to the operating room where a mini check was performed.  Induction and intubation were carried out by the anesthesia service.  She was positioned prone and her  head fixed in pins.  All pressure points were meticulously padded.  The lower cervical and upper thoracic midline was marked out and a preoperative AP and lateral x-rays were performed to verify the C7 level, which could be corroborated by the interbody spacer placed during her last surgery.  The area was prepped and draped in the usual sterile fashion.  A timeout procedure was performed.  Antibiotics and local anesthetic were administered.       The incision was carried out sharply and advanced in the midline down to the spinous processes.  Self-retaining retractors were placed.  The muscle was elevated off the lamina bilaterally and the retractors advanced. An additional x-ray was taken at this time to confirm the C7 level, the spinous process of C7 as well as some T1 were removed.  Troughs were drilled at the junction between the lamina and lateral mass bilaterally and the lamina were removed en bloc.  The yellow ligament underneath was removed with a combination of Kerrison rongeurs and curettage.  We performed additional bony removal of the inferior C6 lamina and were able to palpate freely underneath the superior T1 lamina and the rest of the inferior C6 lamina and verify good decompression.  We obtained an additional final x-ray to again verify the correct level.  Satisfied with the extent of decompression, we thoroughly irrigated the wound and hemostasis was achieved.  A drain was placed in the subfascial space.  The soft tissues were reapproximated in anatomic layers.  The wound was cleaned and dried and the drapes were removed.  The patient's head was freed from pins and she was flipped to the supine position on the hospital bed.  Her care was returned to the anesthesia service.  She was successfully extubated in the operating room and taken to the recovery area in good condition.  All counts were correct at the end of the case x2.  I attempted to contact her husband, [place], by phone, but did not  receive a response and I left a brief voicemail.     COMPLICATIONS:    None immediately identified.     ATTENDING ATTESTATION:    Elwyn Reach, MD PhD, the attending neurosurgeon of record was present for the entirety of the procedure.                                                                             Jackie Plum, MD; PHD      Date Dictated: 08/27/2022 3:09:00 PM    Date Transcribed: 08/28/2022    SE/kf/rr   Job #: [id number]

## 2022-08-28 NOTE — Nursing Note (Signed)
2300-0700  Patient Summary  Autumn Camacho is a 56 year old female with RA who is s/p C7-T1 ACDF 11/3 who presents for worsening BLE weakness/numbness/tingling along since last night. POD1 revision laminectomy    Returned from PACU at 1800. A&Ox4. VSS on RA. Pain managed w/ prn tylenol and prn oxy 10 mg. Surgical incision to posterior neck covered with primapore, CDI. 1 HV drain to R posterior neck with bloody drainage. BLE 4/5 strength in bed. Pt reports big improvement in N/T to BLE.  Voiding in BSC. LBM 11/7. 2PA pivot to commode for instability.    Illness Severity  Stable

## 2022-08-28 NOTE — Progress Notes (Signed)
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    Osteoarthritis of right hip    Osteoarthritis of right knee    Knee dislocation, left, initial encounter    Failed total left knee replacement (HCC)    Chronic instability of left knee    Failed total knee replacement, subsequent encounter    Status post revision of total replacement of left knee    Chronic right-sided low back pain with sciatica    Degenerative disc disease, lumbar    Cervical stenosis of spinal canal    Weakness of both lower extremities       Past Medical History:   Diagnosis Date    Chronic right hip pain 02/26/2019    Allergic asthma     Fracture     Bilateral elbow fx    Genital herpes     Rheumatoid arthritis (HCC)     Rheumatoid arthritis ([place])     Spinal stenosis     Tooth disorder     MISSING TOOTH UPPER LEFT BACK-MOLAR       Past Surgical History:   Procedure Laterality Date    foot Bilateral     Arch surgery    KNEE ARTHROPLASTY      LEFT 2018 REPAIR 2019    KNEE ARTHROPLASTY Left 05/01/2020    PR ADENOIDECTOMY PRIMARY <AGE 61      AGE 56  T/A     PR ANES; TOTAL KNEE REPLACEMENT Left 04/01/2020    PR TONSILLECTOMY ONE-HALF <AGE 61      PR UNLISTED PROCEDURE FEMUR/KNEE Bilateral     TKA    PR UNLISTED PROCEDURE FEMUR/KNEE      Left revision TKA     PR UNLISTED PROCEDURE FOOT/TOES Bilateral 2009 R,  2010 L    Reconstructive surgery    PR UNLISTED PROCEDURE PELVIS/HIP JOINT      DOS 07/30/20 R THA    TKA Left 2019    revision from prior TKA       Subjective  Patient Report/Self-Assessment: eager for PT; feels she is stronger   Patient Stated  Goal(s): wants to walk     Home Living  Type of Home: Single level home  Entry Stairs: 13  Indoor Stairs: none  Lives With: Spouse  Assistance Available at Home : Intermittent assistance  Mobility Equipment Owned: [place] wheeled walker;Manual wheelchair    Prior Level of Function : just prior to surgery was having difficulty walking, used walker and husband had to help her          Objective  RUE Assessment  RUE Assessment: Exceptions to [facility]  RUE Assessment Comments: WFL except hands; windswept deformaties of fingers                        LUE Assessment  LUE Assessment: Exceptions to [facility]  LUE Assessment Comments: WFL except hands windswept deformaties  RLE Assessment  RLE Assessment: Exceptions to [facility]  RLE Assessment Comments: limited ankle DF/PF ROM; strength hip flexion 4/5, knee ext 5/5, ankle DF/PF 4/5                        LLE Assessment  LLE Assessment: Exceptions to [facility]  LLE Assessment Comments: limited ankle DF/PF; strength hip flexion 4/5, knee ext 5/5, ankle DF/PF 4/5                        Sensation  Light Touch: intact  Sensation Comments : reports some numbness in LE's but much improved from prior to surgery                    Static Sitting Balance   Static Sitting Balance Level of Assistance: Independent    Dynamic Sitting Balance  Dynamic Sitting Balance Level of Assistance: Independent    Static Standing Balance  Static Standing Balance Level of Assistance: Contact guard assist (touching/steadying assistance)  Dynamic Standing Balance  Dynamic Standing Balance Level of Assistance: Minimum Assist (less than 25% help needed)           Functional Level:  Bed Mobility  Supine to Sit: Independent    Transfers  Sit<>Stand: Contact guard assist (touching/steadying assistance)  Stand <> Sit: Contact guard assist (touching/steadying assistance)     Ambulation  Distance: 50'  Assistance Needs: Contact guard assist (touching/steadying assistance)  Assistive Device Used: Front wheeled  walker  Gait quality/descriptors: Wide BOS;Decreased step length;Decreased foot clearance                Interventions:  Therex: instructed in seated ther ex for LE strength and ROM     pt able to complete 10 reps of all                    Theract: bed mobility; independent supine->sit EOB     transfers; sit<->stand with CGA     ambulation with FWW, 50', 20' CGA     pt up to bedside chair at end of session        Balance/Neuro Re-ed:                             Gait Training:                                           ASSESSMENT/PLAN     Ovida is seen for evaluation today. She presents with difficulty mobilizing 2/2 mild LE weakness, joint impairments from RA, surgical pain. Today she is able to mobilize out of bed and ambulate short distance with FWW and 25% assistance. At baseline she was ambulating with FWW and needed a lot of assist from her spouse. She reports she is moving much better post surgery. Anticipate she will continue to progress with PT, may benefit from short term sub-acute rehab prior to DC home.      Barriers to Discharge: limited ambulation distance, stairs to enter home           PT Discharge Equipment: FWW - has own       Goals:  Goal: Transfer with AD, Independent  Estimated Completion Date: 09/03/22   Goal: Amb with AD, Supervision / Stand by assistance  Estimated  Completion Date: 09/03/22   Goal: Stairs, Contact guard assist (touching/steadying assistance)  Estimated Completion Date: 09/03/22  Goal: AMPAC>20  Estimated Completion Date: 09/03/22       PT Plan of Care:  Treatment/Interventions: Therapeutic functional activities, Gait/Stairs training, Therapeutic exercise     PT Plan: Skilled PT     PT Dosage: 3-5x/week     Plan for Next Session: increase ambulation endurance      Other Recommendations:  PT Discharge Recommendations: Follow up with PT at next level of care (to be determined by primary team)   PT Daily Recommendations: OOB to chair with 1 PA, FWW, GB    Arman Bogus, PT

## 2022-08-28 NOTE — Nursing Note (Signed)
Patient Summary  Autumn Camacho is a 56 year old female with RA who is s/p C7-T1 ACDF 11/3 who presents for worsening BLE weakness/numbness/tingling along since last night. POD1 revision laminectomy    A&Ox4. VSS on RA. Pain managed w/ prn tylenol and prn oxy 10 mg. Surgical incision to posterior neck covered with primapore, CDI. 1 HV drain to R posterior neck with bloody drainage. BLE 4/5 strength in bed. Denies N/T Voiding in [facility]. LBM 11/8 - senna and psyllium given. 1P CGA w/ FWW.        Illness Severity  Stable

## 2022-08-28 NOTE — Progress Notes (Signed)
Post op check    56 year old female // [G] BLE wk -> C7 lami [s/p C7-T1 ACDF 11/3 AA,RA]  Edited by: Irma Newness, MD at 08/27/2022 1603    - Reports significant improvement in BLE strength  - Numbness resolved completely in LUE and improved in BLE      Exam:  A&Ox3  PERRL, EOMI, FS    R L  UPPER  D  5 5  B (C5-6) 5 5  T (C7-8) 4+ 5  WE (C6) 5 5  FDP (C8) 5 5  ADM (T1) 5 5  ---  LOWER  IP (L2-3) 5 5  Q (L4)  5 5  TA (L4-5) 5 5  G (S1)  5 5  EHL (L5) 5 5     Reflexes 2+ throughout  No clonus, hoffmans, toes down for babinski  Sensation grossly intact throughout    Plan:  [place] for Q4 NC   Diet - regular  BP goals - normotension  Imaging: UXR needs   Brace- none  Steroids: Dexamethasone 4q6  Abx: [email or web address]  VTE PPX:  SCDs only  PT/OT  Consults:    Rheum - brief convo over phone regarding sx and hx, unlikely RA flare in such a short time span, if pain continues to worsen and is uncontrolled post-op, place consult order and they are happy to see pt  Suture removal: absorbable, none to remove  Dispo:  Pending

## 2022-08-29 ENCOUNTER — Inpatient Hospital Stay (HOSPITAL_COMMUNITY): Payer: No Typology Code available for payment source

## 2022-08-29 DIAGNOSIS — Z9889 Other specified postprocedural states: Secondary | ICD-10-CM

## 2022-08-29 LAB — BASIC METABOLIC PANEL
Anion Gap: 9 (ref 4–12)
Calcium: 8.3 mg/dL — ABNORMAL LOW (ref 8.9–10.2)
Carbon Dioxide, Total: 24 meq/L (ref 22–32)
Chloride: 107 meq/L (ref 98–108)
Creatinine: 0.45 mg/dL (ref 0.38–1.02)
Glucose: 107 mg/dL (ref 62–125)
Potassium: 4 meq/L (ref 3.6–5.2)
Sodium: 140 meq/L (ref 135–145)
Urea Nitrogen: 15 mg/dL (ref 8–21)
eGFR by CKD-EPI 2021: >60 mL/min/{1.73_m2} (ref >59)

## 2022-08-29 LAB — CBC (HEMOGRAM)
Hematocrit: 34 % — ABNORMAL LOW (ref 36.0–45.0)
Hemoglobin: 10.9 g/dL — ABNORMAL LOW (ref 11.5–15.5)
MCH: 32 pg (ref 27.3–33.6)
MCHC: 32 g/dL — ABNORMAL LOW (ref 32.2–36.5)
MCV: 100 fL — ABNORMAL HIGH (ref 81–98)
Platelet Count: 255 10*3/uL (ref 150–400)
RBC: 3.41 10*6/uL — ABNORMAL LOW (ref 3.80–5.00)
RDW-CV: 13.2 % (ref 11.0–14.5)
WBC: 9.63 10*3/uL (ref 4.3–10.0)

## 2022-08-29 MED ORDER — OMEPRAZOLE 20 MG OR CPDR
20.0000 mg | DELAYED_RELEASE_CAPSULE | Freq: Every day | ORAL | Status: DC
Start: 2022-08-29 — End: 2022-08-31
  Administered 2022-08-29 – 2022-08-31 (×3): 20 mg via ORAL
  Filled 2022-08-29 (×3): qty 1

## 2022-08-29 NOTE — Nursing Note (Signed)
Patient Summary  Autumn Camacho is a 56 year old female with RA who is s/p C7-T1 ACDF 11/3 who presents for worsening BLE weakness/numbness/tingling along since last night. POD2 revision laminectomy    A&Ox4. VSS on RA. Pain managed w/ prn tylenol and prn oxy 10 mg. Surgical incision to posterior neck covered with primapore, CDI. 1 HV drain to R posterior neck with bloody drainage. BLE 4/5 strength in bed. Denies N/T Voiding in [facility]. LBM 11/8 - senna and psyllium given. 1P CGA w/ FWW.    Illness Severity  Stable

## 2022-08-29 NOTE — Nursing Note (Signed)
Patient Summary  Autumn Camacho is a 56 year old female with RA who is s/p C7-T1 ACDF 11/3 who presents for worsening BLE weakness/numbness/tingling along since last night. POD2 revision laminectomy    A&Ox4. VSS on RA. Pain managed w/ prn tylenol and prn oxy 10 mg. Surgical incision to posterior neck covered with primapore, CDI. 1 HV drain to R posterior neck with bloody drainage. Denies N/T. Voiding in bathroom. LBM 11/8 - senna and psyllium given. 1P CGA w/ FWW. Worked with PT/OT. Standing XR done. This AM, had new tingling to toes since post-op, improved over the day        Illness Severity  Stable

## 2022-08-29 NOTE — Discharge Instructions (Addendum)
INSTRUCTIONS FOR AFTER YOUR SPINE SURGERIES:  --No NSAIDs (Ibuprofen, Aleve, Advil, Naproxen, Diclofenac, Motrin, etc) for 6 weeks after surgery  --Call for any redness, discharge or opening from your wound  --Call for increased swelling around your wound.  --Ok to shower 48 hours after surgery, use regular shampoo, avoid active scrubbing of the surgical incision  --Avoid soaking incision until cleared by Neurosurgery (no bath, pools, hot tubs)   --Keep incision open to air, do not apply any ointments   --Expect to have new nerve pain in a similar distribution following surgery. This pain should greatly improve over the first 2-3 weeks. Please call clinic with concerns  --If you were discharged in a brace you must wear when upright for 6 weeks post op. You do not need to wear your brace while lying or sleeping. You may remove for short periods for showering.     Contact the Neurosurgery team if you develop any of the following:   Severe or unusual headache    Nausea and vomiting    Temperature over 101.5    Problems with balance or falls    Purulent discharge from wound    Stroke-like symptoms such as facial drop, weakness, numbness.    New extremity weakness    Loss of bowel and bladder control    Personality changes, confusion, memory problems, seizures   Call the Clinic Monday-Friday during business hours at [phone]  After business hours, weekends or holidays call [phone]    Activity:   --You are restricted from heavy lifting for 3 months post op.   Month 1: Do not lift, pull, push objects weighing over 10 pounds  Month 2: Do not lift, pull, push objects weighing over 20 pounds  Month 3: Do not lift, pull, push objects weighing over 30 pounds  --Frequent walking is encouraged  --Avoid repetitive bending or twisting of spine  --Avoid high impact activities (running, contact sports, etc.) until cleared by Neurosurgery  --You may drive once off narcotic pain medications

## 2022-08-29 NOTE — Progress Notes (Signed)
Physical Therapy  Physical Therapy Treatment    Autumn Camacho  [id number]     08/29/2022  Duration of session: 25 Minutes    Subjective  Patient Report/Self-Assessment: eager for PT; feels she is stronger   Patient Stated Goal(s): wants to walk     Home Living  Type of Home: Single level home  Entry Stairs: 13  Indoor Stairs: none  Lives With: Spouse  Assistance Available at Home : Intermittent assistance  Mobility Equipment Owned: Front wheeled walker, Manual wheelchair    Functional Level:  Bed Mobility  Supine to Sit: Independent    Transfers  Sit<>Stand: Minimum Assist (less than 25% help needed);Assistive device used  Sit<>Stand Assistive Device Details: Front wheeled walker  Stand <> Sit: Contact guard assist (touching/steadying assistance)     Ambulation  Distance: 100'  Assistance Needs: Contact guard assist (touching/steadying assistance)  Assistive Device Used: Front wheeled walker  Gait quality/descriptors: Wide BOS;Decreased step length;Decreased foot clearance    Stairs  Number of steps: 4 steps  Style: Step to  Assistance Needs: Contact guard assist (touching/steadying assistance)  Assistive Device Used: 2 railings           Interventions:  Therex:                            Theract: Up in chair at beginning of session, needing 10% assist to complete stand after multiple attempts on her own. Placed foam block on chair following this to assist with future stands. Notably, needs to pull on supported FWW with both hands to produce stand.     With FWW able to ambulate slowly to the training steps. Navigating 4 steps with bilateral handrail use, CGA, and effort from pt to complete. Walking about 100' in total this session until back in room and needing set up assist to use bathroom. Discussed intensity of IPR and pro/con discussion with pt comparing it to SNF rehabilitation. Assisted back to chair, call light/needs in reach.                    Balance/Neuro Re-ed:                             Gait  Training:                                           ASSESSMENT/PLAN     Corrina working with PT today following concerns of returning home and having another acute decline in her functional mobility. Effortful to complete today's tasks. When asked about IPR candidacy this writer has concerns on if patient could tolerate back to back days of intensive rehabilitation given her deconditioning, rheumatoid arthritis impact on activity tolerance, and overall tolerance to today's session. Discussed with her SNF rehabilitation and assured here that a lot of SNF stays are short stays with goal of returning home, pt was receptive to this. Assessments ongoing by medical team regarding optimal DC location options.      Barriers to Discharge: limited ambulation distance, stairs to enter home           PT Discharge Equipment: FWW - has own       Goals:  Goal: Transfer with AD, Independent  Estimated Completion Date: 09/03/22   Goal: Amb with AD,  Supervision / Stand by assistance  Estimated Completion Date: 09/03/22   Goal: Stairs, Contact guard assist (touching/steadying assistance)  Estimated Completion Date: 09/03/22  Goal: AMPAC>20  Estimated Completion Date: 09/03/22       PT Plan of Care:  Treatment/Interventions: Therapeutic functional activities, Gait/Stairs training, Therapeutic exercise     PT Plan: Skilled PT     PT Dosage: 3-5x/week     Plan for Next Session: increase ambulation endurance      Other Recommendations:  PT Discharge Recommendations: Follow up with PT at next level of care (to be determined by primary team)   PT Daily Recommendations: OOB to chair with 1 PA, [place], [place], PT

## 2022-08-29 NOTE — Progress Notes (Signed)
Occupational Therapy Evaluation and Treatment Note    Patient Name: Autumn Camacho  MRN: [id number]  Today's Date: 08/29/2022  Total Treatment time: 12 [place] Living  Type of Home: Single level home  Entry Stairs: [place]: none  Lives With: Spouse;Daughter  Lives with Comments: dtr is 38, pt reports highly functioning with ASD  Assistance Available at Home : Intermittent assistance  Bathroom Shower/Tub: Tub/shower unit;Walk-in shower (remodeling to have a walk in shower on main level)  Bathroom Toilet: ADA height  Mobility Equipment Owned: Front wheeled walker;Manual wheelchair  Museum/gallery conservator Owned: Built-in shower seat  Home Living Comments: has full apartment in basement/first level that dtr stays in, pt will stay downstairs with dtr until able to easily manage stairs     Prior Function  Prior Function  BADLs: Requires assist  IADLs: Requires assist  Functional Mobility: Independent  Leisure and Hobbies: likes reading historical fiction, watching reality shows  Details of previous level of function: Husband assists with showering and some iADLs due to impairments from RA. Pt is able to drive and provides transportation for her daughter normally.     ADL and Functional Transfer Status reflective of last 24 hours      Eating Comments: did not observe set up, but independent to self-feed with spoon and fork   Grooming: Supervision / Stand by assistance   Grooming Comments: standing at sink to wash face, sat in chair to comb hair   Oral Hygiene: Supervision / Stand by assistance    Oral Hygiene Comments: standing at sink   Upper Body Dressing: Set up or clean up assistance  UE Dressing Comments: don/doff bathrobe             Putting on/taking off footwear: Dependent (greater than 75% help needed)     Putting on/taking off footwear Comment: reports that at home she uses a sock aid and reacher for lower body dressing and manages independently with tools   Toileting:  Supervision / Stand by assistance   Toileting Comments: indep hygiene, supervision when standing for clothing mgmt    Bathing: Moderate Assist (between 25-49% help needed)   Bathing Comments: Total assist for shampoo cap and reports her husband helps a little with showers at baseline. Set up for sponge bath- able to wash all aspects of body with just light help for washing around her primapore bandage           Transfers  Sit<>Stand: Assistive device used;Contact guard assist (touching/steadying assistance)  Sit<>Stand Assistive Device Details: Front wheeled walker  Cuing Used: Verbal  Bed<>Chair/WC: Contact guard assist (touching/steadying assistance);Assistive device used  Cuing Used: None  Bed<>Chair/WC Assistive Device Details: Front wheeled Theme park manager: Contact guard assist (touching/steadying assistance);Assistive device used  Transport planner Details: Front wheeled Theme park manager Comments: BSC placed over toilet     Functional Mobility  Distance: 150 ft  Assistance Needs: Contact guard assist (touching/steadying assistance)  Functional Mobility Comments: slow pace, antalgic gait (L knee pain), pt reports baseline gait abnormalities due to RA      Education on spinal precautions and strategies for managing ADL/iADL within precautions. Reviewed pacing and progressive return to activity, recommending more frequent shorter duration mobilizing. Reviewed follow up therapy options.     See flowsheet for objective data reflective of today's session.    Assessment  Impact on Function and Participation: Autumn Camacho had good participation with  full ADL routine, completing tasks with mostly supervision, occasional light assist. She was able to walk ~150 ft in the hallway with a walker and hands on guarding, demonstrating baseline gait abnormalities from RA but mostly steady. She reported new change in sensation to the balls of her feet today (numb, tingling, feet feeling  puffy) which made pt feel slightly less steady than her baseline mobility from a month ago. Pt reports she needs to be fully independent with mobility and toileting to d/c home, as her husband works full time. Recommended pt get up multiple times a day for short walks with nursing to help increase her tolerance and strength.    Barriers to Discharge: needs to be fully independent with toileting/mobility- anticipate pt will meet this goal with ~2 additional OT sessions and regular OOB activity     Evaluation/Treatment Tolerance: Patient tolerated treatment well   OT Coverage for Next Shift: toileting, shower training, functional mobility       Discharge Recommendations  OT Discharge Recommendations: Follow up with OT at next level of care   Assistance with : ADLs, IADLs                Goals   Goal: Upper body dressing, Lower body dressing, Independent  Estimated Completion Date: 09/07/22  Goal Status: Progressing     Goal: Toileting, Toilet transfer, Independent  Estimated Completion Date: 09/07/22  Goal Status: Progressing     Goal: Bathing, Tub/shower transfer, Minimum Assist (less than 25% help needed)  Estimated Completion Date: 09/07/22  Goal Status: [place], OT  08/29/2022

## 2022-08-29 NOTE — Progress Notes (Addendum)
Progress Note    56 year old female // [G] BLE wk -> C7 lami [s/p C7-T1 ACDF 11/3 AA,RA]  Edited by: Irma Newness, MD at 08/27/2022 1603    -NAEO    Exam:  A&Ox3  PERRL, EOMI, FS    R L  UPPER  D  5 5  B (C5-6) 5 5  T (C7-8) 4+ 5  WE (C6) 5 5  FDP (C8) 5 5  ADM (T1) 5 5  ---  LOWER  IP (L2-3) 5 5  Q (L4)  5 5  TA (L4-5) 5 5  G (S1)  5 5  EHL (L5) 5 5     Reflexes 2+ throughout  No clonus, hoffmans, toes down for babinski  Sensation grossly intact throughout    Plan:  [place] for Q4 NC   Diet - regular  BP goals - normotension  Imaging: UXR needs   Brace- none  Steroids: Dexamethasone taper to off over 3 days (Day 1/3)  Abx: [email or web address]  VTE PPX:  SCDs only  PT/OT  Consults:    Rheum - brief convo over phone regarding sx and hx, unlikely RA flare in such a short time span, if pain continues to worsen and is uncontrolled post-op, place consult order and they are happy to see pt  Suture removal: absorbable, none to remove  Dispo:  Pending --> IPR  consult today

## 2022-08-29 NOTE — Hospital Course (Addendum)
Per Dr. Graceann Congress H&P: Autumn Camacho is a 56 year old female who had "acute BLE weakness/numbness/tingling along with urinary/stool incontinence 11/2 and was taken for emergent C7-T1 ACDF 11/3 given concern for cauda equina. She did well post op and had improvement in her strength. Discharged 11/5 in stable condition." Unfortunately, she returned to the ED on 08/25/22 due to worsened B LE weakness, numbness, paresthesias, and gait difficulties.     She underwent C7 laminectomy With Dr. Reesa Chew 08/27/22. She tolerated the procedure and anesthesia well and was admitted to the neurosurgery acute care floor for monitoring. She remained neurologically and hemodynamically stable. Mobilized well with PT/OT, who recommended to discharge to home with home health PT. They were tolerating oral intake, voiding spontaneously, and pain was controlled. Patient met medical criteria on 08/31/22, and was discharged to home.     Please hold leflunomide  at least 2 weeks post-op and follow up with your rheumatologist on when to restart (after 11/23).    Sutures are resorbable but patient will be scheduled for 2 week follow up for staple removal with outpatient neurosurgery clinic. Please check your myChart for this date.

## 2022-08-30 ENCOUNTER — Other Ambulatory Visit (HOSPITAL_BASED_OUTPATIENT_CLINIC_OR_DEPARTMENT_OTHER): Payer: Self-pay

## 2022-08-30 LAB — BASIC METABOLIC PANEL
Anion Gap: 7 (ref 4–12)
Calcium: 8 mg/dL — ABNORMAL LOW (ref 8.9–10.2)
Carbon Dioxide, Total: 25 meq/L (ref 22–32)
Chloride: 107 meq/L (ref 98–108)
Creatinine: 0.38 mg/dL (ref 0.38–1.02)
Glucose: 104 mg/dL (ref 62–125)
Potassium: 3.9 meq/L (ref 3.6–5.2)
Sodium: 139 meq/L (ref 135–145)
Urea Nitrogen: 15 mg/dL (ref 8–21)
eGFR by CKD-EPI 2021: >60 mL/min/{1.73_m2} (ref >59)

## 2022-08-30 LAB — CBC (HEMOGRAM)
Hematocrit: 34 % — ABNORMAL LOW (ref 36.0–45.0)
Hemoglobin: 10.9 g/dL — ABNORMAL LOW (ref 11.5–15.5)
MCH: 32.2 pg (ref 27.3–33.6)
MCHC: 32.3 g/dL (ref 32.2–36.5)
MCV: 99 fL — ABNORMAL HIGH (ref 81–98)
Platelet Count: 237 10*3/uL (ref 150–400)
RBC: 3.39 10*6/uL — ABNORMAL LOW (ref 3.80–5.00)
RDW-CV: 12.9 % (ref 11.0–14.5)
WBC: 9.39 10*3/uL (ref 4.3–10.0)

## 2022-08-30 NOTE — Nursing Note (Signed)
Patient Summary  Autumn Camacho is a 56 year old female with RA who is s/p C7-T1 ACDF 11/3 who presents for worsening BLE weakness/numbness/tingling along since last night. POD3 revision laminectomy    A&Ox4. VSS on RA. Pain managed w/ prn tylenol and prn oxy 10 mg. Surgical incision to posterior neck covered with primapore, CDI. 1 HV drain to R posterior neck removed 11/12. Denies N/T. Voiding in bathroom. LBM 11/12 - senna and psyllium given. 1P CGA w/ FWW. Worked with PT/OT. Standing XR done. This AM, had new tingling to toes since post-op, improved over the day    Edited by: Orpah Cobb, RN at 08/30/2022 1312    Illness Severity  Stable  Edited by: Morene Antu, RN at 08/27/2022 [phone]

## 2022-08-30 NOTE — Nursing Note (Signed)
Patient Summary  Autumn Camacho is a 56 year old female with RA who is s/p C7-T1 ACDF 11/3 who presents for worsening BLE weakness/numbness/tingling along since last night. POD3 revision laminectomy    A&Ox4. VSS on RA. Pain managed w/ prn tylenol and prn oxy 10 mg. Surgical incision to posterior neck covered with primapore, CDI. 1 HV drain to R posterior neck with bloody drainage. Denies N/T. Voiding in bathroom. LBM 11/8 - senna and psyllium given. 1P CGA w/ FWW. Worked with PT/OT. Standing XR done. This AM, had new tingling to toes since post-op, improved over the day    Illness Severity  Stable

## 2022-08-30 NOTE — Progress Notes (Signed)
Neurosurgery Progress Note    56 year old female // [G] BLE wk -> C7 lami [s/p C7-T1 ACDF 11/3 AA,RA]  Edited by: Irma Newness, MD at 08/27/2022 1603    NAEON. Worked with therapies yesterday, progressing. IPR consult tomorrow. HV with 25 out. XR complete with intact hardware and alignment.    Exam:  AOx3    R          L  UPPER  D                      5          5  B (C5-6)          5          5  T (C7-8)           4+        5  WE (C6)          5          5  FDP (C8)         5          5  ADM (T1)        5          5  ---  LOWER  IP (L2-3)          5          5  Q (L4)              5          5  TA (L4-5)         5          5  G (S1)              5          5  EHL (L5)          5          5     Plan:  NSG Plan  Floor for Q4 NC   Diet - regular  BP goals - normotension  Imaging: upright XRs complete with intact hardware, alignment  Brace- none  Steroids: Dexamethasone taper to off over 3 days (Day 2/3)  Abx: [id number]  VTE PPX:  SCDs only  PT/OT  Consults:    Rheum - brief convo over phone regarding sx and hx, unlikely RA flare in such a short time span, if pain continues to worsen and is uncontrolled post-op, place consult order and they are happy to see pt  Suture removal: absorbable, none to remove  Dispo:  Pending --> IPR  consult Monday

## 2022-08-30 NOTE — Progress Notes (Signed)
Occupational Therapy Note       Patient Name: Autumn Camacho  MRN: [id number]  Today's Date: 08/30/2022  Total Treatment time: 28 Minutes     ADL and Functional Transfer Status reflective of last 24 hours          Grooming: Supervision / Stand by assistance   Grooming Comments: Standing at sink with FWW to wash hands           Upper Body Dressing: Set up or clean up assistance   UE Dressing Comments: don/doff bathrobe                      Toileting: Supervision / Stand by assistance                        Transfers  Sit<>Stand: Supervision / Stand by [employer] device used  Development worker, community Details: Risk analyst: Supervision / Stand by Medical illustrator Details: Grab bars;Front wheeled Scientist, product/process development Transfers Comments: has grab bar from home, standing from low toilet     Functional Mobility  Distance: 120Feet + 60 feet  Assistance Needs: Supervision / Stand by assistance;Assistive device used  Nurse, children's Details: Front wheeled walker  Functional Mobility Comments: Pt mindful while walking d/t baseline safety considerations. rest break in between and then ambulated with husbands assistance second time      Discussed safety plan for home when husband needs to leave. Patient and husband identified a neighbor who could be on call or come over as needed when husband is out     See flowsheet for objective data reflective of today's session.    Assessment  Impact on Function and Participation: Vallie continues to make good progress towards goals to DC home. She was able to get on and off the low toilet with just supervision today and ambulate 2 times during session with OT and then husbands assistance. Identified safety plan for home for assistance needed. Anticipate cotinued progress.     Barriers to Discharge: 6 STE down   Evaluation/Treatment Tolerance: Patient tolerated treatment well   OT Coverage for  Next Shift: shower training       Discharge Recommendations  OT Discharge Recommendations: Home health OT, Follow up with OT at next level of care   Assistance with : ADLs, IADLs                Goals   Goal: Upper body dressing, Lower body dressing, Independent  Estimated Completion Date: 09/07/22  Goal Status: Progressing     Goal: Toileting, Toilet transfer, Independent  Estimated Completion Date: 09/07/22  Goal Status: Progressing     Goal: Bathing, Tub/shower transfer, Minimum Assist (less than 25% help needed)  Estimated Completion Date: 09/07/22  Goal Status: [place], OT  08/30/2022

## 2022-08-31 ENCOUNTER — Encounter (HOSPITAL_BASED_OUTPATIENT_CLINIC_OR_DEPARTMENT_OTHER): Payer: Self-pay | Admitting: Rheumatology

## 2022-08-31 ENCOUNTER — Other Ambulatory Visit (HOSPITAL_BASED_OUTPATIENT_CLINIC_OR_DEPARTMENT_OTHER): Payer: Self-pay

## 2022-08-31 DIAGNOSIS — Z48811 Encounter for surgical aftercare following surgery on the nervous system: Secondary | ICD-10-CM

## 2022-08-31 LAB — BASIC METABOLIC PANEL
Anion Gap: 9 (ref 4–12)
Calcium: 8.4 mg/dL — ABNORMAL LOW (ref 8.9–10.2)
Carbon Dioxide, Total: 24 meq/L (ref 22–32)
Chloride: 107 meq/L (ref 98–108)
Creatinine: 0.49 mg/dL (ref 0.38–1.02)
Glucose: 93 mg/dL (ref 62–125)
Potassium: 3.9 meq/L (ref 3.6–5.2)
Sodium: 140 meq/L (ref 135–145)
Urea Nitrogen: 17 mg/dL (ref 8–21)
eGFR by CKD-EPI 2021: >60 mL/min/{1.73_m2} (ref >59)

## 2022-08-31 LAB — COVID-19 CORONAVIRUS QUALITATIVE PCR: COVID-19 Coronavirus Qual PCR Result: NOT DETECTED

## 2022-08-31 LAB — CBC (HEMOGRAM)
Hematocrit: 36 % (ref 36.0–45.0)
Hemoglobin: 11.3 g/dL — ABNORMAL LOW (ref 11.5–15.5)
MCH: 31.3 pg (ref 27.3–33.6)
MCHC: 31.2 g/dL — ABNORMAL LOW (ref 32.2–36.5)
MCV: 100 fL — ABNORMAL HIGH (ref 81–98)
Platelet Count: 229 10*3/uL (ref 150–400)
RBC: 3.61 10*6/uL — ABNORMAL LOW (ref 3.80–5.00)
RDW-CV: 13.2 % (ref 11.0–14.5)
WBC: 9.5 10*3/uL (ref 4.3–10.0)

## 2022-08-31 MED ORDER — OXYCODONE HCL 5 MG OR TABS
5.0000 mg | ORAL_TABLET | ORAL | 0 refills | Status: DC | PRN
Start: 2022-08-31 — End: 2022-09-09
  Filled 2022-08-31: qty 30, 4d supply, fill #0

## 2022-08-31 NOTE — Progress Notes (Signed)
Neurosurgery Progress Note    56 year old female // [G] BLE wk -> C7 lami [s/p C7-T1 ACDF 11/3 AA,RA]  Edited by: Irma Newness, MD at 08/27/2022 1603    NAEON  Worked with therapies yesterday, progressing to likely d/c home  HV d/c yesterday      Exam:  AOx3  R          L  UPPER  D                      5          5  B (C5-6)          5          5  T (C7-8)           4+        5  WE (C6)          5          5  FDP (C8)         5          5  ADM (T1)        5          5  ---  LOWER  IP (L2-3)          5          5  Q (L4)              5          5  TA (L4-5)         5          5  G (S1)              5          5  EHL (L5)          5          5       Plan:  NSG Plan  Floor for Q4 NC   Diet - regular  BP goals - normotension  Imaging: upright XRs complete with intact hardware, alignment  Brace- none  Steroids: Dexamethasone taper to off over 3 days (Day 2/3)  Abx: [id number]  VTE PPX:  SCDs only  PT/OT -> continuing to progress likely to d/c home  Consults:    Rheum - brief convo over phone regarding sx and hx, unlikely RA flare in such a short time span, if pain continues to worsen and is uncontrolled post-op, place consult order and they are happy to see pt  Suture removal: absorbable, none to remove  Dispo:  Pending --> likely home w/ therapies

## 2022-08-31 NOTE — Progress Notes (Signed)
Occupational Therapy Note       Patient Name: Autumn Camacho  MRN: [id number]  Today's Date: 08/31/2022  Total Treatment time: 39 Minutes     ADL and Functional Transfer Status reflective of last 24 hours  Eating: Independent       Grooming: Supervision / Stand by assistance   Grooming Comments: Standing at sink with FWW to wash hands           Upper Body Dressing: Minimum Assist (less than 25% help needed)   UE Dressing Comments: light assist to pull over head   Lower Body Dressing: Minimum Assist (less than 25% help needed)    LE Dressing Comments: very light assist to pull underwear over grippy socks    Putting on/taking off footwear: Maximum Assist (between 50-74% help needed)    Putting on/taking off footwear Comment: doffed indep, assist to don   Toileting: Independent        Bathing: Minimum Assist (less than 25% help needed)   Bathing Comments: assist to wash hair     Bed Mobility  Supine>Sit: Independent  Sit>Supine: Independent     Transfers  Sit<>Stand: Supervision / Stand by assistance  Sit<>Stand Assistive Device Details: Risk analyst: Teacher, adult education Details: Radio broadcast assistant Transfers Comments: has grab bar at home, standing from low toilet  Shower Transfer: Supervision / Stand by Market researcher: in room distances  Assistance Needs: Supervision / Stand by Sport and exercise psychologist Details: Front wheeled walker  Functional Mobility Comments: Pt mindful while walking d/t baseline safety considerations. rest break in between and then ambulated with husbands assistance second time      Pt able to complete ADL routine at baseline level - at home uses long handled tools for tasks and has light assist from husband with hair washing     See flowsheet for objective data reflective of today's session.    Assessment  Impact on Function and Participation: Devanny completed ADL routine  including a shower with up to 25% assist, which is her baseline. She has all needed DME and long handled tools for ADLs at home and is well supported by her family. Pt reports she is eager to return home with home health therapies, and has met all her acute care OT goals at this time.     Barriers to Discharge: none from OT standpoint   Evaluation/Treatment Tolerance: Patient tolerated treatment well   OT Coverage for Next Shift: shower training       Discharge Recommendations  OT Discharge Recommendations: Home health OT, Follow up with OT at next level of care   Assistance with : ADLs, IADLs                Goals   Goal: Upper body dressing, Lower body dressing, Independent  Estimated Completion Date: 09/07/22  Goal Status: Adequate for discharge     Goal: Toileting, Toilet transfer, Independent  Estimated Completion Date: 09/07/22  Goal Status: Met     Goal: Bathing, Tub/shower transfer, Minimum Assist (less than 25% help needed)  Estimated Completion Date: 09/07/22  Goal Status: Met                     Vergia Alberts, OT  08/31/2022

## 2022-08-31 NOTE — Consults (Signed)
Consult Note     Autumn Camacho") - DOB: [birth year] (56 year old female)  Gender Identity: Female  Preferred Pronouns: she/her/hers  Admit Date: 08/25/2022  Code Status: Full Code         Inpatient Consult to Physical Medicine Rehab  Consult performed by: Coralyn Mark, MD  Consult ordered by: Caleb Popp, MD        REHABILITATION MEDICINE CONSULTATION     Chief Complaint/Identification:   Autumn Camacho is a 56 year old female with a PMH of emergent C7-T1 ACDF (11/3) for concern for cauda equina syndrome due to BLE numbness/weakness with urinary/stool incontinence and discharged on 11/5, but had acute worsening BLE weakness/numbness starting 11/6, admitted for C7 laminectomy and inferior C6 laminotomy (11/9). Rehab medicine was consulted for IPR candidacy evaluation.    History of Present Illness:    Briefly, Autumn Camacho is a 56 year old female who initially presented with gait instability and weakness after a ground level fall a week prior to presentation and was found to have C7-T1 disk with cord signal change. There was a concern for cauda equina syndrome due to BLE numbness/weakness with urinary/stool incontinence and underwent emergent C7-T1 ACDF (11/3) and was discharged on 11/5. She presented again with acute worsening BLE weakness/numbness starting 11/6. MRI showed persistent moderate to severe canal narrowing at C7-T1 with cord compression and edema in the left hemicord. Patient underwent C7 laminectomy and inferior C6 laminotomy on 11/9.    Patient reports feeling well.  Patient reports being continent of bowel and bladder.  Patient denies any cognitive changes.  Patient denies any new acute pain other than from rheumatoid arthritis and the past right hip and bilateral knee replacement.  Patient believes that her strength is back to her baseline.  Patient strongly prefers to go home instead of IPR.    Review of Systems: Comprehensive 11 point ROS was performed and  otherwise negative except as noted in HPI.     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  Patient plans to use to basement.  Basement has a bathroom, kitchen, and the bedroom.  Indoor Stairs: none  Lives With: Spouse, daughter  Assistance Available at Home: Intermittent assistance Monday Wednesday Friday (alone from 8 AM to 1 PM Mondays days, but her next-door neighbor plans to help her during these times), 24/7 assistance on other days  Mobility Equipment Owned: Front wheeled walker, Manual wheelchair    Functional History:  Prior function:   Prior Function  BADLs: Requires assist (patient reports that she received help  when she had to perform ADLs quickly such as putting her footwear on)  IADLs: Requires assist (her daughter would help carrying groceries, but patient did the items in her fridge herself)  Functional Mobility: Independent  Leisure and Hobbies: likes reading historical fiction, watching reality shows  Details of previous level of function: Husband assists with showering and some iADLs due to impairments from RA. Pt is able to drive and provides transportation for her daughter normally.     Current function:  OT 08/31/2022  Eating: Independent       Grooming: Supervision / Stand by assistance   Grooming Comments: Standing at sink with FWW to wash hands       Upper Body Dressing: Minimum  Assist (less than 25% help needed)   UE Dressing Comments: light assist to pull over head   Lower Body Dressing: Minimum Assist (less than 25% help needed)    LE Dressing Comments: very light assist to pull underwear over grippy socks    Putting on/taking off footwear: Maximum Assist (between 50-74% help needed)    Putting on/taking off footwear Comment: doffed indep, assist to don             Toileting: Independent        Bathing: Minimum Assist (less than 25% help needed)           Bathing Comments: assist to wash hair              Bed Mobility  Supine>Sit: Independent  Sit>Supine: Independent      Transfers  Sit<>Stand: Supervision / Stand by assistance  Sit<>Stand Assistive Device Details: Risk analyst: Teacher, adult education Details: Radio broadcast assistant Transfers Comments: has grab bar at home, standing from low toilet  Shower Transfer: Supervision / Stand by Education administrator: in room distances  Assistance Needs: Supervision / Stand by Sport and exercise psychologist Details: Front wheeled walker  Functional Mobility Comments: Pt mindful while walking d/t baseline safety considerations. rest break in between and then ambulated with husbands assistance second time       Pt able to complete ADL routine at baseline level - at home uses long handled tools for tasks and has light assist from husband with hair washing     PT 08/31/2022  Bed Mobility  Supine to Sit: Independent    Transfers  Sit<>Stand: Independent  Sit<>Stand Assistive Device Details: Front wheeled walker  Stand <> Sit: Independent     Ambulation  Distance: 100'  Assistance Needs: Supervision / Stand by Electronics engineer Used: Front wheeled walker  Gait quality/descriptors: Wide BOS;Decreased step length;Decreased foot clearance    Stairs  Number of steps: 4  Style: Step to  Assistance Needs: Supervision / Stand by assistance  Assistive  Device Used: 2 railings      Allergies:   Fentanyl, Hydromorphone, Remicade [infliximab], Adhesives, Codeine, and Nortriptyline     Current Medications:    Current Facility-Administered Medications:     acetaminophen (Tylenol) tablet 650 mg, 650 mg, Oral, q4h PRN, El-Ghazali, Cristy Friedlander, MD, 650 mg at 08/31/22 0445    heparin injection 5,000 units, 5,000 units, Subcutaneous, q12h [facility], El-Ghazali, Cristy Friedlander, MD, 5,000 units at 08/31/22 0815    lidocaine (Lidoderm) 4 % per patch 2 patch, 2 patch, Transdermal, Once, Rich Brave, MD    melatonin tablet 3 mg, 3 mg, Oral, q HS PRN,  El-Ghazali, Cristy Friedlander, MD    omeprazole (PriLOSEC) DR capsule 20 mg, 20 mg, Oral, Daily empty stomach, Hartl, Kaitlyn, PA-C, 20 mg at 08/31/22 0445    ondansetron (Zofran) injection 4 mg, 4 mg, Intravenous, q8h PRN, El-Ghazali, Cristy Friedlander, MD    ondansetron (Zofran) tablet 4 mg, 4 mg, Oral, q8h PRN, El-Ghazali, Cristy Friedlander, MD    oxyCODONE tablet 5-10 mg, 5-10 mg, Oral, q3h PRN, El-Ghazali, Cristy Friedlander, MD, 5 mg at 08/31/22 0932    polyethylene glycol 3350 (Miralax) packet 17 g, 17 g, Oral, Daily PRN, El-Ghazali, Cristy Friedlander, MD    psyllium (Metamucil) packet 1 packet, 1 packet, Oral, BID PRN, El-Ghazali, Cristy Friedlander, MD, 1 packet at 08/30/22 6712    senna (Senokot) tablet 17.2 mg, 17.2 mg, Oral, BID PRN, Irma Newness, MD, 17.2 mg at 08/30/22 4580     Physical Exam:  Temp: 36.4 C  Pulse: 70  BP: 121/78  Resp: 16  SpO2: 94 %    GEN: NAD, alert   HEENT: Sclera non-icteric, conjugate gaze, Oropharynx clear, moist mucus membranes  CV:  Extremities warm, no LE edema   RESP: Breathing comfortably on room air  GI: soft non-distended   SKIN: Multiple bruises on her bilateral hands (patient reports that she was on NSAIDs and she tends to get bruised easily.)  MSK: Ulnar deviation of bilateral hands at the MCP    Strength: (MMT scored out of 5)    Right  Left  Sh abd  5  5  Elbow flex 5  5  Elbow ext   5  5  Wrist ext  5  5  Finger flex  5  5  ADM  5  5  APB  5  5  -----------------------------------------------------  Hip flex  5  5   Knee flex 5  5  Knee ext  5  5  ADF  5  5  APF   5  5      Reflexes:  (R/L)  Biceps   1+/1+  BR   1+/1+    Patella   1+/3+  Achilles  1+/1+  Hoffman's:  Neg/neg   Clonus:  0 bts / 0 bts   Babinski:  Down/down     Sensory:   Light touch: intact to light touch in b/l upper and lower extremities       Labs   Results from last 7 days   Lab Units 08/31/22  0353 08/30/22  0323 08/29/22  0427   WBC 10*3/uL 9.50 9.39 9.63   HB g/dL 11.3* 10.9* 10.9*   HCT % 36 34* 34*   PLT 10*3/uL 229 237 255   NA meq/L 140 139 140   K meq/L 3.9 3.9 4.0   CL meq/L 107 107 107   CO2 meq/L '24 25 24   '$ BUN mg/dL '17 15 15   '$ CRE mg/dL 0.49 0.38 0.45   CA mg/dL 8.4* 8.0* 8.3*       Imaging:  XR C Spine 2-3 View   Final Result by Margot Ables, MD (11/11 1559)   FINDINGS/IMPRESSION:   The patient is status post remote C7-T1 ACDF and recent C7 laminectomy and partial C6 laminotomy, as before. Hardware construct is intact without evidence of failure or loosening. Posterior paraspinal soft tissue swelling and overlying skin staples are present presumably related to recent surgery. Posterior drainage catheter seen in place.      Alignment appears grossly unchanged including multilevel spondylolisthesis which is  presumably degenerative in etiology. Prominence of the atlantodental interval measures 4 mm on the current study.      No acute fracture or traumatic subluxation.  Prevertebral soft tissues are grossly unchanged.      Multilevel degenerative changes are present including loss of disc height, associated endplate sclerosis, and  disc osteophyte complexes, most significant at C3-4 and C6-7, as before.                    XR Fluoro Up To 1 Hr   Final Result by Marolyn Hammock, RT (11/09 1439)      XR C Spine 2-3 Vw   Final Result by Damaris Hippo, MD (11/09 1623)      A single limited intraoperative  radiograph of the lower cervical spine on lateral projection, demonstrating C7 laminectomy in progress. Please refer to the operative note for details.      FINAL REPORT: Agree with above.           I have personally reviewed the images and agree with the report (or as edited).      XR C Spine 4-5 View   Final Result by Marlena Clipper, MD (11/08 1543)      Status post C7-T1 intervertebral hardware placement. Suboptimal evaluation of alignment and dynamic instability at the cervicothoracic junction due to obscuration by overlying shoulder shadows.       Unchanged widening of the atlantodental interval and anterior subluxation of C1 on C2 in unit. Findings are more accentuated on flexion position (9 mm) compared to neutral or extension position (6-7 mm), suggestive of dynamic instability. Unchanged minimal grade 1 anterolisthesis are this is of C5 on C6.      The vertebral bodies are normal in height. Unchanged intervertebral disc space narrowing at C3-4 and C6-7 levels with associated endplate irregularity, marginal osteophytosis, and uncovertebral hypertrophy. Multilevel facet arthrosis. The prevertebral soft tissues are unchanged.      I have personally reviewed the images and agree with the report (or as edited).      MRI C-T-L Spine w wo Contrast   Final Result by Margot Ables, MD (11/08 1538)   1.  Suggestion of focal enhancement of the descending cauda equina nerve roots at the level L2-3. 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   2.  Otherwise, expected postoperative changes related to C7-T1 anterior spinal fusion. There is persistent moderate to severe canal narrowing at C7-T1 with cord compression and edema in the left hemicord. No abnormal enhancement within the cervicothoracic cord or spinal canal.   3.  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      Dr. Lilli Few conveyed the findings of this exam to Dr. Jasper Riling over the phone at time of interpretation.      Preliminary report by neuroradiology fellow; final report by the attending neuroradiologist will follow.            ATTENDING FINAL REPORT      FINDINGS/IMPRESSION (final):   I agree with the preliminary report with the following comment(s):    Signal alteration is noted at the C0-C1 articulation, posterior arch of C1-C2 (image 7 of series 401), and posterior elements at C4-5, which are nonspecific and presumably degenerative in etiology.       There is approximate 6 mm anterior displacement  of the anterior arch of C1 relative to the dens (image 23 of series 1501) and similar anterior displacement of the posterior arch of C1 relative to the dens; this results in at least moderate to severe mass effect upon the upper cervical cord near the craniocervical junction (image 33 of series 1301).      There is suspected subtle enhancement versus artifact within the left hemicord, immediately inferior to the C7-T1 hardware (image 21 of series 1701), which at the level of signal alteration within the left hemicord.      +ct+      I have personally reviewed the images and agree with the report (or as edited).      CT C Spine wo Contrast   Final Result by Patrice Paradise, MD (11/07 1638)   1. No acute osseous abnormality.          Assessment:  Autumn Camacho is a 56 year old female with a PMH of emergent C7-T1 ACDF (11/3) for concern for cauda equina syndrome due to BLE numbness/weakness with urinary/stool incontinence and discharged on 11/5, but had acute worsening BLE weakness/numbness starting 11/6, admitted for C7 laminectomy and inferior C6 laminotomy (11/9). Rehab medicine was consulted for IPR candidacy evaluation.    Rehabilitation Medicine Recommendations:    #Disposition  Patient is not a good rehab candidate. Patient reports that she would  prefer going home with home health PT/OT.  Patient is independent with bed mobility and transfers.  Patient was also able to walk 100 feet with a walker with supervision only.  The patient was able to work on steps with supervision as well.  Patient does need some minimum assistance with dressing and bathing, but anticipate that she will be able to get the assistance needed at home.  Patient is also independent with toileting.  Given patient preference and her functional level at this time, we recommend discharging patient home with home health therapies.  - Recommend PVR to rule out urinary retention    -Follow Up: If the patient is discharged from the hospital please ensure appropriate rehabilitation medicine follow up. Place an outpatient rehabilitation medicine clinic consult for patient to see a General provider for next available appointment after patient has been home/at facility for 4 to 6 weeks.    Thank you for the opportunity to be involved in the care of this patient.  Please feel free to call with questions or concerns (via rehabilitation medicine consult pager or by the rehabilitation medicine resident listed in [place] on weekdays). We will continue to follow with you regarding rehabilitation needs.

## 2022-08-31 NOTE — Progress Notes (Signed)
Initial Inpatient Nutrition Screen    PI  56 year old female // [G] BLE wk -> C7 lami [s/p C7-T1 ACDF 11/3 AA,RA]    Height  Height: 154.9 cm ('5\' 1"'$ )    Weight  Admit Weight: 77.1 kg (170 lb) - Weight Method: Stated  Most Recent Weight: 81.7 kg (180 lb 1.9 oz) - Weight Method: Bed scale    Reported Usual Body Weight (UBW): 170 lbs    Historical Data  Wt Readings from Last 15 Encounters:   08/26/22 81.7 kg (180 lb 1.9 oz)   08/20/22 78 kg (172 lb)   07/20/22 77.1 kg (170 lb)   06/09/22 77.1 kg (170 lb)   02/24/22 76.7 kg (169 lb)   02/11/22 78.1 kg (172 lb 3.2 oz)   08/14/21 78 kg (172 lb)   08/08/21 77 kg (169 lb 12.1 oz)   04/16/21 79.4 kg (175 lb)   08/08/20 81.3 kg (179 lb 3.2 oz)   07/30/20 79.1 kg (174 lb 6.1 oz)   07/23/20 74.8 kg (165 lb)   06/27/20 78.7 kg (173 lb 8 oz)   06/14/20 74.8 kg (165 lb)   05/10/20 74.8 kg (165 lb)       Weight loss: No    BMI  Admit BMI (Calculated): 32.19 kg/m2  Most Recent: Body mass index is 34.05 kg/m.   BMI Classification: obese categ. I (30-34.9)    2.   BMI Less Than 18.5: No    Edema  3.   Edema Present: No    Wounds  4.   Wounds Present: Yes, neck incisions.    Nutritional Needs  Allergies:  Review of patient's allergies indicates:  Allergies   Allergen Reactions    Fentanyl Unknown and Other     Per patient, she has "forgotten to breathe"    Hydromorphone TZ:GYFVCB/SWHQPRFF    Remicade [Infliximab] Anaphylaxis    Adhesives Skin: Rash     Dermabond causes skin blistering    Codeine Unknown and Other     "Feels like her skin is crawling"  Extreme feeling of something crawling    Nortriptyline Unknown and Other     Heart palpitations         5.   Allergies Verified: Yes, no known food allergies.    Active Diet Orders   Diet    Adult Diet Regular     Active Nourishment Orders   There are no active orders of the following types: Nourishments.       6.   Special Dietary Needs: not assessed    Nutritional Intake  7.   Intake Prior to Admission: not applicable    Patient Vitals  for the past 168 hrs:   Percent Meals Eaten (%)   08/31/22 0821 75   08/30/22 0800 100   08/27/22 2045 100   08/26/22 1400 75     Reported eating very well; no nutritional questions or concerns.    8.  Nutritional Supplement Use: not applicable    Nutritional Risk Factors  No significant risk/needs identified.      INTERVENTIONS  Chart reviewed., Discussed food allergies/preferences/intolerances with patient., and Discussed current intake and tolerance with  patient.  To maintain nutritional status/weight.      PLAN  Monitor oral intake and tolerance.      Heather Roberts, DT

## 2022-08-31 NOTE — Nursing Note (Signed)
Patient Summary  Autumn Camacho is a 56 year old female with RA who is s/p C7-T1 ACDF 11/3 who presents for worsening BLE weakness/numbness/tingling along since last night. POD4 revision laminectomy    A&Ox4. VSS on RA. Pain managed w/ prn tylenol and prn oxy 5-'10mg'$ . Surgical incision to posterior neck covered with primapore, CDI. HV drain to R posterior neck removed 11/12. Denies N/T. Voiding in bathroom. LBM 11/12 - senna and psyllium given. 1P CGA w/ FWW. Worked with PT/OT. Standing XR done.     Illness Severity  Stable

## 2022-08-31 NOTE — Progress Notes (Signed)
Patient discharged home with family, all lines and drains discontinued, pt belonging send home with patient. Discharge instructions and teaching provided, all question answered and concerns addressed, pt verbalized understanding.

## 2022-08-31 NOTE — Discharge Summary (Signed)
Discharge Summary     [facility] [name]") - DOB: 01/30/1966 (56 year old female)  Gender Identity: Female  Preferred Pronouns: she/her/hers  PCP: Terressa Koyanagi, MD   Code Status: Full Code        DATE OF ADMISSION: 08/25/2022  DATE OF DISCHARGE: 08/31/22  DISCHARGE TEAM & ATTENDING: Neurosurgery & Caleb Popp, MD*     ADMISSION DIAGNOSIS: cervical stenosis   DISCHARGE DIAGNOSIS: cervical stenosis s/p C7 laminectomy     PROBLEMS ADDRESSED DURING THIS HOSPITALIZATION:   Principal Problem:    Weakness of both lower extremities  Active Problems:    Cervical stenosis of spinal canal  Resolved Problems:    * No resolved hospital problems. *      DISCHARGE FOLLOW-UP VISITS/APPOINTMENTS:    Upcoming appointments at [facility]:  Future Appointments   Date Time Provider [place]   09/03/2022 12:30 PM Bethann Humble, ARNP [facility] [place]   10/05/2022 11:40 AM Ardith Dark, MD [facility]   11/18/2022  1:00 PM Caleb Popp, MD [facility] NEUROSU        Additional follow-up:  No follow-up provider specified.    PENDING RESULTS THAT REQUIRE FOLLOW-UP (as of this summary):  Pending Labs       Order Current Status    COVID-19 Coronavirus Qualitative PCR Preliminary result            THERAPEUTIC RECOMMENDATIONS:   INSTRUCTIONS FOR AFTER YOUR SPINE SURGERIES:  --No NSAIDs (Ibuprofen, Aleve, Advil, Naproxen, Diclofenac, Motrin, etc) for 6 weeks after surgery  --Call for any redness, discharge or opening from your wound  --Call for increased swelling around your wound.  --Ok to shower 48 hours after surgery, use regular shampoo, avoid active scrubbing of the surgical incision  --Avoid soaking incision until cleared by Neurosurgery (no bath, pools, hot tubs)   --Keep incision open to air, do not apply any ointments   --Expect to have new nerve pain in a similar distribution following surgery. This pain should greatly improve over the first 2-3 weeks. Please call clinic with  concerns  --If you were discharged in a brace you must wear when upright for 6 weeks post op. You do not need to wear your brace while lying or sleeping. You may remove for short periods for showering.     Contact the Neurosurgery team if you develop any of the following:   Severe or unusual headache    Nausea and vomiting    Temperature over 101.5    Problems with balance or falls    Purulent discharge from wound    Stroke-like symptoms such as facial drop, weakness, numbness.    New extremity weakness    Loss of bowel and bladder control    Personality changes, confusion, memory problems, seizures   Call the Clinic Monday-Friday during business hours at [phone]  After business hours, weekends or holidays call [phone]    Activity:   --You are restricted from heavy lifting for 3 months post op.   Month 1: Do not lift, pull, push objects weighing over 10 pounds  Month 2: Do not lift, pull, push objects weighing over 20 pounds  Month 3: Do not lift, pull, push objects weighing over 30 pounds  --Frequent walking is encouraged  --Avoid repetitive bending or twisting of spine  --Avoid high impact activities (running, contact sports, etc.) until cleared by Neurosurgery  --You may drive once off narcotic pain medications      ALLERGIES:  Fentanyl, Hydromorphone, Remicade [infliximab], Adhesives, Codeine, and Nortriptyline      DISCHARGE MEDICATIONS:   Current Discharge Medication List        CONTINUE these medications which have CHANGED    Details   oxyCODONE 5 MG tablet Take 1 tablet (5 mg) by mouth every 3 hours as needed for moderate or severe pain.  Qty: 30 tablet, Refills: 0    Associated Diagnoses: Status post cervical discectomy           CONTINUE these medications which have NOT CHANGED    Details   acetaminophen 500 MG tablet Take 2 tablets (1,000 mg) by mouth every 6 hours as needed.      certolizumab (Cimzia Starter Kit) 6 X 200 MG/ML prefilled syringe kit Inject 2 syringes (400 mg) under  the skin initially, and at weeks 2 and 4 followed by 200 mg every other week  Qty: 3 each, Refills: 0    Associated Diagnoses: Rheumatoid arthritis involving multiple sites with positive rheumatoid factor (HCC)      certolizumab (Cimzia) 2 X 200 MG/ML prefilled syringe kit Inject 1 mL (200 mg) under the skin every 2 weeks.  Qty: 2 each, Refills: 1    Associated Diagnoses: Rheumatoid arthritis involving multiple sites with positive rheumatoid factor (HCC)      Fexofenadine HCl (ALLEGRA OR) Take 1 tablet by mouth daily.      gabapentin 300 MG capsule Take 1 capsule (300 mg) by mouth in the morning, 1 capsule (300 mg) in the afternoon, and 2 capsules (600 mg) before bedtime as directed.  Qty: 90 capsule, Refills: 0    Associated Diagnoses: Cervical stenosis of spinal canal      leflunomide 20 MG tablet TAKE 1 TABLET DAILY  Qty: 90 tablet, Refills: 1    Associated Diagnoses: Rheumatoid arthritis involving multiple sites with positive rheumatoid factor (HCC)      mupirocin 2 % ointment Apply 1 application  (1 g) topically daily. Apply to nose.      ondansetron 4 MG tablet Take 1 tablet (4 mg) by mouth every 8 hours as needed for nausea/vomiting.  Qty: 12 tablet, Refills: 0    Associated Diagnoses: Cervical stenosis of spinal canal      polyethylene glycol 3350 17 GM/SCOOP oral powder Fill cap with powder to the 17 gram mark and dissolve in 4 to 8 ounces of water. Take 17 g by mouth daily.  Qty: 476 g, Refills: 0    Associated Diagnoses: Cervical stenosis of spinal canal      psyllium 58.12 % packet Take 1 packet by mouth daily as needed for constipation. Mix in 8 oz of juice or water and drink.  Qty: 30 packet, Refills: 0    Associated Diagnoses: Cervical stenosis of spinal canal      senna 8.6 MG tablet Take 2 tablets (17.2 mg) by mouth 2 times a day.  Qty: 12 tablet, Refills: 0    Associated Diagnoses: Cervical stenosis of spinal canal      valACYclovir 1 g tablet Take 2 tablets (2,000 mg) by mouth every morning.            HOSPITAL COURSE:   Per Dr. Graceann Congress H&P: Autumn Camacho is a 56 year old female who had "acute BLE weakness/numbness/tingling along with urinary/stool incontinence 11/2 and was taken for emergent C7-T1 ACDF 11/3 given concern for cauda equina. She did well post op and had improvement in her strength. Discharged 11/5 in stable condition." Unfortunately, she  returned to the ED on 08/25/22 due to worsened B LE weakness, numbness, paresthesias, and gait difficulties.     She underwent C7 laminectomy With Dr. Reesa Chew 08/27/22. She tolerated the procedure and anesthesia well and was admitted to the neurosurgery acute care floor for monitoring. She remained neurologically and hemodynamically stable. Mobilized well with PT/OT, who recommended to discharge to home with home health PT. They were tolerating oral intake, voiding spontaneously, and pain was controlled. Patient met medical criteria on 08/31/22, and was discharged to home.     Please hold leflunomide  at least 2 weeks post-op and follow up with your rheumatologist on when to restart (after 11/23).    Sutures are resorbable but patient will be scheduled for 2 week follow up for staple removal with outpatient neurosurgery clinic. Please check your myChart for this date.      DISPOSITION: home     CONDITION: fair     CONSULTS COMPLETED:    IP CONSULT TO NEUROLOGY  IP CONSULT TO PHYSICAL MEDICINE REHAB     OPERATIONS/PROCEDURES:  Surgical/Procedural Cases on this Admission       Case IDs Date Procedure Surgeon Location Status    [phone] 08/27/22 Cervical 7 laminectomy, additional levels as needed Althia Forts, Anabel Bene, MD [facility] MAIN OR Comp            Additional procedures:  None     PRINCIPAL DIAGNOSTIC STUDIES/RESULTS:    Imaging Results:  XR C Spine 2-3 View  Narrative: EXAMINATION:  XR C SPINE 2-3 VW: Standing AP and lateral radiographs.    CLINICAL INDICATION:  AP and LAt s/p Lami     COMPARISON:   CT C-spine and C-spine radiographs both from August 25, 2022  Impression: FINDINGS/IMPRESSION:  The patient is status post remote C7-T1 ACDF and recent C7 laminectomy and partial C6 laminotomy, as before. Hardware construct is intact without evidence of failure or loosening. Posterior paraspinal soft tissue swelling and overlying skin staples are present presumably related to recent surgery. Posterior drainage catheter seen in place.    Alignment appears grossly unchanged including multilevel spondylolisthesis which is presumably degenerative in etiology. Prominence of the atlantodental interval measures 4 mm on the current study.    No acute fracture or traumatic subluxation.  Prevertebral soft tissues are grossly unchanged.    Multilevel degenerative changes are present including loss of disc height, associated endplate sclerosis, and  disc osteophyte complexes, most significant at C3-4 and C6-7, as before.                No discharge procedures on file.    DISCHARGE PHYSICAL EXAM:   Vitals (Most recent in last 24 hrs)     T: 36.3 C (08/31/22 1100)  BP: 101/77 (08/31/22 1100)  HR: 83 (08/31/22 1100)  RR: 18 (08/31/22 1100)  SpO2: 95 % (08/31/22 1100) Room air  T range: Temp  Min: 36.1 C  Max: 36.6 C  Admit weight: 77.1 kg (170 lb) (08/25/22 1328)  Last weight: 81.7 kg (180 lb 1.9 oz) (08/26/22 0135)       Physical Exam  Aox3  Posterior neck staples OTA, dry, intact  Anterior neck steri-strips C/D/I    R          L  UPPER  D                      5          5  B (C5-6)  5          5  T (C7-8)           4+        5  WE (C6)          5          5  FDP (C8)         5          5  ADM (T1)        5          5  ---  LOWER  IP (L2-3)          5          5  Q (L4)              5          5  TA (L4-5)         5          5  G (S1)              5          5  EHL (L5)          5          5            [facility] physicians mentioned in this note can be reached by calling the [facility] Paging Operator at [phone]. If any part of this transcript is missing or to  request other transcripts for this patient call [phone]. For online access to patient records enroll in [place] [facility] [email or web address].

## 2022-08-31 NOTE — Progress Notes (Signed)
Physical Therapy Treatment    Verina Galeno  [id number]     08/31/2022  Duration of session: 25 Minutes    Subjective  Patient Report/Self-Assessment: pleased with progress   Patient Stated Goal(s): wants to DC home with home PT     Home Living  Type of Home: Single level home  Entry Stairs: 13  Indoor Stairs: none  Lives With: Spouse  Assistance Available at Home : Intermittent assistance  Mobility Equipment Owned: Front wheeled walker, Manual wheelchair    Functional Level:  Bed Mobility  Supine to Sit: Independent    Transfers  Sit<>Stand: Independent  Development worker, community Details: Front wheeled walker  Stand <> Sit: Independent     Ambulation  Distance: 100'  Assistance Needs: Supervision / Stand by Electronics engineer Used: Front wheeled walker  Gait quality/descriptors: Wide BOS;Decreased step length;Decreased foot clearance    Stairs  Number of steps: 4  Style: Step to  Assistance Needs: Supervision / Stand by assistance  Assistive Device Used: 2 railings           Interventions:  Therex:                            Theract: transfers; sit<->.stand to FWW from EOB independent     ambulation; FWW, SBA slow pace, steady; 100'     stairs training; up and down 4 stairs with B railings, SBA     bed mobility; supine<->.sit independent        Balance/Neuro Re-ed:                             Gait Training:                                           ASSESSMENT/PLAN     Cece is progressing nicely and meeting PT goals for DC home. Today she is mobilizing in and out of bed independently and ambulating short hallway distance with FWW and stand-by. She is able to ascend and descend 4 stairs with railings with supervision. Anticipate DC home with home health when medically stable.      Barriers to Discharge: none       PT Discharge Equipment: FWW - has own       Goals:  Goal: Transfer with AD, Independent  Estimated Completion Date: 09/03/22  Goal Status: Met   Goal: Amb with AD, Supervision / Stand by  assistance  Estimated Completion Date: 09/03/22  Goal Status: Met   Goal: Stairs, Contact guard assist (touching/steadying assistance)  Estimated Completion Date: 09/03/22  Goal Status: Met  Goal: AMPAC>20  Estimated Completion Date: 09/03/22  Goal Status: Met     PT Plan of Care:  Treatment/Interventions: Therapeutic functional activities, Gait/Stairs training, Therapeutic exercise     PT Plan: No skilled PT/Discharge PT     PT Dosage: 3-5x/week     Plan for Next Session: DC PT      Other Recommendations:  PT Discharge Recommendations: Follow up with PT at next level of care (to be determined by primary team)   PT Daily Recommendations: ambulate with FWW household distance, independent    Arman Bogus, PT

## 2022-09-02 ENCOUNTER — Other Ambulatory Visit (HOSPITAL_BASED_OUTPATIENT_CLINIC_OR_DEPARTMENT_OTHER): Payer: Self-pay

## 2022-09-03 ENCOUNTER — Ambulatory Visit (HOSPITAL_BASED_OUTPATIENT_CLINIC_OR_DEPARTMENT_OTHER): Payer: Medicare HMO | Admitting: Registered Nurse

## 2022-09-09 ENCOUNTER — Ambulatory Visit: Payer: No Typology Code available for payment source | Attending: Physician Assistant | Admitting: Physician Assistant

## 2022-09-09 DIAGNOSIS — M4150 Other secondary scoliosis, site unspecified: Secondary | ICD-10-CM | POA: Insufficient documentation

## 2022-09-09 DIAGNOSIS — Z9889 Other specified postprocedural states: Secondary | ICD-10-CM | POA: Insufficient documentation

## 2022-09-09 MED ORDER — OXYCODONE HCL 5 MG OR TABS
5.0000 mg | ORAL_TABLET | ORAL | 0 refills | Status: DC | PRN
Start: 2022-09-09 — End: 2022-09-14

## 2022-09-09 NOTE — Progress Notes (Signed)
Neurosurgery outpatient post operative     History of Present Illness  Autumn Camacho is a 56 year old female who is here for a follow up appointment after she presented to the ED and underwent C7-T1 ACDF 11/3 given concern for cauda equina. She was discharged after 2 nights in the hospital. Unfortunately she presented to the ED on 08/25/22 due to worsening BLE weakness/numbness/paraesthesias and gait difficulties and underwent C7 laminectomy with Dr. Reesa Chew on 08/27/22. The surgery was uncomplicated and the patient was discharged home after 6 nights in the hospital.     Patient reports the post operative period has been going fine. She is not as secure on her feet but this is getting better, today she presents in a wheelchair but is ambulating short distances in her house. This is less than prior to surgery, but that is because prior to surgery she relied heavily on her arms on the walker for balance. Still getting arm strength back. Her incontinence has mostly gone away, still having groin numbness but not having accidents. She is staying on top of using the bathroom frequently, but she is not to the point of having urge yet, for urine only. When she has to have a BM, she feels it in her tailbone. They deny bleeding, purulence, swelling or erythema from the incisions. They have been showering and keeping incision clean. They deny fever, chills, feeling ill, uncontrolled pain, insomnia. They deny new onset of weakness, numbness, tingling, vision changes, seizures, HA or dizziness. They deny chest pain pressure or palpitations, SOB or leg pain and/or swelling. Denies N/V and constipation. She has RA and her rheumatologist is okay with her restarting her RA medication when we give her the okay. Overall happy with their recovery.       Medications  Current Outpatient Medications   Medication Sig Dispense Refill    acetaminophen 500 MG tablet Take 2 tablets (1,000 mg) by mouth every 6 hours as needed.       certolizumab (Cimzia Starter Kit) 6 X 200 MG/ML prefilled syringe kit Inject 2 syringes (400 mg) under the skin initially, and at weeks 2 and 4 followed by 200 mg every other week 3 each 0    certolizumab (Cimzia) 2 X 200 MG/ML prefilled syringe kit Inject 1 mL (200 mg) under the skin every 2 weeks. 2 each 1    Fexofenadine HCl (ALLEGRA OR) Take 1 tablet by mouth daily. (Patient not taking: Reported on 08/25/2022)      gabapentin 300 MG capsule Take 1 capsule (300 mg) by mouth in the morning, 1 capsule (300 mg) in the afternoon, and 2 capsules (600 mg) before bedtime as directed. 90 capsule 0    leflunomide 20 MG tablet TAKE 1 TABLET DAILY 90 tablet 1    mupirocin 2 % ointment Apply 1 application  (1 g) topically daily. Apply to nose.      ondansetron 4 MG tablet Take 1 tablet (4 mg) by mouth every 8 hours as needed for nausea/vomiting. 12 tablet 0    oxyCODONE 5 MG tablet Take 1 tablet (5 mg) by mouth every 3 hours as needed for moderate or severe pain. 30 tablet 0    polyethylene glycol 3350 17 GM/SCOOP oral powder Fill cap with powder to the 17 gram mark and dissolve in 4 to 8 ounces of water. Take 17 g by mouth daily. (Patient not taking: Reported on 08/25/2022) 476 g 0    psyllium 58.12 % packet Take 1 packet  by mouth daily as needed for constipation. Mix in 8 oz of juice or water and drink. 30 packet 0    senna 8.6 MG tablet Take 2 tablets (17.2 mg) by mouth 2 times a day. 12 tablet 0    valACYclovir 1 g tablet Take 2 tablets (2,000 mg) by mouth every morning.       No current facility-administered medications for this visit.     Physical Exam  BP (P) 124/79   Pulse (P) 94   Temp (P) 37.3 C (Temporal)   Ht (P) 5' 0.98" (1.549 m)   SpO2 (P) 93%   BMI (P) 34.05 kg/m   General Appearance: NAD  Skin: All staples are in place. No erythema, swelling, discharge from or dehiscence of the wound, appears to be healing well with good reapproximation in both anterior and posterior.   Mental status: Alert, cooperative  and oriented x 3, follows commands   CN: Pupils equal and round, EOMI, remaining CN grossly intact and symmetric   Upper extremities:   Motor Strength Right Left   Deltoid             5/5 5/5   Biceps 5/5 5/5   Triceps 5/5 5/5   Wrist flexion 5/5 5/5   Wrist extension 5/5 5/5   Grip 5/5 5/5   Finger Abduction 5/5 5/5   Sensation grossly intact to light touch intact and symmetric in b/l UE, did not test groin sensation but per patient decreased    Lower extremities:                                                                    Motor Strength Right Left   Hip Flexion 5/5 3-/5- baseline pain limited    Knee extension 5/5 5/5   Knee flexion  5/5 5/5   Plantar flexion 5/5 5/5   Dorsiflexion  5/5 5/5   EHL 5/5 5/5   Sensation grossly intact to light touch intact and symmetric, did not test groin sensation but per patient decreased    Spine: Normal alignment  Stance and gait:   In wheelchair, requires walked for ambulation   Psychiatric: Mood and affect are appropriate throughout the interview. Patient is able to cooperate well throughout the exam.    Assessment, Discussion and Plan   56 year old female who is here for a post op visit after undergoing C7-T1 ACDF 11/3 given concern for cauda equina. She was discharged after 2 nights in the hospital. Unfortunately she presented to the ED on 08/25/22 due to worsening BLE weakness/numbness/paraesthesias and gait difficulties and underwent C7 laminectomy with Dr. Reesa Chew on 08/27/22. She is doing well and recovering, We dicussed that the progress might be slow but to continue focusing on the positive progress. I encouraged ambulation as much as is safe and possible. She will work with [facility], and I referred her to PT. I think she is making good progress, but it will take some time and work for her to feel back to her baseline. I gave wound care instructions and encouraged patient to keep area clean and dry. No lotions or ointments at this time. The incisions look like they are  healing well, so I think it is okay to restart her  RA meds. She will watch the incision closely for s/s of infection. We discussed all patients questions and concerns. We will plan to see them in Dr. Latina Craver clinic at 12 weeks post op with imaging. They know they may contact us at any time with Questions or concerns.     Author:  Larwance Rote, PA-C   [facility]  [phone]

## 2022-09-14 ENCOUNTER — Other Ambulatory Visit (HOSPITAL_BASED_OUTPATIENT_CLINIC_OR_DEPARTMENT_OTHER): Payer: Self-pay | Admitting: Physician Assistant

## 2022-09-14 DIAGNOSIS — Z9889 Other specified postprocedural states: Secondary | ICD-10-CM

## 2022-09-14 MED ORDER — METHOCARBAMOL 500 MG OR TABS
500.0000 mg | ORAL_TABLET | Freq: Four times a day (QID) | ORAL | 0 refills | Status: DC | PRN
Start: 2022-09-14 — End: 2022-09-21

## 2022-09-14 MED ORDER — OXYCODONE HCL 5 MG OR TABS
5.0000 mg | ORAL_TABLET | ORAL | 0 refills | Status: DC | PRN
Start: 2022-09-14 — End: 2022-09-21

## 2022-09-14 NOTE — Telephone Encounter (Signed)
Update per Duffy Bruce, [place]:      RN called pt, relayed the above. Educated on importance of not taking oxycodone and methocarbamol at the same time and to take at least 2 hrs apart (per [place], [place] recommendations). Educated on SS of oversedation and when to seek emergent care/911 if concerned of this. Pt plans to start methocarbamol tomorrow when pt/spouse awake to see how tolerates. Encouraged pt to keep clinic updated, pt in agreement.

## 2022-09-14 NOTE — Telephone Encounter (Signed)
Hx: 56 yo female s/p emergent C7-T1 ACDF with Dr Reesa Chew 08/21/2022 for cauda equina, s/p C7 laminectomy with Dr Reesa Chew 08/25/2022 for BLE weakness/numbness/paraesthesias, gait difficulties.     Last OV: 09/09/2022 with Alinda Sierras, PA  Next OV: 11/18/2022 with Dr Reesa Chew    Situation: Patient provided 30 tabs of '5mg'$  oxycodone on 09/09/2022 for one tab q3 hours PRN. RN called patient, she is currently taking '5mg'$  oxycodone q 4 hours with good results, unable to extend to q5 hours yet. Tried extending the oxycodone to q 5 hours a few days ago, did well the first day but was in "excruciating pain", went back to q 4 hours yesterday evening.     Currently also taking:  - APAP '1000mg'$  q 6 hours (educated on being maxed out with APAP)  - has not tried ice or heat, patient amenable (precautions added such as avoiding incision area, do not apply directly to skin)  - does not have muscle relaxers prescribed but is open to this option     Plan: Routing refill to [place], [place] for review.    Katya Rolston, RN, BSN

## 2022-09-17 ENCOUNTER — Telehealth (HOSPITAL_BASED_OUTPATIENT_CLINIC_OR_DEPARTMENT_OTHER): Payer: Self-pay | Admitting: Neurological Surgery

## 2022-09-17 ENCOUNTER — Encounter (INDEPENDENT_AMBULATORY_CARE_PROVIDER_SITE_OTHER): Payer: Self-pay | Admitting: Neurological Surgery

## 2022-09-17 ENCOUNTER — Ambulatory Visit: Payer: No Typology Code available for payment source | Attending: Family | Admitting: Family

## 2022-09-17 DIAGNOSIS — D352 Benign neoplasm of pituitary gland: Secondary | ICD-10-CM | POA: Insufficient documentation

## 2022-09-17 DIAGNOSIS — M5136 Other intervertebral disc degeneration, lumbar region: Secondary | ICD-10-CM | POA: Insufficient documentation

## 2022-09-17 MED ORDER — METHYLPREDNISOLONE 4 MG OR TBPK
ORAL_TABLET | ORAL | 0 refills | Status: AC
Start: 2022-09-17 — End: 2022-09-22

## 2022-09-17 NOTE — Telephone Encounter (Signed)
Hx: 56 year old female underwent C7-T1 ACDF 11/3 given concern for cauda equina. She was discharged after 2 nights in the hospital. Unfortunately she presented to the ED on 08/25/22 due to worsening BLE weakness/numbness/paraesthesias and gait difficulties and underwent C7 laminectomy on 08/27/22.    Last OV: 09/09/2022 w/[place], PA  Next OV: 11/18/2022 w/Dr. Reesa Chew    Situation: Pt LVM on RN line noting getting up this AM and something "moved, slid and popped" in back - pt endorsing pain on VM. RN returned call, spoke w/pt who notes she stood up today and felt her low back "slipped" felt a pop sensation, and increased burning pain to back. Pt endorsing difficult time walking and standing up since incident d/t pain. Having hard time straightening L leg while laying down d/t pain and states muscles "cramp and seize up" when trying to straighten out leg. Back pain has increased upward on back, now affecting mid-lower (previously just low back). Ongoing numbness to R leg. Does not believe there is any new swelling/bulge to back. Pain is 7/10 currently, prior to incident pain was 3/10. Oxycodone taken last at 11AM, no relief to pain.     Of note, over the past week while needing to have BM pt endorses severe pain to lower back 10/10, after having BM pain is relieved, this has worsened since last OV. Denies new n/t, saddle anesthesia, new/sudden weakness or incontinence. Denies fall or balance changes.     RN placed pt call on hold and spoke in person w/Paredes, ARNP inquiring on plan. Paredes, ARNP recommending OV today for evaluation.     Plan: Spoke w/Paredes, ARNP recommends in-person appt today for eval. Pt in agreement w/this and can be here within the hour. Appt has been made.

## 2022-09-17 NOTE — Telephone Encounter (Signed)
Please see this RN triage in additional 09/17/2022 TE - pt added to schedule to be seen in clinic today (09/17/2022).     Closing this encounter.

## 2022-09-17 NOTE — Progress Notes (Signed)
CC  S/p C7 laminectomy with Dr. Reesa Chew on 08/27/22.     SUBJECTIVE    History of Present Illness    Per Alinda Sierras Daily note from 09/09/22  Autumn Camacho is a 56 year old female who is here for a follow up appointment after she presented to the ED and underwent C7-T1 ACDF 11/3 given concern for cauda equina. She was discharged after 2 nights in the hospital. Unfortunately she presented to the ED on 08/25/22 due to worsening BLE weakness/numbness/paraesthesias and gait difficulties and underwent C7 laminectomy with Dr. Reesa Chew on 08/27/22.       Pt called today complaining of a "pop" in her back after getting up. Since then has been presenting with low to mid back pain, described as burning pain, pain does not radiate. Denies any shooting pain in her buttocks or BLE.   Since last surgery she has been having low back pain when having a bowel movement, denies any incontinence of the urine or stool. Denies any urinary retention.     She has severe RA and is affecting mostly the L hip, she has been having a hard time  straightening L leg while laying down d/t pain and states muscles "cramp and seize up" when trying to straighten out leg      Patient denies any fever, chills, no sweats, no redness, no swelling, no opening from the wound.           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      certolizumab (Cimzia Starter Kit) 6 X 200 MG/ML prefilled syringe kit Inject 2 syringes (400 mg) under the skin initially, and at weeks 2 and 4 followed by 200 mg every other week 3 each 0     certolizumab (Cimzia) 2 X 200 MG/ML prefilled syringe kit Inject 1 mL (200 mg) under the skin every 2 weeks. 2 each 1    Fexofenadine HCl (ALLEGRA OR) Take 1 tablet by mouth daily. (Patient not taking: Reported on 09/17/2022)      gabapentin 300 MG capsule Take 1 capsule (300 mg) by mouth in the morning, 1 capsule (300 mg) in the afternoon, and 2 capsules (600 mg) before bedtime as directed. (Patient not taking: Reported on 09/09/2022) 90 capsule 0    leflunomide 20 MG tablet TAKE 1 TABLET DAILY 90 tablet 1    methocarbamol 500 MG tablet Take 1 tablet (500 mg) by mouth every 6 hours as needed for muscle spasms. 20 tablet 0    methylPREDNISolone 4 MG tablet dose pak Take 6 tablets (24 mg) by mouth daily for 1 day, THEN 5 tablets (20 mg) daily for 1 day, THEN 4 tablets (16 mg) daily for 1 day, THEN 3 tablets (12 mg) daily for 1 day, THEN 2 tablets (8 mg) daily for 1 day, THEN 1 tablet (4 mg) daily for 1 day. Or  take according to package directions. 21 tablet 0    mupirocin 2 % ointment Apply 1 application  (1 g) topically daily. Apply to nose.      ondansetron 4 MG tablet Take 1 tablet (4 mg) by mouth every 8 hours as needed for nausea/vomiting. (Patient not taking: Reported on 09/09/2022) 12 tablet 0    oxyCODONE 5 MG tablet Take 1 tablet (5 mg) by mouth every 3 hours as needed for moderate or severe pain. 30 tablet 0    polyethylene glycol 3350 17 GM/SCOOP oral powder Fill cap with powder to the 17 gram mark and dissolve in 4 to 8 ounces of water. Take 17 g by mouth daily. 476 g 0    psyllium 58.12 % packet Take 1 packet by mouth daily as needed for constipation. Mix in 8 oz of juice or water and drink. 30 packet 0    senna 8.6 MG tablet Take 2 tablets (17.2 mg) by mouth 2 times a day. 12 tablet 0    valACYclovir 1 g tablet Take 2 tablets (2,000 mg) by mouth every morning.       No current facility-administered medications for this visit.       Allergies  Review of patient's allergies indicates:  Allergies    Allergen Reactions    Fentanyl Unknown and Other     Per patient, she has "forgotten to breathe"    Hydromorphone QB:HALPFX/TKWIOXBD    Remicade [Infliximab] Anaphylaxis    Adhesives Skin: Rash     Dermabond causes skin blistering    Codeine Unknown and Other     "Feels like her skin is crawling"  Extreme feeling of something crawling    Nortriptyline Unknown and Other     Heart palpitations               OBJECTIVE    Physical Exam  PHYSICAL EXAM:  BP (P) 118/79   Pulse (P) 88   Temp (P) 37.3 C (Temporal)   Ht (P) 5' 0.98" (1.549 m)   SpO2 (P) 95%   BMI (P) 34.05 kg/m   General Appearance: Pleasant, NAD, WH.     Neurologic: Awake and oriented to person, place, event, time. Speech is clear and articulated. PEERLA. EOMI,     MOTOR STRENGTH RIGHT LEFT   Deltoid  5/5 5/5   Biceps 5/5 5/5   Triceps 5/5 5/5   Wrist flexion 5/5 5/5   Wrist extension 5/5 5/5   Grip 5/5 5/5   Finger Abduction 4/5 4/5                  MOTOR STRENGTH RIGHT LEFT   Hip Flexion 5/5 3/5 * due to pain    Knee extension 5/5 5/5   Knee flexion  5/5 5/5   Dorsiflexion 5/5 5/5   EHL 5/5 5/5   Plantar flexion 5/5 5/5       ASSESSMENT/PLAN  Autumn Camacho is a very pleasant 56 year old female s/p  C7 laminectomy with Dr. Reesa Chew on 08/27/22.   She has her baseline strength based on last assessment on 09/09/22. Due to her hx of severe RA and stenosis explained her symptoms. Recommended to start Medrol dose pack with food and follow up tomorrow morning.   If she presents with any sudden weakness or worsening pain then should give [place] a call and recommended to have MRI L spine   Patient was instructed about skin care and should keep incision clear and dry  at all times. In the event, patient presents with fever, chills, sweats, or any erythema, discharge or dehiscence from the wound should contact our clinic immediately. Otherwise, should follow up as scheduled.       Author:  Wallace Cullens, [place]  [facility]  [phone]

## 2022-09-18 ENCOUNTER — Telehealth (HOSPITAL_BASED_OUTPATIENT_CLINIC_OR_DEPARTMENT_OTHER): Payer: Self-pay | Admitting: Registered Nurse

## 2022-09-18 NOTE — Telephone Encounter (Signed)
I called Autumn Camacho today. She started the steroid pack last night, she feels her symptoms are a lot better now. Able to stand now without a lot of pain, denies new weakness, or incontinence of bladder. She has numbness down her right leg that is not new. I encouraged her to call us for red flag symptoms, or if she has any other questions or concerns.

## 2022-09-19 ENCOUNTER — Other Ambulatory Visit (HOSPITAL_BASED_OUTPATIENT_CLINIC_OR_DEPARTMENT_OTHER): Payer: Self-pay | Admitting: Physician Assistant

## 2022-09-19 DIAGNOSIS — Z9889 Other specified postprocedural states: Secondary | ICD-10-CM

## 2022-09-20 ENCOUNTER — Other Ambulatory Visit (HOSPITAL_BASED_OUTPATIENT_CLINIC_OR_DEPARTMENT_OTHER): Payer: Self-pay | Admitting: Physician Assistant

## 2022-09-20 DIAGNOSIS — Z9889 Other specified postprocedural states: Secondary | ICD-10-CM

## 2022-09-21 MED ORDER — OXYCODONE HCL 5 MG OR TABS
5.0000 mg | ORAL_TABLET | ORAL | 0 refills | Status: DC | PRN
Start: 2022-09-21 — End: 2022-09-29

## 2022-09-21 MED ORDER — METHOCARBAMOL 500 MG OR TABS
500.0000 mg | ORAL_TABLET | Freq: Four times a day (QID) | ORAL | 0 refills | Status: DC | PRN
Start: 2022-09-21 — End: 2022-11-06

## 2022-09-21 NOTE — Telephone Encounter (Signed)
Pt sent 2 separate encounters for refill requests over the weekend. Combining refill requests to one TE - please see 09/19/2022 TE for continuation.     Closing this encounter

## 2022-09-21 NOTE — Addendum Note (Signed)
Addended by: Bethann Humble on: 09/21/2022 01:09 PM     Modules accepted: Orders

## 2022-09-21 NOTE — Telephone Encounter (Signed)
Update per Ouida Sills, ARNP:      Provider has refilled medications to pt preferred pharmacy. RN called pt and provided update on medication refills. Informed pt that Ouida Sills, ARNP will be calling pt today/tomorrow to review specific weaning/taper plan for oxycodone. Pt was encouraged to notify RN by Wednesday this week if has not heard from team. RN encouraged pt to contact pharmacy to see when medications will be ready for pick up. Pt thankful for update.

## 2022-09-21 NOTE — Telephone Encounter (Signed)
Hx:  "56 year old female who is here for a follow up appointment after she presented to the ED and underwent C7-T1 ACDF 11/3 given concern for cauda equina. She was discharged after 2 nights in the hospital. Unfortunately she presented to the ED on 08/25/22 due to worsening BLE weakness/numbness/paraesthesias and gait difficulties and underwent C7 laminectomy with Dr. Reesa Chew on 08/27/22." (Per Paredes, ARNP 09/17/2022 OV note)    Last OV: 09/17/2022 w/Paredes, ARNP  Next OV: 11/18/2022 w/Dr. Reesa Chew    Situation: Pt sent separate refill requests for Oxycodone and Methocarbamol over the weekend. Adding to one TE for continuation. Oxycodone and methocarbamol last ordered 09/14/2022.    RN called and spoke w/pt who requests refill of Oxycodone and Methocarbamol to [facility]. Will be out of oxycodone tomorrow AM. Endorsing pain to neck around surgical area, describing as a "pull, kind of a popping, burning sensation" when reaching w/arms. Popping sensation and burning is intermittent and resolves quickly, pt notes this has been going on x2 wks. Endorsing intermittent muscle tightness to neck. Pain to neck overall improved. Reports ongoing lower back pain prior to original surgery - states lower back was being worked up prior to emergent symptoms and surgery. Pt is tearful on the phone noting concern of needing/taking oxycodone, wants to wean off of this medication. Pt is "nervous" that lower back pain will return once off steroid taper. First 3 days of taper helped w/pain w/slight return of pain last night (not as severe as prior). Would like plan for managing ongoing low back pain. Currently, pain to lower back is overall improved. RN encouraged setting up appt w/PCP/pain provider for resuming management of the ongoing back pain for when 6 wk post op timeframe approaches so that they can discuss pain plan options for ongoing low back pain. Educated on post-op restrictions, use of good body mechanics. Denies  new n/t, saddle anesthesia, new/sudden weakness or incontinence - RN educated pt to seek emergent care/911 if occur, pt verbalized understanding    current interventions:  - Oxycodone: '5mg'$  Q5hrs - is hoping to wean and would like specific weaning plan. Weaning to Q6hrs today.   - Methocarbamol: '500mg'$  Q6hrs  - methylprednisolone dose pak   - Tylenol: 1,'000mg'$  Q6    Pt confirms is not taking oxycodone and methocarbamol at the same time.     Plan: routing refill request and triage to APP pool for review and recommendations.           Plan:

## 2022-09-22 ENCOUNTER — Telehealth (HOSPITAL_BASED_OUTPATIENT_CLINIC_OR_DEPARTMENT_OTHER): Payer: Self-pay | Admitting: Registered Nurse

## 2022-09-22 ENCOUNTER — Telehealth (HOSPITAL_BASED_OUTPATIENT_CLINIC_OR_DEPARTMENT_OTHER): Payer: Self-pay | Admitting: Neurological Surgery

## 2022-09-22 NOTE — Telephone Encounter (Signed)
Hx: " 56 year old female who is here for a follow up appointment after she presented to the ED and underwent C7-T1 ACDF 11/3 given concern for cauda equina. She was discharged after 2 nights in the hospital. Unfortunately she presented to the ED on 08/25/22 due to worsening BLE weakness/numbness/paraesthesias and gait difficulties and underwent C7 laminectomy with Dr. Reesa Chew on 08/27/22." (Per 09/17/2022 note)    Last OV: 09/17/2022 w/Paredes, ARNP  Next OV: 11/18/2022 w/Dr. Reesa Chew    Situation: RN spoke w/pharmacy who notes oxycodone needs PA or pt can pay out of pocket $20.08. RN then called pt who noted they plan to pay out of pocket for this refill. Pt will notify RN at least 2 days prior to need of next refill so PA can be initiated right away if needed.     Plan: Pt states plan to pay out of pocket for oxycodone.

## 2022-09-22 NOTE — Telephone Encounter (Signed)
Patient called requesting Pre-authorization for prescription refill needed by insurance. Please call patient. Thanks.    Best,  Chenae Brager

## 2022-09-22 NOTE — Telephone Encounter (Signed)
I called Autumn Camacho to discuss her pain. She says that the majority of her immediate surgical pain has resolved, and that she is mostly experiencing muscle tightness and discomfort around her incision. I encouraged her to take her muscle relaxer, and we discussed that muscles take 6-8 weeks to heal. She declined trying an increased dose of the muscle relaxer.   We discussed a wean of her oxycodone. I encouraged her to try to go to 5 mg 8 hours in 2 days, then next week go down to 5 mg q12 next week. I discussed with her we are not able to provide refills past the 6 week mark. She has a PCP and pain doctor that she can work with if needed, but she is optimistic that we can wean off opioids in the meantime.

## 2022-09-25 ENCOUNTER — Encounter (HOSPITAL_BASED_OUTPATIENT_CLINIC_OR_DEPARTMENT_OTHER): Payer: Self-pay

## 2022-09-28 ENCOUNTER — Other Ambulatory Visit (HOSPITAL_BASED_OUTPATIENT_CLINIC_OR_DEPARTMENT_OTHER): Payer: Self-pay | Admitting: Registered Nurse

## 2022-09-28 DIAGNOSIS — Z9889 Other specified postprocedural states: Secondary | ICD-10-CM

## 2022-09-28 NOTE — Telephone Encounter (Signed)
Hx: " underwent C7-T1 ACDF 11/3 given concern for cauda equina. She was discharged after 2 nights in the hospital. Unfortunately she presented to the ED on 08/25/22 due to worsening BLE weakness/numbness/paraesthesias and gait difficulties and underwent C7 laminectomy with Dr. Reesa Chew on 08/27/22." (Per last OV note).     Last OV: 09/17/2022 w/Paredes, ARNP  Next OV: 11/18/2022 w/Dr. Reesa Chew    Situation: Pt requesting refill oxycodone, will be out Thursday. Notes sending request early as insurance may need PA. RN spoke w/pt. Pt requesting refill oxycodone to [facility] in [place] on [address] endorsing ongoing "muscle tightness, pulling" pain to back of neck around surgical ara. Reports intermittent, sharp "sting and burn" to back of neck. Pt has stopped Methocarbamol, noting it caused nausea and headache, symptoms resolved after stopping methocarbamol. Pt states they tried to wean oxycodone to Q8hrs, but notes pain increased to lower back and unable to tolerate weaning to Q8. Will see pain provider next week, 10/05/2022, plans to discuss having them take over pain management and plan for pain management since post op 6 week timeframe is approaching. Denies new n/t, saddle anesthesia, new/sudden weakness or incontinence - RN educated pt to seek emergent care/911 if occur, pt verbalized understanding.    Pt reporting "white" spot at top of incision and wondered if there was a left over stitch. Reports ongoing redness and swelling to area. Reports area around drainage tube also "red and puffy" noting increased redness since 09/09/2022 OV. Denies drainage to either incision/wound. Denies fever/chills, drainage. RN educated pt on SS of infection and wound concerns to seek emergent care. RN has asked for photos.      Current Interventions:  - oxycodone '5mg'$  Q6hr  - tylenol: 1,'000mg'$  Q6hrs  - Meloxicam: Pt states restarted last week. Pt states team had advised waiting 6 weeks, but if unable to tolerate pain pt should  wait at least wait 3 weeks (this is per pt report).     Photos:          Top of incision:      Drainage site:      Plan: Routing triage and refill request to APP for review and recommendations. Will need to contact pharmacy and/or insurance if refill is approved as pt may require PA (per previous refill update from pharmacy)

## 2022-09-30 MED ORDER — OXYCODONE HCL 5 MG OR TABS
5.0000 mg | ORAL_TABLET | Freq: Four times a day (QID) | ORAL | 0 refills | Status: DC | PRN
Start: 2022-09-30 — End: 2022-12-08

## 2022-09-30 NOTE — Telephone Encounter (Signed)
RN was updated by Ouida Sills, ARNP that ARNP called and spoke w/pt about concerns and provided refill. RN called pharmacy to inquire if PA needed on oxycodone. They note options - insurance PA or out of pocket cost would be $26.75. RN called pt and proposed options, pt prefers to pay out of pocket. RN informed pt that RN will update pharmacy w/this update and recommended they f/up w/pharmacy to see when med will be ready for pick up. Pt verbalized understanding.     Plan: RN called pharmacy and provided update that pt plans to pay out of pocket for medication. Pharmacy will process medication refill for pick up today.     Pt is aware that this is the last refill. Has appt w/pain provider 10/05/2022 to take over management of pain management.

## 2022-10-01 ENCOUNTER — Other Ambulatory Visit (HOSPITAL_BASED_OUTPATIENT_CLINIC_OR_DEPARTMENT_OTHER): Payer: Self-pay | Admitting: Rheumatology

## 2022-10-01 DIAGNOSIS — M0579 Rheumatoid arthritis with rheumatoid factor of multiple sites without organ or systems involvement: Secondary | ICD-10-CM

## 2022-10-02 ENCOUNTER — Other Ambulatory Visit (HOSPITAL_BASED_OUTPATIENT_CLINIC_OR_DEPARTMENT_OTHER): Payer: Self-pay

## 2022-10-02 MED ORDER — CIMZIA (2 SYRINGE) 200 MG/ML SC PSKT
200.0000 mg | PREFILLED_SYRINGE | SUBCUTANEOUS | 1 refills | Status: DC
Start: 2022-10-02 — End: 2022-11-23

## 2022-10-05 ENCOUNTER — Ambulatory Visit: Payer: No Typology Code available for payment source | Attending: Pain Management | Admitting: Pain Management

## 2022-10-05 ENCOUNTER — Other Ambulatory Visit (HOSPITAL_BASED_OUTPATIENT_CLINIC_OR_DEPARTMENT_OTHER): Payer: Self-pay

## 2022-10-05 VITALS — BP 113/79 | HR 89 | Temp 98.4°F

## 2022-10-05 DIAGNOSIS — M792 Neuralgia and neuritis, unspecified: Secondary | ICD-10-CM | POA: Insufficient documentation

## 2022-10-05 DIAGNOSIS — R29898 Other symptoms and signs involving the musculoskeletal system: Secondary | ICD-10-CM | POA: Insufficient documentation

## 2022-10-05 DIAGNOSIS — M4802 Spinal stenosis, cervical region: Secondary | ICD-10-CM | POA: Insufficient documentation

## 2022-10-05 DIAGNOSIS — R4589 Other symptoms and signs involving emotional state: Secondary | ICD-10-CM | POA: Insufficient documentation

## 2022-10-05 MED ORDER — DULOXETINE HCL 30 MG OR CPEP
DELAYED_RELEASE_CAPSULE | ORAL | 2 refills | Status: DC
Start: 2022-10-05 — End: 2023-03-03

## 2022-10-05 NOTE — Progress Notes (Signed)
Review of Systems   Constitutional:  Positive for activity change and appetite change.   HENT: Negative.     Eyes: Negative.    Respiratory: Negative.     Cardiovascular: Negative.    Gastrointestinal:  Positive for nausea.   Endocrine: Negative.    Genitourinary:  Positive for flank pain.   Musculoskeletal:  Positive for arthralgias, back pain, gait problem, joint swelling, neck pain and neck stiffness.   Skin: Negative.    Allergic/Immunologic: Positive for environmental allergies.   Neurological:  Positive for weakness and numbness.   Hematological: Negative.    Psychiatric/Behavioral: Negative.

## 2022-10-05 NOTE — Patient Instructions (Addendum)
Duloxetine prescribed    2. TENS unit     3. Oxycodone probably can start be tapered down to 5 mg three times a day     4. Clinical pharmacist follow up in 2 weeks

## 2022-10-06 NOTE — Progress Notes (Signed)
Progress Note     Patient Name: Autumn Camacho, Autumn Camacho.  Date of Service: October 05, 2022    Patient ID: [id number] Date of Birth: [birth year]    Clinician: Ardith Dark, MD Facility: [id number]   Location: [facility]         The patient is a pleasant 56 year old female well-known to [place] for her chronic low back pain and sciatic pain.  We saw Autumn Camacho most recently in 05/2022.  She was status post lumbar epidural injection for her low back and sciatic pain.  She did very well with this procedure, and we have been also discussing with Autumn Camacho when possibly to repeat this injection.     However, it happened so that since her visit with [place] for her low back and left sciatic pain she started to develop a different set of symptoms.  She developed lower extremity pain and numbness, also some numbness in the upper extremities, and then eventually it turned out that she was diagnosed with cervical stenosis and she had ACDF from C7 to T1 on 08/21/2022.  However, then on 11/09, for a diagnosis of cervical myelopathy, cervical stenosis, she had a C7 laminectomy and inferior C6 laminotomy.  The patient apparently has been recovering from a surgical standpoint reasonably well, however, she continues to require opioid medications.  Currently she is on 5 mg of oxycodone 4 times a day.  She tells me that last week she tried to lower the dose to 3 times a day of 5 mg of oxycodone, but this was challenging.     Other treatments in the past, gabapentin gave her side effects, nortriptyline she did not remember very well what the effect was, but it looks like she had a heart palpitations since it is listed under her allergy section now, so that gabapentin and nortriptyline apparently are not an option at this time.     She has never been on a duloxetine type medication before.     Autumn Camacho tells me that most pain at this time is not in her neck area where her surgery was, but mostly in her low back and not as much in a sciatic distribution, but  in her low back, and she for example today was in a wheelchair, although she tells me that at home she does not use a wheelchair.     In in terms of past medical history, it is noted for rheumatoid arthritis.  The patient is on disease modifying treatment.  A history of osteoarthritis, and also degenerative disk disease in lumbar and cervical spine.  A history of low back and sciatic pain, and then as mentioned more recently a history of cervicalgia and then two cervical spine surgeries in 08/2022.     She has not filled out a pain tracker, and pain questionnaire is not fully filled out, but there were 2 answers in the depression field of PHQ-4 and score was 6/6.     In terms of treatments, once again, gabapentin and nortriptyline gave her side effects, so that is not an option now.     She typically takes meloxicam, which was withheld for surgery, but this coming Thursday, which is in 3 days, she will be restarting her meloxicam, and she is looking forward to this.     She takes Tylenol 1000 mg 3 or 4 times a day.     At this point, although she is not chronically on opioid medications, but she takes 5 mg of oxycodone 4  times a day.     Methocarbamol she is no longer taking.     PHYSICAL EXAMINATION:    GENERAL:  She is alert and oriented, in no distress, but tearful on a few occasions.  She is here with her husband.  RESPIRATIONS:  Unlabored.  MUSCULOSKELETAL:  She is in wheelchair, although she can ambulate at home and she does do so.   SKIN:  Her post-surgical incision posteriorly in her neck area seems to be healing well.  She has questions about 2 spots which she also sent a photo to her surgical team, but it is my impression apparently it was their impression as well that this is just a couple areas of scab developing.  However, I asked her to have very low threshold should appearance of her postsurgical scar worsen in any locations then to contact her surgical team immediately, but at this point I does not  feel very alarming to me.  There is no discharge.  There is no obvious inflammation.     ASSESSMENT AND PLAN:    The patient was seen today.  This is mostly again protracted post-surgical pain management.  She has been very stressed out.  She had one surgery then in a few days required a second surgery, as mentioned anterior fusion initially and then a laminotomy next as described in the note.  Has not come off opioid medications yet.  She has never been on opioids long-term.  She will be seeing the surgical team in a few weeks, but tomorrow she has a meeting with her primary care provider, Dr. Virl Axe.     RECOMMENDATIONS:  1.  Certainly the first goal would be to help the patient with whatever modalities it may take to gradually taper down and off her current opioid regimen.  2.  I expect that from tomorrow she should be able to be on 5 mg of oxycodone 3 times a day and then gradually reducing this dose by half a tablet every week, so that it may take 6 weeks for her to come off her oxycodone.  3.  In the meantime, I have prescribed to her duloxetine with intention to support her somewhat distressed mood after unexpected surgical interventions in the light of ongoing pain, but also to address her musculoskeletal and neuropathic pain too.  Starting from 30 mg and then increasing the dose in ten days to 60 mg a day if she does well.  4.  Her meloxicam will be restarting on Thursday.  5.  In terms of opioid prescribing, I asked Autumn Camacho to discuss with her primary care team hopefully they can take over this temporary prescribing regimen.  Again, I expect that latest in 6 weeks the patient will be off her opioid medications.  6.  Our clinical pharmacy team will follow up with the patient within 2 weeks as to how her duloxetine regimen is progressing.       I asked the patient to communicate with me via MyChart messaging as well later this week how things will be progressing.  Ardith Dark, MD      Date Dictated: 10/05/2022 1:17:51 PM    Date Transcribed: 10/06/2022    IS/GJ   Job #: [id number]

## 2022-10-08 DIAGNOSIS — R4589 Other symptoms and signs involving emotional state: Secondary | ICD-10-CM | POA: Insufficient documentation

## 2022-10-09 ENCOUNTER — Telehealth (HOSPITAL_BASED_OUTPATIENT_CLINIC_OR_DEPARTMENT_OTHER): Payer: Self-pay

## 2022-10-09 ENCOUNTER — Encounter (HOSPITAL_BASED_OUTPATIENT_CLINIC_OR_DEPARTMENT_OTHER): Payer: Self-pay | Admitting: Pain Management

## 2022-10-09 ENCOUNTER — Other Ambulatory Visit (HOSPITAL_BASED_OUTPATIENT_CLINIC_OR_DEPARTMENT_OTHER): Payer: Self-pay

## 2022-10-09 NOTE — Telephone Encounter (Signed)
Scheduled patient for pharmacy call on 10/30/22 per Dr. Otilio Jefferson + waitlist.    Pt also wanted to check in with the clinic on how she's doing with medication (I believe she started duloxetine after 12/18 visit). Took first pill Monday night, woke up @ 4am with diaherra + nausea which persisted throughout Tuesday. Pt unsure if this is a result of weaning off oxycodone or how much is from duloxetine.    Pt sent home from hospital w/ medication for nausea and has 4 pills left (zofran). Asks if it can be refilled at diff pharmacy closer to home. Advised pt likely will need to call hospital pharmacy. Pt agrees and will send MyChart message for update.

## 2022-10-20 ENCOUNTER — Other Ambulatory Visit (INDEPENDENT_AMBULATORY_CARE_PROVIDER_SITE_OTHER): Payer: Self-pay | Admitting: Orthopaedic Surgery

## 2022-10-22 ENCOUNTER — Other Ambulatory Visit (HOSPITAL_BASED_OUTPATIENT_CLINIC_OR_DEPARTMENT_OTHER): Payer: Self-pay

## 2022-10-23 ENCOUNTER — Other Ambulatory Visit (HOSPITAL_BASED_OUTPATIENT_CLINIC_OR_DEPARTMENT_OTHER): Payer: Self-pay

## 2022-10-29 ENCOUNTER — Other Ambulatory Visit (INDEPENDENT_AMBULATORY_CARE_PROVIDER_SITE_OTHER): Payer: Self-pay | Admitting: Orthopaedic Surgery

## 2022-10-29 DIAGNOSIS — M25562 Pain in left knee: Secondary | ICD-10-CM

## 2022-10-29 DIAGNOSIS — M25552 Pain in left hip: Secondary | ICD-10-CM

## 2022-10-30 ENCOUNTER — Ambulatory Visit: Payer: Medicare HMO | Admitting: Unknown Physician Specialty

## 2022-10-30 ENCOUNTER — Other Ambulatory Visit (HOSPITAL_BASED_OUTPATIENT_CLINIC_OR_DEPARTMENT_OTHER): Payer: Self-pay

## 2022-10-30 DIAGNOSIS — Z79899 Other long term (current) drug therapy: Secondary | ICD-10-CM

## 2022-10-30 NOTE — Progress Notes (Signed)
CLINICAL PAIN MANAGEMENT PHARMACIST TELEPHONE VISIT    IDENTIFICATION/CHIEF COMPLAINT:   57 year old female with history of chronic low back pain and sciatic pain. Patient is s/p mutlipe neurosurgical interventions in November 2023; ACDF from C7 to T1 on 08/21/2022 and C7 laminectomy and inferior C6 laminotomy on 08/28/22    REFERRAL/CONSULT:  Dr. Doran Clay consulted PharmD on 10/05/22 to provide clinical assessment and facilitate Duloxetine titration.     Previous Evaluation by Dr. Doran Clay on 10/05/22  1.  Certainly the first goal would be to help the patient with whatever modalities it may take to gradually taper down and off her current opioid regimen.  2.  I expect that from tomorrow she should be able to be on 5 mg of oxycodone 3 times a day and then gradually reducing this dose by half a tablet every week, so that it may take 6 weeks for her to come off her oxycodone.  3.  In the meantime, I have prescribed to her duloxetine with intention to support her somewhat distressed mood after unexpected surgical interventions in the light of ongoing pain, but also to address her musculoskeletal and neuropathic pain too.  Starting from 30 mg and then increasing the dose in ten days to 60 mg a day if she does well.  4.  Her meloxicam will be restarting on Thursday.  5.  In terms of opioid prescribing, I asked Mazie to discuss with her primary care team hopefully they can take over this temporary prescribing regimen.  Again, I expect that latest in 6 weeks the patient will be off her opioid medications.  6.  Our clinical pharmacy team will follow up with the patient within 2 weeks as to how her duloxetine regimen is progressing.    Previous MyChart Communication 10/09/22  Scheduled patient for pharmacy call on 10/30/22 per Dr. Otilio Jefferson + waitlist.  Pt also wanted to check in with the clinic on how she's doing with medication (I believe she started duloxetine after 12/18 visit). Took first pill Monday night, woke  up @ 4am with diaherra + nausea which persisted throughout Tuesday. Pt unsure if this is a result of weaning off oxycodone or how much is from duloxetine.     Current Analgesic Regimen:   Duloxetine 60 mg daily- started 10/05/22, last increased on 10/24/22    Oxycodone 5 mg- tapered off 10/24/22       Prior Medication Trial(s)  Max Dose (Duration) Timeline Pain Efficacy (Y/N/Unknown) Side Effects Reason for discontinuation   gabapentin    Y- unknown    nortriptyline    Y- heart palpitations              [place] PMP Medication Dispense History (from 11/04/2021 to 10/29/2022)     Dispensed Days Supply Quantity   OXYCODONE HCL (IR) 5 MG TABLET 10/10/2022 15 30 unspecified   OXYCODONE HCL (IR) 5 MG TABLET 09/30/2022 11 42 unspecified   OXYCODONE HCL (IR) 5 MG TABLET 09/21/2022 4 30 unspecified   OXYCODONE HCL (IR) 5 MG TABLET 09/14/2022 4 30 unspecified   OXYCODONE HCL (IR) 5 MG TABLET 09/09/2022 4 30 unspecified   OXYCODONE HCL (IR) 5 MG TABLET 08/31/2022 4 30 unspecified   OXYCODONE HCL (IR) 5 MG TABLET 08/23/2022 5 42 unspecified   DIAZEPAM 5 MG TABLET 07/01/2022 4 8 unspecified     I reviewed the [place] state prescription drug monitoring program and it is consistent with [facility] prescriptions and known controlled substance prescriptions.     SUBJECTIVE:  Patient reports that overall she has noticed improvement in numbness and pain sensation in her lower extremities. She states that this has improved with increased dose of duloxetine. Initially she experienced increased diarrhea and nausea when starting duloxetine however this coincided with decreasing oxycodone and she is unsure which caused the bulk of this side effect. She has tapered off of oxycodone as of last week without issue. She reports insomnia with increased duloxetine dose and took the medication last night at 7 pm and did not fall asleep until 3 am, which she reports is unusual for her.     OBJECTIVE:    Creatinine (mg/dL)   Date Value   08/31/2022 0.49        Albumin   Date Value Ref Range Status   08/26/2022 4.0 3.5 - 4.9 g/dL Final     Bilirubin (Total)   Date Value Ref Range Status   08/25/2022 0.4 0.2 - 1.3 mg/dL Final     No results found for: "BILIRUBNDIR"  AST (GOT)   Date Value Ref Range Status   08/25/2022 22 9 - 38 U/L Final     Alkaline Phosphatase (Total)   Date Value Ref Range Status   08/25/2022 59 34 - 121 U/L Final     No results found for: "ALA"    ASSESSMENT/PLAN:  Given insomnia, recommend changing to morning administration. Follow up in 2-3 weeks.     Pharmacist Follow-up: 1/31 '@220pm'$   Next Provider Follow-up: To be scheduled    Prescriptions Written during Encounter: none needed  No orders of the defined types were placed in this encounter.       I called and spoke to the patient over the telephone for this encounter. I spent a total time of 12 minutes on the phone with the patient, of which more than 50% was spent counseling as outlined in this note.     Lattie Haw, PharmD, BCPS  Clinical Pain Pharmacist   [facility]

## 2022-11-06 ENCOUNTER — Ambulatory Visit (INDEPENDENT_AMBULATORY_CARE_PROVIDER_SITE_OTHER): Payer: No Typology Code available for payment source | Admitting: Orthopaedic Surgery

## 2022-11-06 ENCOUNTER — Inpatient Hospital Stay (INDEPENDENT_AMBULATORY_CARE_PROVIDER_SITE_OTHER)
Admit: 2022-11-06 | Discharge: 2022-11-06 | Disposition: A | Discharge status: Home / Self Care | Payer: No Typology Code available for payment source | Source: Home / Self Care

## 2022-11-06 ENCOUNTER — Encounter (INDEPENDENT_AMBULATORY_CARE_PROVIDER_SITE_OTHER): Payer: Self-pay | Admitting: Physician Assistant

## 2022-11-06 VITALS — BP 127/85 | HR 83 | Temp 98.0°F | Resp 18

## 2022-11-06 DIAGNOSIS — M25562 Pain in left knee: Secondary | ICD-10-CM

## 2022-11-06 DIAGNOSIS — M05752 Rheumatoid arthritis with rheumatoid factor of left hip without organ or systems involvement: Secondary | ICD-10-CM

## 2022-11-06 DIAGNOSIS — M25552 Pain in left hip: Secondary | ICD-10-CM

## 2022-11-06 NOTE — Progress Notes (Signed)
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    HPI:  Autumn Camacho has rheumatoid arthritis and she is suffering from low back pain, hip pain and knee instability. She is ambulating with a walker.     Low back: Her low back pain is located on the right side. Her back issues have not been resolved yet. She also reports sacrum pain.    Neck: Autumn Camacho states she had numbness from the waist down. She had multiple tests done to determine the cause. She ended up at the hospital because she fell. Eventually they found a disc in the neck that was bruising her spinal column. She had 2 neck surgeries which fixed the problem.     Left knee: Her knee has been grinding and feels unstable and wiggles. She states it pulls in while walking.     Of note, she mentioned there is still numbness if her right leg.    ROS:  Autumn Camacho reports no tingling, numbness, or weakness in the affected extremity.    EXAM:  On physical exam, looks her stated age.  Autumn Camacho is oriented to time, place, and person.  Her gait is severely antalgic.     Exam of left hip:  No visible atrophy. Greater trochanter is tender to palpation. Evidence of crepitus with passive rotation. Passive painful ROM is measured at 10-90 degrees extension to flexion. Evidence of guarding. Stinchfield test is positive. Unable to do SLR.    Exam of right hip: negative log roll. Good strength.     Left knee: Incision is well healed.  No effusion.  ROM good.  Stable in valgus/varus.  Patella tracks centrally.     Distal neurocirculatory exam: Pulses intact (2+ PT and DP) bilaterally, sensation intact to light touch bilaterally, EHL motor strength 5/5 bilaterally.    Skin temperature and turgor are normal throughout the lower extremities, without swelling, varicosities, or  lymphadenopathy.    IMAGING:    IMPRESSION  Radiographic imaging of the Left hip were obtained today and noted for the following observations:  Significant joint space narrowing  Evidence of large periarticular osteophytes  Presence of subchondral cysts  Evidence of joint subluxation  Overall, this is consistent with severe osteoarthritis of the Left hip, Kellgren-Lawrence Stage IV.       Well aligned, well fixed B TKA.      IMPRESSION:  Autumn Camacho is a 57 year old female with severe degenerative joint disease of the left hip.    PLAN:  At this time, the patient has persistent left hip pain which has failed six months of non-surgical management, including:  NSAIDS, analgesics, light exercise, assistive devices, and complementary medicines.  The patient has severe hip joint disease causing functional limitations impairing age-appropriate activities of daily living.  These activities include, but are not limited to, walking, bathing, dressing, personal hygiene, and normal household chores, as well as fitness and sporting activities.       Following our discussion of options, Ms. [name] decided that total  hip arthroplasty seems a reasonable option. We discussed that THA is usually a reliable procedure, with published success rates in excess of 90% at 10 years, but that there are a number of risks associated with the procedure. We reviewed the risks of THA today. I explained that these risks include (but are not  limited to): infection, bleeding, nerve injury, thromboembolic disease, medical/anesthetic risks including death, severe cardiac events, and stroke/cerebral impairment, as well as hip-specific risks such as leg-length inequality, dislocation,instability, stiffness, limp or abnormal gait, persistent pain, and possible need for re-operation or revision surgery, either early or later on in life.  I spent some time answering Ms. [name]'s questions, and made no guarantees as to outcome. she desires to proceed.  We will begin the pre-operative process per our usual protocol.      Follow-up will be for the pre-operative appointment or as needed for pain.       I,Paul Leane Para, MD, interviewed and examined the patient while overseeing the documentation performed by my Medical Scribe, Handy Mcloud. I have reviewed and revised as necessary the scribe's note and agree with the documented findings and plan of care. Please refer to the scribe's note below for further detailed information regarding the patient encounter and exam.  11/06/2022. 11:20 AM

## 2022-11-09 ENCOUNTER — Telehealth (HOSPITAL_BASED_OUTPATIENT_CLINIC_OR_DEPARTMENT_OTHER): Payer: Self-pay

## 2022-11-09 NOTE — Telephone Encounter (Signed)
Patient is scheduled for surgery in early March and asks if she needs to wean of duloxetine before surgery. Routing to [employer], pt states ok to f/u via MyChart

## 2022-11-13 ENCOUNTER — Telehealth (HOSPITAL_BASED_OUTPATIENT_CLINIC_OR_DEPARTMENT_OTHER): Payer: Self-pay

## 2022-11-13 NOTE — Telephone Encounter (Signed)
S/B Autumn Camacho calls today in tears because the Duloxetine is preventing her from sleeping and it is not helping her pain. She said that she transitioned from 30 mg to 60 mg and also had weaned off of her oxycodone at the same time, so she is not sure if 30 mg was an effective dose for her. She has an appointment with the clinical pharmacist to discuss the duloxetine on 11/18/22. She agreed to try the 30 mg until her appointment to see if that helps with her side effects. She will drop to 30 mg starting now and then will be evaluated by the clinical pain pharmacist.     A: Patient having difficulty on Duloxetine    R: Recommended that the patient drop back to Duloxetine 30 mg and keep appointment with clinical pharmacy team on 11/18/2022

## 2022-11-18 ENCOUNTER — Ambulatory Visit
Admission: RE | Admit: 2022-11-18 | Discharge: 2022-11-18 | Disposition: A | Discharge status: Home / Self Care | Payer: No Typology Code available for payment source | Attending: Diagnostic Radiology | Admitting: Diagnostic Radiology

## 2022-11-18 ENCOUNTER — Other Ambulatory Visit (HOSPITAL_BASED_OUTPATIENT_CLINIC_OR_DEPARTMENT_OTHER): Payer: Self-pay | Admitting: Physician Assistant

## 2022-11-18 ENCOUNTER — Ambulatory Visit (HOSPITAL_BASED_OUTPATIENT_CLINIC_OR_DEPARTMENT_OTHER): Payer: No Typology Code available for payment source | Admitting: Neurological Surgery

## 2022-11-18 ENCOUNTER — Ambulatory Visit (HOSPITAL_BASED_OUTPATIENT_CLINIC_OR_DEPARTMENT_OTHER): Payer: Medicare Other | Admitting: Pharmacist

## 2022-11-18 DIAGNOSIS — Z79899 Other long term (current) drug therapy: Secondary | ICD-10-CM

## 2022-11-18 DIAGNOSIS — M4150 Other secondary scoliosis, site unspecified: Secondary | ICD-10-CM

## 2022-11-18 DIAGNOSIS — Z9889 Other specified postprocedural states: Secondary | ICD-10-CM

## 2022-11-18 DIAGNOSIS — M48062 Spinal stenosis, lumbar region with neurogenic claudication: Secondary | ICD-10-CM

## 2022-11-18 DIAGNOSIS — M5416 Radiculopathy, lumbar region: Secondary | ICD-10-CM

## 2022-11-18 DIAGNOSIS — M5136 Other intervertebral disc degeneration, lumbar region: Secondary | ICD-10-CM | POA: Insufficient documentation

## 2022-11-18 NOTE — Progress Notes (Unsigned)
[facility]  Provider: Rolene Course, MD  Visit Date: 11/18/2022      HPI:  Autumn Camacho is a 57 year old female who presents s/p C7-T1 ACDF given concern for cauda equina. On 11/3, she presented to the ED 11/7/232 with worsened BLE weakness, numbness, paresthesias, gait difficulties and underwent C7 laminectomy 08/27/2022 Dr. Althia Forts.     Symptoms: Numbness/burning pain down R leg from hip down to knee, shin, top and bottom of foot. "Guitar string" feeling down R leg, from hip down to foot. L leg is doing better. Had shooting pains down right leg x 1 week a month ago, now resolved. Bowel bladder control intact, having sensation that she needs to go. Walking with walker.     Doing PT- was going to start working on using a cane but having trouble with L hip, she is scheduled for hip arthroplasty in 12/2021    The patient was referred by: Dr. No ref. provider found.      REVIEW OF SYSTEMS:  ROS      CURRENT MEDICATIONS:  Current Outpatient Medications   Medication Sig Dispense Refill    acetaminophen 500 MG tablet Take 2 tablets (1,000 mg) by mouth every 6 hours as needed.      certolizumab (Cimzia Starter Kit) 6 X 200 MG/ML prefilled syringe kit Inject 2 syringes (400 mg) under the skin initially, and at weeks 2 and 4 followed by 200 mg every other week 3 each 0    certolizumab (Cimzia) 2 X 200 MG/ML prefilled syringe kit Inject 1 mL (200 mg) under the skin every 2 weeks. 2 each 1    DULoxetine 30 MG DR capsule Start from 30 mg once a day. In 10 days increase up to 60 mg a day (Patient taking differently: No sig reported) 60 capsule 2    Fexofenadine HCl (ALLEGRA OR) Take 1 tablet by mouth daily. (Patient not taking: Reported on 11/18/2022)      leflunomide 20 MG tablet TAKE 1 TABLET DAILY 90 tablet 1    mupirocin 2 % ointment Apply 1 Application  (1 g) topically daily. Apply to nose.      oxyCODONE 5 MG tablet Take 1 tablet (5 mg) by mouth every 6 hours as needed for  moderate pain or severe pain. 42 tablet 0    polyethylene glycol 3350 17 GM/SCOOP oral powder Fill cap with powder to the 17 gram mark and dissolve in 4 to 8 ounces of water. Take 17 g by mouth daily. 476 g 0    psyllium 58.12 % packet Take 1 packet by mouth daily as needed for constipation. Mix in 8 oz of juice or water and drink. (Patient not taking: Reported on 11/18/2022) 30 packet 0    valACYclovir 1 g tablet Take 2 tablets (2,000 mg) by mouth every morning.       No current facility-administered medications for this visit.       ALLERGIES:  Review of patient's allergies indicates:  Allergies   Allergen Reactions    Fentanyl Unknown and Other     Per patient, she has "forgotten to breathe"    Hydromorphone NU:[id number]    Remicade [Infliximab] Anaphylaxis    Adhesives Skin: Rash     Dermabond causes skin blistering    Codeine Unknown and Other     "Feels like her skin is crawling"  Extreme feeling of something crawling    Nortriptyline Unknown and Other  Heart palpitations         PAST MEDICAL HISTORY:  Past Medical History:   Diagnosis Date    Chronic right hip pain 02/26/2019    Allergic asthma     Fracture     Bilateral elbow fx    Genital herpes     Rheumatoid arthritis (HCC)     Rheumatoid arthritis ([place])     Spinal stenosis     Tooth disorder     MISSING TOOTH UPPER LEFT BACK-MOLAR       PAST SURGICAL HISTORY:  Past Surgical History:   Procedure Laterality Date    foot Bilateral     Arch surgery    KNEE ARTHROPLASTY      LEFT 2018 REPAIR 2019    KNEE ARTHROPLASTY Left 05/01/2020    PR ADENOIDECTOMY PRIMARY <AGE 64      AGE 89  T/A     PR ANES; TOTAL KNEE REPLACEMENT Left 04/01/2020    PR TONSILLECTOMY ONE-HALF <AGE 64      PR UNLISTED PROCEDURE FEMUR/KNEE Bilateral     TKA    PR UNLISTED PROCEDURE FEMUR/KNEE      Left revision TKA     PR UNLISTED PROCEDURE FOOT/TOES Bilateral 2009 R,  2010 L    Reconstructive surgery    PR UNLISTED PROCEDURE PELVIS/HIP JOINT      DOS 07/30/20 R THA    TKA Left  2019    revision from prior TKA       FAMILY HISTORY:  Family History       Problem (# of Occurrences) Relation (Name,Age of Onset)    Cancer (1) Father    Heart Attack (1) Father    Rheumatoid Arthritis (1) Other            SOCIAL HISTORY:  Social History     Tobacco Use    Smoking status: Former     Packs/day: 1.00     Years: 20.00     Additional pack years: 0.00     Total pack years: 20.00     Types: Cigarettes     Quit date: 2015     Years since quitting: 9.0    Smokeless tobacco: Never    Tobacco comments:     hx : Off and on smoker but had vape , quit 2018.   Substance Use Topics    Alcohol use: Not Currently     Comment: remote history of alcohol dependence    Drug use: Never       PHYSICAL EXAM:  BP (P) 116/81   Pulse (!) (P) 102   Temp (P) 37.1 C (Temporal)   Ht (P) 5' 0.98" (1.549 m)   BMI (P) 34.05 kg/m   Neurological Exam  EXAM      UPPER          R           L  D                     4          4 pain lim  B (C5-6)          4          4 pain lim  T (C7-8)          4          4 pain lim  WE (C6)          4  4 pain lim  FDP (C8)         4          4 pain lim  ADM (T1)         4          4 pain lim     LOWER          R           L  IP (L2-3)          5          3- pain lim   Q (L4)              5          4- pain lim  TA (L4-5)         5         4- pain lim  G (S1)              5         5  EHL (L5)          5          5       IMAGING:  CT c spine wo 11/18/2022:  IMPRESSION  Partial fusion of the C7-T1 ACDF with hardware appearing intact and in adequate positions.  Other chronic findings as above.  This result has not been signed. Information might be incomplete.    IMPRESSION / ASSESSMENT:  No diagnosis found.    We recommend she moves forward with her hip arthroplasty before we pursue further spine surgeries.   It appears she has hardware losing at her ACDF site. We will present her at spine conference for recommendations for the hardware complication.   She will need a L2 laminectomy, L4-5  fusion in the future, after her hip and ACDF hardware are dealt with. FU with [place] a couple weeks after her hip surgery to see how she is doing for further discussion.     Prior EMR records reviewed as available and clinically relevant. All questions answered. Discharge and follow up instructions were discussed with the patient. The patient fully understands and is in agreement with the plan.    I spent a total of *** minutes for the patient's care on the date of the service.  {Vanishing Tip  When performed by provider on date of visit, these count toward total time  Chart review   History taking  Physical exam  Counseling, educating patient/family/caregiver  Orders   Referrals and communication with other providers (not separately reported)  Documentation  Care coordination (not separately reported)  Independent interpretation of results (not separately reported) and communicating results to patient/family/caregiver  :999}        Portions of this chart were made using voice recognition software.

## 2022-11-18 NOTE — Progress Notes (Signed)
CLINICAL PAIN MANAGEMENT PHARMACIST TELEPHONE VISIT    IDENTIFICATION/CHIEF COMPLAINT:   57 year old female with chronic low back pain and sciatic pain. Patient is s/p mutlipe neurosurgical interventions in November 2023; ACDF from C7 to T1 on 08/21/2022 and C7 laminectomy and inferior C6 laminotomy on 08/28/22    REFERRAL/CONSULT:  Dr. Doran Clay consulted PharmD on 10/05/22 to provide clinical assessment and facilitate Duloxetine titration.     Previous Evaluation by Dr. Doran Clay on 10/05/22  1.  Certainly the first goal would be to help the patient with whatever modalities it may take to gradually taper down and off her current opioid regimen.  2.  I expect that from tomorrow she should be able to be on 5 mg of oxycodone 3 times a day and then gradually reducing this dose by half a tablet every week, so that it may take 6 weeks for her to come off her oxycodone.  3.  In the meantime, I have prescribed to her duloxetine with intention to support her somewhat distressed mood after unexpected surgical interventions in the light of ongoing pain, but also to address her musculoskeletal and neuropathic pain too.  Starting from 30 mg and then increasing the dose in ten days to 60 mg a day if she does well.  4.  Her meloxicam will be restarting on Thursday.  5.  In terms of opioid prescribing, I asked Leonor to discuss with her primary care team hopefully they can take over this temporary prescribing regimen.  Again, I expect that latest in 6 weeks the patient will be off her opioid medications.  6.  Our clinical pharmacy team will follow up with the patient within 2 weeks as to how her duloxetine regimen is progressing.    Previous MyChart Communication 10/09/22  Scheduled patient for pharmacy call on 10/30/22 per Dr. Otilio Jefferson + waitlist.  Pt also wanted to check in with the clinic on how she's doing with medication (I believe she started duloxetine after 12/18 visit). Took first pill Monday night, woke up @ 4am  with diaherra + nausea which persisted throughout Tuesday. Pt unsure if this is a result of weaning off oxycodone or how much is from duloxetine.    Previous Evaluation by PharmD on 10/30/22:  Given insomnia, recommend changing to morning administration. Follow up in 2-3 weeks.    Previous Phone Call from Patient to RN 11/13/22:   S/B Shreeya calls today in tears because the Duloxetine is preventing her from sleeping and it is not helping her pain. She said that she transitioned from 30 mg to 60 mg and also had weaned off of her oxycodone at the same time, so she is not sure if 30 mg was an effective dose for her. She has an appointment with the clinical pharmacist to discuss the duloxetine on 11/18/22. She agreed to try the 30 mg until her appointment to see if that helps with her side effects. She will drop to 30 mg starting now and then will be evaluated by the clinical pain pharmacist.      A: Patient having difficulty on Duloxetine     R: Recommended that the patient drop back to Duloxetine 30 mg and keep appointment with clinical pharmacy team on 11/18/2022      Current Analgesic Regimen:   Duloxetine 30 mg daily- started 10/05/22, decreased to 30 mg on 11/13/22    Oxycodone 5 mg- tapered off 10/24/22  Tylenol 1000 mg 3 or 4 times a day   Meloxicam  Prior Medication Trial(s)  Max Dose (Duration) Timeline Pain Efficacy (Y/N/Unknown) Side Effects Reason for discontinuation   gabapentin    Y- unknown    nortriptyline    Y- heart palpitations              [place]MP Medication Dispense History (from 11/23/2021 to 11/18/2022)     Dispensed Days Supply Quantity   OXYCODONE HCL (IR) 5 MG TABLET 10/10/2022 15 30 unspecified   OXYCODONE HCL (IR) 5 MG TABLET 09/30/2022 11 42 unspecified   OXYCODONE HCL (IR) 5 MG TABLET 09/21/2022 4 30 unspecified   OXYCODONE HCL (IR) 5 MG TABLET 09/14/2022 4 30 unspecified   OXYCODONE HCL (IR) 5 MG TABLET 09/09/2022 4 30 unspecified   OXYCODONE HCL (IR) 5 MG TABLET 08/31/2022 4 30 unspecified    OXYCODONE HCL (IR) 5 MG TABLET 08/23/2022 5 42 unspecified   DIAZEPAM 5 MG TABLET 07/01/2022 4 8 unspecified     I reviewed the [place] state prescription drug monitoring program and it is consistent with [facility] prescriptions and known controlled substance prescriptions.     SUBJECTIVE:  Patient reports that when she started taking duloxetine 60 mg in the morning she became too drowsy. She compared this to when she would take duloxetine at night where she is able to fall asleep but wakes up an hour after and struggles to sleep the rest of the night. Additionally, since increasing the dose, she has been experiencing more nausea and diarrhea. After calling our clinic on 11/13/22, she decreased the dose back down to 30 mg. She has been experiencing less nausea and diarrhea, and is able to sleep better. Overall, with duloxetine she hasn't noticed much improvement in pain. She states that she would like to be off duloxetine before her procedure in March, as she wants to see how the surgery will help with her chronic pain.     OBJECTIVE:    Creatinine (mg/dL)   Date Value   08/31/2022 0.49       Albumin   Date Value Ref Range Status   08/26/2022 4.0 3.5 - 4.9 g/dL Final     Bilirubin (Total)   Date Value Ref Range Status   08/25/2022 0.4 0.2 - 1.3 mg/dL Final     No results found for: "BILIRUBNDIR"  AST (GOT)   Date Value Ref Range Status   08/25/2022 22 9 - 38 U/L Final     Alkaline Phosphatase (Total)   Date Value Ref Range Status   08/25/2022 59 34 - 121 U/L Final     No results found for: "ALA"    ASSESSMENT/PLAN:  Patient has not received much benefit from duloxetine and has experienced more side effects at an increased dose. Per patient's request, she would like to taper off duloxetine and re-evaluate after her procedures. Patient will take duloxetine 30 mg for another 7 days then stop. Patient will follow up with Dr. Weston Anna in clinic on 11/20/22.    Pharmacist Follow-up: as needed  Next Provider Follow-up:  11/20/2022 with Dr. Weston Anna    Prescriptions Written during Encounter: none needed  No orders of the defined types were placed in this encounter.       I called and spoke to the patient over the telephone for this encounter. I spent a total time of 9 minutes on the phone with the patient, of which more than 50% was spent counseling as outlined in this note.     Bobetta Lime  Student Pharmacist  [facility]

## 2022-11-20 ENCOUNTER — Encounter (INDEPENDENT_AMBULATORY_CARE_PROVIDER_SITE_OTHER): Payer: Self-pay | Admitting: Orthopaedic Surgery

## 2022-11-20 ENCOUNTER — Ambulatory Visit
Payer: No Typology Code available for payment source | Admitting: Student in an Organized Health Care Education/Training Program

## 2022-11-20 ENCOUNTER — Telehealth (INDEPENDENT_AMBULATORY_CARE_PROVIDER_SITE_OTHER): Payer: Self-pay | Admitting: Orthopaedic Surgery

## 2022-11-20 ENCOUNTER — Other Ambulatory Visit (HOSPITAL_BASED_OUTPATIENT_CLINIC_OR_DEPARTMENT_OTHER): Payer: Self-pay | Admitting: Rheumatology

## 2022-11-20 DIAGNOSIS — Z96642 Presence of left artificial hip joint: Secondary | ICD-10-CM

## 2022-11-20 DIAGNOSIS — M0579 Rheumatoid arthritis with rheumatoid factor of multiple sites without organ or systems involvement: Secondary | ICD-10-CM

## 2022-11-20 NOTE — Telephone Encounter (Signed)
PT referral faxed today

## 2022-11-20 NOTE — Telephone Encounter (Signed)
Patient scheduled L THA complex DOS 12/22/22 with Dr. Catalina Gravel.    Patient requesting post-op PT referral to be sent to:  [facility]  [address], [place] [id number]  P: [phone]  F: [phone]    Patient aware Dr. Catalina Gravel wants her to start PT 4-7 days post-op.    Routing to clinical staff to further assist.

## 2022-11-23 MED ORDER — CIMZIA (2 SYRINGE) 200 MG/ML SC PSKT
PREFILLED_SYRINGE | SUBCUTANEOUS | 0 refills | Status: DC
Start: 2022-11-23 — End: 2022-12-16

## 2022-11-24 ENCOUNTER — Telehealth (HOSPITAL_BASED_OUTPATIENT_CLINIC_OR_DEPARTMENT_OTHER): Payer: Self-pay | Admitting: Neurological Surgery

## 2022-11-24 NOTE — Telephone Encounter (Signed)
RETURN CALL: Voicemail - Detailed Message      SUBJECT:  Appointment Request     REASON FOR VISIT: 2 month follow up, due late March per Reesa Chew, MD  PREFERRED DATE/TIME: Fridays preferred, mornings; however is flexible with other days   [place] APPOINT: no recall available, [facility] unable to connect to clinic. Please further assist. Thanks.

## 2022-11-25 ENCOUNTER — Other Ambulatory Visit (HOSPITAL_BASED_OUTPATIENT_CLINIC_OR_DEPARTMENT_OTHER): Payer: Self-pay

## 2022-11-30 ENCOUNTER — Encounter (HOSPITAL_COMMUNITY): Payer: Self-pay

## 2022-12-04 ENCOUNTER — Ambulatory Visit: Payer: Medicare Other

## 2022-12-04 ENCOUNTER — Encounter (INDEPENDENT_AMBULATORY_CARE_PROVIDER_SITE_OTHER): Payer: Self-pay | Admitting: Orthopaedic Surgery

## 2022-12-04 ENCOUNTER — Ambulatory Visit (INDEPENDENT_AMBULATORY_CARE_PROVIDER_SITE_OTHER): Payer: No Typology Code available for payment source | Admitting: Orthopaedic Surgery

## 2022-12-04 VITALS — BP 114/78 | HR 119 | Ht 60.98 in | Wt 180.0 lb

## 2022-12-04 DIAGNOSIS — R6889 Other general symptoms and signs: Secondary | ICD-10-CM

## 2022-12-04 DIAGNOSIS — Z01818 Encounter for other preprocedural examination: Secondary | ICD-10-CM

## 2022-12-04 NOTE — H&P (View-Only) (Signed)
ORTHOPAEDIC HISTORY AND PHYSICAL EXAMINATION    Patient's Primary Care Physician: Terressa Koyanagi, MD  Referred by: No ref. provider found      CC: Left hip DJD.    HPI: [name] is still having significant symptoms in the Left hip. The pain is severe, despite already having tried non-operative measures, including activity modification, use of an assistive device for ambulation, neutraceuticals, oral analgesics, non steroidal anti-inflammatory medications, and a corticosteroid injection into the greater trochanteric bursa. We reviewed the alternatives to surgery and the risks of hip replacement surgery, but she feels strongly that total hip arthroplasty is the appropriate next step. The patient is currently scheduled for a L total hip arthroplasty on 12/22/2022.    Autumn Camacho has the following drug allergies: Review of patient's allergies indicates:  Allergies   Allergen Reactions    Fentanyl Unknown and Other     Per patient, she has "forgotten to breathe"    Hydromorphone [id number]    Remicade [Infliximab] Anaphylaxis    Adhesives Skin: Rash     Dermabond causes skin blistering    Codeine Unknown and Other     "Feels like her skin is crawling"  Extreme feeling of something crawling    Nortriptyline Unknown and Other     Heart palpitations         Ms. Shouse past medical history:     Patient Active Problem List   Diagnosis    Rheumatoid arthritis involving multiple sites [facility])    Chronic right hip pain    Rheumatoid arthritis ([place])    Rheumatoid arthritis involving right hand (HCC)    Arthritis of carpometacarpal (CMC) joints of both thumbs    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    Osteoarthritis of right hip    Osteoarthritis of right knee    Knee dislocation, left, initial encounter    Failed total left knee replacement (HCC)    Chronic instability of left knee    Failed total knee replacement, subsequent encounter    Status post revision of total replacement of left  knee    Chronic right-sided low back pain with sciatica    Degenerative disc disease, lumbar    Cervical stenosis of spinal canal    Weakness of both lower extremities    Depressed mood    Rheu arthritis w rheu factor of left hip w/o org/sys involv (HCC)       Past Medical History:   Diagnosis Date    Chronic right hip pain 02/26/2019    Allergic asthma     Fracture     Bilateral elbow fx    Genital herpes     Rheumatoid arthritis (HCC)     Rheumatoid arthritis (HCC)     Spinal stenosis     Tooth disorder     MISSING TOOTH UPPER LEFT BACK-MOLAR       Autumn Camacho has a current medication list which includes the following prescription(s):    Current Outpatient Medications   Medication Instructions    acetaminophen (TYLENOL) 1,000 mg, Oral, Every 6 hours PRN    certolizumab (Cimzia) 2 X 200 MG/ML prefilled syringe kit INJECT 200 MG (1 ML) UNDER THE SKIN EVERY 2 WEEKS    DULoxetine 30 MG DR capsule Start from 30 mg once a day. In 10 days increase up to 60 mg a day    Fexofenadine HCl (ALLEGRA OR) 1 tablet, Daily    leflunomide 20 MG tablet TAKE 1 TABLET DAILY  mupirocin 2 % ointment 1 Application , Topical, Daily, Apply to nose.    oxyCODONE 5 mg, Oral, Every 6 hours PRN    polyethylene glycol 3350 17 GM/SCOOP oral powder Fill cap with powder to the 17 gram mark and dissolve in 4 to 8 ounces of water. Take 17 g by mouth daily.    psyllium 58.12 % packet 1 packet, Oral, Daily PRN, Mix in 8 oz of juice or water and drink.    valACYclovir (VALTREX) 2,000 mg, Oral, Every morning        Autumn Camacho's family history:     Family History       Problem (# of Occurrences) Relation (Name,Age of Onset)    Cancer (1) Father    Heart Attack (1) Father    Rheumatoid Arthritis (1) Other             Autumn Camacho's past surgical history includes:    Past Surgical History:   Procedure Laterality Date    foot Bilateral     Arch surgery    KNEE ARTHROPLASTY      LEFT 2018 REPAIR 2019    KNEE ARTHROPLASTY Left 05/01/2020    PR  ADENOIDECTOMY PRIMARY <AGE 57      AGE 57  T/A     PR ANES; TOTAL KNEE REPLACEMENT Left 04/01/2054    PR TONSILLECTOMY ONE-HALF <AGE 40      PR UNLISTED PROCEDURE FEMUR/KNEE Bilateral     TKA    PR UNLISTED PROCEDURE FEMUR/KNEE      Left revision TKA     PR UNLISTED PROCEDURE FOOT/TOES Bilateral 2009 R,  2010 L    Reconstructive surgery    PR UNLISTED PROCEDURE PELVIS/HIP JOINT      DOS 07/30/20 R THA    TKA Left 2019    revision from prior TKA       Outpatient Medications Prior to Visit   Medication Sig Dispense Refill    acetaminophen 500 MG tablet Take 2 tablets (1,000 mg) by mouth every 6 hours as needed.      certolizumab (Cimzia) 2 X 200 MG/ML prefilled syringe kit INJECT 200 MG (1 ML) UNDER THE SKIN EVERY 2 WEEKS 1 each 0    DULoxetine 30 MG DR capsule Start from 30 mg once a day. In 10 days increase up to 60 mg a day (Patient taking differently: No sig reported) 60 capsule 2    Fexofenadine HCl (ALLEGRA OR) Take 1 tablet by mouth daily. (Patient not taking: Reported on 11/18/2022)      leflunomide 20 MG tablet TAKE 1 TABLET DAILY 90 tablet 1    mupirocin 2 % ointment Apply 1 Application  (1 g) topically daily. Apply to nose.      oxyCODONE 5 MG tablet Take 1 tablet (5 mg) by mouth every 6 hours as needed for moderate pain or severe pain. 42 tablet 0    polyethylene glycol 3350 17 GM/SCOOP oral powder Fill cap with powder to the 17 gram mark and dissolve in 4 to 8 ounces of water. Take 17 g by mouth daily. 476 g 0    psyllium 58.12 % packet Take 1 packet by mouth daily as needed for constipation. Mix in 8 oz of juice or water and drink. (Patient not taking: Reported on 11/18/2022) 30 packet 0    valACYclovir 1 g tablet Take 2 tablets (2,000 mg) by mouth every morning.       No facility-administered medications prior to visit.  ROS:  [name] reports no tingling, numbness, or weakness in the affected extremity.    EXAM:  (no height taken for this visit)  (no weight taken for this visit)  There is no  height or weight on file to calculate BMI.    HEENT Negative  Chest clear  Coronary RRR  Abdomen soft    Exam of the Left hip from 11/06/2022 appointment:  No visible atrophy. Greater trochanter is tender to palpation. Evidence of crepitus with passive rotation. Passive painful ROM is measured at 10-90 degrees extension to flexion. Evidence of guarding. Stinchfield test is positive. Unable to do SLR.    Skin temperature and turgor are normal throughout the lower extremities, without swelling, varicosities, or lymphadenopathy     Distal Neurocirculatory Exam: Pulses intact (2+ PT and DP) bilaterally, sensation intact to light touch bilaterally, EHL motor strength 5/5 bilaterally.     XRAYS:  Severe osteoarthritis of the Left hip, Kellgren-Lawrence Stage IV     IMPRESSION: advanced Left hip DJD.    PLAN:     At this time, the patient has persistent Left hip pain which has failed six months of non-surgical management, including:  NSAIDS, analgesics, light exercise, assistive devices, and complementary medicines.  The patient has severe hip joint disease causing functional limitations impairing age-appropriate activities of daily living.  These activities include, but are not limited to, walking, bathing, dressing, personal hygiene, and normal household chores, as well as fitness and sporting activities.       Following our discussion of options, Ms. Williquette decided that total hip arthroplasty seems a reasonable option. We discussed that THA is usually a reliable procedure, with published success rates in excess of 90% at 10 years, but that there are a number of risks associated with the procedure. We reviewed the risks of THA today. I explained that these risks include (but are not limited to): infection, bleeding, nerve injury, thromboembolic disease, medical/anesthetic risks including death, severe cardiac events, and stroke/cerebral impairment, as well as hip-specific risks such as leg-length inequality,  dislocation, instability, stiffness, limp or abnormal gait, persistent pain, and possible need for re-operation or revision surgery, either early or later on in life.      I spent some time answering Ms. Meath's questions, and made no guarantees as to outcome. she desires to proceed. We will begin the pre-operative process per our usual protocol.        This will include:  1. [facility] -- Pending Approval  Given the complexity of AutumnBohnsack's existing medical history, I organized a pre-operative consultation with the Internal Medical Consultation Clinic at [facility].  This served to allow for clearance for surgery, optimization of AutumnSally's medication, as well as allow for continued follow-up in the post-operative period until discharge.    The patient is scheduled to be medically optimized for surgery on 12/16/2022 by Dr. Sherry Ruffing over at the [facility]. We will continue with the pre-operative process.      2. Anesthesia PSC Visit: Yes     3. Autologous Blood: No     4. Implants and Special Equipment: Zimmer THA     5. DVT  and PE prophylaxis: Aspirin 81 mg every 12 hours for 6 weeks starting POD #1.  SCD's and TED hose to be applied upon arrival to hospital.  PEPPER trial may change the plan.     6. Routine labs       I, [employer], acted as Education administrator  for Dr. Catalina Gravel in documenting the service or procedure. To the best of my knowledge, I recorded what was dictated by Dr. Catalina Gravel.    I,Paul Leane Para, MD, interviewed and examined the patient while overseeing the documentation performed by my Medical Scribe, Anicia Leuthold. I have reviewed and revised as necessary the scribe's note and agree with the documented findings and plan of care. Please refer to the scribe's note below for further detailed information regarding the patient encounter and exam.  12/04/2022. 9:30 AM

## 2022-12-04 NOTE — Patient Instructions (Signed)
Preop visit main points    Surgery Type: Left total hip replacement on 12/22/2022 with Dr. Rondall Allegra    - Medicines to stop taking before surgery: Please refer to Dr. Virgilio Belling instructions. Appointment  on 12/16/2022    - Pre Anesthesia Phone call: 2/29 at 11:15AM They will notify you when to stop eating/drinking, and medications to hold the night before and morning of surgery. You may call their department if you have any questions: [phone]    - Take a daily shower using a small amount of antiseptic soap for 6 days (last shower will be the morning of surgery). After your scrubbing shower on your surgery day do not apply any products to your hair no products to your skin including cream,lotions ,no deodorant & no fragrances. Remember to remove all jewelry including all piercing of all kinds.  Start date: 12/17/2022    - Physical Therapy referral given today: First appointment should be 4-7 days after surgery.    - Pain Management: 2 business day refill policy. Please call clinic to request refills. If approved, prescriptions can be electronically sent to your preferred pharmacy.   - Elevate and ice surgical leg frequently to help with swelling post-operatively. Toes need to be above the level of your heart. Ice pack to incision site as frequently as 20 minutes on/20 minutes off.    - Blood Clot Prevention: You will be prescribed a blood thinner at discharge, take as instructed to prevent risk of blood clot after surgery.   - Wear compression socks at least until your first post op appointment, or longer as directed by your surgeon, to help with swelling post-operatively.     - Infection Prevention: Keep incision clean to prevent infection. You will receive information at the hospital about how to care for your incision and when you can shower.   - Constipation prevention!    - Prophylactic antibiotic protocol after surgery.     - For any questions or concerns, Monday - Friday 8AM-5PM call the clinic [phone]  [id number] option 2, or send a message via [place]. After hours or on the weekends, call [phone] and request the Orthopaedic Resident Physician covering for your surgeon.

## 2022-12-04 NOTE — Progress Notes (Signed)
Patient scheduled for a L THA complex 12/22/2022 with Dr. Rondall Allegra. Preoperative instruction given per [facility] protocol. [facility] 'Your Surgical Experience' booklet given. Stressed NPO status ten hours prior to surgery Explained what medications to avoid prior to surgery. 4 oz bottle of chlorhexidine soap given as well as instruction for use. Discussed signs and symptoms of possible post-operative complications including infection, DVT, and constipation. The importance of pain control, refill policy on narcotics, and prophylactic antibiotic protocol also explained. No further questions or concerns at this time, patient advised to call if needed.    R/o MRSA swabs obtained in clinic. Provided instruction for prophylactic use of CHG soap for 5 days prior to surgery. Patient stated understanding of these instructions.  .     Patient labs obtained prior to preop. Outpatient physical therapy referral also given at today's clinic visit.     Patient lives at home with her husband who will be able to assist post-operatively.    Addendum:    Patient had an allergic reaction from surgical adhesive or likely from dermabond on her previous R THA. Dr. Catalina Gravel plans to do staples for upcoming L THA.     Insurance    [employer]     Griffin Dakin, BSN, Manufacturing systems engineer Nurse  [facility] [place] [facility] [place], [place] [address]  Ph [phone] Opt 2 Fax [phone]

## 2022-12-04 NOTE — Progress Notes (Signed)
ORTHOPAEDIC HISTORY AND PHYSICAL EXAMINATION    Patient's Primary Care Physician: Terressa Koyanagi, MD  Referred by: No ref. provider found      CC: Left hip DJD.    HPI: Autumn Camacho is still having significant symptoms in the Left hip. The pain is severe, despite already having tried non-operative measures, including activity modification, use of an assistive device for ambulation, neutraceuticals, oral analgesics, non steroidal anti-inflammatory medications, and a corticosteroid injection into the greater trochanteric bursa. We reviewed the alternatives to surgery and the risks of hip replacement surgery, but she feels strongly that total hip arthroplasty is the appropriate next step. The patient is currently scheduled for a L total hip arthroplasty on 12/22/2022.    Autumn Camacho has the following drug allergies: Review of patient's allergies indicates:  Allergies   Allergen Reactions    Fentanyl Unknown and Other     Per patient, she has "forgotten to breathe"    Hydromorphone [id number]    Remicade [Infliximab] Anaphylaxis    Adhesives Skin: Rash     Dermabond causes skin blistering    Codeine Unknown and Other     "Feels like her skin is crawling"  Extreme feeling of something crawling    Nortriptyline Unknown and Other     Heart palpitations         Autumn Camacho past medical history:     Patient Active Problem List   Diagnosis    Rheumatoid arthritis involving multiple sites [facility])    Chronic right hip pain    Rheumatoid arthritis ([place])    Rheumatoid arthritis involving right hand (HCC)    Arthritis of carpometacarpal (CMC) joints of both thumbs    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    Osteoarthritis of right hip    Osteoarthritis of right knee    Knee dislocation, left, initial encounter    Failed total left knee replacement (HCC)    Chronic instability of left knee    Failed total knee replacement, subsequent encounter    Status post revision of total replacement of left  knee    Chronic right-sided low back pain with sciatica    Degenerative disc disease, lumbar    Cervical stenosis of spinal canal    Weakness of both lower extremities    Depressed mood    Rheu arthritis w rheu factor of left hip w/o org/sys involv (HCC)       Past Medical History:   Diagnosis Date    Chronic right hip pain 02/26/2019    Allergic asthma     Fracture     Bilateral elbow fx    Genital herpes     Rheumatoid arthritis (HCC)     Rheumatoid arthritis (HCC)     Spinal stenosis     Tooth disorder     MISSING TOOTH UPPER LEFT BACK-MOLAR       Autumn Camacho has a current medication list which includes the following prescription(s):    Current Outpatient Medications   Medication Instructions    acetaminophen (TYLENOL) 1,000 mg, Oral, Every 6 hours PRN    certolizumab (Cimzia) 2 X 200 MG/ML prefilled syringe kit INJECT 200 MG (1 ML) UNDER THE SKIN EVERY 2 WEEKS    DULoxetine 30 MG DR capsule Start from 30 mg once a day. In 10 days increase up to 60 mg a day    Fexofenadine HCl (ALLEGRA OR) 1 tablet, Daily    leflunomide 20 MG tablet TAKE 1 TABLET DAILY  mupirocin 2 % ointment 1 Application , Topical, Daily, Apply to nose.    oxyCODONE 5 mg, Oral, Every 6 hours PRN    polyethylene glycol 3350 17 GM/SCOOP oral powder Fill cap with powder to the 17 gram mark and dissolve in 4 to 8 ounces of water. Take 17 g by mouth daily.    psyllium 58.12 % packet 1 packet, Oral, Daily PRN, Mix in 8 oz of juice or water and drink.    valACYclovir (VALTREX) 2,000 mg, Oral, Every morning        Autumn Camacho's family history:     Family History       Problem (# of Occurrences) Relation (Name,Age of Onset)    Cancer (1) Father    Heart Attack (1) Father    Rheumatoid Arthritis (1) Other             Autumn Camacho's past surgical history includes:    Past Surgical History:   Procedure Laterality Date    foot Bilateral     Arch surgery    KNEE ARTHROPLASTY      LEFT 2018 REPAIR 2019    KNEE ARTHROPLASTY Left 05/01/2020    PR  ADENOIDECTOMY PRIMARY <AGE 9      AGE 12  T/A     PR ANES; TOTAL KNEE REPLACEMENT Left 04/01/2020    PR TONSILLECTOMY ONE-HALF <AGE 9      PR UNLISTED PROCEDURE FEMUR/KNEE Bilateral     TKA    PR UNLISTED PROCEDURE FEMUR/KNEE      Left revision TKA     PR UNLISTED PROCEDURE FOOT/TOES Bilateral 2009 R,  2010 L    Reconstructive surgery    PR UNLISTED PROCEDURE PELVIS/HIP JOINT      DOS 07/30/20 R THA    TKA Left 2019    revision from prior TKA       Outpatient Medications Prior to Visit   Medication Sig Dispense Refill    acetaminophen 500 MG tablet Take 2 tablets (1,000 mg) by mouth every 6 hours as needed.      certolizumab (Cimzia) 2 X 200 MG/ML prefilled syringe kit INJECT 200 MG (1 ML) UNDER THE SKIN EVERY 2 WEEKS 1 each 0    DULoxetine 30 MG DR capsule Start from 30 mg once a day. In 10 days increase up to 60 mg a day (Patient taking differently: No sig reported) 60 capsule 2    Fexofenadine HCl (ALLEGRA OR) Take 1 tablet by mouth daily. (Patient not taking: Reported on 11/18/2022)      leflunomide 20 MG tablet TAKE 1 TABLET DAILY 90 tablet 1    mupirocin 2 % ointment Apply 1 Application  (1 g) topically daily. Apply to nose.      oxyCODONE 5 MG tablet Take 1 tablet (5 mg) by mouth every 6 hours as needed for moderate pain or severe pain. 42 tablet 0    polyethylene glycol 3350 17 GM/SCOOP oral powder Fill cap with powder to the 17 gram mark and dissolve in 4 to 8 ounces of water. Take 17 g by mouth daily. 476 g 0    psyllium 58.12 % packet Take 1 packet by mouth daily as needed for constipation. Mix in 8 oz of juice or water and drink. (Patient not taking: Reported on 11/18/2022) 30 packet 0    valACYclovir 1 g tablet Take 2 tablets (2,000 mg) by mouth every morning.       No facility-administered medications prior to visit.  ROS:  Autumn Camacho reports no tingling, numbness, or weakness in the affected extremity.    EXAM:  (no height taken for this visit)  (no weight taken for this visit)  There is no  height or weight on file to calculate BMI.    HEENT Negative  Chest clear  Coronary RRR  Abdomen soft    Exam of the Left hip from 11/06/2022 appointment:  No visible atrophy. Greater trochanter is tender to palpation. Evidence of crepitus with passive rotation. Passive painful ROM is measured at 10-90 degrees extension to flexion. Evidence of guarding. Stinchfield test is positive. Unable to do SLR.    Skin temperature and turgor are normal throughout the lower extremities, without swelling, varicosities, or lymphadenopathy     Distal Neurocirculatory Exam: Pulses intact (2+ PT and DP) bilaterally, sensation intact to light touch bilaterally, EHL motor strength 5/5 bilaterally.     XRAYS:  Severe osteoarthritis of the Left hip, Kellgren-Lawrence Stage IV     IMPRESSION: advanced Left hip DJD.    PLAN:     At this time, the patient has persistent Left hip pain which has failed six months of non-surgical management, including:  NSAIDS, analgesics, light exercise, assistive devices, and complementary medicines.  The patient has severe hip joint disease causing functional limitations impairing age-appropriate activities of daily living.  These activities include, but are not limited to, walking, bathing, dressing, personal hygiene, and normal household chores, as well as fitness and sporting activities.       Following our discussion of options, Ms. Marier decided that total hip arthroplasty seems a reasonable option. We discussed that THA is usually a reliable procedure, with published success rates in excess of 90% at 10 years, but that there are a number of risks associated with the procedure. We reviewed the risks of THA today. I explained that these risks include (but are not limited to): infection, bleeding, nerve injury, thromboembolic disease, medical/anesthetic risks including death, severe cardiac events, and stroke/cerebral impairment, as well as hip-specific risks such as leg-length inequality,  dislocation, instability, stiffness, limp or abnormal gait, persistent pain, and possible need for re-operation or revision surgery, either early or later on in life.      I spent some time answering Ms. Rockholt's questions, and made no guarantees as to outcome. she desires to proceed. We will begin the pre-operative process per our usual protocol.        This will include:  1. [facility] consult -- Pending Approval  Given the complexity of AutumnVoth's existing medical history, I organized a pre-operative consultation with the Internal Medical Consultation Clinic at [facility].  This served to allow for clearance for surgery, optimization of AutumnScobie's medication, as well as allow for continued follow-up in the post-operative period until discharge.    The patient is scheduled to be medically optimized for surgery on 12/16/2022 by Dr. Sherry Ruffing over at the [facility]. We will continue with the pre-operative process.      2. Anesthesia PSC Visit: Yes     3. Autologous Blood: No     4. Implants and Special Equipment: Zimmer THA     5. DVT  and PE prophylaxis: Aspirin 81 mg every 12 hours for 6 weeks starting POD #1.  SCD's and TED hose to be applied upon arrival to hospital.  PEPPER trial may change the plan.     6. Routine labs       I, [employer], acted as Education administrator  for Dr. Catalina Gravel in documenting the service or procedure. To the best of my knowledge, I recorded what was dictated by Dr. Catalina Gravel.    I,Paul Leane Para, MD, interviewed and examined the patient while overseeing the documentation performed by my Medical Scribe, Yafet Cline. I have reviewed and revised as necessary the scribe's note and agree with the documented findings and plan of care. Please refer to the scribe's note below for further detailed information regarding the patient encounter and exam.  12/04/2022. 9:30 AM

## 2022-12-05 LAB — R/O MRSA

## 2022-12-08 ENCOUNTER — Telehealth (HOSPITAL_COMMUNITY): Payer: Self-pay

## 2022-12-08 NOTE — Telephone Encounter (Signed)
Current Outpatient Medications:     acetaminophen 500 MG tablet, Take 2 tablets (1,000 mg) by mouth every 6 hours as needed for pain., Disp: , Rfl:     certolizumab (Cimzia) 2 X 200 MG/ML prefilled syringe kit, INJECT 200 MG (1 ML) UNDER THE SKIN EVERY 2 WEEKS, Disp: 1 each, Rfl: 0    DULoxetine 30 MG DR capsule, Start from 30 mg once a day. In 10 days increase up to 60 mg a day (Patient taking differently: Take 1 capsule (30 mg) by mouth every evening.), Disp: 60 capsule, Rfl: 2    leflunomide 20 MG tablet, TAKE 1 TABLET DAILY (Patient taking differently: Take 1 tablet (20 mg) by mouth every evening.), Disp: 90 tablet, Rfl: 1    meloxicam 7.5 MG tablet, Take 1 tablet (7.5 mg) by mouth 2 times a day., Disp: , Rfl:     Psyllium Husk powder, Take 5.6 g by mouth at bedtime., Disp: , Rfl:     valACYclovir 1 g tablet, Take 1 tablet (1,000 mg) by mouth every morning., Disp: , Rfl:     Medlist reviewed by RX via patient, records from [facility] mail order rx and [facility] mail order rx.

## 2022-12-15 ENCOUNTER — Encounter (HOSPITAL_COMMUNITY): Payer: Self-pay

## 2022-12-15 ENCOUNTER — Other Ambulatory Visit (HOSPITAL_BASED_OUTPATIENT_CLINIC_OR_DEPARTMENT_OTHER): Payer: Self-pay | Admitting: Rheumatology

## 2022-12-15 DIAGNOSIS — M0579 Rheumatoid arthritis with rheumatoid factor of multiple sites without organ or systems involvement: Secondary | ICD-10-CM

## 2022-12-15 NOTE — Progress Notes (Signed)
Ortho Pre-Admission Data   Surgeon Name: Manner  Surgical Procedure: LEFT THA  Surgery Date:  12/22/22     Information Source   Chart Review   Patient      Prior Functional / Medical Information   Medical History   6/21 Revision left TKA  Right total knee arthroplasty, July 2018  Left total knee arthroplasty, May 2018  Left revision total knee arthroplasty, June 2019   RA  Lumbar back pain  01/16/20 dislocation left knee requiring treatment the the ED  10/21 right THA    Previous Functional Level   Uses a 2-wheeled walker Independent DLs.     DME needs:  Already owns a walker and will bring it to the hospital.     Current Decision Maker   Patient      Cognitive Status   Alert   Oriented x4      Pre-Admission Living Situation  Lives with her husband Autumn.     Architectural Considerations / Barriers   House: 3 steps in/ will stay on the first floor    Uses a shower bench taht is built into her shower, bedside commode and a transfer belt.    Caregiver Support available for 1 week at D/C?   Husband:  Autumn Camacho      ANTICIPATED DISCHARGE PLAN  Discharge Address   Will return to [facility] address     Outpatient Therapy   [facility] - still needs to set start date     Transportation to / from [facility]   husband, Autumn Camacho [phone]

## 2022-12-16 ENCOUNTER — Ambulatory Visit: Payer: No Typology Code available for payment source | Attending: Internal Medicine | Admitting: Internal Medicine

## 2022-12-16 ENCOUNTER — Other Ambulatory Visit (INDEPENDENT_AMBULATORY_CARE_PROVIDER_SITE_OTHER): Payer: Self-pay | Admitting: Orthopaedic Surgery

## 2022-12-16 ENCOUNTER — Encounter (HOSPITAL_COMMUNITY): Payer: Self-pay

## 2022-12-16 VITALS — BP 132/88 | HR 101 | Ht 60.0 in | Wt 175.6 lb

## 2022-12-16 DIAGNOSIS — A6000 Herpesviral infection of urogenital system, unspecified: Secondary | ICD-10-CM | POA: Insufficient documentation

## 2022-12-16 DIAGNOSIS — Z01818 Encounter for other preprocedural examination: Secondary | ICD-10-CM | POA: Insufficient documentation

## 2022-12-16 DIAGNOSIS — M05752 Rheumatoid arthritis with rheumatoid factor of left hip without organ or systems involvement: Secondary | ICD-10-CM | POA: Insufficient documentation

## 2022-12-16 DIAGNOSIS — G8929 Other chronic pain: Secondary | ICD-10-CM | POA: Insufficient documentation

## 2022-12-16 DIAGNOSIS — Z96642 Presence of left artificial hip joint: Secondary | ICD-10-CM

## 2022-12-16 DIAGNOSIS — E6609 Other obesity due to excess calories: Secondary | ICD-10-CM | POA: Insufficient documentation

## 2022-12-16 DIAGNOSIS — M25552 Pain in left hip: Secondary | ICD-10-CM | POA: Insufficient documentation

## 2022-12-16 DIAGNOSIS — D7589 Other specified diseases of blood and blood-forming organs: Secondary | ICD-10-CM | POA: Insufficient documentation

## 2022-12-16 DIAGNOSIS — S22018S Other fracture of first thoracic vertebra, sequela: Secondary | ICD-10-CM | POA: Insufficient documentation

## 2022-12-16 DIAGNOSIS — M059 Rheumatoid arthritis with rheumatoid factor, unspecified: Secondary | ICD-10-CM | POA: Insufficient documentation

## 2022-12-16 DIAGNOSIS — T84498A Other mechanical complication of other internal orthopedic devices, implants and grafts, initial encounter: Secondary | ICD-10-CM | POA: Insufficient documentation

## 2022-12-16 DIAGNOSIS — R030 Elevated blood-pressure reading, without diagnosis of hypertension: Secondary | ICD-10-CM

## 2022-12-16 DIAGNOSIS — Z6834 Body mass index (BMI) 34.0-34.9, adult: Secondary | ICD-10-CM | POA: Insufficient documentation

## 2022-12-16 LAB — EKG 12 LEAD
Atrial Rate: 91 {beats}/min
P Axis: 50 degrees
P-R Interval: 120 ms
Q-T Interval: 362 ms
QRS Duration: 70 ms
QTC Calculation: 445 ms
R Axis: -3 degrees
T Axis: 32 degrees
Ventricular Rate: 91 {beats}/min

## 2022-12-16 MED ORDER — CIMZIA (2 SYRINGE) 200 MG/ML SC PSKT
PREFILLED_SYRINGE | SUBCUTANEOUS | 0 refills | Status: DC
Start: 2022-12-16 — End: 2023-01-06
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
## 2022-12-16 NOTE — Patient Instructions (Addendum)
[facility] MEDICINE - [facility]  [address], [place] [address]  [place] [address]  Phone: [phone]  Fax: [phone]    Modern-day surgery requires teamwork.  At [facility], we evaluate medical risk for surgery, optimize medical conditions--and work as a team with you, your primary providers, and your surgical team before surgery to provide you with the best possible care.     Your medical history and how it affects your surgery was discussed with you today.  If you have questions about what was discussed, including the medication recommendations listed below, please call our clinic at the phone number above.  E-care messages do eventually reach [place], but are often delayed.  If you have significant changes to your health--or if your medications change between now and the time of surgery--please contact our clinic and/or your surgeon.  It will typically take at least one business day for [place] to respond.    Note that we are consultants; your primary providers remain your primary providers--and your surgeon remains your primary provider for this surgery. If you have questions about whether you will be scheduled for surgery or when your surgery will be, please contact your surgeon.    PERIOPERATIVE MEDICATION AND CARE RECOMMENDATIONS  Careful medication management before and after surgery can help to reduce the risk of surgical complications.  Your usual medication routine may need to be modified before, during, and after surgery. We recommend that you continue your current medications as usual, except as noted below:    Before the day of surgery:  - Stop nonsteroidal anti-inflammatory medications (NSAIDs) such as ibuprofen (Advil, Motrin), naproxen (Aleve), diclofenac (Voltaren), indomethacin (Indocin), ketorolac (Toradol), or meloxicam (Mobic) 3 days prior to surgery.  Tylenol is OK.    - Remember not to take the Cimzia on Monday prior to your surgery.    Take duloxetine &  leflunomide as usual the evening prior to surgery.    On the morning of surgery:  - Take ONLY the following medications (no others) with sips of water, not more than 2 ounces or 4 tablespoons: valacyclovir.  - Continue any eye drops and inhalers as usual.    ADDITIONAL RECOMMENDATIONS:   - Please bring copies of your advance directives (power of attorney for healthcare and/or living will) to the hospital with you on the day of surgery, if you wish.  - For the long-term (not required for surgery), follow-up with your primary provider to keep tabs on your blood pressure.  It is slightly increased, likely due to Cymbalta.  Also, to follow-up on your slightly large red blood cells: ). Correction: after you left, I found that B12 and folate were checked outside [place] and were normal.

## 2022-12-17 ENCOUNTER — Ambulatory Visit: Payer: No Typology Code available for payment source

## 2022-12-17 ENCOUNTER — Encounter (HOSPITAL_COMMUNITY): Payer: Self-pay

## 2022-12-17 ENCOUNTER — Other Ambulatory Visit: Payer: Self-pay

## 2022-12-17 DIAGNOSIS — S22019A Unspecified fracture of first thoracic vertebra, initial encounter for closed fracture: Secondary | ICD-10-CM | POA: Insufficient documentation

## 2022-12-17 DIAGNOSIS — A6000 Herpesviral infection of urogenital system, unspecified: Secondary | ICD-10-CM | POA: Insufficient documentation

## 2022-12-17 DIAGNOSIS — E6609 Other obesity due to excess calories: Secondary | ICD-10-CM | POA: Insufficient documentation

## 2022-12-17 DIAGNOSIS — T84498A Other mechanical complication of other internal orthopedic devices, implants and grafts, initial encounter: Secondary | ICD-10-CM | POA: Insufficient documentation

## 2022-12-17 NOTE — Preprocedure Instructions (Signed)
Pre-Surgery Instructions:   Medication Instructions    acetaminophen 500 MG tablet Take AS NEEDED on the morning of surgery with sips of water ONLY .     amoxicillin 500 MG capsule prior to dental appt. N/A.    certolizumab (Cimzia) 2 X 200 MG/ML prefilled syringe kit Follow Prescribing MD's instructions .     DULoxetine 30 MG DR capsule Take usual evening dose.     leflunomide 20 MG tablet Follow Prescribing MD's instructions  - if none may take usual evening dose.    meloxicam 7.5 MG tablet Hold 3  days PRIOR to surgery - per Dr. Lorre Nick instructions .    metroNIDAZOLE 0.75 % cream Hold on the morning of surgery.     Psyllium Husk powder Take usual bedtime dose.     valACYclovir 1 g tablet Take on the morning of surgery with sips of water ONLY.      Arrive on March 5,2024  at [facility] entrance ([place], [place] -  parking lot F) at 9:15 am  for your 11:15 am  surgery.     Surgery length is 80  minutes  plus approximately 2 hours in Recovery Room . If being admitted, you can anticipate being taken to your room 1-2 hours after surgery completed.    Eating/Drinking Guidelines: Unless otherwise instructed by surgeon*.  Note if your surgery time changes, you must adjust eating/drinking to fit to below timelines, otherwise your surgery may be cancelled.    *Patients with diabetes who have delayed gastric emptying or gastroparesis may require longer fasting times.  Patients needing bowel prep may have different instructions.    Up to 8 Hours prior to surgery check-in. Light meal only. Meal must be completed 8 hours BEFORE  surgery check in/ARRIVAL !    Up to 2 Hours prior to surgery check-in. Clear Liquids only: water, apple juice, cranberry juice, Gatorade, black coffee (not espresso) or tea are allowed; no dairy, creamer, no sugar no sugar substitute . Last 8 ounces (1 cup) must be completed 2 hours BEFORE  check in/ARRIVAL !      No gum, mints, candy, cough  drops.      Follow Dr.Manner's instructions regarding scrubbing shower with the antibacterial liquids soap Dynahex / Hibiclens. After your scrubbing shower on your surgery day do not apply any products to your hair no products to your skin including cream,lotions ,no deodorant & no fragrances. Remember to remove all jewelry including all piercing of all kinds.    Wear loose comfortable clothing. You may wish to wear a button style shirt if your surgery is above the waist.     Please remove & leave all jewelry at home, including wedding rings, watches, necklaces and all piercings.  Plastic spacers are okay-except in mouth, lips, nostrils or in surgical area.      Please bring eyeglass case if you have one.  You may not wear contact lenses during surgery.    If spending night, you may bring a few essentials(like toothbrush, brush, phone charger).    Bring your insurance card and photo identification day of surgery.    If you have questions about anesthesia or day of surgery please call [phone].    If you have questions about surgery or aftercare please contact the surgeon.    If you are running late  or any questions / concerns on the day of surgery please call Pre Surgical Admit Nurse [phone]  [phone].    Parking is $10 per day.  In/out of campus under 30 minutes is free.
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
## 2022-12-18 HISTORY — PX: TOTAL HIP ARTHROPLASTY: SHX124

## 2022-12-22 ENCOUNTER — Encounter (HOSPITAL_COMMUNITY): Payer: Self-pay | Admitting: Orthopaedic Surgery

## 2022-12-22 ENCOUNTER — Other Ambulatory Visit: Payer: Self-pay

## 2022-12-22 ENCOUNTER — Encounter (HOSPITAL_COMMUNITY)
Admission: RE | Disposition: A | Discharge status: Home / Self Care | Payer: Self-pay | Source: Home / Self Care | Attending: Orthopaedic Surgery

## 2022-12-22 ENCOUNTER — Ambulatory Visit (HOSPITAL_BASED_OUTPATIENT_CLINIC_OR_DEPARTMENT_OTHER): Payer: No Typology Code available for payment source

## 2022-12-22 ENCOUNTER — Ambulatory Visit (HOSPITAL_BASED_OUTPATIENT_CLINIC_OR_DEPARTMENT_OTHER): Payer: No Typology Code available for payment source | Admitting: Certified Registered"

## 2022-12-22 ENCOUNTER — Ambulatory Visit (HOSPITAL_COMMUNITY): Payer: Medicare Other | Admitting: Orthopaedic Surgery

## 2022-12-22 ENCOUNTER — Ambulatory Visit (HOSPITAL_COMMUNITY): Payer: No Typology Code available for payment source | Admitting: Certified Registered"

## 2022-12-22 ENCOUNTER — Ambulatory Visit
Admission: RE | Admit: 2022-12-22 | Discharge: 2022-12-23 | Disposition: A | Discharge status: Home / Self Care | Payer: No Typology Code available for payment source | Attending: Orthopaedic Surgery | Admitting: Orthopaedic Surgery

## 2022-12-22 DIAGNOSIS — M06841 Other specified rheumatoid arthritis, right hand: Secondary | ICD-10-CM | POA: Insufficient documentation

## 2022-12-22 DIAGNOSIS — S22019A Unspecified fracture of first thoracic vertebra, initial encounter for closed fracture: Secondary | ICD-10-CM | POA: Insufficient documentation

## 2022-12-22 DIAGNOSIS — Z8619 Personal history of other infectious and parasitic diseases: Secondary | ICD-10-CM

## 2022-12-22 DIAGNOSIS — M05752 Rheumatoid arthritis with rheumatoid factor of left hip without organ or systems involvement: Secondary | ICD-10-CM | POA: Insufficient documentation

## 2022-12-22 DIAGNOSIS — Z87891 Personal history of nicotine dependence: Secondary | ICD-10-CM | POA: Insufficient documentation

## 2022-12-22 DIAGNOSIS — M1612 Unilateral primary osteoarthritis, left hip: Secondary | ICD-10-CM

## 2022-12-22 DIAGNOSIS — M48061 Spinal stenosis, lumbar region without neurogenic claudication: Secondary | ICD-10-CM | POA: Insufficient documentation

## 2022-12-22 DIAGNOSIS — X58XXXA Exposure to other specified factors, initial encounter: Secondary | ICD-10-CM | POA: Insufficient documentation

## 2022-12-22 DIAGNOSIS — Z96641 Presence of right artificial hip joint: Secondary | ICD-10-CM | POA: Insufficient documentation

## 2022-12-22 DIAGNOSIS — K5790 Diverticulosis of intestine, part unspecified, without perforation or abscess without bleeding: Secondary | ICD-10-CM | POA: Insufficient documentation

## 2022-12-22 DIAGNOSIS — R69 Illness, unspecified: Secondary | ICD-10-CM

## 2022-12-22 DIAGNOSIS — T84038A Mechanical loosening of other internal prosthetic joint, initial encounter: Secondary | ICD-10-CM | POA: Insufficient documentation

## 2022-12-22 DIAGNOSIS — S22029A Unspecified fracture of second thoracic vertebra, initial encounter for closed fracture: Secondary | ICD-10-CM | POA: Insufficient documentation

## 2022-12-22 DIAGNOSIS — Z981 Arthrodesis status: Secondary | ICD-10-CM | POA: Insufficient documentation

## 2022-12-22 DIAGNOSIS — Z79899 Other long term (current) drug therapy: Secondary | ICD-10-CM | POA: Insufficient documentation

## 2022-12-22 DIAGNOSIS — M169 Osteoarthritis of hip, unspecified: Secondary | ICD-10-CM

## 2022-12-22 DIAGNOSIS — M4802 Spinal stenosis, cervical region: Secondary | ICD-10-CM | POA: Insufficient documentation

## 2022-12-22 DIAGNOSIS — Z1152 Encounter for screening for COVID-19: Secondary | ICD-10-CM | POA: Insufficient documentation

## 2022-12-22 DIAGNOSIS — Y752 Prosthetic and other implants, materials and neurological devices associated with adverse incidents: Secondary | ICD-10-CM | POA: Insufficient documentation

## 2022-12-22 DIAGNOSIS — R32 Unspecified urinary incontinence: Secondary | ICD-10-CM | POA: Insufficient documentation

## 2022-12-22 DIAGNOSIS — M180 Bilateral primary osteoarthritis of first carpometacarpal joints: Secondary | ICD-10-CM | POA: Insufficient documentation

## 2022-12-22 HISTORY — PX: HIP ARTHROPLASTY: SHX981

## 2022-12-22 LAB — SARS-COV-2 (COVID-19) QUALITATIVE RAPID PCR: COVID-19 Coronavirus Qual PCR Result: NOT DETECTED

## 2022-12-22 LAB — GLUCOSE, FINGERSTICK POC: Glucose, Finger Stick POC: 93 mg/dL (ref 62–125)

## 2022-12-22 SURGERY — ARTHROPLASTY, HIP, TOTAL, COMPLEX
Anesthesia: Regional | Site: Hip | Laterality: Left | Wound class: Class I/ Clean

## 2022-12-22 MED ORDER — PHENYLEPHRINE HCL-NACL 1-0.9 MG/10ML-% IV SOSY
PREFILLED_SYRINGE | INTRAVENOUS | Status: DC | PRN
Start: 2022-12-22 — End: 2022-12-22
  Administered 2022-12-22 (×3): 100 ug via INTRAVENOUS

## 2022-12-22 MED ORDER — PREGABALIN 50 MG OR CAPS
50.0000 mg | ORAL_CAPSULE | ORAL | Status: AC
Start: 2022-12-22 — End: 2022-12-22
  Administered 2022-12-22: 50 mg via ORAL
  Filled 2022-12-22: qty 1

## 2022-12-22 MED ORDER — PROPOFOL 1000 MG/100ML IV EMUL
INTRAVENOUS | Status: AC
Start: 2022-12-22 — End: 2022-12-22
  Filled 2022-12-22: qty 100

## 2022-12-22 MED ORDER — CEFAZOLIN SODIUM-DEXTROSE 2-4 GM/100ML-% IV SOLN
2.0000 g | INTRAVENOUS | Status: AC
Start: 2022-12-22 — End: 2022-12-22
  Administered 2022-12-22: 2 g via INTRAVENOUS
  Filled 2022-12-22: qty 100

## 2022-12-22 MED ORDER — ACETAMINOPHEN 10 MG/ML IV SOLN
1000.0000 mg | Freq: Once | INTRAVENOUS | Status: AC
Start: 2022-12-22 — End: 2022-12-22
  Administered 2022-12-22: 1000 mg via INTRAVENOUS
  Filled 2022-12-22: qty 100

## 2022-12-22 MED ORDER — SODIUM CHLORIDE 0.9 % IR SOLN
Status: DC | PRN
Start: 2022-12-22 — End: 2022-12-22
  Administered 2022-12-22: 3000 mL
  Administered 2022-12-22: 1000 mL

## 2022-12-22 MED ORDER — BUPIVACAINE-EPINEPHRINE (PF) 0.5% -1:200000 IJ SOLN
INTRAMUSCULAR | Status: DC | PRN
Start: 2022-12-22 — End: 2022-12-22
  Administered 2022-12-22: 30 mL via INTRAMUSCULAR

## 2022-12-22 MED ORDER — POLYETHYLENE GLYCOL 3350 17 G OR PACK
17.0000 g | PACK | Freq: Every day | ORAL | Status: DC
Start: 2022-12-22 — End: 2022-12-23
  Filled 2022-12-22 (×2): qty 1

## 2022-12-22 MED ORDER — LACTATED RINGERS IV SOLN
100.0000 mL/h | INTRAVENOUS | Status: DC
Start: 2022-12-22 — End: 2022-12-22

## 2022-12-22 MED ORDER — ASPIRIN 81 MG OR TBEC
81.0000 mg | DELAYED_RELEASE_TABLET | Freq: Two times a day (BID) | ORAL | Status: DC
Start: 2022-12-23 — End: 2022-12-23
  Administered 2022-12-23: 81 mg via ORAL
  Filled 2022-12-22: qty 1

## 2022-12-22 MED ORDER — BUPIVACAINE HCL (PF) 0.5 % IJ SOLN
INTRAMUSCULAR | Status: AC
Start: 2022-12-22 — End: 2022-12-22
  Filled 2022-12-22: qty 30

## 2022-12-22 MED ORDER — CEFAZOLIN SODIUM-DEXTROSE 2-4 GM/100ML-% IV SOLN
2.0000 g | Freq: Three times a day (TID) | INTRAVENOUS | Status: AC
Start: 2022-12-22 — End: 2022-12-23
  Administered 2022-12-22 – 2022-12-23 (×2): 2 g via INTRAVENOUS
  Filled 2022-12-22 (×2): qty 100

## 2022-12-22 MED ORDER — BISACODYL 5 MG OR TBEC
10.0000 mg | DELAYED_RELEASE_TABLET | Freq: Every day | ORAL | Status: DC | PRN
Start: 2022-12-22 — End: 2022-12-23

## 2022-12-22 MED ORDER — FENTANYL CITRATE (PF) 50 MCG/ML IJ SOLN WRAPPER (ANESTHESIA OSM ONLY)
INTRAMUSCULAR | Status: DC | PRN
Start: 2022-12-22 — End: 2022-12-22
  Administered 2022-12-22: 100 ug via INTRAVENOUS

## 2022-12-22 MED ORDER — EPHEDRINE SULFATE (PRESSORS) 25 MG/5ML IV SOSY
PREFILLED_SYRINGE | INTRAVENOUS | Status: AC
Start: 2022-12-22 — End: 2022-12-22
  Filled 2022-12-22: qty 5

## 2022-12-22 MED ORDER — TRAMADOL HCL 50 MG OR TABS
50.0000 mg | ORAL_TABLET | Freq: Four times a day (QID) | ORAL | Status: DC | PRN
Start: 2022-12-22 — End: 2022-12-23

## 2022-12-22 MED ORDER — ACETAMINOPHEN 500 MG OR TABS
1000.0000 mg | ORAL_TABLET | Freq: Three times a day (TID) | ORAL | Status: DC
Start: 2022-12-22 — End: 2022-12-23
  Administered 2022-12-22 – 2022-12-23 (×3): 1000 mg via ORAL
  Filled 2022-12-22 (×3): qty 2

## 2022-12-22 MED ORDER — BUPIVACAINE IN DEXTROSE 0.75-8.25 % IT SOLN
INTRATHECAL | Status: DC | PRN
Start: 2022-12-22 — End: 2022-12-22
  Administered 2022-12-22: 1.4 mL via INTRATHECAL

## 2022-12-22 MED ORDER — DEXAMETHASONE SODIUM PHOSPHATE 4 MG/ML IJ SOLN
INTRAMUSCULAR | Status: DC | PRN
Start: 2022-12-22 — End: 2022-12-22
  Administered 2022-12-22: 4 mg via INTRAVENOUS

## 2022-12-22 MED ORDER — MELATONIN 3 MG OR TABS
3.0000 mg | ORAL_TABLET | Freq: Every evening | ORAL | Status: DC | PRN
Start: 2022-12-22 — End: 2022-12-23

## 2022-12-22 MED ORDER — CELECOXIB 200 MG OR CAPS
400.0000 mg | ORAL_CAPSULE | ORAL | Status: AC
Start: 2022-12-22 — End: 2022-12-22
  Administered 2022-12-22: 400 mg via ORAL
  Filled 2022-12-22: qty 2

## 2022-12-22 MED ORDER — MIDAZOLAM HCL (PF) 2 MG/2ML IJ SOLN
INTRAMUSCULAR | Status: AC
Start: 2022-12-22 — End: 2022-12-22
  Filled 2022-12-22: qty 2

## 2022-12-22 MED ORDER — VALACYCLOVIR HCL 500 MG OR TABS
1000.0000 mg | ORAL_TABLET | Freq: Every morning | ORAL | Status: DC
Start: 2022-12-23 — End: 2022-12-23
  Administered 2022-12-23: 1000 mg via ORAL
  Filled 2022-12-22: qty 2

## 2022-12-22 MED ORDER — GLYCOPYRROLATE 0.2 MG/ML IJ SOLN
0.2000 mg | INTRAMUSCULAR | Status: DC | PRN
Start: 2022-12-22 — End: 2022-12-22

## 2022-12-22 MED ORDER — LACTATED RINGERS BOLUS
500.0000 mL | Freq: Once | INTRAVENOUS | Status: DC | PRN
Start: 2022-12-22 — End: 2022-12-22

## 2022-12-22 MED ORDER — MORPHINE SULFATE (PF) 2 MG/ML IV/IJ SOLN WRAPPER
1.0000 mg | Status: DC | PRN
Start: 2022-12-22 — End: 2022-12-23

## 2022-12-22 MED ORDER — ONDANSETRON HCL 4 MG/2ML IJ SOLN
INTRAMUSCULAR | Status: AC
Start: 2022-12-22 — End: 2022-12-22
  Filled 2022-12-22: qty 2

## 2022-12-22 MED ORDER — ONDANSETRON HCL 4 MG/2ML IJ SOLN
INTRAMUSCULAR | Status: DC | PRN
Start: 2022-12-22 — End: 2022-12-22
  Administered 2022-12-22: 4 mg via INTRAVENOUS

## 2022-12-22 MED ORDER — BUPIVACAINE-EPINEPHRINE (PF) 0.5% -1:200000 IJ SOLN
INTRAMUSCULAR | Status: AC
Start: 2022-12-22 — End: 2022-12-22
  Filled 2022-12-22: qty 30

## 2022-12-22 MED ORDER — OXYCODONE HCL 5 MG OR TABS
5.0000 mg | ORAL_TABLET | ORAL | Status: DC | PRN
Start: 2022-12-22 — End: 2022-12-23
  Administered 2022-12-22 – 2022-12-23 (×4): 5 mg via ORAL
  Filled 2022-12-22 (×4): qty 1

## 2022-12-22 MED ORDER — ONDANSETRON HCL 4 MG OR TABS
4.0000 mg | ORAL_TABLET | Freq: Three times a day (TID) | ORAL | Status: DC | PRN
Start: 2022-12-22 — End: 2022-12-23

## 2022-12-22 MED ORDER — FENTANYL CITRATE (PF) 100 MCG/2ML IJ SOLN
25.0000 ug | INTRAMUSCULAR | Status: DC | PRN
Start: 2022-12-22 — End: 2022-12-22
  Administered 2022-12-22 (×2): 25 ug via INTRAVENOUS
  Filled 2022-12-22: qty 2

## 2022-12-22 MED ORDER — OXYCODONE HCL ER 10 MG OR T12A
10.0000 mg | EXTENDED_RELEASE_TABLET | ORAL | Status: AC
Start: 2022-12-22 — End: 2022-12-22
  Administered 2022-12-22: 10 mg via ORAL
  Filled 2022-12-22: qty 1

## 2022-12-22 MED ORDER — PHENYLEPHRINE HCL-NACL 1-0.9 MG/10ML-% IV SOSY
PREFILLED_SYRINGE | INTRAVENOUS | Status: AC
Start: 2022-12-22 — End: 2022-12-22
  Filled 2022-12-22: qty 10

## 2022-12-22 MED ORDER — LEFLUNOMIDE 20 MG OR TABS
20.0000 mg | ORAL_TABLET | Freq: Every evening | ORAL | Status: DC
Start: 2022-12-22 — End: 2022-12-23
  Administered 2022-12-22: 20 mg via ORAL
  Filled 2022-12-22 (×2): qty 1

## 2022-12-22 MED ORDER — POTASSIUM CHLORIDE IN NACL 20-0.9 MEQ/L-% IV SOLN
100.0000 mL/h | INTRAVENOUS | Status: DC
Start: 2022-12-22 — End: 2022-12-23
  Administered 2022-12-22 – 2022-12-23 (×2): 100 mL/h via INTRAVENOUS

## 2022-12-22 MED ORDER — DEXAMETHASONE SODIUM PHOSPHATE 4 MG/ML IJ SOLN
INTRAMUSCULAR | Status: AC
Start: 2022-12-22 — End: 2022-12-22
  Filled 2022-12-22: qty 1

## 2022-12-22 MED ORDER — LACTATED RINGERS IV SOLN
10.0000 mL/h | INTRAVENOUS | Status: DC
Start: 2022-12-22 — End: 2022-12-22
  Administered 2022-12-22: 10 mL/h via INTRAVENOUS

## 2022-12-22 MED ORDER — OXYCODONE HCL 5 MG OR TABS
5.0000 mg | ORAL_TABLET | ORAL | Status: DC | PRN
Start: 2022-12-22 — End: 2022-12-22

## 2022-12-22 MED ORDER — ONDANSETRON HCL 4 MG/2ML IJ SOLN
4.0000 mg | Freq: Three times a day (TID) | INTRAMUSCULAR | Status: DC | PRN
Start: 2022-12-22 — End: 2022-12-23

## 2022-12-22 MED ORDER — PROPOFOL 10 MG/ML IV EMUL WRAPPER (OSM ONLY)
INTRAVENOUS | Status: DC | PRN
Start: 2022-12-22 — End: 2022-12-22
  Administered 2022-12-22: 50 ug/kg/min via INTRAVENOUS

## 2022-12-22 MED ORDER — PHENYLEPHRINE HCL (PRESSORS) 10 MG/ML IV SOLN
INTRAVENOUS | Status: AC
Start: 2022-12-22 — End: 2022-12-22
  Filled 2022-12-22: qty 1

## 2022-12-22 MED ORDER — LABETALOL HCL 5 MG/ML IV SOLN
2.5000 mg | INTRAVENOUS | Status: DC | PRN
Start: 2022-12-22 — End: 2022-12-22

## 2022-12-22 MED ORDER — MIDAZOLAM HCL (PF) 1 MG/ML IJ SOLN WRAPPER (ANESTHESIA OSM ONLY)
INTRAMUSCULAR | Status: DC | PRN
Start: 2022-12-22 — End: 2022-12-22
  Administered 2022-12-22: 2 mg via INTRAVENOUS

## 2022-12-22 MED ORDER — DULOXETINE HCL 30 MG OR CPEP
30.0000 mg | DELAYED_RELEASE_CAPSULE | Freq: Every evening | ORAL | Status: DC
Start: 2022-12-22 — End: 2022-12-23
  Administered 2022-12-22: 30 mg via ORAL
  Filled 2022-12-22: qty 1

## 2022-12-22 MED ORDER — CALCIUM CARBONATE ANTACID 500 MG OR CHEW
1000.0000 mg | CHEWABLE_TABLET | Freq: Three times a day (TID) | ORAL | Status: DC | PRN
Start: 2022-12-22 — End: 2022-12-23

## 2022-12-22 MED ORDER — TRANEXAMIC ACID-NACL 1000-0.7 MG/100ML-% IV SOLN
1000.0000 mg | Freq: Once | INTRAVENOUS | Status: DC
Start: 2022-12-22 — End: 2022-12-22
  Filled 2022-12-22: qty 100

## 2022-12-22 MED ORDER — ACETAMINOPHEN 500 MG OR TABS
1000.0000 mg | ORAL_TABLET | ORAL | Status: DC
Start: 2022-12-22 — End: 2022-12-22
  Filled 2022-12-22: qty 2

## 2022-12-22 MED ORDER — TRANEXAMIC ACID-NACL 1000-0.7 MG/100ML-% IV SOLN
1000.0000 mg | Freq: Once | INTRAVENOUS | Status: AC
Start: 2022-12-22 — End: 2022-12-22
  Administered 2022-12-22: 1000 mg via INTRAVENOUS
  Filled 2022-12-22: qty 100

## 2022-12-22 MED ORDER — ONDANSETRON HCL 4 MG/2ML IJ SOLN
4.0000 mg | INTRAMUSCULAR | Status: DC | PRN
Start: 2022-12-22 — End: 2022-12-22

## 2022-12-22 MED ORDER — PHENYLEPHRINE HCL-NACL 25-0.9 MG/250ML-% IV SOLN
INTRAVENOUS | Status: DC | PRN
Start: 2022-12-22 — End: 2022-12-22
  Administered 2022-12-22: .2 ug/kg/min via INTRAVENOUS

## 2022-12-22 MED ORDER — KETOROLAC TROMETHAMINE 15 MG/ML IJ SOLN
15.0000 mg | Freq: Four times a day (QID) | INTRAMUSCULAR | Status: DC
Start: 2022-12-22 — End: 2022-12-23
  Administered 2022-12-22 – 2022-12-23 (×4): 15 mg via INTRAVENOUS
  Filled 2022-12-22 (×4): qty 1

## 2022-12-22 MED ORDER — BISACODYL 10 MG RE SUPP
10.0000 mg | Freq: Every day | RECTAL | Status: DC | PRN
Start: 2022-12-22 — End: 2022-12-23

## 2022-12-22 MED ORDER — SENNOSIDES 8.6 MG OR TABS
17.2000 mg | ORAL_TABLET | Freq: Two times a day (BID) | ORAL | Status: DC | PRN
Start: 2022-12-22 — End: 2022-12-23
  Administered 2022-12-23: 17.2 mg via ORAL
  Filled 2022-12-22: qty 2

## 2022-12-22 MED ORDER — ALUM & MAG HYDROXIDE-SIMETH 200-200-20 MG/5ML OR SUSP
30.0000 mL | Freq: Four times a day (QID) | ORAL | Status: DC | PRN
Start: 2022-12-22 — End: 2022-12-23

## 2022-12-22 MED ORDER — HYDROMORPHONE HCL 1 MG/ML IJ SOLN
0.2500 mg | INTRAMUSCULAR | Status: DC | PRN
Start: 2022-12-22 — End: 2022-12-22

## 2022-12-22 MED ORDER — FENTANYL CITRATE (PF) 100 MCG/2ML IJ SOLN
INTRAMUSCULAR | Status: AC
Start: 2022-12-22 — End: 2022-12-22
  Filled 2022-12-22: qty 2

## 2022-12-22 MED ORDER — APREPITANT 40 MG OR CAPS
40.0000 mg | ORAL_CAPSULE | ORAL | Status: AC
Start: 2022-12-22 — End: 2022-12-22
  Administered 2022-12-22: 40 mg via ORAL
  Filled 2022-12-22: qty 1

## 2022-12-22 SURGICAL SUPPLY — 39 items
ADHESIVE SKIN DERMABOND PRINEO 22CM (Closure Device) ×1 IMPLANT
APPLICATOR PREP CHLORAPREP 26ML HI-LITE ORANGE 2% CHG (Prep) ×2 IMPLANT
BLADE SAGITTAL DE SOUTTER RECIPROCATING DRIVE 12 X 80 X 1.27MM (Blade) ×1 IMPLANT
BRUSH FEMORAL CANAL SUCTION IRRIG STERILE LF (Other) IMPLANT
BURR 6MM RND FLUTE SOFT TOUCH (Bur) IMPLANT
CONTAINER SPEC 4OZ STERILE PNEUMATIC TUBE SAFE (Other) ×1 IMPLANT
COVER FLEX STERILE LF LIGHT HANDLE PLSTC DISP GRN 1EA/PK (Other) ×1 IMPLANT
DRAPE 3/4 PROXIMA 77INX53IN SHEET (Drape) ×1 IMPLANT
DRAPE HIP ORTHOMAX 137INX112INX89IN POUCH ABSORB REINFORCE (Drape) ×1 IMPLANT
DRAPE IOBAN 60CM X 45CM (Drape) ×1 IMPLANT
DRAPE STERI-DRAPE 23INX17IN ADHESIVE INCISE (Drape) ×1 IMPLANT
DRESSING 2 3/4INX2IN TRANSPARENT (Dressing) ×1 IMPLANT
DRESSING PETROLATUM CURAD XEROFORM 9INX5IN (Dressing) ×1 IMPLANT
ELECTRODE ELECTROSURGICAL 6IN COATED BLADE (Cautery) ×2 IMPLANT
ELECTRODE PLUMEPEN ULTRA 10FT 7/8IN 3/8IN PVC FREE (Cautery) ×1 IMPLANT
HEAD CERAMIC FEMORAL G7 BIOLOX DELTA MODULAR TYPE 1 0MM 32MM ×1 IMPLANT
HOOD SURGEON FLYTE STERI-SHIELD PEELAWAY FACE SHIELD (Gown) ×5 IMPLANT
LINEN PACK SURGICAL (Other) ×1 IMPLANT
LINER 50MM 32MM STD LONGEVITY TRILOGY ×1 IMPLANT
NEEDLE HYPODERMIC SAFETY 18GA 1-1/2IN MAGELLAN (Needle) ×1 IMPLANT
PACK CUST TOTAL HIP (Pack) ×1 IMPLANT
PAD ABDOM 8X7.5IN CELLULOSE ABSORB NONWOVEN (Dressing) ×2 IMPLANT
PAD ESURG GRNDING UNIV PREATTACH CORD SPLIT (Other) ×1 IMPLANT
RETRIEVER SUTURE LASSO 10IN BLUE SAFE GRIP (Other) IMPLANT
SCREW ACETABULAR TRILOGY 6.5MM 30MM SELF TAP ×1 IMPLANT
SCREW ACETABULAR TRILOGY 6.5MM 30MM SELF TAP 00625006530 ×1 IMPLANT
SET INTERPULSE SUCTION TUBE HIGH FLOW TIP (Other) ×1 IMPLANT
SHELL ACETABULAR TRILOGY TIVANIUM 52MM PRIM ×1 IMPLANT
SPONGE GAUZE 4INX4IN STERILE 12PLY MEDLINE (Sponge) ×1 IMPLANT
STAPLER SKIN WIDE 35 COUNT STERILE LF VISISTAT (Closure Device) ×1 IMPLANT
STEM FEMORAL HIP TI TAPERLOC STD OFFSET SZ 8 8MM 101MM 133D ×1 IMPLANT
STEM FEMORAL TI TAPERLOC STD OFFSET TYPE 1 TAPER PRESS FIT 133D 8MM 101MM ×1 IMPLANT
SUTURE MAXBRAID 2 HC-5 BLUE (Suture) ×1 IMPLANT
SUTURE MONOSOF 3-0 C-14 18IN BLACK (Suture) ×1 IMPLANT
SUTURE STRATAFIX 36CM 1 CTX VIOLET (Suture) ×2 IMPLANT
SUTURE STRATAFIX SPIRAL PDO 36CM 2-0 MH VIOLET (Suture) ×1 IMPLANT
SYRINGE 60ML LF STERILE BULB TIP LID IRRIG TYVEK (Syringe) ×1 IMPLANT
SYRINGE BD LUER 20ML CONCENTRIC TIP (Syringe) ×1 IMPLANT
TUBE SUCTION KAMVAC MINI (Tubing) ×1 IMPLANT

## 2022-12-22 NOTE — Interval H&P Note (Signed)
The linked note is still valid and up to date; no changes are required.

## 2022-12-22 NOTE — Nursing Note (Signed)
ADT Time: 1400  Transported From: PACU  To:  434  Transportation Method:  stretcher  Accompanied By:  Transporter  Condition on Arrival:  stable.   SBAR Report Provided to:  RN. Ann  Location of Personal Belongings:  In pt's room  Items in [facility] or Pharmacy Returned to Patient:  NA    Comments: Pt A/Ox4. Pt rates pain 1/10. Unable to move or fell sensation on legs d't numbness. Skin checked by RN. Carmila and RN. Ann. Lt hip dressing CDI. Oriented pt room, meal, IS and fall prevention. Bed alarm on.

## 2022-12-22 NOTE — Nursing Note (Signed)
ADT Time: 57  Transported From: pacu  To: 434   Transportation Method:  stretcher  Accompanied By: staff   Condition on Arrival: stable   SBAR Report Provided to: Nancy Fetter, ADT RN  Location of Personal Belongings: [id number]   List Completed:    Items in [facility] or Pharmacy Returned to Patient:    Comments: AAOX4, VSS, 2L O2, L hip dressing intact-gauze. Recent neck surgery/injury-wearing collar. Oriented to room and instructed on IS. Denies pain/nausea. Spouse at bedside.

## 2022-12-22 NOTE — Nursing Note (Signed)
Pt spinal in effect even after several hours, just starting to be able to wiggle feet but otherwise still numb. Placed Purewick per pt request as she had been incontinent in the bed d/t decreased sensation, she stated she is unable to use a bedpan d/t her body shape. Plan therapy tomorrow. Denies pain. VSS

## 2022-12-22 NOTE — Interval H&P Note (Signed)
The linked note is still valid and up to date; no changes are required.     Niraj Kudrna, MD  FRCSC  Hip and Knee Arthritis  Professor  Department of Orthopaedics and Sports Medicine  [place] [facility] [place]

## 2022-12-22 NOTE — Anesthesia Procedure Notes (Signed)
Neuraxial Procedure    Block Type:  Procedure: spinal.  Level: L3-L4    Block Indication/Location:   Patient location during procedure: OR/Procedural area  Indication: primary anesthetic    Staff:  Performing provider: Sheral Flow, CRNA  Authorizing provider: Alma Downs, DO    Consent:  Risks discussed: block failure or incomplete block, motor weakness, nerve injury, bleeding/hematoma and infection  Obtained by: Sheral Flow, CRNA  Obtained from: patient    Neuraxial Checklist:  Patient identifed with two distinct identifiers, Procedural consent reviewed, Block consent confirmed, Allergies reviewed, Anticoagulation status reviewed, Procedural consent reviewed, Maximum local anesthetic dose calculated, Emergency drugs available, Medication syringes labeled and Monitoring in place.    Preparation:  Position: sitting  Patient sedation: mild sedation  Monitors: NIBP  Skin Prep: chlorhexidine  Sterile Barriers: sterile gloves  Skin local anesthetic (LA) used: injected    Ultrasound guided: No    Spinal needle:  Needle type: [place]  Needle gauge: 25 G  Needle length: 90 mm  Introducer used: yes  Approach: midline  Guidance: landmark  Paresthesia: No  Aspiration: CSF    Date and time placed:   12/22/2022 11:33 AM

## 2022-12-22 NOTE — Anesthesia Postprocedure Evaluation (Signed)
Patient: Autumn Camacho    Procedure Summary:   Date: 12/22/2022  Room/Location: [place] MAIN OR 06 / [place] MAIN OR    Anesthesia Start: 11:40 AM  Anesthesia Stop:  1:07 PM    Procedure(s):  LEFT TOTAL HIP ARTHROPLASTY, COMPLEX  Post-op Diagnosis     * Rheu arthritis w rheu factor of left hip w/o org/sys involv [facility]) [id number]    Responsible Provider:   Alma Downs, DO  ASA Status: 2     Vitals Value Taken Time   BP 115/93 12/22/22 1305   Temp  12/22/22 1307   Pulse 101 12/22/22 1307   SpO2 95 % 12/22/22 1307   Vitals shown include unfiled device data.    Place of evaluation: PACU    Patient participation: patient participated    Level of consciousness: sedated and responsive to voice    Patient pain control satisfaction: patient is satisfied with level of pain control    Airway patency: patent    Cardiovascular status during assessment: stable    Respiratory status during assessment: breathing comfortably    Anesthetic complications: no    Intravascular volume status assessment: euvolemic    Nausea / vomiting: patient is not experiencing nausea      Planned post-operative disposition at time of assessment: ward care

## 2022-12-22 NOTE — Progress Notes (Signed)
Progress Note     Autumn Camacho") - DOB: Oct 04, 1966 (57 year old female)  Gender Identity: Female  Pronouns: she/her/hers, patient's name  Admit Date: 12/22/2022  Code Status: Full Code       CHIEF CONCERN / IDENTIFICATION:     Autumn Camacho is a 57 year old female with rheumatoid arthritis, admitted after elective left total hip arthroplasty     SUBJECTIVE   INTERVAL HISTORY:  Patient underwent a successful left total hip arthroplasty earlier today.  At the time of my visit, she still reports significant numbness and nerve block in her legs.  She is not having any pain whatsoever.  No issues with postoperative nausea or vomiting.  No chest pain or shortness of breath.    SCHEDULED MEDICATIONS:     acetaminophen, 1,000 mg, TID    [START ON 12/23/2022] aspirin, 81 mg, BID    ceFAZolin, 2 g, q8h    DULoxetine, 30 mg, q PM    ketorolac, 15 mg, q6h SCH    leflunomide, 20 mg, q PM    polyethylene glycol 3350, 17 g, Daily    [START ON 12/23/2022] valACYclovir, 1,000 mg, q AM    INFUSED MEDICATIONS:    lactated ringers, 10 mL/hr, Continuous, Last Rate: 10 mL/hr (12/22/22 1140)    lactated ringers, 100 mL/hr, Continuous    sodium chloride 0.9% with KCl 20 mEq/L, 100 mL/hr, Continuous, Last Rate: 100 mL/hr (12/22/22 1548)     PRN MEDICATIONS:    aluminum & magnesium hydroxide-simethicone, 30 mL, QID PRN    bisacodyl, 10 mg, Daily PRN    bisacodyl, 10 mg, Daily PRN    calcium carbonate, 1,000 mg, TID PRN    melatonin, 3 mg, q HS PRN    morphine, 1-2 mg, Q2 Hours PRN    ondansetron, 4 mg, q8h PRN    ondansetron, 4 mg, q8h PRN    oxyCODONE, 5-10 mg, q4h PRN    senna, 17.2 mg, BID PRN    traMADol, 50 mg, q6h PRN       OBJECTIVE        T: 36.5 C (12/22/22 1500)  BP: 107/68 (12/22/22 1500)  HR: 96 (12/22/22 1500)  RR: 18 (12/22/22 1500)  SpO2: 99 % (12/22/22 1500) Nasal cannula 2 L/min    T range: Temp  Min: 36 C  Max: 36.5 C  Admit weight: 79.5 kg (175 lb 4.3 oz) (12/22/22 0931)  Last weight: 79.5 kg (175  lb 4.3 oz) (12/22/22 0931)       I&Os:   Intake/Output Summary (Last 24 hours) at 12/22/2022 1601  Last data filed at 12/22/2022 1349  Intake 800 ml   Output 100 ml   Net 700 ml       Physical Exam  General:  Comfortable appearing, breathing easily, no acute distress.  Eyes:  Anicteric, no conjunctival injection.  ENT:  Normocephalic, atraumatic.  Chest:  Equal breath sounds bilaterally, no wheezes or crackles.  CV:  Regular rate and rhythm, no murmurs.  Abdomen:  Soft, non distended, no masses, non tender.  Extremities:  No erythema of large or small joints;  no lower extremity edema.  Left lateral hip region with a large bandage in place, no bleeding through the bandage.  Neuro:  Awake/alert, follows commands, answers questions appropriately.  Fluent speech.  Moving all 4 extremities without difficulty.  Psych:  Calm and cooperative.  Skin:  No rashes or ulcers noted.    Labs (last 24 hours):  Chemistries  CBC  LFT  Gases, other   - - - -   -   AST: - ALT: -  -/-/-/-  -/-/-/-   - - -   - >< -  AP: - T bili: -  Lact (a): - Lact (v): -   eGFR: - Ca: -   -   Prot: - Alb: -  Trop I: - D-dimer: -   Mg: - PO4: -  ANC: -     BNP: - Anti-Xa: -     ALC: -    INR: -           Reviewed Results? Independently visualized & interpreted? Key Findings     Lab '[x]'$  '[x]'$  Fingerstick glucose normal.  COVID test negative.   Radiology '[x]'$  '[x]'$  Postop x-rays of the left hip show expected postsurgical appearance of the left total hip arthroplasty.   EKG/Tele/Echo  '[]'$  '[]'$     Other?  '[]'$  '[]'$             ASSESSMENT/PLAN      Rheumatoid arthritis  Osteoarthritis  S/p Left total hip arthroplasty  Patient has longstanding rheumatoid arthritis with prior history of right knee arthroplasty, left knee arthroplasty status post multiple revisions, and right hip arthroplasty.  She has now undergone a left total hip arthroplasty.  Postoperative course so far is uneventful.  --Postop management per orthopedic surgery  -- Aspirin 81 mg twice daily for DVT  prophylaxis  --Scheduled Tylenol, scheduled IV Toradol, and as needed IV morphine for pain control  --PT/OT  -- Continue her usual leflunomide for treatment of rheumatoid arthritis    History of recurrent genital HSV  -- Continue her usual valacyclovir    Cervical spine stenosis  Patient has had a prior anterior cervical discectomy and fusion, now complicated by hardware loosening and T1 vertebral body fracture, anticipating revision surgery in the near future.  -- Continue soft cervical collar per her home routine    Lumbar spinal stenosis  -- Continue duloxetine         Inpatient Checklist:    Fluids/Electrolytes/Nutrition: Regular diet  Prophylaxis: Aspirin 81 mg twice daily  Lines/Drains/Airways: PIV  Disposition: Likely dc home once she passes PT.   Code Status: Full Code    Contacts: Primary Emergency Contact: Camacho,Autumn, Home Phone: [phone]

## 2022-12-22 NOTE — Op Note (Signed)
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  EBL: 200 mL   Specimens: None   Wound Class: Procedure(s):  LEFT TOTAL HIP ARTHROPLASTY, COMPLEX - Wound Class: Class I/ Clean   Drains:* None in log *    Implants:  Implant Name Type Inv. Item Serial No. Manufacturer Lot No. LRB No. Used Action   SHELL Antony Odea 52MM PRIM - [id number]  SHELL ACETABULAR TRILOGY TIVANIUM 52MM PRIM  [employer] [place] [employer] ZIMMER BIOMET [id number] Left 1 Implanted   SCREW ACETABULAR TRILOGY 6.5MM 30MM SELF TAP [id number] - [id number]  SCREW ACETABULAR TRILOGY 6.5MM 30MM SELF TAP [id number]  [employer] [place] [employer] ZIMMER BIOMET [id number] Left 1 Implanted   LINER 50MM 32MM STD LONGEVITY TRILOGY - [id number]  LINER 50MM 32MM STD LONGEVITY TRILOGY  [employer] [place] [employer] ZIMMER BIOMET [id number] Left 1 Implanted   FEMORAL POROUS COATED STEM    [employer] [place] [employer] ZIMMER BIOMET [id number] Left 1 Implanted   BIOLOX MODULAR CERAMIC HEAD    [employer] [place] [employer] D[employer] ZIMMER BIOMET [id number] Left 1 Implanted        Indication(s): Rozalynn Enslow is an 57 year old female with severe rheumatoid arthritis, affecting multiple joints, with severe left hip arthritis.      OPERATION  Left total hip arthroplasty, cementless.       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       ASSISTANTS  Clarisa Kindred, MD  Crista Elliot, PA-C            ANESTHESIA  Spinal.       ESTIMATED BLOOD LOSS  200 cc.       IV FLUIDS  1000 cc crystalloid.            DRAINS PLACED  None.       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  At the conclusion of the procedure, we had achieved a stable left hip replacement with no subluxation or dislocation in any of the following positions.  1.     90 degrees external rotation with the hip fully extended.  2.     Full hip flexion, abduction and external rotation.  3.     90 degrees of flexion with 70 degrees or internal rotation and 20 degrees of adduction across the midline.  4.     Midranges of flexion with full  internal and external rotation.      Soft-tissue tension was appropriate, in that we could easily extend the hip fully and flex the knee fully without springiness. Leg length was estimated to have been 1cm short based on pre-op templating and intra-operative measurements.       COMPONENTS IMPLANTED  Zimmer Trilogy size 52 acetabular shell with a 32 mm inner diameter Longevity crosslinked polyethylene liner and 1 supplemental bone screw placed posterosuperiorly.    On the femur we used a  Medical illustrator microplasty size 8 with a 0 x 32 mm ceramic femoral head.       DISPOSITION  There were no complications.  The patient tolerated the procedure well and went to recovery in good condition.       INDICATIONS  The patient is a 57 year old female with severe left hip arthritis and activity-limiting pain, who presented for total hip replacement.  The risks of this procedure and the alternatives to this procedure were reviewed at some length in advance.  We emphasized those  areas where we perceived the risk in this patient's case to be more severe than the typical patient undergoing this operation.  The patient nonetheless desired to proceed.       OPERATIVE TECHNIQUE  The patient was identified, taken to the operating room, and anesthetized.  The patient was then positioned in the lateral decubitus position with the operative hip up.  Because of potential cervical spine instability, we used a rigid cervical collar at the direction of neurosurgery, and kept this in place throughout the procedure.  All bony prominences and subcutaneous nerves were well padded and an axillary roll was used.  The hip and lower extremity were prepped and draped sterilely.  I then created a direct lateral skin incision and a modified direct lateral (anterolateral/modified Hardinge) approach.  We dissected down to the fascia, incised the fascia in line with the skin incision and then created a flap consisting of the anterior 30% of the gluteus medius elevated in continuity with the anterior 30% of the vastus lateralis.  The gluteus minimus was elevated in the same direction and I excised the anterior hip capsule.  Prior to dislocation, I performed a stab wound directly over the iliac crest, and inserted a 7/64ths fully threaded Steinmann pin into the iliac crest.  I then placed the Harris leg length caliper, and marked multiple sites on the femur for measurement, recording each one.  I dislocated the hip anteriorly and osteotomized the femoral neck at the level preoperatively templated.  We brought the leg back on the table and placed two self-retainers, exposing the acetabulum.  These were placed under direct visualization.  No posterior soft tissue retractors were used.  I placed an anteroinferior retractor directly against bone.  I excised the acetabular labrum and incised the inferior hip capsule and transverse ligament.  We prepared the acetabulum using motorized reamers, orienting them  in 40 to 45  degrees cup abduction, 15 to 20 degrees cup anteversion.  The final reamer, which filled the cup nicely was size 50, so we irrigated and dried the acetabulum and impacted the Zimmer Trilogy acetabular shell size 52 in 40 to 45 degrees cup abduction, 15 to 20 degrees cup anteversion. It had excellent press fit.  We augmented this with one posterosuperior bone screw placed using standard technique.  I washed and dried the socket and impacted the polyethylene liner, verifying that the locking mechanism engaged.  We then turned our attention to the  femur.  We created a channel within the metaphysis using a canal finder and box osteotome.  Next, we prepared the femur using handheld broaches in 15 degrees of anteversion and estimated neutral varus/valgus alignment.  The final broach had excellent press fit at the desired level.  Trial reductions revealed the stability, soft-tissue tension and leg length described above.  We removed the broach, and impacted the Zimmer size 8 Taperloc microplasty stem in 15 degrees of anteversion.  It had excellent press fit at the desired level.  I washed and dried the trunion and impacted the 32 mm x 0 neck length ceramic femoral head.    We washed and dried the socket, reduced the joint, verified limb length, soft tissue tension, and joint stability one final time.  We obtained final hemostasis and copiously irrigated the surgical field.  We closed the abductor sleeve with #1 Vicryl.  We then closed the wound in layers in routine fashion with a running barbed suture.  No drain was used because hemostasis was excellent.  We irrigated copiously with pulsatile lavage between each layer of our closure.  We placed a dressing and brought the patient to the recovery room.  There were no intraoperative complications.       POSTOPERATIVE PLAN  Weightbearing as tolerated.  Anterolateral hip protocol, with appropriate dislocation precautions.  Thromboprophylaxis will be with ASA, sequential  compression devices, and TED hose in accordance with the AAOS practice guideline on prevention of pulmonary embolism.  Analgesia will be with oral and rgional agents.  Cefazolin 2gm iv was given between 10 and 60 minutes prior to skin incision. Antibiotics will continue postoperatively for 24 hours, at which point they will be discontinued.

## 2022-12-23 ENCOUNTER — Other Ambulatory Visit (HOSPITAL_BASED_OUTPATIENT_CLINIC_OR_DEPARTMENT_OTHER): Payer: Self-pay

## 2022-12-23 LAB — CBC (HEMOGRAM)
Hematocrit: 37 % (ref 36.0–45.0)
Hemoglobin: 11.9 g/dL (ref 11.5–15.5)
MCH: 29.8 pg (ref 27.3–33.6)
MCHC: 31.9 g/dL — ABNORMAL LOW (ref 32.2–36.5)
MCV: 93 fL (ref 81–98)
Platelet Count: 235 10*3/uL (ref 150–400)
RBC: 4 10*6/uL (ref 3.80–5.00)
RDW-CV: 13.4 % (ref 11.0–14.5)
WBC: 11.33 10*3/uL — ABNORMAL HIGH (ref 4.3–10.0)

## 2022-12-23 LAB — BASIC METABOLIC PANEL
Anion Gap: 9 (ref 4–12)
Calcium: 8.1 mg/dL — ABNORMAL LOW (ref 8.9–10.2)
Carbon Dioxide, Total: 22 meq/L (ref 22–32)
Chloride: 106 meq/L (ref 98–108)
Creatinine: 0.49 mg/dL (ref 0.38–1.02)
Glucose: 126 mg/dL — ABNORMAL HIGH (ref 62–125)
Potassium: 4.1 meq/L (ref 3.6–5.2)
Sodium: 137 meq/L (ref 135–145)
Urea Nitrogen: 11 mg/dL (ref 8–21)
eGFR by CKD-EPI 2021: >60 mL/min/{1.73_m2} (ref >59)

## 2022-12-23 MED ORDER — SENNOSIDES 8.6 MG OR TABS
17.2000 mg | ORAL_TABLET | Freq: Two times a day (BID) | ORAL | 0 refills | Status: DC | PRN
Start: 2022-12-23 — End: 2023-03-19
  Filled 2022-12-23: qty 30, 8d supply, fill #0

## 2022-12-23 MED ORDER — OXYCODONE HCL 5 MG OR TABS
5.0000 mg | ORAL_TABLET | ORAL | Status: DC | PRN
Start: 2022-12-23 — End: 2022-12-23
  Administered 2022-12-23 (×2): 10 mg via ORAL
  Filled 2022-12-23: qty 2
  Filled 2022-12-23 (×2): qty 1

## 2022-12-23 MED ORDER — ASPIRIN 81 MG OR TBEC
81.0000 mg | DELAYED_RELEASE_TABLET | Freq: Two times a day (BID) | ORAL | 0 refills | Status: DC
Start: 2022-12-23 — End: 2023-03-03
  Filled 2022-12-23: qty 84, 42d supply, fill #0

## 2022-12-23 MED ORDER — ACETAMINOPHEN 500 MG OR TABS
1000.0000 mg | ORAL_TABLET | Freq: Three times a day (TID) | ORAL | 0 refills | Status: DC
Start: 2022-12-23 — End: 2023-03-19
  Filled 2022-12-23: qty 90, 15d supply, fill #0

## 2022-12-23 MED ORDER — OXYCODONE HCL 5 MG OR TABS
5.0000 mg | ORAL_TABLET | ORAL | 0 refills | Status: DC | PRN
Start: 2022-12-23 — End: 2022-12-28
  Filled 2022-12-23: qty 42, 3d supply, fill #0

## 2022-12-23 MED ORDER — ONDANSETRON HCL 4 MG OR TABS
4.0000 mg | ORAL_TABLET | Freq: Three times a day (TID) | ORAL | 0 refills | Status: DC | PRN
Start: 2022-12-23 — End: 2023-03-03
  Filled 2022-12-23: qty 20, 7d supply, fill #0

## 2022-12-23 NOTE — Progress Notes (Signed)
Physical Therapy Treatment    Autumn Camacho  [id number]     12/23/2022  Duration of session: 23 Minutes    PT Diagnosis: Rh arthritis with Rheu factor of L hip  PT Considerations: s/p L THA  and remains WBAT LLE with precautions: no active abd and no hip flex greater than 120 degrees.     Subjective  Patient Report/Self-Assessment: She was willing to participate in PT   Patient Stated Goal(s): to amb on level surfaces     Home Living  Type of Home: Multilevel home  Entry Stairs: 8 steps with HR  Lives With: Spouse  Assistance Available at Home : 24 hour assistance  Mobility Equipment Owned: [place] wheeled walker;Single point cane    Functional Level:  Bed Mobility  Supine to Sit: Independent  Sit to Supine: Supervision / Stand by assistance;Other (she remains not remembering her hip abd precaution. PT emphasis this precaution when getting into the bed and getting in/out of the car)      Transfers  Sit<>Stand: Independent  Stand <> Sit: Independent  Bed<>Chair/WC: Independent  Chair/WC<>Bed: Independent  Bed<>Chair/WC Assistive Device Details: Front wheeled Financial trader  Distance: 120'  Assistance Needs: Supervision / Stand by assistance  Assistive Device Used: Front wheeled walker  Gait quality/descriptors: Decreased pace;Decreased step length (PT instructed her to amb more slowly to allow her hip to heal.)      Stairs  Number of steps: 8  Style: Step to  Assistance Needs: Supervision / Stand by assistance  Assistive Device Used: 1 railing;Single point cane             Interventions:  Therex: PT instructed this patient in her exs for her new hip                                    Theract: bed mobility       transfers       amb on level surfaces       ascends/descend 8 steps using the SPC and the HR            Balance/Neuro Re-ed:                                       Gait Training:                                                   ASSESSMENT/PLAN     [name] was willing to participate in PT. Bed  mobility was performed with using the HR; she has a hand-hold object at home that she uses for bed mobility. Sit to stand transfers were performed on her own to the [place]. She amb to the steps, and ascends/descends 8 steps using the HR and the SPC. PT also recommends HHPT for home.      Barriers to Discharge: she reports being confident on the steps.           PT Discharge Equipment: FWW         Goals:  Goal: Bed mobility, Independent  Estimated Completion Date: 12/23/22  Goal Status: Progressing   Goal: Transfer with AD, Independent  Estimated Completion Date: 12/23/22  Goal Status: Progressing   Goal: Amb with AD, Independent  Estimated Completion Date: 12/23/22  Goal Status: Progressing  Goal: steps-8  Estimated Completion Date: 12/23/22  Goal Status: Progressing             PT Plan of Care:  Treatment/Interventions: Therapeutic exercise, Therapeutic functional activities, Gait/Stairs training, Neuromuscular re-education     PT Plan: Skilled PT     PT Dosage: 1-2x/day, 5-6 days/week     Plan for Next Session: she has met the PT goals      Other Recommendations:  PT Discharge Recommendations: Follow up with PT at next level of care (to be determined by primary team)        Hulan Fess, PT

## 2022-12-23 NOTE — Discharge Instructions (Addendum)
Dr. Derek Mound Discharge Instructions following Hip Replacement    Blood Clot Prevention  Continue to take Aspirin 81 mg twice daily for the next 6 weeks to prevent blood clots. Take aspirin with food to prevent stomach upset. If you take aspirin for other reasons you may resume your home dosage after 6 weeks. Wear your compressions stocking at all times (except when showering) for the next four weeks. Signs of a blood clot can include throbbing or cramping pain, swelling, redness and warmth in the calf area, sudden breathlessness, or sharp chest pain. Should you have these signs, please contact the clinic or after-hours covering physician. Consider taking an over-the-counter acid reducer, such as omeprazole, if you experience any acid reflux while taking this medication, especially if you have a history of GERD.     Pain Management   You are being discharged with several medications for pain management, both narcotic and non-narcotic. Always begin with ice and elevation, and then move to medication. Please follow the guidelines below. The medications work best and are designed to be taken together.    Scheduled Medication - NON NARCOTIC:  Acetaminophen (Tylenol) 1000 mg by mouth every 8 hours. Do not exceed 3000 mg Tylenol in a 24 hour period. Tylenol potentiates narcotics and patients report improved pain relief when used in combination. Take Tylenol in a scheduled fashion for at least the first 5 days following surgery, after which you may transition to Tylenol as needed. Dr. Catalina Gravel prefers his patients avoid NSAIDs (like meloxicam) for the first 6 weeks after surgery.     As Needed Medications - NARCOTIC:  If the above regimen does not provide adequate relief, you may add the following;    Oxycodone 5-10 mg by mouth every 4 hours for severe pain. Oxycodone is a potent narcotic reserved for severe pain only. Oxycodone should be sparingly in the first days to few weeks following surgery. You should wean off  oxycodone as soon as you are able to tolerate.     Per [facility], you may now choose to partially fill a narcotic prescription. Should you choose to do so, the pharmacy will hold the remainder of your narcotic pills to dispense if you need them for 4 weeks following surgery (must be filled within 30 days of the date that the prescription was written).    *Do not drive, operate heavy machinery, drink alcohol or take illicit drugs while taking narcotics (Oxycodone).     *You will have the ability to obtain narcotic refills at your follow-up clinic visit in 2 weeks. Should you need a refill prior to that, please call the clinic [phone]. Please note, narcotic prescriptions require a 2 business day notice for refills.    Wound Care  You must remove your surgical dressing and shower / use soap on post-operative day 1. Dr. Catalina Gravel prefers his patients to gently wash the incision with soap, then pat dry. No scrubbing. The clear tape-looking dressing directly on your incision should remain in place until your first follow-up with Dr. Catalina Gravel. It is okay to get this clear tape-looking dressing wet. No soaking the incision such as in a bath or hot tub. No swimming.     It is normal for your operative leg to continue to have moderate swelling, bruising, warmth and mild redness especially around the surgical site in the post-operative period. Low grade fevers may also occur (less than 101 degrees F). These should improve with time. Do not hesitate to reach out  should you have concerns.     Constipation  Narcotics can be constipating. In addition to the Senna prescribed, you may add over the counter Miralax or Docusate as needed should constipation be a problem. Be sure to drink plenty of water while taking narcotics. Please notify your provider if you have not had a bowel movement within 5 days. You may also consider a rectal suppository or Fleet's enema.     Ice  Continue to use ice for the next several days after  discharge. We recommend filling 3 or 4 gallon size ziplock bag with 90% water and 10% rubbing alcohol. Place them in the freezer and rotate use. In this solution, it will freeze into a slushy consistency, allowing easier use against the incision. Apply the ice three to four times daily for 20-30 minutes at a time.     Call the clinic for any concern of wound redness, increasing pain or drainage.     Nausea  You are being discharged with an anti-nausea medication, Ondansetron (Zofran). You may take this as needed for nausea. Keep in mind that constipation can sometimes be the culprit of nausea, so also evaluate whether or not you are having regular bowel movements.     Preventing Infection  Avoid any dental work or non-urgent procedures for three months following surgery. If you have diabetes, controlling your blood sugars is important in wound healing and preventing infection. If you develop an infection in another area of your body (bladder, skin, ear, etc), be sure that your primary care provider is aware that you have an artificial joint.     You may require a prophylactic antibiotic prior to any invasive procedure (root canal,  colonoscopy, cardiac catheterization etc). If you have a question regarding whether or not an antibiotic is needed, please call Dr. Derek Mound office.     Activity  Continue to adhere to your anterolateral precautions taught to you by Physical Therapy. Avoid active abduction and hip flexion greater than 120 degrees. You may begin outpatient physical therapy within a few days of discharge. We recommend two to three physical therapy sessions weekly, as you tolerate. Your new joint is designed for average daily activities, not high impart sports. Walking, swimming and cycling are recommended once cleared by Dr. Catalina Gravel. Aggressive sports such as jogging, running or jumping may impair or compromise the function and long term success of your new joint. You may resume sexual activity as soon as it  is comfortable for you, as long as you follow your hip dislocation precautions after surgery.     Driving  You may not be ready to resume driving for a few weeks. If you had surgery on your right leg, you should not drive for at least one month. If your surgery was on your left leg, you may return to driving as you feel comfortable, providing that you have an automatic transmission. No driving while taking narcotics.     Return to Work  This depends on your profession. Typically, if your work is primarily sedentary you may return after 3-4 weeks. If your work is vigorous, you may require 2-3 months before you can return to full duty. Dr. Catalina Gravel can assist in determining when it's appropriate for you to return to work.    Indications to call Dr. Catalina Gravel  Fever greater than 101 degrees.   Changes in your incision; opening, draining, redness, tenderness  A sudden 'giving way ' of your hip with inability to bear weight  Numbness,  tingling or loss of function in your operate leg.   Increasing pain not treated with pain medications.     Monday through Friday call the clinic [phone], Option 2. After hours or on the weekends, call [phone] and request the Orthopaedic Resident Physician covering Dr. Catalina Gravel.

## 2022-12-23 NOTE — Progress Notes (Signed)
Occupational Therapy Evaluation   Medicare Certification  Plan of care start date: 12/23/22, end date: 03/23/23   Date of onset: 12/21/21    Medicare and other payers require an attending physician to certify the need for Rehab Therapy Services.  Your signature indicates you have reviewed and approved the Plan of Care (POC). To communicate an update or change in this plan, please add your comments below AND contact the therapist.    Patient name: MRN: DOB:   Autumn Camacho [id number] 1966/03/12        Patient Name: Autumn Camacho  MRN: [id number]  Today's Date: 12/23/2022  Total Treatment time: 40 Minutes     OT Diagnosis: impaired ADLs and mobility s/p L THA (WBAT, no active abd, no flex>120)       Communication Needs       Home Living  Type of Home: Multilevel home (split)  Entry Stairs: 8 steps with HR  Lives With: Spouse;Daughter  Assistance Available at Home : 24 hour assistance  Bathroom Shower/Tub: Tourist information centre manager: Standard (chair height)  Mobility Equipment Owned: Ridge walker;Single point Firefighter Owned: Built-in shower seat;Grab bars in shower;Grab bars around toilet (leg lifter, sock aid, reacher)     Prior Function  BADLs: Requires assist (LB dressing assist, assist for LB/back bathing, ind with toilet)  IADLs: Requires assist  Functional Mobility: Independent (FWW in home, [place] for out of house prior,)     Subjective: Patient Report/Self-Assessment: Pt in bed, pleasant and agreeable to meet with OT  Patient Stated Goal: Hopeful to d/c home today      Cognition  Overall Cognitive Status: Within Functional Limits  Arousal/Alertness: Appropriate responses to stimuli  Orientation Level: Oriented X4  Following Commands: Consistently follows verbal commands  Cognition Comments: easily distracted, cues required for maintaining precautions    RUE Assessment: Within Functional Limits  LUE Assessment  LUE Assessment: Within Functional  Limits    See flowsheet data for other extremity and neurological assessment details     ADL and Functional Transfer Status reflective of last 24 hours                          Upper Body Dressing: Set up or clean up assistance       Lower Body Dressing: Minimum Assist (less than 25% help needed)    LE Dressing Comments: cues for precautions, had assist from family for dressing at baseline    Putting on/taking off footwear: Dependent (greater than 75% help needed)     Putting on/taking off footwear Comment: will have assist for dressing            Bathing: Supervision / Stand by assistance;Contact guard assist (touching/steadying assistance)   Bathing Comments: simulated step in shower transfer.     ADL Comments  Pt educated on AE recs, activity pacing, and modifications for BADLs/IADLs to reduce risk for falls and maintain ind.       Bed Mobility  Bed Mobility  Supine>Sit: Supervision / Stand by assistance  Sit>Supine: Supervision / Stand by assistance  Bed Mobility Comments: with leg lifter, does well maintaining precautions     Balance  Static Sitting Balance  Static Sitting Level of Assistance: Independent  Dynamic Sitting Balance  Dynamic Sitting Balance Level of Assist: Supervision / Stand by assistance  Dynamic Sitting Balance Comment: cues for precautions  Static Standing Balance  Static Standing Balance Level of  Assistance: Supervision / Stand by assistance  Static Standing Balance Comment: FWW  Dynamic Standing Balance  Dynamic Standing Balance Level of Assist: Supervision / Stand by assistance;Contact guard assist (touching/steadying assistance)  Dynamic Standing-Comments: FWW    Transfers  Transfers  Sit<>Stand: Supervision / Stand by assistance  Cuing Used: Verbal  Sit <> Stand Comments: FWW  Bed<>Chair/WC: Supervision / Stand by assistance  Cuing Used: Verbal  Bed<>Chair/Wheelchair Comments: FWW  Shower Transfer: Contact guard assist (touching/steadying assistance);Supervision / Stand by  Animator Transfers Comments: FWW, simulated step in shower     Functional Mobility  Functional Mobility  Distance: approx 171f with FWW, slow antalgic gait,  Assistance Needs: Supervision / Stand by assistance      TODAYS INTERVENTIONS (minutes):    Self-Care: (20 Minutes)        ASSESSMENT & PLAN     Impact on Function and Participation: LDanetteis a 57y/o F with PMH: RA, L TKA ('18, mult revisions), R THA ('21), lumbar pain, and recent cervical spinal surgery (11/23) who is now s/p L THA (WBAT, no active abd, no flex >120). Pt lives with her supportive spouse and dtr in a multilevel home (WIS with seat, CTrentontoilet) and family is able to provide assist as needed. At baseline pt's mobility has been significantly limited with use of FWW in home and transport chair for community distances. Family provides assist for IADLs,  LB dressing and bathing but pt indly manages toileting. During eval pt demos safe bed mobility, transfers (chair, shower) and short distance mobility with FWW and SBA (intermittent CGA). Pt does require Min A for LB dressing with cues for maintaining hip precautions. Pt declines trianing for LB dressing as she has used in the past .Pt educated on AE recs, activity pacing, and modifications for BADLs/IADLs to reduce risk for falls and maintain ind. Pt has met IP OT goals and anticipate a safe d/c home when medically stable and meets PT goals. D/C IP OT.     Evaluation/Treatment Tolerance: Patient tolerated treatment well   Functional Prognosis: Good    Barriers to Discharge:  medical, stairs,      Discharge Recommendations  OT Discharge Recommendations: No follow up OT indicated   Assistance with : IADLs, ADLs   Supervision with  : Home safety, Functional mobility and transfers          Equipment Comments: Pt already owns recommended equipment, OT provided with long handled sponge    Goals   Goal: Grooming, Upper body dressing, Lower body dressing, Toileting, Toilet transfer, Tub/shower  transfer, Bed mobility, Supervision / Stand by assistance, Adaptive equipment  Estimated Completion Date: 12/23/22  Goal Status: Adequate for discharge (pt will have assist from family)                     OT Plan of Care     OT Plan: No Skilled OT/Discharge OT         MTheressa Stamps OT  12/23/2022

## 2022-12-23 NOTE — Home Health Face To Face (Signed)
Date of Face to Face Encounter: 12/23/2022    This Face to Face Encounter was in whole, or in part, for the following medical condition(s) which is the primary reason for home health care:  Medical Condition(s): (M16.12) Primary osteoarthritis of left hip  (primary encounter diagnosis)    I certify that, based on my findings, the following services are medically necessary for home health services: Physical Therapy    Ancillary Services (only allowed if Nursing, PT, or ST are ordered):  OT    My clinical findings that support the need for the above services are: total hip arthroplasty    I certify that this patient is homebound (.i.e. absences from require considerable and taxing effort and are for medical reasons or religious services and are infrequent or of short duration) because: unsafe to travel distances for therapies    The [employer] (CMS) requires the hospitalist/attending signing the Face to Face Encounter documentation to identify the community physician in the discharge plan for home health care (the provider assuming ongoing management of the [place]).    Primary Care Physician: Terressa Koyanagi, MD    Attending Name: Amparo Bristol  12/23/2022 at 4:48 PM

## 2022-12-23 NOTE — Nursing Note (Signed)
Patient Summary  Patient is 57 year old female s/p LEFT TOTAL HIP ARTHROPLASTY, COMPLEX:12/22/2022.     Hx of  Chronic diarrhea, Diverticulosis, Genital herpes, Nerve pain, RA ,Spinal stenosis, and Urinary incontinence.   Edited by: Gwynneth Munson, RN at 12/22/2022 1411    Illness Severity  Stable. POD #0  Edited by: Gwynneth Munson, RN at 12/22/2022 1411    Pt able to ambulate to bathroom w/ FWW w/ just SBA,. Dressing CDI. Pain was poorly controlled around midnight but it became controlled quickly with staggering the oxycodone Q3 hrs with a second one an hour later. Pt slept well after this. Pt is a bit stressed and emotional over her multiple surgeries.

## 2022-12-23 NOTE — Nursing Note (Signed)
Pt cleared therapies, VSS, L hip dressing intact. Adequate pain management with oral medications. Discharge paperwork reviewed and signed, home with spouse.

## 2022-12-23 NOTE — Progress Notes (Signed)
Physical Therapy Evaluation    Fraida Rademacher  [id number]     12/23/2022  Duration of session: 30 Minutes  Medicare Certification  Plan of care start date: 12/23/22, end date: 03/23/23   date of onset: 12/22/22    Medicare and other payers require an attending physician to certify the need for Rehab Therapy Services.  Your signature indicates you have reviewed and approved the Plan of Care (POC). To communicate an update or change in this plan, please add your comments below AND contact the therapist.    Patient name: MRN: DOB:   Autumn Camacho [id number] 06-27-1966      Patient Active Problem List   Diagnosis    Rheumatoid arthritis involving multiple sites ([place])    Rheumatoid arthritis ([place])    Rheumatoid arthritis involving right hand ([place])    Arthritis of carpometacarpal (CMC) joints of both thumbs    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    Osteoarthritis of right hip    Osteoarthritis of right knee    Knee dislocation, left, initial encounter    Failed total left knee replacement (HCC)    Chronic instability of left knee    Status post revision of total replacement of left knee    Chronic right-sided low back pain with sciatica    Lumbar spinal stenosis    Cervical stenosis of spinal canal    Depressed mood    Rheu arthritis w rheu factor of left hip w/o org/sys involv (HCC)    Recurrent genital HSV (herpes simplex virus) infection    Class 1 obesity due to excess calories with serious comorbidity and body mass index (BMI) of 34.0 to 34.9 in adult    Closed fracture of first thoracic vertebra (HCC)    Loosening of hardware in spine ([place])    History of total right knee replacement    Diverticulosis       Past Medical History:   Diagnosis Date    Chronic right hip pain 02/26/2019    Chronic diarrhea     attributed to RA meds.    Chronic instability of knee, left knee     Diverticulosis     Moderate diverticulosis, w/out hx of diverticulitis or diverticular bleeding.    Fracture      Bilateral elbow fx    Genital herpes     Genital herpes     on chronic valacyclovir with no flares.    Motion sickness     Neck pain     d/t " neck instability , loose fusion plate & screws "    Nerve pain     on Duloxetine.    RA (rheumatoid arthritis) (HCC)     Rheumatoid arthritis (HCC)     Seasonal allergies     Spinal stenosis     Tooth disorder     MISSING TOOTH UPPER LEFT BACK-MOLAR    Urinary incontinence     post Cervical fusion "but getting better "       Past Surgical History:   Procedure Laterality Date    CERVICAL FUSION  08/21/2022    9. Emergent C7-T1 anterior cervical discectomy & fusion.    CERVICAL LAMINECTOMY  08/27/2022    C7 laminectomy 08/27/2022.    foot Bilateral     Arch surgery    HIP ARTHROPLASTY Right 07/30/2020    HIP ARTHROPLASTY Left 12/22/2022    KNEE ARTHROPLASTY Left 2018    KNEE ARTHROPLASTY Right 2018  KNEE TOTAL ARTHROPLASTY REVISION Left 05/01/2020    KNEE TOTAL ARTHROPLASTY REVISION Left 04/2018    04/2018 & 04/2020.    POSTERIOR CERVICAL LAMINECTOMY  08/27/2022    correction : C7 laminectomy and inferior C6 laminotomy @ [facility]    PR ADENOIDECTOMY PRIMARY <AGE 60      AGE 52  T/A     PR TONSILLECTOMY ONE-HALF <AGE 57      PR UNLISTED PROCEDURE FOOT/TOES Bilateral 2009 R,  2010 L    Reconstructive surgery    PR UNLISTED PROCEDURE PELVIS/HIP JOINT      DOS 07/30/20 R THA       PT Diagnosis: Rh arthritis with Rheu factor of L hip  PT Considerations: s/p L THA  and remains WBAT LLE with precautions: no active abd and no hip flex greater than 120 degrees.     Subjective  Patient Report/Self-Assessment: She was willing to participate in PT   Patient Stated Goal(s): to amb on level surfaces     Home Living  Type of Home: Multilevel home  Entry Stairs: 8 steps with HR  Lives With: Spouse  Assistance Available at Home : 24 hour assistance  Mobility Equipment Owned: Front wheeled walker;Single point cane               Objective  RUE Assessment  RUE Assessment: Exceptions to [facility]  RUE  Assessment Comments: RA in fingers                        LUE Assessment  LUE Assessment: Exceptions to [facility]  LUE Assessment Comments: RA in fingers                        RLE Assessment  RLE Assessment: Within Functional Limits                        LLE Assessment  LLE Assessment: Exceptions to [facility]  LLE Assessment Comments: LLE remains at a 3-/5                                             Static Sitting Balance   Static Sitting Balance Level of Assistance: Supervision / Stand by assistance    Dynamic Sitting Balance  Dynamic Sitting Balance Level of Assistance: Supervision / Stand by assistance    Static Standing Balance  Static Standing Balance Comments: FWW  Static Standing Balance Level of Assistance: Supervision / Stand by assistance  Dynamic Standing Balance  Dynamic Standing Balance Comments: FWW  Dynamic Standing Balance Level of Assistance: Supervision / Stand by assistance    Activity Tolerance  Endurance: Tolerates 30 min of activity with multiple rests      Functional Level:  Bed Mobility  Supine to Sit: Supervision / Stand by assistance  Sit to Supine: Minimum Assist (less than 25% help needed);Other (scooping the leg with the other leg to place on the bed surface)      Transfers  Sit<>Stand: Supervision / Stand by assistance  Stand <> Sit: Supervision / Stand by assistance  Bed<>Chair/WC: Supervision / Stand by assistance  Chair/WC<>Bed: Supervision / Stand by assistance  Bed<>Chair/WC Assistive Device Details: Front wheeled walker       Ambulation  Distance: 100'  Assistance Needs: Supervision / Stand by assistance  Assistive Device Used:  Front wheeled walker  Gait quality/descriptors: Decreased pace;Decreased step length;Decreased weight shift     Stairs  Number of steps: 0             Interventions:  Therex: PT instructed this patient in her exs for her new hip                                    Theract: bed mobility       transfers       amb on level surfaces       monitored her sxs.             Balance/Neuro Re-ed:                                       Gait Training:                                                   ASSESSMENT/PLAN     Erick was willing to participate in PT. Bed mobility was performed with sba of 1; and returning to the bed required min assist with her other leg to place it on the bed. Sit to stand transfers were performed with sba of 1 to the [place]. She amb 100' on level surfaces remaining stable and returned to the room for her exs. She will need to amb longer distances with the FWW, to perform the exs with better fluidity, and perform on the steps. These will be progressing in the afternoon.      Barriers to Discharge: steps           PT Discharge Equipment: FWW         Goals:  Goal: Bed mobility, Independent  Estimated Completion Date: 12/23/22  Goal Status: Progressing   Goal: Transfer with AD, Independent  Estimated Completion Date: 12/23/22  Goal Status: Progressing   Goal: Amb with AD, Independent  Estimated Completion Date: 12/23/22  Goal Status: Progressing  Goal: steps-8  Estimated Completion Date: 12/23/22  Goal Status: Progressing       PT Plan of Care:  Treatment/Interventions: Therapeutic exercise, Therapeutic functional activities, Gait/Stairs training, Neuromuscular re-education     PT Plan: Skilled PT     PT Dosage: 1-2x/day, 5-6 days/week     Plan for Next Session: steps, exs and amb on level surfaces      Other Recommendations:  PT Discharge Recommendations: Follow up with PT at next level of care (to be determined by primary team)        Hulan Fess, PT

## 2022-12-24 ENCOUNTER — Telehealth (INDEPENDENT_AMBULATORY_CARE_PROVIDER_SITE_OTHER): Payer: Self-pay | Admitting: Orthopaedic Surgery

## 2022-12-24 NOTE — Progress Notes (Signed)
Social Work & Care Coordination Progress Note    ASSESSMENT:  Admit reason: This is a 57 year old  female admitted for Rheu arthritis w rheu factor of left hip w/o org/sys involv ([place]).      Funding:   Currently Photographer Member ID    [place] [id number]    [place] [id number]    PHARMACY BENEFIT MANAGER [employer] ANACAREN, LEWI [id number]    PHARMACY BENEFIT MANAGER [employer] D RX LINZEY, LYKINS [id number]           Primary Decision Maker: Patient is their own decision maker    Outpatient Care Team: Patient Care Team:  Terressa Koyanagi, MD as PCP - General (Family Practice)  Gweneth Dimitri, PharmD as Pharmacist (Pharmacy)       Plan or outcome:      Message received from NP that patient would benefit from home health services. Patient  provided freedom of choice.  [facility] health elected and sent [facility] referral.      Malva Limes, RN

## 2022-12-24 NOTE — Telephone Encounter (Signed)
Pt had surgery with Dr. Catalina Gravel on 12/22/22, exited hospital yesterday, had staples instead of other type of closure and the occupational therapist wants to confirm what the recommendations are in terms of showering/taking the bandage off/etc.    Routing to clinical support for assistance.

## 2022-12-24 NOTE — Telephone Encounter (Signed)
ON DISCHARGE NOTES:

## 2022-12-24 NOTE — Telephone Encounter (Signed)
If dry for 5 days then ok to remove and start washing gently with soap and water, ie shower.    Thanks,    Norina Buzzard, PA-C  Teaching Associate  Dept of Orthopaedics and Sports Medicine  Joint Replacement/Hip and Knee Arthritis  [place] [facility]

## 2022-12-24 NOTE — Telephone Encounter (Signed)
Spoke with the patient and relayed Arther Dames instructions. Patient stated understanding and appreciated the call.

## 2022-12-28 ENCOUNTER — Telehealth (INDEPENDENT_AMBULATORY_CARE_PROVIDER_SITE_OTHER): Payer: Self-pay | Admitting: Orthopaedic Surgery

## 2022-12-28 DIAGNOSIS — M1612 Unilateral primary osteoarthritis, left hip: Secondary | ICD-10-CM

## 2022-12-28 NOTE — Telephone Encounter (Signed)
Medication: oxyCodone    Date of Surgery: 12/22/22    Pharmacy:     [facility] [id number] [phone] [place] [address]  Phone: [phone] Fax: [phone]    Prescribing Provider: Dr. Catalina Gravel / Sophia Gavin Pound ARNP    How many pills do they have left? 21 tablets    How often are you taking the medication? 10 a day, 2 every 5 hours    Routing to Clinical Support Staff to assist.

## 2022-12-29 MED ORDER — OXYCODONE HCL 5 MG OR TABS
5.0000 mg | ORAL_TABLET | ORAL | 0 refills | Status: DC | PRN
Start: 2022-12-29 — End: 2023-01-01

## 2022-12-29 NOTE — Telephone Encounter (Signed)
Spoke with patient and informed that requested medication/s has been approved and sent to preferred pharmacy. Advised patient to call pharmacy to know when would it be ready for pick up. Patient expressed appreciation of the call.

## 2022-12-30 ENCOUNTER — Encounter (INDEPENDENT_AMBULATORY_CARE_PROVIDER_SITE_OTHER): Payer: Self-pay | Admitting: Orthopaedic Surgery

## 2022-12-30 ENCOUNTER — Telehealth (INDEPENDENT_AMBULATORY_CARE_PROVIDER_SITE_OTHER): Payer: Self-pay | Admitting: Orthopaedic Surgery

## 2022-12-30 ENCOUNTER — Other Ambulatory Visit (HOSPITAL_BASED_OUTPATIENT_CLINIC_OR_DEPARTMENT_OTHER): Payer: Self-pay

## 2022-12-30 NOTE — Telephone Encounter (Signed)
S/P L THA DOS: 12/22/2022    Patient states her incision is oozing, and she requests follow up from clinical team to discuss what she should do for this. She states she will send photo through [place] shortly    Routing to clinical to assist and look out for [employer]

## 2023-01-01 ENCOUNTER — Inpatient Hospital Stay (INDEPENDENT_AMBULATORY_CARE_PROVIDER_SITE_OTHER)
Admit: 2023-01-01 | Discharge: 2023-01-01 | Disposition: A | Discharge status: Home / Self Care | Payer: No Typology Code available for payment source | Source: Home / Self Care

## 2023-01-01 ENCOUNTER — Other Ambulatory Visit (INDEPENDENT_AMBULATORY_CARE_PROVIDER_SITE_OTHER): Payer: Medicare Other

## 2023-01-01 ENCOUNTER — Ambulatory Visit (INDEPENDENT_AMBULATORY_CARE_PROVIDER_SITE_OTHER): Payer: No Typology Code available for payment source | Admitting: Orthopaedic Surgery

## 2023-01-01 VITALS — BP 120/80 | HR 72 | Temp 98.0°F | Resp 18

## 2023-01-01 DIAGNOSIS — Z96642 Presence of left artificial hip joint: Secondary | ICD-10-CM

## 2023-01-01 DIAGNOSIS — Z4789 Encounter for other orthopedic aftercare: Secondary | ICD-10-CM

## 2023-01-01 DIAGNOSIS — M1612 Unilateral primary osteoarthritis, left hip: Secondary | ICD-10-CM

## 2023-01-01 MED ORDER — OXYCODONE HCL 5 MG OR TABS
5.0000 mg | ORAL_TABLET | ORAL | 0 refills | Status: DC | PRN
Start: 2023-01-01 — End: 2023-03-03

## 2023-01-01 NOTE — Progress Notes (Signed)
I, Pete Corrie Reder, PA-C, interviewed and examined the patient while overseeing the documentation performed by my Medical Scribe, Patzy Villagrana. I have reviewed and revised as necessary the scribe's note and agree with the documented findings and plan of care. Please refer to the scribe's note below for further detailed information regarding the patient encounter, exam, and assessment.     Camilia Caywood C A Braedan Meuth, PA-C  Teaching Associate  Dept of Orthopaedics and Sports Medicine  Joint Replacement/Hip and Knee Arthritis  [place] [facility] [employer]      Patient seen and discussed with Paul Manner MD

## 2023-01-01 NOTE — Progress Notes (Signed)
Patient's Primary Care Physician: Lysle Rubens, MD    Diagnosis: 10 days s/p L total hip arthroplasty    [name] returns for follow-up today for the L THA (DOS 12/22/2022).    HPI:    She is doing well.  Her pain is being well managed, and her incisional tenderness is improving without an increase in redness or swelling.  She is taking oxycodone for pain.  She is ambulating with a wheelchair.    ROS:  The patient reports no tingling, numbness, or weakness in the affected extremity.    EXAM:  [name] looks stated age. she is oriented to time, place, and person.    BP 120/80   Pulse 72   Temp 36.7 C (Temporal)   Resp 18   SpO2 97%      Left hip: Incision is healing, without drainage, erythema, or excessive warmth.    BLE: Calves are supple and non-tender. No evidence of DVT    Skin temperature and turgor are normal throughout the lower extremities, without swelling, or varicosities.    X-RAYS:    Well-aligned, well-fixed L total hip arthroplasty.    IMPRESSION:  Doing well 10 days s/p L total hip arthroplasty.    PLAN:  Keep incision clean and dry.  Pain Medication: PRN, refill of oxycodone was sent electronically to her preferred pharmacy.  Antibiotic prophylaxis for dental work and invasive procedures such as colonoscopy were reviewed, as was the importance of routine radiographic surveillance of joint replacements.  Follow-up will be in 4 weeks without x-rays.      I, Air cabin crew, acted as a Neurosurgeon for [employer], PA-C, in documenting the service or procedure. To the best of my knowledge, I recorded what was dictated by Nicholos Johns, PA-C.      I, Nicholos Johns, PA-C, interviewed and examined the patient while overseeing the documentation performed by my Medical Scribe, Air cabin crew. I have reviewed and revised as necessary the scribe's note and agree with the documented findings and plan of care. Please refer to the scribe's note below for further detailed  information regarding the patient encounter and exam.  01/01/2023. 10:25 AM

## 2023-01-01 NOTE — Patient Instructions (Signed)
Call with concerns or health changes.

## 2023-01-03 ENCOUNTER — Other Ambulatory Visit (HOSPITAL_BASED_OUTPATIENT_CLINIC_OR_DEPARTMENT_OTHER): Payer: Self-pay | Admitting: Pain Management

## 2023-01-03 DIAGNOSIS — M792 Neuralgia and neuritis, unspecified: Secondary | ICD-10-CM

## 2023-01-04 NOTE — Telephone Encounter (Signed)
Received refill request from pharmacy for duloxetine; per 11/18/22 chart note with pharmacy plan was for pt to stop the medication.    Patient needs to establish with new attending--had cancelled her 11/20/22 NEW appt with Dr. Francesco Sor.

## 2023-01-04 NOTE — Telephone Encounter (Signed)
Pt returned phone call.  She states she is currently taking Duloxetine 30 mg daily as she felt the 60 mg was too much.  She adds that she did have a hip replacemnt on 12/22/22 and was hoping this would help her pain but at this time it hasn't.  She would like to continue taking Duloxetine 30 mg daily.    Advised pt to contact PCP for Duloxetine refill as she doesn't have a current provider at this clinic with Dr. Jeris Penta leaving.  She states understanding.  She will set up a New pt appt with another attending.  She had no further questions.

## 2023-01-04 NOTE — Telephone Encounter (Signed)
LVM for patient to return our call to set up a New pt appt with anther attending as Dr. Brandt Loosen has left and to find out if she also stopped taking the Duloxetine.

## 2023-01-05 ENCOUNTER — Other Ambulatory Visit (HOSPITAL_BASED_OUTPATIENT_CLINIC_OR_DEPARTMENT_OTHER): Payer: Self-pay | Admitting: Rheumatology

## 2023-01-05 DIAGNOSIS — M0579 Rheumatoid arthritis with rheumatoid factor of multiple sites without organ or systems involvement: Secondary | ICD-10-CM

## 2023-01-06 MED ORDER — CIMZIA (2 SYRINGE) 200 MG/ML SC PSKT
PREFILLED_SYRINGE | SUBCUTANEOUS | 0 refills | Status: DC
Start: 2023-01-06 — End: 2023-02-04

## 2023-01-06 NOTE — Telephone Encounter (Signed)
Patient is overdue for 3 month follow-up appointment in Jan 2024  One refill authorized.  Please schedule follow up visit.

## 2023-01-07 ENCOUNTER — Other Ambulatory Visit (HOSPITAL_BASED_OUTPATIENT_CLINIC_OR_DEPARTMENT_OTHER): Payer: Self-pay

## 2023-01-13 ENCOUNTER — Ambulatory Visit: Payer: No Typology Code available for payment source | Attending: Neurological Surgery | Admitting: Neurological Surgery

## 2023-01-13 VITALS — BP 117/78 | HR 94 | Temp 97.6°F | Resp 16 | Ht 60.0 in | Wt 175.0 lb

## 2023-01-13 DIAGNOSIS — M48062 Spinal stenosis, lumbar region with neurogenic claudication: Secondary | ICD-10-CM | POA: Insufficient documentation

## 2023-01-13 DIAGNOSIS — M4316 Spondylolisthesis, lumbar region: Secondary | ICD-10-CM

## 2023-01-13 DIAGNOSIS — Z9889 Other specified postprocedural states: Secondary | ICD-10-CM

## 2023-01-13 DIAGNOSIS — T84216A Breakdown (mechanical) of internal fixation device of vertebrae, initial encounter: Secondary | ICD-10-CM

## 2023-01-13 DIAGNOSIS — Z981 Arthrodesis status: Secondary | ICD-10-CM | POA: Insufficient documentation

## 2023-01-13 NOTE — Progress Notes (Deleted)
Rheumatoid doctor  Hip doctor    L4-5 TLIF

## 2023-01-13 NOTE — Progress Notes (Addendum)
CLINIC RETURN PATIENT VISIT    ID/CC: Autumn Camacho is a 57 year old female s/p urgent C7-T1 ACDF (08/21/22, Amin) for cervical myelopathy and C7 laminectomy (08/26/22, Deatra Canter), who was last evaluated in clinic on 11/18/2022 by NP Dareen Piano.    INTERVAL HISTORY:  Patient was last seen  on 11/18/2022 at which time hardware failure was noted on imaging of the C7-T1 stand alone cage. The patient was asymptomatic from this. Her case was reviewed with my other colleagues, who recommend close observation for this asyptomatic finding.    We also discused considering a L4-5 TLIF after L THA with orthopedic surgery . Patient underwent L THA (12/22/22, Dr. Colin Mulders) with orthopedic surgery and was reportedly doing well per chart review.  She has since progressed with PT OT without complication of her left hip surgery.    Today, patient reports that she has had worsening dysphagia, although is able to eat and drink without difficulty.  She is continue to wear her neck brace at all times for comfort, and reports that when she takes off the neck brace she has worsening right greater than left neck and upper extremity weakness.      In regards to her lower extremity symptoms, she states that her left thigh pain has completely resolved after left THA.  She continues to have bilateral lower extremity numbness below the knee and centralized to the anterior shin.  She denies any numbness, tingling, pain, or other change in sensation to her knees or anterior shins, although she has had bilateral TKAs resulting in baseline neurosensory deficits.  States that her symptoms are progressing from last time she was seen in clinic and worse on the right.  Additionally, she endorses central low back pain at approximate level of L4-5.    Denies fevers, chills, shortness of breath, changes in medical conditions.  She is continuing to follow with Dr. Janee Morn with rheumatology.    Patient endorsed preference for her to continue with operative  management of her lumbar spine including discectomy and fusion.    PMH:  Past Medical History:   Diagnosis Date    Chronic right hip pain 02/26/2019    Chronic diarrhea     attributed to RA meds.    Chronic instability of knee, left knee     Diverticulosis     Moderate diverticulosis, w/out hx of diverticulitis or diverticular bleeding.    Fracture     Bilateral elbow fx    Genital herpes     Genital herpes     on chronic valacyclovir with no flares.    Motion sickness     Neck pain     d/t " neck instability , loose fusion plate & screws "    Nerve pain     on Duloxetine.    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 Emergent C7-T1 anterior cervical discectomy & fusion.    CERVICAL LAMINECTOMY  08/27/2022    C7 laminectomy 08/27/2022.    foot Bilateral     Arch surgery    HIP ARTHROPLASTY Right  07/30/2020    HIP ARTHROPLASTY Left 12/22/2022    KNEE ARTHROPLASTY Left 2018    KNEE ARTHROPLASTY Right 2018    KNEE TOTAL ARTHROPLASTY REVISION Left 05/01/2020    KNEE TOTAL ARTHROPLASTY REVISION Left 04/2018    04/2018 & 04/2020.    POSTERIOR CERVICAL LAMINECTOMY  08/27/2022    correction : C7 laminectomy and inferior C6 laminotomy @ [facility]    PR ADENOIDECTOMY PRIMARY <AGE 3      AGE 34  T/A     PR TONSILLECTOMY ONE-HALF <AGE 3      PR UNLISTED PROCEDURE FOOT/TOES Bilateral 2009 R,  2010 L    Reconstructive surgery    PR UNLISTED PROCEDURE PELVIS/HIP JOINT      DOS 07/30/20 R THA       MEDS:  Outpatient Medications Prior to Visit   Medication Sig Dispense Refill    acetaminophen 500 MG tablet Take 2 tablets (1,000 mg) by mouth every 8 hours. 90 tablet 0    amoxicillin 500 MG capsule Take 4 capsules (2,000 mg) by mouth one time as needed  (for dental work).      aspirin 81 MG EC tablet Take 1 tablet (81 mg) by mouth 2 times a day. For 6 weeks to prevent blood clots 84 tablet 0    certolizumab (Cimzia) 2 X 200 MG/ML prefilled syringe kit INJECT 200 MG (1 ML) UNDER THE SKIN EVERY 2 WEEKS (DUE FOR FOLLOW UP, PLEASE MAKE APPOINTMENT) 1 each 0    DULoxetine 30 MG DR capsule Start from 30 mg once a day. In 10 days increase up to 60 mg a day 60 capsule 2    leflunomide 20 MG tablet TAKE 1 TABLET DAILY 90 tablet 1    metroNIDAZOLE 0.75 % cream Apply 1 application topically 2 times a day as needed.      ondansetron 4 MG tablet Take 1 tablet (4 mg) by mouth every 8 hours as needed for nausea/vomiting. 20 tablet 0    oxyCODONE 5 MG tablet Take 1-2 tablets (5-10 mg) by mouth every 4 hours as needed for moderate pain or severe pain. 42 tablet 0    Psyllium Husk powder Take 5.6 g by mouth at bedtime.      senna 8.6 MG tablet Take 2 tablets (17.2 mg) by mouth 2 times a day as needed for constipation. 30 tablet 0    valACYclovir 1 g tablet Take 1 tablet (1,000 mg) by mouth every morning.       No facility-administered medications prior to visit.       ALL:  Review of patient's allergies indicates:  Allergies   Allergen Reactions    Fentanyl Unknown and Other     Per patient, she has "forgotten to breathe"    Hydromorphone ZO:XWRUEA/[id number]    Remicade [Infliximab] Anaphylaxis    Adhesives Skin: Rash     Dermabond causes skin blistering and other Surgical adhesives    Codeine Unknown and Other     "Feels like her skin is crawling"  Extreme feeling of something crawling    Nortriptyline Unknown and Other     Heart palpitations         FH:  Denies family history of bleeding/clotting disorder or anesthetic reaction.     SH:  Social History     Socioeconomic History    Marital status: Married     Spouse name: Not on file    Number of children: Not on file    Years of  education: Not on file    Highest education level: Not on file   Occupational History    Not on file    Tobacco Use    Smoking status: Former     Packs/day: 1.00     Years: 20.00     Additional pack years: 0.00     Total pack years: 20.00     Types: Cigarettes, E-Cigarettes     Start date: 2015     Quit date: 2018     Years since quitting: 6.2    Smokeless tobacco: Never    Tobacco comments:     hx : Off and on smoker but had vape , quit 2018.   Substance and Sexual Activity    Alcohol use: Not Currently     Comment: remote history of alcohol dependence , last drink was 31 yrs ago.    Drug use: Never    Sexual activity: Not on file   Other Topics Concern    Not on file   Social History Narrative    12/2022:  She moved to [place] from [place] in 04/2018. Married. One adult daughter lives here in [place] and her adult son lives in [place].  OK to share medical information with them. No religious objections to healthcare.    Habits: Quit smoking in 2015 after smoking about a pack/day off and on for a total of about 20-25 years. Never chewed tobacco.  Drinks zero alcohol.  Sober since age 67 after drinking somewhat heavily for about 9 years. Never IVDU or recreational drugs.     Social Determinants of Health     Financial Resource Strain: Not on file   Food Insecurity: Unknown (12/22/2022)    Hunger Vital Sign     Worried About Running Out of Food in the Last Year: Never true     Ran Out of Food in the Last Year: Not on file   Transportation Needs: Unknown (12/22/2022)    PRAPARE - Therapist, art (Medical): No     Lack of Transportation (Non-Medical): Not on file   Physical Activity: Not on file   Stress: Not on file   Social Connections: Not on file   Intimate Partner Violence: Unknown (12/22/2022)    Humiliation, Afraid, Rape, and Kick questionnaire     Fear of Current or Ex-Partner: No     Emotionally Abused: Not on file     Physically Abused: Not on file     Sexually Abused: Not on file   Housing Stability: Unknown (12/22/2022)    Housing Stability Vital Sign     Unable to Pay for  Housing in the Last Year: Not on file     Number of Places Lived in the Last Year: Not on file     Unstable Housing in the Last Year: No       ROS:   Positive as noted in HPI. Otherwise, 14 systems reviewed and negative.    PHYSICAL EXAM:  CONSTITUTIONAL/GENERAL APPEARANCE: Well developed and well nourished in no acute distress.  MENTAL STATUS/PSYCH: Alert and oriented to person, place, and date.  HEENT: No sclerus icterus or injection.   RESPIRATORY: breathing comfortably on RA  CARDIOVASCULAR: Regular rate and rhythm  MUSCULOSKELETAL: Moving all extremities spontaneously without deficit. Normal tone.  SKIN: No suspicious lesions appreciated.  PSYCHIATRIC: Full affect. Not responding to any internal stimuli.    NEUROLOGIC:  Mental status: Alert and oriented fully to person, place, and  time.    MOTOR:  Muscle bulk and tone are normal. No pronator drift with arms outstretched.    Strength (L, R):  Knee flexion: 5/5, 5/5  Knee extension: 5/5, 5/5  Dorsiflexion: 5/5, 5/5  Plantarflexion: 5/5, 5/5    SENSORY:  Decreased sensation to BLE below knee      IMAGING:  CT C Spine w/o contrast - 11/18/2022  R screw loosening and increased kyphosis at the C7-T1 ACDF. Disc space narrowing at C3-4 and C6-7.    ASSESSMENT/PLAN:  C7-T1 hardware failure: she states that her symptoms have been relatively stable.  Reasonable that patient can continue to monitor for symptoms of loosening hardware.  After extensive discussion with the patient, she elected for close monitoring and additional referral to speech pathology for VSS.  She may require additional operative fixation at a later date and hardware replacement if her symptoms continue to progress.    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  However, she continues to have residual bilateral distal lower extremity numbness, tingling, pain below the knees.  Patient had  DEXA scan 07/30/2022, which indicated normal bone density. The symptoms are likely related to her significant lumbar stenosis at the level of L4-5, and unlikely to resolve without operative intervention.  Patient also demonstrates spinal stenosis at L2, although denies any symptoms at this dermatomal level.      Given complex medical history, recent operative history, and significant rheumatologic disease, plan for tentative L4-5 TLIF. Operative planning pending clearance by orthopedic surgery, recommendations per rheumatology.      We also discussed risks associated with the surgery including bleeding, infection, wound healing complications, injury to surrounding structures, and complications with anesthesia.     I discussed with the patient the diagnosis, treatment plan, indications for return to clinic, and for expected follow-up. They verbalized an understanding. They were asked if there are any questions or concerns. We discussed the case, until all issues are addressed to the patient's satisfaction and a signed consent form was scanned into his chart for procedure.    Patient was discussed with Dr. Nelson Chimes, Attending Physician.    Ivery Quale, MS4

## 2023-01-15 ENCOUNTER — Telehealth (INDEPENDENT_AMBULATORY_CARE_PROVIDER_SITE_OTHER): Payer: Self-pay | Admitting: Orthopaedic Surgery

## 2023-01-15 DIAGNOSIS — Z96642 Presence of left artificial hip joint: Secondary | ICD-10-CM

## 2023-01-15 NOTE — Telephone Encounter (Signed)
Medication: Oxycodone  tablet    Date of Surgery: 12/22/2022    Pharmacy:         [facility][id number] [phone] [address]  [place] [phone]   [address]  [place] [address]  Phone: [phone] Fax: [phone]        Prescribing Provider: Dr. Colin Mulders    How many pills do they have left? 12 pills    How often are you taking the medication? 3 per day    Routing to Clinical Support Staff to assist.

## 2023-01-18 MED ORDER — OXYCODONE HCL 5 MG OR TABS
5.0000 mg | ORAL_TABLET | Freq: Four times a day (QID) | ORAL | 0 refills | Status: DC | PRN
Start: 2023-01-18 — End: 2023-01-28

## 2023-01-18 NOTE — Telephone Encounter (Signed)
Spoke with patient and informed that requested medication/s has been approved and sent to preferred pharmacy. Advised patient to call pharmacy to know when would it be ready for pick up. Patient expressed appreciation of the call.

## 2023-01-19 ENCOUNTER — Other Ambulatory Visit (HOSPITAL_BASED_OUTPATIENT_CLINIC_OR_DEPARTMENT_OTHER): Payer: Self-pay | Admitting: Rheumatology

## 2023-01-19 DIAGNOSIS — M0579 Rheumatoid arthritis with rheumatoid factor of multiple sites without organ or systems involvement: Secondary | ICD-10-CM

## 2023-01-21 ENCOUNTER — Encounter (HOSPITAL_BASED_OUTPATIENT_CLINIC_OR_DEPARTMENT_OTHER): Payer: Self-pay

## 2023-01-21 ENCOUNTER — Ambulatory Visit
Payer: No Typology Code available for payment source | Attending: Neurological Surgery | Admitting: Speech-Language Pathologist

## 2023-01-21 DIAGNOSIS — R1313 Dysphagia, pharyngeal phase: Secondary | ICD-10-CM | POA: Insufficient documentation

## 2023-01-21 DIAGNOSIS — Z9889 Other specified postprocedural states: Secondary | ICD-10-CM | POA: Insufficient documentation

## 2023-01-21 MED ORDER — LEFLUNOMIDE 20 MG OR TABS
ORAL_TABLET | ORAL | 0 refills | Status: DC
Start: 2023-01-21 — End: 2023-04-21

## 2023-01-21 NOTE — Progress Notes (Signed)
Speech Language Pathology  Outpatient Clinical Swallow Evaluation    January 21, 2023  Time in: 3:00  Duration/min: 53        Referring Provider: Phylliss Blakes, MD       Patient: Autumn Camacho  MRN: [id number]  Encounter Provider: Merrie Roof, SLP  Treatment/Therapy Diagnosis:   Problem List Items Addressed This Visit    None  Visit Diagnoses       Status post cervical discectomy                 CLINICAL IMPRESSIONS  The patient is a 57 year old female with a history of dysphagia and sensation of food sticking in the setting of recent ACDF surgery. At this time Autumn Camacho is able to eat and drink to meet nutritional goals, but swallowing is stressful due to food sticking in her throat on the R, mid throat area since her ACDF surgery in Nov (08/21/22). She presents clinically with [facility] oral phase and  pharyngeal phase dysphagia of unclear etiology .     Recommend: VFSS with speech to further evaluate cause of R sided sticking, mid throat.    Consider possible Laryngology Evaluation for rough/hoarse voice after all pending surgeries are completed.     History of Present Illness:  Autumn Camacho is a 57 year old female with a history of  Autumn Camacho is a 57 year old female s/p urgent C7-T1 ACDF (08/21/22, Amin) for cervical myelopathy and C7 laminectomy (08/26/22, Deatra Canter).      SUBJECTIVE  The patient reports difficulty swallowing in the last few months, food gets stuck feel like on the R, mid throat, started with hiccups. More trouble with dense breads (like bagel) or chicken salad. Notices spicy food not as they irritate her throat. Hiccupping tends to be more with liquids.When it feels like it is stuck, she  will clear her throat. Sometimes It feels like it is dong down the wrong pipe. Has not had ot have the heimlich. Does well with meats if she cuts it is small bites. Burgers, are ok. Sometimes it seems like liquids are going down the wrong way. It almost always feels like things are stuck. Pills are  difficult to swallow/stick. Occasional heartburn, possibly r/t food she is eating or medication. Has not lost weight r/t dysphagia, no pneumonias or pulmonary problems recently.    Eating identity: "I used to eat everything. Pizza, spicy foods".     OBJECTIVE    EAT-10: 19/40 (scores >3 = abnormal), lower scores preferable  Functional Oral Intake Scale (FOIS)* scale 1-7, (higher numbers are better, see scale below) = 7    Oral Mechanism Examination:   Facies: symmetric, no erythema or edema   Labial Findings: WFL  Lingual Findings: WFL  Velar Findings: WFL  Dentition: Fully dentate  Oral Opening: 3 +  finger width (WNL)    Motor Speech Examination:  Alternating Motion Rates: WFL  Sequential Motion Rates: WFL  Speech Intelligibility: 100%  Dysarthria: none  Voice: moderately rough, pitch WNL, prosody WNL.   Sustained phonation: /s/ 26 secs, 21 secs.  /z/ 14 secs, 30 secs.Marland Kitchen  /e/ 17 secs.    Swallowing:  Bolus Trials Administered:  continuous sips thin liquids, applesauce, cracker  Oral Phase Findings: WFL  Pharyngeal Phase Findings: WFL  Successful Swallowing Strategies Implemented: No strategies or restrictions required      Swallow Trials:   Substance/delivery system Cough/choke Voice Quality Comments   Liquid- water by cup no  No change    Puree/applesauce by spoon no No change Timely swallow, good laryngeal elevation (with palpation)   Solid/cracker no clear      3 oz water swallow- passed (no throat clearing or coughing ~ 1 min after drinking)    ASSESSMENT  Please refer to impressions statement above.    Skilled Services Provided: Swallowing evaluation CPT [phone]  Rehabilitation potential is: need more objective evaluation information  Potential Barriers to achieve rehab goals: none  Preferred Learning style: demonstration, written, and verbal  Cultural Practices that Influence Care:  did not ask    PLAN/RECOMMENDATIONS    Solid Recommendations: regular, as tolerated No strategies or restrictions required  Liquid  Recommendations: regular, thin as tolerated  No strategies or restrictions required  Medication: regular, as tolerated and take with plenty of water, especially when taking pills before lying back down  Swallowing Exercises: None currently, need additional information  General Aspiration Precautions: eating/drinking in an upright position only when awake and alert, excellent oral hygiene before and after meals    DISCHARGE GOALS: Functional Discharge Goals are numbered and Short Term Goals are lettered:     1. Patient will tolerate least restrictive diet with no signs/symptoms of aspiration:    1a. Evaluate swallow status   1b. Patient will utilize swallow management techniques with minimal cues   1c. Pt. will carry out swallowing exercise program as developed by treating SLP   1d. Patient will verbalize pulmonary hygiene recommendations for maintaining pulmonary health      SPECIFIC RECOMMENDATIONS:               Aggressive oral care (brush teeth 3x/day) to optimize pulmonary health and Behavioral reflux precautions: smaller, more frequent meals, nothing to eat within 2 hours of bedtime, and avoid spicy foods or that provode symptoms    Functional Oral Intake Scale:      1 Nothing by mouth        2 Occasional attempts at food/liquid   3 Consistent intake of food/liquid (towards meeting nutritional goals)   4 Total oral diet of 1 consistency       5 Total oral diet, but requires modifications, compensations or adaptations   6 Total oral diet, but avoids a particular texture due to difficulty swallowing   7 No restrictions          The plan outlined above was formulated, reviewed, and agreed upon with the patient and/or family/caregiver. Patient/caregiver verbalized understanding and restated information in plan.    Referring Physician/Practitioner Certification Statement:  I Certify the need for these services furnished under this plan of treatment and while under my care.    Autumn Camacho C. Reinaldo Raddle PhD, CCC-SLP, BCS-S  Board  Certified Specialist in Swallowing & Swallowing Disorders  [facility]

## 2023-01-22 ENCOUNTER — Encounter (HOSPITAL_BASED_OUTPATIENT_CLINIC_OR_DEPARTMENT_OTHER): Payer: Self-pay | Admitting: Student in an Organized Health Care Education/Training Program

## 2023-01-22 ENCOUNTER — Ambulatory Visit
Payer: No Typology Code available for payment source | Attending: Student in an Organized Health Care Education/Training Program | Admitting: Student in an Organized Health Care Education/Training Program

## 2023-01-22 VITALS — BP 121/80 | HR 97 | Temp 97.5°F | Ht 60.0 in | Wt 175.0 lb

## 2023-01-22 DIAGNOSIS — M461 Sacroiliitis, not elsewhere classified: Secondary | ICD-10-CM | POA: Insufficient documentation

## 2023-01-22 DIAGNOSIS — M961 Postlaminectomy syndrome, not elsewhere classified: Secondary | ICD-10-CM

## 2023-01-22 DIAGNOSIS — M5416 Radiculopathy, lumbar region: Secondary | ICD-10-CM | POA: Insufficient documentation

## 2023-01-22 NOTE — Progress Notes (Signed)
Ninel Abdella  [id number]    REFERRED BY:  No ref. provider found  PCP:  Lysle Rubens, MD (General)      CHIEF COMPLAINT:  Chief Complaint   Patient presents with    Transfer of Care    Low Back Pain     Right sided low back pain       HISTORY OF PRESENT ILLNESS:   Hattye Siegfried is a 57 year old female with a history of RA, OA, 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 Last saw Dr. Jeris Penta on 10/05/22 where he recommended duloxetine. She is currently on duloxetine 30mg  daily. She said she tried taking the 60mg  dose but it made her feel sick with diarrhea and a shaky sensation. She does feel the 30mg  dose has helped her and she denies every trying 40mg . She said her hip pain has tremendously improved since her last hip surgery in March. She just finished her PT for her hip. Patient saw neurosurgery on 3/27 where in their note they documented they discussed doing L4-5 TLIF pending rheumatology and ortho clearance s/p THA. She said the are focusing on possibly doing the lumbar spine surgery in May or June.   Right THA was done around 2021 or 2022 per the patient.   She wears a neck brace because she said she has a cervical spine screw that is loose and she was told by surgeon to keep brace on to keep her neck straight. She is also getting barium swallow test due to dysphagia since her neck surgery.   Mostly walking with cane and uses wheel chair for long distances. She said her main symptoms right now are Numbness  from her knee down to the BLE and constant low back pain that is burning in nature. She said she feels overall weakness worse on the RLE. She said she at times gets b/l SIJ pain that radiates into her groin as well.   She denies any bowel or bladder incontinence or saddle anesthesia.   She is also currently taking Oxycodone 5mg  q8hrs. She wants to get off the oxycodone and eventually go back to meloxicam. She denies any current side  effects with her medication.       IMAGING:    EXAMINATION:  XR HIP UNILAT  W PELVIS 2-3 VW LEFT     CLINICAL INDICATION:    POST OP      COMPARISON:   December 22, 2022     FINDINGS AND  IMPRESSION     There is a right total hip arthroplasty with cemented femoral component and chronic displaced greater trochanter fracture. There is a left total hip arthroplasty with noncemented prosthesis and no evidence of complication. Degenerative changes are present in the lower lumbar spine. No other bone, joint, or soft tissue abnormality is seen. There are no acute fractures or dislocations.    CLINICAL INDICATION:  Total hip arthroplasty     COMPARISON:  Left hip radiographs 11/06/2022.     FINDINGS AND   IMPRESSION  Well aligned left total hip arthroplasty with immediate postsurgical soft tissue findings. No complication. Right total hip arthroplasty is seen.     CT spine  IMPRESSION  New right-sided screw loosening and increased kyphosis at the C7-T1 ACDF as above.  New periprosthetic, sagittally oriented fracture through the T1 vertebral body.    MRI     IMPRESSION  1.  MR thoracic spine: Degenerative  change at C7-T1 resulting in at least moderate spinal canal stenosis with flattening/compression of the cord and moderate to severe bilateral foraminal narrowing. There is associated with partially visualized intramedullary signal alteration within left aspect of the cord at the C7-T1 levels raising concern for myelopathy, potentially compressive in etiology. Correlate with dedicated MRI cervical spine. No additional evidence of high-grade stenosis or occlusion within the thoracic spine.     2.  MR lumbar spine: Similar appearance of multilevel degenerative change which appears most significant at L4-L5 resulting in severe spinal canal stenosis with compression of the cauda equina nerve roots and severe right-sided foraminal narrowing with compression of the exiting right L4 nerve root.       PRESCRIPTION MONITORING PROGRAM  [place] Medication [place] History (from 01/27/2022 to 01/21/2023)     Dispensed Days Supply Quantity   OXYCODONE HCL (IR) 5 MG TABLET 01/18/2023 5 40 unspecified   OXYCODONE HCL (IR) 5 MG TABLET 01/01/2023 4 42 unspecified   OXYCODONE HCL (IR) 5 MG TABLET 12/29/2022 4 42 unspecified   OXYCODONE HCL (IR) 5 MG TABLET 12/23/2022 3 42 unspecified   OXYCODONE HCL (IR) 5 MG TABLET 10/10/2022 15 30 unspecified   OXYCODONE HCL (IR) 5 MG TABLET 09/30/2022 11 42 unspecified   OXYCODONE HCL (IR) 5 MG TABLET 09/21/2022 4 30 unspecified   OXYCODONE HCL (IR) 5 MG TABLET 09/14/2022 4 30 unspecified   OXYCODONE HCL (IR) 5 MG TABLET 09/09/2022 4 30 unspecified   OXYCODONE HCL (IR) 5 MG TABLET 08/31/2022 4 30 unspecified   OXYCODONE HCL (IR) 5 MG TABLET 08/23/2022 5 42 unspecified   DIAZEPAM 5 MG TABLET 07/01/2022 4 8 unspecified       PAST MEDICAL/SURGICAL HISTORY:    Past Medical History:   Diagnosis Date    Chronic right hip pain 02/26/2019    Cervical disc disorder     Chronic diarrhea     attributed to RA meds.    Chronic instability of knee, left knee     Diverticulosis     Moderate diverticulosis, w/out hx of diverticulitis or diverticular bleeding.    Fracture     Bilateral elbow fx    Genital herpes     Genital herpes     on chronic valacyclovir with no flares.    History of low back pain     Motion sickness     Neck pain     d/t " neck instability , loose fusion plate & screws "    Nerve pain     on Duloxetine.    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 Emergent C7-T1 anterior cervical discectomy & fusion.    CERVICAL LAMINECTOMY  08/27/2022    C7 laminectomy 08/27/2022.    EPIDURAL BLOCK INJECTION      With joint replacement surgeries    foot  Bilateral     Arch surgery    HIP ARTHROPLASTY Right 07/30/2020    HIP ARTHROPLASTY Left 12/22/2022    JOINT REPLACEMENT      Both knees, right hip    KNEE ARTHROPLASTY Left  2018    KNEE ARTHROPLASTY Right 2018    KNEE TOTAL ARTHROPLASTY REVISION Left 05/01/2020    KNEE TOTAL ARTHROPLASTY REVISION Left 04/2018    04/2018 & 04/2020.    LAMINECTOMY  08/27/22    POSTERIOR CERVICAL LAMINECTOMY  08/27/2022    correction : C7 laminectomy and inferior C6 laminotomy @ [facility]    PR ADENOIDECTOMY PRIMARY <AGE 17      AGE 43  T/A     PR TONSILLECTOMY ONE-HALF <AGE 17      PR UNLISTED PROCEDURE FOOT/TOES Bilateral 2009 R,  2010 L    Reconstructive surgery    PR UNLISTED PROCEDURE NECK/THORAX  11/03&09/23    PR UNLISTED PROCEDURE PELVIS/HIP JOINT      DOS 07/30/20 R THA    SPINAL FUSION  08/21/22     The past medical and surgical history above was reviewed and confirmed with the patient.    FAMILY HISTORY:    Family History       Problem (# of Occurrences) Relation (Name,Age of Onset)    Cancer (1) Father (Dad)    Heart Disease (1) Father (Dad)    Migraine Headaches (1) Mother (Mom)    Heart Attack (1) Father (Dad)    Rheumatoid Arthritis (1) Other          The family history above was reviewed and confirmed with the patient.    SOCIAL HISTORY:    Social History     Social History Narrative    12/2022:  She moved to [place] from [place] in 04/2018. Married. One adult daughter lives here in [place] and her adult son lives in [place].  OK to share medical information with them. No religious objections to healthcare.    Habits: Quit smoking in 2015 after smoking about a pack/day off and on for a total of about 20-25 years. Never chewed tobacco.  Drinks zero alcohol.  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 Never IVDU or recreational drugs.       MEDICATIONS/ALLERGIES:     Medications and allergies were reviewed in the medical record and confirmed with the patient.    REVIEW OF SYSTEMS:  The  review of systems documented by our clinic staff was provided by the patient. I did review the ROS and have no further detail to add..    PHYSICAL EXAM:    BP 121/80   Pulse 97   Temp 36.4 C (Tympanic)   Ht 5' (1.524 m)   Wt 79.4 kg (175 lb)   SpO2 94%   BMI 34.18 kg/m     Body mass index is 34.18 kg/m.    General: Marion Downer is well developed, well nourished    Gait: ambulates with assist device: cane: standard wooden , antalgic. Today in wheelchair but able to get up and ambulate a few steps.     Psych: No agitation, not tearful    Cervicothoracic: wearing neck brace by recommendation by surgeon due to loose screw currently and dysphagia. She is getting barium swallow test soon.     UJW:[id number]    Strength:    C5 (shoulder abduction, elbow flexion) 5/5 R 5/5 L    C6 (Wrist extension) 5/5 R 5/5 L    C7 (elbow extension, wrist flexion) 5/5 R 5/5 L    C8 (Finger flexion) 5/5 R 5/5 L    T1 (finger abduction) 5/5 R 5/5 L    Sensation to light touch:  C5 (lateral arm) Intact R Intact L    C6 (lateral forearm/hand) Intact R Intact L    C7 (middle finger) Intact R Intact L    C8 (medial hand/forearm) Intact R Intact L    T1 (medial arm): Intact R Intact L      Lumbosacral:    Lumbar Spinous process Tenderness:NEGATIVE    Lumbar Paraspinous Tenderness: NEGATIVE    ROM: decreased     Facet Loading: POSITIVE    Facet Joint TTP: POSITIVE    Strength:    L2(hip flexion) 5/5 R 5/5 L    L3(Knee extension) 5/5 R 5/5 L    L4(Ankle dorsiflexion) 5/5 R 5/5 L    L5 (knee flexion, Toe dorsiflexion) 4/5 R 5/5 L    S1(Knee flexion, Ankle Plantar flexion) 4/5 R 5/5 L    S2(Toe flexion) 4/5 R 5/5 L    Sensation to Light Touch:    Intact and equal bilateral lower extremity    Sacro-Iliac Joint:    SI Joint Tenderness (Fortin Finger Test):    Left:POSITIVE    Right: POSITIVE    Faber Test:    Left:POSITIVE    Right:POSITIVE    Joint Exam:    Hip ROM:    Left: full and painless    Right: full and painless    Greater  Troch Tenderness:    Left: NEGATIVE    Right: NEGATIVE    Gluteal Tenderness:    Left: NEGATIVE    Right: NEGATIVE      IMPRESSION/PLAN:   Rea Kalama is a 57 year old female with a history of RA, OA, 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 Last saw Dr. Jeris Penta on 10/05/22 where he recommended duloxetine. She is currently on duloxetine 30mg  daily. She said she tried taking the 60mg  dose but it made her feel sick with diarrhea and a shaky sensation. She does feel the 30mg  dose has helped her and she denies every trying 40mg . She said her hip pain has tremendously improved since her last hip surgery in March.   Patient is getting lumbar spinal surgery either in May or June pending rheum recs and ortho clearance s/p left THA.   Patient doing well on duloxetine. We discussed in future may consider increasing to 40mg  and seeing if she continues to get benefit without any side effects.   -recommended continuing HEP and PT  Physical therapy can reduce pain and improve functional status.  We discussed a physical therapy approach focused on gradual, progressive stretching and strengthening and developing and independent home exercise program.   -will follow up with her after her lumbar spine surgery sometime in August or July to check in and see how she is doing with her pain control.   -discussed with her she is showing physical exam signs of SIJ arthropathy. Maybe something to follow up on after surgery to see if still causing pain or any disability with functional status.     We discussed our impressions and the following suggestions/options in detail and  provided her with information in the after visit summary.  Ms. Korenek had no further questions.    I have spent a total of 49 minutes on this visit today including chart review, face to face time with the patient, and documentation and coordination of care after the visit.      Coordination  of care:    We suggested that Ms.  Beverley discuss our consultation findings with her  PCP, Lysle Rubens, MD and other involved health care providers.      Lynnae January, MD  Assistant Professor  [facility]

## 2023-01-22 NOTE — Patient Instructions (Signed)
It was a pleasure meeting you today for our initial consultation visit. Please see the summary of our visit below, as well as any instructions pertaining to the treatment plan discussed today.       Follow up sometime in end of July or August telehealth is okay.

## 2023-01-22 NOTE — Progress Notes (Signed)
Review of Systems   HENT:  Positive for mouth sores.    Musculoskeletal:  Positive for arthralgias, back pain, gait problem, joint swelling and myalgias.   Allergic/Immunologic: Positive for immunocompromised state.   Neurological:  Positive for numbness.   All other systems reviewed and are negative.

## 2023-01-28 ENCOUNTER — Telehealth (INDEPENDENT_AMBULATORY_CARE_PROVIDER_SITE_OTHER): Payer: Self-pay | Admitting: Orthopaedic Surgery

## 2023-01-28 DIAGNOSIS — Z96642 Presence of left artificial hip joint: Secondary | ICD-10-CM

## 2023-01-28 NOTE — Telephone Encounter (Signed)
Medication: oxycodone    Date of Surgery: 12/22/22    Pharmacy:     [facility][id number] [phone] [address] [place] [phone]   [address]  [place] [address]  Phone: [phone] Fax: [phone]    Prescribing Provider: Dr. Leata Mouse    How many pills do they have left? 13 tablets    How often are you taking the medication? 1 every 8 hours    Routing to Clinical Support Staff to assist.

## 2023-01-29 ENCOUNTER — Telehealth (HOSPITAL_BASED_OUTPATIENT_CLINIC_OR_DEPARTMENT_OTHER): Payer: Self-pay | Admitting: Neurological Surgery

## 2023-01-29 MED ORDER — OXYCODONE HCL 5 MG OR TABS
5.0000 mg | ORAL_TABLET | Freq: Four times a day (QID) | ORAL | 0 refills | Status: DC | PRN
Start: 2023-01-29 — End: 2023-03-03

## 2023-01-29 NOTE — Telephone Encounter (Signed)
1st Attempt-  LVM to schedule surgery with Dr. Amin. Informed patient to return call back. Kitana Gage, PCC ([phone]).

## 2023-01-29 NOTE — Telephone Encounter (Signed)
Called patient, let her know the oxycodone refill has been sent to her preferred pharmacy. Advised patient to continue to taper down then off oxycodone as pain decreases. Patient states once she is 6 weeks post-op she will be able resume NSAID's and can taper off then. Encouraged patient to call clinic with any questions or concerns.

## 2023-02-01 ENCOUNTER — Telehealth (HOSPITAL_BASED_OUTPATIENT_CLINIC_OR_DEPARTMENT_OTHER): Payer: Self-pay | Admitting: Neurological Surgery

## 2023-02-01 NOTE — Telephone Encounter (Signed)
TC to patient and discussed surgery with Dr. 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

## 2023-02-02 ENCOUNTER — Other Ambulatory Visit (HOSPITAL_BASED_OUTPATIENT_CLINIC_OR_DEPARTMENT_OTHER): Payer: Self-pay | Admitting: Rheumatology

## 2023-02-02 DIAGNOSIS — M0579 Rheumatoid arthritis with rheumatoid factor of multiple sites without organ or systems involvement: Secondary | ICD-10-CM

## 2023-02-02 NOTE — Telephone Encounter (Signed)
No RN teach needed as pre-op is >7 days prior to surgery. Denies chemo/radiation, transplant hx or smoking/nicotine use. Was on ASA briefly post hip surgery - NO longer on this medication per pt report.    Pt notes they are followed by Dr. Janee Morn w/[facility] - on Leflunomide and Certolizumab (Cimzia). RN will seek clearance.     Pt Reported Med List:  - Meloxicam   - Leflunomide  - Certolizumab (Cimzia)   - Oxycodone - weaning off currently from recent surgery, pt plans to be off by end of the week   - Tylenol  - Senna  - Duloxetine   - Metronidazole cream (PRN)  - Valacyclovir  - Psyllium Husk Powder      RN reviewed post op refill process and policy, provided RN and clinic contact information. Informed pt to bring any FMLA/work forms needed to pre op appt (if applicable).     Plan: RN will seek clearance/recommendations from rheumatologist. Will confirm w/team if med con is needed per previous in person discussion w/provider.

## 2023-02-02 NOTE — Telephone Encounter (Signed)
Update per Dr. Janee Morn w/Rheumatology regarding medications Leflunomide and Certolizumab (Cimzia) :        RN called pt, no answer, left non-descriptive VM and sent mychart message w/details.

## 2023-02-04 MED ORDER — CIMZIA (2 SYRINGE) 200 MG/ML SC PSKT
PREFILLED_SYRINGE | SUBCUTANEOUS | 0 refills | Status: DC
Start: 2023-02-04 — End: 2023-03-03

## 2023-02-05 ENCOUNTER — Ambulatory Visit (INDEPENDENT_AMBULATORY_CARE_PROVIDER_SITE_OTHER): Payer: No Typology Code available for payment source | Admitting: Orthopaedic Surgery

## 2023-02-05 VITALS — BP 126/84 | HR 75 | Temp 98.0°F | Resp 18

## 2023-02-05 DIAGNOSIS — Z96642 Presence of left artificial hip joint: Secondary | ICD-10-CM

## 2023-02-05 DIAGNOSIS — M05752 Rheumatoid arthritis with rheumatoid factor of left hip without organ or systems involvement: Secondary | ICD-10-CM

## 2023-02-05 NOTE — Progress Notes (Signed)
Patient's Primary Care Physician: Lysle Rubens, MD    Diagnosis: 6 weeks s/p L THA    [name] returns for follow-up today for the L THA.    HPI:    She is doing well.  Her pain is absent, and her incisional tenderness is improving without an increase in redness or swelling.  She is walking indoors, she is using a cane and walker in her house, but needs a wheelchair for longer distances because of her back pain.  She feels she is doing very well.    ROS:  The patient reports no tingling, numbness, or weakness in the affected extremity.    EXAM:  [name] is oriented to time, place, and person.   Left hip: Incision is well healed, without drainage, erythema, or excessive warmth.  No pain with log roll of hip.  Baseline decreased sensation in the foot from her back condition, able to dorsiflex/plantarlfex ankle.    IMPRESSION:    Doing very well after left total hip arthroplasty.  She is very satisfied with her surgery.     PLAN:    Antibiotic prophylaxis for dental work and invasive procedures such as colonoscopy were reviewed, as was the importance of routine radiographic surveillance of joint replacements.  Follow-up will be at 3 months with x-rays.    She has overriding toes on bilateral feet which have deformity from her rheumatoid disease.  She would like a referral for this.  We have placed a referral to [facility] for this    Autumn Gong, MD  Resident Physician, PGY-4  Department of Orthopedic Surgery and Sports Medicine

## 2023-02-05 NOTE — Progress Notes (Signed)
Please see the above note by the orthopaedic resident.  I was present with the resident during both the history and physical examination and the results described are corroborated by my own findings.  I discussed the case with the resident, and agree with the findings and plan as documented in that note.      Kourtland Coopman, MD  FRCSC  Hip and Knee Arthritis  Professor  Department of Orthopaedics and Sports Medicine  [facility]

## 2023-02-09 ENCOUNTER — Encounter (HOSPITAL_BASED_OUTPATIENT_CLINIC_OR_DEPARTMENT_OTHER): Payer: Self-pay | Admitting: Neurological Surgery

## 2023-02-09 NOTE — Progress Notes (Signed)
TELEMEDICINE ENCOUNTER     Rheumatology    Date of service: 02/10/2023     Distant Site Telemedicine Encounter  I conducted this encounter via secure, live, face-to-face video conference with the patient. I reviewed the risks and benefits of telemedicine as pertinent to this visit and the patient agreed to proceed.    Provider Location: On-site location (clinic, hospital, on-site office)  Patient Location: At home  Present with patient: No one else present       PRIMARY CARE PROVIDER:  Lysle Rubens, MD  [address]  [place],  [place] [address]     PATIENT: Autumn Camacho    MRN: [id number]    DOB: 24-May-1966    __________________________________     PRIMARY CARE PROVIDER:  Lysle Rubens, MD  [address]  [place],  [place] [address]     Reason for visit:    Rheumatoid arthritis     Last visit:  08/12/2022 via telemedicine     HPI:    Diagnosis: rheumatoid arthritis (1999)   Autoimmune Serologies: Anti-CCP pos, RF pos, ANA neg   Medications Used: methotrexate, enbrel, remicade, humira, orencia, xeljanz, rinvoq, cimzia     INTERVAL HISTORY:  At her last visit, her condition appeared to be responding well to Cimzia. I counseled her on treatment plans. I advised her to maintain Cimzia and leflunomide to see if her condition continues to remain stable. She may hold Cimzia 2 to 3 weeks before her surgery.     Since last visit, she has undergone multiple surgeries including a cervical laminectomy and fusion, and left hip replacement. She is scheduled for a lumbar spine surgery on 02/24/2023.     Today, she reports increased joint pain in her knuckles recently which improved after taking Cimzia. She has been taking Cimzia 200mg  injections every 2 weeks and leflunomide 20mg  daily. She has been taking Cimzia consistently for ~2 months and denies any adverse effects. She takes meloxicam 7.5mg  twice daily for her back pain.    CURRENT PROBLEM LIST:  Patient Active Problem List    Diagnosis Date Noted     Pharyngeal dysphagia [R13.13] 01/21/2023    Diverticulosis [K57.90] 12/22/2022    Recurrent genital HSV (herpes simplex virus) infection [A60.00] 12/17/2022    Class 1 obesity due to excess calories with serious comorbidity and body mass index (BMI) of 34.0 to 34.9 in adult [E66.09, Z68.34] 12/17/2022    Closed fracture of first thoracic vertebra (HCC) [S22.019A] 12/17/2022    Loosening of hardware in spine [facility]) [T84.498A] 12/17/2022    Rheu arthritis w rheu factor of left hip w/o org/sys involv (HCC) [M05.752] 11/06/2022    Depressed mood [R45.89] 10/08/2022    Lumbar spinal stenosis [M48.061] 08/20/2022    Cervical stenosis of spinal canal [M48.02] 08/20/2022     12/2022:  s/p ACDF, now c/b hardware loosening and T1 vertebral body fracture, anticipating revision.       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] 02/02/2020     S/p two revision arthroplasties      Knee dislocation, left, initial encounter [S83.105A] 01/16/2020    Rheumatoid arthritis involving right hand (HCC) [M06.9] 10/17/2019    Arthritis of carpometacarpal (CMC) joints of both thumbs [M18.0] 10/17/2019  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 [M19.041] 10/17/2019    Rheumatoid arthritis (HCC) [M06.9]     Rheumatoid arthritis involving multiple sites (HCC) [M06.9] 02/26/2019    Osteoarthritis of right knee [M17.11] 05/10/2017    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       ALLERGIES:  Allergies as of 02/10/2023 - Reviewed 02/05/2023   Allergen Reaction Noted    Fentanyl Unknown and  Other 03/20/2020    Hydromorphone ZO:XWRUEA/VWUJWJXB 03/20/2020    Remicade [infliximab] Anaphylaxis 08/17/2018    Adhesives Skin: Rash 08/25/2022    Codeine Unknown and Other 08/17/2018    Nortriptyline Unknown and Other 04/08/2020       Past Medical History:  Past Medical History:   Diagnosis Date    Chronic right hip pain 02/26/2019    Cervical disc disorder     Chronic diarrhea     attributed to RA meds.    Chronic instability of knee, left knee     Diverticulosis     Moderate diverticulosis, w/out hx of diverticulitis or diverticular bleeding.    Fracture     Bilateral elbow fx    Genital herpes     Genital herpes     on chronic valacyclovir with no flares.    History of low back pain     Motion sickness     Neck pain     d/t " neck instability , loose fusion plate & screws "    Nerve pain     on Duloxetine.    RA (rheumatoid arthritis) (HCC)     Rheumatoid arthritis (HCC)     Seasonal allergies     Spinal stenosis     Tooth disorder     MISSING TOOTH UPPER LEFT BACK-MOLAR    Urinary incontinence     post Cervical fusion "but getting better "       Family History:  Family History       Problem (# of Occurrences) Relation (Name,Age of Onset)    Cancer (1) Father (Dad)    Heart Disease (1) Father (Dad)    Migraine Headaches (1) Mother (Mom)    Heart Attack (1) Father (Dad)    Rheumatoid Arthritis (1) Other             Past Surgical History:  Past Surgical History:   Procedure Laterality Date    CERVICAL FUSION  08/21/2022    9. Emergent C7-T1 anterior cervical discectomy & fusion.    CERVICAL LAMINECTOMY  08/27/2022    C7 laminectomy 08/27/2022.    EPIDURAL BLOCK INJECTION      With joint replacement surgeries    foot Bilateral     Arch surgery    HIP ARTHROPLASTY Right 07/30/2020    HIP ARTHROPLASTY Left 12/22/2022    JOINT REPLACEMENT      Both knees, right hip    KNEE ARTHROPLASTY Left 2018    KNEE ARTHROPLASTY Right 2018    KNEE TOTAL ARTHROPLASTY REVISION Left 05/01/2020    KNEE TOTAL ARTHROPLASTY REVISION  Left 04/2018    04/2018 & 04/2020.    LAMINECTOMY  08/27/22    POSTERIOR CERVICAL LAMINECTOMY  08/27/2022    correction : C7 laminectomy and inferior C6 laminotomy @     PR ADENOIDECTOMY  PRIMARY <AGE 45      AGE 76  T/A     PR TONSILLECTOMY ONE-HALF <AGE 47      PR UNLISTED PROCEDURE FOOT/TOES Bilateral 2009 R,  2010 L    Reconstructive surgery    PR UNLISTED PROCEDURE NECK/THORAX  11/03&09/23    PR UNLISTED PROCEDURE PELVIS/HIP JOINT      DOS 07/30/20 R THA    SPINAL FUSION  08/21/22       IMMUNIZATION HISTORY:  Immunization History   Administered Date(s) Administered    COVID-19 Pfizer mRNA monovalent 12 yrs and older 01/10/2020, 01/27/2020, 06/06/2020    Hepatitis B recombinant (Heplisav-B) 05/08/2022    Influenza quadrivalent 07/20/2019    Pneumococcal conjugate PCV20 (Prevnar 20) 05/08/2022    Tdap 05/24/2019    Zoster recombinant (Shingrix) 06/22/2019, 07/20/2019, 08/26/2019       ALLERGIES: Fentanyl, Hydromorphone, Remicade [Infliximab], Adhesives, Codeine, and Nortriptyline    SOCIAL HISTORY:  reports that she quit smoking about 9 years ago. Her smoking use included cigarettes. She has a 20 pack-year smoking history. She has never used smokeless tobacco. She reports that she does not currently use alcohol. She reports that she does not use drugs.    FAMILY HISTORY: family history includes Cancer in her father; Heart Attack in her father; Heart Disease in her father; Migraine Headaches in her mother; Rheumatoid Arthritis in an other family member.    PHYSICAL EXAMINATION:   Vitals: No vitals were taken at this visit   General:  Awake, alert, and oriented, no apparent distress, pleasant, and cooperative  Psychologic:  Mood is euthymic, affect is congruent  HEENT:  Normocephalic atraumatic, anicteric sclera, neck supple   Pulmonary:  Non-labored breathing.  Skin:  The patient did not show me any rashes or open ulcers.     LABS:  Results for orders placed or performed during the hospital encounter of 12/22/22    SARS-CoV-2 (COVID-19) Qualitative Rapid PCR   Result Value Ref Range    COVID-19 Coronavirus Qual PCR Specimen Type Nasal swab     COVID-19 Coronavirus Qual PCR Result None detected NDET    COVID-19 Coronavirus Qual PCR Interpretation       This is a negative result. Laboratory testing alone cannot rule out infection, particularly in the presence of clinical risk factors such as symptoms or exposure history.    COVID-19 Qualitative PCR Indication Admission Surveillance    CBC   Result Value Ref Range    WBC 11.33 (H) 4.3 - 10.0 10*3/uL    RBC 4.00 3.80 - 5.00 10*6/uL    Hemoglobin 11.9 11.5 - 15.5 g/dL    Hematocrit 37 16.1 - 45.0 %    MCV 93 81 - 98 fL    MCH 29.8 27.3 - 33.6 pg    MCHC 31.9 (L) 32.2 - 36.5 g/dL    Platelet Count 096 045 - 400 10*3/uL    RDW-CV 13.4 11.0 - 14.5 %   Basic Metabolic Panel   Result Value Ref Range    Sodium 137 135 - 145 meq/L    Potassium 4.1 3.6 - 5.2 meq/L    Chloride 106 98 - 108 meq/L    Carbon Dioxide, Total 22 22 - 32 meq/L    Anion Gap 9 4 - 12    Glucose 126 (H) 62 - 125 mg/dL    Urea Nitrogen 11 8 - 21 mg/dL    Creatinine 4.09 8.11 - 1.02 mg/dL    Calcium 8.1 (L) 8.9 -  10.2 mg/dL    eGFR by CKD-EPI 1610 >60 >59 mL/min/1.73_m2   POC Glucose, Whole Blood - [facility]   Result Value Ref Range    Glucose, Finger Stick POC 93 62 - 125 mg/dL       IMAGING:  None      IMPRESSION/RECOMMENDATIONS:     Autumn Camacho is a 57 year old female with history of bilateral knee and hip replacements who was seen today via telemedicine for evaluation of rheumatoid arthritis. She reported increased pain in hands recently which improved after taking Cimzia. She had been taking Cimzia 200mg  injections every 2 weeks and leflunomide 20mg  daily. She took meloxicam 7.5mg  twice daily for her back pain.      I discussed that the patient's condition appeared to be responding to Cimzia and ordered surveillance labs. I counseled her on treatment plans. I advised her to maintain Cimzia and  leflunomide to see if her symptoms improve further. I renewed her prescription for meloxicam which she may consider reducing after her spine surgery in May.       Assessment:  1. Rheumatoid arthritis   2. Spinal stenosis of lumbar region     PLAN:  The following was discussed with the patient.  Recommendations are as follows:  Continue Cimzia 200mg  sc q2wks  Continue leflunomide 20mg  po qd  Continue meloxicam 7.5mg  po bid prn  Check LFT       Follow up: 3-6 months      02/10/2023 @ 9:14 AM - I, Colonel Bald acted as a Neurosurgeon and documented the service/procedure performed to the best of my knowledge in the presence of Doug Sou, MD who will provide the final review and authentication.  Signed: Colonel Bald

## 2023-02-10 ENCOUNTER — Ambulatory Visit: Payer: No Typology Code available for payment source | Admitting: Rheumatology

## 2023-02-10 ENCOUNTER — Encounter (HOSPITAL_BASED_OUTPATIENT_CLINIC_OR_DEPARTMENT_OTHER): Payer: Self-pay | Admitting: Rheumatology

## 2023-02-10 ENCOUNTER — Encounter (HOSPITAL_COMMUNITY): Payer: Self-pay

## 2023-02-10 DIAGNOSIS — M0579 Rheumatoid arthritis with rheumatoid factor of multiple sites without organ or systems involvement: Secondary | ICD-10-CM

## 2023-02-10 DIAGNOSIS — Z79899 Other long term (current) drug therapy: Secondary | ICD-10-CM

## 2023-02-10 DIAGNOSIS — M48062 Spinal stenosis, lumbar region with neurogenic claudication: Secondary | ICD-10-CM

## 2023-02-10 MED ORDER — MELOXICAM 7.5 MG OR TABS
7.5000 mg | ORAL_TABLET | Freq: Every day | ORAL | 0 refills | Status: DC
Start: 2023-02-10 — End: 2023-03-19

## 2023-02-10 NOTE — Progress Notes (Signed)
I, Shadow Schedler, MD, personally performed the services as described in this documentation. All medical record entries made by the scribe were at my direction and in my presence. I have reviewed the chart and discharge instructions and agree that the record reflects my personal performance and is accurate and complete.

## 2023-02-16 ENCOUNTER — Other Ambulatory Visit (HOSPITAL_BASED_OUTPATIENT_CLINIC_OR_DEPARTMENT_OTHER): Payer: Self-pay | Admitting: Registered Nurse

## 2023-02-16 DIAGNOSIS — M069 Rheumatoid arthritis, unspecified: Secondary | ICD-10-CM

## 2023-02-16 DIAGNOSIS — Z01818 Encounter for other preprocedural examination: Secondary | ICD-10-CM

## 2023-02-17 HISTORY — PX: LUMBAR FUSION: SHX111

## 2023-03-01 ENCOUNTER — Other Ambulatory Visit (HOSPITAL_BASED_OUTPATIENT_CLINIC_OR_DEPARTMENT_OTHER): Payer: Self-pay | Admitting: Rheumatology

## 2023-03-01 DIAGNOSIS — M0579 Rheumatoid arthritis with rheumatoid factor of multiple sites without organ or systems involvement: Secondary | ICD-10-CM

## 2023-03-01 NOTE — Telephone Encounter (Signed)
RN sent Mychart reminder to hold Leflunomide and Certolizumab (Cimzia) 2 weeks prior to surgery. Pt will have pre-op 03/03/2023.

## 2023-03-03 ENCOUNTER — Ambulatory Visit: Payer: No Typology Code available for payment source | Attending: Physician Assistant | Admitting: Physician Assistant

## 2023-03-03 ENCOUNTER — Other Ambulatory Visit (HOSPITAL_BASED_OUTPATIENT_CLINIC_OR_DEPARTMENT_OTHER): Payer: Self-pay | Admitting: Physician Assistant

## 2023-03-03 VITALS — BP 105/74 | HR 97 | Temp 96.8°F | Resp 16

## 2023-03-03 DIAGNOSIS — M5416 Radiculopathy, lumbar region: Secondary | ICD-10-CM

## 2023-03-03 LAB — PROTHROMBIN & PTT
Partial Thromboplastin Time: 27 s (ref 22–35)
Prothrombin INR: 0.9 (ref 0.8–1.3)
Prothrombin Time Patient: 12.4 s (ref 10.7–15.6)

## 2023-03-03 LAB — TYPE AND SCREEN, EXTENDED: Antibody Screen: NEGATIVE

## 2023-03-03 LAB — BASIC METABOLIC PANEL
Anion Gap: 11 (ref 4–12)
Calcium: 9.2 mg/dL (ref 8.9–10.2)
Carbon Dioxide, Total: 23 meq/L (ref 22–32)
Chloride: 106 meq/L (ref 98–108)
Creatinine: 0.48 mg/dL (ref 0.38–1.02)
Glucose: 78 mg/dL (ref 62–125)
Potassium: 4.4 meq/L (ref 3.6–5.2)
Sodium: 140 meq/L (ref 135–145)
Urea Nitrogen: 11 mg/dL (ref 8–21)
eGFR by CKD-EPI 2021: >60 mL/min/{1.73_m2} (ref >59)

## 2023-03-03 LAB — CBC (HEMOGRAM)
Hematocrit: 44 % (ref 36.0–45.0)
Hemoglobin: 13.7 g/dL (ref 11.5–15.5)
MCH: 28.2 pg (ref 27.3–33.6)
MCHC: 30.9 g/dL — ABNORMAL LOW (ref 32.2–36.5)
MCV: 91 fL (ref 81–98)
Platelet Count: 285 10*3/uL (ref 150–400)
RBC: 4.85 10*6/uL (ref 3.80–5.00)
RDW-CV: 14.6 % — ABNORMAL HIGH (ref 11.0–14.5)
WBC: 6.86 10*3/uL (ref 4.3–10.0)

## 2023-03-03 LAB — FIRST EXTRA URINE YELLOW TOP

## 2023-03-03 LAB — URINALYSIS WITH REFLEX CULTURE

## 2023-03-03 LAB — BLOOD TYPE CONFIRMATION: ABO/Rh: A NEG

## 2023-03-03 MED ORDER — CIMZIA (2 SYRINGE) 200 MG/ML SC PSKT
200.0000 mg | PREFILLED_SYRINGE | SUBCUTANEOUS | 2 refills | Status: DC
Start: 2023-03-03 — End: 2023-05-13

## 2023-03-03 NOTE — Patient Instructions (Addendum)
PRE OPERATIVE INSTRUCTIONS:    Please stop taking the following Medications one week prior to your surgery, and continue to hold this medication for one week after surgery:  - NSAIDs (Ibuprofen, Naproxen, Aspirin, meloxicam) or medications that contain these ingredients.  - Vitamins or Supplements    Stop leflunomide and Certolizumab (Cimzia) 2 weeks prior to surgery      You will receive a phone call the night before your surgery to tell you when you will need to arrive at the hospital on the day of your surgery     Please bathe with the Chlorhexidine preparation you were given from your neck down, avoiding your face and genital area, on the night before and the morning of your surgery. Do not use after surgery. Please throw away after surgery.     On the day of surgery you may park in [facility].  Check In is located on 2nd floor of [facility] at [facility]. Bring your ID and insurance card for check in.       Things to know after surgery:    Call for any redness, discharge or opening from your surgical incision  Call for increased swelling around your wound.  Ok to shower 48 hours after surgery, use mild non-fragrant shampoo/soap, avoid active scrubbing of the surgical incision. Do not soak or submerge incision (No baths, pools, hot tubs)  Keep incision dry, clean, and open to air. Do not apply any ointments unless told otherwise.  Do not lift, pull, push objects >10 lbs until cleared by neurosurgery.   Do not participate in any strenuous or high impact activities until cleared by your neurosurgery team.    Call the Clinic Monday to Friday business hours at [phone], after hours, weekends and holidays call call [phone] and follow prompts,  if you present with any of the following     Nausea and vomiting  Temperature over 101.5  New weakness or arms or legs  Bowel or bladder incontinence  Increased problems with balance  Purulent discharge from surgical incision  Altered  mental status  Stroke-like symptoms such as facial drop, weakness, numbness.  Acute onset of weakness      Activity Restrictions  --Do not lift, pull, push objects weighing over 10 pounds for 2 weeks   --Frequent walking is encouraged  --Avoid bending or twisting of lumbar spine  --You may drive once off narcotic pain medications         For after-hours care, call your clinic or doctor's office to be transferred to a triage nurse.

## 2023-03-03 NOTE — Telephone Encounter (Addendum)
RN was updated that team would like ortho surgery clearance. RN see's that pt is followed by Dr. Colin Mulders at [facility].     Plan: routing message to [facility] for clearance.

## 2023-03-03 NOTE — H&P (View-Only) (Signed)
H&P for Upcoming Surgery    History of Present Illness  Autumn Camacho is a 57 year old female with hx of rgent C7-T1 ACDF (08/21/22, Amin) for cervical myelopathy and C7 laminectomy (08/26/22, [facility]) who is now here for a pre-operative appointment for an upcoming L4-5 TLIF with Dr. Amin on 03/16/23.    L THA (12/22/22, Dr. Manner) with orthopedic surgery     Today endorses numbness right leg lateral thigh, across anterior shin . Left foot only numbness. Reports right leg feels like it is weak because she has difficulty using with numbness. No urinary or bowel incontinence, no groin numbness.     Has weaned PRN neck brace and noted some worsening neck pain, occasional pain into arms.  Reports baseline UE weakness and balance difficulty, not worsening.     At home using walker, wheelchair for distances. Was doing PT for a time, no longer.     Off narcotics  Rheum recommendation to hold Leflunomide and Cortolizumab 2 weeks prior to surgery     No history of bleeding or clotting disorders.     NO history of difficulty with anesthesia.     Past Medical History  Past Medical History:   Diagnosis Date    Chronic right hip pain 02/26/2019    Cervical disc disorder     Chronic diarrhea     attributed to RA meds.    Chronic instability of knee, left knee     Diverticulosis     Moderate diverticulosis, w/out hx of diverticulitis or diverticular bleeding.    Fracture     Bilateral elbow fx    Genital herpes     Genital herpes     on chronic valacyclovir with no flares.    History of low back pain     Motion sickness     Neck pain     d/t " neck instability , loose fusion plate & screws "    Nerve pain     on Duloxetine.    RA (rheumatoid arthritis) (HCC)     Rheumatoid arthritis (HCC)     Seasonal allergies     Spinal stenosis     Tooth disorder     MISSING TOOTH UPPER LEFT BACK-MOLAR    Urinary incontinence     post Cervical fusion "but getting better "       Past Surgical History  Past Surgical History:   Procedure  Laterality Date    CERVICAL FUSION  08/21/2022    9. Emergent C7-T1 anterior cervical discectomy & fusion.    CERVICAL LAMINECTOMY  08/27/2022    C7 laminectomy 08/27/2022.    EPIDURAL BLOCK INJECTION      With joint replacement surgeries    foot Bilateral     Arch surgery    HIP ARTHROPLASTY Right 07/30/2020    HIP ARTHROPLASTY Left 12/22/2022    JOINT REPLACEMENT      Both knees, right hip    KNEE ARTHROPLASTY Left 2018    KNEE ARTHROPLASTY Right 2018    KNEE TOTAL ARTHROPLASTY REVISION Left 05/01/2020    KNEE TOTAL ARTHROPLASTY REVISION Left 04/2018    04/2018 & 04/2020.    LAMINECTOMY  08/27/22    POSTERIOR CERVICAL LAMINECTOMY  08/27/2022    correction : C7 laminectomy and inferior C6 laminotomy @ [facility]    PR ADENOIDECTOMY PRIMARY <AGE 14      AGE 21  T/A     PR TONSILLECTOMY ONE-HALF <AGE 14        PR UNLISTED PROCEDURE FOOT/TOES Bilateral 2009 R,  2010 L    Reconstructive surgery    PR UNLISTED PROCEDURE NECK/THORAX  11/03&09/23    PR UNLISTED PROCEDURE PELVIS/HIP JOINT      DOS 07/30/20 R THA    SPINAL FUSION  08/21/22       Medications  Current Outpatient Medications   Medication Sig Dispense Refill    acetaminophen 500 MG tablet Take 2 tablets (1,000 mg) by mouth every 8 hours. 90 tablet 0    amoxicillin 500 MG capsule Take 4 capsules (2,000 mg) by mouth one time as needed (for dental work).      certolizumab (Cimzia 2 Syringe) 200 MG/ML prefilled syringe kit Inject 200 mg under the skin every 2 weeks. 1 each 2    gabapentin 400 MG capsule Take 300 mg by mouth 4 times a day.      leflunomide 20 MG tablet TAKE 1 TABLET DAILY 90 tablet 0    meloxicam 7.5 MG tablet Take 1-2 tablets (7.5-15 mg) by mouth daily. 180 tablet 0    metroNIDAZOLE 0.75 % cream Apply 1 application topically 2 times a day as needed.      Psyllium Husk powder Take 5.6 g by mouth at bedtime.      senna 8.6 MG tablet Take 2 tablets (17.2 mg) by mouth 2 times a day as needed for constipation. 30 tablet 0    valACYclovir 1 g tablet Take 1 tablet  (1,000 mg) by mouth every morning.       No current facility-administered medications for this visit.       Allergies  Review of patient's allergies indicates:  Allergies   Allergen Reactions    Infliximab Anaphylaxis and Other    Adhesives Skin: Rash     Dermabond causes skin blistering and other Surgical adhesives    Cymbalta [Duloxetine Hcl] Skin: Rash and Angioedema/lip, tongue swelling     Caused oral mouth sores     Codeine Other and Unknown     "Feels like her skin is crawling"    Extreme feeling of something crawling    Extreme feeling of something crawling "Feels like her skin is crawling" "Feels like her skin is crawling"      "Feels like her skin is crawling"      "Feels like her skin is crawling" Extreme feeling of something crawling    Cyanoacrylate Skin: Rash     Dermabond    Dermabond causes skin blistering and other Surgical adhesives    Fentanyl Unknown and Other     Per patient, she has "forgotten to breathe"    Per patient, she has "forgotten to breathe" Per patient, she has "forgotten to breathe"      Per patient, she has "forgotten to breathe"    Hydromorphone GI:Nausea/vomiting    Nortriptyline Other and Unknown     Heart palpitations    Heart palpitations Heart palpitations      Heart palpitations       Social History  Lives in [place] with spouse Autumn Camacho and adult daughter. Doesn't work. There are  stairs in the home.   Tobacco: no  Marijuana: no  EtOH: no  Illicit drugs: no     Review of Systems  Constitutional: Negative for fatigue, fever, chills, or weight changes.   Eyes: Glasses. Negative for blurred vision, double vision    Ears, Nose, Mouth, Throat: Swallowing difficulty, saw SLP swallow eval pending. Negative for hearing loss, dizziness, sinus problems, sore throat,   hoarseness  Cardiovascular: Negative for chest pain, palpitations, dyspnea, orthopnea or peripheral edema.   Respiratory: Negative for shortness of breath, cough or wheezing. Negative for OSA.  Gastrointestinal: Negative for  abdominal pain, constipation, diarrhea.  Genitourinary: Negative for dysuria or hematuria.   Musculoskeletal: Diffiuse join pain.   Skin: Negative for rash and easy bruisability  Neurological: See HPI  Hematologic/Lymphatic: Negative for bleeding or clotting disorders.     Physical Exam  BP 105/74   Pulse 97   Temp 36 C (Temporal)   Resp 16   SpO2 93%   General Appearance: NAD   HEENT: Normocephalic.   Cardiac: S1, S2 with regular rate and rhythm. No coronary murmurs.  Respiratory: Regular even respiration, lungs are clear to auscultation bilaterally with good air exchange.   Integumentary: prior cervical incisions well healed   In wheelchair   Valgus deformity bilateral hand    Strength                                    Right       Left   Biceps                         4+/5          4+/5  Triceps                        4+/5         4+/5  Deltoid                         5/5          5/5  Hand grip   4*/5    4*/5  Finger Abduction         5/5          5/5  Hip Flexion  5/5     5/5  Knee Flexion  5/5     5/5  Knee Extension 5/5     5/5  Dorsi Flexion  5/5           5/5  Plantar Flexion 5/5           5/5  EHL    5/5     *  Clonus  Absent  Absent  *Testing limited by valgus deformity     Patient reports hyperesthesia to light touch circumferentially RLE    Assessment  57 year old female scheduled for a L 4-5 TLIF by Dr. Amin on 03/16/23    Discussion and Plan   We had a discussion about the proposed surgical plan. We discussed the risks and benefits of the operation, the use of anesthesia, and the follow up plan. Risks discussed include but are not limited to infection, bleeding, medication reaction, neurological deficits, need for further surgery, seizures, stroke, DVT/PE, coma or even death.  The patient verbalized understanding and has elected to proceed with this surgery.  Pre-Operative History and Physical completed.   Reviewed medications and allergies.   Surgical consent signed by patient and placed in  chart.   Laboratory studies ordered: Basic Metabolic, CBC, Coags, UA, Type and Screen  Prescribed cefazolin as preoperative antibiotics.     Patient verbalized understanding to discontinue all aspirin, NSAIDs, or any other salicylate containing products and fish supplements beginning 7 days prior to surgery, and   to not resume them until cleared by neurosurgery after the procedure.     The patient was instructed to not have any food after midnight prior to the procedure, but can have clear liquids up until two hours before their arrival time.     Answered all of patient's questions to the best of my abilities.     Author:  Elisa Mykenzie Ebanks PA-C  [facility]  [phone]

## 2023-03-03 NOTE — Progress Notes (Unsigned)
H&P for Upcoming Surgery    History of Present Illness  Autumn Camacho is a 57 year old female with hx of rgent C7-T1 ACDF (08/21/22, Amin) for cervical myelopathy and C7 laminectomy (08/26/22, Deatra Canter) who is now here for a pre-operative appointment for an upcoming L4-5 TLIF with Dr. Nelson Chimes on 03/16/23.    L THA (12/22/22, Dr. Colin Mulders) with orthopedic surgery     Today endorses numbness right leg lateral thigh, across anterior shin . Left foot only numbness. Reports right leg feels like it is weak because she has difficulty using with numbness. No urinary or bowel incontinence, no groin numbness.     Has weaned PRN neck brace and noted some worsening neck pain, occasional pain into arms.  Reports baseline UE weakness and balance difficulty, not worsening.     At home using walker, wheelchair for distances. Was doing PT for a time, no longer.     Off narcotics  Rheum recommendation to hold Leflunomide and Cortolizumab 2 weeks prior to surgery     No history of bleeding or clotting disorders.     NO history of difficulty with anesthesia.     Past Medical History  Past Medical History:   Diagnosis Date    Chronic right hip pain 02/26/2019    Cervical disc disorder     Chronic diarrhea     attributed to RA meds.    Chronic instability of knee, left knee     Diverticulosis     Moderate diverticulosis, w/out hx of diverticulitis or diverticular bleeding.    Fracture     Bilateral elbow fx    Genital herpes     Genital herpes     on chronic valacyclovir with no flares.    History of low back pain     Motion sickness     Neck pain     d/t " neck instability , loose fusion plate & screws "    Nerve pain     on Duloxetine.    RA (rheumatoid arthritis) (HCC)     Rheumatoid arthritis (HCC)     Seasonal allergies     Spinal stenosis     Tooth disorder     MISSING TOOTH UPPER LEFT BACK-MOLAR    Urinary incontinence     post Cervical fusion "but getting better "       Past Surgical History  Past Surgical History:   Procedure  Laterality Date    CERVICAL FUSION  08/21/2022    9. Emergent C7-T1 anterior cervical discectomy & fusion.    CERVICAL LAMINECTOMY  08/27/2022    C7 laminectomy 08/27/2022.    EPIDURAL BLOCK INJECTION      With joint replacement surgeries    foot Bilateral     Arch surgery    HIP ARTHROPLASTY Right 07/30/2020    HIP ARTHROPLASTY Left 12/22/2022    JOINT REPLACEMENT      Both knees, right hip    KNEE ARTHROPLASTY Left 2018    KNEE ARTHROPLASTY Right 2018    KNEE TOTAL ARTHROPLASTY REVISION Left 05/01/2020    KNEE TOTAL ARTHROPLASTY REVISION Left 04/2018    04/2018 & 04/2020.    LAMINECTOMY  08/27/22    POSTERIOR CERVICAL LAMINECTOMY  08/27/2022    correction : C7 laminectomy and inferior C6 laminotomy @ [place]    PR ADENOIDECTOMY PRIMARY <AGE 74      AGE 43  T/A     PR TONSILLECTOMY ONE-HALF <AGE 74  PR UNLISTED PROCEDURE FOOT/TOES Bilateral 2009 R,  2010 L    Reconstructive surgery    PR UNLISTED PROCEDURE NECK/THORAX  11/03&09/23    PR UNLISTED PROCEDURE PELVIS/HIP JOINT      DOS 07/30/20 R THA    SPINAL FUSION  08/21/22       Medications  Current Outpatient Medications   Medication Sig Dispense Refill    acetaminophen 500 MG tablet Take 2 tablets (1,000 mg) by mouth every 8 hours. 90 tablet 0    amoxicillin 500 MG capsule Take 4 capsules (2,000 mg) by mouth one time as needed (for dental work).      certolizumab (Cimzia 2 Syringe) 200 MG/ML prefilled syringe kit Inject 200 mg under the skin every 2 weeks. 1 each 2    gabapentin 400 MG capsule Take 300 mg by mouth 4 times a day.      leflunomide 20 MG tablet TAKE 1 TABLET DAILY 90 tablet 0    meloxicam 7.5 MG tablet Take 1-2 tablets (7.5-15 mg) by mouth daily. 180 tablet 0    metroNIDAZOLE 0.75 % cream Apply 1 application topically 2 times a day as needed.      Psyllium Husk powder Take 5.6 g by mouth at bedtime.      senna 8.6 MG tablet Take 2 tablets (17.2 mg) by mouth 2 times a day as needed for constipation. 30 tablet 0    valACYclovir 1 g tablet Take 1 tablet  (1,000 mg) by mouth every morning.       No current facility-administered medications for this visit.       Allergies  Review of patient's allergies indicates:  Allergies   Allergen Reactions    Infliximab Anaphylaxis and Other    Adhesives Skin: Rash     Dermabond causes skin blistering and other Surgical adhesives    Cymbalta [Duloxetine Hcl] Skin: Rash and Angioedema/lip, tongue swelling     Caused oral mouth sores     Codeine Other and Unknown     "Feels like her skin is crawling"    Extreme feeling of something crawling    Extreme feeling of something crawling "Feels like her skin is crawling" "Feels like her skin is crawling"      "Feels like her skin is crawling"      "Feels like her skin is crawling" Extreme feeling of something crawling    Cyanoacrylate Skin: Rash     Dermabond    Dermabond causes skin blistering and other Surgical adhesives    Fentanyl Unknown and Other     Per patient, she has "forgotten to breathe"    Per patient, she has "forgotten to breathe" Per patient, she has "forgotten to breathe"      Per patient, she has "forgotten to breathe"    Hydromorphone ZO:XWRUEA/[id number]    Nortriptyline Other and Unknown     Heart palpitations    Heart palpitations Heart palpitations      Heart palpitations       Social History  Lives in [place] with spouse [name] and adult daughter. Doesn't work. There are  stairs in the home.   Tobacco: no  Marijuana: no  EtOH: no  Illicit drugs: no     Review of Systems  Constitutional: Negative for fatigue, fever, chills, or weight changes.   Eyes: Glasses. Negative for blurred vision, double vision    Ears, Nose, Mouth, Throat: Swallowing difficulty, saw SLP swallow eval pending. Negative for hearing loss, dizziness, sinus problems, sore throat,  hoarseness  Cardiovascular: Negative for chest pain, palpitations, dyspnea, orthopnea or peripheral edema.   Respiratory: Negative for shortness of breath, cough or wheezing. Negative for OSA.  Gastrointestinal: Negative for  abdominal pain, constipation, diarrhea.  Genitourinary: Negative for dysuria or hematuria.   Musculoskeletal: Diffiuse join pain.   Skin: Negative for rash and easy bruisability  Neurological: See HPI  Hematologic/Lymphatic: Negative for bleeding or clotting disorders.     Physical Exam  BP 105/74   Pulse 97   Temp 36 C (Temporal)   Resp 16   SpO2 93%   General Appearance: NAD   HEENT: Normocephalic.   Cardiac: S1, S2 with regular rate and rhythm. No coronary murmurs.  Respiratory: Regular even respiration, lungs are clear to auscultation bilaterally with good air exchange.   Integumentary: prior cervical incisions well healed   In wheelchair   Valgus deformity bilateral hand    Strength                                    Right       Left   Biceps                         4+/5          4+/5  Triceps                        4+/5         4+/5  Deltoid                         5/5          5/5  Hand grip   4*/5    4*/5  Finger Abduction         5/5          5/5  Hip Flexion  5/5     5/5  Knee Flexion  5/5     5/5  Knee Extension 5/5     5/5  Dorsi Flexion  5/5           5/5  Plantar Flexion 5/5           5/5  EHL    5/5     *  Clonus  Absent  Absent  *Testing limited by valgus deformity     Patient reports hyperesthesia to light touch circumferentially RLE    Assessment  57 year old female scheduled for a L 4-5 TLIF by Dr. Nelson Chimes on 03/16/23    Discussion and Plan   We had a discussion about the proposed surgical plan. We discussed the risks and benefits of the operation, the use of anesthesia, and the follow up plan. Risks discussed include but are not limited to infection, bleeding, medication reaction, neurological deficits, need for further surgery, seizures, stroke, DVT/PE, coma or even death.  The patient verbalized understanding and has elected to proceed with this surgery.  Pre-Operative History and Physical completed.   Reviewed medications and allergies.   Surgical consent signed by patient and placed in  chart.   Laboratory studies ordered: Basic Metabolic, CBC, Coags, UA, Type and Screen  Prescribed cefazolin as preoperative antibiotics.     Patient verbalized understanding to discontinue all aspirin, NSAIDs, or any other salicylate containing products and fish supplements beginning 7 days prior to surgery, and  to not resume them until cleared by neurosurgery after the procedure.     The patient was instructed to not have any food after midnight prior to the procedure, but can have clear liquids up until two hours before their arrival time.     Answered all of patient's questions to the best of my abilities.     Author:  Wenda Low PA-C  [facility]  [phone]

## 2023-03-04 LAB — URINALYSIS WITH REFLEX CULTURE
Bacteria, URN: NONE SEEN
Nitrite, URN: NEGATIVE
RBC, URN: NEGATIVE /HPF
Specific Gravity, URN: 1.011 g/mL (ref 1.006–1.027)
WBC, URN: NEGATIVE /HPF

## 2023-03-04 NOTE — Telephone Encounter (Signed)
Received the following update per Dr. Colin Mulders w/Orthopedics:      Plan: RN updated pt via mychart. Routing to [facility], [place] who completed pre-op and [employer].

## 2023-03-05 ENCOUNTER — Telehealth (HOSPITAL_COMMUNITY): Payer: Self-pay

## 2023-03-05 NOTE — Telephone Encounter (Signed)
Updated home medication list prior to surgery

## 2023-03-08 NOTE — Anesthesia Preprocedure Evaluation (Addendum)
Patient: Autumn Camacho  Procedure Information       Date/Time: 03/16/23 1610    Procedure: L4-5 transforaminal lumbar interbody fusion and L4-5 laminectomy (Spine Lumbar)    Location: [facility] MAIN OR 09 / [facility] MAIN OR    Surgeons: Phylliss Blakes, MD            HPI:     Autumn Camacho is a 57 year old female with history of asthma, GERD, RA, cervical stenosis s/p C7 laminectomy and lumbar stenosis presenting for L4-5 TLIF with Dr. Nelson Chimes on 03/16/23.     Relevant Problems   Neurosurgery    (+) Lumbar spinal stenosis      Orthopedics   (+) Rheumatoid arthritis (HCC)      Otolaryngology   (+) Cervical stenosis of spinal canal   (+) Pharyngeal dysphagia      Vascular Surgery   (+) Chronic right-sided low back pain with sciatica     Relevant surgical history:   Past Surgical History:   Procedure Laterality Date    CERVICAL FUSION  08/21/2022    9. Emergent C7-T1 anterior cervical discectomy & fusion.    CERVICAL LAMINECTOMY  08/27/2022    C7 laminectomy 08/27/2022.    EPIDURAL BLOCK INJECTION      With joint replacement surgeries    foot Bilateral     Arch surgery    HIP ARTHROPLASTY Right 07/30/2020    HIP ARTHROPLASTY Left 12/22/2022    JOINT REPLACEMENT      Both knees, right hip    KNEE ARTHROPLASTY Left 2018    KNEE ARTHROPLASTY Right 2018    KNEE TOTAL ARTHROPLASTY REVISION Left 05/01/2020    KNEE TOTAL ARTHROPLASTY REVISION Left 04/2018    04/2018 & 04/2020.    LAMINECTOMY  08/27/22    POSTERIOR CERVICAL LAMINECTOMY  08/27/2022    correction : C7 laminectomy and inferior C6 laminotomy @ [place]    PR ADENOIDECTOMY PRIMARY <AGE 43      AGE 89  T/A     PR TONSILLECTOMY ONE-HALF <AGE 43      PR UNLISTED PROCEDURE FOOT/TOES Bilateral 2009 R,  2010 L    Reconstructive surgery    PR UNLISTED PROCEDURE NECK/THORAX  11/03&09/23    PR UNLISTED PROCEDURE PELVIS/HIP JOINT      DOS 07/30/20 R THA    SPINAL FUSION  08/21/22         Medications:     Outpatient:   Current Outpatient Medications   Medication Instructions     acetaminophen (TYLENOL) 1,000 mg, Oral, Every 8 hours    amoxicillin 2,000 mg, Oral, Once as needed    Cimzia 2 Syringe 200 mg, Subcutaneous, Every 2 weeks    gabapentin (NEURONTIN) 400 mg, Oral, Daily    leflunomide 20 MG tablet TAKE 1 TABLET DAILY    meloxicam (MOBIC) 7.5-15 mg, Oral, Daily    metroNIDAZOLE 0.75 % cream 1 application, Topical, 2 times daily PRN    Psyllium Husk powder 5.6 g, Oral, Nightly    senna (SENOKOT) 17.2 mg, Oral, 2 times daily PRN    valACYclovir 1 g tablet 1 tablet, Oral, Every morning        Inpatient:   Scheduled         Continuous        PRN            Review of patient's allergies indicates:  Allergies   Allergen Reactions    Infliximab Anaphylaxis and Other  Adhesives Skin: Rash     Dermabond causes skin blistering and other Surgical adhesives    Cymbalta [Duloxetine Hcl] Skin: Rash and Angioedema/lip, tongue swelling     Caused oral mouth sores     Codeine Other and Unknown     "Feels like her skin is crawling"    Extreme feeling of something crawling    Extreme feeling of something crawling "Feels like her skin is crawling" "Feels like her skin is crawling"      "Feels like her skin is crawling"      "Feels like her skin is crawling" Extreme feeling of something crawling    Cyanoacrylate Skin: Rash     Dermabond    Dermabond causes skin blistering and other Surgical adhesives    Fentanyl Unknown and Other     Per patient, she has "forgotten to breathe"    Per patient, she has "forgotten to breathe" Per patient, she has "forgotten to breathe"      Per patient, she has "forgotten to breathe"    Hydromorphone ZO:XWRUEA/[id number]    Nortriptyline Other and Unknown     Heart palpitations    Heart palpitations Heart palpitations      Heart palpitations       Social History:   Social History     Tobacco Use    Smoking status: Former     Current packs/day: 0.00     Average packs/day: 1 pack/day for 20.0 years (20.0 ttl pk-yrs)     Types: Cigarettes     Start date: 10/19/1993     Quit date:  10/19/2013     Years since quitting: 9.3    Smokeless tobacco: Never    Tobacco comments:     hx : Off and on smoker but had vape , quit 2018.   Substance Use Topics    Alcohol use: Not Currently     Comment: remote history of alcohol dependence , last drink was 31 yrs ago.    Drug use: Never       Medical History and Review of Systems  Documentation reviewed: Patient health questionnaire and electronic medical record.    Source of information: Chart review and In person visit.  Previous anesthesia: Yes (08/27/2022: 1 attempt Primary Laryngoscopy: [facility] Blade Size: 3 Laryngoscopic View: Grade I) (regional and general)    History of anesthetic complications  (-) History of anesthetic complications.  (-) family history of anesthetic complications.      Functional Status   Unable to exercise due to physical limitation and able to climb 1 flight of stairs without stopping.   Functional status comments:   - needs help with self-care at home and daily activities  - physical disability    Pulmonary   (+) asthma    Neuro/Psych   (+) opioid tolerance  (+) chronic pain (low back)    Cardiovascular   (+) valvular problems/murmurs (09/08/2021: trace MR), MR    HEENT   (+) wears glasses  (+) mouth sores  (+) oral dysphagia    Musculoskeletal   -  cervical stenosis s/p C7 laminectomy   (+) cervical spine disease    Skin   negative skin ROS    GI/Hepatic/Renal   (+) GERD    Endo/Immunology   - steroids in the last year  (+) autoimmune disease, rheumatoid arthritis    Hematology   negative hematology ROS  Oncology   negative hematology/oncology ROS  Physical Exam  Airway  Mallampati:  I  Upper Lip Bite Test: I  TM distance:  >6 cm  Neck ROM:  Full  Mouth Opening:  Normal  Facial Hair:  None    Dental  normal      Cardiovascular  normal    Rhythm:  Regular  Rate:  Normal    Pulmonary  normal    Breath sounds clear to auscultation           03/03/2023    BP 105/74  Pulse 97  Temp 36 C (Temporal)  Resp 16  SpO2 93%        Labs: (last year)    BMP  CBC/Coags   Na 140 03/03/2023  Hb 13.7 03/03/2023   K 4.4 03/03/2023  HCT 44 03/03/2023   Cl 106 03/03/2023  WBC 6.86 03/03/2023   HCO3 23 03/03/2023  PLT 285 03/03/2023   BUN 11 03/03/2023  INR 0.9 03/03/2023   Cr 0.48 03/03/2023  PT 12.4 03/03/2023   Glu 78 03/03/2023  PTT 27 03/03/2023       Misc   eGFR >60 03/03/2023  MCV 91 03/03/2023   A1C 4.5 08/26/2022  BNP - -       LFTs   AST 22 08/25/2022  Albumin 4.0 08/26/2022   ALT 13 08/25/2022  Protein 7.0 08/26/2022   Alk Phos 59 08/25/2022  T Bili 0.4 08/25/2022      ABG    08/21/2022   pH PaCO2 PaO2 HCO3 Lactate   7.36 42 181 (H) 24 0.8         Relevant procedures / diagnostic studies:     EKG 12-Lead 12/16/2022    NORMAL SINUS RHYTHM  NORMAL ECG  NO PREVIOUS ECGS AVAILABLE    TTE 09/08/2021    Interpretation Summary   The left ventricle is normal in size.   The LV wall thickness shows concentric remodeling.   Left ventricular systolic function is normal.   The visual ejection fraction = 55-60%.   The right ventricular systolic function is normal.   The left atrial size is normal.   Right atrial size is normal.   There is trace mitral regurgitation.   The tricuspid valve is normal.   The aortic valve is trileaflet.   The aortic valve opens well.   The baseline ECG displays normal sinus rhythm.       Perioperative Risk Scores:          PAT CLINIC DISCUSSION    ANESTHESIA PLAN   Informed Consent:     Anesthesia Plan discussed with:        Patient and partner/spouse    ASA Score:     ASA: 3  Planned Anesthetic Type:      general

## 2023-03-09 ENCOUNTER — Telehealth (HOSPITAL_BASED_OUTPATIENT_CLINIC_OR_DEPARTMENT_OTHER): Payer: Self-pay | Admitting: Neurological Surgery

## 2023-03-09 NOTE — Telephone Encounter (Signed)
Received completed Health Care Provider form. Uploaded to media. Returned completed form to pt via mychart and faxed to requested fax number per form ([phone]). Received confirmation fax sent successfully.

## 2023-03-16 ENCOUNTER — Inpatient Hospital Stay (HOSPITAL_COMMUNITY): Payer: No Typology Code available for payment source

## 2023-03-16 ENCOUNTER — Inpatient Hospital Stay (HOSPITAL_COMMUNITY): Payer: No Typology Code available for payment source | Admitting: Anesthesiology

## 2023-03-16 ENCOUNTER — Encounter (HOSPITAL_COMMUNITY)
Admission: RE | Disposition: A | Discharge status: Home Health | Payer: Self-pay | Source: Home / Self Care | Attending: Neurological Surgery

## 2023-03-16 ENCOUNTER — Inpatient Hospital Stay (HOSPITAL_COMMUNITY): Payer: No Typology Code available for payment source | Admitting: Neurological Surgery

## 2023-03-16 ENCOUNTER — Inpatient Hospital Stay
Admission: RE | Admit: 2023-03-16 | Discharge: 2023-03-19 | DRG: 455 | Disposition: A | Discharge status: Home Health | Payer: No Typology Code available for payment source | Source: Home / Self Care | Attending: Neurological Surgery | Admitting: Neurological Surgery

## 2023-03-16 ENCOUNTER — Other Ambulatory Visit: Payer: Self-pay

## 2023-03-16 ENCOUNTER — Encounter (HOSPITAL_COMMUNITY): Payer: Self-pay

## 2023-03-16 ENCOUNTER — Inpatient Hospital Stay (HOSPITAL_BASED_OUTPATIENT_CLINIC_OR_DEPARTMENT_OTHER): Payer: No Typology Code available for payment source

## 2023-03-16 DIAGNOSIS — M48061 Spinal stenosis, lumbar region without neurogenic claudication: Secondary | ICD-10-CM | POA: Insufficient documentation

## 2023-03-16 DIAGNOSIS — E669 Obesity, unspecified: Secondary | ICD-10-CM

## 2023-03-16 DIAGNOSIS — Z6834 Body mass index (BMI) 34.0-34.9, adult: Secondary | ICD-10-CM

## 2023-03-16 DIAGNOSIS — M4316 Spondylolisthesis, lumbar region: Secondary | ICD-10-CM | POA: Diagnosis present

## 2023-03-16 DIAGNOSIS — M5416 Radiculopathy, lumbar region: Secondary | ICD-10-CM

## 2023-03-16 DIAGNOSIS — K5909 Other constipation: Secondary | ICD-10-CM

## 2023-03-16 DIAGNOSIS — R1313 Dysphagia, pharyngeal phase: Secondary | ICD-10-CM | POA: Diagnosis present

## 2023-03-16 DIAGNOSIS — I959 Hypotension, unspecified: Secondary | ICD-10-CM | POA: Diagnosis not present

## 2023-03-16 DIAGNOSIS — M48062 Spinal stenosis, lumbar region with neurogenic claudication: Secondary | ICD-10-CM

## 2023-03-16 DIAGNOSIS — M069 Rheumatoid arthritis, unspecified: Secondary | ICD-10-CM | POA: Diagnosis present

## 2023-03-16 DIAGNOSIS — G8929 Other chronic pain: Secondary | ICD-10-CM

## 2023-03-16 DIAGNOSIS — Z9889 Other specified postprocedural states: Secondary | ICD-10-CM

## 2023-03-16 LAB — TYPE AND SCREEN, EXTENDED: ABO/Rh: A NEG

## 2023-03-16 LAB — GLUCOSE POC, ~~LOC~~
Glucose (POC): 96 mg/dL (ref 62–125)
Glucose (POC): 88 mg/dL (ref 62–125)

## 2023-03-16 LAB — COVID-19 CORONAVIRUS QUALITATIVE PCR

## 2023-03-16 SURGERY — FUSION, SPINE, LUMBAR, TRANSFORAMINAL LUMBAR INTERBODY FUSION, 1 LEVEL
Anesthesia: General | Site: Spine Lumbar | Wound class: Class I/ Clean

## 2023-03-16 MED ORDER — SUGAMMADEX SODIUM 200 MG/2ML IV SOLN
INTRAVENOUS | Status: AC
Start: 2023-03-16 — End: 2023-03-16
  Filled 2023-03-16: qty 2

## 2023-03-16 MED ORDER — ONDANSETRON HCL 4 MG/2ML IJ SOLN
INTRAMUSCULAR | Status: AC
Start: 2023-03-16 — End: 2023-03-16
  Filled 2023-03-16: qty 2

## 2023-03-16 MED ORDER — VANCOMYCIN HCL 1 G IV SOLR
INTRAVENOUS | Status: DC | PRN
Start: 2023-03-16 — End: 2023-03-16
  Administered 2023-03-16: 1 g via TOPICAL

## 2023-03-16 MED ORDER — POLYETHYLENE GLYCOL 3350 17 G OR PACK
17.0000 g | PACK | Freq: Every day | ORAL | Status: DC
Start: 2023-03-16 — End: 2023-03-19
  Filled 2023-03-16 (×3): qty 1

## 2023-03-16 MED ORDER — VALACYCLOVIR HCL 1 G OR TABS
1000.0000 mg | ORAL_TABLET | Freq: Every day | ORAL | Status: DC
Start: 2023-03-16 — End: 2023-03-17
  Administered 2023-03-16 – 2023-03-17 (×2): 1000 mg via ORAL
  Filled 2023-03-16 (×2): qty 1

## 2023-03-16 MED ORDER — GABAPENTIN 300 MG OR CAPS
400.0000 mg | ORAL_CAPSULE | Freq: Every day | ORAL | Status: DC
Start: 2023-03-16 — End: 2023-03-19
  Administered 2023-03-16 – 2023-03-19 (×4): 400 mg via ORAL
  Filled 2023-03-16 (×4): qty 1

## 2023-03-16 MED ORDER — ACETAMINOPHEN 500 MG OR TABS
1000.0000 mg | ORAL_TABLET | Freq: Four times a day (QID) | ORAL | Status: DC
Start: 2023-03-16 — End: 2023-03-19
  Administered 2023-03-16 – 2023-03-19 (×12): 1000 mg via ORAL
  Filled 2023-03-16 (×12): qty 2

## 2023-03-16 MED ORDER — KETAMINE HCL-SODIUM CHLORIDE 50-0.9 MG/5ML-% IV SOSY
PREFILLED_SYRINGE | INTRAVENOUS | Status: AC
Start: 2023-03-16 — End: 2023-03-16
  Filled 2023-03-16: qty 5

## 2023-03-16 MED ORDER — FENTANYL CITRATE (PF) 50 MCG/ML IJ SOLN WRAPPER (ANESTHESIA OSM ONLY)
INTRAMUSCULAR | Status: DC | PRN
Start: 2023-03-16 — End: 2023-03-16
  Administered 2023-03-16 (×2): 25 ug via INTRAVENOUS
  Administered 2023-03-16: 75 ug via INTRAVENOUS
  Administered 2023-03-16: 25 ug via INTRAVENOUS
  Administered 2023-03-16: 50 ug via INTRAVENOUS

## 2023-03-16 MED ORDER — HYDROMORPHONE HCL 1 MG/ML IJ SOLN
INTRAMUSCULAR | Status: DC | PRN
Start: 2023-03-16 — End: 2023-03-16
  Administered 2023-03-16 (×2): .5 mg via INTRAVENOUS

## 2023-03-16 MED ORDER — BUPIVACAINE-EPINEPHRINE 0.25% -1:200000 IJ SOLN
INTRAMUSCULAR | Status: DC | PRN
Start: 2023-03-16 — End: 2023-03-16
  Administered 2023-03-16: 10 mL via INTRAMUSCULAR

## 2023-03-16 MED ORDER — SENNOSIDES 8.6 MG OR TABS
17.2000 mg | ORAL_TABLET | Freq: Two times a day (BID) | ORAL | Status: DC
Start: 2023-03-16 — End: 2023-03-19
  Administered 2023-03-16 – 2023-03-19 (×6): 17.2 mg via ORAL
  Filled 2023-03-16 (×6): qty 2

## 2023-03-16 MED ORDER — ACETAMINOPHEN 10 MG/ML IV SOLN
INTRAVENOUS | Status: AC
Start: 2023-03-16 — End: 2023-03-16
  Filled 2023-03-16: qty 100

## 2023-03-16 MED ORDER — SODIUM CHLORIDE 0.9% IV BOLUS
500.0000 mL | Freq: Once | INTRAVENOUS | Status: AC
Start: 2023-03-16 — End: 2023-03-16
  Administered 2023-03-16: 500 mL via INTRAVENOUS

## 2023-03-16 MED ORDER — CEFAZOLIN SODIUM-DEXTROSE 2-4 GM/100ML-% IV SOLN
INTRAVENOUS | Status: AC
Start: 2023-03-16 — End: 2023-03-16
  Filled 2023-03-16: qty 200

## 2023-03-16 MED ORDER — LACTATED RINGERS IV SOLN
100.0000 mL/h | INTRAVENOUS | Status: DC
Start: 2023-03-16 — End: 2023-03-16

## 2023-03-16 MED ORDER — PROPOFOL 200 MG/20ML IV EMUL
INTRAVENOUS | Status: AC
Start: 2023-03-16 — End: 2023-03-16
  Filled 2023-03-16: qty 40

## 2023-03-16 MED ORDER — FENTANYL CITRATE (PF) 100 MCG/2ML IJ SOLN
INTRAMUSCULAR | Status: AC
Start: 2023-03-16 — End: 2023-03-16
  Filled 2023-03-16: qty 2

## 2023-03-16 MED ORDER — REMIFENTANIL HCL 1 MG IV SOLR
INTRAVENOUS | Status: AC
Start: 2023-03-16 — End: 2023-03-16
  Filled 2023-03-16: qty 1

## 2023-03-16 MED ORDER — CALCIUM CARBONATE ANTACID 500 MG OR CHEW
1000.0000 mg | CHEWABLE_TABLET | Freq: Three times a day (TID) | ORAL | Status: DC | PRN
Start: 2023-03-16 — End: 2023-03-19

## 2023-03-16 MED ORDER — ACETAMINOPHEN 500 MG OR TABS
1000.0000 mg | ORAL_TABLET | Freq: Four times a day (QID) | ORAL | Status: DC | PRN
Start: 2023-03-16 — End: 2023-03-16

## 2023-03-16 MED ORDER — CEFAZOLIN SODIUM-DEXTROSE 2-4 GM/100ML-% IV SOLN
2.0000 g | Freq: Three times a day (TID) | INTRAVENOUS | Status: AC
Start: 2023-03-16 — End: 2023-03-17
  Administered 2023-03-16 – 2023-03-17 (×3): 2 g via INTRAVENOUS
  Filled 2023-03-16 (×3): qty 100

## 2023-03-16 MED ORDER — INSULIN REGULAR 100 UNITS IN NS 100 ML INFUSION (PKG PREMIX)
INJECTION | Status: AC
Start: 2023-03-16 — End: 2023-03-16
  Filled 2023-03-16: qty 100

## 2023-03-16 MED ORDER — MELATONIN 3 MG OR TABS
3.0000 mg | ORAL_TABLET | Freq: Every evening | ORAL | Status: DC | PRN
Start: 2023-03-16 — End: 2023-03-19

## 2023-03-16 MED ORDER — NALOXONE HCL 0.4 MG/ML IJ SOLN
0.0800 mg | INTRAMUSCULAR | Status: DC | PRN
Start: 2023-03-16 — End: 2023-03-18

## 2023-03-16 MED ORDER — LACTATED RINGERS IV SOLN
INTRAVENOUS | Status: DC | PRN
Start: 2023-03-16 — End: 2023-03-16

## 2023-03-16 MED ORDER — SODIUM CHLORIDE 0.9 % IV SOLN
INTRAVENOUS | Status: DC | PRN
Start: 2023-03-16 — End: 2023-03-16
  Administered 2023-03-16: .1 ug/kg/min via INTRAVENOUS

## 2023-03-16 MED ORDER — SUGAMMADEX SODIUM 200 MG/2ML IV SOLN
INTRAVENOUS | Status: DC | PRN
Start: 2023-03-16 — End: 2023-03-16
  Administered 2023-03-16: 200 mg via INTRAVENOUS

## 2023-03-16 MED ORDER — HYDROMORPHONE HCL 1 MG/ML IJ SOLN
INTRAMUSCULAR | Status: AC
Start: 2023-03-16 — End: 2023-03-16
  Filled 2023-03-16: qty 1

## 2023-03-16 MED ORDER — PHENYLEPHRINE HCL-NACL 25-0.9 MG/250ML-% IV SOLN
INTRAVENOUS | Status: AC
Start: 2023-03-16 — End: 2023-03-16
  Filled 2023-03-16: qty 250

## 2023-03-16 MED ORDER — MORPHINE SULFATE 5 MG/ML PCA IN NS 25 ML BAG (PKG PREMIX)
INTRAVENOUS | Status: DC
Start: 2023-03-16 — End: 2023-03-18
  Filled 2023-03-16 (×2): qty 125
  Filled 2023-03-16: qty 25

## 2023-03-16 MED ORDER — LEFLUNOMIDE 10 MG OR TABS
20.0000 mg | ORAL_TABLET | Freq: Every day | ORAL | Status: DC
Start: 2023-03-16 — End: 2023-03-19
  Administered 2023-03-17 – 2023-03-19 (×3): 20 mg via ORAL
  Filled 2023-03-16 (×3): qty 2
  Filled 2023-03-16 (×2): qty 1
  Filled 2023-03-16: qty 2

## 2023-03-16 MED ORDER — FAMOTIDINE 20 MG OR TABS
20.0000 mg | ORAL_TABLET | Freq: Every evening | ORAL | Status: DC
Start: 2023-03-16 — End: 2023-03-19
  Filled 2023-03-16 (×3): qty 1

## 2023-03-16 MED ORDER — PROPOFOL 10 MG/ML IV EMUL WRAPPER (OSM ONLY)
INTRAVENOUS | Status: DC | PRN
Start: 2023-03-16 — End: 2023-03-16
  Administered 2023-03-16: 125 ug/kg/min via INTRAVENOUS

## 2023-03-16 MED ORDER — OXYCODONE HCL 5 MG OR TABS
5.0000 mg | ORAL_TABLET | ORAL | Status: DC | PRN
Start: 2023-03-16 — End: 2023-03-16

## 2023-03-16 MED ORDER — ACETAMINOPHEN 10 MG/ML IV SOLN
INTRAVENOUS | Status: DC | PRN
Start: 2023-03-16 — End: 2023-03-16
  Administered 2023-03-16: 1000 mg via INTRAVENOUS

## 2023-03-16 MED ORDER — ONDANSETRON HCL 4 MG/2ML IJ SOLN
4.0000 mg | INTRAMUSCULAR | Status: DC | PRN
Start: 2023-03-16 — End: 2023-03-16

## 2023-03-16 MED ORDER — FENTANYL CITRATE (PF) 100 MCG/2ML IJ SOLN
25.0000 ug | INTRAMUSCULAR | Status: DC | PRN
Start: 2023-03-16 — End: 2023-03-16

## 2023-03-16 MED ORDER — METHOCARBAMOL 750 MG OR TABS
750.0000 mg | ORAL_TABLET | Freq: Four times a day (QID) | ORAL | Status: DC | PRN
Start: 2023-03-16 — End: 2023-03-18
  Administered 2023-03-17 – 2023-03-18 (×3): 750 mg via ORAL
  Filled 2023-03-16 (×3): qty 1

## 2023-03-16 MED ORDER — PROPOFOL 500 MG/50ML IV EMUL INFUSION
INTRAVENOUS | Status: AC
Start: 2023-03-16 — End: 2023-03-16
  Filled 2023-03-16: qty 50

## 2023-03-16 MED ORDER — DIPHENHYDRAMINE HCL 25 MG OR CAPS
25.0000 mg | ORAL_CAPSULE | Freq: Four times a day (QID) | ORAL | Status: DC | PRN
Start: 2023-03-16 — End: 2023-03-19

## 2023-03-16 MED ORDER — DEXAMETHASONE SOD PHOSPHATE PF 10 MG/ML IJ SOLN
INTRAMUSCULAR | Status: DC | PRN
Start: 2023-03-16 — End: 2023-03-16
  Administered 2023-03-16: 10 mg via INTRAVENOUS

## 2023-03-16 MED ORDER — PHENYLEPHRINE HCL-NACL 25-0.9 MG/250ML-% IV SOLN
INTRAVENOUS | Status: DC | PRN
Start: 2023-03-16 — End: 2023-03-16
  Administered 2023-03-16: 100 ug via INTRAVENOUS
  Administered 2023-03-16: .4 ug/kg/min via INTRAVENOUS

## 2023-03-16 MED ORDER — ROCURONIUM BROMIDE 50 MG/5ML IV SOLN
INTRAVENOUS | Status: DC | PRN
Start: 2023-03-16 — End: 2023-03-16
  Administered 2023-03-16: 50 mg via INTRAVENOUS
  Administered 2023-03-16: 10 mg via INTRAVENOUS
  Administered 2023-03-16: 30 mg via INTRAVENOUS
  Administered 2023-03-16: 10 mg via INTRAVENOUS

## 2023-03-16 MED ORDER — SENNOSIDES 8.6 MG OR TABS
17.2000 mg | ORAL_TABLET | Freq: Two times a day (BID) | ORAL | Status: DC | PRN
Start: 2023-03-16 — End: 2023-03-19

## 2023-03-16 MED ORDER — ONDANSETRON HCL 4 MG/2ML IJ SOLN
INTRAMUSCULAR | Status: DC | PRN
Start: 2023-03-16 — End: 2023-03-16
  Administered 2023-03-16: 4 mg via INTRAVENOUS

## 2023-03-16 MED ORDER — MORPHINE SULFATE 5 MG/ML PCA CLINICIAN BOLUS FROM PUMP
1.0000 mg | INTRAVENOUS | Status: DC | PRN
Start: 2023-03-16 — End: 2023-03-18

## 2023-03-16 MED ORDER — ONDANSETRON HCL 4 MG OR TABS
4.0000 mg | ORAL_TABLET | Freq: Three times a day (TID) | ORAL | Status: DC | PRN
Start: 2023-03-16 — End: 2023-03-19

## 2023-03-16 MED ORDER — CEFAZOLIN SODIUM 1 G IJ SOLR
INTRAMUSCULAR | Status: DC | PRN
Start: 2023-03-16 — End: 2023-03-16
  Administered 2023-03-16: 2 g via INTRAVENOUS

## 2023-03-16 MED ORDER — ONDANSETRON HCL 4 MG/2ML IJ SOLN
4.0000 mg | Freq: Three times a day (TID) | INTRAMUSCULAR | Status: DC | PRN
Start: 2023-03-16 — End: 2023-03-19

## 2023-03-16 MED ORDER — MIDAZOLAM HCL (PF) 1 MG/ML IJ SOLN WRAPPER (ANESTHESIA OSM ONLY)
INTRAMUSCULAR | Status: DC | PRN
Start: 2023-03-16 — End: 2023-03-16
  Administered 2023-03-16: 2 mg via INTRAVENOUS

## 2023-03-16 MED ORDER — MIDAZOLAM HCL (PF) 2 MG/2ML IJ SOLN
INTRAMUSCULAR | Status: AC
Start: 2023-03-16 — End: 2023-03-16
  Filled 2023-03-16: qty 2

## 2023-03-16 MED ORDER — LIDOCAINE HCL 2 % IJ SOLN
INTRAMUSCULAR | Status: DC | PRN
Start: 2023-03-16 — End: 2023-03-16
  Administered 2023-03-16: 100 mg via INTRAVENOUS

## 2023-03-16 MED ORDER — PROPOFOL 10 MG/ML IV EMUL WRAPPER (OSM ONLY)
INTRAVENOUS | Status: DC | PRN
Start: 2023-03-16 — End: 2023-03-16
  Administered 2023-03-16: 200 mg via INTRAVENOUS

## 2023-03-16 MED ORDER — KETAMINE HCL 10 MG/ML IJ SOLN/IV SOSY WRAPPER (ANESTHESIA OSM ONLY)
Status: DC | PRN
Start: 2023-03-16 — End: 2023-03-16
  Administered 2023-03-16 (×2): 25 mg via INTRAVENOUS

## 2023-03-16 MED ORDER — BUPIVACAINE-EPINEPHRINE (PF) 0.25% -1:200000 IJ SOLN
INTRAMUSCULAR | Status: DC | PRN
Start: 2023-03-16 — End: 2023-03-16
  Administered 2023-03-16: 40 mL via INTRAMUSCULAR

## 2023-03-16 SURGICAL SUPPLY — 52 items
BAND BAG 36INX30IN (Other) ×2 IMPLANT
BATTERY SURG DRIVER LF (Other) ×4 IMPLANT
BURR 3MM MATCH HEAD CARBIDE (Bur) ×1 IMPLANT
CAP CREO THREADED LOCKING ×4 IMPLANT
CATHETER TRAY COMPLETE CARE TEMPERATURE SENSING 16FR FOLEY (Catheter) ×1 IMPLANT
COVER OR TABLE THIGH PAD COVER TABLE SPINAL SURG (Other) ×4 IMPLANT
DRAPE CLOTH 54X90 ST (Drape) ×1 IMPLANT
DRAPE CLOTH 60X60 ST (Drape) ×1 IMPLANT
DRAPE EQUIP C-ARMOR EXPAND C ARM FLUOROSCOPE (Drape) ×1 IMPLANT
DRAPE EQUIP ORS 66INX44IN 2DISC (Drape) ×1 IMPLANT
DRAPE RADIOLOGY Z FOLD STERILE (Drape) ×2 IMPLANT
DRAPE STERI-DRAPE 23INX17IN ADHESIVE STRIP LG TOWEL (Drape) ×4 IMPLANT
DRESSING FOAM MEPILEX BORDER 3INX3IN (Dressing) ×2 IMPLANT
DRESSING FOAM MEPILEX BORDER 4IN X 8IN (Dressing) ×2 IMPLANT
DRESSING FOAM MEPILEX BORDER 6INX6IN (Dressing) ×2 IMPLANT
DRESSING FOAM MEPILEX BORDER FLEX 3IN X 3IN (Dressing) ×2 IMPLANT
DRESSING FOAM MEPILEX BORDER FLEX 6IN X 6IN (Dressing) ×2 IMPLANT
DRESSING FOAM SACRUM MEPILEX 7.9INX6.3IN (Dressing) ×2 IMPLANT
ELECTRODE PATIENT RETURN POLYHESVIE II REM W/ CORD (Other) ×2 IMPLANT
FORCEP BIPOLAR OLSEN 8 1/4IN 1.5MM BAYONET INSULATE (Other) ×1 IMPLANT
GLOVE SURG 8 BIOGEL MICRO INDICATOR PF (Glove) ×2 IMPLANT
GLOVE SURG BIOGEL 7 1/2 ULTRATOUCH PF (Glove) ×2 IMPLANT
GOWN AURORA AAMI LEVEL 3 XL BLUE (Gown) ×2 IMPLANT
GRAFT BONE INFUSE BMP XS ×1 IMPLANT
INSERT POSITION PRONEVIEW SM FOAM CUSHION ADULT LF (Other) IMPLANT
KIT DRAIN 400CC 1/8IN 3SPRING EVACUATOR (Drain) ×1 IMPLANT
KIT DRESSING PREVENA PEEL AND PLACE 20CM (Dressing) ×1 IMPLANT
NEEDLE HYPODERMIC SAFETY 21GA 1-1/2IN MAGELLAN (Needle) ×1 IMPLANT
PACK CUSTOM ORTHO SPINE (Pack) ×1 IMPLANT
PACK GOWN 4 PACK W/TOWELS (Gown) ×1 IMPLANT
PENCIL SURGICAL L15FT DIA 3/8IN PLUMEPEN ELITE (Other) ×2 IMPLANT
PILLOW POSITION SHEARGUARD CHEST SPINAL SURG TABLE (Other) ×1 IMPLANT
ROD SPINAL CREO TITANIUM STRAIGHT 5.5MM 45MM ×2 IMPLANT
SCREW BONE RELIEVE L4 MM OD2.2 MM SPINE SELF DRILL ×4 IMPLANT
SCREW SPINE CREO AMP 7.5MM 50MM MODULAR ×2 IMPLANT
SCREW SPINE CREO AMP 8.5MM 45MM MODULAR ×2 IMPLANT
SCREW SPINE CREO AMP MOD NON CANNULATED 7.5MM 50MM ×2 IMPLANT
SCREW SPINE CREO AMP MOD NON CANNULATED 8.5MM 45MM ×2 IMPLANT
SCREW SPINE CREO AMP THD POLYAXIAL TULIP COCR 5.5MM ×4 IMPLANT
SPACER SABLE 10MM 22MM 6-12MM 8 DEGREES ×1 IMPLANT
SPACER SPINAL SABLE 22MMX10MMX6 8D ×1 IMPLANT
SUTURE MONOCRYL 4-0 PC-3 18IN UNDYED (Suture) ×1 IMPLANT
SUTURE MONOSOF 2-0 C-15 30IN BLACK (Suture) ×1 IMPLANT
SUTURE POLYSORB 0 GS-21 18IN VIOLET (Suture) IMPLANT
SUTURE POLYSORB 0 GS-21 NDL 18IN UNDYED (Suture) IMPLANT
SUTURE POLYSORB 2 GS-26 30IN VIOLET (Suture) ×1 IMPLANT
SUTURE POLYSORB 2-0 5X18 GS-21 D-TACH UNDYED (Suture) IMPLANT
SUTURE POLYSORB 3-0 V-20 18IN UNDYED (Suture) ×3 IMPLANT
SUTURE STRATAFIX 45CM 1 CT-1 VIOLET (Suture) ×1 IMPLANT
TISSUE FILLER DBX 5ML ×1 IMPLANT
TISSUE FREEZE DRY FILLER DBX PUTTY 5CC ×1 IMPLANT
TOWEL SURG PACK (Towel) ×2 IMPLANT

## 2023-03-16 NOTE — Procedure Nursing Note (Signed)
Called and updated Autumn Camacho on procedure and duration.

## 2023-03-16 NOTE — Anesthesia Procedure Notes (Addendum)
PERIPHERAL IV    Staff:  Performing provider: Darin Engels, MD  Authorizing provider: Diona Fanti, MD    Procedure Indication/Location:  Patient location: Pre-op/PACU  Indication for IV: for care provided by anesthesia    Preparation:  Site prep: alcohol 70%  Skin local anesthetic used: local    Placement:  Laterality: right  Location: forearm  Size: 20 G  Ultrasound Used: to examine potential access site and real-time guidance to enter vessel  Ultrasound image captured: no  Total attempts for all operators: 1    Date and Time Placed:   03/16/2023 7:25 AM

## 2023-03-16 NOTE — Anesthesia Postprocedure Evaluation (Signed)
Patient: Autumn Camacho    Procedure Summary:   Date: 03/16/2023  Room/Location: [facility] MAIN OR 17 / [facility] MAIN OR    Anesthesia Start:  7:44 AM  Anesthesia Stop: 12:07 PM    Procedure(s):  Lumbar 4-5 transforaminal lumbar interbody fusion and Lumbar 4-5 laminectomy  Post-op Diagnosis     * Spinal stenosis of lumbar region with neurogenic claudication [M48.062]    Responsible Provider:   Diona Fanti, MD  ASA Status: 3     Vitals Value Taken Time   BP 127/85 03/16/23 1345   Temp 36.1 C 03/16/23 1200   Pulse 88 03/16/23 1348   SpO2 98 % 03/16/23 1348   Vitals shown include unfiled device data.    Place of evaluation: PACU    Patient participation: patient participated    Level of consciousness: fully conscious    Patient pain control satisfaction: patient is satisfied with level of pain control    Airway patency: patent    Cardiovascular status during assessment: stable    Respiratory status during assessment: breathing comfortably    Anesthetic complications: no    Intravascular volume status assessment: euvolemic    Nausea / vomiting: patient is not experiencing nausea      Planned post-operative disposition at time of assessment: hospital discharge

## 2023-03-16 NOTE — Anesthesia Procedure Notes (Signed)
Airway Placement    Staff:  Performing Provider: Darin Engels, MD  Authorizing Provider: Diona Fanti, MD    Airway management:   Patient location: OR/Procedural area  Final airway type: Endotracheal airway  Intubation reason: General anesthesia/planned procedure    Induction:  Positioning: supine  Preoxygenation: Yes  Mask Ventilation: Grade 1 - Ventilated by mask     Intubation:    Number of Attempts: 1    Final Attempt   Airway Type: ETT  Primary Laryngoscopy: GlideScope  Glidescope Blade Geometry: Mac  Blade Size: 3  Laryngoscopic View: Grade I  ETT Type: standard, cuffed  ETT Route: oral  Size: 7  ETT secured with adhesive tape  Depth at: teeth  (20cm)  Bite Block: handmade cotton & tape - bilateral    Assessment:  Confirmation: waveform capnography and direct visualization  Difficult Airway: no  Procedure Abandoned: no    Date / Time Airway Secured / Re-Secured:  03/16/2023 8:00 AM

## 2023-03-16 NOTE — Op Note (Signed)
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]  Grade 1 spondylolisthesis at L4-5  Grade 1 obesity, BMI 34    Procedure(s) Performed:  L4-5 transforaminal diskectomy and posterior interbody arthrodesis  Insertion of interbody biomechanical device at L4-5  L4-5 posterolateral arthrodesis  L4-5 non-segmental posterior instrumentation  L4-5 bilateral decompressive laminectomy (beyond that required for arthrodesis), facetectomies, and foraminotomies  Use of demineralized bone matrix and BMP as allograft  Use of local bone as autograft  Use of intraoperative neuromonitoring  Use of C-arm fluoroscopy    Surgeons:  Primary: Phylliss Blakes, MD  Fellow - Assisting: Janett Labella, MD    Anesthesia:  Anesthesia type not filed in the log.  EBL: 100 mL   Specimens: None   Wound Class:  Class I/ Clean   Drains:  Closed/Suction Drain 03/16/23 1 Inferior;Lateral;Left Back Accordion (Active)       Urethral Catheter 03/16/23 (Active)   Site Assessment Clean;Skin intact 03/16/23 1208   Collection Container Standard drainage bag 03/16/23 1208   Securement Method Right thigh 03/16/23 1208   Reason for Continuing Urinary Catheterization past POD 1 Frequent UOP measurement for current treatment with no alternative 03/16/23 1208   Output (mL) 900 mL 03/16/23 1100       Implants:  Implant Name Type Inv. Item Serial No. Manufacturer Lot No. LRB No. Used Action   GRAFT BONE INFUSE BMP XS - [id number] Collagen GRAFT BONE INFUSE BMP XS  [employer] [id number] N/A 1 Implanted   TISSUE FILLER DBX - [id number] -  [id number]  TISSUE FILLER DBX [id number] [employer]  N/A 1 Implanted   SPACER SPINAL SABLE [id number] 8D - [id number]  SPACER SPINAL SABLE 22MMX10MMX6 8D  [employer] [id number] N/A 1 Implanted   SCREW SPINE CREO AMP 7.5MM MODULAR - [id number]  SCREW SPINE CREO AMP 7.5MM MODULAR  [employer] TRAY N/A 2 Implanted   SCREW SPINE CREO AMP 8.5MM MODULAR - [id number] Screw SCREW SPINE CREO AMP 8.5MM MODULAR  [employer] TRAY N/A 2 Implanted   SCREW SPINE CREO AMP THD POLYAXIAL [place] COCR 5.5MM - [id number] Screw SCREW SPINE CREO AMP THD POLYAXIAL [place] COCR 5.5MM  [employer] TRAY N/A 4 Implanted   CAP CREO THREADED LOCKING - [id number]  CAP CREO THREADED LOCKING  [employer] TRAY N/A 4 Implanted   4.5 mm Ti Straight Rod Rod   [employer] TRAY N/A 2 Implanted        Indication(s): Autumn Camacho is an 57 year old female who presents with progressively worsening L4 radiculopathy secondary to L4-5 grade 1 spondylolisthesis and central canal stenosis.  Symptoms have been worsening despite conservative management.    Based on these findings, a decompression and fusion was recommended to the patient.    The risks, benefits, and alternatives were discussed at length. The risks of surgery included, but were not limited to: bleeding; infections; spinal cord injury or nerve root injury that could result in bowel/bladder dysfunction, motor weakness, or sensory loss, CSF leaks  that could require further procedures to resolve; poor arthrodesis and/or hardware malfunction that could require further interventions; and then the general risks of undergoing general anesthesia including development coma, cardiopulmonary instability, or even death. The patient voiced understanding and agreed to proceed with surgery.    Finding(s):   Restoration of disc height loss  Correction of spondylolisthesis  Well placed hardware  Adequate  decompression  Adequate hemostasis  Stable neuromonitoring signals throughout the case    Procedure Details:  After appropriately identifying and marking the patient in the preoperative holding area, the patient was brought into the operating room. After a first time out, the patient was intubated uneventfully while carefully maintaining normal cervical alignment. Appropriate lines, foley catheter, sequential compression device boots, and neuromonitoring electrodes were placed by the OR team. A Bair hugger was applied to maintain normothermia. Preoperative prophylactic IV antibiotics were initiated.The patient was then turned into the prone position on an open [place] table.    Intraoperative fluoroscopy was used to locate the operative levels and the planned incision was marked out and prepped and draped in the usual sterile fashion.     The incision was infiltrated with local anesthetic. A skin incision was made with a 10-blade scalpel. A bilateral subperiosteal dissection was performed using the electrocautery device from L3-S1. Intraoperative fluoroscopy confirmed that we were at the correct levels.      A 3D C-arm fluoroscopy spin was performed and was loaded into Stryker frameless, stereotaxic spinal navigation system to be used for frameless stereotactic image guided surgery.  The scan was co-registered to local fiducials within the intraoperative field and accuracy was confirmed to be within 2 mm.    Then with image-guided navigation, hardware was placed from L4 and L5. The appropriate screw entry site and trajectory was confirmed with the navigation system.  The screw diameter and length was also determined with the aid of the navigation system. First, a pilot hole was drilled, then the screw path was cannulated with a navigated awl, a ball-tip probe was used to palpate the tract to confirm no cortical breaches, then an undersized navigated  tap was used, and finally the screw was placed.   This was done  for all screws at all levels.    The following hardware was used:  Globus Creo pedicle screw system  Screws:   L4 bilateral: 7.5 x 50 mm  L5 bilateral: 8.5 x 45 mm  Rods: 5.5 x 45 mm titanium bilateral    The screw positioning was confirmed to be accurate intraoperative fluoroscopy.    Attention was then turned towards the L4-5 decompression.  Under loupe magnification, a L4 bilateral decompressive laminectomy was performed using a combination of a high-speed drill, Leksell rongeur, and Kerrison rongeurs.   Of note there was significant facet arthropathy and ligamentum flavum hypertrophy.  The ligamentum flavum was resected after the laminectomy.  The dura was then exposed and appeared to be well decompressed. Of note, due to the severe stenosis at this level, a bilateral laminectomy was performed for adequate decompression. This decompression exceeded what is necessary to access the disk space for a standard TLIF.  The decompression was extended bilaterally by performing bilateral facetectomy and foraminotomies.  At this point the bilateral L4 nerve roots were well decompressed.    After the decompression, attention was turned toward the diskectomy and interbody arthrodesis.  The pedicle screw based retractor was placed on the right side and distracted to open up the foraminal corridor to access the disc  space.  The L4-5 annulus was incised with a #11 blade. A diskectomy was performed utilizing the pituitary rongeur, Cobb elevators, osteotomes, and curettes. The vertebral bodies were more mobile and the alignment of the vertebral bodies was corrected with the use of bullet shaped paddle distractors.  The endplates were prepped sufficiently for arthrodesis.      A Globus Sable cage measuring 22 x 10 x 6mm (8 degrees of lordosis) was packed with allograft consisting of DBX and BMP to enhance arthrodesis and was inserted into the disk space.  Adequate placement of the cage was confirmed with AP and lateral  fluoroscopy.     Then the rods were placed onto the screws and the L5 cap screws were torqued to the appropriate levels.  Using reduction towers bilaterally, the L4-5 spondylolisthesis was reduced and Screws were placed at L4 to lock the alignment in place.  Next, compression was applied bilaterally to increase the lordosis and then the L4 cap screws were final tightened.    Posterolateral decortication was performed L4-L5 bilaterally with a high speed drill.  Allograft consisting of demineralized bone matrix mixed with local autograft from the laminectomy was placed over the decorticated joints from L4-5 bilaterally for posterolateral arthrodesis.      The wound was copiously irrigated with sterile fluid and meticulous hemostasis was obtained.       A medium hemovac drain was placed into the incision and tunneled through a separate stab wound incision.    The wound was closed in a multilayer fashion and a sterile dressing was applied.     All counts were correct.     The patient was turned to the supine position.    The patient was extubated and transferred to the recovery unit in stable condition.     I participated in the critical portions of the procedures and I was immediately available for the remainder of the procedure.      Complications: None.     Condition: stable

## 2023-03-16 NOTE — Brief Op Note (Signed)
Immediate Brief Operative Note    Jon Rieth - DOB: 1966/09/25 (57 year old female) MRN: [id number]  Procedure Date: 03/16/2023     Location:[facility] OR           PROCEDURE DETAILS    Procedure Team:  Primary: Phylliss Blakes, MD  Fellow - Assisting: Janett Labella, MD     Procedure(s):   Lumbar 4-5 transforaminal lumbar interbody fusion and Lumbar 4-5 laminectomy   Pre Procedure Diagnosis:  Spinal stenosis of lumbar region with neurogenic claudication [M48.062]     Post Procedure Diagnosis:        * Spinal stenosis of lumbar region with neurogenic claudication [M48.062]       PROCEDURE SUMMARY    Anesthesia: Anesthesia type not filed in the log.  Estimated Blood Loss:100 mL     Wound Closure: Primary Closure (Primary Intention)  Wound Class: Procedure(s):  Lumbar 4-5 transforaminal lumbar interbody fusion and Lumbar 4-5 laminectomy - Wound Class: Class I/ Clean     SPECIMENS:   No specimens were documented in this log.    POST OP PLAN       ASSESSMENT & PLAN       Floor q4h neuro checks  Diet: regular  BP goals: SBP 90-160  Na goals: eunatremia  Drains: HV x1   Imaging: CT L sp, XR L sp  Brace: none  Steroids: None  AEDs: None  Antibiotics: Ancef 24 hours  Home meds:Continue  VTE PPX: SCD's only   PT/OT/LSP/Nutrition  Consults:    None anticipated   Pathology: None  Referrals:  None  Suture removal: Expected time for sutures/staples to come out: 10-14 days post-op  Dispo: Floor

## 2023-03-16 NOTE — Procedure Nursing Note (Signed)
Called and updated [name] at 8:40 AM

## 2023-03-16 NOTE — Interval H&P Note (Signed)
The linked note is still valid and up to date; no changes are required.

## 2023-03-16 NOTE — Procedure Nursing Note (Signed)
PACU Summary, Inpatient Transfer to Acute Care or ICU:    Illness severity: Stable: condition holding or improving.   Most significant event:recovery progressing, vitals stable had pain control issues.    Patient summary/synopsis  Level of consciousness: awake, alert, and oriented.  Nausea: None.  Dressing(s) are: clean dry intact.  Pain is present: Yes 9/10. Pain is tolerable or patient has good function: Yes.  Oral fluids taken: Yes.  Precautions or restrictions: Fall.    Action list/to do or watch items:    Neuro checks per orders    Transfer destination: [facility].    Report called to [place], RN on 6 [facility].    Patient transported to 6[facility] via stretcher with all belongings.

## 2023-03-16 NOTE — Progress Notes (Addendum)
57 year old female // [G] RLE numb; L4-5 sten-> L4-5 TLIF [C7-T1 ACDF '11/23 (AA), C7 lamE '11/23 (SE)]    Edited by: Jerel Shepherd, MD at 03/16/2023 0701    POST OP CHECK    - pain improving with PCA  - no sensation of burning numbness in lower extremities, just "tingling", improved compared to preop    Exam:  A&Ox3      R L    LOWER  IP (L2-3) 5 5  Q (L4)  5 5  TA (L4-5) 5 5  G (S1)  5 5  EHL (L5) 5 5     Numbness/tingling BLE      Plan:  Floor q4h neuro checks  Diet: regular  BP goals: SBP 90-160  Na goals: eunatremia  Drains: HV x1  Wound vac x5d  Imaging: CT L sp, XR L sp  Brace: none  Steroids: None  AEDs: None  Antibiotics: Ancef 24 hours  Home meds:Continue  VTE PPX: SCD's only   PT/OT/LSP/Nutrition  Consults:    None anticipated   Pathology: None  Referrals:  None  Suture removal: Expected time for sutures/staples to come out: 10-14 days post-op  Dispo: Floor

## 2023-03-16 NOTE — Anesthesia Procedure Notes (Signed)
PERIPHERAL IV    Staff:  Performing provider: Darin Engels, MD  Authorizing provider: Diona Fanti, MD    Procedure Indication/Location:  Patient location: OR/Procedural area  Indication for IV: for care provided by anesthesia    Preparation:  Site prep: alcohol 70%    Placement:  Laterality: left  Location: foot  Size: 18 G  Total attempts for all operators: 1    Date and Time Placed:   03/16/2023 8:05 AM

## 2023-03-16 NOTE — Progress Notes (Signed)
Discharge Planning Assessment    Visit Information:    Patient is a 79 yrs female admitted on 03/16/2023 for Lumbar spinal stenosis.   Discharge Planning Assessment Type: Discharge Planning Assessment (Face-to-Face)  Is an interpreter used:: Yes  Is this a planned admission: Yes,    Did pt/family decline dc planning evaluation/assistance? No    Living Situation Prior to Admit:  Living Situation Prior to Admission: Home with family/support     Pt reports that they felt their needs were met in living situation: Yes  Home Accessibility Concerns: None  Primary Caregiver: Spouse (Emergency Contact Emergency Contact    HAYTON,CHAD (Spouse)  [phone] (Mobile))  Support System: Spouse/significant other  Physical/Cognitive/Functional History: Physical Limitations  Access to Healthcare Prior to Admit: No/minimal barriers to healthcare  Activities of Daily Living Prior to Admission:  PTA-Independent with ADLs?: Yes  PTA-Memory Adequate to Safely Complete Daily Activities: Yes  PTA- Adequate self-medication management: Yes  PTA-Patient Able to Express Needs/Desires: Yes  PTA-Dressing: Independent  PTA-Feeding: Independent  PTA-Bathing: Needs assistance  PTA-Toileting: Independent  PTA-In/Out Bed: Independent  PTA-Walks in Home: Independent  PTA-Assistive Devices: Walker;Cane;Shower chair;Toilet riser;Wheelchair    Verifications:   Home Address Verified with Patient/LNOK: [address] [place] [address]   Coverage Verified with Patient/LNOK: Primary verified   Insurance Information                  [facility] BLUE CROSS/HERITAGE [employer]US Phone: [phone]    Subscriber: [place], [name] Subscriber#: [id number]    Group#: [id number] Precert#: --        [employer] MEDICARE/[employer] MEDICARE [facility] Phone: [phone]    Subscriber: Margarite, [place] Subscriber#: [id number]    Group#: [id number] Precert#: [id number]            Financial Concerns/Information: No financial issues  PCP Verified with  Patient/LNOK: Yes   Lysle Rubens, MD   Preferred Pharmacy Verified with Patient/LNOKValentino Hue  [facility]     Emergency Contact Verified with Patient/LNOK: Yes   Extended Emergency Contact Information  Primary Emergency Contact: HAYTON,CHAD  Address: [address] [place], [place] [address] [place]  Home Phone: [phone]  Work Phone: [phone]  Mobile Phone: [phone]  Relation: Spouse  Secondary Emergency Contact: NONE  Home Phone: [phone]  Mobile Phone: [phone]  Relation: NONE PROVIDED BY PT    Health Care Decision Making:  Code status: Full Code  POLST: <no information>  Does the patient have a Healthcare DPOA? No   If so, is the paperwork scanned into the chart?  No    Discharge Plan:  EDD:03/19/2023  Patient Discharge Goals were/are: Discussed wi/ family  Patient expects to be discharged to: HOME  Post-discharge facility and service information was:    Anticipated living situation post discharge: Home with family/support  Anticipated Assistance Needed at discharge:    Anticipated Resources/Services Needed at discharge: Home health (PT/OT)  Post Discharge Caregiver Confirmation: Not applicable  Post Discharge Caregiver Contact Information: Isidor Holts (Spouse)  [phone] (Mobile)       Discharge Transportation:  Does the patient need dc transportation arranged?     Transportation Type:      Potential Barriers to Discharge: None at this time  Discharge Complexity Level:2-With supportive/new needs  Initial Discharge Planning Assessment Complete: Yes      Derrill Kay, RN

## 2023-03-16 NOTE — Progress Notes (Signed)
Progress Note     Autumn Camacho") - DOB: Mar 09, 1966 (57 year old female)  Gender Identity: Female  Pronouns: she/her/hers, patient's name  Admit Date: 03/16/2023  Code Status: Full Code       CHIEF CONCERN / IDENTIFICATION:     57 F h/o RA, cervical and lumbar DDD s/p C7-T1 laminectomy, now s/p L4-5 fusion, leminectomy     SUBJECTIVE   INTERVAL HISTORY:  - seen by Dr. Clovis Riley pre-op for hip at [facility] on 12/16/22  - patient underwent total hip 12/22/23 and discharged next day without issues  - Dr. Shanda Bumps team sent referral to [facility] as well for pre/post op medicine consultation.  It was noted that Dr. Clovis Riley saw this patient recently and so repeat clinic visit felt unnecessary and so we are just following post op  - she is managed by pain clinic for chronic pain.  Recently tried to uptitrate her duloxetine from 30--> 60 mg but this made her shaky and have diarrhea  - she saw her rheumatologist 02/10/23.  They appear to be aware of her spine surgery. Per NSGY note on 5/15, they recommended holding leflunomide and cortolizumab 2 wks prior to surgery  - no complications  - dex 10 mg intra-op  - 1.8 L LR  - EBL 100 cc  - BPs kind of soft through the case  - patient was at imaging when I tried to see her so unable to get ros  - husband at bedside--said she was having a lot of surgical site pain but otherwise doing well  - he says he knows her meds pretty well and so we went over them today  - he wonders when she can start her RA meds since hers is pretty severe and so hopign to get back on them quickly      SCHEDULED MEDICATIONS:     acetaminophen, 1,000 mg, q6h [facility]    ceFAZolin, 2 g, q8h    famotidine, 20 mg, q HS    gabapentin, 400 mg, Daily    leflunomide, 20 mg, Daily    polyethylene glycol 3350, 17 g, Daily    senna, 17.2 mg, BID    INFUSED MEDICATIONS:    morphine 5 mg/mL PCA, , PCA **AND** morphine sulfate 5 mg/mL PCA CLINICIAN, 1 mg, q5 min PRN     PRN MEDICATIONS:    calcium carbonate, 1,000 mg, q8h  PRN    diphenhydrAMINE, 25 mg, q6h PRN    melatonin, 3 mg, q HS PRN    methocarbamol, 750 mg, q6h PRN    morphine 5 mg/mL PCA, , PCA **AND** morphine sulfate 5 mg/mL PCA CLINICIAN, 1 mg, q5 min PRN    naloxone, 0.08 mg, q2 min PRN    ondansetron, 4 mg, q8h PRN    ondansetron, 4 mg, q8h PRN    senna, 17.2 mg, BID PRN       OBJECTIVE        T: 36.6 C (03/16/23 1505)  BP: 116/76 (03/16/23 1505)  HR: 90 (03/16/23 1505)  RR: 16 (03/16/23 1505)  SpO2: 95 % (03/16/23 1505) Room air  T range: Temp  Min: 36.1 C  Max: 36.7 C  Admit weight: 83.7 kg (184 lb 9.6 oz) (03/16/23 1513)  Last weight: 83.7 kg (184 lb 9.6 oz) (03/16/23 1513)       I&Os:   Intake/Output Summary (Last 24 hours) at 03/16/2023 1540  Last data filed at 03/16/2023 1430  Intake 1800 ml   Output  2750 ml   Net -950 ml       Physical Exam  Patient was not available    Labs (last 24 hours):   Chemistries  CBC  LFT  Gases, other   - - - 88   -   AST: - ALT: -  -/-/-/-  -/-/-/-   - - -   - >< -  AP: - T bili: -  Lact (a): - Lact (v): -   eGFR: - Ca: -   -   Prot: - Alb: -  Trop I: - D-dimer: -   Mg: - PO4: -  ANC: -     BNP: - Anti-Xa: -     ALC: -    INR: -             ASSESSMENT/PLAN        Summary of recs  - start valacyclovir 1000 mg BID (confirmed with husband she takes this scheduled)  - ok for leflunomide in immediate periop period  - cimizia can be resumed 2 weeks post of if surgical site is healing well, on infectious complications    # RA  - continue leflunomide 20 mg daily  - cimizia can be continued 2 wks post op if wound healing well, no s/sx of infection    # chronic pain:  - gabapentin  - meloxicam    # chronic constipation: senna    # other: valacyclovir    Current Outpatient Medications   Medication Instructions    acetaminophen (TYLENOL) 1,000 mg, Oral, Every 8 hours    amoxicillin 2,000 mg, Oral, Once as needed    Cimzia 2 Syringe 200 mg, Subcutaneous, Every 2 weeks    gabapentin (NEURONTIN) 400 mg, Oral, Daily    leflunomide 20 MG tablet TAKE  1 TABLET DAILY    meloxicam (MOBIC) 7.5-15 mg, Oral, Daily    metroNIDAZOLE 0.75 % cream 1 application, Topical, 2 times daily PRN    Psyllium Husk powder 5.6 g, Oral, Nightly    senna (SENOKOT) 17.2 mg, Oral, 2 times daily PRN    valACYclovir (VALTREX) 1,000 mg, Oral, 2 times daily

## 2023-03-16 NOTE — Nursing Note (Signed)
Patient Summary  61 F h/o RA, cervical and lumbar DDD s/p C7-T1 laminectomy, now s/p L4-5 fusion, laminectomy    A&Ox4. VSS on RA. Pain to lower back managed with morphine PCA 10/24/28. Pt breathes shallowly d/t pain, needing frequent reminders to breathe deeply. Prevena WV to incision site, cont sx . X1 HV with bloody output. Endorsing N/T to BLE at baseline, although improving per pt report. Tolerating reg diet. Foley in place, AUOP. +bt/-flatus. CT scan done this shift.  Edited by: Melvern Banker, RN at 03/16/2023 1847    Illness Severity

## 2023-03-17 LAB — BASIC METABOLIC PANEL
Anion Gap: 9 (ref 4–12)
Calcium: 8.4 mg/dL — ABNORMAL LOW (ref 8.9–10.2)
Carbon Dioxide, Total: 24 meq/L (ref 22–32)
Chloride: 107 meq/L (ref 98–108)
Creatinine: 0.39 mg/dL (ref 0.38–1.02)
Glucose: 113 mg/dL (ref 62–125)
Potassium: 3.6 meq/L (ref 3.6–5.2)
Sodium: 140 meq/L (ref 135–145)
Urea Nitrogen: 9 mg/dL (ref 8–21)
eGFR by CKD-EPI 2021: >60 mL/min/{1.73_m2} (ref >59)

## 2023-03-17 LAB — CBC (HEMOGRAM)
Hematocrit: 32 % — ABNORMAL LOW (ref 36.0–45.0)
Hemoglobin: 10.2 g/dL — ABNORMAL LOW (ref 11.5–15.5)
MCH: 28.2 pg (ref 27.3–33.6)
MCHC: 31.6 g/dL — ABNORMAL LOW (ref 32.2–36.5)
MCV: 89 fL (ref 81–98)
Platelet Count: 225 10*3/uL (ref 150–400)
RBC: 3.62 10*6/uL — ABNORMAL LOW (ref 3.80–5.00)
RDW-CV: 14.9 % — ABNORMAL HIGH (ref 11.0–14.5)
WBC: 12.62 10*3/uL — ABNORMAL HIGH (ref 4.3–10.0)

## 2023-03-17 MED ORDER — VALACYCLOVIR HCL 1 G OR TABS
1000.0000 mg | ORAL_TABLET | Freq: Two times a day (BID) | ORAL | Status: DC
Start: 2023-03-17 — End: 2023-03-19
  Administered 2023-03-17 – 2023-03-19 (×4): 1000 mg via ORAL
  Filled 2023-03-17 (×4): qty 1

## 2023-03-17 MED ORDER — HEPARIN SODIUM (PORCINE) 5000 UNIT/ML IJ SOLN
5000.0000 [IU] | Freq: Three times a day (TID) | INTRAMUSCULAR | Status: DC
Start: 2023-03-17 — End: 2023-03-19
  Administered 2023-03-17 – 2023-03-19 (×5): 5000 [IU] via SUBCUTANEOUS
  Filled 2023-03-17 (×5): qty 1

## 2023-03-17 NOTE — Nursing Note (Signed)
Patient Summary  39 F h/o RA, cervical and lumbar DDD s/p C7-T1 laminectomy, now s/p L4-5 fusion, laminectomy    POD 1: A&Ox4. VSS on 1L NC overnight. Soft Bps, 500cc ns bolus given. Pain to lower back and R leg managed with morphine PCA 10/24/28. Pt breathes shallowly d/t pain, needing frequent reminders to breathe deeply. Prevena WV to incision site, cont sx . X1 HV with bloody output. Endorsing N/T to BLE at baseline, although improving post-op per pt report. Tolerating reg diet. Foley in place, AUOP. +bt/-flatus.   Edited by: Sharene Butters, RN at 03/17/2023 [phone]    Illness Severity    Edited by: Melvern Banker, RN at 03/16/2023 1859      AO x 4, VSS on room air. Pt POD1 for spine surgery, foley removed and pt has already voided naturally in toilet. Worked with OT today, did very well, was able to ambulate with front wheel walker, gait belt, one person assist. Now sitting in chair because she states this is more comfortable for her. Has PCA morphine, pt manages this well, given PRN muscle relaxer as well. Pt has call light readily available, calls appropriately.

## 2023-03-17 NOTE — Progress Notes (Signed)
Physical Therapy Evaluation    Autumn Camacho  [id number]     03/17/2023  Duration of session: 21 Minutes    Patient Active Problem List   Diagnosis    Rheumatoid arthritis involving multiple sites (HCC)    Rheumatoid arthritis (HCC)    Rheumatoid arthritis involving right hand (HCC)    Arthritis of carpometacarpal (CMC) joints of both thumbs    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    Osteoarthritis of right hip    Osteoarthritis of right knee    Knee dislocation, left, initial encounter    Failed total left knee replacement (HCC)    Chronic instability of left knee    Status post revision of total replacement of left knee    Chronic right-sided low back pain with sciatica    Lumbar spinal stenosis    Cervical stenosis of spinal canal    Depressed mood    Rheu arthritis w rheu factor of left hip w/o org/sys involv (HCC)    Recurrent genital HSV (herpes simplex virus) infection    Class 1 obesity due to excess calories with serious comorbidity and body mass index (BMI) of 34.0 to 34.9 in adult    Closed fracture of first thoracic vertebra (HCC)    Loosening of hardware in spine (HCC)    History of total right knee replacement    Diverticulosis    Pharyngeal dysphagia    Lumbar stenosis with neurogenic claudication       Past Medical History:   Diagnosis Date    Chronic right hip pain 02/26/2019    Cervical disc disorder     Chronic diarrhea     attributed to RA meds.    Chronic instability of knee, left knee     Diverticulosis     Moderate diverticulosis, w/out hx of diverticulitis or diverticular bleeding.    Fracture     Bilateral elbow fx    Genital herpes     Genital herpes     on chronic valacyclovir with no flares.    History of low back pain     Motion sickness     Neck pain     d/t " neck instability , loose fusion plate & screws "    Nerve pain     on Duloxetine.    RA (rheumatoid arthritis) (HCC)     Rheumatoid arthritis (HCC)     Seasonal allergies     Spinal stenosis      Tooth disorder     MISSING TOOTH UPPER LEFT BACK-MOLAR    Urinary incontinence     post Cervical fusion "but getting better "       Past Surgical History:   Procedure Laterality Date    CERVICAL FUSION  08/21/2022    9. Emergent C7-T1 anterior cervical discectomy & fusion.    CERVICAL LAMINECTOMY  08/27/2022    C7 laminectomy 08/27/2022.    EPIDURAL BLOCK INJECTION      With joint replacement surgeries    foot Bilateral     Arch surgery    HIP ARTHROPLASTY Right 07/30/2020    HIP ARTHROPLASTY Left 12/22/2022    JOINT REPLACEMENT      Both knees, right hip    KNEE ARTHROPLASTY Left 2018    KNEE ARTHROPLASTY Right 2018    KNEE TOTAL ARTHROPLASTY REVISION Left 05/01/2020    KNEE TOTAL ARTHROPLASTY REVISION Left 04/2018    04/2018 & 04/2020.    LAMINECTOMY  08/27/22  POSTERIOR CERVICAL LAMINECTOMY  08/27/2022    correction : C7 laminectomy and inferior C6 laminotomy @ [facility]    PR ADENOIDECTOMY PRIMARY <AGE 89      AGE 57  T/A     PR TONSILLECTOMY ONE-HALF <AGE 89      PR UNLISTED PROCEDURE FOOT/TOES Bilateral 2009 R,  2010 L    Reconstructive surgery    PR UNLISTED PROCEDURE NECK/THORAX  11/03&09/23    PR UNLISTED PROCEDURE PELVIS/HIP JOINT      DOS 07/30/20 R THA    SPINAL FUSION  08/21/22       Subjective  Patient Report/Self-Assessment: Readily agreeable to PT eval; feels her legs are feeling better since surger         Home Living  Type of Home: Multilevel home  Entry Stairs: 8 with railing  Lives With: Spouse;Daughter  Lives with Comments: daughter lives in basement, has autism, able to assist with ADLs  Assistance Available at Home : 24 hour assistance  Mobility Equipment Owned: Front wheeled walker;Manual or Therapist, art Owned: Grab bars in shower;Built-in shower seat;Hand-held shower head;Grab bars around toilet    Prior Level of Function : Limited household ambulator with FWW. Has been walking about 20 ft at a time. MWC for community, husband pushes her d/t RA in  hands (unable to propel with UEs). Difficulty standing upright d/t pain. Hx of cervical surgery in November          Objective  RUE Assessment  RUE Assessment Comments: RA in fingers, limited ROM and strength but able to hold walker                        LUE Assessment  LUE Assessment Comments: RA in fingers, limited ROM and strength but able to hold walker                        RLE Assessment  RLE Assessment: Within Functional Limits  RLE Assessment Comments: limited ankle ROM, everted and protated in WB                        LLE Assessment  LLE Assessment: Within Functional Limits  LLE Assessment Comments: limited ankle ROM, everted and protated in WB                        Sensation  Sensation Comments : Numb in portion of LUE                    Static Sitting Balance   Static Sitting Balance Level of Assistance: Independent    Dynamic Sitting Balance  Dynamic Sitting Balance Level of Assistance: Supervision / Stand by assistance    Static Standing Balance  Static Standing Balance Comments: FWW  Static Standing Balance Level of Assistance: Supervision / Stand by assistance  Dynamic Standing Balance  Dynamic Standing Balance Comments: FWW  Dynamic Standing Balance Level of Assistance: Supervision / Stand by assistance    Activity Tolerance  Endurance: Tolerates 10 - 20 min of activity with multiple rests      Functional Level:  Bed Mobility  Sit to Supine: Contact guard assist (touching/steadying assistance);Log Roll;Head of Bed Elevated  Scooting: Supervision / Stand by assistance    Transfers  Sit<>Stand: Supervision / Stand by assistance;Assistive device used  Sit<>Stand Assistive Device Details: Front wheeled walker  Ambulation  Distance: 150 ft  Assistance Needs: Supervision / Stand by assistance  Assistive Device Used: Front wheeled walker  Gait quality/descriptors: Narrow BOS;Flexed posture;Decreased foot clearance;Decreased step length                Interventions:  Therex:                             Theract: Review of spinal precautions and functional applications, progressive daily activity goals, fall prevention, PT POC inpatient.     Log roll with bed rail and HOB raised to 20 deg, CGA for LEs but able to scoot self up in bed while on side without assist.     Amb with FWW and CGA progressing to SBA. Narrow BOS and dec foot clearance d/t chronic foot positioning (everted & pronated with limited DF)              Balance/Neuro Re-ed:                             Gait Training:                                           ASSESSMENT/PLAN     Tommy presents s/p L-spine surgery with anticipated post op precautions and decreased mobility. She is able to complete bed mobility with light guiding assist and hospital features. She requires no physical assist for transfers and 150 ft ambulation with FWW. She is highly motivated to make progress with mobility. She would benefit from one additional PT session to progress amb and trial stairs needed for dc home. Anticipate no barriers to return home with supportive spouse once medically ready.      Barriers to Discharge: none anticipated           PT Discharge Equipment: FWW (owns)       Goals:  Goal: Bed mobility, Independent  Estimated Completion Date: 03/30/23   Goal: Transfer with AD, Amb with AD, Independent  Estimated Completion Date: 03/30/23   Goal: Stairs, Supervision / Stand by assistance  Estimated Completion Date: 03/30/23          PT Plan of Care:  Treatment/Interventions: Therapeutic functional activities, Chief Strategy Officer, Patient/family training     PT Plan: Skilled PT     PT Dosage: 3-5x/week     Plan for Next Session: progress gait and upright mobility with FWW, trial stairs, review HEP at dc      Other Recommendations:  Recommendation for further PT: Outpatient PT   PT Daily Recommendations: amb in halls 3x daily with FWW and 1PA    Maurine Cane, PT

## 2023-03-17 NOTE — Progress Notes (Signed)
Progress Note     Autumn Camacho") - DOB: [birth year] (57 year old female)  Gender Identity: Female  Pronouns: she/her/hers, patient's name  Admit Date: 03/16/2023  Code Status: Full Code       CHIEF CONCERN / IDENTIFICATION:     89 F h/o RA, cervical and lumbar DDD s/p C7-T1 laminectomy, now s/p L4-5 fusion, leminectomy     SUBJECTIVE   INTERVAL HISTORY:  - feeling well today  - reports that the PCA is helpful but wears off when she is sleeping so she wakes up in pain  - no N/V, eating breakfast without difficulty  - no SOB or CP  - feels like her breathing is easier when sitting up because her back pain is better in this position      SCHEDULED MEDICATIONS:     acetaminophen, 1,000 mg, q6h [facility]    famotidine, 20 mg, q HS    gabapentin, 400 mg, Daily    heparin, 5,000 units, q8h [facility]    leflunomide, 20 mg, Daily    polyethylene glycol 3350, 17 g, Daily    senna, 17.2 mg, BID    valACYclovir, 1,000 mg, BID    INFUSED MEDICATIONS:    morphine 5 mg/mL PCA, , PCA **AND** morphine sulfate 5 mg/mL PCA CLINICIAN, 1 mg, q5 min PRN     PRN MEDICATIONS:    calcium carbonate, 1,000 mg, q8h PRN    diphenhydrAMINE, 25 mg, q6h PRN    melatonin, 3 mg, q HS PRN    methocarbamol, 750 mg, q6h PRN    morphine 5 mg/mL PCA, , PCA **AND** morphine sulfate 5 mg/mL PCA CLINICIAN, 1 mg, q5 min PRN    naloxone, 0.08 mg, q2 min PRN    ondansetron, 4 mg, q8h PRN    ondansetron, 4 mg, q8h PRN    senna, 17.2 mg, BID PRN       OBJECTIVE        T: 36.5 C (03/17/23 0916)  BP: (!) 105/54 (03/17/23 0930)  HR: (!) 116 (03/17/23 0930)  RR: 16 (03/17/23 0916)  SpO2: 92 % (03/17/23 0930) Room air  T range: Temp  Min: 35.6 C  Max: 36.7 C  Admit weight: 83.7 kg (184 lb 9.6 oz) (03/16/23 1513)  Last weight: 83.7 kg (184 lb 9.6 oz) (03/16/23 1513)       I&Os:   Intake/Output Summary (Last 24 hours) at 03/17/2023 1216  Last data filed at 03/16/2023 2116  Intake --   Output 2190 ml   Net -2190 ml       Physical Exam  GEN - sitting in chair,  NAD  HEENT - MMM  RESP - CTAB, no WRR  CV - RRR, no MRG, no LEE  AB - SNTND    Labs (last 24 hours):   Chemistries  CBC  LFT  Gases, other   140 107 9 113   10.2   AST: - ALT: -  -/-/-/-  -/-/-/-   3.6 24 0.39   12.62 >< 225  AP: - T bili: -  Lact (a): - Lact (v): -   eGFR: >60 Ca: 8.4   32   Prot: - Alb: -  Trop I: - D-dimer: -   Mg: - PO4: -  ANC: -     BNP: - Anti-Xa: -     ALC: -    INR: -             ASSESSMENT/PLAN  Summary of recs  - ok for leflunomide in immediate periop period  - cimizia can be resumed 2 weeks post of if surgical site is healing well, on infectious complications  - meloxicam can be resumed when the surgical team feels it is safe from a bleeding perspective    --------------------------------------------------------  By problem:    # RA  - continue leflunomide 20 mg daily  - cimizia can be continued 2 wks post op if wound healing well, no s/sx of infection  - meloxicam can be resumed when the surgical team feels it is safe from a bleeding perspective    # chronic pain:  - gabapentin  - meloxicam can be resumed when the surgical team feels it is safe from a bleeding perspective  - acute pain management per primary team    # chronic constipation: senna    # other: valacyclovir    Current Outpatient Medications   Medication Instructions    acetaminophen (TYLENOL) 1,000 mg, Oral, Every 8 hours    amoxicillin 2,000 mg, Oral, Once as needed    Cimzia 2 Syringe 200 mg, Subcutaneous, Every 2 weeks    gabapentin (NEURONTIN) 400 mg, Oral, Daily    leflunomide 20 MG tablet TAKE 1 TABLET DAILY    meloxicam (MOBIC) 7.5-15 mg, Oral, Daily    metroNIDAZOLE 0.75 % cream 1 application, Topical, 2 times daily PRN    Psyllium Husk powder 5.6 g, Oral, Nightly    senna (SENOKOT) 17.2 mg, Oral, 2 times daily PRN    valACYclovir (VALTREX) 1,000 mg, Oral, 2 times daily

## 2023-03-17 NOTE — Nursing Note (Signed)
Patient Summary  64 F h/o RA, cervical and lumbar DDD s/p C7-T1 laminectomy, now s/p L4-5 fusion, laminectomy    POD 1: A&Ox4. VSS on 1L NC overnight. Soft Bps, 500cc ns bolus given. Pain to lower back and R leg managed with morphine PCA 10/24/28. Pt breathes shallowly d/t pain, needing frequent reminders to breathe deeply. Prevena WV to incision site, cont sx . X1 HV with bloody output. Endorsing N/T to BLE at baseline, although improving post-op per pt report. Tolerating reg diet. Foley in place, AUOP. +bt/-flatus.       Illness Severity  Stable

## 2023-03-17 NOTE — Consults (Incomplete)
Consult Note     Lelon Frohlich Kem [name]") - DOB: July 31, 1966 (57 year old female)  Gender Identity: Female  Pronouns: she/her/hers, patient's name  Admit Date: 03/16/2023  Code Status: Full Code       {Vanishing Tip  To link this note to the consult order, right click "Consults" below and click "Edit SmartBlock" :999}  Consults    CHIEF CONCERN / IDENTIFICATION:     Kanna Karwoski is a 57 year old female with ***     SUBJECTIVE   HISTORY OF PRESENT ILLNESS:   ***    Current Pain Complaints:  {location:114588}    Patient Reported Outcomes:   {patient reported outcomes:114580}    Treatment Provided at Time of Assessment:  {treatment provided:114605}    Patient Reported Treatment Related Side Effects:  {treatment related side effects:114602}      ALLERGIES: {Vanishing Tip  Update/review allergies :999}  Infliximab, Adhesives, Cymbalta [duloxetine hcl], Codeine, Cyanoacrylate, Fentanyl, Hydromorphone, and Nortriptyline    {Vanishing Tip  ROS no longer required for billing.  Document clinically relevant history in HPI. For optional smartblock type .ROSBYAGE :999}      {Anticoagulation:114739}  {Fall history:114740}    {Choose which history format you would NWGN:562130}    Past Pain/Substance Use History:  {Alcohol use:114646}    {Notable Past and Present Opioid Related Events:114653::"***"}     [place] Medication Dispense History (from 03/22/2022 to 03/16/2023)     Dispensed Days Supply Quantity   OXYCODONE HCL (IR) 5 MG TABLET 01/29/2023 5 40 unspecified   OXYCODONE HCL (IR) 5 MG TABLET 01/18/2023 5 40 unspecified   OXYCODONE HCL (IR) 5 MG TABLET 01/01/2023 4 42 unspecified   OXYCODONE HCL (IR) 5 MG TABLET 12/29/2022 4 42 unspecified   OXYCODONE HCL (IR) 5 MG TABLET 12/23/2022 3 42 unspecified   OXYCODONE HCL (IR) 5 MG TABLET 10/10/2022 15 30 unspecified   OXYCODONE HCL (IR) 5 MG TABLET 09/30/2022 11 42 unspecified   OXYCODONE HCL (IR) 5 MG TABLET 09/21/2022 4 30 unspecified   OXYCODONE HCL (IR) 5 MG  TABLET 09/14/2022 4 30 unspecified   OXYCODONE HCL (IR) 5 MG TABLET 09/09/2022 4 30 unspecified   OXYCODONE HCL (IR) 5 MG TABLET 08/31/2022 4 30 unspecified   OXYCODONE HCL (IR) 5 MG TABLET 08/23/2022 5 42 unspecified   DIAZEPAM 5 MG TABLET 07/01/2022 4 8 unspecified          OBJECTIVE     {Vanishing Tip  Summary  Graph  Flowsheet  :999}   T: 36.7 C (03/16/23 0640)  BP: 134/90 (03/16/23 0640)  HR: 91 (03/16/23 0640)  RR: (!) 13 (03/16/23 1200)  SpO2: 97 % (03/16/23 0640) Room air   Vitals (Most recent in last 24 hrs)   T: 36.5 C (03/17/23 0916)  BP: (!) 105/54 (03/17/23 0930)  HR: (!) 116 (03/17/23 0930)  RR: 16 (03/17/23 0916)  SpO2: 92 % (03/17/23 0930) Room air  T range: Temp  Min: 35.6 C  Max: 36.7 C  Wt 184 lb 9.6 oz (83.734 kg)     Ht 5\' 1"  (1.549 m)     Body mass index is 34.88 kg/m.       Physical Exam  ***  {Tubes/Catheters:114595}    {Strength and mobility (optional):114741}    Labs (last 24 hours): {Vanishing Tip  Labs Since Admission  Glycemic Control  :999}  Chemistries  CBC  LFT  Gases, other   140 107 9 113   10.2  AST: - ALT: -  -/-/-/-  -/-/-/-   3.6 24 0.39   12.62 >< 225  AP: - T bili: -  Lact (a): - Lact (v): -   eGFR: >60 Ca: 8.4   32   Prot: - Alb: -  Trop I: - D-dimer: -   Mg: - PO4: -  ANC: -     BNP: - Anti-Xa: -     ALC: -    INR: -        {Data Review (.DATAREVIEWIP)  :114283}        ASSESSMENT/PLAN    Pain Care Impression: ***  {Today's ZOXW:960454}  {Discussed Treatment:114600}  {Discussed UJWJ:191478}  {Future Pain Management:114599}

## 2023-03-17 NOTE — Progress Notes (Signed)
NEUROSURGERY PROGRESS NOTE    57 year old female // [G] RLE numb; L4-5 sten-> L4-5 TLIF [C7-T1 ACDF '11/23 (AA), C7 lamE '11/23 (SE)]    Edited by: Jerel Shepherd, MD at 03/16/2023 0701    - POD1  - Hypotensive overnight, received 500cc bolus with improvement      Exam:  A&Ox3                               R           L     LOWER  IP (L2-3) 5 5  Q (L4)                5           5  TA (L4-5) 5 5  G (S1)                5           5  EHL (L5) 5 5      Numbness/tingling BLE    Labs:  Lab Results   Component Value Date    SODIUM 140 03/17/2023    SODIUM 140 03/03/2023    WBC 12.62 03/17/2023    WBC 6.86 03/03/2023    HEMATOCRIT 32 03/17/2023    HEMATOCRIT 44 03/03/2023    PLATELET 225 03/17/2023    PLATELET 285 03/03/2023    INR 0.9 03/03/2023    INR 1.0 08/25/2022       Imaging:  CT L sp: reviewed, appropraite post op hardware placement    Plan:  Floor q4h neuro checks  Diet: regular  BP goals: SBP 90-160  Na goals: eunatremia  Drains: HV x1  Wound vac x5d (2/5)  Imaging:  XR L sp  Brace: none  Steroids: None  AEDs: None  Antibiotics: Ancef 24 hours  Home meds:Continue  VTE PPX: SCD's only   SQH tonight 5/29  PT/OT/LSP/Nutrition  Consults:    None anticipated   Pathology: None  Referrals:  None  Ok to remove foley today  Suture removal: Expected time for sutures/staples to come out: 10-14 days post-op  Dispo: Floor

## 2023-03-17 NOTE — Nursing Note (Signed)
Patient Summary  51 F h/o RA, cervical and lumbar DDD s/p C7-T1 laminectomy, now s/p L4-5 fusion, laminectomy    POD 1: A&Ox4. VSS on 1L NC while sleeping. Pain to lower back and R leg managed with morphine PCA 10/24/28. Prevena WV to incision site, cont sx . X1 HV with bloody output. Endorsing N/T to BLE at baseline, although improving post-op per pt report. Tolerating reg diet. Foley removed, AUOP. +bt/-flatus.   Edited by: Einar Grad, RN at 03/17/2023 1712    Illness Severity    Edited by: Melvern Banker, RN at 03/16/2023 [phone]

## 2023-03-17 NOTE — Progress Notes (Signed)
Occupational Therapy Note   Patient Name: Autumn Camacho  MRN: [id number]  Total Treatment time: 95 Minutes      Home Living: Type of Home: Multilevel home (splitlevel)  Entry Stairs: 8 with railing  Lives With: Spouse, Daughter  Lives with Comments: daughter lives in basement, has autism, able to assist with ADLs  Assistance Available at Home : 24 hour assistance  Bathroom Shower/Tub: Pension scheme manager: ADA height  Mobility Equipment Owned: Front wheeled walker, Manual or electric lift, Single point Engineer, water Owned: Grab bars in shower, Built-in shower seat, Hand-held shower head, Grab bars around toilet  Home Living Comments: has a reacher, sock aid, and leg lifter. Considering purchasing an adjustable bed but currently        Prior Level of Function: BADLs: Requires assist (lower body dressing and toileting)  IADLs: Requires assist  Functional Mobility:  (independent with FWW in her home, assist with MWC in community)      Upper Extremities: RUE Assessment: Within Functional Limits  RUE Assessment Comments: baseline RA changes         LUE Assessment: Within Functional Limits  LUE Assessment Comments: baseline RA changes        Hand Function: Hand Function Assessment: Impaired  Hand Function Comments: decreased grip strength at baseline 2/2 RA, windswept fingers            Sensation: Right lower extremity: Impaired  Left lower extremity: Impaired  Sensation comments: reports LE sensation is improving since surgery, R worse than L at baseline               Cognition: Overall Cognitive Status: Within Functional Limits         Subjective: "I can't believe how much better my leg feels"         Current Level of Function     Eating:  Independent            Grooming/Oral Hygiene:  Supervision / Stand by assistance;Position- standing,      Supervision / Stand by assistance;Position-standing        Upper Body Dressing: Minimum Assist (less than 25% help needed)             Footwear  Dressing:  Dependent (greater than 75% help needed)   has assist and long handled tools at baseline     Toileting: Supervision / Stand by assistance   cues given for hygiene within precautions, BSC over toilet, able to wipe without twisting      Bathing:     Reviewed safety and incision care for seated bathing       Toilet/Commode Transfer:                      Supervision / Stand by assistance                 BSC over toilet for added height          Therapeutic Interventions     Functional Transfers         Sit>Stand: Supervision / Stand by assistance;Assistive device used Verbal;Tactile Front wheeled walker                        Bed<>Chair/WC: Supervision / Stand by assistance Verbal              4" foam cushion in seat        Bed Mobility:  Supine>Sit: Contact guard assist (touching/steadying assistance)  Bed Mobility Comments: cued for log roll technique - pt performed modified version with HOB elevated     Functional Mobility:   Supervision / Stand by assistance 100'                 FWW, slow pace, slightly flexed posture, cued for standing inside walker     ,   Balance:         Sitting:            Static:              Dynamic:   Independent                  Independent                        Standing:            Static:              Dynamic:   Supervision / Stand by assistance               FWW  Supervision / Stand by assistance               FWW    ,   ADL/IADL Comments:  Reviewed role of OT, goals, spinal precautions and implications for ADLs/iADLs. Pt familiar with precautions from previous surgery. Demonstrated allowable twist and bend limits. Reviewed activity goals for today and strategies to increase OOB time.          Assessment     Assessment:  Autumn Camacho did great on first time OOB. She was normotensive and reported significant post-op improvement in LE sensation (still mild sensation impairment in RLE). Pt was able to complete ADLs close to baseline level - needs assist with LB dressing but completed  toileting and standing tasks at sink with just supervision. Autumn Camacho has 24/7 assist available and all needed bathroom DME. OT to do 1 quick check in with pt prior to d/c but anticipate she will meet all OT goals without further intervention.     Session Tolerance:   Patient tolerated treatment well     Plan     Plan: Skilled OT   Treatment Interventions: Self-care/Home management, Caregiver education/training, Therapeutic activities       Coverage for Next Shift: ok to d/c, review shower training if needed     Discharge Recommendations     Recommendations: No further OT needed   Barriers to Discharge: none anticipated   Assistance with:  IADLs, ADLs        Equipment Recommended:    none needed      Goals      1. Goal: Grooming, Upper body dressing, Independent  Estimated Completion Date: 03/29/23  Goal Status: Progressing       2. Goal: Pt will complete toileting tasks independently while adhering to spinal precautions  Estimated Completion Date: 03/29/23  Goal Status: Progressing       Carole Civil, OT

## 2023-03-17 NOTE — Telephone Encounter (Signed)
Spoke with patients husbands, acquired the dates for his leave.

## 2023-03-18 ENCOUNTER — Inpatient Hospital Stay (HOSPITAL_COMMUNITY): Payer: No Typology Code available for payment source

## 2023-03-18 DIAGNOSIS — M48061 Spinal stenosis, lumbar region without neurogenic claudication: Secondary | ICD-10-CM

## 2023-03-18 DIAGNOSIS — R0902 Hypoxemia: Secondary | ICD-10-CM

## 2023-03-18 LAB — CBC (HEMOGRAM)
Hematocrit: 30 % — ABNORMAL LOW (ref 36.0–45.0)
Hemoglobin: 9.1 g/dL — ABNORMAL LOW (ref 11.5–15.5)
MCH: 27.7 pg (ref 27.3–33.6)
MCHC: 30.6 g/dL — ABNORMAL LOW (ref 32.2–36.5)
MCV: 90 fL (ref 81–98)
Platelet Count: 188 10*3/uL (ref 150–400)
RBC: 3.29 10*6/uL — ABNORMAL LOW (ref 3.80–5.00)
RDW-CV: 14.9 % — ABNORMAL HIGH (ref 11.0–14.5)
WBC: 9.33 10*3/uL (ref 4.3–10.0)

## 2023-03-18 LAB — BASIC METABOLIC PANEL
Anion Gap: 7 (ref 4–12)
Calcium: 7.8 mg/dL — ABNORMAL LOW (ref 8.9–10.2)
Carbon Dioxide, Total: 25 meq/L (ref 22–32)
Chloride: 109 meq/L — ABNORMAL HIGH (ref 98–108)
Creatinine: 0.34 mg/dL — ABNORMAL LOW (ref 0.38–1.02)
Glucose: 98 mg/dL (ref 62–125)
Potassium: 3.7 meq/L (ref 3.6–5.2)
Sodium: 141 meq/L (ref 135–145)
Urea Nitrogen: 10 mg/dL (ref 8–21)
eGFR by CKD-EPI 2021: >60 mL/min/{1.73_m2} (ref >59)

## 2023-03-18 MED ORDER — OXYCODONE HCL 5 MG OR TABS
5.0000 mg | ORAL_TABLET | ORAL | Status: DC | PRN
Start: 2023-03-18 — End: 2023-03-19
  Administered 2023-03-18 (×3): 10 mg via ORAL
  Administered 2023-03-18 (×2): 5 mg via ORAL
  Administered 2023-03-19: 10 mg via ORAL
  Administered 2023-03-19 (×2): 15 mg via ORAL
  Filled 2023-03-18: qty 2
  Filled 2023-03-18: qty 1
  Filled 2023-03-18: qty 2
  Filled 2023-03-18: qty 1
  Filled 2023-03-18: qty 2
  Filled 2023-03-18 (×2): qty 1
  Filled 2023-03-18 (×3): qty 3

## 2023-03-18 MED ORDER — HYDROMORPHONE HCL 1 MG/ML IJ SOLN
0.2000 mg | INTRAMUSCULAR | Status: DC | PRN
Start: 2023-03-18 — End: 2023-03-19

## 2023-03-18 MED ORDER — METHOCARBAMOL 750 MG OR TABS
750.0000 mg | ORAL_TABLET | Freq: Four times a day (QID) | ORAL | Status: DC
Start: 2023-03-18 — End: 2023-03-19
  Administered 2023-03-18 – 2023-03-19 (×5): 750 mg via ORAL
  Filled 2023-03-18 (×5): qty 1

## 2023-03-18 NOTE — Progress Notes (Signed)
Physical Therapy Treatment    Autumn Camacho  [id number]     03/18/2023  Duration of session: 23 Minutes    Subjective  Patient Report/Self-Assessment: agreeable to PT         Home Living  Type of Home: Multilevel home  Entry Stairs: 8 with railing  Lives With: Spouse, Daughter  Lives with Comments: daughter lives in basement, has autism, able to assist with ADLs  Assistance Available at Home : 24 hour assistance  Mobility Equipment Owned: Front wheeled walker, Manual or electric lift, Single point cane  Durable Medical Equipment Owned: Grab bars in shower, Built-in shower seat, Hand-held shower head, Grab bars around toilet    Functional Level:  Bed Mobility  Sit to Supine: Contact guard assist (touching/steadying assistance);Log Roll;Head of Bed Elevated  Scooting: Supervision / Stand by assistance    Transfers  Sit<>Stand: Supervision / Stand by assistance;Assistive device used  Sit<>Stand Assistive Device Details: Front wheeled walker  Stand <> Sit: Supervision / Stand by [employer] device used  Stand [employer] Device Details: Front wheeled Financial controller  Distance: 150'  Assistance Needs: Supervision / Stand by Surveyor, minerals Used: Front wheeled walker  Gait quality/descriptors: Trendelenberg;Circumduction;Narrow BOS;Decreased foot clearance  Ambulation Comment: pronation and eversion of bilateral ankles                Interventions:  Therex:                            Theract: Ambulation 150' with FWW and SBA. Trendelenberg gait with slight circumduction, also noticing excessive pronation and eversion of bilateral ankles     4 stairs with 2 railings. Cues for going up on stronger leg and down with weaker one     Discussed HH PT for after d/c for follow up.              Balance/Neuro Re-ed:                             Gait Training:                                           ASSESSMENT/PLAN     Autumn Camacho has progessed well s/p spine surgery. 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 She was able to perform 4 stairs with SBA using 2 railings for assist. Cues for asending with stronger leg and desceding with weaker leg, also advising spouse to help on stairs temporarily at home. Autumn Camacho has no further barriers for d/c, recommending HH PT.      Barriers to Discharge: none anticipated           PT Discharge Equipment: FWW (owns)       Goals:  Goal: Bed mobility, Independent  Estimated Completion Date: 03/30/23  Goal Status: Adequate for discharge   Goal: Transfer with AD, Amb with AD, Independent  Estimated Completion Date: 03/30/23  Goal Status: Adequate for discharge   Goal: Stairs, Supervision / Stand by assistance  Estimated Completion Date: 03/30/23  Goal Status: Met          PT Plan of Care:  Treatment/Interventions: Therapeutic functional activities, Gait/Stairs training     PT Plan: No skilled PT/Discharge PT     PT Dosage: 3-5x/week     Plan for Next Session: progress gait and upright mobility with FWW, trial stairs, review HEP at dc      Other Recommendations:  Recommendation for further PT: Outpatient PT   PT Daily Recommendations: amb in halls 3x daily with FWW and 1PA    Larrie Kass, PTS

## 2023-03-18 NOTE — Progress Notes (Signed)
NEUROSURGERY PROGRESS NOTE    57 year old female // [G] RLE numb; L4-5 sten-> L4-5 TLIF [C7-T1 ACDF '11/23 (AA), C7 lamE '11/23 (SE)]    Edited by: Jerel Shepherd, MD at 03/16/2023 0701    - Continues to be asymptomatically hypotensive  - Otherwise doing well, cleared therapies yesterday      Exam:  A&Ox3                               R           L     LOWER  IP (L2-3) 5 5  Q (L4)                5           5  TA (L4-5) 5 5  G (S1)                5           5  EHL (L5) 5 5      Numbness/tingling BLE    Labs:  Lab Results   Component Value Date    SODIUM 141 03/18/2023    SODIUM 140 03/17/2023    WBC 9.33 03/18/2023    WBC 12.62 03/17/2023    HEMATOCRIT 30 03/18/2023    HEMATOCRIT 32 03/17/2023    PLATELET 188 03/18/2023    PLATELET 225 03/17/2023    INR 0.9 03/03/2023    INR 1.0 08/25/2022       Imaging:  CT L sp: reviewed, appropraite post op hardware placement    Plan:  Floor q4h neuro checks  Diet: regular  BP goals: SBP 90-160  Na goals: eunatremia  Drains: HV x1  Wound vac x5d (2/5)  Imaging:  XR L sp  Please obtain today  Brace: none  Steroids: None  AEDs: None  Antibiotics: Ancef 24 hours  Home meds:Continue  VTE PPX: SCD's only   SQH tonight 5/29  PT/OT/LSP/Nutrition  Consults:    None anticipated   Pathology: None  Referrals:  None  Ok to remove foley today  Suture removal: Expected time for sutures/staples to come out: 10-14 days post-op  Dispo: Floor  Likeyl d/c home pending uprights, drain, transition to PO pain meds

## 2023-03-18 NOTE — Nursing Note (Signed)
Patient Summary  24 F with PMH RA, cervical and lumbar DDD s/p C7-T1 laminectomy, now s/p L4-5 fusion, laminectomy     POD 2. A&Ox4. VSS on RA during day, 2L NC while sleeping. Transitioned to PO oxy, tolerating well. Upright XR done. Prevena WV. HVx1 with serosang output. Endorsing N/T to BLE at baseline. Reg diet. Ambulated in halls x3. Possible d/c and drain removal tomorrow. LBM 5/28, +tones/flatus      Illness Severity  Stable

## 2023-03-18 NOTE — Nursing Note (Signed)
69 F with PMH RA, cervical and lumbar DDD s/p C7-T1 laminectomy, now s/p L4-5 fusion, laminectomy    POD 2. A&Ox4. Soft BP, MD aware, pt asymptomatic. RA during day, 2L NC while sleeping. Pain to lower back and R leg managed with morphine PCA 10/24/28. Prevena WV to incision site, cont sx . HVx1 with serosang output. Endorsing N/T to BLE at baseline. Tolerating reg diet. AUOP.

## 2023-03-18 NOTE — Progress Notes (Signed)
Progress Note     Autumn Camacho") - DOB: 04-Feb-1966 (57 year old female)  Gender Identity: Female  Pronouns: she/her/hers, patient's name  Admit Date: 03/16/2023  Code Status: Full Code       CHIEF CONCERN / IDENTIFICATION:     89 F h/o RA, cervical and lumbar DDD s/p C7-T1 laminectomy, now s/p L4-5 fusion, leminectomy     SUBJECTIVE   INTERVAL HISTORY:  - required some O2 overnight, none this AM, no SOB or DOE  - went for a walk and did stairs with PT today, her pain is much better  - no SOB or lightheadedness today  - no N/V  - hopeful to go home today but drain output is still too high per surgery team      SCHEDULED MEDICATIONS:     acetaminophen, 1,000 mg, q6h [facility]    famotidine, 20 mg, q HS    gabapentin, 400 mg, Daily    heparin, 5,000 units, q8h [facility]    leflunomide, 20 mg, Daily    methocarbamol, 750 mg, q6h [facility]    polyethylene glycol 3350, 17 g, Daily    senna, 17.2 mg, BID    valACYclovir, 1,000 mg, BID    INFUSED MEDICATIONS:       PRN MEDICATIONS:    calcium carbonate, 1,000 mg, q8h PRN    diphenhydrAMINE, 25 mg, q6h PRN    HYDROmorphone, 0.2-0.4 mg, q4h PRN    melatonin, 3 mg, q HS PRN    ondansetron, 4 mg, q8h PRN    ondansetron, 4 mg, q8h PRN    oxyCODONE, 5-15 mg, q4h PRN    senna, 17.2 mg, BID PRN       OBJECTIVE        T: 36 C (03/18/23 0923)  BP: 101/76 (03/18/23 0923)  HR: (!) 108 (03/18/23 0923)  RR: 16 (03/18/23 0923)  SpO2: 95 % (03/18/23 0923) Room air  T range: Temp  Min: 36 C  Max: 36.6 C  Admit weight: 83.7 kg (184 lb 9.6 oz) (03/16/23 1513)  Last weight: 83.7 kg (184 lb 9.6 oz) (03/16/23 1513)       I&Os:   Intake/Output Summary (Last 24 hours) at 03/18/2023 1045  Last data filed at 03/18/2023 1610  Intake --   Output 265 ml   Net -265 ml       Physical Exam  GEN - lying in bed, NAD  HEENT - MMM  RESP - CTAB, no WRR  CV - RRR, no MRG, no LEE  AB - SNTND    Labs (last 24 hours):   Chemistries  CBC  LFT  Gases, other   141 109 10 98   9.1   AST: - ALT: -  -/-/-/-  -/-/-/-    3.7 25 0.34   9.33 >< 188  AP: - T bili: -  Lact (a): - Lact (v): -   eGFR: >60 Ca: 7.8   30   Prot: - Alb: -  Trop I: - D-dimer: -   Mg: - PO4: -  ANC: -     BNP: - Anti-Xa: -     ALC: -    INR: -             ASSESSMENT/PLAN        Summary of recs  - no concerns re: discharge from medicine perspective  - does have some nocturnal desats, encourage mobilization and IS 10x/h while awake, decrease opiates as tolerated  By problem:    # Nocturnal hypoxia - likely related to opiate pain medication and splinting (she reports it is more painful to take full breaths while lying down)  - continue with excellent pulmonary toilet  - decrease opiates as tolerated    # RA  - continue leflunomide 20 mg daily  - cimizia can be continued 2 wks post op if wound healing well, no s/sx of infection  - meloxicam can be resumed when the surgical team feels it is safe from a bleeding perspective    # chronic pain:  - gabapentin  - meloxicam can be resumed when the surgical team feels it is safe from a bleeding perspective  - acute pain management per primary team    # chronic constipation: senna    # other: valacyclovir    Current Outpatient Medications   Medication Instructions    acetaminophen (TYLENOL) 1,000 mg, Oral, Every 8 hours    amoxicillin 2,000 mg, Oral, Once as needed    Cimzia 2 Syringe 200 mg, Subcutaneous, Every 2 weeks    gabapentin (NEURONTIN) 400 mg, Oral, Daily    leflunomide 20 MG tablet TAKE 1 TABLET DAILY    meloxicam (MOBIC) 7.5-15 mg, Oral, Daily    metroNIDAZOLE 0.75 % cream 1 application, Topical, 2 times daily PRN    Psyllium Husk powder 5.6 g, Oral, Nightly    senna (SENOKOT) 17.2 mg, Oral, 2 times daily PRN    valACYclovir (VALTREX) 1,000 mg, Oral, 2 times daily

## 2023-03-19 ENCOUNTER — Other Ambulatory Visit (HOSPITAL_BASED_OUTPATIENT_CLINIC_OR_DEPARTMENT_OTHER): Payer: Self-pay

## 2023-03-19 DIAGNOSIS — Z48811 Encounter for surgical aftercare following surgery on the nervous system: Secondary | ICD-10-CM

## 2023-03-19 LAB — CBC (HEMOGRAM)
Hematocrit: 30 % — ABNORMAL LOW (ref 36.0–45.0)
Hemoglobin: 9.4 g/dL — ABNORMAL LOW (ref 11.5–15.5)
MCH: 28.6 pg (ref 27.3–33.6)
MCHC: 31.3 g/dL — ABNORMAL LOW (ref 32.2–36.5)
MCV: 91 fL (ref 81–98)
Platelet Count: 196 10*3/uL (ref 150–400)
RBC: 3.29 10*6/uL — ABNORMAL LOW (ref 3.80–5.00)
RDW-CV: 15.1 % — ABNORMAL HIGH (ref 11.0–14.5)
WBC: 8.4 10*3/uL (ref 4.3–10.0)

## 2023-03-19 LAB — BASIC METABOLIC PANEL
Anion Gap: 9 (ref 4–12)
Calcium: 7.9 mg/dL — ABNORMAL LOW (ref 8.9–10.2)
Carbon Dioxide, Total: 25 meq/L (ref 22–32)
Chloride: 105 meq/L (ref 98–108)
Creatinine: 0.44 mg/dL (ref 0.38–1.02)
Glucose: 103 mg/dL (ref 62–125)
Potassium: 3.6 meq/L (ref 3.6–5.2)
Sodium: 139 meq/L (ref 135–145)
Urea Nitrogen: 7 mg/dL — ABNORMAL LOW (ref 8–21)
eGFR by CKD-EPI 2021: >60 mL/min/{1.73_m2} (ref >59)

## 2023-03-19 MED ORDER — POLYETHYLENE GLYCOL 3350 17 GM/SCOOP OR POWD
17.0000 g | Freq: Every day | ORAL | 0 refills | Status: DC | PRN
Start: 2023-03-19 — End: 2023-04-09
  Filled 2023-03-19: qty 238, 14d supply, fill #0

## 2023-03-19 MED ORDER — ACETAMINOPHEN 500 MG OR TABS
1000.0000 mg | ORAL_TABLET | Freq: Four times a day (QID) | ORAL | 0 refills | Status: AC | PRN
Start: 2023-03-19 — End: ?
  Filled 2023-03-19: qty 60, 8d supply, fill #0

## 2023-03-19 MED ORDER — ONDANSETRON HCL 4 MG OR TABS
4.0000 mg | ORAL_TABLET | Freq: Three times a day (TID) | ORAL | 0 refills | Status: DC | PRN
Start: 2023-03-19 — End: 2023-04-09
  Filled 2023-03-19: qty 9, 3d supply, fill #0

## 2023-03-19 MED ORDER — METHOCARBAMOL 750 MG OR TABS
750.0000 mg | ORAL_TABLET | Freq: Four times a day (QID) | ORAL | 0 refills | Status: DC | PRN
Start: 2023-03-19 — End: 2023-04-09
  Filled 2023-03-19: qty 60, 15d supply, fill #0

## 2023-03-19 MED ORDER — SENNOSIDES 8.6 MG OR TABS
8.6000 mg | ORAL_TABLET | Freq: Two times a day (BID) | ORAL | 0 refills | Status: DC | PRN
Start: 2023-03-19 — End: 2023-04-30
  Filled 2023-03-19: qty 20, 10d supply, fill #0

## 2023-03-19 MED ORDER — OXYCODONE HCL 5 MG OR TABS
5.0000 mg | ORAL_TABLET | ORAL | 0 refills | Status: DC | PRN
Start: 2023-03-19 — End: 2023-03-22
  Filled 2023-03-19: qty 42, 7d supply, fill #0

## 2023-03-19 NOTE — Home Health Face To Face (Signed)
Date of Face to Face Encounter: 03/19/2023    This Face to Face Encounter was in whole, or in part, for the following medical condition(s) which is the primary reason for home health care:  Medical Condition(s): (M48.061) Spinal stenosis of lumbar region, unspecified whether neurogenic claudication present  (primary encounter diagnosis)    I certify that, based on my findings, the following services are medically necessary for home health services: Physical Therapy    My clinical findings that support the need for the above services are: patient s/p Lumbar 4-5 transforaminal lumbar interbody fusion and Lumbar 4-5 laminectomy . Patient is below baseline with functional mobility, ADLs, and requires assistance to leave the home.     I certify that this patient is homebound (.i.e. absences from require considerable and taxing effort and are for medical reasons or religious services and are infrequent or of short duration) because: patient requires service of others for functional mobility, ADLs, and assistance to leave the home.     The [facility] (CMS) requires the hospitalist/attending signing the Face to Face Encounter documentation to identify the community physician in the discharge plan for home health care (the provider assuming ongoing management of the Home Health Plan of Care).    Primary Care Physician: Lysle Rubens, MD    Attending Name: Phylliss Blakes  03/19/2023 at 7:05 AM

## 2023-03-19 NOTE — Nursing Note (Signed)
Patient Summary  41 F with PMH RA, cervical and lumbar DDD s/p C7-T1 laminectomy, now s/p L4-5 fusion, laminectomy     POD 2. A&Ox4. VSS on RA. Pain managed with PO oxy, tolerating well.  Prevena W[place]. HVx1 with serosang output. Endorsing N/Tto BLE at baseline that has improve since surgery.  LBM 5/31, +tones/flatus. 1PA to the bathroom. AUOP.    Drain and [facility] removed by NSGY at bedside.    Reviewed AVS, IV x2 removed. Pt downstairs with husband to retrieve meds.    Illness Severity  Stable

## 2023-03-19 NOTE — Hospital Course (Signed)
Per Dr. Shanda Bumps op note 03/16/2023 "Autumn Camacho is an 57 year old female who presents with progressively worsening L4 radiculopathy secondary to L4-5 grade 1 spondylolisthesis and central canal stenosis.  Symptoms have been worsening despite conservative management.     Based on these findings, a decompression and fusion was recommended to the patient.     The risks, benefits, and alternatives were discussed at length. The risks of surgery included, but were not limited to: bleeding; infections; spinal cord injury or nerve root injury that could result in bowel/bladder dysfunction, motor weakness, or sensory loss, CSF leaks that could require further procedures to resolve; poor arthrodesis and/or hardware malfunction that could require further interventions; and then the general risks of undergoing general anesthesia including development coma, cardiopulmonary instability, or even death. The patient voiced understanding and agreed to proceed with surgery."    Patient underwent Lumbar 4-5 transforaminal lumbar interbody fusion and Lumbar 4-5 laminectomy , with Dr. Nelson Chimes on 03/16/2023.    Patient underwent above procedure and tolerated procedure and anesthesia well. They were admitted to the acute NSGY floor   overnight for monitoring. Remained neurologically and hemodynamically stable overnight. POD1, mobilized well with  PT and worked with OT. At that time It was recommended that  patient be discharged with [facility] PT.    Pain was controlled with oral medications, tolerated oral intake    Met medical criteria for discharge home.      Has follow up scheduled with neurosurgery team on 04/05/2023 for wound check.

## 2023-03-19 NOTE — Progress Notes (Addendum)
[facility] NEUROLOGICAL SURGERY  Progress Note Date of Service: 03/19/23   Date of Admission: 03/16/2023  Hospital Day: 3  Post Op Date: 3 Days Post-Op      IDENTIFICATION  57 year old female // [G] RLE numb; L4-5 sten-> L4-5 TLIF [C7-T1 ACDF '11/23 (AA), C7 lamE '11/23 (SE)]    Edited by: Jerel Shepherd, MD at 03/16/2023 0701       INTERVAL EVENTS   NAEON  Cleared PT yesterday recommending HH PT       OBJECTIVE   EXAM  Alert and conversant  FS     Lando Alcalde R L   IP 5 5   Q 5 5   TA 5 5   G 5 5   EHL  5 5     Numbness/tingling BLE     LABS  Lab Results   Component Value Date    SODIUM 139 03/19/2023    SODIUM 141 03/18/2023    WBC 8.40 03/19/2023    WBC 9.33 03/18/2023    HEMATOCRIT 30 03/19/2023    HEMATOCRIT 30 03/18/2023    PLATELET 196 03/19/2023    PLATELET 188 03/18/2023    INR 0.9 03/03/2023    INR 1.0 08/25/2022        ASSESSMENT & PLAN       Floor q4h neuro checks  Diet: regular  BP goals: SBP 90-160  Na goals: eunatremia  Drains: HV x1  Wound vac x5d (4/5)  Plan to remove today prior to DC  Imaging:  XR L sp completed and reviewed  Brace: none  Steroids: None  AEDs: None  Antibiotics: Ancef 24 hours  Home meds:Continue  VTE PPX: SCD's only and SQH   PT/OT/LSP/Nutrition  Consults:    None anticipated   Pathology: None  Referrals:  None  Suture removal: Expected time for sutures/staples to come out: 10-14 days post-op scheduled for 6/17.  Dispo: Floor  Plan for Dc today with HH PT.    Signed   Holli Humbles ARNP

## 2023-03-19 NOTE — Nursing Note (Signed)
Patient Summary  20 F with PMH RA, cervical and lumbar DDD s/p C7-T1 laminectomy, now s/p L4-5 fusion, laminectomy     POD 2. A&Ox4. VSS on RA. Pain managed with PO oxy, tolerating well.  Prevena WV. HVx1 with serosang output. Endorsing N/T to BLE at baseline that has improved since surgery. Possible d/c and drain removal 5/31. LBM 5/31, +tones/flatus. 1PA to the bathroom. AUOP.  Edited by: Robbi Garter, RN at 03/19/2023 [phone]    Illness Severity  Stable  Edited by: Bebe Liter, RN at 03/18/2023 [phone]

## 2023-03-19 NOTE — DC Planning Supplement (Signed)
Discharge Orders - Home Health       The information contained in this report is accurate as of the printing date/time shown at the bottom of this page. Refer to the After Visit Summary (AVS) for final information.          Comments           [facility] INPATIENT  [address]  [place] [address] Autumn Camacho  MRN: [id number], DOB: 1966/04/21, Legal Sex: F  Adm: 03/16/2023, D/C: 03/19/2023     [facility] ML INPATIENT  [address]  [place] [address] Autumn Camacho  MRN: [id number], DOB: Nov 27, 1965, Legal Sex: F  Adm: 03/16/2023, D/C: 03/19/2023     Patient Demographics       Address  [address] [place] [address] Phone  [phone] (Home)  [phone] (Mobile) *Preferred* E-mail Address  [email or web address]             Admission Orders        Admit to Inpatient      Electronically signed by: Janett Labella, MD on 03/16/23 1141 Status: Completed   Ordering user: Janett Labella, MD 03/16/23 1141 Ordering provider: Janett Labella, MD   Ordering mode: Standard   Cosigning events  Electronically cosigned by Phylliss Blakes, MD 03/16/23 1158 for Ordering   Frequency: Once 03/16/23 1140 - 1  occurrence      Updates       Attending provider: Phylliss Blakes, MD Diagnosis: 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 [place] [address]    Phone: [phone] Fax: [phone]    Hours: Mon-Fri 8:00am - 9:00pm, Sat-Sun 8:00am - 8:00pm, Please allow 48 hours for refills.    [facility] [phone] [address] [place] [phone] [address]     [address] [place] [address]    Phone: [phone] Fax: [phone]    Hours: Not open 24 hours    [facility] DELIVERY [address] [place] [phone]     [address] [place] [address]    Phone: [phone] Fax: [phone]    Hours: Not open 24 hours    [facility] [address] [facility] [place] [phone]     [address] [facility] [place] [address]    Phone: [phone] Fax: [phone]    Hours: Not open 24 hours    [facility]    [address] [place] [address]    Phone: [phone] Fax: [phone]    Hours: Mon-Fri 8:00am - 8:00pm, Sat-Sun 9:00am - 5:00pm, Please allow 48 hours for refills.  Discharge Medications        New Medications        Sig Disp Refill Start End         methocarbamol 750 MG tablet  Signed by: Burr Medico  Commonly known as: Robaxin   750 mg, Oral, Every 6 hours PRN   60 tablet   0          ondansetron 4 MG tablet  Signed by: Burr Medico  Commonly known as: Zofran   4 mg, Oral, Every 8 hours PRN   12 tablet   0          oxyCODONE 5 MG tablet  Signed by: Burr Medico   Take 1 tablet (5 mg) by mouth every 4 hours as needed for moderate or severe pain.   42 tablet   0          polyethylene glycol 3350 17 GM/SCOOP oral powder  Signed by: Burr Medico  Commonly known as: Miralax   Fill cap with powder to 17g line and dissolve in 4 to 8oz of water. Drink 1 time a day as needed for constipation.   238 g   0                 Modified Medications        Sig Disp Refill Start End         Acetaminophen Extra Strength 500 MG tablet  Signed by: Burr Medico  What changed:   when to take this  reasons to take this   1,000 mg, Oral, Every 6 hours PRN   60 tablet   0          Senna-Time 8.6 MG tablet  Signed by: Burr Medico  Generic drug: senna  What changed: how much to take   8.6 mg, Oral, 2 times daily PRN   20 tablet   0                 Medications To Continue         Sig Disp Refill Start End         Cimzia 2 Syringe 200 MG/ML prefilled syringe kit  Signed by: Doug Sou  Generic drug: certolizumab   200 mg, Subcutaneous, Every 2 weeks   1 each   2          gabapentin 100 MG capsule  Commonly known as: Neurontin   400 mg, Oral, Daily    0          leflunomide 20 MG tablet  Signed by: Doug Sou  Commonly known as: Arava   TAKE 1 TABLET DAILY   90 tablet   0          metroNIDAZOLE 0.75 % cream  Commonly known as: Metrocream   1 application, Topical, 2 times daily PRN    0          Psyllium Husk powder   5.6 g, Oral, Nightly    0          valACYclovir 1 g tablet  Commonly known as: Valtrex   1,000 mg, Oral, 2 times daily    0                 Stopped Medications      amoxicillin 500 MG capsule     meloxicam 7.5 MG tablet  Commonly known as: Mobic  Home Health Referral Order (From admission, onward)       Ordered     Start    03/19/23 0704  REFERRAL TO HOME HEALTH SERVICES        Comments: Clinical information:  57 year old year old female with (M48.061) Spinal stenosis of lumbar region, unspecified whether neurogenic claudication present  (primary encounter diagnosis)    Primary Services Requested to Evaluate and Treat:  Physical Therapy    Ancillary Services Requested to Evaluate and Treat (only allowed if Nursing, PT, or ST are ordered):  None   Electronically Signed By: Burr Medico, ARNP [NPI: [id number]]    03/19/23 0000                     Home Health Face to Face Note        Home Health Face To Face by Burr Medico, ARNP at 03/19/2023  7:05 AM  Version 1 of 1      Author: Burr Medico, ARNP Service: Neurosurgery Author Type: Nurse Practitioner    Filed: 03/19/2023  7:07 AM Date of Service: 03/19/2023  7:05 AM Status: Signed    Editor: Burr Medico, ARNP (Nurse Practitioner)         Date of Face to Face Encounter: 03/19/2023    This Face to Face Encounter was in whole, or in part, for the following medical condition(s) which is the primary reason  for home health care:  Medical Condition(s): (M48.061) Spinal stenosis of lumbar region, unspecified whether neurogenic claudication present  (primary encounter diagnosis)    I certify that, based on my findings, the following services are medically necessary for home health services: Physical Therapy    My clinical findings that support the need for the above services are: patient s/p Lumbar 4-5 transforaminal lumbar interbody fusion and Lumbar 4-5 laminectomy . Patient is below baseline with functional mobility, ADLs, and requires assistance to leave the home.     I certify that this patient is homebound (.i.e. absences from require considerable and taxing effort and are for medical reasons or religious services and are infrequent or of short duration) because: patient requires service of others for functional mobility, ADLs, and assistance to leave the home.     The [facility] Services (CMS) requires the hospitalist/attending signing the Face to Face Encounter documentation to identify the community physician in the discharge plan for home health care (the provider assuming ongoing management of the Home Health Plan of Care).    Primary Care Physician: Lysle Rubens, MD    Attending Name: Phylliss Blakes  03/19/2023 at 7:05 AM              Electronically signed by Burr Medico, ARNP at 03/19/2023  7:07 AM               Discharge Instructions         Neurosurgery follow up:  You are scheduled with the neurosurgery team on 04/05/2023 for wound check and post-op follow up. At that time we will check to see how you are recovering.    INSTRUCTIONS FOR AFTER YOUR SPINAL FUSION SURGERY:  --No NSAIDs (Ibuprofen, Aleve, Advil, Naproxen, Diclofenac, Motrin, etc) for 6 weeks after surgery  as NSAIDS may delay or inhibit fusion rates. Post operative pain is multifactorial. For sharp deep surgical site pain the best medications to use is the Tylenol and then the narcotic if the Tylenol is not enough. For the  tight/achy/pressure type pain from muscle tension the best medication to use is the muscle relaxant. For the electric/tingling/fire nerve type pain there are nerve pain medication that are specific for this type of pain that you aren't often discharged with.  --Call for any redness, discharge or opening from your wound  --Call for increased swelling around your wound.  --Ok to shower 48 hours after surgery, use regular shampoo, avoid active scrubbing of the surgical incision  --Do not soak in a bathtub/pools/hot tub until 6-8 weeks postoperatively or cleared by neurosurgery provider. Your incision will be checked by the doctor or at your first visit, 2 weeks after your surgery.   --Keep incision open to air, do not apply any ointments   --Expect to have new nerve pain in a similar distribution following surgery. This pain should greatly improve over the first 2-3 weeks. Please call clinic with concerns  --If you were discharged in a brace you must wear when upright for 6 weeks post op. You do not need to wear your brace while lying or sleeping. You may remove for short periods for showering.     Activity:   --You are restricted from heavy lifting for 3 months post op.   Month 1: Do not lift, pull, push objects weighing over 10 pounds  Month 2: Do not lift, pull, push objects weighing over 20 pounds  Month 3: Do not lift, pull, push objects weighing over 30 pounds  --Frequent walking is encouraged  --Avoid repetitive bending or twisting of spine. It is recommended you bend at the knees instead. Log Roll" in and out of bed.  --It may be most comfortable to alternate sitting, walking, and reclining  --Avoid high impact activities (running, contact sports, etc.) until cleared by Neurosurgery  --You may drive once off narcotic pain medications    Contact the Neurosurgery team if you develop any of the following:   Severe or unusual headache    Nausea and vomiting    Temperature over 101.5    Problems with balance or  falls    Purulent discharge from wound    Stroke-like symptoms such as facial drop, weakness, numbness.    New extremity weakness    Loss of bowel and bladder control    Personality changes, confusion, memory problems, seizures     Call the Clinic Monday-Friday during business hours at  [phone] After business hours, weekends or holidays call [phone]          Scheduled Appointments      Apr 05, 2023 1:15 PM  (Arrive by 1:00 PM)  Post-op with Federico Flake, ARNP  [facility]) [address] [place], [place] [address]  [place] [address]  [phone]      Please arrive early to allow time to complete any required paperwork.     Apr 09, 2023 9:30 AM  (Arrive by 9:15 AM)  Post-op with Erick Alley, MD  [facility] [place]) [address]  [place] [address]  [phone]      Please arrive early to allow time to complete any required paperwork.     Apr 13, 2023 10:00 AM  (Arrive by 9:45 AM)  New Patient Appointment with Lenward Chancellor, MD  [facility])  Arrive at: [facility] Building - 6th Floor [facility]) [address]  [place] [address]  [phone]  Please arrive early to allow time to complete any required paperwork.     Jun 16, 2023 1:00 PM  (Arrive by 12:45 PM)  Post-op with Phylliss Blakes, MD  [facility]) [address] [place], [place] [address]  [place] [address]  [phone]      Please arrive early to allow time to complete any required paperwork.          PCP  Lysle Rubens, MD  Phone: [phone]  Fax: [phone]  NPI ID: [id number]    Attending at time of discharge: Phylliss Blakes, MD [[id number]]      Printed by Reino Kent, Social Work Assistant 03/19/2023  4:41 PM Page 0 of 0   Printed by Reino Kent, Social Work Assistant 03/19/2023  4:41 PM  Page 0 of 0

## 2023-03-19 NOTE — Progress Notes (Signed)
Occupational Therapy Note   Patient Name: Autumn Camacho  MRN: [id number]  Total Treatment time: 8 Minutes      Subjective: "I am so ready to go home"         Current Level of Function     Eating:  Independent                Upper Body Dressing: Independent         Lower Body Dressing: Independent         Footwear Dressing:  Independent                             Therapeutic Interventions     Functional Transfers         Sit>Stand: Independent                           ,   Balance:         Sitting:            Static:              Dynamic:   Independent                  Independent                        Standing:            Static:              Dynamic:   Independent                  Independent               FWW    ,   ADL/IADL Comments:  1 cue given for spinal precaution to avoid twisting, pt otherwise able to follow precautions independently.          Assessment     Assessment:  Autumn Camacho is able to complete ADLs at baseline level. She has met all OT goals, no further OT needs.     Session Tolerance:   Patient tolerated treatment well     Plan     Plan: Skilled OT   Treatment Interventions: Self-care/Home management, Caregiver education/training, Therapeutic activities   Frequency:     Coverage for Next Shift: ok to d/c, review shower training if needed     Discharge Recommendations     Recommendations: No further OT needed   Barriers to Discharge: none   Assistance with:  IADLs, ADLs        Equipment Recommended:    none needed      Goals      1. Goal: Grooming, Upper body dressing, Independent  Estimated Completion Date: 03/29/23  Goal Status: Met       2. Goal: Pt will complete toileting tasks independently while adhering to spinal precautions  Estimated Completion Date: 03/29/23  Goal Status: Met       Carole Civil, OT

## 2023-03-19 NOTE — Progress Notes (Addendum)
Social Work Progress Note:    Current Issue:SWA receives request from Hoyle Barr, MSW to initiate [facility] referral    Action Taken:SWA sent home health referral via [facility] to [facility]     Plan/Follow up: SWA to follow for acceptance and DC paperwork    UPDATE: Now accepted with [facility] 6/4, all DC paperwork has been completed      Gweneth Dimitri  Social Work Geophysicist/field seismologist II  [facility] Social Work and [employer]

## 2023-03-19 NOTE — Discharge Summary (Signed)
Discharge Summary     [facility] Autumn Camacho") - DOB: 31-Mar-1966 (57 year old female)  Gender Identity: Female  Pronouns: she/her/hers, patient's name  PCP: Lysle Rubens, MD   Code Status: Full Code        DATE OF ADMISSION: 03/16/2023  DATE OF DISCHARGE: 03/19/2023  DISCHARGE TEAM & ATTENDING: Neurosurgery & Phylliss Blakes, MD     ADMISSION DIAGNOSIS: Lumbar spinal stenosis  DISCHARGE DIAGNOSIS: Lumbar spinal stenosis    PROBLEMS ADDRESSED DURING THIS HOSPITALIZATION:   Principal Problem:    Lumbar spinal stenosis  Active Problems:    Lumbar stenosis with neurogenic claudication  Resolved Problems:    * No resolved hospital problems. *      DISCHARGE FOLLOW-UP VISITS/APPOINTMENTS:    Upcoming appointments at [facility] Medicine:  Future Appointments   Date Time Provider Department Center   04/05/2023  1:15 PM Federico Flake, ARNP UNEUSG [facility] NEUROSU   04/09/2023  9:30 AM Manner, Marton Redwood, MD [facility]   04/13/2023 10:00 AM Lenward Chancellor, MD HF&AI20 [facility] ORTHO   06/16/2023  1:00 PM Phylliss Blakes, MD [facility] NEUROSU        Additional follow-up:  No follow-up provider specified.    PENDING RESULTS THAT REQUIRE FOLLOW-UP (as of this summary):  Pending Labs       No pending labs            DIAGNOSTIC STUDIES RECOMMENDED:  none    INCIDENTAL FINDINGS THAT REQUIRE FOLLOW-UP:       None      THERAPEUTIC RECOMMENDATIONS:   Neurosurgery follow up:  You are scheduled with the neurosurgery team on 04/05/2023 for wound check and post-op follow up. At that time we will check to see how you are recovering.    INSTRUCTIONS FOR AFTER YOUR SPINAL FUSION SURGERY:  --No NSAIDs (Ibuprofen, Aleve, Advil, Naproxen, Diclofenac, Motrin, etc) for 6 weeks after surgery  as NSAIDS may delay or inhibit fusion rates. Post operative pain is multifactorial. For sharp deep surgical site pain the best medications to use is the Tylenol and then the narcotic if the Tylenol is not enough. For the  tight/achy/pressure type pain from muscle tension the best medication to use is the muscle relaxant. For the electric/tingling/fire nerve type pain there are nerve pain medication that are specific for this type of pain that you aren't often discharged with.  --Call for any redness, discharge or opening from your wound  --Call for increased swelling around your wound.  --Ok to shower 48 hours after surgery, use regular shampoo, avoid active scrubbing of the surgical incision  --Do not soak in a bathtub/pools/hot tub until 6-8 weeks postoperatively or cleared by neurosurgery provider. Your incision will be checked by the doctor or at your first visit, 2 weeks after your surgery.   --Keep incision open to air, do not apply any ointments   --Expect to have new nerve pain in a similar distribution following surgery. This pain should greatly improve over the first 2-3 weeks. Please call clinic with concerns  --If you were discharged in a brace you must wear when upright for 6 weeks post op. You do not need to wear your brace while lying or sleeping. You may remove for short periods for showering.     Activity:   --You are restricted from heavy lifting for 3 months post op.   Month 1: Do not lift, pull, push objects weighing over 10 pounds  Month 2: Do  not lift, pull, push objects weighing over 20 pounds  Month 3: Do not lift, pull, push objects weighing over 30 pounds  --Frequent walking is encouraged  --Avoid repetitive bending or twisting of spine. It is recommended you bend at the knees instead. Log Roll" in and out of bed.  --It may be most comfortable to alternate sitting, walking, and reclining  --Avoid high impact activities (running, contact sports, etc.) until cleared by Neurosurgery  --You may drive once off narcotic pain medications    Contact the Neurosurgery team if you develop any of the following:   Severe or unusual headache    Nausea and vomiting    Temperature over 101.5    Problems with balance or  falls    Purulent discharge from wound    Stroke-like symptoms such as facial drop, weakness, numbness.    New extremity weakness    Loss of bowel and bladder control    Personality changes, confusion, memory problems, seizures     Call the Clinic Monday-Friday during business hours at  [phone] After business hours, weekends or holidays call [phone]    ALLERGIES:  Infliximab, Adhesives, Cymbalta [duloxetine hcl], Codeine, Cyanoacrylate, Fentanyl, Hydromorphone, and Nortriptyline      DISCHARGE MEDICATIONS:   Discharge Medication List as of 03/19/2023 11:52 AM        START taking these medications    Details   methocarbamol 750 MG tablet Take 1 tablet (750 mg) by mouth every 6 hours as needed for muscle spasms.Disp-60 tablet, R-0, Normal      ondansetron 4 MG tablet Take 1 tablet (4 mg) by mouth every 8 hours as needed for nausea/vomiting.Disp-12 tablet, R-0, Normal      oxyCODONE 5 MG tablet Take 1 tablet (5 mg) by mouth every 4 hours as needed for moderate or severe pain.Disp-42 tablet, R-0, Normal      polyethylene glycol 3350 17 GM/SCOOP oral powder Fill cap with powder to 17g line and dissolve in 4 to 8oz of water. Drink 1 time a day as needed for constipation.Disp-238 g, R-0, Normal           CONTINUE these medications which have CHANGED    Details   acetaminophen 500 MG tablet Take 2 tablets (1,000 mg) by mouth every 6 hours as needed.Disp-60 tablet, R-0, Normal      senna 8.6 MG tablet Take 1 tablet (8.6 mg) by mouth 2 times a day as needed for constipation.Disp-20 tablet, R-0, Normal           CONTINUE these medications which have NOT CHANGED    Details   certolizumab (Cimzia 2 Syringe) 200 MG/ML prefilled syringe kit Inject 200 mg under the skin every 2 weeks.Disp-1 each, R-2, Normal      gabapentin 100 MG capsule Take 4 capsules (400 mg) by mouth daily.Historical Med      leflunomide 20 MG tablet TAKE 1 TABLET DAILYDisp-90 tablet, R-0, Normal      metroNIDAZOLE 0.75 % cream Apply 1  application topically 2 times a day as needed.Historical Med      Psyllium Husk powder Take 5.6 g by mouth at bedtime.Historical Med      valACYclovir 1 g tablet Take 1 tablet (1,000 mg) by mouth 2 times a day.Historical Med           STOP taking these medications       amoxicillin 500 MG capsule Comments:   Reason for Stopping:         meloxicam 7.5  MG tablet Comments:   Reason for Stopping:               BRIEF ADMISSION HISTORY:       HOSPITAL COURSE:   Per Dr. 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  Symptoms have been worsening despite conservative management.     Based on these findings, a decompression and fusion was recommended to the patient.     The risks, benefits, and alternatives were discussed at length. The risks of surgery included, but were not limited to: bleeding; infections; spinal cord injury or nerve root injury that could result in bowel/bladder dysfunction, motor weakness, or sensory loss, CSF leaks that could require further procedures to resolve; poor arthrodesis and/or hardware malfunction that could require further interventions; and then the general risks of undergoing general anesthesia including development coma, cardiopulmonary instability, or even death. The patient voiced understanding and agreed to proceed with surgery."    Patient underwent Lumbar 4-5 transforaminal lumbar interbody fusion and Lumbar 4-5 laminectomy , with Dr. Nelson Chimes on 03/16/2023.    Patient underwent above procedure and tolerated procedure and anesthesia well. They were admitted to the acute NSGY floor   overnight for monitoring. Remained neurologically and hemodynamically stable overnight. POD1, mobilized well with  PT and worked with OT. At that time It was recommended that  patient be discharged with [facility] PT.    Pain was controlled with oral  medications, tolerated oral intake    Met medical criteria for discharge home.      Has follow up scheduled with neurosurgery team on 04/05/2023 for wound check.         DISPOSITION:    06 HOME HEALTH CARE [06]    CONDITION: stable     CONSULTS COMPLETED:    None     OPERATIONS/PROCEDURES:  Surgical/Procedural Cases on this Admission       Case IDs Date Procedure Surgeon Location Status    [id number] 03/16/23 Lumbar 4-5 transforaminal lumbar interbody fusion and Lumbar 4-5 laminectomy Nelson Chimes, Carolin Coy, MD [facility] MAIN OR Comp            Additional procedures:  None     PRINCIPAL DIAGNOSTIC STUDIES/RESULTS:    XR L Spine 2-3 View   Final Result by Lanette Hampshire, MD (05/30 1030)   Status post L4-5 PSIF and TLIF with intact hardware. Mild rightward curvature centered at L2 level with slight retrolisthesis of L2 on L3. Alignment is otherwise preserved. Partially visualized bilateral hip joint arthroplasties.      CT L Spine wo Contrast   Final Result by Lanette Hampshire, MD (05/28 1727)   1. Expected postsurgical changes of an L4-5 TLIF. No evidence of hardware failure.      This is a preliminary report dictated by Dr. Arlana Hove (PGY-6).       Unless an Attending Final Impression appears below, the report has not yet been finalized and an attending radiologist has not reviewed these images.          ATTENDING FINAL REPORT      I agree with the preliminary report.          I have personally reviewed the images and agree with the report (or as edited).      XR L Spine 2-3 Vw   Final Result by Louann Liv, MD (05/28 1325)   17  intraoperative fluoroscopic radiographs and fluoroscopic CT demonstrate ongoing TLIF and PSIF at the L4-5 level. Please see operative report for further details.      Preliminary report by neuroradiology fellow.  Unless a final report appears below the images have not been reviewed by an attending radiologist.          ATTENDING FINAL REVIEW      I agree with the preliminary report.                        I have personally reviewed the images and agree with the report (or as edited).      XR Fluoro Up To 1 Hr   Final Result by Huey Romans, Silentschedule (05/29 0735)      Intraoperative Monitoring   Final Result by Patric Dykes, PhD (05/29 1450)            Discharge Orders   Referral to Home Health Services   Standing Status: Future   Referral Priority: Routine Referral Type: Specialty Visit   Number of Visits Requested: 1       DISCHARGE PHYSICAL EXAM:   Vitals (Most recent in last 24 hrs)     T: 36.3 C (03/19/23 1031)  BP: 115/72 (03/19/23 1121)  HR: (!) 111 (03/19/23 1044)  RR: 16 (03/19/23 1031)  SpO2: 93 % (03/19/23 1031) Room air  T range: Temp  Min: 36.1 C  Max: 36.8 C  Admit weight: 83.7 kg (184 lb 9.6 oz) (03/16/23 1513)  Last weight: 83.7 kg (184 lb 9.6 oz) (03/16/23 1513)       Physical Exam  Alert and conversant  FS     Marlaya Turck R L   IP 5 5   Q 5 5   TA 5 5   G 5 5   EHL  5 5      Numbness/tingling BLE    Surgical incision CDI     Medicine physicians mentioned in this note can be reached by calling the [facility] Medicine Paging Operator at [phone]. If any part of this transcript is missing or to request other transcripts for this patient call [phone]. For online access to patient records enroll in [facility] [employer] at [email or web address].

## 2023-03-19 NOTE — Progress Notes (Signed)
Social Work Progress Note    ASSESSMENT:  Admit reason: This is a 57 year old old female admitted for Spinal stenosis of lumbar region with neurogenic claudication [M48.062]  Lumbar stenosis with neurogenic claudication [M48.062].  Anticipated disposition/needs: Resume HHPT/OT  Potential barriers/concerns: None at this time  Funding: Payor: [employer] BLUE CROSS / Plan: [employer] PLUS / Product Type: PPO  Primary Decision Maker: Patient is their own decision maker    Outpatient Providers:  Patient Care Team:  Lysle Rubens, MD as PCP - General (Family Practice)  Shirlean Schlein, PharmD as Pharmacist (Pharmacy)    INTERVENTIONS and REFERRALS:  The following interventions and referrals have been initiated: Chart Review/Screening;Care Coordination;Discharge planning;Consult with healthcare team/provider/community;Home Health Referral     SW consulted with medical team. SW consulted with bedside RN.  SW met with patient  at bedside, introduced self and role and offered support. SW discussed the teams recommendations in regards Home with Home Health PT/OT. Patient stated that prior to her admissions, she was with [facility] and reported that she wants to resume with them.     SW requested to send referral to [facility].      PLAN or OUTCOME:  SW will f/u with HH.    Hoyle Barr, MSW   Neurosurgery & GynOnc.   Social Work & Dispensing optician of [employer]

## 2023-03-19 NOTE — Discharge Instructions (Signed)
Neurosurgery follow up:  You are scheduled with the neurosurgery team on 04/05/2023 for wound check and post-op follow up. At that time we will check to see how you are recovering.    INSTRUCTIONS FOR AFTER YOUR SPINAL FUSION SURGERY:  --No NSAIDs (Ibuprofen, Aleve, Advil, Naproxen, Diclofenac, Motrin, etc) for 6 weeks after surgery  as NSAIDS may delay or inhibit fusion rates. Post operative pain is multifactorial. For sharp deep surgical site pain the best medications to use is the Tylenol and then the narcotic if the Tylenol is not enough. For the tight/achy/pressure type pain from muscle tension the best medication to use is the muscle relaxant. For the electric/tingling/fire nerve type pain there are nerve pain medication that are specific for this type of pain that you aren't often discharged with.  --Call for any redness, discharge or opening from your wound  --Call for increased swelling around your wound.  --Ok to shower 48 hours after surgery, use regular shampoo, avoid active scrubbing of the surgical incision  --Do not soak in a bathtub/pools/hot tub until 6-8 weeks postoperatively or cleared by neurosurgery provider. Your incision will be checked by the doctor or at your first visit, 2 weeks after your surgery.   --Keep incision open to air, do not apply any ointments   --Expect to have new nerve pain in a similar distribution following surgery. This pain should greatly improve over the first 2-3 weeks. Please call clinic with concerns  --If you were discharged in a brace you must wear when upright for 6 weeks post op. You do not need to wear your brace while lying or sleeping. You may remove for short periods for showering.     Activity:   --You are restricted from heavy lifting for 3 months post op.   Month 1: Do not lift, pull, push objects weighing over 10 pounds  Month 2: Do not lift, pull, push objects weighing over 20 pounds  Month 3: Do not lift, pull, push objects weighing over 30  pounds  --Frequent walking is encouraged  --Avoid repetitive bending or twisting of spine. It is recommended you bend at the knees instead. Log Roll" in and out of bed.  --It may be most comfortable to alternate sitting, walking, and reclining  --Avoid high impact activities (running, contact sports, etc.) until cleared by Neurosurgery  --You may drive once off narcotic pain medications    Contact the Neurosurgery team if you develop any of the following:   Severe or unusual headache    Nausea and vomiting    Temperature over 101.5    Problems with balance or falls    Purulent discharge from wound    Stroke-like symptoms such as facial drop, weakness, numbness.    New extremity weakness    Loss of bowel and bladder control    Personality changes, confusion, memory problems, seizures     Call the Clinic Monday-Friday during business hours at  [phone] After business hours, weekends or holidays call [phone]

## 2023-03-20 ENCOUNTER — Encounter (HOSPITAL_BASED_OUTPATIENT_CLINIC_OR_DEPARTMENT_OTHER): Payer: Self-pay | Admitting: Neurological Surgery

## 2023-03-20 DIAGNOSIS — M48061 Spinal stenosis, lumbar region without neurogenic claudication: Secondary | ICD-10-CM

## 2023-03-22 MED ORDER — OXYCODONE HCL 5 MG OR TABS
5.0000 mg | ORAL_TABLET | ORAL | 0 refills | Status: DC | PRN
Start: 2023-03-22 — End: 2023-03-26

## 2023-03-22 NOTE — Telephone Encounter (Signed)
Hx: "Patient underwent Lumbar 4-5 transforaminal lumbar interbody fusion and Lumbar 4-5 laminectomy , with Dr. Nelson Chimes on 03/16/2023." (Per 5/31/202 DC summary).    Next OV: 04/05/2023 w/Anderson, ARNP    Situation: pt sent [facility] requesting to discuss oxycodone dosing/regimen noting pain. RN spoke w/pt. Pt reports while inpt they were taking 10-15mg  Q4, noting 10mg  Q4 was managing pain well. Since DC, new oxycodone script is for 5mg  Q4, pt has attempted to adhere to this regimen, noting pain is not well managed w/this dose. See below for weekend regimen of oxycodone. Pt reporting pressure and "squeezed" sensation wrapping around abd and back like a "wide belt" covering above and below belly button. This pressure is ongoing since surgery, but was better managed while admitted - more severe since being home on lower dose. Reports NEW sharp "muscular" pain to L hip that started since being home - pt denies fall/trauma, but attributes new pain to getting in/out of bed noting bed at home is lower that hospital bed. Denies new n/t, saddle anesthesia, new/sudden weakness or incontinence - RN educated pt to seek emergent care/911 if occur, pt verbalized understanding.      Oxycodone dosing over the weekend:  - Friday (5/31): 5mg  Q4 through night - pt reports pain not well managed.  - Saturday (6/1): 10mg  Q4 all day.  - Sunday (6/2): tried going back to 5mg  Q4, pain not controlled. Oxy 10mg  Q4 overnight and this AM (6/3).    current interventions:  - Oxycodone: prescribed 5mg  Q4 - not managing pain, over the weekend intermittently took 10mg  Q4. (See above details).  - Tylenol: 1,000mg  Q6hr  - Methocarbamol: 750mg  Q6hrs  - Gabapentin: 200mg  BID    Of note:  - 15 tabs oxycodone left, requesting refill to [facility] [address] [place].   - Pt spouse needs new FMLA paperwork completed, delivered updated form in person to clinic on 5/28. RN will f/up on forms.       Plan: RN advised that pt take Oxycodone as prescribed. Educated not to  take Methocarbamol and Oxycodone at the same time. Educated on SS of oversedation/overdose and to call 911 if suspecting. RN routing to APP for review of pain/triage and recommendation on pain management/regimen. Will inquire on refill and FMLA forms for spouse.

## 2023-03-22 NOTE — Telephone Encounter (Signed)
She feels like her nerve pain has improved some but having incisional and muscle pains. Also feels like she hasn't emptied out- had a small BM yesterday    - add miralax daily  - taking senna  - taking milk of magnesia    - increase oxy to 5-10 mg

## 2023-03-24 ENCOUNTER — Telehealth (INDEPENDENT_AMBULATORY_CARE_PROVIDER_SITE_OTHER): Payer: Self-pay | Admitting: Orthopaedic Surgery

## 2023-03-24 ENCOUNTER — Telehealth (HOSPITAL_BASED_OUTPATIENT_CLINIC_OR_DEPARTMENT_OTHER): Payer: Self-pay | Admitting: Neurological Surgery

## 2023-03-24 DIAGNOSIS — M48061 Spinal stenosis, lumbar region without neurogenic claudication: Secondary | ICD-10-CM

## 2023-03-24 DIAGNOSIS — Z981 Arthrodesis status: Secondary | ICD-10-CM

## 2023-03-24 DIAGNOSIS — M5416 Radiculopathy, lumbar region: Secondary | ICD-10-CM

## 2023-03-24 DIAGNOSIS — Z9889 Other specified postprocedural states: Secondary | ICD-10-CM

## 2023-03-24 DIAGNOSIS — M5136 Other intervertebral disc degeneration, lumbar region: Secondary | ICD-10-CM

## 2023-03-24 NOTE — Telephone Encounter (Signed)
Patient s/p 12/22/2022 complex L THA with Dr. Colin Mulders 12/22/2022    Patient had recent lumbar fusion surgery with Dr. Nelson Chimes 03/16/2023    She reports since then she has experienced severe pain and muscle spasms in glutes, hamstrings, quads, "from waist down to knee on left leg" since recent spine surgery. She states she is not able to lift left foot due to pain, and has just been dragging it when she walks.    She called to inform Dr. Helen Hashimoto team due to proximity of her symptoms to her hip which Dr. Colin Mulders replaced. She has also reached out to Dr. Shanda Bumps team to relay the symptoms she has been experiencing    Routing to clinical to assist- patient would appreciate a call back to discuss symptoms/ treatment when able

## 2023-03-24 NOTE — Telephone Encounter (Signed)
Spoke with the patient to discuss further her reported symptoms. She stated already spoke with her Neurosurgery team s/p 03/16/23 Amin, L4-5 transforaminal lumbar interbody fusion and L4-5 laminectomy and still awaiting for advise.     She wanted to ensure DR. Manner is aware of her symptoms and advise if there should be any concern on her hip (s/p  L THA 12/2022)

## 2023-03-24 NOTE — Telephone Encounter (Signed)
Pt LVM stating she would like to discuss her L leg.     Routing to RN pool for triage

## 2023-03-24 NOTE — Telephone Encounter (Signed)
Hx: 03/16/23 Amin, L4-5 transforaminal lumbar interbody fusion and L4-5 laminectomy     Last OV: 03/03/2023  Next OV: 04/05/2023    Situation: Pt left voicemail stating she would like to discuss left leg.    TC to pt. States that cramping/weakness started in hospital when straining leg getting in and out of bed. Progressive worsening since she's been home and states it feels like she's dragging it around now. States when she tries to move L leg/engage the muscles, she gets severe charley horse cramps that prevent movement. Cramps most severe to glutes.    She had a Home PT eval 6/4, PT told her it might be soft tissue strain.    States having some bilateral ankle swelling, but hard to prop up feet and not taking RA meds. Denies redness/warmth. States having some swelling in legs prior to surgery. States swelling improves when she's laying in bed.    Denies any other left sided weakness.    States having some numbness and tingling to R leg when she lays on it, but resolves after she moves off of it.    Of note, THA  on the left 12/2022. Encouraged pt to FYI her orthopedist.    States eating bananas and drinking plenty of fluids to help with cramping.    Incision:  Fever: denies  Chills: denies  Tachycardia: denies  Exhaustion: denies  Pain:3/10 with oxycodone 10mg  q4, 1g tylenol q6, methocarbamol 750mg  q6. Ice not effective, has not tried heat.  Discharge: denies  Dehiscence:denies    Spinal:  Numbness: denies  Tingling: denies  Weakness: L leg related to cramping but has control of LLE.  Saddle anesthesia:denies   Incontinence:denies  Mobility: progressive weakness to L leg r/t cramping    Reviewed Red flag symptoms. Pt stated understanding of emergent red flag symptoms and need to seek emergency care if they occur.     Pt also asked about status of FMLA paperwork.    Plan: Routing to APP for review. Encouraged pt to keep current medication regimen, try heat. Encouraged mobility as tolerated. Pt to [facility] orthopedist.

## 2023-03-25 NOTE — Telephone Encounter (Signed)
Hx: 03/16/23 Amin, L4-5 transforaminal lumbar interbody fusion and L4-5 laminectomy      Last OV: 03/03/2023  Next OV: 04/05/2023     Situation: TC to pt to confirm plan confirmation from APP and check in on symptoms.    Pt states symptoms have vastly improved over last 16 hours. Pt vocal quality and disposition cheery compared to tearful yesterday. Pt attributes this to reducing oxycodone from 10mg  q4 to 5mg  q4 as she remembered that she had issues with previous surgeries when weaning/when pain meds wearing off. Maintaining 750mg  methocarbamol q6, 1g tylenol q6.    States the muscle cramping is improved and she is able to have more mobility. States she still feels like she has a "muscle strain" to LLE but denies spasms to hip and glute that she c/o yesterday. States her pain is surgical discomfort that is reasonable in severity and location.    Denies s/s of infection. Denies spinal red flag symptoms and states understanding of emergent red flag symptoms and need to seek emergency care if they occur.     Pt has 29 5mg  oxycodone remaining. Pt inquiring if she can get hydrocodone or tramadol instead of an oxy refill.    Plan: Forwarding to APP pool for medication refill/review.

## 2023-03-25 NOTE — Telephone Encounter (Signed)
Taking oxycodone 10mg  q4h, finds that after the 4th hour, she starts to have muscle cramping. Cut down oxycodone dosage yesterday from 10mg  to 5mg , and found that her muscle spasms improved. Today has 27 oxycodone tablets left. Will continue through tomorrow with this oxycodone dosage. If she finds by tomorrow that 5mg  is not cutting it, then she will contact the clinic by early afternoon for a refill to get her through the weekend.

## 2023-03-26 ENCOUNTER — Telehealth (HOSPITAL_BASED_OUTPATIENT_CLINIC_OR_DEPARTMENT_OTHER): Payer: Self-pay | Admitting: Neurological Surgery

## 2023-03-26 MED ORDER — TIZANIDINE HCL 2 MG OR TABS
2.0000 mg | ORAL_TABLET | Freq: Three times a day (TID) | ORAL | 0 refills | Status: DC
Start: 2023-03-26 — End: 2023-03-30

## 2023-03-26 MED ORDER — OXYCODONE HCL 5 MG OR TABS
5.0000 mg | ORAL_TABLET | ORAL | 0 refills | Status: DC | PRN
Start: 2023-03-26 — End: 2023-04-01

## 2023-03-26 NOTE — Telephone Encounter (Signed)
Situation:  Returned TC to pt. States feeling ok right now, but did not have a good night. Awoken by full leg cramping at 2300 with cramps most severe at glutes and hip. Took 5mg  oxy at 0000 and 0400. Took additional 5mg  at 0430. Has been able to stay at 5mg  oxy q4 since.    Pt taking methocarbamol 750mg  q6. Pt utilizing heat/ice with some relief as well as doing PT exercises.    Pt asking about increasing methocarbamol dosage to avoid taking oxycodone. Feels like it is more cramping than pain. Is open to other muscle relaxers as well, has taken flexeril in the past but does not have insight on which is more effective.    Will need oxy refill over weekend. Especially if she needs to intermittently use 10mg . Currently has 21 left.  Currently has 32 750mg  methocarbamol on hand    Denies red flag symptoms. States understanding of emergent red flag symptoms and need to seek emergency care if they occur.      [facility] [address] is preferred pharmacy.    Plan:  Routing to APP pool and clinical support pool. Oxy orders pended.

## 2023-03-26 NOTE — Addendum Note (Signed)
Addended by: Carola Rhine on: 03/26/2023 12:06 PM     Modules accepted: Orders

## 2023-03-26 NOTE — Telephone Encounter (Signed)
Completing as duplicate. See TE from 6/5.

## 2023-03-26 NOTE — Telephone Encounter (Signed)
RETURN CALL: Voicemail - Detailed Message      SUBJECT:  Medical Concerns/Information - Medical Concerns/Information      MESSAGE: See signed TE 03/24/23. Patient called clinic to discuss her muscle spasms. CCR attempted to warm transfer to care team. Care team was assisting other patients.

## 2023-03-26 NOTE — Addendum Note (Signed)
Addended by: Feliciana Rossetti on: 03/26/2023 12:46 PM     Modules accepted: Orders

## 2023-03-26 NOTE — Telephone Encounter (Addendum)
Pain primarily of muscle cramping  Refill provided for oxycodone so she doesn't run out over the weekend. Also prescribed baclofen due her prior central stenosis  Rx sent to [facility] [address] in [place], [place]

## 2023-03-30 ENCOUNTER — Telehealth (HOSPITAL_BASED_OUTPATIENT_CLINIC_OR_DEPARTMENT_OTHER): Payer: Self-pay | Admitting: Neurological Surgery

## 2023-03-30 DIAGNOSIS — Z981 Arthrodesis status: Secondary | ICD-10-CM

## 2023-03-30 DIAGNOSIS — M5136 Other intervertebral disc degeneration, lumbar region: Secondary | ICD-10-CM

## 2023-03-30 DIAGNOSIS — Z9889 Other specified postprocedural states: Secondary | ICD-10-CM

## 2023-03-30 DIAGNOSIS — M48061 Spinal stenosis, lumbar region without neurogenic claudication: Secondary | ICD-10-CM

## 2023-03-30 DIAGNOSIS — M5416 Radiculopathy, lumbar region: Secondary | ICD-10-CM

## 2023-03-30 MED ORDER — TIZANIDINE HCL 2 MG OR TABS
2.0000 mg | ORAL_TABLET | Freq: Three times a day (TID) | ORAL | 0 refills | Status: DC
Start: 2023-03-30 — End: 2023-04-08

## 2023-03-30 NOTE — Telephone Encounter (Signed)
Hx: Lumbar 4-5 transforaminal lumbar interbody fusion and Lumbar 4-5 laminectomy, with Dr. Nelson Chimes on 03/16/2023." (Per 5/31/202 DC summary).     Last OV: Surgery Admit 03/16/23 Amin  Next OV: 04/05/23 Anderson    Situation: TC received, pt states "Muscle relaxer switch and has been help a lot now.  I weeks worth and hoping to get more since it is now working better.  tiZANidine refill should be sent to the same pharmacy.  PT also suggested lidocaine patches or CBD cream, will this be ok to take?"    TC to pt, states immediate relief and improved symptoms from switch from methocarbamol to tizanidine. Mobility improving. Requesting lidocaine patches and guidance on CBD topicals. Advised that if ordered, should not go directly on incision; pt states understanding and states they would be for the muscles of the thighs, not lumbar.    Denies red flag symptoms. States understanding of emergent red flag symptoms and need to seek emergency care if they occur.     Incision Red Flags:  Fever:denies  Chills:denies  Tachycardia:denies  Exhaustion:denies  Pain:denies  Discharge:denies  Dehiscence:denies    Spinal Red Flags:  Numbness: denies changes  Tingling: denies changes  Weakness: denies changes  Saddle anesthesia:denies changes  Incontinence: denies changes  Mobility:denies changes    Pain:  New or worsening? improving with tizanidine  Pain Score: 3/10  Severity: mild  Location:  Cramping to legs . "Surgical discomfort" to back.  Duration: constant but improves with medication  What makes pain worse? Medication timing  Current Interventions: moving around helpful, heat and ice prn helpful. Currently taking 5mg  oxycodone q5 and has 23 left, 2mg  q8 tizanidine with 6 left, tylenol 1g q6, states spacing tizanidine and oxycodone apart.     Requesting tizanidine refill, order for lidocaine patches, guidance on CBD topical. Declines oxycodone refill at this time; advised to give the clinic a couple business days for refill if needed  so she doesn't run out over the weekend.    Refill location: [facility] [address]    Plan: tizanidine refill order pended. Routed to APP pool for orders and guidance on tizanidine, lidocaine and CBD.

## 2023-03-30 NOTE — Telephone Encounter (Signed)
RETURN CALL: Voicemail - Detailed Message      SUBJECT:  General Message     MESSAGE: Muscle relaxer switch and has been help a lot now.  I weeks worth and hoping to get more since it is now working better.  tiZANidine refill should be sent to the same pharmacy.  PT also suggested lidocaine patches or CBD cream, will this be ok to take?  Please call to triage

## 2023-03-31 ENCOUNTER — Other Ambulatory Visit (HOSPITAL_BASED_OUTPATIENT_CLINIC_OR_DEPARTMENT_OTHER): Payer: Self-pay | Admitting: Student in an Organized Health Care Education/Training Program

## 2023-03-31 DIAGNOSIS — M48061 Spinal stenosis, lumbar region without neurogenic claudication: Secondary | ICD-10-CM

## 2023-03-31 MED ORDER — LIDOCAINE 5 % EX PTCH
1.0000 | MEDICATED_PATCH | CUTANEOUS | 0 refills | Status: DC
Start: 2023-03-31 — End: 2023-04-09

## 2023-03-31 NOTE — Telephone Encounter (Signed)
Lidocaine patches filled. I have no preference on topical CBD

## 2023-03-31 NOTE — Telephone Encounter (Signed)
MCM to pt with CBD and lidocaine patch guidance. Lidocaine patches ordered by Sherilyn Cooter PA.

## 2023-04-01 MED ORDER — OXYCODONE HCL 5 MG OR TABS
5.0000 mg | ORAL_TABLET | Freq: Four times a day (QID) | ORAL | 0 refills | Status: DC | PRN
Start: 2023-04-02 — End: 2023-04-08

## 2023-04-01 NOTE — Addendum Note (Signed)
Addended by: Feliciana Rossetti on: 04/01/2023 12:14 PM     Modules accepted: Orders

## 2023-04-01 NOTE — Addendum Note (Signed)
Addended by: Carola Rhine on: 04/01/2023 11:56 AM     Modules accepted: Orders

## 2023-04-01 NOTE — Telephone Encounter (Signed)
Hx: Lumbar 4-5 transforaminal lumbar interbody fusion and Lumbar 4-5 laminectomy, with Dr. Nelson Chimes on 03/16/2023." (Per 5/31/202 DC summary).      Last OV: Surgery Admit 03/16/23 Amin  Next OV: 04/05/23 Anderson     Situation: [facility] received 03/31/23 requesting oxycodone refill. TC to pt to triage.    Pt states having 15 5mg  pills left. This morning switched to from 5mg  q5 to 5mg  q6. States desire to wean within 1-2 weeks. Encouraged pt to cluster care/refill requests together.    Denies red flag symptoms. States understanding of emergent red flag symptoms and need to seek emergency care if they occur.      Incision Red Flags:  Fever:denies  Chills:denies  Tachycardia:denies  Exhaustion:denies  Pain:denies  Discharge:denies  Dehiscence:denies     Spinal Red Flags:  Numbness: denies changes  Tingling: denies changes  Weakness: denies changes  Saddle anesthesia:denies changes  Incontinence: denies changes  Mobility:denies changes     Pain:  New or worsening? no  Pain Score: 3-4/10 with meds  Severity: mild  Location: "Surgical discomfort" to back.  Duration: constant but improves with medication  What makes pain worse? Medication timing  Current Interventions: moving around helpful, heat and ice prn helpful. Transitioned this morning to taking 5mg  oxycodone q6 (from q5) and has 15 left.  Also taking 2mg  q8 tizanidine, tylenol 1g q6, states spacing tizanidine and oxycodone apart.     Requesting oxycodone refill. Will run out over the weekend.     Refill location: [facility] [address]     Plan: Order pended and routed to APP pool for refill. Sent high priority as pt will run out over weekend.

## 2023-04-01 NOTE — Telephone Encounter (Signed)
Oxycodone refill approved for pickup tomorrow. Thanks!

## 2023-04-02 ENCOUNTER — Other Ambulatory Visit (INDEPENDENT_AMBULATORY_CARE_PROVIDER_SITE_OTHER): Payer: Self-pay | Admitting: Orthopaedic Surgery

## 2023-04-02 DIAGNOSIS — Z96642 Presence of left artificial hip joint: Secondary | ICD-10-CM

## 2023-04-04 NOTE — Progress Notes (Signed)
2 Week Post Op Visit    History of Present Illness  Autumn Camacho is a 57 year old female PMH RA who underwent Lumbar 4-5 transforaminal lumbar interbody fusion and Lumbar 4-5 laminectomy with Dr. Nelson Chimes on 03/16/2023 and is here today for wound follow up.   Presents with husband    Surgical hx:  - L total knee replacement Dr. Colin Mulders 07/30/2020  - L knee revision total arthroplasty Dr. Colin Mulders 05/01/2020  - C7-T1 disctomy/ ACDF, Dr. Nelson Chimes. 08/22/2023  - C7 laminectomy, Dr. Deatra Canter, 09/06/2022  - L THA (12/22/22, Dr. Colin Mulders) with orthopedic surgery   - Lumbar 4-5 transforaminal lumbar interbody fusion and Lumbar 4-5 laminectomy Dr. Nelson Chimes 03/16/2023    Per Dr. Nelson Chimes Op note:  "presents with progressively worsening L4 radiculopathy secondary to L4-5 grade 1 spondylolisthesis and central canal stenosis. Symptoms have been worsening despite conservative management."  She takes leflunomide and certolizumab. Dr Janee Morn (rheumatology) contacted prior to surgery: "please advise patient to pause her medications 2 weeks before the surgery"     Pre-op symptoms: endorses numbness right leg lateral thigh, across anterior shin . Left foot only numbness. Reports right leg feels like it is weak because she has difficulty using with numbness. No urinary or bowel incontinence, no groin numbness.     Current symptoms: numbness is about half of pre-op and more on both sides instead of just the right.   Denies  saddle anesthesia, new weakness, loss of bowel/bladder    Pain regimen: oxycodone 5mg  every 7 hours, 2mg  q8 tizanidine, tylenol 1g Q6    Patient denies any fever, chills, CP, SOB, heart palpitations, constipation, diarrhea, dysuria, and BLE pain. Pt also denies redness, swelling, opening, and drainage from the wound.   She had swelling of the lower legs- but improved  Baseline incontinence has been improving.     Moving to [facility]- August 2023    HHPTOT has started     She reports her swallowing difficulty has resolved        Problem List:  Patient Active Problem List   Diagnosis    Rheumatoid arthritis involving multiple sites (HCC)    Rheumatoid arthritis (HCC)    Rheumatoid arthritis involving right hand (HCC)    Arthritis of carpometacarpal (CMC) joints of both thumbs    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    Osteoarthritis of right hip    Osteoarthritis of right knee    Knee dislocation, left, initial encounter    Failed total left knee replacement (HCC)    Chronic instability of left knee    Status post revision of total replacement of left knee    Chronic right-sided low back pain with sciatica    Lumbar spinal stenosis    Cervical stenosis of spinal canal    Depressed mood    Rheu arthritis w rheu factor of left hip w/o org/sys involv (HCC)    Recurrent genital HSV (herpes simplex virus) infection    Class 1 obesity due to excess calories with serious comorbidity and body mass index (BMI) of 34.0 to 34.9 in adult    Closed fracture of first thoracic vertebra (HCC)    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    methocarbamol (ROBAXIN) 750 mg, Oral, Every 6 hours PRN    metroNIDAZOLE 0.75 % cream 1 application, Topical, 2 times daily PRN    ondansetron (ZOFRAN) 4 mg, Oral, Every 8 hours PRN    oxyCODONE 5 mg, Oral, Every 6 hours PRN    polyethylene glycol 3350 17 GM/SCOOP oral powder Fill cap with powder to 17g line and dissolve in 4 to 8oz of water. Drink 1 time a day as  needed for constipation.    Psyllium Husk powder 5.6 g, Oral, Nightly    Senna-Time 8.6 mg, Oral, 2 times daily PRN    tiZANidine (ZANAFLEX) 2 mg, Oral, Every 8 hours scheduled    valACYclovir (VALTREX) 1,000 mg, Oral, 2 times daily        Physical Exam  BP 121/80   Pulse 92   Temp 36.6 C (Temporal)   Ht 5\' 1"  (1.549 m)   Wt 79.3 kg (174 lb 12.8 oz)   SpO2 93%   BMI 33.03 kg/m   General Appearance: NAD  Skin:  No erythema, swelling, discharge from or dehiscence of the wound, appears to be healing well with good reapproximation, shows no evidence of infection, separation or keloid formation  Mental status: Alert, cooperative and oriented x 3, follows commands   CN: Pupils equal and round, EOMI, remaining CN grossly intact and symmetric   Upper extremities:   Motor Strength Right Left   Deltoid             5/5 5/5   Biceps 3/5 3/5 b/l RA   Triceps 3/5 3/5   Wrist flexion 5/5 5/5   Wrist extension 5/5 5/5   Grip 5/5 5/5   Finger Abduction 5/5 5/5   Sensation grossly intact to light touch intact and symmetric, Proprioception intact.  Hoffman's negative  Deep tendon reflexes: Right Left   Biceps 2 2  Brachioradialis 2 2  Triceps 2 2  Lower extremities:                                                                    Motor Strength Right Left   Hip Flexion 5/5 5/5   Knee extension 5/5 5/5   Knee flexion  5/5 5/5   Plantar flexion 5/5 5/5   Dorsiflexion   EHL 5/5  3/5 5/5  3/5 b/l RA   Sensation grossly intact to light touch intact and symmetric, Proprioception intact.   Except: theigh down, and both feet top and bottom   Spine: Normal alignment, full ROM, no pain or crepitus with palpation of spinous processes  Stance and gait:   Posture is normal. Gait is steady with normal steps, base, arm swing, and turning.   Other tests:   Spurling's negative; Faber's test negative; Straight Leg Raise negative bilaterally  Psychiatric: Mood and affect are appropriate throughout the interview. Patient is able to cooperate  well  throughout the exam.        Assessment & Plan  Oneda Goward is a 57 year old female PMH RA who underwent Lumbar 4-5 transforaminal lumbar interbody fusion and Lumbar 4-5 laminectomy with Dr. Nelson Chimes on 03/16/2023 and is here today for wound follow up.       I was able to remove the 1 drain stich  without any complications, cleaning before and after with Chloraprep solution.   Patient was instructed to continue to keep his surgical site clean and dry, and to wash the area with warm soap water. Contact our clinic if pt develops fever, chills, sweats, or any erythema, discharge from or dehiscence of the wound. Patient should avoid soaking the incision in standing water such as bathtubs, pools, hot tubs, ect.  for 2 more weeks until the incision is completely healed without evidence of scabs.    Patient reports she has restarted leflunomide and certolizumab under the discretion of her rheumatologist. Her incision is healing well without issues.   Meloxicam will restart at 6-12 weeks after surgery to facilitate boney fusion.     Today she reports swelling of the lower legs that is unusual for her- I will obtain a BLE duplex out of an abundance of caution.     HHPTOT has started. Patient has been educated on importance of limiting activity levels and to not overexert their spine with severe twisting and turning motions. Instructed patient to continue to follow up with OP PT once she completes HHPT, which may be after she moves    No refills provided this visit.     Otherwise, should follow up as scheduled at 3 months with CT/XR prior. [place]. She can see Dr. Nelson Chimes via TM. We will obtain her 3 month imaging before she goes- or as an alternative she can obtain the imaging locally after the move. However, if there are any further questions or concerns patient should contact our office for a sooner appointment.      Federico Flake, ARNP  [facility]  [phone]

## 2023-04-05 ENCOUNTER — Ambulatory Visit: Payer: No Typology Code available for payment source | Attending: Registered Nurse | Admitting: Registered Nurse

## 2023-04-05 VITALS — BP 121/80 | HR 92 | Temp 97.9°F | Ht 61.0 in | Wt 174.8 lb

## 2023-04-05 DIAGNOSIS — M48062 Spinal stenosis, lumbar region with neurogenic claudication: Secondary | ICD-10-CM | POA: Insufficient documentation

## 2023-04-06 ENCOUNTER — Ambulatory Visit
Admission: RE | Admit: 2023-04-06 | Discharge: 2023-04-06 | Disposition: A | Discharge status: Home / Self Care | Payer: No Typology Code available for payment source | Attending: Vascular Surgery | Admitting: Vascular Surgery

## 2023-04-06 DIAGNOSIS — M48062 Spinal stenosis, lumbar region with neurogenic claudication: Secondary | ICD-10-CM | POA: Insufficient documentation

## 2023-04-08 ENCOUNTER — Other Ambulatory Visit (HOSPITAL_BASED_OUTPATIENT_CLINIC_OR_DEPARTMENT_OTHER): Payer: Self-pay | Admitting: Registered Nurse

## 2023-04-08 ENCOUNTER — Telehealth (HOSPITAL_BASED_OUTPATIENT_CLINIC_OR_DEPARTMENT_OTHER): Payer: Self-pay | Admitting: Neurological Surgery

## 2023-04-08 DIAGNOSIS — M5416 Radiculopathy, lumbar region: Secondary | ICD-10-CM

## 2023-04-08 DIAGNOSIS — Z9889 Other specified postprocedural states: Secondary | ICD-10-CM

## 2023-04-08 DIAGNOSIS — M5136 Other intervertebral disc degeneration, lumbar region: Secondary | ICD-10-CM

## 2023-04-08 DIAGNOSIS — Z981 Arthrodesis status: Secondary | ICD-10-CM

## 2023-04-08 DIAGNOSIS — M48061 Spinal stenosis, lumbar region without neurogenic claudication: Secondary | ICD-10-CM

## 2023-04-08 MED ORDER — TIZANIDINE HCL 2 MG OR TABS
2.0000 mg | ORAL_TABLET | Freq: Three times a day (TID) | ORAL | 0 refills | Status: DC
Start: 2023-04-08 — End: 2023-04-15

## 2023-04-08 MED ORDER — OXYCODONE HCL 5 MG OR TABS
5.0000 mg | ORAL_TABLET | Freq: Four times a day (QID) | ORAL | 0 refills | Status: DC | PRN
Start: 2023-04-08 — End: 2023-04-15

## 2023-04-08 NOTE — Addendum Note (Signed)
Addended by: Federico Flake on: 04/08/2023 03:58 PM     Modules accepted: Orders

## 2023-04-08 NOTE — Telephone Encounter (Signed)
Please see refills needing triage.

## 2023-04-08 NOTE — Telephone Encounter (Signed)
Duplicate. Please see additional 04/08/2023 TE regarding refill request.

## 2023-04-08 NOTE — Telephone Encounter (Signed)
RN called pt and provided update on refills sent by provider.

## 2023-04-08 NOTE — Telephone Encounter (Signed)
Hx: "57 year old female PMH RA who underwent Lumbar 4-5 transforaminal lumbar interbody fusion and Lumbar 4-5 laminectomy with Dr. Nelson Chimes on 03/16/2023" (per Dareen Piano, ARNP 04/05/2023 OV note)    Last OV: 04/05/2023 w/Anderson, ARNP  Next OV: 06/16/2023 w/Dr. Nelson Chimes     Situation: Pt LVM on RN line requesting refill for Oxycodone and Tizanidine. RN spoke w/pt, pt reports will be out today (04/08/2023). Requesting refills to [facility] [address] [place]. Endorsing ongoing, constant burning pain to mid low back that fluctuates w/activity and medication timing. Reports occasional sharp pain to mid/low back w/stairs and certain PT exercises. Denies new n/t, saddle anesthesia, new/sudden weakness or incontinence - RN educated pt to seek emergent care/911 if occur, pt verbalized understanding.     current interventions:  - Oxycodone: 5mg  Q7hrs   - Tizanidine: 2mg  Q8hrs  - Tylenol: 1,000 Q6hr  - Gabapentin: 200mg  BID  - heating pad helpful    Plan: Routing triage and refill request to APP pool for review. Pt ok w/MCM response once medications refilled.

## 2023-04-09 ENCOUNTER — Inpatient Hospital Stay (INDEPENDENT_AMBULATORY_CARE_PROVIDER_SITE_OTHER)
Admit: 2023-04-09 | Discharge: 2023-04-09 | Disposition: A | Discharge status: Home / Self Care | Payer: No Typology Code available for payment source | Source: Home / Self Care

## 2023-04-09 ENCOUNTER — Ambulatory Visit (INDEPENDENT_AMBULATORY_CARE_PROVIDER_SITE_OTHER): Payer: No Typology Code available for payment source | Admitting: Physician Assistant

## 2023-04-09 VITALS — BP 119/73 | HR 83 | Temp 98.0°F | Resp 18

## 2023-04-09 DIAGNOSIS — Z96642 Presence of left artificial hip joint: Secondary | ICD-10-CM

## 2023-04-09 DIAGNOSIS — Z4789 Encounter for other orthopedic aftercare: Secondary | ICD-10-CM

## 2023-04-09 NOTE — Patient Instructions (Signed)
Call with concerns or health changes.

## 2023-04-09 NOTE — Progress Notes (Signed)
Patient's Primary Care Physician: Lysle Rubens, MD    Diagnosis: 3 months s/p L total hip arthroplasty     [name] returns for follow-up today for the L THA (DOS 12/22/2022)    HPI:    She is doing well.  Her pain is well managed, and her incisional tenderness has resolved.  She denies redness, warmth or swelling.  She is ambulating with a wheelchair, states she use walker at home. She endorses doing mini/baby squats for strengthening.     Patient is 1 month s/p L4-L5 transformational lumbar interbody fusion and L4-L5 laminectomy with Dr. Nelson Chimes (DOS 03/16/2023). Patient is proud with this surgery but is concerned they did something to her implant. She reports a "pinch rubbing" feeling in the groin and buttock. She states this bothers her when straightening her leg or laying on her back or sitting in a chair for too long.       Functional history reveals that she can walk less than 1 block before needing to stop or rest.  [name] uses the aid of a walker for ambulation.  [name] does stairs needing a banister, one step at a time, and can sit in some chairs, 1/2 hour or less.  [name] arises from a chair with difficulty, needing both arms, and can reach socks and shoes with difficulty.    On a visual-analogue pain scale, she rates the pain as a 3 out of 10 in severity at rest, and as a 2 out of 10 in severity with weight bearing.    Of note, patient stated she is moving back to [place].     ROS:  The patient reports no tingling, numbness, or weakness in the affected extremity.    EXAM:  [name] is oriented to time, place, and person.   BP 119/73   Pulse 83   Temp 36.7 C (Temporal)   Resp 18   SpO2 97%      L hip: Incision is well healed, without drainage, erythema, or excessive warmth.    X-RAYS:    Well aligned, well fixed L total hip arthroplasty     IMPRESSION:  Doing well 3 months s/p L total hip arthroplasty    PLAN:  We discussed the sensation she is feeling in the groin and  buttock may be a hip flexor issue since she is in a state of sitting for too long. Recommend she work on elongating her hip flexors and iliopsoas.   Antibiotic prophylaxis for dental work and invasive procedures such as colonoscopy were reviewed, as was the importance of routine radiographic surveillance of joint replacements.  Follow-up will be at 1 year postoperative with x-rays, then every 2 years thereafter.        I, Air cabin crew, acted as a Neurosurgeon for [employer], PA-C, in documenting the service or procedure. To the best of my knowledge, I recorded what was dictated by Nicholos Johns, PA-C.      I, Nicholos Johns, PA-C, interviewed and examined the patient while overseeing the documentation performed by my Medical Scribe, Air cabin crew. I have reviewed and revised as necessary the scribe's note and agree with the documented findings and plan of care. Please refer to the scribe's note above for further detailed information regarding the patient encounter and exam.  04/09/2023. 10:00 AM

## 2023-04-13 ENCOUNTER — Ambulatory Visit (HOSPITAL_BASED_OUTPATIENT_CLINIC_OR_DEPARTMENT_OTHER): Payer: Medicare Other | Admitting: Orthopaedic Surgery

## 2023-04-15 ENCOUNTER — Encounter (HOSPITAL_BASED_OUTPATIENT_CLINIC_OR_DEPARTMENT_OTHER): Payer: Self-pay

## 2023-04-15 ENCOUNTER — Telehealth (HOSPITAL_BASED_OUTPATIENT_CLINIC_OR_DEPARTMENT_OTHER): Payer: Self-pay | Admitting: Neurological Surgery

## 2023-04-15 DIAGNOSIS — Z981 Arthrodesis status: Secondary | ICD-10-CM

## 2023-04-15 DIAGNOSIS — M48062 Spinal stenosis, lumbar region with neurogenic claudication: Secondary | ICD-10-CM

## 2023-04-15 DIAGNOSIS — M5136 Other intervertebral disc degeneration, lumbar region: Secondary | ICD-10-CM

## 2023-04-15 DIAGNOSIS — M5416 Radiculopathy, lumbar region: Secondary | ICD-10-CM

## 2023-04-15 DIAGNOSIS — M4316 Spondylolisthesis, lumbar region: Secondary | ICD-10-CM

## 2023-04-15 DIAGNOSIS — Z9889 Other specified postprocedural states: Secondary | ICD-10-CM

## 2023-04-15 DIAGNOSIS — M48061 Spinal stenosis, lumbar region without neurogenic claudication: Secondary | ICD-10-CM

## 2023-04-15 MED ORDER — OXYCODONE HCL 5 MG OR TABS
2.5000 mg | ORAL_TABLET | Freq: Four times a day (QID) | ORAL | 0 refills | Status: DC | PRN
Start: 2023-04-15 — End: 2023-04-30

## 2023-04-15 MED ORDER — TIZANIDINE HCL 2 MG OR TABS
2.0000 mg | ORAL_TABLET | Freq: Three times a day (TID) | ORAL | 0 refills | Status: DC
Start: 2023-04-15 — End: 2023-04-30

## 2023-04-15 NOTE — Telephone Encounter (Signed)
Patient called requesting refill on Oxycodone and Tizanidine prescription. She Informed  the muscle relaxer will run out after today. Please reach out to patient.    UJ:[id number] Lumbar 4-5 transforaminal lumbar interbody fusion and Lumbar 4-5 laminectomy with Dr. Nelson Chimes on 03/16/2023.

## 2023-04-15 NOTE — Telephone Encounter (Signed)
Hx: Autumn Camacho is a 57yo female with hx of RA who underwent Lumbar 4-5 transforaminal lumbar interbody fusion and Lumbar 4-5 laminectomy with Dr. Nelson Chimes on 03/16/2023.     Last OV: 04/05/23 with Annalisa ARNP  Next OV: 06/16/23 with Dr. Nelson Chimes    Situation: Pt requesting oxycodone and tizanidine refill. Reports she continues to wean off the oxycodone and is currently taking 5mg  BID. Next week she will start to take 2.5mg  BID then she will stop the medication, at that point she will be at the 6 week mark.     Oxycodone - 8 tablets left  Tizanidine - 4 tablets left    Pain Score: 5/10 towards the end of 12hrs period  Severity: Moderate  Location: Low back and surgical site   Quality: "hot burny achy" kind of pain  What makes pain worse? Nothing. Being careful to not bend, twist or lift anything.   What makes pain better? Walking- laps around the house and medication.   Current Interventions: walking, tizanidine 1 tablet Q8hrs, tylenol 1000mg  Q6, gabapentin 200mg  BID, oxycodone 5mg  BID     SENSATION: Numbness and tingling to bilateral feet. Feels worse as she is weaning off the oxycodone.   NEW WEAKNESS: Denies  BOWEL/BLADDER FUNCTION: Denies bowel or bladder incontinence. At baseline with movements.     Red Flag Teaching: Denies saddle anesthesia and bowel/bladder incontinence.    Incision: Looking great, her spouse regularly checks incision site. Denies drainage or new redness.     Reports yesterday she was released from in home PT. Requesting PT referral. Requests PT referral be faxed to [facility] [address] [place]  [place], [place] [address] [phone] fax: [phone]    Preferred pharmacy pended.     Route to APP pool high alert as pt will be out of tizanidine tomorrow

## 2023-04-15 NOTE — Telephone Encounter (Signed)
Nerve symptoms worse when in a sitting position > 45 minutes   Has had good response to gabapentin, and only tried increased doses prior to surgery    Plan:   Increase gabapentin frequency to 200mg  tid   Refilled oxycodone 2.5-5mg  q6h   Refilled tizanidine 2mg  q8h  External PT order to [facility]

## 2023-04-15 NOTE — Telephone Encounter (Signed)
Faxed Referral for PT to  [facility] [address] [place], [place] [address]   [phone] fax: [phone]    Notified pt via [facility]

## 2023-04-16 ENCOUNTER — Telehealth (HOSPITAL_BASED_OUTPATIENT_CLINIC_OR_DEPARTMENT_OTHER): Payer: Self-pay | Admitting: Neurological Surgery

## 2023-04-16 ENCOUNTER — Other Ambulatory Visit (HOSPITAL_BASED_OUTPATIENT_CLINIC_OR_DEPARTMENT_OTHER): Payer: Self-pay

## 2023-04-16 ENCOUNTER — Telehealth (HOSPITAL_BASED_OUTPATIENT_CLINIC_OR_DEPARTMENT_OTHER): Payer: Self-pay | Admitting: Rheumatology

## 2023-04-16 DIAGNOSIS — M0579 Rheumatoid arthritis with rheumatoid factor of multiple sites without organ or systems involvement: Secondary | ICD-10-CM

## 2023-04-16 NOTE — Telephone Encounter (Signed)
RETURN CALL: Voicemail - Detailed Message      SUBJECT:  Refill Request     NAME OF MEDICATION(S): Oxycodone 5mg   DATE NEEDED BY: now  PRESCRIBING PROVIDER: amin  PHARMACY NAME/LOCATION: [facility] [address] [place]  ADDITIONAL INFORMATION: Patient spoke with Theora Gianotti regarding getting the confusion regarding this medication refilled. Patient just spoke to Pharmacist at [facility] and they said that no one from [facility] called and will not refill. Please call her as she is out of this pain medication and can't go all week end with out.

## 2023-04-16 NOTE — Telephone Encounter (Signed)
Patient called clinic, stating insurance is requesting more information prior to refilling the medication. Patient would like a call back to discuss further information. Thank you

## 2023-04-16 NOTE — Telephone Encounter (Signed)
Dr. [name] patient.     Patient moving to [facility], requests external referral letter to be sent to:    Dr. Pollyann Savoy  [address]  [place], [place] [address]  Phone [phone]

## 2023-04-16 NOTE — Telephone Encounter (Signed)
Called and spoke with [name] and she confirmed she was able to get the refill.

## 2023-04-16 NOTE — Telephone Encounter (Signed)
Called and spoke with pharmacist Elenora Fender at [facility] he states he is not showing any difficulty with filling the medications (tizanidine or oxycodone) not insurance stop noted. Advised that pt call back in 1hr to check on fill of medication.     Called and spoke with [name] and notified her of the above. She will f/u with the pharmacy and will contact clinic if she has any further questions.

## 2023-04-16 NOTE — Telephone Encounter (Signed)
Called and spoke with pt see TE dated 6/28 for f/u

## 2023-04-16 NOTE — Telephone Encounter (Signed)
Patient states the issues with medication has been resolved.  Please call for questions or follow up.    Thank you.

## 2023-04-19 ENCOUNTER — Other Ambulatory Visit (HOSPITAL_BASED_OUTPATIENT_CLINIC_OR_DEPARTMENT_OTHER): Payer: Self-pay | Admitting: Rheumatology

## 2023-04-19 DIAGNOSIS — M0579 Rheumatoid arthritis with rheumatoid factor of multiple sites without organ or systems involvement: Secondary | ICD-10-CM

## 2023-04-19 NOTE — Telephone Encounter (Signed)
Please send the referral I placed.

## 2023-04-20 NOTE — Telephone Encounter (Signed)
Received prior authorization request from pharmacy:      RN spoke with [facility] pharmacy (ph [phone]) who confirms patient has picked up medication and is unsure why PA was sent to neurosurgery. Nothing further needed.

## 2023-04-21 ENCOUNTER — Encounter (HOSPITAL_BASED_OUTPATIENT_CLINIC_OR_DEPARTMENT_OTHER): Payer: Self-pay

## 2023-04-21 ENCOUNTER — Other Ambulatory Visit (HOSPITAL_BASED_OUTPATIENT_CLINIC_OR_DEPARTMENT_OTHER): Payer: Self-pay | Admitting: Rheumatology

## 2023-04-21 ENCOUNTER — Ambulatory Visit (HOSPITAL_BASED_OUTPATIENT_CLINIC_OR_DEPARTMENT_OTHER): Payer: No Typology Code available for payment source

## 2023-04-21 DIAGNOSIS — M0579 Rheumatoid arthritis with rheumatoid factor of multiple sites without organ or systems involvement: Secondary | ICD-10-CM

## 2023-04-21 MED ORDER — LEFLUNOMIDE 20 MG OR TABS
20.0000 mg | ORAL_TABLET | Freq: Every day | ORAL | 0 refills | Status: DC
Start: 2023-04-21 — End: 2023-05-26

## 2023-04-21 NOTE — Telephone Encounter (Signed)
LEFLUNOMIDE was already sent to pharmacy. Removing duplicate request from the refill inbox, patient to check with pharmacy on readiness.

## 2023-04-21 NOTE — Telephone Encounter (Signed)
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    Meloxicam     Dispensed Written Strength Quantity Refills Days Supply Provider Pharmacy   MELOXICAM 7.5 MG TABLET 02/10/2023 02/10/2023 7.5 MG 90 tablet  45 Doug Sou, MD [facility]   MELOXICAM 7.5 MG TABLET 02/10/2023 02/10/2023 7.5 MG 90 tablet  45 Doug Sou, MD [facility]   MELOXICAM 7.5 MG TABLET 08/19/2022 06/04/2022 7.5 MG 90 tablet  45 Doug Sou, MD [facility]   MELOXICAM 7.5 MG TABLET 08/19/2022 06/04/2022 7.5 MG 90 tablet  45 Doug Sou, MD [facility]       Meloxicam     Dispensed Written Strength Quantity Refills Days Supply Provider Pharmacy   MELOXICAM 7.5 MG TABLET 02/10/23 02/10/23 7.5 MG 90 tablet  45 Doug Sou, MD [facility]   MELOXICAM 7.5 MG TABLET 02/10/23 02/10/23 7.5 MG 90 tablet  45 Doug Sou, MD [facility]   MELOXICAM 7.5 MG TABLET 08/19/22 06/04/22 7.5 MG 90 tablet  45 Doug Sou, MD [facility]   MELOXICAM 7.5 MG TABLET 08/19/22 06/04/22 7.5 MG 90 tablet  45 Doug Sou, MD [facility]       This Order Has Been Discontinued    Order Status Reason By On   Discontinued None Burr Medico, ARNP 03/19/23 1338     INSTRUCTIONS FOR AFTER YOUR SPINAL FUSION SURGERY:  --No NSAIDs (Ibuprofen, Aleve, Advil, Naproxen, Diclofenac, Motrin, etc) for 6 weeks after surgery  as NSAIDS may delay or inhibit fusion rates. Post operative pain is multifactorial. For sharp deep surgical site pain the best medications to use is the Tylenol and then the narcotic if the Tylenol is not enough. For the tight/achy/pressure type pain from muscle tension the best medication to use is the muscle relaxant. For the electric/tingling/fire nerve type pain there are nerve pain medication that are specific for this type of pain that you aren't often discharged with.

## 2023-04-21 NOTE — Telephone Encounter (Signed)
SUBJECT:  Refill Request     NAME OF MEDICATION(S): meloxicam 7.5 mg 2x day    DATE NEEDED BY: 04/05/23    PRESCRIBING PROVIDER: Dr. Janee Morn    PHARMACY NAME/LOCATION: [facility]     ADDITIONAL INFORMATION:

## 2023-04-24 ENCOUNTER — Ambulatory Visit (HOSPITAL_COMMUNITY): Payer: Self-pay | Admitting: Unknown Physician Specialty

## 2023-04-24 ENCOUNTER — Telehealth (HOSPITAL_COMMUNITY): Payer: Self-pay | Admitting: Student in an Organized Health Care Education/Training Program

## 2023-04-24 ENCOUNTER — Ambulatory Visit: Payer: Self-pay

## 2023-04-24 DIAGNOSIS — M5410 Radiculopathy, site unspecified: Secondary | ICD-10-CM

## 2023-04-24 MED ORDER — TRAMADOL HCL 50 MG OR TABS
50.0000 mg | ORAL_TABLET | Freq: Four times a day (QID) | ORAL | 0 refills | Status: AC | PRN
Start: 2023-04-24 — End: 2023-04-27

## 2023-04-24 NOTE — Telephone Encounter (Signed)
Patient called with worsening LLE radicular pain over the past 1 week. No bowel or bladder symptoms. Pain significantly worse than before surgery. No trauma. We discussed evaluated in our clinic this week if able and to continue pain medication at home in the mean time. No weakness or concern for cauda equina.

## 2023-04-24 NOTE — Telephone Encounter (Addendum)
HIPAA verified x3. Patient called to voice complaint of severe pain back on Neurotin 600 mg TID , started  04/23/2023 Cymbalta 30mg  for 10 days then increase to 60 mg HAD 05/28/2024fusion L4 and L5 with Laminectomy. Has oxycodone but doesn't want to take it. Last took oxycodone Wednesday, taking Trazadine qid for muscle spasms. [facility] #[id number] [phone] [address] [place] [phone] [id number]  [address] [place] [address]  Phone: [phone]  Fax: [phone]   Has had nerve pain all along, but worse since 04/22/2023, canT sit in one place for more than 15 minutes at a time. WISHES TO TRY TRAMADOL HAS WORKED IN PAST DOESN'T WANT TO GO BACK ON OXYCODONE.  PAGED 7:49 OCP SAMANTHA SADLER OPERATOR 22 . NO RETURN CALL SECOND PAGE PAGED 8:26PM OCP SAMANTHA SADLER OPERATOR 18 2ND PAGE    Patient advised to I'll page the on-call provider now. If you haven't heard from the provider (or me) within 30 minutes, call again.per Post-Op Incision Symptoms and Questions-A-AH protocol.       Patient verbalized understanding and was advised to call back or seek emergency care for any new or worsening symptoms.      Reason for Disposition   [1] Post-op pain AND [2] not controlled with pain medications    Additional Information   Negative: [1] Major abdominal surgical incision AND [2] wound gaping open AND [3] visible internal organs   Negative: Sounds like a life-threatening emergency to the triager   Negative: [1] Bleeding from incision AND [2] won't stop after 10 minutes of direct pressure   Negative: [1] Widespread rash AND [2] bright red, sunburn-like   Negative: Severe pain in the incision   Negative: [1] Incision gaping open AND [2] < 48 hours since wound re-opened   Negative: [1] Incision gaping open AND [2] length of opening > 2 inches (5 cm)   Negative: Patient sounds very sick or weak to the triager   Negative: Sounds like a serious complication to the triager   Negative: Fever > 100.4 F  (38.0 C)   Negative: [1] Incision looks infected (spreading redness, pain) AND [2] fever > 99.5 F (37.5 C)   Negative: [1] Incision looks infected (spreading redness, pain) AND [2] large red area (> 2 in. or 5 cm)   Negative: [1] Incision looks infected (spreading redness, pain) AND [2] face wound   Negative: [1] Red streak runs from the incision AND [2] longer than 1 inch (2.5 cm)   Negative: [1] Pus or bad-smelling fluid draining from incision AND [2] no fever    Answer Assessment - Initial Assessment Questions  1. SYMPTOM: "What's the main symptom you're concerned about?" (e.g., redness, pain, drainage)      Severe back pain low backpain, shoots down legs and pins and needles feet  2. ONSET: "When did start?"      Has had nerve pain all along, but worse since 04/22/2023, can sit in one place for more than 15 minutes at a time  3. SURGERY: "What surgery was performed?"      FUSION L4 TO L5 WITH LAMINECTOMY  4. DATE of SURGERY: "When was surgery performed?"       03/16/2023  5. INCISION SITE: "Where is the incision located?"       BACK  6. REDNESS: "Is there any redness at the incision site?" If yes, ask: "How wide across is the redness?" (Inches, centimeters)       DENIES  7.  PAIN: "Is there any pain?" If so, ask: "How bad is it?"  (Scale 1-10; or mild, moderate, severe)      8 TO 9  8. BLEEDING: "Is there any bleeding?" If so, ask: "How much?" and "Where?"      DENIES  9. DRAINAGE: "Is there any drainage from the incision site?" If yes, ask: "What color and how much?" (e.g., red, cloudy, pus; drops, teaspoon)      DENIES  10. FEVER: "Do you have a fever?" If so, ask: "What is your temperature, how was it measured, and when did it start?"        DENIES  11. OTHER SYMPTOMS: "Do you have any other symptoms?" (e.g., shaking chills, weakness, rash elsewhere on body)        INCREASED NERVE AFTER STOPPING OXYCODONE    Protocols used: Post-Op Incision Symptoms and Questions-A-AH

## 2023-04-24 NOTE — Telephone Encounter (Signed)
OCP returned page, will call patient.

## 2023-04-24 NOTE — Progress Notes (Addendum)
Lelon Frohlich Mcglocklin cont6acted our neurological surgery team with concern for pain.     Per Dr. Theola Sequin note:  Patient called with worsening LLE radicular pain over the past 1 week. No bowel or bladder symptoms. Pain significantly worse than before surgery. No trauma. We discussed evaluated in our clinic this week if able and to continue pain medication at home in the mean time. No weakness or concern for cauda equina.     Plan:  Three day course of tramadol to her outside pharmacy  Pt should reach back oout to our team at [facility] if concerns or ongoing /worsenign pain  If pt has neurologic exam change please reach out to our team or head to the nearest Emergency Department

## 2023-04-26 ENCOUNTER — Telehealth (HOSPITAL_BASED_OUTPATIENT_CLINIC_OR_DEPARTMENT_OTHER): Payer: Self-pay | Admitting: Neurological Surgery

## 2023-04-26 DIAGNOSIS — M48061 Spinal stenosis, lumbar region without neurogenic claudication: Secondary | ICD-10-CM

## 2023-04-26 NOTE — Telephone Encounter (Signed)
RETURN CALL: Voicemail - Detailed Message      SUBJECT:  Medical Concerns/Information - Medical Concerns/Information      MESSAGE:     DOS: 03/16/2023    The patient states they began to experience increased pain this weekend and spoke to an on call provider who prescribed pain medication. Reports they were advise the clinic would contact them today to schedule an MRI. CCR unable to view an MRI order. Please advise

## 2023-04-27 ENCOUNTER — Encounter (HOSPITAL_BASED_OUTPATIENT_CLINIC_OR_DEPARTMENT_OTHER): Payer: Self-pay | Admitting: Registered Nurse

## 2023-04-27 ENCOUNTER — Telehealth (HOSPITAL_BASED_OUTPATIENT_CLINIC_OR_DEPARTMENT_OTHER): Payer: Self-pay | Admitting: Neurological Surgery

## 2023-04-27 NOTE — Telephone Encounter (Signed)
Situation:  Received faxed request from [facility] pharmacy for [facility] ARNP to order a new 90-day prescription for gabapentin.        This RN does not see that [facility] AR[facility] is managing this prescription for patient. Patient is s/p lumbar 4-5 transforaminal lumbar interbody fusion and Lumbar 4-5 laminectomy with Dr. Nelson Chimes on 03/16/2023     Plan:  Routing to [facility] and Federico Flake ARNP to confirm we are not managing this medication for patient.

## 2023-04-27 NOTE — Telephone Encounter (Signed)
Pt is a 57 year old female with hx of rheumatoid arthritis and s/p lumbar 4-5 transforaminal lumbar interbody fusion and Lumbar 4-5 laminectomy with Dr.Anubha Amin on 03/16/2023.     Last visit: 04/05/2023 with Alen Blew ARNP      Next visit: 06/16/2023 with Gerome Sam      Pt messages clinic today indicating that she received recommendations by an on-call provider on 04/24/2023 to schedule an MRI in setting of worsening LLE radicular pain x one week.  Review of provider notes of 04/24/2023 indicates Resident Dr.Dominic Nistal recommends pt present to clinic this week for provider eval of worsening s/sx.     Plan:  RN messages pt via MyChart with instructions to call clinic and schedule a f/up appt in clinic this week for eval of worsening s/sx.    RN routing message to clinic front desk team for eval and scheduling assist.     Darnell Level Tera Partridge, RN  [facility] Ambulatory Float Team  04/27/2023 in [facility]    CC: Britt Bottom, APP team, Dr.Nistal

## 2023-04-27 NOTE — Telephone Encounter (Addendum)
History:  57 year old female PMH RA who underwent Lumbar 4-5 transforaminal lumbar interbody fusion and Lumbar 4-5 laminectomy with Dr. Nelson Chimes on 03/16/2023.    Last OV: 04/05/23 with Federico Flake ARNP    Situation:  Patient sends [facility] that she fell in the bathroom onto the toilet today after standing up and feeling a sharp pain in her lower middle back. She has been experiencing "extreme nerve pain" since stopping oxycodone and has appointment on Friday.     RN spoke with patient. She has been having lower back nerve pain running down RLE, but today's event was different. She "felt something slip" and fell onto toilet. Felt like something shifted in her spine and moved back. The intense pain is what caused her to fall, but pain quickly resolved. She denies any injury from fall so far as she can tell. However, as she has weaned off oxycodone her nerve pain in lower back down RLE has increased. She is no longer using oxycodone. Was prescribed a few days of tramadol 04/24/23 by Dr. Donney Dice to get her through until neurosurgery office visit on Friday.    Pain is currently under control with tramadol. Also using gabapentin 200 mg TID (ordered by PCP). Also using tizanidine to help with muscle spasms.    Denies new or worsening numbness or weakness. Denies new or worsening bowel/bladder incontinence. Denies s/s of infection including fever or incisional swelling, erythema, or drainage.    Plan:  RN advised patient to see emergent care for red-flag symptoms or uncontrolled pain. Patient verbalized understanding.     Routing to APP pool high-alert regarding today's event.

## 2023-04-27 NOTE — Telephone Encounter (Signed)
RN spoke with patient. Her PCP is managing gabapentin and she does not need refills. She will contact [facility] and ask they cancel this request.

## 2023-04-27 NOTE — Telephone Encounter (Signed)
RN spoke with patient and relayed message from [facility] PA that she is okay to followup as scheduled on Friday with Marella Bile ARNP. RN advised patient to call neurosurgery clinic or go to ED for any acute pain, new weakness or new numbness. Patient verbalized understanding.

## 2023-04-28 MED ORDER — METHYLPREDNISOLONE 4 MG OR TBPK
ORAL_TABLET | ORAL | 0 refills | Status: AC
Start: 2023-04-28 — End: 2023-05-03

## 2023-04-28 NOTE — Telephone Encounter (Signed)
Patient scheduled for appointment with Amil Amen 04/30/2023    Called patient to see how she is doing. Still having nerve flare.   Starts in middle lower back and goes all the way down right leg, burning, top of foot. Constant.   Denies new weakness    Will try medrol dose pack x 7 days

## 2023-04-29 NOTE — Progress Notes (Signed)
Follow-Up    DATE OF SERVICE    04/30/2023     HISTORY OF PRESENT ILLNESS   Autumn Camacho is a 57 year old female with neurosurgical hx of C7-T1 discectomy and ACDF Autumn Camacho, 11/'24), C7 laminectomy Autumn Camacho, 11/'23), and most recently, L4-5 TLIF and L4-5 laminectomy with Dr. Nelson Camacho on 03/16/23. Pre-operatively, she reported numbness at the right lateral thigh across anterior shin and left foot, as well as right leg weakness and difficulty using leg iso numbness. No urinary or bowel incontinence, no groin numbness. At last OV on 6/17, pt reports numbness improved.    Pt presents today for recent sharp pain and worsening radicular pain to RLE. Pt is accompanied by spouse, Autumn Camacho.     Interval History  On 7/9, pt reported while using the toilet, after standing to pull up her pants she felt a sharp pain in her lower mid back which radiated down RLE, and a sensation that "something slipped" in her spine. It resolved itself after a few minutes of rest and pain returned to baseline. However, since this episode, she experienced two episodes where while walking, she stepped forward with her right leg and felt sharp pain localized at incision site. No recurrence of slipping sensation.     Today, she reports LBP at prior incision site with cramping and radiating burning sensation to right hip, descends to anterior aspect of right leg to toes. Pain has progressively worsened since surgery, whereas prior to surgery she only had numbness and affects her activity level and sleep. She has had HH PT weekly for several weeks and was scheduled to start OP PT on 7/9 but cancelled due to the aforementioned episode.      Additionally, she reports feet intermittently feel and appear swollen since after surgery. Muscle spasms have nearly resolved.     Current pain regimen: weaned off oxycodone and reports "extreme nerve pain" since.   Medrol dose pak started 7/12    APAP 1000mg  q6h  Tramadol 50mg  (12 tabs rx by nsg team on 7/6) for  breakthrough pain  Gabapentin 200mg  tid (ordered by PCP)  Tizanidine 2mg  q8h   Cymbalta - stopped because it was causing oral ulcers      PHYSICAL EXAM  BP 122/80   Pulse 93   Temp 36.8 C (Temporal)   Resp 16   Ht 5\' 1"  (1.549 m)   Wt 78.9 kg (174 lb)   SpO2 (!) 89%   BMI 32.88 kg/m   General Appearance: NAD  Skin: Incision appears well healed. No erythema, swelling, discharge from or dehiscence of the wound.  Spine: No pain with palpation of spinous processes   Neurologic:   Mental status: Alert, cooperative and oriented x 3, follows commands   CN: Pupils equal and round, EOMI, face symmetric   Follows commands  Moves all extremities  Stance and gait:   Posture is normal. Gait not assessed. Presented in w/c.   Psychiatric: Mood and affect are appropriate throughout the interview. Patient is able to cooperate well throughout the exam.      IMAGING  No new imaging       ASSESSMENT & PLAN  Autumn Camacho is a 57 year old female s/p L4-5 TLIF and L4-5 laminectomy with Dr. Nelson Camacho on 03/16/23, who presented with worsening LBP and radiculopathy.     Spoke with patient regarding pain management and different types of pain. Pt reports pain that is primarily neuropathic in nature. Discussed nerve healing being a very  slow process occurring proximal to distal in relation to the proximity to the spinal cord and brain. This means the new functional baseline regarding pins/needles, numbness weakness will be reached around 12-18 months from surgery. If any deficits remain after that time frame, they are likely permanent. Additionally, nerve flares throughout recovery is due to hypersensitivity now that the nerve root is decompressed, this is a good thing and is a sign of the healing process.     Patient was recommended to increase gabapentin to 300mg  tid as ordered by PCP. Left message to PCP's office to inquire if okay to recommend increase to 400mg  tid after a week if not improved. Pt has follow-up with PCP on  7/30. Otherwise, discussed oxycodone not necessary for reported symptoms. Pt wishes to wean from tizanidine since muscle pain has mostly resolved. Advised to wean off tizanidine, and provided final refill to aid in taper.     MRI L spine wo contrast ordered to assess low back and worsened burning sensation. Rescheduled CT L spine wo contrast to be completed on the same day.     Pt is moving to [place] in 8/5 and requests referral for nsgy.    Thank you very much for allowing me to participate in Autumn Camacho's care. Please contact my office with any questions or concerns.     I spent a total time of 30 minutes face-to-face with the patient, of which more than 50% was spent counseling and coordinating care as outlined in this note.      Author:  Venia Carbon, DNP, ARNP  [facility] Neurosurgery

## 2023-04-30 ENCOUNTER — Encounter (HOSPITAL_BASED_OUTPATIENT_CLINIC_OR_DEPARTMENT_OTHER): Payer: Self-pay | Admitting: Rheumatology

## 2023-04-30 ENCOUNTER — Ambulatory Visit (HOSPITAL_BASED_OUTPATIENT_CLINIC_OR_DEPARTMENT_OTHER): Payer: No Typology Code available for payment source | Admitting: Registered Nurse

## 2023-04-30 ENCOUNTER — Ambulatory Visit: Payer: No Typology Code available for payment source | Attending: Rheumatology | Admitting: Rheumatology

## 2023-04-30 VITALS — BP 122/80 | HR 93 | Temp 98.3°F | Resp 16 | Ht 61.0 in | Wt 174.0 lb

## 2023-04-30 DIAGNOSIS — Z79899 Other long term (current) drug therapy: Secondary | ICD-10-CM | POA: Insufficient documentation

## 2023-04-30 DIAGNOSIS — M48061 Spinal stenosis, lumbar region without neurogenic claudication: Secondary | ICD-10-CM

## 2023-04-30 DIAGNOSIS — Z981 Arthrodesis status: Secondary | ICD-10-CM

## 2023-04-30 DIAGNOSIS — Z9889 Other specified postprocedural states: Secondary | ICD-10-CM | POA: Insufficient documentation

## 2023-04-30 DIAGNOSIS — M5136 Other intervertebral disc degeneration, lumbar region: Secondary | ICD-10-CM | POA: Insufficient documentation

## 2023-04-30 DIAGNOSIS — M0579 Rheumatoid arthritis with rheumatoid factor of multiple sites without organ or systems involvement: Secondary | ICD-10-CM | POA: Insufficient documentation

## 2023-04-30 DIAGNOSIS — M5416 Radiculopathy, lumbar region: Secondary | ICD-10-CM

## 2023-04-30 LAB — COMPREHENSIVE METABOLIC PANEL
ALT (GPT): 11 U/L (ref 7–33)
AST (GOT): 14 U/L (ref 9–38)
Albumin: 4 g/dL (ref 3.5–5.2)
Alkaline Phosphatase (Total): 79 U/L (ref 31–132)
Anion Gap: 8 (ref 4–12)
Bilirubin (Total): 0.2 mg/dL (ref 0.2–1.3)
Calcium: 9.5 mg/dL (ref 8.9–10.2)
Carbon Dioxide, Total: 26 meq/L (ref 22–32)
Chloride: 106 meq/L (ref 98–108)
Creatinine: 0.43 mg/dL (ref 0.38–1.02)
Glucose: 103 mg/dL (ref 62–125)
Potassium: 4.3 meq/L (ref 3.6–5.2)
Protein (Total): 7.1 g/dL (ref 6.0–8.2)
Sodium: 140 meq/L (ref 135–145)
Urea Nitrogen: 14 mg/dL (ref 8–21)
eGFR by CKD-EPI 2021: >60 mL/min/{1.73_m2} (ref >59)

## 2023-04-30 LAB — CBC, DIFF
% Basophils: 1 %
% Eosinophils: 0 %
% Immature Granulocytes: 0 %
% Lymphocytes: 17 %
% Monocytes: 4 %
% Neutrophils: 78 %
% Nucleated RBC: 0 %
Absolute Eosinophil Count: 0 10*3/uL (ref 0.00–0.50)
Absolute Lymphocyte Count: 1.05 10*3/uL (ref 1.00–4.80)
Basophils: 0.03 10*3/uL (ref 0.00–0.20)
Hematocrit: 40 % (ref 36.0–45.0)
Hemoglobin: 12.4 g/dL (ref 11.5–15.5)
Immature Granulocytes: 0.02 10*3/uL (ref 0.00–0.05)
MCH: 27.4 pg (ref 27.3–33.6)
MCHC: 31 g/dL — ABNORMAL LOW (ref 32.2–36.5)
MCV: 89 fL (ref 81–98)
Monocytes: 0.26 10*3/uL (ref 0.00–0.80)
Neutrophils: 4.98 10*3/uL (ref 1.80–7.00)
Nucleated RBC: 0 10*3/uL
Platelet Count: 314 10*3/uL (ref 150–400)
RBC: 4.52 10*6/uL (ref 3.80–5.00)
RDW-CV: 15.8 % — ABNORMAL HIGH (ref 11.0–14.5)
WBC: 6.34 10*3/uL (ref 4.3–10.0)

## 2023-04-30 LAB — SED RATE: Erythrocyte Sedimentation Rate: 29 mm/h — ABNORMAL HIGH (ref 0–20)

## 2023-04-30 LAB — C_REACTIVE PROTEIN: C_Reactive Protein: 2.9 mg/L (ref 0.0–10.0)

## 2023-04-30 MED ORDER — TIZANIDINE HCL 2 MG OR TABS
2.0000 mg | ORAL_TABLET | Freq: Three times a day (TID) | ORAL | 0 refills | Status: AC | PRN
Start: 2023-04-30 — End: ?

## 2023-04-30 NOTE — Progress Notes (Signed)
TELEMEDICINE ENCOUNTER     Rheumatology    Date of service: 04/30/2023     Distant Site Telemedicine Encounter  I conducted this encounter via secure, live, face-to-face video conference with the patient. I reviewed the risks and benefits of telemedicine as pertinent to this visit and the patient agreed to proceed.    Provider Location: On-site location (clinic, hospital, on-site office)  Patient Location: At home  Present with patient: No one else present       PRIMARY CARE PROVIDER:  Lysle Rubens, MD  [address]  [place],  [place] [address]     PATIENT: Autumn Camacho    MRN: [id number]    DOB: 08-17-1966    __________________________________     PRIMARY CARE PROVIDER:  Lysle Rubens, MD  [address]  [place],  [place] [address]     Reason for visit:    Rheumatoid arthritis     Last visit:  02/10/2023 via telemedicine     HPI:    Diagnosis: rheumatoid arthritis (1999)   Autoimmune Serologies: Anti-CCP pos, RF pos, ANA neg   Medications Used: methotrexate, enbrel, remicade, humira, orencia, xeljanz, rinvoq, cimzia     INTERVAL HISTORY:  Patient reports some pain in wrists, hands and elbows depending on her activities. Her symptoms are usually tolerable and she rates her pain at 3 to 4 out of 10. She denies morning stiffness. She underwent a back surgery for lumbar spinal stenosis on 02/24/2023 and is recovering for the surgery. She has been taking Cimzia 200mg  injections every 2 weeks, leflunomide 20mg  daily and meloxicam 7.5mg  twice daily. She feels Cimzia is working well for her RA and denies any side effects from her med. She plans to move to [place] in 05/2023.    CURRENT PROBLEM LIST:  Patient Active Problem List    Diagnosis Date Noted    Lumbar stenosis with neurogenic claudication [M48.062] 03/16/2023    Pharyngeal dysphagia [R13.13] 01/21/2023    Diverticulosis [K57.90] 12/22/2022    Recurrent genital HSV (herpes simplex virus) infection [A60.00] 12/17/2022    Class 1 obesity due  to excess calories with serious comorbidity and body mass index (BMI) of 34.0 to 34.9 in adult [E66.09, Z68.34] 12/17/2022    Closed fracture of first thoracic vertebra (HCC) [S22.019A] 12/17/2022    Loosening of hardware in spine [facility]) [T84.498A] 12/17/2022    Rheu arthritis w rheu factor of left hip w/o org/sys involv (HCC) [M05.752] 11/06/2022    Depressed mood [R45.89] 10/08/2022    Lumbar spinal stenosis [M48.061] 08/20/2022    Cervical stenosis of spinal canal [M48.02] 08/20/2022     12/2022:  s/p ACDF, now c/b hardware loosening and T1 vertebral body fracture, anticipating revision.       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] 02/02/2020     S/p two revision arthroplasties      Knee dislocation, left, initial encounter [S83.105A] 01/16/2020    Rheumatoid arthritis involving right hand (HCC) [M06.9] 10/17/2019    Arthritis of carpometacarpal (CMC) joints of both thumbs [M18.0] 10/17/2019    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 [M19.041] 10/17/2019    Rheumatoid arthritis (HCC) [M06.9]     Rheumatoid arthritis involving multiple sites (HCC) [M06.9] 02/26/2019  Osteoarthritis of right knee [M17.11] 05/10/2017    History of total right knee replacement [Z96.651] 05/09/2017    Osteoarthritis of right hip [M16.11] 10/03/2016        Current Medications:  No outpatient medications have been marked as taking for the 04/30/23 encounter (Telemedicine) with Doug Sou, MD.       ALLERGIES:  Allergies as of 04/30/2023 - Reviewed 04/09/2023   Allergen Reaction Noted    Infliximab Anaphylaxis and Other 08/17/2018    Adhesives Skin: Rash 08/25/2022    Cymbalta [duloxetine hcl] Skin: Rash and Angioedema/lip, tongue swelling 03/03/2023    Codeine Unknown and Other  08/17/2018    Cyanoacrylate Skin: Rash 08/25/2022    Fentanyl Unknown and Other 03/20/2020    Hydromorphone VZ:DGLOVF/IEPPIRJJ 03/20/2020    Nortriptyline Unknown and Other 04/08/2020       Past Medical History:  Past Medical History:   Diagnosis Date    Chronic right hip pain 02/26/2019    Cervical disc disorder     Chronic diarrhea     attributed to RA meds.    Chronic instability of knee, left knee     Diverticulosis     Moderate diverticulosis, w/out hx of diverticulitis or diverticular bleeding.    Fracture     Bilateral elbow fx    Genital herpes     Genital herpes     on chronic valacyclovir with no flares.    History of low back pain     Motion sickness     Neck pain     d/t " neck instability , loose fusion plate & screws "    Nerve pain     on Duloxetine.    RA (rheumatoid arthritis) (HCC)     Rheumatoid arthritis (HCC)     Seasonal allergies     Spinal stenosis     Tooth disorder     MISSING TOOTH UPPER LEFT BACK-MOLAR    Urinary incontinence     post Cervical fusion "but getting better "       Family History:  Family History       Problem (# of Occurrences) Relation (Name,Age of Onset)    Cancer (1) Father (Dad)    Heart Disease (1) Father (Dad)    Migraine Headaches (1) Mother (Mom)    Heart Attack (1) Father (Dad)    Rheumatoid Arthritis (1) Other             Past Surgical History:  Past Surgical History:   Procedure Laterality Date    CERVICAL FUSION  08/21/2022    9. Emergent C7-T1 anterior cervical discectomy & fusion.    CERVICAL LAMINECTOMY  08/27/2022    C7 laminectomy 08/27/2022.    EPIDURAL BLOCK INJECTION      With joint replacement surgeries    foot Bilateral     Arch surgery    HIP ARTHROPLASTY Right 07/30/2020    HIP ARTHROPLASTY Left 12/22/2022    JOINT REPLACEMENT      Both knees, right hip    KNEE ARTHROPLASTY Left 2018    KNEE ARTHROPLASTY Right 2018    KNEE TOTAL ARTHROPLASTY REVISION Left 05/01/2020    KNEE TOTAL ARTHROPLASTY REVISION Left 04/2018    04/2018 & 04/2020.    LAMINECTOMY   08/27/22    POSTERIOR CERVICAL LAMINECTOMY  08/27/2022    correction : C7 laminectomy and inferior C6 laminotomy @ [facility]    PR ADENOIDECTOMY PRIMARY <AGE 29      AGE 77  T/A     PR TONSILLECTOMY ONE-HALF <AGE 22      PR UNLISTED PROCEDURE FOOT/TOES Bilateral 2009 R,  2010 L    Reconstructive surgery    PR UNLISTED PROCEDURE NECK/THORAX  11/03&09/23    PR UNLISTED PROCEDURE PELVIS/HIP JOINT      DOS 07/30/20 R THA    SPINAL FUSION  08/21/22       IMMUNIZATION HISTORY:  Immunization History   Administered Date(s) Administered    COVID-19 Pfizer mRNA monovalent 12 yrs and older 01/10/2020, 01/27/2020, 06/06/2020    Hepatitis B recombinant (Heplisav-B) 05/08/2022    Influenza quadrivalent 07/20/2019    Pneumococcal conjugate PCV20 (Prevnar 20) 05/08/2022    Tdap 05/24/2019    Zoster recombinant (Shingrix) 06/22/2019, 07/20/2019, 08/26/2019       ALLERGIES: Infliximab, Adhesives, Cymbalta [Duloxetine Hcl], Codeine, Cyanoacrylate, Fentanyl, Hydromorphone, and Nortriptyline    SOCIAL HISTORY:  reports that she quit smoking about 9 years ago. Her smoking use included cigarettes. She started smoking about 29 years ago. She has a 20 pack-year smoking history. She has never used smokeless tobacco. She reports that she does not currently use alcohol. She reports that she does not use drugs.    FAMILY HISTORY: family history includes Cancer in her father; Heart Attack in her father; Heart Disease in her father; Migraine Headaches in her mother; Rheumatoid Arthritis in an other family member.    PHYSICAL EXAMINATION:   Vitals: No vitals were taken at this visit   General:  Awake, alert, and oriented, no apparent distress, pleasant, and cooperative  Psychologic:  Mood is euthymic, affect is congruent  HEENT:  Normocephalic atraumatic, anicteric sclera, neck supple   Pulmonary:  Non-labored breathing.  Skin:  The patient did not show me any rashes or open ulcers.     LABS:  Results for orders placed or performed during the hospital  encounter of 03/16/23   COVID-19 Coronavirus Qualitative PCR   Result Value Ref Range    COVID-19 Coronavirus Qual PCR Specimen Type Nasal swab     COVID-19 Coronavirus Qual PCR Result None detected NDET    COVID-19 Coronavirus Qual PCR Interpretation       This is a negative result. Laboratory testing alone cannot rule out infection, particularly in the presence of clinical risk factors such as symptoms or exposure history.   Basic Metabolic Panel   Result Value Ref Range    Sodium 140 135 - 145 meq/L    Potassium 3.6 3.6 - 5.2 meq/L    Chloride 107 98 - 108 meq/L    Carbon Dioxide, Total 24 22 - 32 meq/L    Anion Gap 9 4 - 12    Glucose 113 62 - 125 mg/dL    Urea Nitrogen 9 8 - 21 mg/dL    Creatinine 1.61 0.96 - 1.02 mg/dL    Calcium 8.4 (L) 8.9 - 10.2 mg/dL    eGFR by CKD-EPI 0454 >60 >59 mL/min/1.73_m2   CBC   Result Value Ref Range    WBC 12.62 (H) 4.3 - 10.0 10*3/uL    RBC 3.62 (L) 3.80 - 5.00 10*6/uL    Hemoglobin 10.2 (L) 11.5 - 15.5 g/dL    Hematocrit 32 (L) 09.8 - 45.0 %    MCV 89 81 - 98 fL    MCH 28.2 27.3 - 33.6 pg    MCHC 31.6 (L) 32.2 - 36.5 g/dL    Platelet Count 119 147 - 400 10*3/uL    RDW-CV 14.9 (H) 11.0 -  14.5 %   Basic Metabolic Panel   Result Value Ref Range    Sodium 141 135 - 145 meq/L    Potassium 3.7 3.6 - 5.2 meq/L    Chloride 109 (H) 98 - 108 meq/L    Carbon Dioxide, Total 25 22 - 32 meq/L    Anion Gap 7 4 - 12    Glucose 98 62 - 125 mg/dL    Urea Nitrogen 10 8 - 21 mg/dL    Creatinine 1.61 (L) 0.38 - 1.02 mg/dL    Calcium 7.8 (L) 8.9 - 10.2 mg/dL    eGFR by CKD-EPI 0960 >60 >59 mL/min/1.73_m2   CBC   Result Value Ref Range    WBC 9.33 4.3 - 10.0 10*3/uL    RBC 3.29 (L) 3.80 - 5.00 10*6/uL    Hemoglobin 9.1 (L) 11.5 - 15.5 g/dL    Hematocrit 30 (L) 45.4 - 45.0 %    MCV 90 81 - 98 fL    MCH 27.7 27.3 - 33.6 pg    MCHC 30.6 (L) 32.2 - 36.5 g/dL    Platelet Count 098 119 - 400 10*3/uL    RDW-CV 14.9 (H) 11.0 - 14.5 %   Basic Metabolic Panel   Result Value Ref Range    Sodium 139 135 - 145  meq/L    Potassium 3.6 3.6 - 5.2 meq/L    Chloride 105 98 - 108 meq/L    Carbon Dioxide, Total 25 22 - 32 meq/L    Anion Gap 9 4 - 12    Glucose 103 62 - 125 mg/dL    Urea Nitrogen 7 (L) 8 - 21 mg/dL    Creatinine 1.47 8.29 - 1.02 mg/dL    Calcium 7.9 (L) 8.9 - 10.2 mg/dL    eGFR by CKD-EPI 5621 >60 >59 mL/min/1.73_m2   CBC   Result Value Ref Range    WBC 8.40 4.3 - 10.0 10*3/uL    RBC 3.29 (L) 3.80 - 5.00 10*6/uL    Hemoglobin 9.4 (L) 11.5 - 15.5 g/dL    Hematocrit 30 (L) 30.8 - 45.0 %    MCV 91 81 - 98 fL    MCH 28.6 27.3 - 33.6 pg    MCHC 31.3 (L) 32.2 - 36.5 g/dL    Platelet Count 657 846 - 400 10*3/uL    RDW-CV 15.1 (H) 11.0 - 14.5 %   POC Glucose, Whole Blood - [place]   Result Value Ref Range    Glucose (POC) 88 62 - 125 mg/dL   POC Glucose, Whole Blood - [place]   Result Value Ref Range    Glucose (POC) 96 62 - 125 mg/dL       IMAGING:  None      IMPRESSION/RECOMMENDATIONS:     Autumn Camacho is a 57 year old female with history of bilateral knee and hip replacements who was seen today via telemedicine for evaluation of rheumatoid arthritis. She reported pain in wrists, hands and elbows depending on her activities. Her symptoms were usually tolerable and she rated her pain at 3 to 4 out of 10. She denied morning stiffness. She underwent a back surgery for lumbar spinal stenosis on 02/24/2023 and was recovering for the surgery. She has been taking Cimzia 200mg  injections every 2 weeks, leflunomide 20mg  daily and meloxicam 7.5mg  twice daily. She felt Cimzia was working well for her RA and denied any side effects from her med.       I discussed  that the patient's condition appeared to be stable and ordered surveillance labs. Her previous lab showed normocytic anemia. I counseled her on treatment plans. I advised her to maintain Cimzia and leflunomide and decrease meloxicam dose to reduce the risks for potential adverse effects. I will get in touch with her after reviewing her labs. She will  move back to [place] and transfer her care to a new rheumatologist.      Assessment:  1. Rheumatoid arthritis   2. Spinal stenosis s/p back surgery  3. Normocytic anemia    PLAN:  The following was discussed with the patient.  Recommendations are as follows:  Continue Cimzia 200mg  sc q2wks  Continue leflunomide 20mg  po qd  Decrease meloxicam to 7.5mg  po qd prn  Check CBC, CMP, ESR, CRP       Follow up: PRN

## 2023-04-30 NOTE — Patient Instructions (Signed)
Pain:  - can increase gabapentin 300mg  thrree times daily. Will discuss with PCP is ok to increase to 400mg  three times a day if ineffective. Follow-up with PCP 7/30.  - refill for tizanidine 2mg  every 8 hours     Ordered imaging:  MRI L spine wo contrast   Reschedule CT L spine wo contrast to be completed on the same day    [place] 

## 2023-05-01 ENCOUNTER — Encounter (HOSPITAL_COMMUNITY): Payer: Self-pay

## 2023-05-04 ENCOUNTER — Telehealth (HOSPITAL_BASED_OUTPATIENT_CLINIC_OR_DEPARTMENT_OTHER): Payer: Self-pay | Admitting: Registered Nurse

## 2023-05-04 NOTE — Telephone Encounter (Signed)
RETURN CALL: Voicemail - General Message      SUBJECT:  General Message     MESSAGE: Heidi from [facility] at Dr. Mora Bellman Farooqui's office is returning provider Marella Bile, ARNP's call from 7/12 regarding medication change. Please contact.

## 2023-05-05 ENCOUNTER — Ambulatory Visit
Admission: RE | Admit: 2023-05-05 | Discharge: 2023-05-05 | Disposition: A | Discharge status: Home / Self Care | Payer: No Typology Code available for payment source | Attending: Diagnostic Radiology | Admitting: Diagnostic Radiology

## 2023-05-05 ENCOUNTER — Encounter (HOSPITAL_COMMUNITY): Payer: Self-pay

## 2023-05-05 ENCOUNTER — Ambulatory Visit (HOSPITAL_BASED_OUTPATIENT_CLINIC_OR_DEPARTMENT_OTHER): Payer: No Typology Code available for payment source

## 2023-05-05 DIAGNOSIS — Z981 Arthrodesis status: Secondary | ICD-10-CM

## 2023-05-05 DIAGNOSIS — M5136 Other intervertebral disc degeneration, lumbar region: Secondary | ICD-10-CM

## 2023-05-05 DIAGNOSIS — M48061 Spinal stenosis, lumbar region without neurogenic claudication: Secondary | ICD-10-CM | POA: Insufficient documentation

## 2023-05-05 DIAGNOSIS — M5416 Radiculopathy, lumbar region: Secondary | ICD-10-CM | POA: Insufficient documentation

## 2023-05-05 DIAGNOSIS — Z9889 Other specified postprocedural states: Secondary | ICD-10-CM

## 2023-05-05 NOTE — Telephone Encounter (Signed)
Spoke with Autumn Camacho. She states she has been doing good with the increase of gabapentin. Initially when she increased gabapentin and started the steroid dose pack she was feeling great. Now that is weaning off steroids and taking last dose today she is experiencing tingling in her right leg and left foot but denies nerve pain. She was having difficulty sleeping due to symptoms but started using lidocaine patch and that decreased some of her discomfort.     She would like to increase gabapentin to 400mg  tid so get her tingling better under control so that she doesn't have to use her walker. She will start the gabapentin 400mg  tid. She reports she has enough of the medication.     Scheduled for MRI L spine today and would like to know if she should schedule a f/u appt to discuss results or if we will reach out to her regarding results.

## 2023-05-05 NOTE — Telephone Encounter (Signed)
Heidi from [facility] is following up on call from Marella Bile, stating that Dr. Mora Bellman Farooqui's said it is okay to increase the gabapentin if it is helping the patient.     CCR unable to connect to clinic due to extension for provider not listed on SM.     Please contact Heidi at [phone]

## 2023-05-06 NOTE — Telephone Encounter (Signed)
Update per Silvana Newness:        Plan: RN updated pt via [facility] w/the above. Routing to front desk for scheduling f/up to review imaging.

## 2023-05-06 NOTE — Telephone Encounter (Signed)
The patient is calling to schedule telemedicine/phone visit. CCR unable to offer phone visit. Please advise

## 2023-05-07 NOTE — Telephone Encounter (Signed)
Pt called and LVM and requested assistance with getting scheduled for f/u appt.     Notified pt that schedulers will be reaching out to her most likely next week to get scheduled. She stated understanding.

## 2023-05-10 ENCOUNTER — Telehealth (HOSPITAL_BASED_OUTPATIENT_CLINIC_OR_DEPARTMENT_OTHER): Payer: Self-pay | Admitting: Neurological Surgery

## 2023-05-10 NOTE — Telephone Encounter (Signed)
Received VM from pt on RN direct line requesting to schedule appt to f/up on recent imaging results.     On chart review, RN see's appt for 05/12/2023 w/Anderson, ARNP scheduled.    Plan: RN sent [facility] to message to confirm needs have been addressed.

## 2023-05-11 ENCOUNTER — Telehealth: Payer: Self-pay | Admitting: Rheumatology

## 2023-05-11 ENCOUNTER — Other Ambulatory Visit (HOSPITAL_BASED_OUTPATIENT_CLINIC_OR_DEPARTMENT_OTHER): Payer: Self-pay | Admitting: Rheumatology

## 2023-05-11 DIAGNOSIS — M0579 Rheumatoid arthritis with rheumatoid factor of multiple sites without organ or systems involvement: Secondary | ICD-10-CM

## 2023-05-11 NOTE — Telephone Encounter (Signed)
error 

## 2023-05-11 NOTE — Progress Notes (Signed)
Distant Site Telemedicine Encounter  I conducted this encounter via secure, live, face-to-face video conference with the patient. I reviewed the risks and benefits of telemedicine as pertinent to this visit and the patient agreed to proceed.    Provider Location: On-site location (clinic, hospital, on-site office)  Patient Location: At home    Present with patient: No one else present         CHIEF OF COMPLAINT  Autumn Camacho is a 57 year old female PMH RA who underwent Lumbar 4-5 transforaminal lumbar interbody fusion and Lumbar 4-5 laminectomy with Dr. Nelson Chimes on 03/16/2023 and is here for imaging review of MRI Lumbar spine obtained for worsening radicular pain to RLE.      Surgical hx:  - L total knee replacement Dr. Colin Mulders 07/30/2020  - L knee revision total arthroplasty Dr. Colin Mulders 05/01/2020  - C7-T1 disctomy/ ACDF, Dr. Nelson Chimes. 08/22/2023  - C7 laminectomy, Dr. Deatra Canter, 09/06/2022  - L THA (12/22/22, Dr. Colin Mulders) with orthopedic surgery   - Lumbar 4-5 transforaminal lumbar interbody fusion and Lumbar 4-5 laminectomy Dr. Nelson Chimes 03/16/2023     Per Dr. Nelson Chimes Op note:  "presents with progressively worsening L4 radiculopathy secondary to L4-5 grade 1 spondylolisthesis and central canal stenosis. Symptoms have been worsening despite conservative management."     Pre-op symptoms: endorses numbness right leg lateral thigh, across anterior shin . Left foot only numbness. Reports right leg feels like it is weak because she has difficulty using with numbness. No urinary or bowel incontinence, no groin numbness.      Post-op symptoms: numbness is about half of pre-op and more on both sides instead of just the right.   Denies  saddle anesthesia, new weakness, loss of bowel/bladder    On 7/9, pt reported while using the toilet, after standing to pull up her pants she felt a sharp pain in her lower mid back which radiated down RLE, and a sensation that "something slipped" in her spine. It resolved itself after a few minutes of  rest and pain returned to baseline. However, since this episode, she experienced two episodes where while walking, she stepped forward with her right leg and felt sharp pain localized at incision site. No recurrence of slipping sensation.   Medrol Dose pack started 7/12    This visit is being conducted over the telephone at the patient's request: YES  Patient gives verbal consent to proceed and knows there may be a copay/deductible: YES    Interval History  Pain when she steps with right foot, or walking more confidently  Pain middle of lower back surgical incision area  Explosion and then it goes away- started before she had last appointment with Amil Amen  Right leg buckles occasionally   Knee bends reflexively   Numbness/tingling consistent since surgery, had upped dosage of gabapentin  Numbness starts at waist, all the way down  R>L numbness     Steroid pack helped in the beginning - but as she tapered down and now symptoms are returning  [place] saddle anesthesia, loss of bowel/bladder, feels generalized weakness    Taking gabapentin 400mg  TID    PT- has not started d/t symptoms   Moving out of state 8[place]5    OBJECTIVE    Physical Exam  Exam limited to tele exam with Cranial nerve and cognitive exam  AxOx3  EOMI, no ptosis  V2 numbness, V1/3 are intact, masseter strength symmetric   FS, TML  Speech is clear  Negative PD  MAE full  IMAGING  MRI L spine:    IMPRESSION  Interval new/more prominent inferior migration of the left L2-3 extruded disc component. L1-2 posterior central disc protrusion also appears to be interval new.  Postsurgical changes of L4-5 decompression, PSIF and T-PLIF. Fluid collection in the posterior paramedian soft tissues of indeterminate sterility.    Flexion/extension XR:  FINDINGS AND   IMPRESSION     Similar alignment with a right convex lumbar scoliosis and a grade 1 rightward L3-4 laterolisthesis compared to postop radiographs 03/18/2023 and 05/14/2023.     Similar appearance of the L4-5  interbody cage and L4-5 PSIF construct.  No loosening or failure the implants.     Similar disc degeneration at L2-3, L3-4 and L5-S1 above and below the construct.     No fractures.     Bilateral total hip arthroplasties.     Prior C7-T1 ACDF and laminectomy.    ASSESSMENT/PLAN  Autumn Camacho is a 57 year old female PMH RA who underwent Lumbar 4-5 transforaminal lumbar interbody fusion and Lumbar 4-5 laminectomy with Dr. Nelson Chimes on 03/16/2023 and is here for imaging review of MRI Lumbar spine obtained for worsening radicular pain to RLE. No diagnosis found.    MRI reviewed over telemedicine with patient today, which reveals extruded disks at left L2-3, and central L1-2. She is reporting symptoms of lumbar radicular pain to the right leg but it is unclear if this is due to the extruded disks. I suggested at this time that the disc extrusions are managed conservatively with PT, and she can discuss with her PCP increasing gabapentin dosing.   Radiology read the MRI with fluid collection posterior to the surgical site- I had her obtain CBC with diff, ESR, and CRP, all within normal limits, although ESR is upper limit of normal at 27. She is without fever/chills and my clinical suspension for infection is low.     Given her report of increased back pain I obtained flexion/extension XR revealing no dynamic instability, but with moderate facet arthropathy above and below the fusion. Also with no evidence of hardware failure.     I discussed her case with Dr. Nelson Chimes, agree to continue conservative management, suspicion of infection is low.     She would like a recommendation for a neurosurgeon in [place] to resume her care after her move- she will be close to [facility] medicine    05/14/2023 Addendum:  Patient reports radicular pain to RLE has been improved, as she has been careful with walking and avoiding positions that exaccerbate pain  [name] new weakness  Denies incisional swelling, opening, or  trouble with the incision  Denies fever/chills    However, if there are any further questions or concerns patient should contact our office for a sooner appointment.     Thank you very much for allowing me to participate in your care , please contact my office with any questions or concerns.   I spent a total of 30 minutes for the patient's care on the date of the service.           Author:  Federico Flake, ARNP  [facility]  [phone]

## 2023-05-12 ENCOUNTER — Ambulatory Visit: Payer: No Typology Code available for payment source | Admitting: Registered Nurse

## 2023-05-12 DIAGNOSIS — M545 Low back pain, unspecified: Secondary | ICD-10-CM

## 2023-05-12 DIAGNOSIS — M48062 Spinal stenosis, lumbar region with neurogenic claudication: Secondary | ICD-10-CM

## 2023-05-13 MED ORDER — CIMZIA (2 SYRINGE) 200 MG/ML SC PSKT
200.0000 mg | PREFILLED_SYRINGE | SUBCUTANEOUS | 2 refills | Status: AC
Start: 2023-05-13 — End: ?

## 2023-05-14 ENCOUNTER — Ambulatory Visit (HOSPITAL_COMMUNITY): Payer: Self-pay

## 2023-05-14 ENCOUNTER — Other Ambulatory Visit (HOSPITAL_BASED_OUTPATIENT_CLINIC_OR_DEPARTMENT_OTHER): Payer: Self-pay | Admitting: Registered Nurse

## 2023-05-14 ENCOUNTER — Ambulatory Visit
Admission: RE | Admit: 2023-05-14 | Discharge: 2023-05-14 | Disposition: A | Discharge status: Home / Self Care | Payer: No Typology Code available for payment source | Attending: Neuroradiology | Admitting: Neuroradiology

## 2023-05-14 ENCOUNTER — Ambulatory Visit (HOSPITAL_BASED_OUTPATIENT_CLINIC_OR_DEPARTMENT_OTHER)
Admit: 2023-05-14 | Discharge: 2023-05-14 | Disposition: A | Discharge status: Home / Self Care | Payer: No Typology Code available for payment source | Source: Home / Self Care

## 2023-05-14 DIAGNOSIS — M48062 Spinal stenosis, lumbar region with neurogenic claudication: Secondary | ICD-10-CM

## 2023-05-14 DIAGNOSIS — M545 Low back pain, unspecified: Secondary | ICD-10-CM

## 2023-05-14 LAB — CBC, DIFF
% Basophils: 0 %
% Eosinophils: 2 %
% Immature Granulocytes: 0 %
% Lymphocytes: 25 %
% Monocytes: 8 %
% Neutrophils: 65 %
% Nucleated RBC: 0 %
Absolute Eosinophil Count: 0.16 10*3/uL (ref 0.00–0.50)
Absolute Lymphocyte Count: 1.73 10*3/uL (ref 1.00–4.80)
Basophils: 0.02 10*3/uL (ref 0.00–0.20)
Hematocrit: 38 % (ref 36.0–45.0)
Hemoglobin: 11.9 g/dL (ref 11.5–15.5)
Immature Granulocytes: 0.02 10*3/uL (ref 0.00–0.05)
MCH: 28 pg (ref 27.3–33.6)
MCHC: 31.1 g/dL — ABNORMAL LOW (ref 32.2–36.5)
MCV: 90 fL (ref 81–98)
Monocytes: 0.55 10*3/uL (ref 0.00–0.80)
Neutrophils: 4.43 10*3/uL (ref 1.80–7.00)
Nucleated RBC: 0 10*3/uL
Platelet Count: 244 10*3/uL (ref 150–400)
RBC: 4.25 10*6/uL (ref 3.80–5.00)
RDW-CV: 16.3 % — ABNORMAL HIGH (ref 11.0–14.5)
WBC: 6.91 10*3/uL (ref 4.3–10.0)

## 2023-05-14 LAB — C_REACTIVE PROTEIN: C_Reactive Protein: 3.5 mg/L (ref 0.0–10.0)

## 2023-05-14 LAB — SED RATE: Erythrocyte Sedimentation Rate: 27 mm/h — ABNORMAL HIGH (ref 0–20)

## 2023-05-18 ENCOUNTER — Encounter (HOSPITAL_BASED_OUTPATIENT_CLINIC_OR_DEPARTMENT_OTHER): Payer: Self-pay | Admitting: Registered Nurse

## 2023-05-18 NOTE — Telephone Encounter (Signed)
Called insurance to follow up on p2p, CT authorized   Authorization # [id number]

## 2023-05-19 ENCOUNTER — Encounter (HOSPITAL_BASED_OUTPATIENT_CLINIC_OR_DEPARTMENT_OTHER): Payer: Self-pay

## 2023-05-20 ENCOUNTER — Ambulatory Visit (HOSPITAL_COMMUNITY): Payer: Medicare Other

## 2023-05-20 ENCOUNTER — Ambulatory Visit
Admission: RE | Admit: 2023-05-20 | Discharge: 2023-05-20 | Disposition: A | Discharge status: Home / Self Care | Payer: Medicare Other | Attending: Diagnostic Radiology | Admitting: Diagnostic Radiology

## 2023-05-20 DIAGNOSIS — M48062 Spinal stenosis, lumbar region with neurogenic claudication: Secondary | ICD-10-CM | POA: Insufficient documentation

## 2023-05-26 ENCOUNTER — Encounter (HOSPITAL_BASED_OUTPATIENT_CLINIC_OR_DEPARTMENT_OTHER): Payer: Self-pay | Admitting: Rheumatology

## 2023-05-26 DIAGNOSIS — M0579 Rheumatoid arthritis with rheumatoid factor of multiple sites without organ or systems involvement: Secondary | ICD-10-CM

## 2023-05-26 MED ORDER — LEFLUNOMIDE 20 MG OR TABS
20.0000 mg | ORAL_TABLET | Freq: Every day | ORAL | 0 refills | Status: AC
Start: 2023-05-26 — End: ?

## 2023-05-26 NOTE — Telephone Encounter (Signed)
Request to forward leflunomide RX to:    [facility][id number] Jacky Kindle [phone]]

## 2023-06-16 ENCOUNTER — Ambulatory Visit: Payer: No Typology Code available for payment source | Admitting: Neurological Surgery

## 2023-06-16 DIAGNOSIS — Z9889 Other specified postprocedural states: Secondary | ICD-10-CM

## 2023-06-16 DIAGNOSIS — M4316 Spondylolisthesis, lumbar region: Secondary | ICD-10-CM

## 2023-06-16 NOTE — Progress Notes (Signed)
[facility]  Provider: Rudy Jew, MD  Visit Date: 06/16/2023    Distant Site Telemedicine Encounter  I conducted this encounter via secure, live, face-to-face video conference with the patient. I reviewed the risks and benefits of telemedicine as pertinent to this visit and the patient agreed to proceed.    Provider Location: On-site location (clinic, hospital, on-site office)  Patient Location: At home   Present with patient: No one else present            HPI:  Autumn Camacho is a 57 year old female with neurosurgical hx of C7-T1 discectomy and ACDF Nelson Chimes, 11/'24), C7 laminectomy Deatra Canter, 11/'23), and most recently, L4-5 TLIF and L4-5 laminectomy with Dr. Nelson Chimes on 03/16/23.       Back pain is better  Increased gabapanetin 400 TID  Still having trouble walking independently    Starting PT in a few weeks.    IMAGING:   Imaging Results:  CT L Spine wo Contrast  Narrative: EXAMINATION:   CT L SPINE W/O CONTRAST    CLINICAL INDICATION:  s/p spinal fusion    TECHNIQUE:  CT Lumbar Spine without contrast:  Postop (N-[id number])    Contiguous  axial  sections were obtained through the lumbar spine  2 levels above to 2 levels below the fusion hardware.  Scan range was  [T10] through [sacrum].  Sagittal and coronal reformatted images were performed. Patient age specific scan parameters were used for radiation exposure    Superior Extent: L2  Inferior Extent:  Sacrum.    COMPARISON:  CT of lumbar spine the 20 eighth, 2024    FINDINGS:  As before, patient is status post posterior fusion L4-L5 with metallic disc spacer, L4 laminectomy.Marland Kitchen  Position of the hardware is unchanged.  Trace anterolisthesis L4 on L5, as before.    Compared to the previous study, gas is no longer seen in the L4-L5 disc space.  Extent of bridging bone is difficult to assess because of body habitus and metallic artifact.  Disc height loss at L3-L4 and at L5-S1 are similar.  Impression: 1. Compared to immediate  postoperative imaging Mar 16, 2023, resolution of intradiscal gas seen at L4-L5, consistent with progression towards fusion.        IMPRESSION / ASSESSMENT:    ICD-10-CM    1. Postoperative state  Z98.890       2. Spondylolisthesis at L4-L5 level  M43.16           Patient is recovering well from her lumbar surgery with imaging revealing interval progression of her interbody L4-5 fusion.  I counseled the patient I recommend she continue with physical therapy.  She will be continuing her care locally at [facility] as she recently moved to [facility].  She can follow-up with [place] as needed.    Prior EMR records reviewed as available and clinically relevant. All questions answered. Discharge and follow up instructions were discussed with the patient. The patient fully understands and is in agreement with the plan.    I spent a total of 25 minutes for the patient's care on the date of the service.            Portions of this chart were made using voice recognition software.

## 2023-06-17 ENCOUNTER — Telehealth (HOSPITAL_BASED_OUTPATIENT_CLINIC_OR_DEPARTMENT_OTHER): Payer: Self-pay | Admitting: Neurological Surgery

## 2023-06-17 NOTE — Telephone Encounter (Signed)
Received VM for pt who reports that she recently moved and requests records be sent to new facility. Reviewed process for requesting medical records. Sent pt a [facility] with form to release records and instructions for how to submit.     Pt will reach out if she needs any further assistance.

## 2023-07-13 DIAGNOSIS — M069 Rheumatoid arthritis, unspecified: Secondary | ICD-10-CM | POA: Diagnosis not present

## 2023-07-13 DIAGNOSIS — Z23 Encounter for immunization: Secondary | ICD-10-CM | POA: Diagnosis not present

## 2023-07-20 ENCOUNTER — Telehealth (HOSPITAL_BASED_OUTPATIENT_CLINIC_OR_DEPARTMENT_OTHER): Payer: Self-pay | Admitting: Neurological Surgery

## 2023-07-20 NOTE — Telephone Encounter (Signed)
Pt LVM on RN line requesting information on requesting medical records.     RN returned call and spoke w/pt. Pt notes the [place] out of state and trying to get imaging sent to new clinic. Pt states they completed paperwork and a disc was sent to new location, however the outside clinic was unable to get the images on the disc to upload. RN provided File Room contact information. Pt thankful for CB.

## 2023-07-23 ENCOUNTER — Other Ambulatory Visit: Payer: Self-pay | Admitting: Family Medicine

## 2023-07-23 DIAGNOSIS — Z1231 Encounter for screening mammogram for malignant neoplasm of breast: Secondary | ICD-10-CM

## 2023-07-28 ENCOUNTER — Ambulatory Visit: Payer: Medicare Other | Attending: Family Medicine

## 2023-07-28 ENCOUNTER — Other Ambulatory Visit: Payer: Self-pay

## 2023-07-28 DIAGNOSIS — M5459 Other low back pain: Secondary | ICD-10-CM

## 2023-07-28 DIAGNOSIS — M25562 Pain in left knee: Secondary | ICD-10-CM | POA: Insufficient documentation

## 2023-07-28 DIAGNOSIS — G8929 Other chronic pain: Secondary | ICD-10-CM

## 2023-07-28 DIAGNOSIS — M0579 Rheumatoid arthritis with rheumatoid factor of multiple sites without organ or systems involvement: Secondary | ICD-10-CM | POA: Diagnosis not present

## 2023-07-28 DIAGNOSIS — M79605 Pain in left leg: Secondary | ICD-10-CM

## 2023-07-28 DIAGNOSIS — R252 Cramp and spasm: Secondary | ICD-10-CM | POA: Diagnosis not present

## 2023-07-28 NOTE — Therapy (Addendum)
OUTPATIENT PHYSICAL THERAPY THORACOLUMBAR EVALUATION   Patient Name: Caitlin Wagner MRN: [id number] DOB:1966/06/27, 57 y.o., female Today's Date: 07/28/2023  END OF SESSION:  PT End of Session - 07/28/23 1204     Visit Number 1    Date for PT Re-Evaluation 09/22/23    Authorization Type [employer]    PT Start Time 1145    PT Stop Time 1230    PT Time Calculation (min) 45 min    Activity Tolerance Patient tolerated treatment well    Behavior During Therapy [facility] for tasks assessed/performed             Past Medical History:  Diagnosis Date   Arthritis    RA   ASCUS of cervix with negative high risk HPV 10/2017   High risk HPV infection 03/2012   HSV (herpes simplex virus) anogenital infection 12/2016   LGSIL (low grade squamous intraepithelial dysplasia) 2013/2017   03/2012 positive high risk HPV,  09/2015 with negative high-risk HPV   Rheumatoid arthritis(714.0)    Spinal stenosis    Past Surgical History:  Procedure Laterality Date   feet reconstruction surgery     bilateral    I & D KNEE WITH POLY EXCHANGE Left 04/11/2018   Procedure: REVISION LEFT TOTAL KNEE- POLY EXCHANGE;  Surgeon: Samson Frederic, MD;  Location: [facility] OR;  Service: Orthopedics;  Laterality: Left;  Needs RNFA   TONSILLECTOMY  age 89   TOTAL KNEE ARTHROPLASTY Left 03/22/2017   Procedure: TOTAL KNEE ARTHROPLASTY w/ Navigation;  Surgeon: Samson Frederic, MD;  Location: [facility] OR;  Service: Orthopedics;  Laterality: Left;   TOTAL KNEE ARTHROPLASTY Right 05/10/2017   Procedure: TOTAL KNEE ARTHROPLASTY;  Surgeon: Samson Frederic, MD;  Location: [facility] OR;  Service: Orthopedics;  Laterality: Right;   Patient Active Problem List   Diagnosis Date Noted   Failed total knee, left, initial encounter (HCC) 04/11/2018   Degenerative arthritis of right knee 05/10/2017   Degenerative joint disease of right knee 05/10/2017   Rheumatoid arthritis (HCC)     PCP: Barbie Banner, MD   REFERRING PROVIDER: Stamey, Verda Cumins,  FNP  REFERRING DIAG:  Diagnosis  M06.9 (ICD-10-CM) - Rheumatoid arthritis involving multiple sites, unspecified whether rheumatoid factor present    Rationale for Evaluation and Treatment: Rehabilitation  THERAPY DIAG:  Other low back pain  Pain in left leg  Cramp and spasm  Rheumatoid arthritis involving multiple sites with positive rheumatoid factor (HCC)  Chronic pain of left knee  ONSET DATE: 07/27/2023  SUBJECTIVE:  SUBJECTIVE STATEMENT: Patient reports she has been diagnosed with RA for approx 20 years.  She has undergone multiple joint replacements and fusion surgeries.  Her two most recent surgeries include lumbar fusion and left hip replacement.  She was not able to do any PT after her lumbar fusion before she needed her hip replacement.  She feels she is losing the ability to be functional due to the severity of the pain and mobility issues.  She understands that she has developed tightness and weakness from not being able to do much in the way of activity for the past few months.  She hopes to be able to stay functional and care for herself.    PERTINENT HISTORY:  RA x 20 years  PAIN:  Are you having pain? Yes: NPRS scale: 10/10 Pain location: all over Pain description: nerve pain and RA Aggravating factors: cold fronts coming through, any activity during RA flare Relieving factors: meds, rest  PRECAUTIONS: Other: RA for 20 years, multiple deformities and surgeries, ulnar drift  RED FLAGS: None   WEIGHT BEARING RESTRICTIONS: No  FALLS:  Has patient fallen in last 6 months? No  LIVING ENVIRONMENT: Lives with: lives with their family Lives in: House/apartment Stairs: No Has following equipment at home: Single point cane  OCCUPATION: disabled  PLOF: Independent,  Independent with basic ADLs, Independent with household mobility with device, Independent with community mobility with device, Independent with homemaking with ambulation, Independent with gait, Independent with transfers, and Needs assistance with homemaking  PATIENT GOALS: She hopes to be able to stay functional and care for herself.   NEXT MD VISIT: prn  OBJECTIVE:  Note: Objective measures were completed at Evaluation unless otherwise noted.  DIAGNOSTIC FINDINGS:  na  Functional screening:   Oswestry : 36/50= 72%  SCREENING FOR RED FLAGS: Bowel or bladder incontinence: No Spinal tumors: No Cauda equina syndrome: No Compression fracture: No Abdominal aneurysm: No  COGNITION: Overall cognitive status: Within functional limits for tasks assessed     SENSATION: WFL  MUSCLE LENGTH: Hamstrings: Right approx 45 degrees deg; Left approx 45 deg Thomas test: Right pos; Left pos  POSTURE: rounded shoulders, increased lumbar lordosis, right pelvic obliquity, and acquired scoliosis  PALPATION: na  LUMBAR ROM:  Initial eval:   Held on testing due to severe pain: will assess as pain level decreases  AROM eval  Flexion   Extension   Right lateral flexion   Left lateral flexion   Right rotation   Left rotation    (Blank rows = not tested)  LOWER EXTREMITY ROM:     Numerous joint limitations and ROM deficits but she does have full overhead reach bilateral shoulders  LOWER EXTREMITY MMT:    Generally 4 to 4+ out of 5 in available range  LUMBAR SPECIAL TESTS:  Deferred due to recent surgery  FUNCTIONAL TESTS:  5 times sit to stand: 31.12 sec Timed up and go (TUG): 23.62 sec  GAIT: Distance walked: 30 feet Assistive device utilized: Single point cane Level of assistance: SBA Comments: antalgic, abnormal  TODAY'S TREATMENT:  DATE:  07/28/23 Initial eval completed     PATIENT EDUCATION:  Education details: Educated on benefits of hydrostatic effects of water therapy to assist with joint pain during exercise Person educated: Patient Education method: Explanation Education comprehension: verbalized understanding  HOME EXERCISE PROGRAM: na  ASSESSMENT:  CLINICAL IMPRESSION: Patient is a 57 y.o. female who was seen today for physical therapy evaluation and treatment for post lumbar fusion and left THA.   She has underlying severe RA with multiple joint deformities and joint replacements.  She is fused at c spine as well.  She presents with decreased ROM and poor flexibility but generally good strength.  She would benefit from skilled PT in aquatics program initially to gain some control of her pain.  We will transition to land and pool after 4 weeks to see if she will be able to tolerate land based therapy with more manageable pain.    OBJECTIVE IMPAIRMENTS: decreased mobility, difficulty walking, decreased ROM, decreased strength, increased fascial restrictions, increased muscle spasms, impaired flexibility, impaired UE functional use, postural dysfunction, and pain.   ACTIVITY LIMITATIONS: carrying, lifting, bending, sitting, standing, squatting, sleeping, stairs, transfers, bed mobility, bathing, toileting, dressing, and hygiene/grooming  PARTICIPATION LIMITATIONS: meal prep, cleaning, laundry, driving, shopping, community activity, yard work, and church  PERSONAL FACTORS: Time since onset of injury/illness/exacerbation and 1-2 comorbidities: RA  are also affecting patient's functional outcome.   REHAB POTENTIAL: Fair Severity of RA and joint issues along with multiple sugeries may impede progress  CLINICAL DECISION MAKING: Evolving/moderate complexity  EVALUATION COMPLEXITY: Moderate   GOALS: Goals reviewed with patient? Yes  SHORT TERM GOALS: Target date: 08/25/2023   Pain report to be no greater than 6/10   Baseline: Goal status: INITIAL  2.  Patient will be independent with initial HEP of aquatic exercises Baseline:  Goal status: INITIAL  3.  Patient to be able to walk independently from parking lot [facility] to pool independently without rest breaks using any assistive device  Baseline:  Goal status: INITIAL    LONG TERM GOALS: Target date: 09/22/2023   Pain report to be no greater than 4/10  Baseline:  Goal status: INITIAL  2.  Patient to be independent with advanced HEP  Baseline:  Goal status: INITIAL  3.  Patient to be able to ascend and descend steps without pain or no greater than 2/10  Baseline:  Goal status: INITIAL  4.  Patient to be able to bend, stoop and squat with pain no greater than 2/10  Baseline:  Goal status: INITIAL  5.  Patient to be able to sleep through the night  Baseline:  Goal status: INITIAL  6.  Patient to report 85% improvement in overall symptoms Baseline:  Goal status: INITIAL  PLAN:  PT FREQUENCY: 1-2x/week  PT DURATION: 8 weeks  PLANNED INTERVENTIONS: 97146- PT Re-evaluation, 97110-Therapeutic exercises, 97530- Therapeutic activity, [id number]- Neuromuscular re-education, 97535- Self Care, [id number]- Manual therapy, [id number]- Gait training, [phone]- Canalith repositioning, [id number]- Aquatic Therapy, 97760- Splinting, 97014- Electrical stimulation (unattended), [id number]- Electrical stimulation (manual), 97016- Vasopneumatic device, [id number]- Ultrasound, [id number]- Ionotophoresis 4mg /ml Dexamethasone, Patient/Family education, Balance training, Stair training, Taping, Dry Needling, Joint mobilization, Spinal mobilization, Scar mobilization, Vestibular training, Visual/preceptual remediation/compensation, DME instructions, Cryotherapy, Moist heat, Therapeutic exercises, Therapeutic activity, Neuromuscular re-education, Gait training, and Self Care.  PLAN FOR NEXT SESSION: Aquatic therapy x 4 weeks   Marcos Peloso B. Annesha Delgreco, PT 07/28/23 9:08 PM [facility]  Specialty Rehab Services [address] [place], [place] [address] Phone # [phone]  Fax [phone]

## 2023-07-29 ENCOUNTER — Encounter (HOSPITAL_BASED_OUTPATIENT_CLINIC_OR_DEPARTMENT_OTHER): Payer: Self-pay | Admitting: Physical Therapy

## 2023-07-29 ENCOUNTER — Ambulatory Visit (HOSPITAL_BASED_OUTPATIENT_CLINIC_OR_DEPARTMENT_OTHER): Payer: Medicare Other | Attending: Family Medicine | Admitting: Physical Therapy

## 2023-07-29 DIAGNOSIS — M5459 Other low back pain: Secondary | ICD-10-CM | POA: Diagnosis not present

## 2023-07-29 DIAGNOSIS — M069 Rheumatoid arthritis, unspecified: Secondary | ICD-10-CM | POA: Diagnosis not present

## 2023-07-29 DIAGNOSIS — M79605 Pain in left leg: Secondary | ICD-10-CM | POA: Diagnosis not present

## 2023-07-29 DIAGNOSIS — R252 Cramp and spasm: Secondary | ICD-10-CM | POA: Insufficient documentation

## 2023-07-29 DIAGNOSIS — M0579 Rheumatoid arthritis with rheumatoid factor of multiple sites without organ or systems involvement: Secondary | ICD-10-CM

## 2023-07-29 NOTE — Therapy (Signed)
OUTPATIENT PHYSICAL THERAPY THORACOLUMBAR TREATMENT   Patient Name: Caitlin Wagner MRN: [id number] DOB:June 18, 1966, 57 y.o., female Today's Date: 07/29/2023  END OF SESSION:  PT End of Session - 07/29/23 1025     Visit Number 2    Date for PT Re-Evaluation 09/22/23    Authorization Type [employer]    PT Start Time 1020    PT Stop Time 1105    PT Time Calculation (min) 45 min    Activity Tolerance Patient tolerated treatment well    Behavior During Therapy [facility] for tasks assessed/performed             Past Medical History:  Diagnosis Date   Arthritis    RA   ASCUS of cervix with negative high risk HPV 10/2017   High risk HPV infection 03/2012   HSV (herpes simplex virus) anogenital infection 12/2016   LGSIL (low grade squamous intraepithelial dysplasia) 2013/2017   03/2012 positive high risk HPV,  09/2015 with negative high-risk HPV   Rheumatoid arthritis(714.0)    Spinal stenosis    Past Surgical History:  Procedure Laterality Date   feet reconstruction surgery     bilateral    I & D KNEE WITH POLY EXCHANGE Left 04/11/2018   Procedure: REVISION LEFT TOTAL KNEE- POLY EXCHANGE;  Surgeon: Samson Frederic, MD;  Location: [facility] OR;  Service: Orthopedics;  Laterality: Left;  Needs RNFA   TONSILLECTOMY  age 77   TOTAL KNEE ARTHROPLASTY Left 03/22/2017   Procedure: TOTAL KNEE ARTHROPLASTY w/ Navigation;  Surgeon: Samson Frederic, MD;  Location: [facility] OR;  Service: Orthopedics;  Laterality: Left;   TOTAL KNEE ARTHROPLASTY Right 05/10/2017   Procedure: TOTAL KNEE ARTHROPLASTY;  Surgeon: Samson Frederic, MD;  Location: [facility] OR;  Service: Orthopedics;  Laterality: Right;   Patient Active Problem List   Diagnosis Date Noted   Failed total knee, left, initial encounter (HCC) 04/11/2018   Degenerative arthritis of right knee 05/10/2017   Degenerative joint disease of right knee 05/10/2017   Rheumatoid arthritis (HCC)     PCP: Barbie Banner, MD   REFERRING PROVIDER: Stamey, Verda Cumins,  FNP  REFERRING DIAG:  Diagnosis  M06.9 (ICD-10-CM) - Rheumatoid arthritis involving multiple sites, unspecified whether rheumatoid factor present    Rationale for Evaluation and Treatment: Rehabilitation  THERAPY DIAG:  Other low back pain  Pain in left leg  Cramp and spasm  Rheumatoid arthritis involving multiple sites with positive rheumatoid factor (HCC)  ONSET DATE: 07/27/2023  SUBJECTIVE:  SUBJECTIVE STATEMENT: Pt realized she wasn't taking her Gabapentin correctly.  Now she is taking 400mg  every 8 hrs and her pain is more under control.  She reports she knows how to swim and "use to love swimming".    From initial evaluation: Patient reports she has been diagnosed with RA for approx 20 years.  She has undergone multiple joint replacements and fusion surgeries.  Her two most recent surgeries include lumbar fusion and left hip replacement.  She was not able to do any PT after her lumbar fusion before she needed her hip replacement.  She feels she is losing the ability to be functional due to the severity of the pain and mobility issues.  She understands that she has developed tightness and weakness from not being able to do much in the way of activity for the past few months.  She hopes to be able to stay functional and care for herself.    PERTINENT HISTORY:  RA x 20 years  PAIN:  Are you having pain? Yes: NPRS scale: 3/10 Pain location: lower back  Pain description: nerve pain and RA Aggravating factors: cold fronts coming through, any activity during RA flare Relieving factors: meds, rest  PRECAUTIONS: Other: RA for 20 years, multiple deformities and surgeries, ulnar drift  RED FLAGS: None   WEIGHT BEARING RESTRICTIONS: No  FALLS:  Has patient fallen in last 6 months? No  LIVING  ENVIRONMENT: Lives with: lives with their family Lives in: House/apartment Stairs: No Has following equipment at home: Single point cane  OCCUPATION: disabled  PLOF: Independent, Independent with basic ADLs, Independent with household mobility with device, Independent with community mobility with device, Independent with homemaking with ambulation, Independent with gait, Independent with transfers, and Needs assistance with homemaking  PATIENT GOALS: She hopes to be able to stay functional and care for herself.   NEXT MD VISIT: prn  OBJECTIVE:  Note: Objective measures were completed at Evaluation unless otherwise noted.  DIAGNOSTIC FINDINGS:  na    SCREENING FOR RED FLAGS: Bowel or bladder incontinence: No Spinal tumors: No Cauda equina syndrome: No Compression fracture: No Abdominal aneurysm: No  COGNITION: Overall cognitive status: Within functional limits for tasks assessed     SENSATION: WFL  MUSCLE LENGTH: Hamstrings: Right approx 45 degrees deg; Left approx 45 deg Thomas test: Right pos; Left pos  POSTURE: rounded shoulders, increased lumbar lordosis, right pelvic obliquity, and acquired scoliosis  PALPATION: na  LUMBAR ROM:  Initial eval:   Held on testing due to severe pain: will assess as pain level decreases  AROM eval  Flexion   Extension   Right lateral flexion   Left lateral flexion   Right rotation   Left rotation    (Blank rows = not tested)  LOWER EXTREMITY ROM:     Numerous joint limitations and ROM deficits but she does have full overhead reach bilateral shoulders  LOWER EXTREMITY MMT:    Generally 4 to 4+ out of 5 in available range  LUMBAR SPECIAL TESTS:  Deferred due to recent surgery  FUNCTIONAL TESTS:  5 times sit to stand: 31.12 sec Timed up and go (TUG): 23.62 sec  GAIT: Distance walked: 30 feet Assistive device utilized: Single point cane Level of assistance: SBA Comments: antalgic, abnormal  TODAY'S TREATMENT:  DATE: 07/29/23 Pt seen for aquatic therapy today.  Treatment took place in water 3.5-4.75 ft in depth at the [facility] pool. Temp of water was 91.  Pt entered/exited the pool via stairs independently with bilat rail.  Pt feels better facing lap pool due to leg length difference.   * UE on yellow hand floats:  walking forward/ backwards with cues for vertical trunk, reduced but even step length, - multiple laps;  side stepping R/L * without support- side stepping with arm addct/ abdct x 2 laps * straddling yellow noodle with breast stroke arms- cycling with legs -> UE support on hand floats -> legs dangling -> suspended hip abdct/ addct, return to cycling  * standard stance with TrA set and short hollow noodle pull down to thighs x 10 ( some pain in L elbow) * farmer carry with short hollow noodle at side - walking forward/ backward * breast stroke across width of pool x 4  Pt requires the buoyancy and hydrostatic pressure of water for support, and to offload joints by unweighting joint load by at least 50 % in navel deep water and by at least 75-80% in chest to neck deep water.  Viscosity of the water is needed for resistance of strengthening. Water current perturbations provides challenge to standing balance requiring increased core activation.     PATIENT EDUCATION:  Education details: intro to aquatic therapy.  Person educated: Patient Education method: Explanation Education comprehension: verbalized understanding  HOME EXERCISE PROGRAM: TBD  ASSESSMENT:  CLINICAL IMPRESSION: Pt is safe and independent in aquatic setting and able to take direction from therapist on deck.  Her balance and stride length with gait in water was improved with use of UE on hand floats.  Pt reported reduction of pain while exercising in water.  Plan to begin creating aquatic  HEP for her to utilize at local pool.  Will issue list of area pools next visit.  Plan to trial hip flexor stretch and leg swings next visit.  Goals are ongoing.   From initial evaluation: Patient is a 57 y.o. female who was seen today for physical therapy evaluation and treatment for post lumbar fusion and left THA.   She has underlying severe RA with multiple joint deformities and joint replacements.  She is fused at c spine as well.  She presents with decreased ROM and poor flexibility but generally good strength.  She would benefit from skilled PT in aquatics program initially to gain some control of her pain.  We will transition to land and pool after 4 weeks to see if she will be able to tolerate land based therapy with more manageable pain.    OBJECTIVE IMPAIRMENTS: decreased mobility, difficulty walking, decreased ROM, decreased strength, increased fascial restrictions, increased muscle spasms, impaired flexibility, impaired UE functional use, postural dysfunction, and pain.   ACTIVITY LIMITATIONS: carrying, lifting, bending, sitting, standing, squatting, sleeping, stairs, transfers, bed mobility, bathing, toileting, dressing, and hygiene/grooming  PARTICIPATION LIMITATIONS: meal prep, cleaning, laundry, driving, shopping, community activity, yard work, and church  PERSONAL FACTORS: Time since onset of injury/illness/exacerbation and 1-2 comorbidities: RA  are also affecting patient's functional outcome.   REHAB POTENTIAL: Fair Severity of RA and joint issues along with multiple sugeries may impede progress  CLINICAL DECISION MAKING: Evolving/moderate complexity  EVALUATION COMPLEXITY: Moderate   GOALS: Goals reviewed with patient? Yes  SHORT TERM GOALS: Target date: 08/25/2023   Pain report to be no greater than 6/10  Baseline: Goal status: INITIAL  2.  Patient will be independent with initial HEP of aquatic exercises Baseline:  Goal status: INITIAL  3.  Patient to be able  to walk independently from parking lot of [facility] to pool independently without rest breaks using any assistive device  Baseline:  Goal status: INITIAL    LONG TERM GOALS: Target date: 09/22/2023   Pain report to be no greater than 4/10  Baseline:  Goal status: INITIAL  2.  Patient to be independent with advanced HEP  Baseline:  Goal status: INITIAL  3.  Patient to be able to ascend and descend steps without pain or no greater than 2/10  Baseline:  Goal status: INITIAL  4.  Patient to be able to bend, stoop and squat with pain no greater than 2/10  Baseline:  Goal status: INITIAL  5.  Patient to be able to sleep through the night  Baseline:  Goal status: INITIAL  6.  Patient to report 85% improvement in overall symptoms Baseline:  Goal status: INITIAL  PLAN:  PT FREQUENCY: 1-2x/week  PT DURATION: 8 weeks  PLANNED INTERVENTIONS: 97146- PT Re-evaluation, 97110-Therapeutic exercises, 97530- Therapeutic activity, [id number]- Neuromuscular re-education, 97535- Self Care, 16109- Manual therapy, [id number]- Gait training, [phone]- Canalith repositioning, [id number]- Aquatic Therapy, 97760- Splinting, 97014- Electrical stimulation (unattended), [id number]- Electrical stimulation (manual), 97016- Vasopneumatic device, [id number]- Ultrasound, [id number]- Ionotophoresis 4mg /ml Dexamethasone, Patient/Family education, Balance training, Stair training, Taping, Dry Needling, Joint mobilization, Spinal mobilization, Scar mobilization, Vestibular training, Visual/preceptual remediation/compensation, DME instructions, Cryotherapy, Moist heat, Therapeutic exercises, Therapeutic activity, Neuromuscular re-education, Gait training, and Self Care.  PLAN FOR NEXT SESSION: Aquatic therapy x 4 weeks  Mayer Camel, PTA 07/29/23 1:11 PM [facility] [address] [place], [place], [address] Phone: [phone]   Fax:  [phone]

## 2023-08-03 ENCOUNTER — Ambulatory Visit: Payer: Self-pay | Admitting: Family

## 2023-08-04 ENCOUNTER — Encounter: Payer: Self-pay | Admitting: Physical Therapy

## 2023-08-04 ENCOUNTER — Ambulatory Visit: Payer: Medicare Other | Admitting: Physical Therapy

## 2023-08-04 ENCOUNTER — Telehealth (HOSPITAL_BASED_OUTPATIENT_CLINIC_OR_DEPARTMENT_OTHER): Payer: Self-pay | Admitting: Neurological Surgery

## 2023-08-04 ENCOUNTER — Encounter (HOSPITAL_BASED_OUTPATIENT_CLINIC_OR_DEPARTMENT_OTHER): Payer: Self-pay | Admitting: Registered Nurse

## 2023-08-04 DIAGNOSIS — R252 Cramp and spasm: Secondary | ICD-10-CM | POA: Diagnosis not present

## 2023-08-04 DIAGNOSIS — M0579 Rheumatoid arthritis with rheumatoid factor of multiple sites without organ or systems involvement: Secondary | ICD-10-CM

## 2023-08-04 DIAGNOSIS — G8929 Other chronic pain: Secondary | ICD-10-CM | POA: Diagnosis not present

## 2023-08-04 DIAGNOSIS — M5459 Other low back pain: Secondary | ICD-10-CM

## 2023-08-04 DIAGNOSIS — M79605 Pain in left leg: Secondary | ICD-10-CM

## 2023-08-04 DIAGNOSIS — M25562 Pain in left knee: Secondary | ICD-10-CM | POA: Diagnosis not present

## 2023-08-04 DIAGNOSIS — M5416 Radiculopathy, lumbar region: Secondary | ICD-10-CM

## 2023-08-04 NOTE — Therapy (Signed)
OUTPATIENT PHYSICAL THERAPY THORACOLUMBAR TREATMENT   Patient Name: Caitlin Wagner MRN: [id number] DOB:April 03, 1966, 57 y.o., female Today's Date: 08/04/2023  END OF SESSION:  PT End of Session - 08/04/23 1000     Visit Number 3    Date for PT Re-Evaluation 09/22/23    Authorization Type [employer]    PT Start Time 1000    PT Stop Time 1045    PT Time Calculation (min) 45 min    Activity Tolerance Patient tolerated treatment well    Behavior During Therapy [facility] for tasks assessed/performed             Past Medical History:  Diagnosis Date   Arthritis    RA   ASCUS of cervix with negative high risk HPV 10/2017   High risk HPV infection 03/2012   HSV (herpes simplex virus) anogenital infection 12/2016   LGSIL (low grade squamous intraepithelial dysplasia) 2013/2017   03/2012 positive high risk HPV,  09/2015 with negative high-risk HPV   Rheumatoid arthritis(714.0)    Spinal stenosis    Past Surgical History:  Procedure Laterality Date   feet reconstruction surgery     bilateral    I & D KNEE WITH POLY EXCHANGE Left 04/11/2018   Procedure: REVISION LEFT TOTAL KNEE- POLY EXCHANGE;  Surgeon: Samson Frederic, MD;  Location: [facility] OR;  Service: Orthopedics;  Laterality: Left;  Needs RNFA   TONSILLECTOMY  age 18   TOTAL KNEE ARTHROPLASTY Left 03/22/2017   Procedure: TOTAL KNEE ARTHROPLASTY w/ Navigation;  Surgeon: Samson Frederic, MD;  Location: [facility] OR;  Service: Orthopedics;  Laterality: Left;   TOTAL KNEE ARTHROPLASTY Right 05/10/2017   Procedure: TOTAL KNEE ARTHROPLASTY;  Surgeon: Samson Frederic, MD;  Location: [facility] OR;  Service: Orthopedics;  Laterality: Right;   Patient Active Problem List   Diagnosis Date Noted   Failed total knee, left, initial encounter (HCC) 04/11/2018   Degenerative arthritis of right knee 05/10/2017   Degenerative joint disease of right knee 05/10/2017   Rheumatoid arthritis (HCC)     PCP: Barbie Banner, MD   REFERRING PROVIDER: Stamey, Verda Cumins,  FNP  REFERRING DIAG:  Diagnosis  M06.9 (ICD-10-CM) - Rheumatoid arthritis involving multiple sites, unspecified whether rheumatoid factor present    Rationale for Evaluation and Treatment: Rehabilitation  THERAPY DIAG:  Other low back pain  Pain in left leg  Rheumatoid arthritis involving multiple sites with positive rheumatoid factor (HCC)  Cramp and spasm  Chronic pain of left knee  ONSET DATE: 07/27/2023  SUBJECTIVE:  SUBJECTIVE STATEMENT: I felt great for a few days after my first aquatic session. Then I decided to pain a chair which messed my neck all up and I have had very terrible pain and numbness since.   From initial evaluation: Patient reports she has been diagnosed with RA for approx 20 years.  She has undergone multiple joint replacements and fusion surgeries.  Her two most recent surgeries include lumbar fusion and left hip replacement.  She was not able to do any PT after her lumbar fusion before she needed her hip replacement.  She feels she is losing the ability to be functional due to the severity of the pain and mobility issues.  She understands that she has developed tightness and weakness from not being able to do much in the way of activity for the past few months.  She hopes to be able to stay functional and care for herself.    PERTINENT HISTORY:  RA x 20 years  PAIN:  Are you having pain? Yes: NPRS scale: 3/10 Pain location: lower back  Pain description: nerve pain and RA Aggravating factors: cold fronts coming through, any activity during RA flare Relieving factors: meds, rest  PRECAUTIONS: Other: RA for 20 years, multiple deformities and surgeries, ulnar drift  RED FLAGS: None   WEIGHT BEARING RESTRICTIONS: No  FALLS:  Has patient fallen in last 6 months?  No  LIVING ENVIRONMENT: Lives with: lives with their family Lives in: House/apartment Stairs: No Has following equipment at home: Single point cane  OCCUPATION: disabled  PLOF: Independent, Independent with basic ADLs, Independent with household mobility with device, Independent with community mobility with device, Independent with homemaking with ambulation, Independent with gait, Independent with transfers, and Needs assistance with homemaking  PATIENT GOALS: She hopes to be able to stay functional and care for herself.   NEXT MD VISIT: prn  OBJECTIVE:  Note: Objective measures were completed at Evaluation unless otherwise noted.  DIAGNOSTIC FINDINGS:  na    SCREENING FOR RED FLAGS: Bowel or bladder incontinence: No Spinal tumors: No Cauda equina syndrome: No Compression fracture: No Abdominal aneurysm: No  COGNITION: Overall cognitive status: Within functional limits for tasks assessed     SENSATION: WFL  MUSCLE LENGTH: Hamstrings: Right approx 45 degrees deg; Left approx 45 deg Thomas test: Right pos; Left pos  POSTURE: rounded shoulders, increased lumbar lordosis, right pelvic obliquity, and acquired scoliosis  PALPATION: na  LUMBAR ROM:  Initial eval:   Held on testing due to severe pain: will assess as pain level decreases  AROM eval  Flexion   Extension   Right lateral flexion   Left lateral flexion   Right rotation   Left rotation    (Blank rows = not tested)  LOWER EXTREMITY ROM:     Numerous joint limitations and ROM deficits but she does have full overhead reach bilateral shoulders  LOWER EXTREMITY MMT:    Generally 4 to 4+ out of 5 in available range  LUMBAR SPECIAL TESTS:  Deferred due to recent surgery  FUNCTIONAL TESTS:  5 times sit to stand: 31.12 sec Timed up and go (TUG): 23.62 sec  GAIT: Distance walked: 30 feet Assistive device utilized: Single point cane Level of assistance: SBA Comments: antalgic, abnormal  TODAY'S  TREATMENT:     08/04/23: Pt arrives for aquatic physical therapy. Treatment took place in 3.5-5.5 feet of water. Water temperature was 91 degrees F. Pt entered the pool via stairs step to step today with heavy use of  the rails. PTA in front of pt for safety. Pt requires buoyancy of water for support and to offload joints with strengthening exercises.  Seated water bench with 75% submersion Pt performed seated LE AROM exercises 20x in all planes, concurrent discussion of current status and what our goals would be for the treatment session.  75% depth for water walking holding single buoy UE wts for balance 6x in each direction. VC for more upright posture as she tends to stoop forward. Attempted forward leg kicks holding onto same wts but pt was too unstable. She did better holding onto the wall (steady object) Bil 10x. Hip abduction Bil facing the wall 10x holding on for support. Underwater bicycle using yellow noodle in horseback 10 lengths with SBA and mod asst to get off noodle.                                                                                                                              DATE: 07/29/23 Pt seen for aquatic therapy today.  Treatment took place in water 3.5-4.75 ft in depth at the [facility] pool. Temp of water was 91.  Pt entered/exited the pool via stairs independently with bilat rail.  Pt feels better facing lap pool due to leg length difference.   * UE on yellow hand floats:  walking forward/ backwards with cues for vertical trunk, reduced but even step length, - multiple laps;  side stepping R/L * without support- side stepping with arm addct/ abdct x 2 laps * straddling yellow noodle with breast stroke arms- cycling with legs -> UE support on hand floats -> legs dangling -> suspended hip abdct/ addct, return to cycling  * standard stance with TrA set and short hollow noodle pull down to thighs x 10 ( some pain in L elbow) * farmer carry with short hollow  noodle at side - walking forward/ backward * breast stroke across width of pool x 4  Pt requires the buoyancy and hydrostatic pressure of water for support, and to offload joints by unweighting joint load by at least 50 % in navel deep water and by at least 75-80% in chest to neck deep water.  Viscosity of the water is needed for resistance of strengthening. Water current perturbations provides challenge to standing balance requiring increased core activation.     PATIENT EDUCATION:  Education details: intro to aquatic therapy.  Person educated: Patient Education method: Explanation Education comprehension: verbalized understanding  HOME EXERCISE PROGRAM: TBD  ASSESSMENT:  CLINICAL IMPRESSION: Pt reports very positive feedback from her body after her first treatment last week having multiple days of less or no pain. She did have a significant exacerbation after painting a chair this week with her cervical spine. Pt tolerates all aquatic exercises well, no increases in pain. Pt does require today holing onto solid object in order to perform hip kicks.   From initial evaluation: Patient is a 57 y.o. female who was seen  today for physical therapy evaluation and treatment for post lumbar fusion and left THA.   She has underlying severe RA with multiple joint deformities and joint replacements.  She is fused at c spine as well.  She presents with decreased ROM and poor flexibility but generally good strength.  She would benefit from skilled PT in aquatics program initially to gain some control of her pain.  We will transition to land and pool after 4 weeks to see if she will be able to tolerate land based therapy with more manageable pain.    OBJECTIVE IMPAIRMENTS: decreased mobility, difficulty walking, decreased ROM, decreased strength, increased fascial restrictions, increased muscle spasms, impaired flexibility, impaired UE functional use, postural dysfunction, and pain.   ACTIVITY  LIMITATIONS: carrying, lifting, bending, sitting, standing, squatting, sleeping, stairs, transfers, bed mobility, bathing, toileting, dressing, and hygiene/grooming  PARTICIPATION LIMITATIONS: meal prep, cleaning, laundry, driving, shopping, community activity, yard work, and church  PERSONAL FACTORS: Time since onset of injury/illness/exacerbation and 1-2 comorbidities: RA  are also affecting patient's functional outcome.   REHAB POTENTIAL: Fair Severity of RA and joint issues along with multiple sugeries may impede progress  CLINICAL DECISION MAKING: Evolving/moderate complexity  EVALUATION COMPLEXITY: Moderate   GOALS: Goals reviewed with patient? Yes  SHORT TERM GOALS: Target date: 08/25/2023   Pain report to be no greater than 6/10  Baseline: Goal status: INITIAL  2.  Patient will be independent with initial HEP of aquatic exercises Baseline:  Goal status: INITIAL  3.  Patient to be able to walk independently from parking lot of [facility] to pool independently without rest breaks using any assistive device  Baseline:  Goal status: INITIAL    LONG TERM GOALS: Target date: 09/22/2023   Pain report to be no greater than 4/10  Baseline:  Goal status: INITIAL  2.  Patient to be independent with advanced HEP  Baseline:  Goal status: INITIAL  3.  Patient to be able to ascend and descend steps without pain or no greater than 2/10  Baseline:  Goal status: INITIAL  4.  Patient to be able to bend, stoop and squat with pain no greater than 2/10  Baseline:  Goal status: INITIAL  5.  Patient to be able to sleep through the night  Baseline:  Goal status: INITIAL  6.  Patient to report 85% improvement in overall symptoms Baseline:  Goal status: INITIAL  PLAN:  PT FREQUENCY: 1-2x/week  PT DURATION: 8 weeks  PLANNED INTERVENTIONS: 97146- PT Re-evaluation, 97110-Therapeutic exercises, 97530- Therapeutic activity, 97112- Neuromuscular re-education, 97535-  Self Care, 14782- Manual therapy, [id number]- Gait training, [phone]- Canalith repositioning, [id number]- Aquatic Therapy, [phone]- Splinting, 97014- Electrical stimulation (unattended), [id number]- Electrical stimulation (manual), [id number]- Vasopneumatic device, [id number]- Ultrasound, [id number]- Ionotophoresis 4mg /ml Dexamethasone, Patient/Family education, Balance training, Stair training, Taping, Dry Needling, Joint mobilization, Spinal mobilization, Scar mobilization, Vestibular training, Visual/preceptual remediation/compensation, DME instructions, Cryotherapy, Moist heat, Therapeutic exercises, Therapeutic activity, Neuromuscular re-education, Gait training, and Self Care.  PLAN FOR NEXT SESSION: Aquatic therapy x 4 weeks  Ane Payment, PTA 08/04/23 7:33 PM   [facility] [address] [place], [place], [address] Phone: [phone]   Fax:  [phone]

## 2023-08-04 NOTE — Telephone Encounter (Signed)
Pt requesting referral be sent to [facility] fax # [phone].     This is a duplicate request - please see additional 08/04/2023 OV note for continuation

## 2023-08-04 NOTE — Telephone Encounter (Signed)
Patient called requesting referral be faxed to [facility] in [place], [place]. She has moved to the [employer] [place] and would like spinal care be completed locally near home. Thank you.    Hx:L4-5 TLIF and L4-5 laminectomy with Dr. Nelson Chimes on 03/16/23.        [facility]  Ph: [phone]  Fax: [phone]

## 2023-08-04 NOTE — Telephone Encounter (Signed)
Please see additional 08/04/2023 TE for continuation.

## 2023-08-04 NOTE — Telephone Encounter (Signed)
Autumn Camacho, ARNP placed NSG referral. RN faxed referral to requested location. Received confirmation fax sent successfully.     [facility]  Ph: [phone]  Fax: [phone]    Updated pt via [facility].

## 2023-08-09 ENCOUNTER — Encounter: Payer: Self-pay | Admitting: Physical Therapy

## 2023-08-12 ENCOUNTER — Encounter (HOSPITAL_BASED_OUTPATIENT_CLINIC_OR_DEPARTMENT_OTHER): Payer: Self-pay | Admitting: Physical Therapy

## 2023-08-12 ENCOUNTER — Ambulatory Visit (HOSPITAL_BASED_OUTPATIENT_CLINIC_OR_DEPARTMENT_OTHER): Payer: Medicare Other | Admitting: Physical Therapy

## 2023-08-12 DIAGNOSIS — M069 Rheumatoid arthritis, unspecified: Secondary | ICD-10-CM | POA: Diagnosis not present

## 2023-08-12 DIAGNOSIS — M5459 Other low back pain: Secondary | ICD-10-CM

## 2023-08-12 DIAGNOSIS — M79605 Pain in left leg: Secondary | ICD-10-CM

## 2023-08-12 DIAGNOSIS — R252 Cramp and spasm: Secondary | ICD-10-CM | POA: Diagnosis not present

## 2023-08-12 DIAGNOSIS — M0579 Rheumatoid arthritis with rheumatoid factor of multiple sites without organ or systems involvement: Secondary | ICD-10-CM

## 2023-08-12 NOTE — Therapy (Signed)
OUTPATIENT PHYSICAL THERAPY THORACOLUMBAR TREATMENT   Patient Name: Caitlin Wagner MRN: [id number] DOB:Aug 13, 1966, 57 y.o., female Today's Date: 08/12/2023  END OF SESSION:  PT End of Session - 08/12/23 1616     Visit Number 4    Date for PT Re-Evaluation 09/22/23    Authorization Type [employer]    PT Start Time 1618    PT Stop Time 1658    PT Time Calculation (min) 40 min    Behavior During Therapy [facility] for tasks assessed/performed             Past Medical History:  Diagnosis Date   Arthritis    RA   ASCUS of cervix with negative high risk HPV 10/2017   High risk HPV infection 03/2012   HSV (herpes simplex virus) anogenital infection 12/2016   LGSIL (low grade squamous intraepithelial dysplasia) 2013/2017   03/2012 positive high risk HPV,  09/2015 with negative high-risk HPV   Rheumatoid arthritis(714.0)    Spinal stenosis    Past Surgical History:  Procedure Laterality Date   feet reconstruction surgery     bilateral    I & D KNEE WITH POLY EXCHANGE Left 04/11/2018   Procedure: REVISION LEFT TOTAL KNEE- POLY EXCHANGE;  Surgeon: Samson Frederic, MD;  Location: [facility] OR;  Service: Orthopedics;  Laterality: Left;  Needs RNFA   TONSILLECTOMY  age 67   TOTAL KNEE ARTHROPLASTY Left 03/22/2017   Procedure: TOTAL KNEE ARTHROPLASTY w/ Navigation;  Surgeon: Samson Frederic, MD;  Location: [facility] OR;  Service: Orthopedics;  Laterality: Left;   TOTAL KNEE ARTHROPLASTY Right 05/10/2017   Procedure: TOTAL KNEE ARTHROPLASTY;  Surgeon: Samson Frederic, MD;  Location: [facility] OR;  Service: Orthopedics;  Laterality: Right;   Patient Active Problem List   Diagnosis Date Noted   Failed total knee, left, initial encounter (HCC) 04/11/2018   Degenerative arthritis of right knee 05/10/2017   Degenerative joint disease of right knee 05/10/2017   Rheumatoid arthritis (HCC)     PCP: Barbie Banner, MD   REFERRING PROVIDER: Stamey, Verda Cumins, FNP  REFERRING DIAG:  Diagnosis  M06.9 (ICD-10-CM) -  Rheumatoid arthritis involving multiple sites, unspecified whether rheumatoid factor present    Rationale for Evaluation and Treatment: Rehabilitation  THERAPY DIAG:  Other low back pain  Pain in left leg  Rheumatoid arthritis involving multiple sites with positive rheumatoid factor (HCC)  ONSET DATE: 07/27/2023  SUBJECTIVE:                                                                                                                                                                                           SUBJECTIVE STATEMENT: "  I've had two of my best days since doing aquatic therapy." Pt reports she was able to get up without using arms yesterday, for first time in months.   From initial evaluation: Patient reports she has been diagnosed with RA for approx 20 years.  She has undergone multiple joint replacements and fusion surgeries.  Her two most recent surgeries include lumbar fusion and left hip replacement.  She was not able to do any PT after her lumbar fusion before she needed her hip replacement.  She feels she is losing the ability to be functional due to the severity of the pain and mobility issues.  She understands that she has developed tightness and weakness from not being able to do much in the way of activity for the past few months.  She hopes to be able to stay functional and care for herself.    PERTINENT HISTORY:  RA x 20 years  PAIN:  Are you having pain? Yes: NPRS scale: 10/10 Pain location: neck/lower back  Pain description: nerve pain and RA Aggravating factors: cold fronts coming through, any activity during RA flare Relieving factors: meds, rest  PRECAUTIONS: Other: RA for 20 years, multiple deformities and surgeries, ulnar drift  RED FLAGS: None   WEIGHT BEARING RESTRICTIONS: No  FALLS:  Has patient fallen in last 6 months? No  LIVING ENVIRONMENT: Lives with: lives with their family Lives in: House/apartment Stairs: No Has following equipment  at home: Single point cane  OCCUPATION: disabled  PLOF: Independent, Independent with basic ADLs, Independent with household mobility with device, Independent with community mobility with device, Independent with homemaking with ambulation, Independent with gait, Independent with transfers, and Needs assistance with homemaking  PATIENT GOALS: She hopes to be able to stay functional and care for herself.   NEXT MD VISIT: prn  OBJECTIVE:  Note: Objective measures were completed at Evaluation unless otherwise noted.  DIAGNOSTIC FINDINGS:  na    SCREENING FOR RED FLAGS: Bowel or bladder incontinence: No Spinal tumors: No Cauda equina syndrome: No Compression fracture: No Abdominal aneurysm: No  COGNITION: Overall cognitive status: Within functional limits for tasks assessed     SENSATION: WFL  MUSCLE LENGTH: Hamstrings: Right approx 45 degrees deg; Left approx 45 deg Thomas test: Right pos; Left pos  POSTURE: rounded shoulders, increased lumbar lordosis, right pelvic obliquity, and acquired scoliosis  PALPATION: na  LUMBAR ROM:  Initial eval:   Held on testing due to severe pain: will assess as pain level decreases  AROM eval  Flexion   Extension   Right lateral flexion   Left lateral flexion   Right rotation   Left rotation    (Blank rows = not tested)  LOWER EXTREMITY ROM:     Numerous joint limitations and ROM deficits but she does have full overhead reach bilateral shoulders  LOWER EXTREMITY MMT:    Generally 4 to 4+ out of 5 in available range  LUMBAR SPECIAL TESTS:  Deferred due to recent surgery  FUNCTIONAL TESTS:  5 times sit to stand: 31.12 sec Timed up and go (TUG): 23.62 sec  GAIT: Distance walked: 30 feet Assistive device utilized: Single point cane Level of assistance: SBA Comments: antalgic, abnormal  TODAY'S TREATMENT:    DATE: 08/12/23 Pt seen for aquatic therapy today.  Treatment took place in water 3.5-4.75 ft in depth at the  [facility] pool. Temp of water was 91.  Pt entered/exited the pool via stairs independently with bilat rail.  Pt feels better facing lap  pool due to leg length difference.   * without support: walking forward/ backwards with cues for vertical trunk, reduced but even step length, - multiple laps * queen nods to tolerance (slight rotation with nod; painful to L); mild Rt levator stretch  * side stepping with arm addct/ abdct x 3 laps * farmer carry with short hollow noodle at side - walking forward/ backward * standard stance with TrA set and short hollow noodle pull down to thighs x 6 ( stopped due to pain in L elbow) * straddling yellow noodle with breast stroke arms- cycling with legs suspended hip abdct/ addct, return to cycling  * seated on bench in water with feet on blue step: STS with forward arm reach, cues for hip hinge and controlled descent x 6   08/04/23: Pt arrives for aquatic physical therapy. Treatment took place in 3.5-5.5 feet of water. Water temperature was 91 degrees F. Pt entered the pool via stairs step to step today with heavy use of the rails. PTA in front of pt for safety. Pt requires buoyancy of water for support and to offload joints with strengthening exercises.  Seated water bench with 75% submersion Pt performed seated LE AROM exercises 20x in all planes, concurrent discussion of current status and what our goals would be for the treatment session.  75% depth for water walking holding single buoy UE wts for balance 6x in each direction. VC for more upright posture as she tends to stoop forward. Attempted forward leg kicks holding onto same wts but pt was too unstable. She did better holding onto the wall (steady object) Bil 10x. Hip abduction Bil facing the wall 10x holding on for support. Underwater bicycle using yellow noodle in horseback 10 lengths with SBA and mod asst to get off noodle.                                                                                                                               DATE: 07/29/23 Pt seen for aquatic therapy today.  Treatment took place in water 3.5-4.75 ft in depth at the [facility] pool. Temp of water was 91.  Pt entered/exited the pool via stairs independently with bilat rail.  Pt feels better facing lap pool due to leg length difference.   * UE on yellow hand floats:  walking forward/ backwards with cues for vertical trunk, reduced but even step length, - multiple laps;  side stepping R/L * without support- side stepping with arm addct/ abdct x 2 laps * straddling yellow noodle with breast stroke arms- cycling with legs -> UE support on hand floats -> legs dangling -> suspended hip abdct/ addct, return to cycling  * standard stance with TrA set and short hollow noodle pull down to thighs x 10 ( some pain in L elbow) * farmer carry with short hollow noodle at side - walking forward/ backward * breast stroke across width of pool x 4  Pt requires the buoyancy and hydrostatic pressure of water for support, and to offload joints by unweighting joint load by at least 50 % in navel deep water and by at least 75-80% in chest to neck deep water.  Viscosity of the water is needed for resistance of strengthening. Water current perturbations provides challenge to standing balance requiring increased core activation.     PATIENT EDUCATION:  Education details: intro to aquatic therapy.  Person educated: Patient Education method: Explanation Education comprehension: verbalized understanding  HOME EXERCISE PROGRAM: TBD  ASSESSMENT:  CLINICAL IMPRESSION: Pt reported gradual reduction to 2/10 from 10/10.  She had limited tolerance for short noodle pull downs due to L elbow pain, but otherwise tolerated all other exercises well.  Positive response to aquatic therapy thus far.  Pt issued list of local pools; will begin to explore options for after d/c from aquatic therapy.     From initial  evaluation: Patient is a 57 y.o. female who was seen today for physical therapy evaluation and treatment for post lumbar fusion and left THA.   She has underlying severe RA with multiple joint deformities and joint replacements.  She is fused at c spine as well.  She presents with decreased ROM and poor flexibility but generally good strength.  She would benefit from skilled PT in aquatics program initially to gain some control of her pain.  We will transition to land and pool after 4 weeks to see if she will be able to tolerate land based therapy with more manageable pain.    OBJECTIVE IMPAIRMENTS: decreased mobility, difficulty walking, decreased ROM, decreased strength, increased fascial restrictions, increased muscle spasms, impaired flexibility, impaired UE functional use, postural dysfunction, and pain.   ACTIVITY LIMITATIONS: carrying, lifting, bending, sitting, standing, squatting, sleeping, stairs, transfers, bed mobility, bathing, toileting, dressing, and hygiene/grooming  PARTICIPATION LIMITATIONS: meal prep, cleaning, laundry, driving, shopping, community activity, yard work, and church  PERSONAL FACTORS: Time since onset of injury/illness/exacerbation and 1-2 comorbidities: RA  are also affecting patient's functional outcome.   REHAB POTENTIAL: Fair Severity of RA and joint issues along with multiple sugeries may impede progress  CLINICAL DECISION MAKING: Evolving/moderate complexity  EVALUATION COMPLEXITY: Moderate   GOALS: Goals reviewed with patient? Yes  SHORT TERM GOALS: Target date: 08/25/2023   Pain report to be no greater than 6/10  Baseline: Goal status: INITIAL  2.  Patient will be independent with initial HEP of aquatic exercises Baseline:  Goal status: INITIAL  3.  Patient to be able to walk independently from parking lot [facility] to pool independently without rest breaks using any assistive device  Baseline:  Goal status: INITIAL    LONG  TERM GOALS: Target date: 09/22/2023   Pain report to be no greater than 4/10  Baseline:  Goal status: INITIAL  2.  Patient to be independent with advanced HEP  Baseline:  Goal status: INITIAL  3.  Patient to be able to ascend and descend steps without pain or no greater than 2/10  Baseline:  Goal status: INITIAL  4.  Patient to be able to bend, stoop and squat with pain no greater than 2/10  Baseline:  Goal status: INITIAL  5.  Patient to be able to sleep through the night  Baseline:  Goal status: INITIAL  6.  Patient to report 85% improvement in overall symptoms Baseline:  Goal status: INITIAL  PLAN:  PT FREQUENCY: 1-2x/week  PT DURATION: 8 weeks  PLANNED INTERVENTIONS: 97146- PT Re-evaluation, 97110-Therapeutic exercises, 97530-  Therapeutic activity, [id number]- Neuromuscular re-education, [id number]- Self Care, [id number]- Manual therapy, [id number]- Gait training, [id number]- Canalith repositioning, [id number]- Aquatic Therapy, [phone]- Splinting, 97014- Electrical stimulation (unattended), [id number]- Electrical stimulation (manual), [id number]- Vasopneumatic device, [id number]- Ultrasound, [id number]- Ionotophoresis 4mg /ml Dexamethasone, Patient/Family education, Balance training, Stair training, Taping, Dry Needling, Joint mobilization, Spinal mobilization, Scar mobilization, Vestibular training, Visual/preceptual remediation/compensation, DME instructions, Cryotherapy, Moist heat, Therapeutic exercises, Therapeutic activity, Neuromuscular re-education, Gait training, and Self Care.  PLAN FOR NEXT SESSION: Aquatic therapy x 4 weeks  Mayer Camel, PTA 08/12/23 5:07 PM [facility] [address] [place], [place], [address] Phone: [phone]   Fax:  [phone]

## 2023-08-16 ENCOUNTER — Encounter (HOSPITAL_BASED_OUTPATIENT_CLINIC_OR_DEPARTMENT_OTHER): Payer: Self-pay | Admitting: Physical Therapy

## 2023-08-16 ENCOUNTER — Ambulatory Visit (HOSPITAL_BASED_OUTPATIENT_CLINIC_OR_DEPARTMENT_OTHER): Payer: Medicare Other | Admitting: Physical Therapy

## 2023-08-16 DIAGNOSIS — M5459 Other low back pain: Secondary | ICD-10-CM | POA: Diagnosis not present

## 2023-08-16 DIAGNOSIS — R252 Cramp and spasm: Secondary | ICD-10-CM

## 2023-08-16 DIAGNOSIS — M0579 Rheumatoid arthritis with rheumatoid factor of multiple sites without organ or systems involvement: Secondary | ICD-10-CM

## 2023-08-16 DIAGNOSIS — M069 Rheumatoid arthritis, unspecified: Secondary | ICD-10-CM | POA: Diagnosis not present

## 2023-08-16 DIAGNOSIS — M79605 Pain in left leg: Secondary | ICD-10-CM

## 2023-08-16 NOTE — Therapy (Signed)
OUTPATIENT PHYSICAL THERAPY THORACOLUMBAR TREATMENT   Patient Name: Caitlin Wagner MRN: [id number] DOB:[birth year], 57 y.o., female Today's Date: 08/16/2023  END OF SESSION:  PT End of Session - 08/16/23 1511     Visit Number 5    Date for PT Re-Evaluation 09/22/23    Authorization Type [employer]    PT Start Time 1415    PT Stop Time 1455    PT Time Calculation (min) 40 min    Activity Tolerance Patient tolerated treatment well    Behavior During Therapy [facility] for tasks assessed/performed              Past Medical History:  Diagnosis Date   Arthritis    RA   ASCUS of cervix with negative high risk HPV 10/2017   High risk HPV infection 03/2012   HSV (herpes simplex virus) anogenital infection 12/2016   LGSIL (low grade squamous intraepithelial dysplasia) 2013/2017   03/2012 positive high risk HPV,  09/2015 with negative high-risk HPV   Rheumatoid arthritis(714.0)    Spinal stenosis    Past Surgical History:  Procedure Laterality Date   feet reconstruction surgery     bilateral    I & D KNEE WITH POLY EXCHANGE Left 04/11/2018   Procedure: REVISION LEFT TOTAL KNEE- POLY EXCHANGE;  Surgeon: Samson Frederic, MD;  Location: [facility] OR;  Service: Orthopedics;  Laterality: Left;  Needs RNFA   TONSILLECTOMY  age 7   TOTAL KNEE ARTHROPLASTY Left 03/22/2017   Procedure: TOTAL KNEE ARTHROPLASTY w/ Navigation;  Surgeon: Samson Frederic, MD;  Location: [facility] OR;  Service: Orthopedics;  Laterality: Left;   TOTAL KNEE ARTHROPLASTY Right 05/10/2017   Procedure: TOTAL KNEE ARTHROPLASTY;  Surgeon: Samson Frederic, MD;  Location: [facility] OR;  Service: Orthopedics;  Laterality: Right;   Patient Active Problem List   Diagnosis Date Noted   Failed total knee, left, initial encounter (HCC) 04/11/2018   Degenerative arthritis of right knee 05/10/2017   Degenerative joint disease of right knee 05/10/2017   Rheumatoid arthritis (HCC)     PCP: Barbie Banner, MD   REFERRING PROVIDER: Stamey, Verda Cumins,  FNP  REFERRING DIAG:  Diagnosis  M06.9 (ICD-10-CM) - Rheumatoid arthritis involving multiple sites, unspecified whether rheumatoid factor present    Rationale for Evaluation and Treatment: Rehabilitation  THERAPY DIAG:  Other low back pain  Pain in left leg  Cramp and spasm  Rheumatoid arthritis involving multiple sites with positive rheumatoid factor (HCC)  ONSET DATE: 07/27/2023  SUBJECTIVE:  SUBJECTIVE STATEMENT: Pt reports that she was able to walk a little bit faster down hall to pool today.  Neck is feeling better.     From initial evaluation: Patient reports she has been diagnosed with RA for approx 20 years.  She has undergone multiple joint replacements and fusion surgeries.  Her two most recent surgeries include lumbar fusion and left hip replacement.  She was not able to do any PT after her lumbar fusion before she needed her hip replacement.  She feels she is losing the ability to be functional due to the severity of the pain and mobility issues.  She understands that she has developed tightness and weakness from not being able to do much in the way of activity for the past few months.  She hopes to be able to stay functional and care for herself.    PERTINENT HISTORY:  RA x 20 years  PAIN:  Are you having pain? Yes: NPRS scale: 3/10 Pain location: lower back  Pain description: nerve pain and RA Aggravating factors: cold fronts coming through, any activity during RA flare Relieving factors: meds, rest  PRECAUTIONS: Other: RA for 20 years, multiple deformities and surgeries, ulnar drift  RED FLAGS: None   WEIGHT BEARING RESTRICTIONS: No  FALLS:  Has patient fallen in last 6 months? No  LIVING ENVIRONMENT: Lives with: lives with their family Lives in: House/apartment Stairs:  No Has following equipment at home: Single point cane  OCCUPATION: disabled  PLOF: Independent, Independent with basic ADLs, Independent with household mobility with device, Independent with community mobility with device, Independent with homemaking with ambulation, Independent with gait, Independent with transfers, and Needs assistance with homemaking  PATIENT GOALS: She hopes to be able to stay functional and care for herself.   NEXT MD VISIT: prn  OBJECTIVE:  Note: Objective measures were completed at Evaluation unless otherwise noted.  DIAGNOSTIC FINDINGS:  na    SCREENING FOR RED FLAGS: Bowel or bladder incontinence: No Spinal tumors: No Cauda equina syndrome: No Compression fracture: No Abdominal aneurysm: No  COGNITION: Overall cognitive status: Within functional limits for tasks assessed     SENSATION: WFL  MUSCLE LENGTH: Hamstrings: Right approx 45 degrees deg; Left approx 45 deg Thomas test: Right pos; Left pos  POSTURE: rounded shoulders, increased lumbar lordosis, right pelvic obliquity, and acquired scoliosis  PALPATION: na  LUMBAR ROM:  Initial eval:   Held on testing due to severe pain: will assess as pain level decreases  AROM eval  Flexion   Extension   Right lateral flexion   Left lateral flexion   Right rotation   Left rotation    (Blank rows = not tested)  LOWER EXTREMITY ROM:     Numerous joint limitations and ROM deficits but she does have full overhead reach bilateral shoulders  LOWER EXTREMITY MMT:    Generally 4 to 4+ out of 5 in available range  LUMBAR SPECIAL TESTS:  Deferred due to recent surgery  FUNCTIONAL TESTS:  5 times sit to stand: 31.12 sec Timed up and go (TUG): 23.62 sec  GAIT: Distance walked: 30 feet Assistive device utilized: Single point cane Level of assistance: SBA Comments: antalgic, abnormal  TODAY'S TREATMENT:    DATE: 08/16/23 Pt seen for aquatic therapy today.  Treatment took place in water  3.5-4.75 ft in depth at the [facility] pool. Temp of water was 91.  Pt entered/exited the pool via stairs independently with bilat rail.  Pt feels better facing lap pool due  to leg length difference.   * without support: walking forward/ backwards with cues for vertical trunk,3 laps * side stepping with arm addct/ abdct x 2 laps -> side step into squat x 1 lap * staggered stance with shoulder addct/ abdct, horiz abdct/ addct * wall push up x 2- not tolerated due to Lt elbow pain * Ai chi postures:  (~5 of each) contemplating, floating, uplifting, enclosing, folding, soothing, gathering, freeing * straddling yellow noodle with breast stroke arms- cycling with legs suspended hip abdct/ addct, return to cycling    DATE: 08/12/23 Pt seen for aquatic therapy today.  Treatment took place in water 3.5-4.75 ft in depth at the [facility] pool. Temp of water was 91.  Pt entered/exited the pool via stairs independently with bilat rail.  Pt feels better facing lap pool due to leg length difference.   * without support: walking forward/ backwards with cues for vertical trunk, reduced but even step length, - multiple laps * queen nods to tolerance (slight rotation with nod; painful to L); mild Rt levator stretch  * side stepping with arm addct/ abdct x 3 laps * farmer carry with short hollow noodle at side - walking forward/ backward * standard stance with TrA set and short hollow noodle pull down to thighs x 6 ( stopped due to pain in L elbow) * straddling yellow noodle with breast stroke arms- cycling with legs suspended hip abdct/ addct, return to cycling  * seated on bench in water with feet on blue step: STS with forward arm reach, cues for hip hinge and controlled descent x 6   08/04/23: Pt arrives for aquatic physical therapy. Treatment took place in 3.5-5.5 feet of water. Water temperature was 91 degrees F. Pt entered the pool via stairs step to step today with heavy use of the  rails. PTA in front of pt for safety. Pt requires buoyancy of water for support and to offload joints with strengthening exercises.  Seated water bench with 75% submersion Pt performed seated LE AROM exercises 20x in all planes, concurrent discussion of current status and what our goals would be for the treatment session.  75% depth for water walking holding single buoy UE wts for balance 6x in each direction. VC for more upright posture as she tends to stoop forward. Attempted forward leg kicks holding onto same wts but pt was too unstable. She did better holding onto the wall (steady object) Bil 10x. Hip abduction Bil facing the wall 10x holding on for support. Underwater bicycle using yellow noodle in horseback 10 lengths with SBA and mod asst to get off noodle.                                                                                                                              DATE: 07/29/23 Pt seen for aquatic therapy today.  Treatment took place in water 3.5-4.75 ft in depth at the [facility] pool. Temp of  water was 91.  Pt entered/exited the pool via stairs independently with bilat rail.  Pt feels better facing lap pool due to leg length difference.   * UE on yellow hand floats:  walking forward/ backwards with cues for vertical trunk, reduced but even step length, - multiple laps;  side stepping R/L * without support- side stepping with arm addct/ abdct x 2 laps * straddling yellow noodle with breast stroke arms- cycling with legs -> UE support on hand floats -> legs dangling -> suspended hip abdct/ addct, return to cycling  * standard stance with TrA set and short hollow noodle pull down to thighs x 10 ( some pain in L elbow) * farmer carry with short hollow noodle at side - walking forward/ backward * breast stroke across width of pool x 4  Pt requires the buoyancy and hydrostatic pressure of water for support, and to offload joints by unweighting joint load by at least 50  % in navel deep water and by at least 75-80% in chest to neck deep water.  Viscosity of the water is needed for resistance of strengthening. Water current perturbations provides challenge to standing balance requiring increased core activation.     PATIENT EDUCATION:  Education details: intro to aquatic therapy.  Person educated: Patient Education method: Explanation Education comprehension: verbalized understanding  HOME EXERCISE PROGRAM: TBD  ASSESSMENT:  CLINICAL IMPRESSION: Pt reported reduction of back pain to 0/10 during session. She reported some soreness in Lt hip flexor at end of session. Introduced Ai chi basic postures with good tolerance. Pt unable to tolerate floatation resistance due to Lt elbow pain. Pt is considering joining Hotel manager with daughter.  Goals are ongoing.    From initial evaluation: Patient is a 57 y.o. female who was seen today for physical therapy evaluation and treatment for post lumbar fusion and left THA.   She has underlying severe RA with multiple joint deformities and joint replacements.  She is fused at c spine as well.  She presents with decreased ROM and poor flexibility but generally good strength.  She would benefit from skilled PT in aquatics program initially to gain some control of her pain.  We will transition to land and pool after 4 weeks to see if she will be able to tolerate land based therapy with more manageable pain.    OBJECTIVE IMPAIRMENTS: decreased mobility, difficulty walking, decreased ROM, decreased strength, increased fascial restrictions, increased muscle spasms, impaired flexibility, impaired UE functional use, postural dysfunction, and pain.   ACTIVITY LIMITATIONS: carrying, lifting, bending, sitting, standing, squatting, sleeping, stairs, transfers, bed mobility, bathing, toileting, dressing, and hygiene/grooming  PARTICIPATION LIMITATIONS: meal prep, cleaning, laundry, driving, shopping, community activity, yard work, and  church  PERSONAL FACTORS: Time since onset of injury/illness/exacerbation and 1-2 comorbidities: RA  are also affecting patient's functional outcome.   REHAB POTENTIAL: Fair Severity of RA and joint issues along with multiple sugeries may impede progress  CLINICAL DECISION MAKING: Evolving/moderate complexity  EVALUATION COMPLEXITY: Moderate   GOALS: Goals reviewed with patient? Yes  SHORT TERM GOALS: Target date: 08/25/2023   Pain report to be no greater than 6/10  Baseline: Goal status: INITIAL  2.  Patient will be independent with initial HEP of aquatic exercises Baseline:  Goal status: INITIAL  3.  Patient to be able to walk independently from [facility] to pool independently without rest breaks using any assistive device  Baseline:  Goal status: In progress - 08/16/23    LONG TERM  GOALS: Target date: 09/22/2023   Pain report to be no greater than 4/10  Baseline:  Goal status: INITIAL  2.  Patient to be independent with advanced HEP  Baseline:  Goal status: INITIAL  3.  Patient to be able to ascend and descend steps without pain or no greater than 2/10  Baseline:  Goal status: INITIAL  4.  Patient to be able to bend, stoop and squat with pain no greater than 2/10  Baseline:  Goal status: INITIAL  5.  Patient to be able to sleep through the night  Baseline:  Goal status: INITIAL  6.  Patient to report 85% improvement in overall symptoms Baseline:  Goal status: INITIAL  PLAN:  PT FREQUENCY: 1-2x/week  PT DURATION: 8 weeks  PLANNED INTERVENTIONS: 97146- PT Re-evaluation, 97110-Therapeutic exercises, 97530- Therapeutic activity, [id number]- Neuromuscular re-education, 97535- Self Care, 16109- Manual therapy, [id number]- Gait training, [phone]- Canalith repositioning, [id number]- Aquatic Therapy, [id number]- Splinting, 97014- Electrical stimulation (unattended), [id number]- Electrical stimulation (manual), [id number]- Vasopneumatic device, [id number]- Ultrasound,  [id number]- Ionotophoresis 4mg /ml Dexamethasone, Patient/Family education, Balance training, Stair training, Taping, Dry Needling, Joint mobilization, Spinal mobilization, Scar mobilization, Vestibular training, Visual/preceptual remediation/compensation, DME instructions, Cryotherapy, Moist heat, Therapeutic exercises, Therapeutic activity, Neuromuscular re-education, Gait training, and Self Care.  PLAN FOR NEXT SESSION: Aquatic therapy x 4 weeks  Mayer Camel, PTA 08/16/23 3:15 PM [facility] [address] [place], [place], [address] Phone: [phone]   Fax:  [phone]

## 2023-08-17 ENCOUNTER — Ambulatory Visit: Payer: Medicare Other

## 2023-08-19 ENCOUNTER — Encounter (HOSPITAL_BASED_OUTPATIENT_CLINIC_OR_DEPARTMENT_OTHER): Payer: Self-pay | Admitting: Physical Therapy

## 2023-08-19 ENCOUNTER — Ambulatory Visit (HOSPITAL_BASED_OUTPATIENT_CLINIC_OR_DEPARTMENT_OTHER): Payer: Medicare Other | Admitting: Physical Therapy

## 2023-08-19 DIAGNOSIS — M79605 Pain in left leg: Secondary | ICD-10-CM | POA: Diagnosis not present

## 2023-08-19 DIAGNOSIS — M0579 Rheumatoid arthritis with rheumatoid factor of multiple sites without organ or systems involvement: Secondary | ICD-10-CM

## 2023-08-19 DIAGNOSIS — R252 Cramp and spasm: Secondary | ICD-10-CM | POA: Diagnosis not present

## 2023-08-19 DIAGNOSIS — M5459 Other low back pain: Secondary | ICD-10-CM

## 2023-08-19 DIAGNOSIS — M069 Rheumatoid arthritis, unspecified: Secondary | ICD-10-CM | POA: Diagnosis not present

## 2023-08-19 NOTE — Therapy (Signed)
OUTPATIENT PHYSICAL THERAPY THORACOLUMBAR TREATMENT   Patient Name: Caitlin Wagner MRN: [id number] DOB:Nov 24, 1965, 57 y.o., female Today's Date: 08/19/2023  END OF SESSION:  PT End of Session - 08/19/23 1753     Visit Number 6    Date for PT Re-Evaluation 09/22/23    Authorization Type [employer]    PT Start Time 1615    PT Stop Time 1655    PT Time Calculation (min) 40 min    Activity Tolerance Patient tolerated treatment well    Behavior During Therapy [facility] for tasks assessed/performed             Past Medical History:  Diagnosis Date   Arthritis    RA   ASCUS of cervix with negative high risk HPV 10/2017   High risk HPV infection 03/2012   HSV (herpes simplex virus) anogenital infection 12/2016   LGSIL (low grade squamous intraepithelial dysplasia) 2013/2017   03/2012 positive high risk HPV,  09/2015 with negative high-risk HPV   Rheumatoid arthritis(714.0)    Spinal stenosis    Past Surgical History:  Procedure Laterality Date   feet reconstruction surgery     bilateral    I & D KNEE WITH POLY EXCHANGE Left 04/11/2018   Procedure: REVISION LEFT TOTAL KNEE- POLY EXCHANGE;  Surgeon: Samson Frederic, MD;  Location: [facility] OR;  Service: Orthopedics;  Laterality: Left;  Needs RNFA   TONSILLECTOMY  age 46   TOTAL KNEE ARTHROPLASTY Left 03/22/2017   Procedure: TOTAL KNEE ARTHROPLASTY w/ Navigation;  Surgeon: Samson Frederic, MD;  Location: [facility] OR;  Service: Orthopedics;  Laterality: Left;   TOTAL KNEE ARTHROPLASTY Right 05/10/2017   Procedure: TOTAL KNEE ARTHROPLASTY;  Surgeon: Samson Frederic, MD;  Location: [facility] OR;  Service: Orthopedics;  Laterality: Right;   Patient Active Problem List   Diagnosis Date Noted   Failed total knee, left, initial encounter (HCC) 04/11/2018   Degenerative arthritis of right knee 05/10/2017   Degenerative joint disease of right knee 05/10/2017   Rheumatoid arthritis (HCC)     PCP: Barbie Banner, MD   REFERRING PROVIDER: Stamey, Verda Cumins,  FNP  REFERRING DIAG:  Diagnosis  M06.9 (ICD-10-CM) - Rheumatoid arthritis involving multiple sites, unspecified whether rheumatoid factor present    Rationale for Evaluation and Treatment: Rehabilitation  THERAPY DIAG:  Other low back pain  Pain in left leg  Cramp and spasm  Rheumatoid arthritis involving multiple sites with positive rheumatoid factor (HCC)  ONSET DATE: 07/27/2023  SUBJECTIVE:  SUBJECTIVE STATEMENT: "On the days I don't move I have more pain."  Pt reports that she and her daughter joined [employer] this week. She reports she has had more swelling in joints (hands and feet). She states that when she arrived in parking lot today, her pain was a 10, but after resting on bench for 20 min, her pain has dropped to 6.    From initial evaluation: Patient reports she has been diagnosed with RA for approx 20 years.  She has undergone multiple joint replacements and fusion surgeries.  Her two most recent surgeries include lumbar fusion and left hip replacement.  She was not able to do any PT after her lumbar fusion before she needed her hip replacement.  She feels she is losing the ability to be functional due to the severity of the pain and mobility issues.  She understands that she has developed tightness and weakness from not being able to do much in the way of activity for the past few months.  She hopes to be able to stay functional and care for herself.    PERTINENT HISTORY:  RA x 20 years  PAIN:  Are you having pain? Yes: NPRS scale: 6/10 Pain location:generalized  Pain description: nerve pain and RA Aggravating factors: cold fronts coming through, any activity during RA flare Relieving factors: meds, rest  PRECAUTIONS: Other: RA for 20 years, multiple deformities and surgeries, ulnar  drift  RED FLAGS: None   WEIGHT BEARING RESTRICTIONS: No  FALLS:  Has patient fallen in last 6 months? No  LIVING ENVIRONMENT: Lives with: lives with their family Lives in: House/apartment Stairs: No Has following equipment at home: Single point cane  OCCUPATION: disabled  PLOF: Independent, Independent with basic ADLs, Independent with household mobility with device, Independent with community mobility with device, Independent with homemaking with ambulation, Independent with gait, Independent with transfers, and Needs assistance with homemaking  PATIENT GOALS: She hopes to be able to stay functional and care for herself.   NEXT MD VISIT: prn  OBJECTIVE:  Note: Objective measures were completed at Evaluation unless otherwise noted.  DIAGNOSTIC FINDINGS:  na    SCREENING FOR RED FLAGS: Bowel or bladder incontinence: No Spinal tumors: No Cauda equina syndrome: No Compression fracture: No Abdominal aneurysm: No  COGNITION: Overall cognitive status: Within functional limits for tasks assessed     SENSATION: WFL  MUSCLE LENGTH: Hamstrings: Right approx 45 degrees deg; Left approx 45 deg Thomas test: Right pos; Left pos  POSTURE: rounded shoulders, increased lumbar lordosis, right pelvic obliquity, and acquired scoliosis  PALPATION: na  LUMBAR ROM:  Initial eval:   Held on testing due to severe pain: will assess as pain level decreases  AROM eval  Flexion   Extension   Right lateral flexion   Left lateral flexion   Right rotation   Left rotation    (Blank rows = not tested)  LOWER EXTREMITY ROM:     Numerous joint limitations and ROM deficits but she does have full overhead reach bilateral shoulders  LOWER EXTREMITY MMT:    Generally 4 to 4+ out of 5 in available range  LUMBAR SPECIAL TESTS:  Deferred due to recent surgery  FUNCTIONAL TESTS:  5 times sit to stand: 31.12 sec Timed up and go (TUG): 23.62 sec  GAIT: Distance walked: 30  feet Assistive device utilized: Single point cane Level of assistance: SBA Comments: antalgic, abnormal  TODAY'S TREATMENT:    DATE: 08/19/23 Pt seen for aquatic therapy today.  Treatment took place in water 3.5-4.75 ft in depth at the [facility] pool. Temp of water was 86.  Pt entered/exited the pool via stairs independently with bilat rail.  Pt feels better facing lap pool due to leg length difference.   * without support: walking forward/ backwards with cues for vertical trunk, * side stepping with arm addct/ abdct x 3 laps * marching forward/ backward * Ai chi postures:  (10 of each) contemplating, floating, uplifting, enclosing, folding, soothing, gathering, freeing * return to walking with arm swing and breast stroke arms    DATE: 08/16/23 Pt seen for aquatic therapy today.  Treatment took place in water 3.5-4.75 ft in depth at the [facility] pool. Temp of water was 91.  Pt entered/exited the pool via stairs independently with bilat rail.  Pt feels better facing lap pool due to leg length difference.   * without support: walking forward/ backwards with cues for vertical trunk,3 laps * side stepping with arm addct/ abdct x 2 laps -> side step into squat x 1 lap * staggered stance with shoulder addct/ abdct, horiz abdct/ addct * wall push up x 2- not tolerated due to Lt elbow pain * Ai chi postures:  (~5 of each) contemplating, floating, uplifting, enclosing, folding, soothing, gathering, freeing * straddling yellow noodle with breast stroke arms- cycling with legs suspended hip abdct/ addct, return to cycling    DATE: 08/12/23 Pt seen for aquatic therapy today.  Treatment took place in water 3.5-4.75 ft in depth at the [facility] pool. Temp of water was 91.  Pt entered/exited the pool via stairs independently with bilat rail.  Pt feels better facing lap pool due to leg length difference.   * without support: walking forward/ backwards with cues for  vertical trunk, reduced but even step length, - multiple laps * queen nods to tolerance (slight rotation with nod; painful to L); mild Rt levator stretch  * side stepping with arm addct/ abdct x 3 laps * farmer carry with short hollow noodle at side - walking forward/ backward * standard stance with TrA set and short hollow noodle pull down to thighs x 6 ( stopped due to pain in L elbow) * straddling yellow noodle with breast stroke arms- cycling with legs suspended hip abdct/ addct, return to cycling  * seated on bench in water with feet on blue step: STS with forward arm reach, cues for hip hinge and controlled descent x 6   08/04/23: Pt arrives for aquatic physical therapy. Treatment took place in 3.5-5.5 feet of water. Water temperature was 91 degrees F. Pt entered the pool via stairs step to step today with heavy use of the rails. PTA in front of pt for safety. Pt requires buoyancy of water for support and to offload joints with strengthening exercises.  Seated water bench with 75% submersion Pt performed seated LE AROM exercises 20x in all planes, concurrent discussion of current status and what our goals would be for the treatment session.  75% depth for water walking holding single buoy UE wts for balance 6x in each direction. VC for more upright posture as she tends to stoop forward. Attempted forward leg kicks holding onto same wts but pt was too unstable. She did better holding onto the wall (steady object) Bil 10x. Hip abduction Bil facing the wall 10x holding on for support. Underwater bicycle using yellow noodle in horseback 10 lengths with SBA and mod asst to get off noodle.  PATIENT EDUCATION:  Education details: aquatic therapy exercise progressions and modifications. Issued Ai Chi postures.  Person educated: Patient Education method: Explanation Education comprehension: verbalized understanding  HOME EXERCISE PROGRAM: TBD  ASSESSMENT:  CLINICAL IMPRESSION: Pt reported  reduction of pain to 2/10 during session.  Completed exercises in lap pool as trial for temperature with good tolerance.  Issued laminated Ai Chi exercise sheet for pt to begin using when she comes to pool with her daughter.  Pt is making progress towards established goals.    From initial evaluation: Patient is a 57 y.o. female who was seen today for physical therapy evaluation and treatment for post lumbar fusion and left THA.   She has underlying severe RA with multiple joint deformities and joint replacements.  She is fused at c spine as well.  She presents with decreased ROM and poor flexibility but generally good strength.  She would benefit from skilled PT in aquatics program initially to gain some control of her pain.  We will transition to land and pool after 4 weeks to see if she will be able to tolerate land based therapy with more manageable pain.    OBJECTIVE IMPAIRMENTS: decreased mobility, difficulty walking, decreased ROM, decreased strength, increased fascial restrictions, increased muscle spasms, impaired flexibility, impaired UE functional use, postural dysfunction, and pain.   ACTIVITY LIMITATIONS: carrying, lifting, bending, sitting, standing, squatting, sleeping, stairs, transfers, bed mobility, bathing, toileting, dressing, and hygiene/grooming  PARTICIPATION LIMITATIONS: meal prep, cleaning, laundry, driving, shopping, community activity, yard work, and church  PERSONAL FACTORS: Time since onset of injury/illness/exacerbation and 1-2 comorbidities: RA  are also affecting patient's functional outcome.   REHAB POTENTIAL: Fair Severity of RA and joint issues along with multiple sugeries may impede progress  CLINICAL DECISION MAKING: Evolving/moderate complexity  EVALUATION COMPLEXITY: Moderate   GOALS: Goals reviewed with patient? Yes  SHORT TERM GOALS: Target date: 08/25/2023   Pain report to be no greater than 6/10  Baseline: Goal status: In progress -  08/19/23  2.  Patient will be independent with initial HEP of aquatic exercises Baseline:  Goal status: In progress - 08/19/23  3.  Patient to be able to walk independently from parking lot of [facility] to pool independently without rest breaks using any assistive device  Baseline:  Goal status: In progress - 08/16/23    LONG TERM GOALS: Target date: 09/22/2023   Pain report to be no greater than 4/10  Baseline:  Goal status: INITIAL  2.  Patient to be independent with advanced HEP  Baseline:  Goal status: INITIAL  3.  Patient to be able to ascend and descend steps without pain or no greater than 2/10  Baseline:  Goal status: INITIAL  4.  Patient to be able to bend, stoop and squat with pain no greater than 2/10  Baseline:  Goal status: INITIAL  5.  Patient to be able to sleep through the night  Baseline:  Goal status: INITIAL  6.  Patient to report 85% improvement in overall symptoms Baseline:  Goal status: INITIAL  PLAN:  PT FREQUENCY: 1-2x/week  PT DURATION: 8 weeks  PLANNED INTERVENTIONS: 97146- PT Re-evaluation, 97110-Therapeutic exercises, 97530- Therapeutic activity, [id number]- Neuromuscular re-education, 97535- Self Care, [id number]- Manual therapy, [id number]- Gait training, [phone]- Canalith repositioning, [id number]- Aquatic Therapy, 97760- Splinting, 97014- Electrical stimulation (unattended), [id number]- Electrical stimulation (manual), [id number]- Vasopneumatic device, [id number]- Ultrasound, [id number]- Ionotophoresis 4mg /ml Dexamethasone, Patient/Family education, Balance training, Stair training, Taping, Dry Needling, Joint mobilization, Spinal mobilization, Scar mobilization, Vestibular training, Visual/preceptual remediation/compensation,  DME instructions, Cryotherapy, Moist heat, Therapeutic exercises, Therapeutic activity, Neuromuscular re-education, Gait training, and Self Care.  PLAN FOR NEXT SESSION: Aquatic therapy x 4 weeks   Mayer Camel, PTA 08/19/23 5:58  PM [facility] [address] [place], [place], [address] Phone: [phone]   Fax:  [phone]

## 2023-08-23 ENCOUNTER — Encounter: Payer: Self-pay | Admitting: Physical Therapy

## 2023-08-23 ENCOUNTER — Ambulatory Visit: Payer: Medicare Other | Attending: Family Medicine | Admitting: Physical Therapy

## 2023-08-23 DIAGNOSIS — M0579 Rheumatoid arthritis with rheumatoid factor of multiple sites without organ or systems involvement: Secondary | ICD-10-CM | POA: Insufficient documentation

## 2023-08-23 DIAGNOSIS — M5459 Other low back pain: Secondary | ICD-10-CM | POA: Insufficient documentation

## 2023-08-23 DIAGNOSIS — M79605 Pain in left leg: Secondary | ICD-10-CM | POA: Diagnosis present

## 2023-08-23 DIAGNOSIS — R252 Cramp and spasm: Secondary | ICD-10-CM | POA: Insufficient documentation

## 2023-08-23 NOTE — Therapy (Signed)
OUTPATIENT PHYSICAL THERAPY THORACOLUMBAR TREATMENT   Patient Name: Caitlin Wagner MRN: [id number] DOB:May 19, 1966, 57 y.o., female Today's Date: 08/23/2023  END OF SESSION:  PT End of Session - 08/23/23 1552     Visit Number 7    Date for PT Re-Evaluation 09/22/23    Authorization Type [employer]    Progress Note Due on Visit 10    PT Start Time 1403    PT Stop Time 1450    PT Time Calculation (min) 47 min    Activity Tolerance Patient tolerated treatment well    Behavior During Therapy [facility] for tasks assessed/performed              Past Medical History:  Diagnosis Date   Arthritis    RA   ASCUS of cervix with negative high risk HPV 10/2017   High risk HPV infection 03/2012   HSV (herpes simplex virus) anogenital infection 12/2016   LGSIL (low grade squamous intraepithelial dysplasia) 2013/2017   03/2012 positive high risk HPV,  09/2015 with negative high-risk HPV   Rheumatoid arthritis(714.0)    Spinal stenosis    Past Surgical History:  Procedure Laterality Date   feet reconstruction surgery     bilateral    I & D KNEE WITH POLY EXCHANGE Left 04/11/2018   Procedure: REVISION LEFT TOTAL KNEE- POLY EXCHANGE;  Surgeon: Samson Frederic, MD;  Location: [facility] OR;  Service: Orthopedics;  Laterality: Left;  Needs RNFA   TONSILLECTOMY  age 41   TOTAL KNEE ARTHROPLASTY Left 03/22/2017   Procedure: TOTAL KNEE ARTHROPLASTY w/ Navigation;  Surgeon: Samson Frederic, MD;  Location: [facility] OR;  Service: Orthopedics;  Laterality: Left;   TOTAL KNEE ARTHROPLASTY Right 05/10/2017   Procedure: TOTAL KNEE ARTHROPLASTY;  Surgeon: Samson Frederic, MD;  Location: [facility] OR;  Service: Orthopedics;  Laterality: Right;   Patient Active Problem List   Diagnosis Date Noted   Failed total knee, left, initial encounter (HCC) 04/11/2018   Degenerative arthritis of right knee 05/10/2017   Degenerative joint disease of right knee 05/10/2017   Rheumatoid arthritis (HCC)     PCP: Barbie Banner, MD   REFERRING  PROVIDER: Stamey, Verda Cumins, FNP  REFERRING DIAG:  Diagnosis  M06.9 (ICD-10-CM) - Rheumatoid arthritis involving multiple sites, unspecified whether rheumatoid factor present    Rationale for Evaluation and Treatment: Rehabilitation  THERAPY DIAG:  Other low back pain  Pain in left leg  Rheumatoid arthritis involving multiple sites with positive rheumatoid factor (HCC)  Cramp and spasm  ONSET DATE: 07/27/2023  SUBJECTIVE:  SUBJECTIVE STATEMENT: Aquatic therapy is going great. Patient is having a rough day. Her low back and Lt hip is bothering her. Currently she is having 10/10 pain.    From initial evaluation: Patient reports she has been diagnosed with RA for approx 20 years.  She has undergone multiple joint replacements and fusion surgeries.  Her two most recent surgeries include lumbar fusion and left hip replacement.  She was not able to do any PT after her lumbar fusion before she needed her hip replacement.  She feels she is losing the ability to be functional due to the severity of the pain and mobility issues.  She understands that she has developed tightness and weakness from not being able to do much in the way of activity for the past few months.  She hopes to be able to stay functional and care for herself.    PERTINENT HISTORY:  RA x 20 years  PAIN: 08/23/2023 Are you having pain? Yes: NPRS scale: 10/10 Pain location:generalized  Pain description: nerve pain and RA Aggravating factors: cold fronts coming through, any activity during RA flare Relieving factors: meds, rest  PRECAUTIONS: Other: RA for 20 years, multiple deformities and surgeries, ulnar drift  RED FLAGS: None   WEIGHT BEARING RESTRICTIONS: No  FALLS:  Has patient fallen in last 6 months? No  LIVING  ENVIRONMENT: Lives with: lives with their family Lives in: House/apartment Stairs: No Has following equipment at home: Single point cane  OCCUPATION: disabled  PLOF: Independent, Independent with basic ADLs, Independent with household mobility with device, Independent with community mobility with device, Independent with homemaking with ambulation, Independent with gait, Independent with transfers, and Needs assistance with homemaking  PATIENT GOALS: She hopes to be able to stay functional and care for herself.   NEXT MD VISIT: prn  OBJECTIVE:  Note: Objective measures were completed at Evaluation unless otherwise noted.  DIAGNOSTIC FINDINGS:  na    SCREENING FOR RED FLAGS: Bowel or bladder incontinence: No Spinal tumors: No Cauda equina syndrome: No Compression fracture: No Abdominal aneurysm: No  COGNITION: Overall cognitive status: Within functional limits for tasks assessed     SENSATION: WFL  MUSCLE LENGTH: Hamstrings: Right approx 45 degrees deg; Left approx 45 deg Thomas test: Right pos; Left pos  POSTURE: rounded shoulders, increased lumbar lordosis, right pelvic obliquity, and acquired scoliosis  PALPATION: na  LUMBAR ROM:  Initial eval:   Held on testing due to severe pain: will assess as pain level decreases  AROM eval  Flexion   Extension   Right lateral flexion   Left lateral flexion   Right rotation   Left rotation    (Blank rows = not tested)  LOWER EXTREMITY ROM:     Numerous joint limitations and ROM deficits but she does have full overhead reach bilateral shoulders  LOWER EXTREMITY MMT:    Generally 4 to 4+ out of 5 in available range  LUMBAR SPECIAL TESTS:  Deferred due to recent surgery  FUNCTIONAL TESTS:  5 times sit to stand: 31.12 sec Timed up and go (TUG): 23.62 sec  GAIT: Distance walked: 30 feet Assistive device utilized: Single point cane Level of assistance: SBA Comments: antalgic, abnormal  TODAY'S TREATMENT:     DATE:  08/23/2023 NuStep Level 1 5 minutes - PT present to discuss progress Manual: P/ROM Lt hamstring in seated + STM to distal hamstring  Seated hamstring stretch 2 x 30 sec Standing hip flexor stretch 2 x 30sec Supine LTR 2 x 20 sec  holds Supine Figure Four stretch Rt 2 x 30sec Supine single knee to chest with PT providing over pressure 2 x 20 sec each Education on muscle soreness vs nerve pain vs RA pain   08/19/23 Pt seen for aquatic therapy today.  Treatment took place in water 3.5-4.75 ft in depth at the [facility] pool. Temp of water was 86.  Pt entered/exited the pool via stairs independently with bilat rail.  Pt feels better facing lap pool due to leg length difference.   * without support: walking forward/ backwards with cues for vertical trunk, * side stepping with arm addct/ abdct x 3 laps * marching forward/ backward * Ai chi postures:  (10 of each) contemplating, floating, uplifting, enclosing, folding, soothing, gathering, freeing * return to walking with arm swing and breast stroke arms    DATE: 08/16/23 Pt seen for aquatic therapy today.  Treatment took place in water 3.5-4.75 ft in depth at the [facility] pool. Temp of water was 91.  Pt entered/exited the pool via stairs independently with bilat rail.  Pt feels better facing lap pool due to leg length difference.   * without support: walking forward/ backwards with cues for vertical trunk,3 laps * side stepping with arm addct/ abdct x 2 laps -> side step into squat x 1 lap * staggered stance with shoulder addct/ abdct, horiz abdct/ addct * wall push up x 2- not tolerated due to Lt elbow pain * Ai chi postures:  (~5 of each) contemplating, floating, uplifting, enclosing, folding, soothing, gathering, freeing * straddling yellow noodle with breast stroke arms- cycling with legs suspended hip abdct/ addct, return to cycling    DATE: 08/12/23 Pt seen for aquatic therapy today.  Treatment took  place in water 3.5-4.75 ft in depth at the [facility] pool. Temp of water was 91.  Pt entered/exited the pool via stairs independently with bilat rail.  Pt feels better facing lap pool due to leg length difference.   * without support: walking forward/ backwards with cues for vertical trunk, reduced but even step length, - multiple laps * queen nods to tolerance (slight rotation with nod; painful to L); mild Rt levator stretch  * side stepping with arm addct/ abdct x 3 laps * farmer carry with short hollow noodle at side - walking forward/ backward * standard stance with TrA set and short hollow noodle pull down to thighs x 6 ( stopped due to pain in L elbow) * straddling yellow noodle with breast stroke arms- cycling with legs suspended hip abdct/ addct, return to cycling  * seated on bench in water with feet on blue step: STS with forward arm reach, cues for hip hinge and controlled descent x 6     PATIENT EDUCATION:  Education details: aquatic therapy exercise progressions and modifications. Issued Ai Chi postures.  Person educated: Patient Education method: Explanation Education comprehension: verbalized understanding  HOME EXERCISE PROGRAM: Access Code: [email or web address] URL: [email or web address] Date: 08/23/2023 Prepared by: Claude Manges  Exercises - Supine Lower Trunk Rotation  - 1-2 x daily - 7 x weekly - 2 sets - 2 reps - 20-30 hold - Supine Single Knee to Chest Stretch  - 1-2 x daily - 7 x weekly - 2 sets - 20-30 hold - Seated Hamstring Stretch  - 1-2 x daily - 7 x weekly - 2 sets - 20-30 hold - Standing Hip Flexor Stretch  - 1-2 x daily - 7 x weekly - 2 sets - 15-20 hold  ASSESSMENT:  CLINICAL IMPRESSION:  Jnaya has been responding well aquatic therapy. She was having a rough day today and having 10/10 pain in her low back and Lt hip. Since patient has had multiple joint replacement dislocations educated patient on exercises and motions to stay away from.  Updating patient's HEP to include hip and lumbar mobility exercises. Palpable muscle spasms were felt in Lt distal hamstring which responded well to manual therapy and P/ROM. Provided patient education on different types of pain she might be experiencing. Treatment session was kept lower intensity due to patient's high pain levels. Patient will benefit from skilled PT to address the below impairments and improve overall function.    From initial evaluation: Patient is a 57 y.o. female who was seen today for physical therapy evaluation and treatment for post lumbar fusion and left THA.   She has underlying severe RA with multiple joint deformities and joint replacements.  She is fused at c spine as well.  She presents with decreased ROM and poor flexibility but generally good strength.  She would benefit from skilled PT in aquatics program initially to gain some control of her pain.  We will transition to land and pool after 4 weeks to see if she will be able to tolerate land based therapy with more manageable pain.    OBJECTIVE IMPAIRMENTS: decreased mobility, difficulty walking, decreased ROM, decreased strength, increased fascial restrictions, increased muscle spasms, impaired flexibility, impaired UE functional use, postural dysfunction, and pain.   ACTIVITY LIMITATIONS: carrying, lifting, bending, sitting, standing, squatting, sleeping, stairs, transfers, bed mobility, bathing, toileting, dressing, and hygiene/grooming  PARTICIPATION LIMITATIONS: meal prep, cleaning, laundry, driving, shopping, community activity, yard work, and church  PERSONAL FACTORS: Time since onset of injury/illness/exacerbation and 1-2 comorbidities: RA  are also affecting patient's functional outcome.   REHAB POTENTIAL: Fair Severity of RA and joint issues along with multiple sugeries may impede progress  CLINICAL DECISION MAKING: Evolving/moderate complexity  EVALUATION COMPLEXITY: Moderate   GOALS: Goals reviewed  with patient? Yes  SHORT TERM GOALS: Target date: 08/25/2023   Pain report to be no greater than 6/10  Baseline: Goal status: In progress - 08/19/23  2.  Patient will be independent with initial HEP of aquatic exercises Baseline:  Goal status: In progress - 08/19/23  3.  Patient to be able to walk independently from parking lot of [facility] to pool independently without rest breaks using any assistive device  Baseline:  Goal status: In progress - 08/16/23    LONG TERM GOALS: Target date: 09/22/2023   Pain report to be no greater than 4/10  Baseline:  Goal status: INITIAL  2.  Patient to be independent with advanced HEP  Baseline:  Goal status: INITIAL  3.  Patient to be able to ascend and descend steps without pain or no greater than 2/10  Baseline:  Goal status: INITIAL  4.  Patient to be able to bend, stoop and squat with pain no greater than 2/10  Baseline:  Goal status: INITIAL  5.  Patient to be able to sleep through the night  Baseline:  Goal status: INITIAL  6.  Patient to report 85% improvement in overall symptoms Baseline:  Goal status: INITIAL  PLAN:  PT FREQUENCY: 1-2x/week  PT DURATION: 8 weeks  PLANNED INTERVENTIONS: 97146- PT Re-evaluation, 97110-Therapeutic exercises, 97530- Therapeutic activity, [id number]- Neuromuscular re-education, 97535- Self Care, [id number]- Manual therapy, [id number]- Gait training, [phone]- Canalith repositioning, [id number]- Aquatic Therapy, 97760- Splinting, 97014- Electrical stimulation (unattended), [id number]- Electrical stimulation (manual), [id number]- Vasopneumatic  device, [id number]- Ultrasound, 16109- Ionotophoresis 4mg /ml Dexamethasone, Patient/Family education, Balance training, Stair training, Taping, Dry Needling, Joint mobilization, Spinal mobilization, Scar mobilization, Vestibular training, Visual/preceptual remediation/compensation, DME instructions, Cryotherapy, Moist heat, Therapeutic exercises, Therapeutic activity, Neuromuscular  re-education, Gait training, and Self Care.  PLAN FOR NEXT SESSION: Aquatic therapy x 4 weeks; assess HEP & pain levels   Claude Manges, PT 08/23/23 3:53 PM

## 2023-08-26 ENCOUNTER — Ambulatory Visit (HOSPITAL_BASED_OUTPATIENT_CLINIC_OR_DEPARTMENT_OTHER): Payer: Medicare Other | Attending: Family Medicine | Admitting: Physical Therapy

## 2023-08-26 NOTE — Progress Notes (Signed)
Office Visit Note  Patient: Caitlin Wagner             Date of Birth: April 23, 1966           MRN: [id number]             PCP: Barbie Banner, MD Referring: Barbie Banner, MD Visit Date: 08/30/2023 Occupation: @GUAROCC @  Subjective:  Pain in multiple joints  History of Present Illness: Caitlin Wagner is a 57 y.o. female with history of rheumatoid arthritis.  She came to establish care today.  She states 1999 she developed increased joint pain and discomfort while she was 2 months postpartum.  She states she was having difficulty moving around and taking care of her kids.  4 months later she was diagnosed with rheumatoid arthritis based on positive rheumatoid factor.  She was referred to a rheumatologist Dr. Cardell Peach.  Who diagnosed her with rheumatoid arthritis and started her on methotrexate.  She states the methotrexate caused nausea and she was switched to subcu methotrexate.  Enbrel was also added soon due to aggressive nature of rheumatoid arthritis.  She states she took Enbrel for less than 1 year but it was discontinued due to insurance issues.  Then she was started on IV Remicade which she took for about 5 years and did quite well until she had anaphylactic reaction to Remicade.  She was switched to Humira subcutaneous which she took for about 5 years and then stopped working.  She also took Orencia for few months but did not have an adequate response to the medication.  She started [place] which she took for about 5 years and then was switched Rinvoq due to an inadequate response.  She states she did really well on Rinvoq for about 3 to 4 years but then she started having back pain and she was concerned if back pain was due to rheumatoid arthritis.  She was switched to Cimzia in September 2023.  She states she had taken Cimzia only intermittently since then as she had several surgeries.  She started taking Cimzia on a regular basis for the last 5-6 months but she is not having an adequate  response to Cimzia.  She continues to have pain and swelling in multiple joints.  Looking back she thinks her pain was coming from the spinal discomfort and she would like to try Rinvoq again.  Her last Cimzia injection was last week.  She states prior to that she was having a lot of pain and swelling in her multiple joints.  She complains of discomfort in her cervical spine, thoracic and lumbar spine, shoulders, elbows, wrist, hands, knees, ankles and her feet.  She states she has pain and swelling in almost all of her joints.  She has not taken prednisone frequently in the past.  But she states in the last year she had to take 3 courses of prednisone taper as she was not taking Cimzia on a regular basis.  She would like to have a prednisone taper now due to ongoing pain and swelling.  She has had cervical spine and lumbar spine surgery as mentioned below.  She also had bilateral total hip replacement and bilateral total knee replacements as mentioned below.  She has had revision on her left knee x 2.  She continues to have pain and discomfort in the bilateral knee joints but denies discomfort in her hip joints.  She takes tizanidine, meloxicam and gabapentin due to spinal discomfort.  There is family  history of rheumatoid arthritis and great maternal aunt.  She is gravida 2 and para 2.  Has no history of preeclampsia or blood clots.  She is currently unemployed.  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  She goes to the gym and does water aerobics.  She recently finished water therapy.    Activities of Daily Living:  Patient reports morning stiffness for all day. Patient Reports nocturnal pain.  Difficulty dressing/grooming: Reports Difficulty climbing stairs: Reports Difficulty getting out of chair: Reports Difficulty using hands for taps, buttons, cutlery, and/or writing: Reports  Review of Systems  Constitutional:  Positive for fatigue.  HENT:  Positive for mouth dryness.  Negative for mouth sores.   Eyes:  Positive for dryness.  Respiratory:  Negative for difficulty breathing.   Cardiovascular:  Negative for chest pain and palpitations.  Gastrointestinal:  Positive for diarrhea. Negative for blood in stool and constipation.  Endocrine: Negative for increased urination.  Genitourinary:  Positive for involuntary urination. Negative for painful urination.  Musculoskeletal:  Positive for joint pain, gait problem, joint pain, joint swelling, myalgias, muscle weakness, morning stiffness, muscle tenderness and myalgias.  Skin:  Negative for color change, rash, hair loss and sensitivity to sunlight.  Allergic/Immunologic: Negative for susceptible to infections.  Neurological:  Negative for dizziness and headaches.  Hematological:  Negative for swollen glands.  Psychiatric/Behavioral:  Positive for sleep disturbance. Negative for depressed mood. The patient is not nervous/anxious.     PMFS History:  Patient Active Problem List   Diagnosis Date Noted   Failed total knee, left, initial encounter (HCC) 04/11/2018   Degenerative arthritis of right knee 05/10/2017   Degenerative joint disease of right knee 05/10/2017   Rheumatoid arthritis (HCC)     Past Medical History:  Diagnosis Date   Arthritis    RA   ASCUS of cervix with negative high risk HPV 10/2017   High risk HPV infection 03/2012   HSV (herpes simplex virus) anogenital infection 12/2016   LGSIL (low grade squamous intraepithelial dysplasia) 2013/2017   03/2012 positive high risk HPV,  09/2015 with negative high-risk HPV   Rheumatoid arthritis(714.0)    Spinal stenosis     Family History  Problem Relation Age of Onset   Heart disease Father        heart attack at age 71   Diverticulitis Father    Cancer Father        Bladder    Healthy Brother    Rheum arthritis Maternal Aunt    Heart failure Maternal Grandmother    Cancer Maternal Grandfather        ? type   Diabetes Paternal Grandmother     Heart disease Paternal Grandfather    Healthy Son    Healthy Daughter    Autism Daughter    Anxiety disorder Daughter    ADD / ADHD Daughter    Past Surgical History:  Procedure Laterality Date   CERVICAL SPINE SURGERY     08/1992 x2 surgeries 1 week apart.   feet reconstruction surgery     bilateral    I & D KNEE WITH POLY EXCHANGE Left 04/11/2018   Procedure: REVISION LEFT TOTAL KNEE- POLY EXCHANGE;  Surgeon: Samson Frederic, MD;  Location: [facility] OR;  Service: Orthopedics;  Laterality: Left;  Needs RNFA   LUMBAR FUSION  02/2023   TONSILLECTOMY  age 87   TOTAL HIP ARTHROPLASTY Right 12/2020   TOTAL HIP ARTHROPLASTY Left 12/2022   TOTAL KNEE ARTHROPLASTY  Left 03/22/2017   Procedure: TOTAL KNEE ARTHROPLASTY w/ Navigation;  Surgeon: Samson Frederic, MD;  Location: [facility] OR;  Service: Orthopedics;  Laterality: Left;   TOTAL KNEE ARTHROPLASTY Right 05/10/2017   Procedure: TOTAL KNEE ARTHROPLASTY;  Surgeon: Samson Frederic, MD;  Location: [facility] OR;  Service: Orthopedics;  Laterality: Right;   TOTAL KNEE REVISION Left 07/2020   level 3   Social History   Social History Narrative   ** Merged History Encounter **       Immunization History  Administered Date(s) Administered   Influenza,inj,Quad PF,6+ Mos 08/01/2015   PFIZER(Purple Top)SARS-COV-2 Vaccination 01/10/2020, 01/27/2020, 06/06/2020     Objective: Vital Signs: BP 111/84 (BP Location: Right Arm, Patient Position: Sitting, Cuff Size: Normal)   Pulse (!) 111   Resp 16   Ht 5' 0.5" (1.537 m)   Wt 173 lb 6.4 oz (78.7 kg)   LMP 04/06/2018   BMI 33.31 kg/m    Physical Exam Vitals and nursing note reviewed.  Constitutional:      Appearance: She is well-developed.  HENT:     Head: Normocephalic and atraumatic.  Eyes:     Conjunctiva/sclera: Conjunctivae normal.  Cardiovascular:     Rate and Rhythm: Normal rate and regular rhythm.     Heart sounds: Normal heart sounds.  Pulmonary:     Effort: Pulmonary effort is normal.      Breath sounds: Normal breath sounds.  Abdominal:     General: Bowel sounds are normal.     Palpations: Abdomen is soft.  Musculoskeletal:     Cervical back: Normal range of motion.  Lymphadenopathy:     Cervical: No cervical adenopathy.  Skin:    General: Skin is warm and dry.     Capillary Refill: Capillary refill takes less than 2 seconds.  Neurological:     Mental Status: She is alert and oriented to person, place, and time.  Psychiatric:        Behavior: Behavior normal.      Musculoskeletal Exam: She had limited lateral rotation of the cervical spine.  She had some tenderness in the upper thoracic region.  She had tenderness in the lower lumbar region.  Shoulder joints were in full range of motion.  She had bilateral elbow joint contractures with warmth on palpation of her left elbow joint.  She had no extension of her wrist joints.  She had limited flexion of the wrist joints.  She had flexion contracture of most of the MCP joints with ulnar deviation.  Synovitis was noted over MCP joints.  There was no tenderness over PIPs or DIPs.  Hip joints could not be assessed in the seated position.  Her bilateral hip joints were replaced.  Knee joints with full range of motion which were also replaced.  There is swelling over bilateral ankle joints.  She had subluxation of most of her MTPs with overcrowding of the toes.  Tenderness across the MTPs was noted.  CDAI Exam: CDAI Score: 40  Patient Global: 80 / 100; Provider Global: 80 / 100 Swollen: 18 ; Tender: 18  Joint Exam 08/30/2023      Right  Left  Elbow  Swollen Tender  Swollen Tender  MCP 1  Swollen Tender  Swollen Tender  MCP 2  Swollen Tender  Swollen Tender  MCP 3  Swollen Tender  Swollen Tender  PIP 2 (finger)     Swollen Tender  PIP 3 (finger)  Swollen Tender  Swollen Tender  PIP 4 (finger)  Swollen  Tender     Ankle  Swollen Tender  Swollen Tender  MTP 2  Swollen Tender  Swollen Tender  MTP 3     Swollen Tender  MTP 4   Swollen Tender        Investigation: No additional findings.  Imaging: No results found.  Recent Labs: Lab Results  Component Value Date   WBC 9.0 04/12/2018   HGB 11.2 (L) 04/12/2018   PLT 224 04/12/2018   NA 141 04/12/2018   K 3.9 04/12/2018   CL 112 (H) 04/12/2018   CO2 24 04/12/2018   GLUCOSE 99 04/12/2018   BUN 9 04/12/2018   CREATININE 0.53 04/12/2018   BILITOT 0.5 04/29/2017   ALKPHOS 61 04/29/2017   AST 20 04/29/2017   ALT 16 04/29/2017   PROT 7.0 04/29/2017   ALBUMIN 3.5 04/29/2017   CALCIUM 8.3 (L) 04/12/2018   GFRAA >60 04/12/2018    Speciality Comments: Methotrexate: Subcutaneous-inadequate response Leflunomide-diarrhea Enbrel, Humira, Orencia, Cimzia inadequate response, Remicade -anaphylaxis, Xeljanz-inadequate response  Procedures:  No procedures performed Allergies: Fentanyl, Remicade [infliximab], Arava [leflunomide], Cymbalta [duloxetine hcl], Dilaudid [hydromorphone hcl], and Codeine   Assessment / Plan:     Visit Diagnoses: Seropositive rheumatoid arthritis (HCC) - Dx 1999-[place]. +RF,+CCP, ANA- -patient has severe end-stage rheumatoid arthritis with pain and discomfort in multiple joints.  She has been having a flare of rheumatoid arthritis with swelling in multiple joints.  She has been under care of Dr. Cardell Peach for multiple years and then she moved to [place].  She was in [place] for about 5 years and moved back to [place] in August 2024.  She has taken several medications in the past including methotrexate which caused an inadequate response.  She was on leflunomide which she recently discontinued a couple of months ago due to severe diarrhea.  She has failed Enbrel, Humira, Orencia, Xeljanz in the past.  She had good response to IV Remicade which she took for 5 years and discontinued due to anaphylaxis.  She was on Rinvoq for about 3 years which she stopped in September 2023 when she was switched to Cimzia due to lower back pain.  Patient states the back  pain was not related to rheumatoid arthritis and is better after the surgery.  She has been on Cimzia consistently for the last 5 to 6 months.  She states she continues to have flare and swelling in multiple joints.  She states so far she had best response to Rinvoq.  She would like to go back on Rinvoq.  Indications side effects contraindications of Rinvoq were discussed at length.  Patient took Cimzia injection last week.  Once Rinvoq is approved we can start her on Rinvoq.  A handout was given and consent was taken.  Will check labs and 1 month after starting Rinvoq and then every 3 months.  I will also obtain baseline labs today.  A baseline chest x-ray will be obtained.  Plan: DG Chest 2 View, Sedimentation rate.  Patient requested prednisone taper.  Will start on prednisone 20 mg p.o. daily and taper by 5 mg every week.  She was advised to stop meloxicam while she is on prednisone.  Side effects of prednisone including increased risk of hypertension, diabetes, weight gain, cataracts, osteoporosis were discussed.  Patient states she has taken 3 courses of prednisone in the last year.  She sparingly took prednisone prior to that.  She states she had a DEXA scan which was normal.  I do not have the results  available.  High risk medication use -(Arava 20 mg daily-discontinued 2 months ago due to diarrhea) cimzia 200 mg sq injections every 2 weeks-last injection was a week ago.  Meloxicam 7.5 mg BID.Previous tx: MTX, Enbrel, remicade, Humira, orencia, xeljanz, rinvoq, - Plan: DG Chest 2 View, CBC with Differential/Platelet, COMPLETE METABOLIC PANEL WITH GFR, Hepatitis B core antibody, IgM, Hepatitis B surface antigen, Hepatitis C antibody, QuantiFERON-TB Gold Plus, Serum protein electrophoresis with reflex, IgG, IgA, IgM, Glucose 6 phosphate dehydrogenase today.  Information on immunization was placed in the AVS.  She was advised to hold Rinvoq if she develops an infection resume after the infection resolves.   Blackbox warning regarding Mace associated with Rinvoq was also discussed.  A handout was placed in the AVS.  The warning includes increased risk of cardiovascular death number MI, stroke, DVT, pulmonary embolism and arterial thrombosis.  Patient voiced understanding.  Pain in both hands -she complains of pain and discomfort in bilateral hands.  Synovitis was noted in multiple joints as described above.  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  ANA, Rheumatoid factor, Cyclic citrul peptide antibody, IgG  Arthritis of carpometacarpal [facility]) joint of both thumbs  H/O bilateral hip replacements - RTHR 2022, LTHR 12/2022 by Dr. Colin Mulders in [place].  Hip joint range of motion was difficult to assess in the seated position.  Status post total knee replacement, bilateral -she had good range of motion of bilateral knee joints with some discomfort.  No swelling or effusion was noted.  ONGE 2018, LTKR 2018, 2019,2021 Dr. Linna Caprice, surgery 2021 was by Dr. Colin Mulders in [place].   Chronic pain of both ankles-she has been having pain and swelling in bilateral ankles for many years.  Plan: XR Ankle 2 Views Right, XR Ankle 2 Views Left.  X-rays are consistent with rheumatoid arthritis.  Pain in both feet -she has on pain and discomfort in the bilateral feet.  Bilateral feet reconstruction surgery 2009 at [facility]. - Plan: XR Foot 2 Views Right, XR Foot 2 Views Left.  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  Cervical stenosis of spinal canal -chronic pain and limited range of motion as described above.  S/P fusion and  laminectomy 11/23 in [place].  Lumbar stenosis with neurogenic claudication -she continues to have pain and discomfort in her lower back.  S/p fusion  02/24/23 in [place].  Myalgia -she complains of pain in almost all of her muscles.  She states the muscle pain increased after the  physical therapy.  Plan: CK  History of vertebral fracture - 1st thoracic vertebrae after the cervical fusion. 12/2022: s/p ACDF, now c/b hardware loosening and T1 vertebral body fracture, anticipating revision.  Vitamin D deficiency -patient with history of vitamin D deficiency.  Plan: VITAMIN D 25 Hydroxy (Vit-D Deficiency, Fractures)  History of herpes genitalis  Former smoker - 1PPDX 10 years, quit 2015  Family history of rheumatoid arthritis-maternal great aunt  Orders: Orders Placed This Encounter  Procedures   XR Hand 2 View Right   XR Hand 2 View Left   XR Foot 2 Views Right   XR Foot 2 Views Left   XR Ankle 2 Views Right   XR Ankle 2 Views Left   DG Chest 2 View   DG Thoracic Spine 2 View   CBC with Differential/Platelet   COMPLETE METABOLIC PANEL WITH GFR   CK   Sedimentation rate   ANA  Rheumatoid factor   Cyclic citrul peptide antibody, IgG   Hepatitis B core antibody, IgM   Hepatitis B surface antigen   Hepatitis C antibody   QuantiFERON-TB Gold Plus   Serum protein electrophoresis with reflex   IgG, IgA, IgM   Glucose 6 phosphate dehydrogenase   VITAMIN D 25 Hydroxy (Vit-D Deficiency, Fractures)   Lipid panel   No orders of the defined types were placed in this encounter.   Face-to-face time spent with patient was over 60 minutes. Greater than 50% of time was spent in counseling and coordination of care.  Follow-Up Instructions: Return for Rheumatoid arthritis.   Pollyann Savoy, MD  Note - This record has been created using Animal nutritionist.  Chart creation errors have been sought, but may not always  have been located. Such creation errors do not reflect on  the standard of medical care.

## 2023-08-27 ENCOUNTER — Encounter: Payer: Self-pay | Admitting: Family Medicine

## 2023-08-27 DIAGNOSIS — Z1231 Encounter for screening mammogram for malignant neoplasm of breast: Secondary | ICD-10-CM

## 2023-08-30 ENCOUNTER — Ambulatory Visit: Payer: Commercial Managed Care - PPO | Attending: Rheumatology | Admitting: Rheumatology

## 2023-08-30 ENCOUNTER — Telehealth: Payer: Self-pay | Admitting: Pharmacist

## 2023-08-30 ENCOUNTER — Ambulatory Visit: Payer: Commercial Managed Care - PPO

## 2023-08-30 ENCOUNTER — Ambulatory Visit (INDEPENDENT_AMBULATORY_CARE_PROVIDER_SITE_OTHER): Payer: Commercial Managed Care - PPO

## 2023-08-30 ENCOUNTER — Encounter: Payer: Self-pay | Admitting: Rheumatology

## 2023-08-30 VITALS — BP 111/84 | HR 111 | Resp 16 | Ht 60.5 in | Wt 173.4 lb

## 2023-08-30 DIAGNOSIS — G8929 Other chronic pain: Secondary | ICD-10-CM

## 2023-08-30 DIAGNOSIS — Z96643 Presence of artificial hip joint, bilateral: Secondary | ICD-10-CM

## 2023-08-30 DIAGNOSIS — Z79899 Other long term (current) drug therapy: Secondary | ICD-10-CM

## 2023-08-30 DIAGNOSIS — M79672 Pain in left foot: Secondary | ICD-10-CM

## 2023-08-30 DIAGNOSIS — E559 Vitamin D deficiency, unspecified: Secondary | ICD-10-CM

## 2023-08-30 DIAGNOSIS — Z8781 Personal history of (healed) traumatic fracture: Secondary | ICD-10-CM

## 2023-08-30 DIAGNOSIS — M180 Bilateral primary osteoarthritis of first carpometacarpal joints: Secondary | ICD-10-CM | POA: Diagnosis not present

## 2023-08-30 DIAGNOSIS — M791 Myalgia, unspecified site: Secondary | ICD-10-CM | POA: Diagnosis not present

## 2023-08-30 DIAGNOSIS — M25572 Pain in left ankle and joints of left foot: Secondary | ICD-10-CM

## 2023-08-30 DIAGNOSIS — M4802 Spinal stenosis, cervical region: Secondary | ICD-10-CM | POA: Diagnosis not present

## 2023-08-30 DIAGNOSIS — Z96653 Presence of artificial knee joint, bilateral: Secondary | ICD-10-CM | POA: Diagnosis not present

## 2023-08-30 DIAGNOSIS — M059 Rheumatoid arthritis with rheumatoid factor, unspecified: Secondary | ICD-10-CM

## 2023-08-30 DIAGNOSIS — M79641 Pain in right hand: Secondary | ICD-10-CM | POA: Diagnosis not present

## 2023-08-30 DIAGNOSIS — M79642 Pain in left hand: Secondary | ICD-10-CM

## 2023-08-30 DIAGNOSIS — Z87891 Personal history of nicotine dependence: Secondary | ICD-10-CM

## 2023-08-30 DIAGNOSIS — M48062 Spinal stenosis, lumbar region with neurogenic claudication: Secondary | ICD-10-CM

## 2023-08-30 DIAGNOSIS — Z8619 Personal history of other infectious and parasitic diseases: Secondary | ICD-10-CM

## 2023-08-30 DIAGNOSIS — M79671 Pain in right foot: Secondary | ICD-10-CM

## 2023-08-30 DIAGNOSIS — Z96652 Presence of left artificial knee joint: Secondary | ICD-10-CM

## 2023-08-30 DIAGNOSIS — M25571 Pain in right ankle and joints of right foot: Secondary | ICD-10-CM

## 2023-08-30 DIAGNOSIS — Z8719 Personal history of other diseases of the digestive system: Secondary | ICD-10-CM

## 2023-08-30 DIAGNOSIS — M546 Pain in thoracic spine: Secondary | ICD-10-CM | POA: Diagnosis not present

## 2023-08-30 DIAGNOSIS — Z96651 Presence of right artificial knee joint: Secondary | ICD-10-CM

## 2023-08-30 DIAGNOSIS — Z8261 Family history of arthritis: Secondary | ICD-10-CM

## 2023-08-30 MED ORDER — PREDNISONE 5 MG PO TABS: ORAL_TABLET | ORAL | 0 refills | Status: DC

## 2023-08-30 NOTE — Progress Notes (Signed)
Pharmacy Note  Subjective: Patient presents today to [facility] for follow up office visit. Patient seen by the pharmacist for counseling on Rinvoq for rheumatoid arthritis.  Previous therapy include: Cimzia every 2 weeks (current)< Rinvoq, MTX (inadequate response), leflunomide (diarrhea), Enbrel (waning response),Humira (inadequate response), Orencia (waning response), Remicade (anaphylaxis), Xeljanz (inadequate response)  History of diverticulitis:  No  History of MI, stroke, or CV events:  No  Objective:  CMP     Component Value Date/Time   NA 141 04/12/2018 0642   K 3.9 04/12/2018 0642   CL 112 (H) 04/12/2018 0642   CO2 24 04/12/2018 0642   GLUCOSE 99 04/12/2018 0642   BUN 9 04/12/2018 0642   CREATININE 0.53 04/12/2018 0642   CALCIUM 8.3 (L) 04/12/2018 0642   PROT 7.0 04/29/2017 1038   ALBUMIN 3.5 04/29/2017 1038   AST 20 04/29/2017 1038   ALT 16 04/29/2017 1038   ALKPHOS 61 04/29/2017 1038    CBC    Component Value Date/Time   WBC 9.0 04/12/2018 0642   RBC 3.89 04/12/2018 0642   HGB 11.2 (L) 04/12/2018 0642   HCT 36.5 04/12/2018 0642   PLT 224 04/12/2018 0642   MCV 93.8 04/12/2018 0642   MCH 28.8 04/12/2018 0642   MCHC 30.7 04/12/2018 0642   RDW 14.6 04/12/2018 0642    Baseline Immunosuppressant Therapy Labs TB GOLD   Hepatitis Panel    Latest Ref Rng & Units 04/12/2012   11:22 AM  Hepatitis  Hep B Surface Ag NEGATIVE NEGATIVE   Hep C Ab NEGATIVE NEGATIVE    HIV Lab Results  Component Value Date   HIV NON REACTIVE 04/12/2012   Immunoglobulins   SPEP    Latest Ref Rng & Units 04/29/2017   10:38 AM  Serum Protein Electrophoresis  Total Protein 6.5 - 8.1 g/dL 7.0    [id number] No results found for: "G6PDH" TPMT No results found for: "TPMT"   Lipid Panel No results found for: "CHOL", "HDL", "LDLCALC", "LDLDIRECT", "TRIG", "CHOLHDL"   Assessment/Plan:  Counseled patient that Rinvoq is a JAK inhibitor indicated for Rheumatoid  Arthritis.  Counseled patient on purpose, proper use, and adverse effects of Rinvoq.    Reviewed the most common adverse effects including infection, diarrhea, headaches.  Also reviewed rare adverse effects such as bowel injury and the need to contact us if they develop stomach pain during treatment. Counseled on the increase risk of venous thrombosis. Counseled about FDA black box warning of MACE (major adverse CV events including cardiovascular death, myocardial infarction, and stroke).  Reviewed with patient that there is the possibility of an increased risk of malignancy specifically lung cancer and lymphomas but it is not well understood if this increased risk is due to the medication or the disease state. Instructed patient that medication should be held for infection and prior to surgery.  Advised patient to avoid live vaccines. Recommend annual influenza, PCV 15 or PCV20 or Pneumovax 23, and Shingrix as indicated.    Reviewed importance of routine lab monitoring including lipid panel.  Will recheck lipid panel 3 months after starting and annually thereafter. CBC and CMP will be monitored routinely every 3 months. Standing orders placed. Provided patient with medication education material and answered all questions.  Patient consented to Rinvoq.  Will upload into patient's chart.  Will apply through patient's insurance and update when we receive a response.    Patient dose will be 15 mg daily.  Prescription will be sent to pharmacy pending  lab results and insurance approval.   Last Cimzia dose was on 08/24/2023 which she takes every 2 weeks. Once approved, she will be able to start Rinvoq on or after 09/07/2023.  Rx for prednisone taper 5mg  tabs ( Take 4 tabs once daily x 7 days, then 3 tabs once daily x 7 days, then 2 tabs once daily x 7 days, then 1 tab daily x 7 days ) sent to pharmacy today. She has been advised to avoid NSAIDs while on prednisone  Chesley Mires, PharmD, MPH, BCPS, CPP Clinical  Pharmacist (Rheumatology and Pulmonology)

## 2023-08-30 NOTE — Telephone Encounter (Signed)
Pending OV note from today, please start Rinvoq BIV  Dose: 15mg  p.o once daily  Treatment history: Cimzia every 2 weeks (current), Rinvoq, MTX (inadequate response), leflunomide (diarrhea), Enbrel (waning response),Humira (inadequate response), Orencia (waning response), Remicade (anaphylaxis), Xeljanz (inadequate response)  Last Cimzia dose was on 08/24/2023 which she takes every 2 weeks. Once approved, she will be able to start Rinvoq on or after 09/07/2023.  Chesley Mires, PharmD, MPH, BCPS, CPP Clinical Pharmacist (Rheumatology and Pulmonology)

## 2023-08-30 NOTE — Patient Instructions (Addendum)
Prednisone taper sent to pharmacy. AVOID NSAIDS including meloxicam while taking prednisone  Upadacitinib Extended-Release Tablets What is this medication? UPADACITINIB (ue PAD a SYE ti nib) treats autoimmune conditions, such as arthritis, eczema, and ulcerative colitis. It is often used when other medications have not worked well enough or cannot be tolerated. It works by slowing down an overactive immune system. This decreases inflammation. This medicine may be used for other purposes; ask your health care provider or pharmacist if you have questions. COMMON BRAND NAME(S): RINVOQ What should I tell my care team before I take this medication? They need to know if you have any of these conditions: Blood clots Cancer Current or past tobacco use Diabetes Have had a heart attack or stroke Heart disease HIV or AIDs Immune system problems Infection or have had an infection that does not go away, such as tuberculosis (TB), shingles, or other bacterial, fungal, or viral infections Kidney disease Live or have traveled to the [facility] [place] or the [place] or [place] Liver disease, such as hepatitis Low blood cell levels (white cells, red cells, and platelets) Lung or breathing disease, such as asthma or COPD Recent or upcoming vaccine Stomach or intestine problems Taking NSAIDs, medications for pain and inflammation, such as ibuprofen or naproxen Taking steroid medications, such as prednisone or cortisone An unusual or allergic reaction to upadacitinib, other medications, foods, dyes, or preservatives Pregnant or trying to get pregnant Breastfeeding How should I use this medication? Take this medication by mouth with water. Take it as directed on the prescription label at the same time every day. Do not cut, crush, or chew this medication. Swallow the tablets whole. You can take it with or without food. If it upsets your stomach, take it with food. Keep taking it unless your care  team tells you to stop. Do not take this medication with grapefruit juice. A special MedGuide will be given to you by the pharmacist with each prescription and refill. Be sure to read this information carefully each time. Talk to your care team about the use of this medication in children. While it may be prescribed for children as young as 2 years for selected conditions, precautions do apply. Overdosage: If you think you have taken too much of this medicine contact a poison control center or emergency room at once. NOTE: This medicine is only for you. Do not share this medicine with others. What if I miss a dose? If you miss a dose, take it as soon as you can. If it is almost time for your next dose, take only that dose. Do not take double or extra doses. What may interact with this medication? Certain antivirals for HIV or hepatitis Certain medications for fungal infections, such as ketoconazole, itraconazole, posaconazole, voriconazole Certain medications for seizures, such as carbamazepine, phenobarbital, phenytoin Clarithromycin Grapefruit and grapefruit juice Live virus vaccines Medications that lower your chance of fighting infection, such as abatacept, adalimumab, azathioprine, cyclosporine, etanercept, infliximab, rituximab Rifampin Supplements, such as St. John's wort Other medications may affect the way this medication works. Talk with your care team about all of the medications you take. They may suggest changes to your treatment plan to lower the risk of side effects and to make sure your medications work as intended. This list may not describe all possible interactions. Give your health care provider a list of all the medicines, herbs, non-prescription drugs, or dietary supplements you use. Also tell them if you smoke, drink alcohol, or use illegal  drugs. Some items may interact with your medicine. What should I watch for while using this medication? Visit your care team for  regular checks on your progress. Tell your care team if your symptoms do not start to get better or if they get worse. You may need blood work done while you are taking this medication. This medication may increase your risk of getting an infection. Call your care team for advice if you get a fever, chills, sore throat, or other symptoms of a cold or flu. Do not treat yourself. Try to avoid being around people who are sick. Your care team will screen you for tuberculosis (TB) before you start this medication. If they think you are at risk, you may be treated with medication for TB. You should start taking the medication for TB before you start this medication. Make sure to finish the full course of TB medication. Avoid taking medications that contain aspirin, acetaminophen, ibuprofen, naproxen, or ketoprofen unless instructed by your care team. Talk to your care team about your risk of cancer. You may be more at risk for certain types of cancer if you take this medication. This medication can make you more sensitive to the sun. Keep out of the sun. If you cannot avoid being in the sun, wear protective clothing and sunscreen. Do not use sun lamps, tanning beds, or tanning booths. Tell your care team right away if you have any change in your eyesight. Talk to your care team if you often see part of the tablet in your stool. Talk to your care team if you may be pregnant. Serious birth defects can occur if you take this medication during pregnancy and for 4 weeks after the last dose. You will need a negative pregnancy test before starting this medication. Contraception is recommended while taking this medication and for 4 weeks after the last dose. Your care team can help you find the option that works for you. Do not breastfeed while taking this medication and for 6 days after the last dose. What side effects may I notice from receiving this medication? Side effects that you should report to your care team  as soon as possible: Allergic reactions--skin rash, itching, hives, swelling of the face, lips, tongue, or throat Blood clot--pain, swelling, or warmth in the leg, shortness of breath, chest pain Change in vision Heart attack--pain or tightness in the chest, shoulders, arms, or jaw, nausea, shortness of breath, cold or clammy skin, feeling faint or lightheaded Infection--fever, chills, cough, sore throat, wounds that don't heal, pain or trouble when passing urine, general feeling of discomfort or being unwell Liver injury--right upper belly pain, loss of appetite, nausea, light-colored stool, dark yellow or brown urine, yellowing skin or eyes, unusual weakness or fatigue Low red blood cell level--unusual weakness or fatigue, dizziness, headache, trouble breathing Stomach pain that is severe, does not go away, or gets worse Stroke--sudden numbness or weakness of the face, arm, or leg, trouble speaking, confusion, trouble walking, loss of balance or coordination, dizziness, severe headache, change in vision Side effects that usually do not require medical attention (report these to your care team if they continue or are bothersome): Acne Cough Headache Nausea Runny or stuffy nose This list may not describe all possible side effects. Call your doctor for medical advice about side effects. You may report side effects to FDA at 1-800-FDA-1088. Where should I keep my medication? Keep out of the reach of children and pets. Store at room temperature between 20  and 25 degrees C (68 and 77 degrees F). Protect from moisture. Keep the container tightly closed. Keep this medication in the original container until you are ready to take it. Get rid of any unused medication after the expiration date. To get rid of medications that are no longer needed or have expired: Take the medication to a medication take-back program. Check with your pharmacy or law enforcement to find a location. If you cannot return the  medication, check the label or package insert to see if the medication should be thrown out in the garbage or flushed down the toilet. If you are not sure, ask your care team. If it is safe to put it in the trash, empty the medication out of the container. Mix the medication with cat litter, dirt, coffee grounds, or other unwanted substance. Seal the mixture in a bag or container. Put it in the trash. NOTE: This sheet is a summary. It may not cover all possible information. If you have questions about this medicine, talk to your doctor, pharmacist, or health care provider.  2024 Elsevier/Gold Standard (2023-02-22 00:00:00)  Standing Labs We placed an order today for your standing lab work.   Please have your standing labs drawn in 1 month after Rinvoq and then every 3 months  Please have your labs drawn 2 weeks prior to your appointment so that the provider can discuss your lab results at your appointment, if possible.  Please note that you may see your imaging and lab results in MyChart before we have reviewed them. We will contact you once all results are reviewed. Please allow our office up to 72 hours to thoroughly review all of the results before contacting the office for clarification of your results.  WALK-IN LAB HOURS  Monday through Thursday from 8:00 am -12:30 pm and 1:00 pm-5:00 pm and Friday from 8:00 am-12:00 pm.  Patients with office visits requiring labs will be seen before walk-in labs.  You may encounter longer than normal wait times. Please allow additional time. Wait times may be shorter on  Monday and Thursday afternoons.  We do not book appointments for walk-in labs. We appreciate your patience and understanding with our staff.   Labs are drawn by [facility]. Please bring your co-pay at the time of your lab draw.  You may receive a bill from [facility] for your lab work.  Please note if you are on Hydroxychloroquine and and an order has been placed for a Hydroxychloroquine level,   you will need to have it drawn 4 hours or more after your last dose.  If you wish to have your labs drawn at another location, please call the office 24 hours in advance so we can fax the orders.  The office is located at [address], [place], [place] [address]   If you have any questions regarding directions or hours of operation,  please call [phone].   As a reminder, please drink plenty of water prior to coming for your lab work. Thanks!   Vaccines You are taking a medication(s) that can suppress your immune system.  The following immunizations are recommended: Flu annually Covid-19  RSV Td/Tdap (tetanus, diphtheria, pertussis) every 10 years Pneumonia (Prevnar 15 then Pneumovax 23 at least 1 year apart.  Alternatively, can take Prevnar 20 without needing additional dose) Shingrix: 2 doses from 4 weeks to 6 months apart  Please check with your PCP to make sure you are up to date.   If you have  signs or symptoms of an infection or start antibiotics: First, call your PCP for workup of your infection. Hold your medication through the infection, until you complete your antibiotics, and until symptoms resolve if you take the following: Injectable medication (Actemra, Benlysta, Cimzia, Cosentyx, Enbrel, Humira, Kevzara, Orencia, Remicade, Simponi, Stelara, Taltz, Tremfya) Methotrexate Leflunomide (Arava) Mycophenolate (Cellcept) Osborne O[place], or Rinvoq  Because you are taking Harriette Ohara, Rinvoq, or Olumiant, it is very important to know that this class of medications has a FDA BLACK BOX WARNING for major adverse cardiovascular events (MACE), thrombosis, mortality (including sudden cardiovascular death), serious infections, and lymphomas. MACE is defined as cardiovascular death, myocardial infarction, and stroke. Thrombosis includes deep venous thrombosis (DVT), pulmonary embolism (PE), and arterial thrombosis. If you are a current or former smoker, you are at higher  risk for MACE.

## 2023-08-31 ENCOUNTER — Other Ambulatory Visit: Payer: Self-pay | Admitting: *Deleted

## 2023-08-31 ENCOUNTER — Ambulatory Visit (HOSPITAL_BASED_OUTPATIENT_CLINIC_OR_DEPARTMENT_OTHER): Payer: Medicare Other | Admitting: Physical Therapy

## 2023-08-31 DIAGNOSIS — E559 Vitamin D deficiency, unspecified: Secondary | ICD-10-CM

## 2023-08-31 MED ORDER — VITAMIN D (ERGOCALCIFEROL) 1.25 MG (50000 UNIT) PO CAPS: 50000.0000 [IU] | ORAL_CAPSULE | ORAL | 0 refills | Status: DC

## 2023-08-31 NOTE — Telephone Encounter (Signed)
Submitted a Prior Authorization request to [facility] for [facility] via CoverMyMeds. Will update once we receive a response.  Key: [id number]  Chesley Mires, PharmD, MPH, BCPS, CPP Clinical Pharmacist (Rheumatology and Pulmonology)

## 2023-08-31 NOTE — Telephone Encounter (Signed)
-----   Message from [facility] sent at 08/31/2023  8:23 AM EST ----- Hemoglobin is low at 11.6.  Patient should take multivitamin with iron.  Sedimentation rate is elevated at 55.  Vitamin D level low at 17.  Please send prescription for vitamin D 50,000 units twice a week for 3 months.  Repeat vitamin D in 3 months.   She should take vitamin D 2000 units after that.  Triglycerides are elevated and HDL is low.  CMP and CK are normal.  Immunoglobulins are normal.  Rest the labs are pending (SPEP, TB gold, hepatitis panel, RF, anti-CCP, ANA, G6PD)

## 2023-08-31 NOTE — Progress Notes (Signed)
Hemoglobin is low at 11.6.  Patient should take multivitamin with iron.  Sedimentation rate is elevated at 55.  Vitamin D level low at 17.  Please send prescription for vitamin D 50,000 units twice a week for 3 months.  Repeat vitamin D in 3 months.  She should take vitamin D 2000 units after that.  Triglycerides are elevated and HDL is low.  CMP and CK are normal.  Immunoglobulins are normal.  Rest the labs are pending (SPEP, TB gold, hepatitis panel, RF, anti-CCP, ANA, G6PD)

## 2023-09-01 ENCOUNTER — Other Ambulatory Visit (HOSPITAL_COMMUNITY): Payer: Self-pay

## 2023-09-01 NOTE — Telephone Encounter (Signed)
Received notification from [facility] regarding a prior authorization for [facility]. Authorization has been APPROVED from 08/31/23 to 02/28/24. Approval letter sent to scan center.  Unable to run test claim because patient has a primary coverage to her Medicare    Authorization # [id number] Phone # [phone]  Submitted a Prior Authorization request to [employer] for [facility] via CoverMyMeds. Will update once we receive a response.  Key: [facility]   Per autoated response:  [employer] does not manage PA for this patient. Please contact the number on the back of the members card for further assistance  Submitted via [employer] portal as URGENT request EOC ID: [id number]

## 2023-09-02 ENCOUNTER — Telehealth: Payer: Self-pay

## 2023-09-02 ENCOUNTER — Ambulatory Visit: Payer: Commercial Managed Care - PPO | Attending: Family Medicine

## 2023-09-02 DIAGNOSIS — R262 Difficulty in walking, not elsewhere classified: Secondary | ICD-10-CM | POA: Insufficient documentation

## 2023-09-02 DIAGNOSIS — M6281 Muscle weakness (generalized): Secondary | ICD-10-CM | POA: Diagnosis not present

## 2023-09-02 DIAGNOSIS — R293 Abnormal posture: Secondary | ICD-10-CM | POA: Insufficient documentation

## 2023-09-02 DIAGNOSIS — M0579 Rheumatoid arthritis with rheumatoid factor of multiple sites without organ or systems involvement: Secondary | ICD-10-CM | POA: Diagnosis not present

## 2023-09-02 DIAGNOSIS — M79602 Pain in left arm: Secondary | ICD-10-CM | POA: Insufficient documentation

## 2023-09-02 DIAGNOSIS — M5459 Other low back pain: Secondary | ICD-10-CM | POA: Diagnosis not present

## 2023-09-02 DIAGNOSIS — M25562 Pain in left knee: Secondary | ICD-10-CM | POA: Insufficient documentation

## 2023-09-02 DIAGNOSIS — M79605 Pain in left leg: Secondary | ICD-10-CM

## 2023-09-02 DIAGNOSIS — R252 Cramp and spasm: Secondary | ICD-10-CM

## 2023-09-02 DIAGNOSIS — G8929 Other chronic pain: Secondary | ICD-10-CM | POA: Diagnosis not present

## 2023-09-02 NOTE — Telephone Encounter (Signed)
Patient contacted the office and states Dr. Corliss Skains had put in orders for the patient to get an xray at the hospital. Patient states she fell about a week ago before she was here and caught herself with her left elbow. Patient inquires if Dr. Corliss Skains can add left elbow xrays to the other xray order that is already in for the hospital. Please advise.   Patient states she was started on Prednisone. Patient states she has nerve numbness and pain due to lower back problems and RA. Patient inquires if she can be referred to a neurologist. Patient states she would like to stay in [place] and in the [facility] system if possible. Please advise.

## 2023-09-02 NOTE — Telephone Encounter (Signed)
[facility] called patient wanting RX for Rinvoq sent for home delivery. Please advise.

## 2023-09-02 NOTE — Telephone Encounter (Signed)
Received fax from [employer] request specific details on previously tried treatments and duration  Patient has transferred care so only have vague year-by-year history of medications tried.  Sent notes via email to [email or web address]  Caitlin Wagner, PharmD, MPH, BCPS, CPP Clinical Pharmacist (Rheumatology and Pulmonology)

## 2023-09-02 NOTE — Therapy (Signed)
OUTPATIENT PHYSICAL THERAPY THORACOLUMBAR TREATMENT   Patient Name: Caitlin Wagner MRN: [id number] DOB:October 02, 1966, 57 y.o., female Today's Date: 09/02/2023  END OF SESSION:  PT End of Session - 09/02/23 0941     Visit Number 8    Date for PT Re-Evaluation 09/22/23    Authorization Type [employer]    Progress Note Due on Visit 10    PT Start Time [phone]    PT Stop Time 1016    PT Time Calculation (min) 38 min    Activity Tolerance Patient tolerated treatment well    Behavior During Therapy [facility] for tasks assessed/performed              Past Medical History:  Diagnosis Date   Arthritis    RA   ASCUS of cervix with negative high risk HPV 10/2017   High risk HPV infection 03/2012   HSV (herpes simplex virus) anogenital infection 12/2016   LGSIL (low grade squamous intraepithelial dysplasia) 2013/2017   03/2012 positive high risk HPV,  09/2015 with negative high-risk HPV   Rheumatoid arthritis(714.0)    Spinal stenosis    Past Surgical History:  Procedure Laterality Date   CERVICAL SPINE SURGERY     08/1992 x2 surgeries 1 week apart.   feet reconstruction surgery     bilateral    I & D KNEE WITH POLY EXCHANGE Left 04/11/2018   Procedure: REVISION LEFT TOTAL KNEE- POLY EXCHANGE;  Surgeon: Samson Frederic, MD;  Location: [facility] OR;  Service: Orthopedics;  Laterality: Left;  Needs RNFA   LUMBAR FUSION  02/2023   TONSILLECTOMY  age 32   TOTAL HIP ARTHROPLASTY Right 12/2020   TOTAL HIP ARTHROPLASTY Left 12/2022   TOTAL KNEE ARTHROPLASTY Left 03/22/2017   Procedure: TOTAL KNEE ARTHROPLASTY w/ Navigation;  Surgeon: Samson Frederic, MD;  Location: [facility] OR;  Service: Orthopedics;  Laterality: Left;   TOTAL KNEE ARTHROPLASTY Right 05/10/2017   Procedure: TOTAL KNEE ARTHROPLASTY;  Surgeon: Samson Frederic, MD;  Location: [facility] OR;  Service: Orthopedics;  Laterality: Right;   TOTAL KNEE REVISION Left 07/2020   level 3   Patient Active Problem List   Diagnosis Date Noted   Failed total knee,  left, initial encounter (HCC) 04/11/2018   Degenerative arthritis of right knee 05/10/2017   Degenerative joint disease of right knee 05/10/2017   Rheumatoid arthritis (HCC)     PCP: Barbie Banner, MD   REFERRING PROVIDER: Stamey, Verda Cumins, FNP  REFERRING DIAG:  Diagnosis  M06.9 (ICD-10-CM) - Rheumatoid arthritis involving multiple sites, unspecified whether rheumatoid factor present    Rationale for Evaluation and Treatment: Rehabilitation  THERAPY DIAG:  Cramp and spasm  Difficulty in walking, not elsewhere classified  Abnormal posture  Muscle weakness (generalized)  Other low back pain  Rheumatoid arthritis involving multiple sites with positive rheumatoid factor (HCC)  Chronic pain of left knee  Pain in left leg  ONSET DATE: 07/27/2023  SUBJECTIVE:  SUBJECTIVE STATEMENT: Aquatic therapy is going great. Patient is having a rough day. Her low back and Lt hip is bothering her. Currently she is having 10/10 pain.    From initial evaluation: Patient reports she has been diagnosed with RA for approx 20 years.  She has undergone multiple joint replacements and fusion surgeries.  Her two most recent surgeries include lumbar fusion and left hip replacement.  She was not able to do any PT after her lumbar fusion before she needed her hip replacement.  She feels she is losing the ability to be functional due to the severity of the pain and mobility issues.  She understands that she has developed tightness and weakness from not being able to do much in the way of activity for the past few months.  She hopes to be able to stay functional and care for herself.    PERTINENT HISTORY:  RA x 20 years  PAIN: 08/23/2023 Are you having pain? Yes: NPRS scale: 10/10 Pain location:generalized  Pain  description: nerve pain and RA Aggravating factors: cold fronts coming through, any activity during RA flare Relieving factors: meds, rest  PRECAUTIONS: Other: RA for 20 years, multiple deformities and surgeries, ulnar drift  RED FLAGS: None   WEIGHT BEARING RESTRICTIONS: No  FALLS:  Has patient fallen in last 6 months? No  LIVING ENVIRONMENT: Lives with: lives with their family Lives in: House/apartment Stairs: No Has following equipment at home: Single point cane  OCCUPATION: disabled  PLOF: Independent, Independent with basic ADLs, Independent with household mobility with device, Independent with community mobility with device, Independent with homemaking with ambulation, Independent with gait, Independent with transfers, and Needs assistance with homemaking  PATIENT GOALS: She hopes to be able to stay functional and care for herself.   NEXT MD VISIT: prn  OBJECTIVE:  Note: Objective measures were completed at Evaluation unless otherwise noted.  DIAGNOSTIC FINDINGS:  na    SCREENING FOR RED FLAGS: Bowel or bladder incontinence: No Spinal tumors: No Cauda equina syndrome: No Compression fracture: No Abdominal aneurysm: No  COGNITION: Overall cognitive status: Within functional limits for tasks assessed     SENSATION: WFL  MUSCLE LENGTH: Hamstrings: Right approx 45 degrees deg; Left approx 45 deg Thomas test: Right pos; Left pos  POSTURE: rounded shoulders, increased lumbar lordosis, right pelvic obliquity, and acquired scoliosis  PALPATION: na  LUMBAR ROM:  Initial eval:   Held on testing due to severe pain: will assess as pain level decreases  AROM eval  Flexion   Extension   Right lateral flexion   Left lateral flexion   Right rotation   Left rotation    (Blank rows = not tested)  LOWER EXTREMITY ROM:     Numerous joint limitations and ROM deficits but she does have full overhead reach bilateral shoulders  LOWER EXTREMITY MMT:     Generally 4 to 4+ out of 5 in available range  LUMBAR SPECIAL TESTS:  Deferred due to recent surgery  FUNCTIONAL TESTS:  5 times sit to stand: 31.12 sec Timed up and go (TUG): 23.62 sec  GAIT: Distance walked: 30 feet Assistive device utilized: Single point cane Level of assistance: SBA Comments: antalgic, abnormal  TODAY'S TREATMENT:    DATE:  09/02/2023 NuStep Level 3 x 5 minutes - PT present to discuss progress Attempted standing hamstring stretch but this was too taxing on the stance leg  Seated toe raises x 20  Seated heel raises x 20 Seated hamstring stretch 2 x 30  sec Seated left quad/hip flexor stretch 2 x 30 sec Supine LTR 5 x 20 sec holds Supine clamshell x 20 with blue loop Bridging 2 x 10 Supine PPT x 20 Supine PPT with march x 20 Supine PPT with heel slide 2 x 10 Supine PPT with supine hip abduction 2 x 10   08/23/2023 NuStep Level 1 5 minutes - PT present to discuss progress Manual: P/ROM Lt hamstring in seated + STM to distal hamstring  Seated hamstring stretch 2 x 30 sec Standing hip flexor stretch 2 x 30sec Supine LTR 2 x 20 sec holds Supine Figure Four stretch Rt 2 x 30sec Supine single knee to chest with PT providing over pressure 2 x 20 sec each Education on muscle soreness vs nerve pain vs RA pain   08/19/23 Pt seen for aquatic therapy today.  Treatment took place in water 3.5-4.75 ft in depth at the [facility] pool. Temp of water was 86.  Pt entered/exited the pool via stairs independently with bilat rail.  Pt feels better facing lap pool due to leg length difference.   * without support: walking forward/ backwards with cues for vertical trunk, * side stepping with arm addct/ abdct x 3 laps * marching forward/ backward * Ai chi postures:  (10 of each) contemplating, floating, uplifting, enclosing, folding, soothing, gathering, freeing * return to walking with arm swing and breast stroke arms    DATE: 08/16/23 Pt seen for  aquatic therapy today.  Treatment took place in water 3.5-4.75 ft in depth at the [facility] pool. Temp of water was 91.  Pt entered/exited the pool via stairs independently with bilat rail.  Pt feels better facing lap pool due to leg length difference.   * without support: walking forward/ backwards with cues for vertical trunk,3 laps * side stepping with arm addct/ abdct x 2 laps -> side step into squat x 1 lap * staggered stance with shoulder addct/ abdct, horiz abdct/ addct * wall push up x 2- not tolerated due to Lt elbow pain * Ai chi postures:  (~5 of each) contemplating, floating, uplifting, enclosing, folding, soothing, gathering, freeing * straddling yellow noodle with breast stroke arms- cycling with legs suspended hip abdct/ addct, return to cycling    DATE: 08/12/23 Pt seen for aquatic therapy today.  Treatment took place in water 3.5-4.75 ft in depth at the [facility] pool. Temp of water was 91.  Pt entered/exited the pool via stairs independently with bilat rail.  Pt feels better facing lap pool due to leg length difference.   * without support: walking forward/ backwards with cues for vertical trunk, reduced but even step length, - multiple laps * queen nods to tolerance (slight rotation with nod; painful to L); mild Rt levator stretch  * side stepping with arm addct/ abdct x 3 laps * farmer carry with short hollow noodle at side - walking forward/ backward * standard stance with TrA set and short hollow noodle pull down to thighs x 6 ( stopped due to pain in L elbow) * straddling yellow noodle with breast stroke arms- cycling with legs suspended hip abdct/ addct, return to cycling  * seated on bench in water with feet on blue step: STS with forward arm reach, cues for hip hinge and controlled descent x 6     PATIENT EDUCATION:  Education details: aquatic therapy exercise progressions and modifications. Issued Ai Chi postures.  Person educated:  Patient Education method: Explanation Education comprehension: verbalized understanding  HOME EXERCISE PROGRAM: Access Code: [email or web address] URL: [email or web address] Date: 08/23/2023 Prepared by: Claude Manges  Exercises - Supine Lower Trunk Rotation  - 1-2 x daily - 7 x weekly - 2 sets - 2 reps - 20-30 hold - Supine Single Knee to Chest Stretch  - 1-2 x daily - 7 x weekly - 2 sets - 20-30 hold - Seated Hamstring Stretch  - 1-2 x daily - 7 x weekly - 2 sets - 20-30 hold - Standing Hip Flexor Stretch  - 1-2 x daily - 7 x weekly - 2 sets - 15-20 hold  ASSESSMENT:  CLINICAL IMPRESSION:  Anari has been responding well aquatic therapy. She will be starting new medication if approved.  Her pain is still elevated but lower today and she was able to tolerate more.  She became tearful with supine hip abduction on the right LE.  She reported burning and pain in both legs with this.  We had her discontinue this and try the left LE to see if she had similar symptoms but she was able to do the left with no issues.  She was able to do more exercises today with only minimal fatigue with the exception of right hip abduction.  We discussed how to modify her exercises down if she was having a painful day.  She is knowledgeably and fairly compliant with her HEP.   Patient will benefit from skilled PT to address the below impairments and improve overall function.    From initial evaluation: Patient is a 57 y.o. female who was seen today for physical therapy evaluation and treatment for post lumbar fusion and left THA.   She has underlying severe RA with multiple joint deformities and joint replacements.  She is fused at c spine as well.  She presents with decreased ROM and poor flexibility but generally good strength.  She would benefit from skilled PT in aquatics program initially to gain some control of her pain.  We will transition to land and pool after 4 weeks to see if she will be able to tolerate  land based therapy with more manageable pain.    OBJECTIVE IMPAIRMENTS: decreased mobility, difficulty walking, decreased ROM, decreased strength, increased fascial restrictions, increased muscle spasms, impaired flexibility, impaired UE functional use, postural dysfunction, and pain.   ACTIVITY LIMITATIONS: carrying, lifting, bending, sitting, standing, squatting, sleeping, stairs, transfers, bed mobility, bathing, toileting, dressing, and hygiene/grooming  PARTICIPATION LIMITATIONS: meal prep, cleaning, laundry, driving, shopping, community activity, yard work, and church  PERSONAL FACTORS: Time since onset of injury/illness/exacerbation and 1-2 comorbidities: RA  are also affecting patient's functional outcome.   REHAB POTENTIAL: Fair Severity of RA and joint issues along with multiple sugeries may impede progress  CLINICAL DECISION MAKING: Evolving/moderate complexity  EVALUATION COMPLEXITY: Moderate   GOALS: Goals reviewed with patient? Yes  SHORT TERM GOALS: Target date: 08/25/2023   Pain report to be no greater than 6/10  Baseline: Goal status: In progress - 08/19/23  2.  Patient will be independent with initial HEP of aquatic exercises Baseline:  Goal status: In progress - 08/19/23  3.  Patient to be able to walk independently from parking lot of [facility] to pool independently without rest breaks using any assistive device  Baseline:  Goal status: In progress - 08/16/23    LONG TERM GOALS: Target date: 09/22/2023   Pain report to be no greater than 4/10  Baseline:  Goal status: INITIAL  2.  Patient to be independent with advanced HEP  Baseline:  Goal status: INITIAL  3.  Patient to be able to ascend and descend steps without pain or no greater than 2/10  Baseline:  Goal status: INITIAL  4.  Patient to be able to bend, stoop and squat with pain no greater than 2/10  Baseline:  Goal status: INITIAL  5.  Patient to be able to sleep through the  night  Baseline:  Goal status: INITIAL  6.  Patient to report 85% improvement in overall symptoms Baseline:  Goal status: INITIAL  PLAN:  PT FREQUENCY: 1-2x/week  PT DURATION: 8 weeks  PLANNED INTERVENTIONS: 97146- PT Re-evaluation, 97110-Therapeutic exercises, 97530- Therapeutic activity, 97112- Neuromuscular re-education, 97535- Self Care, 16109- Manual therapy, [id number]- Gait training, [phone]- Canalith repositioning, [id number]- Aquatic Therapy, 97760- Splinting, 97014- Electrical stimulation (unattended), [id number]- Electrical stimulation (manual), [id number]- Vasopneumatic device, [id number]- Ultrasound, [id number]- Ionotophoresis 4mg /ml Dexamethasone, Patient/Family education, Balance training, Stair training, Taping, Dry Needling, Joint mobilization, Spinal mobilization, Scar mobilization, Vestibular training, Visual/preceptual remediation/compensation, DME instructions, Cryotherapy, Moist heat, Therapeutic exercises, Therapeutic activity, Neuromuscular re-education, Gait training, and Self Care.  PLAN FOR NEXT SESSION: Aquatic therapy and land based therapy  x 4 weeks; assess HEP & pain levels   Edeline Greening B. Ajai Terhaar, PT 09/02/23 11:31 AM [facility] [address] [place], [place] [address] Phone # [phone] Fax [phone]

## 2023-09-02 NOTE — Telephone Encounter (Signed)
Rinvoq can be started after September 07, 2023.  We can send the prescription for Rinvoq 15 mg p.o. daily.  Patient was advised to discontinue Cimzia.  We will get labs in 1 month and then every 3 months to monitor for drug toxicity.

## 2023-09-02 NOTE — Telephone Encounter (Signed)
Okay to order left elbow x-rays for left elbow injury.   please refer her to neurology for lower back pain and lower extremity numbness.

## 2023-09-03 ENCOUNTER — Other Ambulatory Visit: Payer: Self-pay | Admitting: *Deleted

## 2023-09-03 ENCOUNTER — Other Ambulatory Visit (HOSPITAL_COMMUNITY): Payer: Self-pay

## 2023-09-03 DIAGNOSIS — M48062 Spinal stenosis, lumbar region with neurogenic claudication: Secondary | ICD-10-CM

## 2023-09-03 DIAGNOSIS — S59902A Unspecified injury of left elbow, initial encounter: Secondary | ICD-10-CM

## 2023-09-03 MED ORDER — RINVOQ 15 MG PO TB24: 15.0000 mg | ORAL_TABLET | Freq: Every day | ORAL | 0 refills | Status: DC

## 2023-09-03 NOTE — Telephone Encounter (Signed)
I called patient, x-rays ordered, referral for neurology placed

## 2023-09-03 NOTE — Telephone Encounter (Signed)
Received notification from [facility] regarding a prior authorization for [facility]. Authorization has been APPROVED from 09/02/23 to 03/01/24. Approval letter sent to scan center.  Unable to run test claim because patient must fill through [facility]: [phone]  Authorization # [id number]  Patient has Medicare as secondary apparently.  Rx sent to [facility] today. MyChart message sent to patient with update on prescription  Chesley Mires, PharmD, MPH, BCPS, CPP Clinical Pharmacist (Rheumatology and Pulmonology)

## 2023-09-04 LAB — QUANTIFERON-TB GOLD PLUS
Mitogen-NIL: 5.46 [IU]/mL
NIL: 0.05 [IU]/mL
QuantiFERON-TB Gold Plus: NEGATIVE
TB1-NIL: 0 [IU]/mL
TB2-NIL: <0 [IU]/mL

## 2023-09-04 LAB — CBC WITH DIFFERENTIAL/PLATELET
Absolute Lymphocytes: 1702 {cells}/uL (ref 850–3900)
Absolute Monocytes: 414 {cells}/uL (ref 200–950)
Basophils Absolute: 28 {cells}/uL (ref 0–200)
Basophils Relative: 0.3 %
Eosinophils Absolute: 0 {cells}/uL — ABNORMAL LOW (ref 15–500)
Eosinophils Relative: 0 %
HCT: 37.2 % (ref 35.0–45.0)
Hemoglobin: 11.6 g/dL — ABNORMAL LOW (ref 11.7–15.5)
MCH: 26.7 pg — ABNORMAL LOW (ref 27.0–33.0)
MCHC: 31.2 g/dL — ABNORMAL LOW (ref 32.0–36.0)
MCV: 85.5 fL (ref 80.0–100.0)
MPV: 9.6 fL (ref 7.5–12.5)
Monocytes Relative: 4.5 %
Neutro Abs: 7056 {cells}/uL (ref 1500–7800)
Neutrophils Relative %: 76.7 %
Platelets: 382 10*3/uL (ref 140–400)
RBC: 4.35 10*6/uL (ref 3.80–5.10)
RDW: 14.5 % (ref 11.0–15.0)
Total Lymphocyte: 18.5 %
WBC: 9.2 10*3/uL (ref 3.8–10.8)

## 2023-09-04 LAB — SEDIMENTATION RATE: Sed Rate: 55 mm/h — ABNORMAL HIGH (ref 0–30)

## 2023-09-04 LAB — HEPATITIS C ANTIBODY: Hepatitis C Ab: NONREACTIVE

## 2023-09-04 LAB — PROTEIN ELECTROPHORESIS, SERUM, WITH REFLEX
Albumin ELP: 3.7 g/dL — ABNORMAL LOW (ref 3.8–4.8)
Alpha 1: 0.5 g/dL — ABNORMAL HIGH (ref 0.2–0.3)
Alpha 2: 1.1 g/dL — ABNORMAL HIGH (ref 0.5–0.9)
Beta 2: 0.4 g/dL (ref 0.2–0.5)
Beta Globulin: 0.5 g/dL (ref 0.4–0.6)
Gamma Globulin: 1 g/dL (ref 0.8–1.7)
Total Protein: 7.2 g/dL (ref 6.1–8.1)

## 2023-09-04 LAB — LIPID PANEL
Cholesterol: 135 mg/dL (ref <200)
HDL: 43 mg/dL — ABNORMAL LOW (ref >=50)
LDL Cholesterol (Calc): 69 mg/dL
Non-HDL Cholesterol (Calc): 92 mg/dL (ref <130)
Total CHOL/HDL Ratio: 3.1 (calc) (ref <5.0)
Triglycerides: 156 mg/dL — ABNORMAL HIGH (ref <150)

## 2023-09-04 LAB — COMPLETE METABOLIC PANEL WITH GFR
AG Ratio: 1.3 (calc) (ref 1.0–2.5)
ALT: 10 U/L (ref 6–29)
AST: 13 U/L (ref 10–35)
Albumin: 4 g/dL (ref 3.6–5.1)
Alkaline phosphatase (APISO): 83 U/L (ref 37–153)
BUN: 12 mg/dL (ref 7–25)
CO2: 24 mmol/L (ref 20–32)
Calcium: 9.6 mg/dL (ref 8.6–10.4)
Chloride: 104 mmol/L (ref 98–110)
Creat: 0.5 mg/dL (ref 0.50–1.03)
Globulin: 3.2 g/dL (ref 1.9–3.7)
Glucose, Bld: 66 mg/dL (ref 65–99)
Potassium: 4.6 mmol/L (ref 3.5–5.3)
Sodium: 141 mmol/L (ref 135–146)
Total Bilirubin: 0.2 mg/dL (ref 0.2–1.2)
Total Protein: 7.2 g/dL (ref 6.1–8.1)
eGFR: 111 mL/min/{1.73_m2} (ref >=60)

## 2023-09-04 LAB — IGG, IGA, IGM
IgG (Immunoglobin G), Serum: 1003 mg/dL (ref 600–1640)
IgM, Serum: 203 mg/dL (ref 50–300)
Immunoglobulin A: 292 mg/dL (ref 47–310)

## 2023-09-04 LAB — VITAMIN D 25 HYDROXY (VIT D DEFICIENCY, FRACTURES): Vit D, 25-Hydroxy: 17 ng/mL — ABNORMAL LOW (ref 30–100)

## 2023-09-04 LAB — ANA: Anti Nuclear Antibody (ANA): POSITIVE — AB

## 2023-09-04 LAB — GLUCOSE 6 PHOSPHATE DEHYDROGENASE: G-6PDH: 23.5 U/g{Hb} — ABNORMAL HIGH (ref 7.0–20.5)

## 2023-09-04 LAB — RHEUMATOID FACTOR: Rheumatoid fact SerPl-aCnc: 608 [IU]/mL — ABNORMAL HIGH (ref <14)

## 2023-09-04 LAB — HEPATITIS B SURFACE ANTIGEN
Confirmation: NONREACTIVE
Hepatitis B Surface Ag: REACTIVE — AB

## 2023-09-04 LAB — CK: Total CK: 41 U/L (ref 29–143)

## 2023-09-04 LAB — ANTI-NUCLEAR AB-TITER (ANA TITER): ANA Titer 1: 1:80 {titer} — ABNORMAL HIGH

## 2023-09-04 LAB — CYCLIC CITRUL PEPTIDE ANTIBODY, IGG: Cyclic Citrullin Peptide Ab: >250 U — ABNORMAL HIGH

## 2023-09-04 LAB — HEPATITIS B CORE ANTIBODY, IGM: Hep B C IgM: NONREACTIVE

## 2023-09-05 NOTE — Progress Notes (Signed)
RF positive, anti-CCP positive, sed rate elevated, SPEP normal, beta globulins normal, TB Gold negative, hepatitis B-, hepatitis C negative, ANA low titer positive, G6PD elevated and not significant.  Labs are consistent with rheumatoid arthritis.

## 2023-09-06 ENCOUNTER — Ambulatory Visit (HOSPITAL_COMMUNITY)
Admission: RE | Admit: 2023-09-06 | Discharge: 2023-09-06 | Disposition: A | Discharge status: Home / Self Care | Payer: Commercial Managed Care - PPO | Source: Ambulatory Visit | Attending: Rheumatology | Admitting: Rheumatology

## 2023-09-06 DIAGNOSIS — S59902A Unspecified injury of left elbow, initial encounter: Secondary | ICD-10-CM | POA: Diagnosis present

## 2023-09-06 DIAGNOSIS — M059 Rheumatoid arthritis with rheumatoid factor, unspecified: Secondary | ICD-10-CM | POA: Diagnosis present

## 2023-09-06 DIAGNOSIS — M546 Pain in thoracic spine: Secondary | ICD-10-CM | POA: Diagnosis present

## 2023-09-06 DIAGNOSIS — Z79899 Other long term (current) drug therapy: Secondary | ICD-10-CM | POA: Diagnosis present

## 2023-09-06 DIAGNOSIS — M069 Rheumatoid arthritis, unspecified: Secondary | ICD-10-CM | POA: Diagnosis not present

## 2023-09-06 NOTE — Progress Notes (Signed)
Low titer ANA is seen in rheumatoid arthritis.  If patient wants to come in we can do further testing on ANA which will include ENA panel, C3-C4

## 2023-09-07 ENCOUNTER — Ambulatory Visit (HOSPITAL_BASED_OUTPATIENT_CLINIC_OR_DEPARTMENT_OTHER): Payer: Self-pay | Admitting: Physical Therapy

## 2023-09-08 NOTE — Progress Notes (Signed)
Chest x-ray normal

## 2023-09-09 ENCOUNTER — Ambulatory Visit: Payer: Commercial Managed Care - PPO | Admitting: Physical Therapy

## 2023-09-09 ENCOUNTER — Encounter: Payer: Self-pay | Admitting: Physical Therapy

## 2023-09-09 DIAGNOSIS — G8929 Other chronic pain: Secondary | ICD-10-CM

## 2023-09-09 DIAGNOSIS — M0579 Rheumatoid arthritis with rheumatoid factor of multiple sites without organ or systems involvement: Secondary | ICD-10-CM | POA: Diagnosis not present

## 2023-09-09 DIAGNOSIS — M79602 Pain in left arm: Secondary | ICD-10-CM | POA: Diagnosis not present

## 2023-09-09 DIAGNOSIS — M5459 Other low back pain: Secondary | ICD-10-CM

## 2023-09-09 DIAGNOSIS — M79605 Pain in left leg: Secondary | ICD-10-CM

## 2023-09-09 DIAGNOSIS — R293 Abnormal posture: Secondary | ICD-10-CM | POA: Diagnosis not present

## 2023-09-09 DIAGNOSIS — M25562 Pain in left knee: Secondary | ICD-10-CM | POA: Diagnosis not present

## 2023-09-09 DIAGNOSIS — M6281 Muscle weakness (generalized): Secondary | ICD-10-CM

## 2023-09-09 DIAGNOSIS — R262 Difficulty in walking, not elsewhere classified: Secondary | ICD-10-CM

## 2023-09-09 DIAGNOSIS — R252 Cramp and spasm: Secondary | ICD-10-CM

## 2023-09-09 NOTE — Therapy (Signed)
OUTPATIENT PHYSICAL THERAPY THORACOLUMBAR TREATMENT   Patient Name: Caitlin Wagner MRN: [id number] DOB:06/11/1966, 57 y.o., female Today's Date: 09/09/2023  END OF SESSION:  PT End of Session - 09/09/23 1014     Visit Number 9    Date for PT Re-Evaluation 09/22/23    Authorization Type [employer]    Progress Note Due on Visit 10    PT Start Time 0931    PT Stop Time 1012    PT Time Calculation (min) 41 min    Activity Tolerance Patient tolerated treatment well    Behavior During Therapy [facility] for tasks assessed/performed               Past Medical History:  Diagnosis Date   Arthritis    RA   ASCUS of cervix with negative high risk HPV 10/2017   High risk HPV infection 03/2012   HSV (herpes simplex virus) anogenital infection 12/2016   LGSIL (low grade squamous intraepithelial dysplasia) 2013/2017   03/2012 positive high risk HPV,  09/2015 with negative high-risk HPV   Rheumatoid arthritis(714.0)    Spinal stenosis    Past Surgical History:  Procedure Laterality Date   CERVICAL SPINE SURGERY     08/1992 x2 surgeries 1 week apart.   feet reconstruction surgery     bilateral    I & D KNEE WITH POLY EXCHANGE Left 04/11/2018   Procedure: REVISION LEFT TOTAL KNEE- POLY EXCHANGE;  Surgeon: Samson Frederic, MD;  Location: [facility] OR;  Service: Orthopedics;  Laterality: Left;  Needs RNFA   LUMBAR FUSION  02/2023   TONSILLECTOMY  age 62   TOTAL HIP ARTHROPLASTY Right 12/2020   TOTAL HIP ARTHROPLASTY Left 12/2022   TOTAL KNEE ARTHROPLASTY Left 03/22/2017   Procedure: TOTAL KNEE ARTHROPLASTY w/ Navigation;  Surgeon: Samson Frederic, MD;  Location: [facility] OR;  Service: Orthopedics;  Laterality: Left;   TOTAL KNEE ARTHROPLASTY Right 05/10/2017   Procedure: TOTAL KNEE ARTHROPLASTY;  Surgeon: Samson Frederic, MD;  Location: [facility] OR;  Service: Orthopedics;  Laterality: Right;   TOTAL KNEE REVISION Left 07/2020   level 3   Patient Active Problem List   Diagnosis Date Noted   Failed total  knee, left, initial encounter (HCC) 04/11/2018   Degenerative arthritis of right knee 05/10/2017   Degenerative joint disease of right knee 05/10/2017   Rheumatoid arthritis (HCC)     PCP: Barbie Banner, MD   REFERRING PROVIDER: Stamey, Verda Cumins, FNP  REFERRING DIAG:  Diagnosis  M06.9 (ICD-10-CM) - Rheumatoid arthritis involving multiple sites, unspecified whether rheumatoid factor present    Rationale for Evaluation and Treatment: Rehabilitation  THERAPY DIAG:  Cramp and spasm  Abnormal posture  Difficulty in walking, not elsewhere classified  Muscle weakness (generalized)  Other low back pain  Pain in left leg  Chronic pain of left knee  Rheumatoid arthritis involving multiple sites with positive rheumatoid factor (HCC)  ONSET DATE: 07/27/2023  SUBJECTIVE:  SUBJECTIVE STATEMENT: Patient reports she is doing a lot better today. She is feeling a lot less inflamed.     From initial evaluation: Patient reports she has been diagnosed with RA for approx 20 years.  She has undergone multiple joint replacements and fusion surgeries.  Her two most recent surgeries include lumbar fusion and left hip replacement.  She was not able to do any PT after her lumbar fusion before she needed her hip replacement.  She feels she is losing the ability to be functional due to the severity of the pain and mobility issues.  She understands that she has developed tightness and weakness from not being able to do much in the way of activity for the past few months.  She hopes to be able to stay functional and care for herself.    PERTINENT HISTORY:  RA x 20 years  PAIN: 09/09/2023 Are you having pain? Yes: NPRS scale: 6/10 Pain location:generalized  Pain description: nerve pain and RA Aggravating factors:  cold fronts coming through, any activity during RA flare Relieving factors: meds, rest  PRECAUTIONS: Other: RA for 20 years, multiple deformities and surgeries, ulnar drift  RED FLAGS: None   WEIGHT BEARING RESTRICTIONS: No  FALLS:  Has patient fallen in last 6 months? No  LIVING ENVIRONMENT: Lives with: lives with their family Lives in: House/apartment Stairs: No Has following equipment at home: Single point cane  OCCUPATION: disabled  PLOF: Independent, Independent with basic ADLs, Independent with household mobility with device, Independent with community mobility with device, Independent with homemaking with ambulation, Independent with gait, Independent with transfers, and Needs assistance with homemaking  PATIENT GOALS: She hopes to be able to stay functional and care for herself.   NEXT MD VISIT: prn  OBJECTIVE:  Note: Objective measures were completed at Evaluation unless otherwise noted.  DIAGNOSTIC FINDINGS:  na    SCREENING FOR RED FLAGS: Bowel or bladder incontinence: No Spinal tumors: No Cauda equina syndrome: No Compression fracture: No Abdominal aneurysm: No  COGNITION: Overall cognitive status: Within functional limits for tasks assessed     SENSATION: WFL  MUSCLE LENGTH: Hamstrings: Right approx 45 degrees deg; Left approx 45 deg Thomas test: Right pos; Left pos  POSTURE: rounded shoulders, increased lumbar lordosis, right pelvic obliquity, and acquired scoliosis  PALPATION: na  LUMBAR ROM:  Initial eval:   Held on testing due to severe pain: will assess as pain level decreases  AROM eval  Flexion   Extension   Right lateral flexion   Left lateral flexion   Right rotation   Left rotation    (Blank rows = not tested)  LOWER EXTREMITY ROM:     Numerous joint limitations and ROM deficits but she does have full overhead reach bilateral shoulders  LOWER EXTREMITY MMT:    Generally 4 to 4+ out of 5 in available range  LUMBAR  SPECIAL TESTS:  Deferred due to recent surgery  FUNCTIONAL TESTS:  5 times sit to stand: 31.12 sec Timed up and go (TUG): 23.62 sec  GAIT: Distance walked: 30 feet Assistive device utilized: Single point cane Level of assistance: SBA Comments: antalgic, abnormal  TODAY'S TREATMENT:    DATE:  09/09/2023 NuStep Level 3 x 5 minutes - PT present to discuss progress Seated hamstring stretch 2 x 30 sec Supine LTR 5 x 20 sec holds Supine clamshell x 20 with blue loop Bridging 2 x 10 Supine PPT x 20 Supine PPT with march x 20 Supine TA contraction + Bent  knee fallout x 10 each Supine SLR x 10 each Sidelying hip abduction x 10 each bilateral - very challenging  Sidelying clamshells 2 x 10 each bilateral    09/02/2023 NuStep Level 3 x 5 minutes - PT present to discuss progress Attempted standing hamstring stretch but this was too taxing on the stance leg  Seated toe raises x 20  Seated heel raises x 20 Seated hamstring stretch 2 x 30 sec Seated left quad/hip flexor stretch 2 x 30 sec Supine LTR 5 x 20 sec holds Supine clamshell x 20 with blue loop Bridging 2 x 10 Supine PPT x 20 Supine PPT with march x 20    08/23/2023 NuStep Level 1 5 minutes - PT present to discuss progress Manual: P/ROM Lt hamstring in seated + STM to distal hamstring  Seated hamstring stretch 2 x 30 sec Standing hip flexor stretch 2 x 30sec Supine LTR 2 x 20 sec holds Supine Figure Four stretch Rt 2 x 30sec Supine single knee to chest with PT providing over pressure 2 x 20 sec each Education on muscle soreness vs nerve pain vs RA pain   08/19/23 Pt seen for aquatic therapy today.  Treatment took place in water 3.5-4.75 ft in depth at the [facility] pool. Temp of water was 86.  Pt entered/exited the pool via stairs independently with bilat rail.  Pt feels better facing lap pool due to leg length difference.   * without support: walking forward/ backwards with cues for vertical trunk, *  side stepping with arm addct/ abdct x 3 laps * marching forward/ backward * Ai chi postures:  (10 of each) contemplating, floating, uplifting, enclosing, folding, soothing, gathering, freeing * return to walking with arm swing and breast stroke arms      PATIENT EDUCATION:  Education details: aquatic therapy exercise progressions and modifications. Issued Ai Chi postures.  Person educated: Patient Education method: Explanation Education comprehension: verbalized understanding  HOME EXERCISE PROGRAM: Access Code: [email or web address] URL: [email or web address] Date: 09/09/2023 Prepared by: Claude Manges  Exercises - Supine Lower Trunk Rotation  - 1-2 x daily - 7 x weekly - 2 sets - 2 reps - 20-30 hold - Supine Single Knee to Chest Stretch  - 1-2 x daily - 7 x weekly - 2 sets - 20-30 hold - Seated Hamstring Stretch  - 1-2 x daily - 7 x weekly - 2 sets - 20-30 hold - Standing Hip Flexor Stretch  - 1-2 x daily - 7 x weekly - 2 sets - 15-20 hold - Supine Bridge  - 1 x daily - 7 x weekly - 2 sets - 10 reps - Hooklying Clamshell with Resistance  - 1 x daily - 7 x weekly - 2 sets - 10 reps - Small Range Straight Leg Raise  - 1 x daily - 7 x weekly - 2 sets - 10 reps ASSESSMENT:  CLINICAL IMPRESSION:  Today's treatment session focused on hip mobility and lower extremity strengthening. Annanicole presented to therapy today a lot less painful compared to previous treatment sessions. She tolerated progressions of LE strengthening exercises appropriately. Sidelying hip abduction was very challenging for patient. She required verbal and tactile cues for correct exercise performance. Updated patient's HEP to include some exercises from today's treatment session. Patient will benefit from skilled PT to address the below impairments and improve overall function.     From initial evaluation: Patient is a 57 y.o. female who was seen today for physical therapy evaluation and treatment for post lumbar  fusion and left THA.   She has underlying severe RA with multiple joint deformities and joint replacements.  She is fused at c spine as well.  She presents with decreased ROM and poor flexibility but generally good strength.  She would benefit from skilled PT in aquatics program initially to gain some control of her pain.  We will transition to land and pool after 4 weeks to see if she will be able to tolerate land based therapy with more manageable pain.    OBJECTIVE IMPAIRMENTS: decreased mobility, difficulty walking, decreased ROM, decreased strength, increased fascial restrictions, increased muscle spasms, impaired flexibility, impaired UE functional use, postural dysfunction, and pain.   ACTIVITY LIMITATIONS: carrying, lifting, bending, sitting, standing, squatting, sleeping, stairs, transfers, bed mobility, bathing, toileting, dressing, and hygiene/grooming  PARTICIPATION LIMITATIONS: meal prep, cleaning, laundry, driving, shopping, community activity, yard work, and church  PERSONAL FACTORS: Time since onset of injury/illness/exacerbation and 1-2 comorbidities: RA  are also affecting patient's functional outcome.   REHAB POTENTIAL: Fair Severity of RA and joint issues along with multiple sugeries may impede progress  CLINICAL DECISION MAKING: Evolving/moderate complexity  EVALUATION COMPLEXITY: Moderate   GOALS: Goals reviewed with patient? Yes  SHORT TERM GOALS: Target date: 08/25/2023   Pain report to be no greater than 6/10  Baseline: Goal status: In progress - 08/19/23  2.  Patient will be independent with initial HEP of aquatic exercises Baseline:  Goal status: In progress - 08/19/23  3.  Patient to be able to walk independently from parking lot of [facility] to pool independently without rest breaks using any assistive device  Baseline:  Goal status: In progress - 08/16/23    LONG TERM GOALS: Target date: 09/22/2023   Pain report to be no greater than 4/10   Baseline:  Goal status: INITIAL  2.  Patient to be independent with advanced HEP  Baseline:  Goal status: INITIAL  3.  Patient to be able to ascend and descend steps without pain or no greater than 2/10  Baseline:  Goal status: INITIAL  4.  Patient to be able to bend, stoop and squat with pain no greater than 2/10  Baseline:  Goal status: INITIAL  5.  Patient to be able to sleep through the night  Baseline:  Goal status: INITIAL  6.  Patient to report 85% improvement in overall symptoms Baseline:  Goal status: INITIAL  PLAN:  PT FREQUENCY: 1-2x/week  PT DURATION: 8 weeks  PLANNED INTERVENTIONS: 97146- PT Re-evaluation, 97110-Therapeutic exercises, 97530- Therapeutic activity, [id number]- Neuromuscular re-education, 97535- Self Care, [id number]- Manual therapy, [id number]- Gait training, [phone]- Canalith repositioning, [id number]- Aquatic Therapy, 97760- Splinting, 97014- Electrical stimulation (unattended), [id number]- Electrical stimulation (manual), [id number]- Vasopneumatic device, [id number]- Ultrasound, [id number]- Ionotophoresis 4mg /ml Dexamethasone, Patient/Family education, Balance training, Stair training, Taping, Dry Needling, Joint mobilization, Spinal mobilization, Scar mobilization, Vestibular training, Visual/preceptual remediation/compensation, DME instructions, Cryotherapy, Moist heat, Therapeutic exercises, Therapeutic activity, Neuromuscular re-education, Gait training, and Self Care.  PLAN FOR NEXT SESSION: 10th visit PN note; assess pain levels & tolerance to treatment session; progress hip strengthening as tolerated   Claude Manges, PT 09/09/23 10:15 AM [facility] [address] [place], [place] [address] Phone # [phone] Fax [phone]

## 2023-09-14 ENCOUNTER — Ambulatory Visit (HOSPITAL_BASED_OUTPATIENT_CLINIC_OR_DEPARTMENT_OTHER): Payer: Self-pay | Admitting: Physical Therapy

## 2023-09-17 NOTE — Progress Notes (Signed)
Office Visit Note  Patient: Caitlin Wagner             Date of Birth: 03-Sep-1966           MRN: [id number]             PCP: Genia Hotter, FNP Referring: Barbie Banner, MD Visit Date: 10/01/2023 Occupation: @GUAROCC @  Subjective:  Medication management  History of Present Illness: Caitlin Wagner is a 57 y.o. female seropositive rheumatoid arthritis.  She started on Rinvoq 15 mg p.o. daily on September 03, 2023.  She states she has noticed ongoing improvement with Rinvoq.  She notices joint pain with the weather change.  She states today her joints are feeling better.  She reports no discomfort in her replaced hips and knees.  The swelling on her hands and feet have improved.  She continues to have morning stiffness for couple of hours.  She states the muscle pain also improved while she was on prednisone.  She saw Dr. Marikay Alar, neurosurgeon.  He recommended repeat CT scan of her cervical spine and the lumbar spine.  She may require further surgery on her cervical spine.    Activities of Daily Living:  Patient reports morning stiffness for 1-2 hours.   Patient Reports nocturnal pain.  Difficulty dressing/grooming: Reports Difficulty climbing stairs: Reports Difficulty getting out of chair: Reports Difficulty using hands for taps, buttons, cutlery, and/or writing: Reports  Review of Systems  Constitutional:  Positive for fatigue.  HENT:  Negative for mouth sores and mouth dryness.   Eyes:  Negative for dryness.  Respiratory:  Negative for shortness of breath.   Cardiovascular:  Negative for chest pain and palpitations.  Gastrointestinal:  Negative for blood in stool, constipation and diarrhea.  Endocrine: Negative for increased urination.  Genitourinary:  Negative for involuntary urination.  Musculoskeletal:  Positive for joint pain, gait problem, joint pain, joint swelling, myalgias, muscle weakness, morning stiffness, muscle tenderness and myalgias.  Skin:  Negative for  color change, rash, hair loss and sensitivity to sunlight.  Allergic/Immunologic: Negative for susceptible to infections.  Neurological:  Negative for dizziness and headaches.  Hematological:  Negative for swollen glands.  Psychiatric/Behavioral:  Negative for depressed mood and sleep disturbance. The patient is not nervous/anxious.     PMFS History:  Patient Active Problem List   Diagnosis Date Noted   Cervical stenosis of spinal canal 10/01/2023   Lumbar stenosis with neurogenic claudication 10/01/2023   Failed total knee, left, initial encounter (HCC) 04/11/2018   Degenerative arthritis of right knee 05/10/2017   Degenerative joint disease of right knee 05/10/2017   Rheumatoid arthritis (HCC)     Past Medical History:  Diagnosis Date   Arthritis    RA   ASCUS of cervix with negative high risk HPV 10/2017   High risk HPV infection 03/2012   HSV (herpes simplex virus) anogenital infection 12/2016   LGSIL (low grade squamous intraepithelial dysplasia) 2013/2017   03/2012 positive high risk HPV,  09/2015 with negative high-risk HPV   Rheumatoid arthritis(714.0)    Spinal stenosis     Family History  Problem Relation Age of Onset   Heart disease Father        heart attack at age 88   Diverticulitis Father    Cancer Father        Bladder    Healthy Brother    Rheum arthritis Maternal Aunt    Heart failure Maternal Grandmother    Cancer Maternal Grandfather        ?  type   Diabetes Paternal Grandmother    Heart disease Paternal Grandfather    Healthy Son    Healthy Daughter    Autism Daughter    Anxiety disorder Daughter    ADD / ADHD Daughter    Past Surgical History:  Procedure Laterality Date   CERVICAL SPINE SURGERY     08/1992 x2 surgeries 1 week apart.   feet reconstruction surgery     bilateral    I & D KNEE WITH POLY EXCHANGE Left 04/11/2018   Procedure: REVISION LEFT TOTAL KNEE- POLY EXCHANGE;  Surgeon: Samson Frederic, MD;  Location: [facility] OR;  Service:  Orthopedics;  Laterality: Left;  Needs RNFA   LUMBAR FUSION  02/2023   TONSILLECTOMY  age 89   TOTAL HIP ARTHROPLASTY Right 12/2020   TOTAL HIP ARTHROPLASTY Left 12/2022   TOTAL KNEE ARTHROPLASTY Left 03/22/2017   Procedure: TOTAL KNEE ARTHROPLASTY w/ Navigation;  Surgeon: Samson Frederic, MD;  Location: [facility] OR;  Service: Orthopedics;  Laterality: Left;   TOTAL KNEE ARTHROPLASTY Right 05/10/2017   Procedure: TOTAL KNEE ARTHROPLASTY;  Surgeon: Samson Frederic, MD;  Location: [facility] OR;  Service: Orthopedics;  Laterality: Right;   TOTAL KNEE REVISION Left 07/2020   level 3   Social History   Social History Narrative   ** Merged History Encounter **       Immunization History  Administered Date(s) Administered   Influenza,inj,Quad PF,6+ Mos 08/01/2015   PFIZER(Purple Top)SARS-COV-2 Vaccination 01/10/2020, 01/27/2020, 06/06/2020     Objective: Vital Signs: BP 113/80 (BP Location: Left Arm, Patient Position: Sitting, Cuff Size: Normal)   Pulse 92   Ht 5\' 1"  (1.549 m)   Wt 177 lb 12.8 oz (80.6 kg)   LMP 04/06/2018   BMI 33.60 kg/m    Physical Exam Vitals and nursing note reviewed.  Constitutional:      Appearance: She is well-developed.  HENT:     Head: Normocephalic and atraumatic.  Eyes:     Conjunctiva/sclera: Conjunctivae normal.  Cardiovascular:     Rate and Rhythm: Normal rate and regular rhythm.     Heart sounds: Normal heart sounds.  Pulmonary:     Effort: Pulmonary effort is normal.     Breath sounds: Normal breath sounds.  Abdominal:     General: Bowel sounds are normal.     Palpations: Abdomen is soft.  Musculoskeletal:     Cervical back: Normal range of motion.  Lymphadenopathy:     Cervical: No cervical adenopathy.  Skin:    General: Skin is warm and dry.     Capillary Refill: Capillary refill takes less than 2 seconds.  Neurological:     Mental Status: She is alert and oriented to person, place, and time.  Psychiatric:        Behavior: Behavior normal.       Musculoskeletal Exam: Patient had limited lateral rotation of the cervical spine.  She had tenderness over thoracic and lumbar region with limited range of motion.  Shoulders were in good range of motion without discomfort.  She had bilateral elbow joint contracture with synovial thickening.  She had limited extension and flexion of her wrist joints with synovial thickening.  Flexion contractures were noted over bilateral MCP joints with ulnar deviation.  Synovial thickening was noted over MCPs and PIPs.  Hip joints and knee joints were replaced.  She has some swelling over her left ankle joint.  CDAI Exam: CDAI Score: 10  Patient Global: 40 / 100; Provider Global: 40 / 100  Swollen: 3 ; Tender: 0  Joint Exam 10/01/2023      Right  Left  Elbow  Swollen   Swollen   Ankle     Swollen      Investigation: No additional findings.  Imaging: MM 3D SCREENING MAMMOGRAM BILATERAL BREAST Result Date: 09/28/2023 CLINICAL DATA:  Screening. EXAM: DIGITAL SCREENING BILATERAL MAMMOGRAM WITH TOMOSYNTHESIS AND CAD TECHNIQUE: Bilateral screening digital craniocaudal and mediolateral oblique mammograms were obtained. Bilateral screening digital breast tomosynthesis was performed. The images were evaluated with computer-aided detection. COMPARISON:  None available. ACR Breast Density Category b: There are scattered areas of fibroglandular density. FINDINGS: There are no findings suspicious for malignancy. IMPRESSION: No mammographic evidence of malignancy. A result letter of this screening mammogram will be mailed directly to the patient. RECOMMENDATION: Screening mammogram in one year. (Code:SM-B-01Y) BI-RADS CATEGORY  1: Negative. Electronically Signed   By: Elberta Fortis M.D.   On: 09/28/2023 13:05   DG ELBOW COMPLETE LEFT (3+VIEW) Result Date: 09/25/2023 CLINICAL DATA:  Injury EXAM: LEFT ELBOW - COMPLETE 3+ VIEW COMPARISON:  None Available. FINDINGS: No evidence of acute fracture or dislocation. 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 There is a joint effusion with suggestion of small intra-articular loose bodies. No focal soft tissue swelling. IMPRESSION: 1. No acute fracture or dislocation of the left elbow. 2. 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 Electronically Signed   By: Duanne Guess D.O.   On: 09/25/2023 13:39   DG Thoracic Spine 2 View Result Date: 09/25/2023 CLINICAL DATA:  Back pain 09/06/2023 EXAM: THORACIC SPINE 2 VIEWS COMPARISON:  None Available. FINDINGS: There is no evidence of thoracic spine fracture. Prior anterior fusion at C7-T1. Slight levocurvature of the lower thoracic spine and partially imaged dextrocurvature of the lumbar spine. Straightening of the thoracic kyphosis without significant listhesis. Intervertebral disc heights are relatively well maintained. No other significant bone abnormalities are identified. IMPRESSION: 1. No acute findings. 2. Prior anterior fusion at C7-T1. 3. Slight levocurvature of the lower thoracic spine and partially imaged dextrocurvature of the lumbar spine. Electronically Signed   By: Duanne Guess D.O.   On: 09/25/2023 13:38   DG Chest 2 View Result Date: 09/07/2023 CLINICAL DATA:  Rheumatoid arthritis, immunosuppressive therapy EXAM: CHEST - 2 VIEW COMPARISON:  None Available. FINDINGS: Cardiac silhouette is unremarkable. No pneumothorax or pleural effusion. The lungs are clear. The visualized skeletal structures are unremarkable. IMPRESSION: No acute cardiopulmonary process. Electronically Signed   By: Layla Maw M.D.   On: 09/07/2023 21:30    Recent Labs: Lab Results  Component Value Date   WBC 9.2 08/30/2023   HGB 11.6 (L) 08/30/2023   PLT 382 08/30/2023   NA 141 08/30/2023   K 4.6 08/30/2023   CL 104 08/30/2023   CO2 24 08/30/2023   GLUCOSE 66 08/30/2023   BUN 12  08/30/2023   CREATININE 0.50 08/30/2023   BILITOT 0.2 08/30/2023   ALKPHOS 61 04/29/2017   AST 13 08/30/2023   ALT 10 08/30/2023   PROT 7.2 08/30/2023   PROT 7.2 08/30/2023   ALBUMIN 3.5 04/29/2017   CALCIUM 9.6 08/30/2023   GFRAA >60 04/12/2018   QFTBGOLDPLUS NEGATIVE 08/30/2023   August 30, 2023 SPEP normal, immunoglobulins normal, hepatitis B nonreactive, hepatitis C nonreactive, TB Gold negative, G6PD 23.5, ANA 1: 80 NH, RF 608, anti-CCP> 250, ESR 55, CK 41  Speciality Comments:  Methotrexate: Subcutaneous-inadequate response Leflunomide-diarrhea Enbrel, Humira, Orencia, Cimzia inadequate response, Remicade -anaphylaxis, Xeljanz-inadequate response Rinvoq-09/03/23  Procedures:  No procedures performed Allergies: Fentanyl, Remicade [infliximab], Arava [leflunomide], Cymbalta [duloxetine hcl], Dilaudid [hydromorphone hcl], and Codeine   Assessment / Plan:     Visit Diagnoses: Rheumatoid arthritis involving multiple sites with positive rheumatoid factor (HCC) - +RF, +anti-CCP, +ANA, elevated ESR, severe end-stage rheumatoid arthritis: She has severe end-stage rheumatoid arthritis involving multiple joints with contractures.  She has failed multiple medications in the past.  Prednisone taper was given at the last visit which she felt.  She was started on Rinvoq after informed consent was obtained.  She started Rinvoq on September 03, 2023.  She has been tolerating it without any side effects.  She has noticed remarkable improvement on Rinvoq over the last few weeks.  High risk medication use -Rinvoq 15 mg p.o. daily started September 03, 2023 previous tx: MTX, Arava, Enbrel, remicade, Humira, orencia, xeljanz, Cimzia, rinvoq.  I will obtain labs today.  She was advised to get labs every 3 months.  Information for immunization was placed in the AVS.  She was advised to hold Rinvoq if she develops an infection and resume after the infection resolves.  Blackbox warning regarding Mace  associated with Rinvoq and increased risk of cardiovascular events including MI, stroke, DVTs, pulmonary embolism and arterial thrombosis were discussed.  Increased risk of lymphoma was discussed.  Information was placed in the AVS.  Pain in both hands -she has severe end-stage rheumatoid arthritis.  She has contractures in her MCPs with ulnar deviation.  Synovitis is improved remarkably.  She had synovial thickening in multiple joints including wrist and MCPs and PIPs.  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  X-ray findings were reviewed.  Arthritis of carpometacarpal (CMC) joint of both thumbs-she has noticed improvement in her CMC joints.  H/O bilateral hip replacements - RTHR 2022, LTHR 12/2022 by Dr. Colin Mulders in [place].  Denies any discomfort.  Status post total knee replacement, bilateral - RTKR 2018, LTKR 2018, 2019,2021 Dr. Linna Caprice, surgery 2021 was by Dr. Colin Mulders in [place].  Denies discomfort.  Chronic pain of both ankles -she continues to have thickening of her ankles and some swelling in the left ankle joint.  X-rays obtained at the last visit were suggestive of rheumatoid arthritis.  X-ray findings were reviewed.  Pain in both feet -she has noticed improvement in the swelling of her right foot.  She had bilateral feet reconstruction surgery in 2009 at [facility].  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  X-ray findings were reviewed.  Cervical stenosis of spinal canal - S/P fusion and  laminectomy 11/23 in [place].  Patient was evaluated by Dr. Marikay Alar.  Patient states she will be getting CT scan and possible future repeat surgery on her cervical spine.  Lumbar stenosis with neurogenic claudication - S/p fusion  02/24/23 in [place].  She continues to have lower back pain.  Myalgia-she is to some myalgias improved on Rinvoq.  Vitamin D  deficiency-vitamin D was low at 17.  She was placed on vitamin D 50,000 units twice a week.  Will recheck vitamin D in 3 months.  She will have to stay on vitamin D 2000 units daily after finishing the course of vitamin D.  History of vertebral fracture - 1st thoracic vertebrae after the cervical fusion. 12/2022: s/p ACDF, now c/b hardware loosening and T1 vertebral  body fracture, anticipating revision.  Osteoporosis screening-patient recalls that her bone density was normal in the past.  Will schedule DEXA scan.  Postmenopausal-will get DEXA scan.  History of herpes genitalis  Former smoker - 1PPDX 10 years, quit 2015  Family history of rheumatoid arthritis-maternal great aunt  Orders: Orders Placed This Encounter  Procedures   DG Bone Density   CBC with Differential/Platelet   COMPLETE METABOLIC PANEL WITH GFR   No orders of the defined types were placed in this encounter.    Follow-Up Instructions: Return in about 3 months (around 12/30/2023) for Rheumatoid arthritis.   Pollyann Savoy, MD  Note - This record has been created using Animal nutritionist.  Chart creation errors have been sought, but may not always  have been located. Such creation errors do not reflect on  the standard of medical care.

## 2023-09-20 ENCOUNTER — Ambulatory Visit (HOSPITAL_BASED_OUTPATIENT_CLINIC_OR_DEPARTMENT_OTHER): Payer: Self-pay | Admitting: Physical Therapy

## 2023-09-22 ENCOUNTER — Ambulatory Visit: Payer: Commercial Managed Care - PPO | Attending: Family Medicine

## 2023-09-22 ENCOUNTER — Other Ambulatory Visit (HOSPITAL_BASED_OUTPATIENT_CLINIC_OR_DEPARTMENT_OTHER): Payer: Self-pay

## 2023-09-22 DIAGNOSIS — G8929 Other chronic pain: Secondary | ICD-10-CM

## 2023-09-22 DIAGNOSIS — M5459 Other low back pain: Secondary | ICD-10-CM

## 2023-09-22 DIAGNOSIS — M25562 Pain in left knee: Secondary | ICD-10-CM | POA: Diagnosis not present

## 2023-09-22 DIAGNOSIS — R262 Difficulty in walking, not elsewhere classified: Secondary | ICD-10-CM | POA: Diagnosis not present

## 2023-09-22 DIAGNOSIS — R252 Cramp and spasm: Secondary | ICD-10-CM

## 2023-09-22 DIAGNOSIS — R293 Abnormal posture: Secondary | ICD-10-CM

## 2023-09-22 DIAGNOSIS — M6281 Muscle weakness (generalized): Secondary | ICD-10-CM

## 2023-09-22 DIAGNOSIS — M79605 Pain in left leg: Secondary | ICD-10-CM

## 2023-09-22 DIAGNOSIS — M0579 Rheumatoid arthritis with rheumatoid factor of multiple sites without organ or systems involvement: Secondary | ICD-10-CM | POA: Diagnosis not present

## 2023-09-22 NOTE — Therapy (Signed)
OUTPATIENT PHYSICAL THERAPY THORACOLUMBAR TREATMENT Progress Note Reporting Period 07/28/23 to 09/22/23  See note below for Objective Data and Assessment of Progress/Goals.       Patient Name: Caitlin Wagner MRN: [id number] DOB:05-18-1966, 57 y.o., female Today's Date: 09/22/2023  END OF SESSION:  PT End of Session - 09/22/23 0935     Visit Number 10    Date for PT Re-Evaluation 09/22/23    Authorization Type [employer]    Progress Note Due on Visit 20    PT Start Time 0935    PT Stop Time 1015    PT Time Calculation (min) 40 min    Activity Tolerance Patient tolerated treatment well    Behavior During Therapy [facility] for tasks assessed/performed               Past Medical History:  Diagnosis Date   Arthritis    RA   ASCUS of cervix with negative high risk HPV 10/2017   High risk HPV infection 03/2012   HSV (herpes simplex virus) anogenital infection 12/2016   LGSIL (low grade squamous intraepithelial dysplasia) 2013/2017   03/2012 positive high risk HPV,  09/2015 with negative high-risk HPV   Rheumatoid arthritis(714.0)    Spinal stenosis    Past Surgical History:  Procedure Laterality Date   CERVICAL SPINE SURGERY     08/1992 x2 surgeries 1 week apart.   feet reconstruction surgery     bilateral    I & D KNEE WITH POLY EXCHANGE Left 04/11/2018   Procedure: REVISION LEFT TOTAL KNEE- POLY EXCHANGE;  Surgeon: Samson Frederic, MD;  Location: [facility] OR;  Service: Orthopedics;  Laterality: Left;  Needs RNFA   LUMBAR FUSION  02/2023   TONSILLECTOMY  age 44   TOTAL HIP ARTHROPLASTY Right 12/2020   TOTAL HIP ARTHROPLASTY Left 12/2022   TOTAL KNEE ARTHROPLASTY Left 03/22/2017   Procedure: TOTAL KNEE ARTHROPLASTY w/ Navigation;  Surgeon: Samson Frederic, MD;  Location: [facility] OR;  Service: Orthopedics;  Laterality: Left;   TOTAL KNEE ARTHROPLASTY Right 05/10/2017   Procedure: TOTAL KNEE ARTHROPLASTY;  Surgeon: Samson Frederic, MD;  Location: [facility] OR;  Service: Orthopedics;  Laterality:  Right;   TOTAL KNEE REVISION Left 07/2020   level 3   Patient Active Problem List   Diagnosis Date Noted   Failed total knee, left, initial encounter (HCC) 04/11/2018   Degenerative arthritis of right knee 05/10/2017   Degenerative joint disease of right knee 05/10/2017   Rheumatoid arthritis (HCC)     PCP: Barbie Banner, MD   REFERRING PROVIDER: Stamey, Verda Cumins, FNP  REFERRING DIAG:  Diagnosis  M06.9 (ICD-10-CM) - Rheumatoid arthritis involving multiple sites, unspecified whether rheumatoid factor present    Rationale for Evaluation and Treatment: Rehabilitation  THERAPY DIAG:  Cramp and spasm  Abnormal posture  Difficulty in walking, not elsewhere classified  Muscle weakness (generalized)  Other low back pain  Pain in left leg  Chronic pain of left knee  Rheumatoid arthritis involving multiple sites with positive rheumatoid factor (HCC)  ONSET DATE: 07/27/2023  SUBJECTIVE:  SUBJECTIVE STATEMENT: Patient reports she is continuing to feel improvement but can tell that she is tapering down from the prednisone.  She is feeling more soreness but pain still only 3/10.      From initial evaluation: Patient reports she has been diagnosed with RA for approx 20 years.  She has undergone multiple joint replacements and fusion surgeries.  Her two most recent surgeries include lumbar fusion and left hip replacement.  She was not able to do any PT after her lumbar fusion before she needed her hip replacement.  She feels she is losing the ability to be functional due to the severity of the pain and mobility issues.  She understands that she has developed tightness and weakness from not being able to do much in the way of activity for the past few months.  She hopes to be able to stay functional  and care for herself.    PERTINENT HISTORY:  RA x 20 years  PAIN: 09/09/2023 Are you having pain? Yes: NPRS scale: 6/10 Pain location:generalized  Pain description: nerve pain and RA Aggravating factors: cold fronts coming through, any activity during RA flare Relieving factors: meds, rest  PRECAUTIONS: Other: RA for 20 years, multiple deformities and surgeries, ulnar drift  RED FLAGS: None   WEIGHT BEARING RESTRICTIONS: No  FALLS:  Has patient fallen in last 6 months? No  LIVING ENVIRONMENT: Lives with: lives with their family Lives in: House/apartment Stairs: No Has following equipment at home: Single point cane  OCCUPATION: disabled  PLOF: Independent, Independent with basic ADLs, Independent with household mobility with device, Independent with community mobility with device, Independent with homemaking with ambulation, Independent with gait, Independent with transfers, and Needs assistance with homemaking  PATIENT GOALS: She hopes to be able to stay functional and care for herself.   NEXT MD VISIT: prn  OBJECTIVE:  Note: Objective measures were completed at Evaluation unless otherwise noted.  DIAGNOSTIC FINDINGS:  na    SCREENING FOR RED FLAGS: Bowel or bladder incontinence: No Spinal tumors: No Cauda equina syndrome: No Compression fracture: No Abdominal aneurysm: No  COGNITION: Overall cognitive status: Within functional limits for tasks assessed     SENSATION: WFL  MUSCLE LENGTH: Hamstrings: Right approx 45 degrees deg; Left approx 45 deg Thomas test: Right pos; Left pos  POSTURE: rounded shoulders, increased lumbar lordosis, right pelvic obliquity, and acquired scoliosis  PALPATION: na  LUMBAR ROM:  Initial eval:   Held on testing due to severe pain: will assess as pain level decreases  AROM eval  Flexion   Extension   Right lateral flexion   Left lateral flexion   Right rotation   Left rotation    (Blank rows = not tested)  LOWER  EXTREMITY ROM:     Numerous joint limitations and ROM deficits but she does have full overhead reach bilateral shoulders  LOWER EXTREMITY MMT:    Generally 4 to 4+ out of 5 in available range  LUMBAR SPECIAL TESTS:  Deferred due to recent surgery  FUNCTIONAL TESTS:  Initial eval: 5 times sit to stand: 31.12 sec Timed up and go (TUG): 23.62 sec  09/22/23: 5 times sit to stand: 13.37 sec Timed up and go (TUG): 13.55 sec  GAIT: Distance walked: 30 feet Assistive device utilized: Single point cane Level of assistance: SBA Comments: antalgic, abnormal  TODAY'S TREATMENT:    DATE:  09/22/2023 NuStep Level 3 x 5 minutes - PT present to discuss progress Seated hamstring stretch 2 x 30 sec  Supine LTR 6 x 20 sec holds Supine clamshell x 20 with yellow loop Supine hip abduction with towel under heel x 20 each LE Bridging 2 x 10 Supine PPT x 20 Supine PPT with march x 20 Supine TA contraction + Bent knee fallout x 10 each Supine SLR 2 x 10 each Sidelying hip abduction 2 x 10 each bilateral - still challenging but able to do a 2nd set today (patient intentional with keeping good form) Sidelying clamshells 2 x 10 each bilateral   09/09/2023 NuStep Level 3 x 5 minutes - PT present to discuss progress Seated hamstring stretch 2 x 30 sec Supine LTR 5 x 20 sec holds Supine clamshell x 20 with blue loop Bridging 2 x 10 Supine PPT x 20 Supine PPT with march x 20 Supine TA contraction + Bent knee fallout x 10 each Supine SLR x 10 each Sidelying hip abduction x 10 each bilateral - very challenging  Sidelying clamshells 2 x 10 each bilateral    09/02/2023 NuStep Level 3 x 5 minutes - PT present to discuss progress Attempted standing hamstring stretch but this was too taxing on the stance leg  Seated toe raises x 20  Seated heel raises x 20 Seated hamstring stretch 2 x 30 sec Seated left quad/hip flexor stretch 2 x 30 sec Supine LTR 5 x 20 sec holds Supine clamshell x 20 with  blue loop Bridging 2 x 10 Supine PPT x 20 Supine PPT with march x 20    08/23/2023 NuStep Level 1 5 minutes - PT present to discuss progress Manual: P/ROM Lt hamstring in seated + STM to distal hamstring  Seated hamstring stretch 2 x 30 sec Standing hip flexor stretch 2 x 30sec Supine LTR 2 x 20 sec holds Supine Figure Four stretch Rt 2 x 30sec Supine single knee to chest with PT providing over pressure 2 x 20 sec each Education on muscle soreness vs nerve pain vs RA pain   08/19/23 Pt seen for aquatic therapy today.  Treatment took place in water 3.5-4.75 ft in depth at the [facility] pool. Temp of water was 86.  Pt entered/exited the pool via stairs independently with bilat rail.  Pt feels better facing lap pool due to leg length difference.   * without support: walking forward/ backwards with cues for vertical trunk, * side stepping with arm addct/ abdct x 3 laps * marching forward/ backward * Ai chi postures:  (10 of each) contemplating, floating, uplifting, enclosing, folding, soothing, gathering, freeing * return to walking with arm swing and breast stroke arms      PATIENT EDUCATION:  Education details: aquatic therapy exercise progressions and modifications. Issued Ai Chi postures.  Person educated: Patient Education method: Explanation Education comprehension: verbalized understanding  HOME EXERCISE PROGRAM: Access Code: [email or web address] URL: [email or web address] Date: 09/09/2023 Prepared by: Claude Manges  Exercises - Supine Lower Trunk Rotation  - 1-2 x daily - 7 x weekly - 2 sets - 2 reps - 20-30 hold - Supine Single Knee to Chest Stretch  - 1-2 x daily - 7 x weekly - 2 sets - 20-30 hold - Seated Hamstring Stretch  - 1-2 x daily - 7 x weekly - 2 sets - 20-30 hold - Standing Hip Flexor Stretch  - 1-2 x daily - 7 x weekly - 2 sets - 15-20 hold - Supine Bridge  - 1 x daily - 7 x weekly - 2 sets - 10 reps - Hooklying Clamshell with Resistance  -  1  x daily - 7 x weekly - 2 sets - 10 reps - Small Range Straight Leg Raise  - 1 x daily - 7 x weekly - 2 sets - 10 reps ASSESSMENT:  CLINICAL IMPRESSION:  Caitlin Wagner has been able to progress with resistance exercises very well since starting new medication and a round of prednisone.  She was able to tolerate increased resistance on several hip exercises, along with increased reps on leg lifts and side lying hip abduction.  She is meeting short term goals and progressing toward long term goals.  Her functional tests were much improved.  She is very well motivated and compliant.  She should continue to do well.  Patient will benefit from skilled PT to address the below impairments and improve overall function.   From initial evaluation: Patient is a 57 y.o. female who was seen today for physical therapy evaluation and treatment for post lumbar fusion and left THA.   She has underlying severe RA with multiple joint deformities and joint replacements.  She is fused at c spine as well.  She presents with decreased ROM and poor flexibility but generally good strength.  She would benefit from skilled PT in aquatics program initially to gain some control of her pain.  We will transition to land and pool after 4 weeks to see if she will be able to tolerate land based therapy with more manageable pain.    OBJECTIVE IMPAIRMENTS: decreased mobility, difficulty walking, decreased ROM, decreased strength, increased fascial restrictions, increased muscle spasms, impaired flexibility, impaired UE functional use, postural dysfunction, and pain.   ACTIVITY LIMITATIONS: carrying, lifting, bending, sitting, standing, squatting, sleeping, stairs, transfers, bed mobility, bathing, toileting, dressing, and hygiene/grooming  PARTICIPATION LIMITATIONS: meal prep, cleaning, laundry, driving, shopping, community activity, yard work, and church  PERSONAL FACTORS: Time since onset of injury/illness/exacerbation and 1-2 comorbidities:  RA  are also affecting patient's functional outcome.   REHAB POTENTIAL: Fair Severity of RA and joint issues along with multiple sugeries may impede progress  CLINICAL DECISION MAKING: Evolving/moderate complexity  EVALUATION COMPLEXITY: Moderate   GOALS: Goals reviewed with patient? Yes  SHORT TERM GOALS: Target date: 08/25/2023   Pain report to be no greater than 6/10  Baseline: Goal status: MET 09/22/23  2.  Patient will be independent with initial HEP of aquatic exercises Baseline:  Goal status: MET 09/22/23  3.  Patient to be able to walk independently from parking lot [facility] to pool independently without rest breaks using any assistive device  Baseline:  Goal status: MET 09/22/23    LONG TERM GOALS: Target date: 09/22/2023   Pain report to be no greater than 4/10  Baseline:  Goal status: INITIAL  2.  Patient to be independent with advanced HEP  Baseline:  Goal status: INITIAL  3.  Patient to be able to ascend and descend steps without pain or no greater than 2/10  Baseline:  Goal status: INITIAL  4.  Patient to be able to bend, stoop and squat with pain no greater than 2/10  Baseline:  Goal status: INITIAL  5.  Patient to be able to sleep through the night  Baseline:  Goal status: INITIAL  6.  Patient to report 85% improvement in overall symptoms Baseline:  Goal status: INITIAL  PLAN:  PT FREQUENCY: 1-2x/week  PT DURATION: 8 weeks  PLANNED INTERVENTIONS: 97146- PT Re-evaluation, 97110-Therapeutic exercises, 97530- Therapeutic activity, [id number]- Neuromuscular re-education, 97535- Self Care, 16109- Manual therapy, [id number]- Gait training, [phone]- Canalith repositioning,  40981- Aquatic Therapy, [phone]- Splinting, 82956- Electrical stimulation (unattended), [id number]- Electrical stimulation (manual), [id number]- Vasopneumatic device, [id number]- Ultrasound, [id number]- Ionotophoresis 4mg /ml Dexamethasone, Patient/Family education, Balance training, Stair training,  Taping, Dry Needling, Joint mobilization, Spinal mobilization, Scar mobilization, Vestibular training, Visual/preceptual remediation/compensation, DME instructions, Cryotherapy, Moist heat, Therapeutic exercises, Therapeutic activity, Neuromuscular re-education, Gait training, and Self Care.  PLAN FOR NEXT SESSION: Progress core and hip strength, focus on glut med strength, possibly try band walks   Mulford B. Jaydence Arnesen, PT 09/22/23 2:02 PM [facility] [address] [place], [place] [address] Phone # [phone] Fax [phone]

## 2023-09-23 DIAGNOSIS — S13120A Subluxation of C1/C2 cervical vertebrae, initial encounter: Secondary | ICD-10-CM | POA: Diagnosis not present

## 2023-09-23 DIAGNOSIS — M069 Rheumatoid arthritis, unspecified: Secondary | ICD-10-CM | POA: Diagnosis not present

## 2023-09-23 DIAGNOSIS — G959 Disease of spinal cord, unspecified: Secondary | ICD-10-CM | POA: Diagnosis not present

## 2023-09-24 ENCOUNTER — Ambulatory Visit
Admission: RE | Admit: 2023-09-24 | Discharge: 2023-09-24 | Disposition: A | Discharge status: Home / Self Care | Payer: Commercial Managed Care - PPO | Source: Ambulatory Visit | Attending: Family Medicine | Admitting: Family Medicine

## 2023-09-24 DIAGNOSIS — Z1231 Encounter for screening mammogram for malignant neoplasm of breast: Secondary | ICD-10-CM

## 2023-09-26 NOTE — Progress Notes (Signed)
According to the radiology report there is previous fusion of C7 and T1.  Curvature of the lower thoracic and lumbar spine was noted.

## 2023-09-26 NOTE — Progress Notes (Signed)
According to radiology report elbow x-ray shows no fracture.  Severe arthritis of the elbow joint with small loose bodies inside the joint were noted.  Patient remains symptomatic then she should see an orthopedic surgeon.

## 2023-10-01 ENCOUNTER — Encounter: Payer: Self-pay | Admitting: Rheumatology

## 2023-10-01 ENCOUNTER — Ambulatory Visit: Payer: Commercial Managed Care - PPO | Attending: Rheumatology | Admitting: Rheumatology

## 2023-10-01 ENCOUNTER — Encounter: Payer: Medicare Other | Admitting: Rheumatology

## 2023-10-01 VITALS — BP 113/80 | HR 92 | Ht 61.0 in | Wt 177.8 lb

## 2023-10-01 DIAGNOSIS — Z79899 Other long term (current) drug therapy: Secondary | ICD-10-CM

## 2023-10-01 DIAGNOSIS — Z8619 Personal history of other infectious and parasitic diseases: Secondary | ICD-10-CM

## 2023-10-01 DIAGNOSIS — Z96643 Presence of artificial hip joint, bilateral: Secondary | ICD-10-CM

## 2023-10-01 DIAGNOSIS — M79671 Pain in right foot: Secondary | ICD-10-CM

## 2023-10-01 DIAGNOSIS — M180 Bilateral primary osteoarthritis of first carpometacarpal joints: Secondary | ICD-10-CM | POA: Diagnosis not present

## 2023-10-01 DIAGNOSIS — M4802 Spinal stenosis, cervical region: Secondary | ICD-10-CM | POA: Diagnosis not present

## 2023-10-01 DIAGNOSIS — M79641 Pain in right hand: Secondary | ICD-10-CM | POA: Diagnosis not present

## 2023-10-01 DIAGNOSIS — Z96653 Presence of artificial knee joint, bilateral: Secondary | ICD-10-CM | POA: Diagnosis not present

## 2023-10-01 DIAGNOSIS — Z87891 Personal history of nicotine dependence: Secondary | ICD-10-CM

## 2023-10-01 DIAGNOSIS — E559 Vitamin D deficiency, unspecified: Secondary | ICD-10-CM | POA: Diagnosis not present

## 2023-10-01 DIAGNOSIS — M0579 Rheumatoid arthritis with rheumatoid factor of multiple sites without organ or systems involvement: Secondary | ICD-10-CM

## 2023-10-01 DIAGNOSIS — Z780 Asymptomatic menopausal state: Secondary | ICD-10-CM

## 2023-10-01 DIAGNOSIS — Z13820 Encounter for screening for osteoporosis: Secondary | ICD-10-CM

## 2023-10-01 DIAGNOSIS — Z8261 Family history of arthritis: Secondary | ICD-10-CM

## 2023-10-01 DIAGNOSIS — M25572 Pain in left ankle and joints of left foot: Secondary | ICD-10-CM

## 2023-10-01 DIAGNOSIS — G8929 Other chronic pain: Secondary | ICD-10-CM

## 2023-10-01 DIAGNOSIS — M25571 Pain in right ankle and joints of right foot: Secondary | ICD-10-CM

## 2023-10-01 DIAGNOSIS — M48062 Spinal stenosis, lumbar region with neurogenic claudication: Secondary | ICD-10-CM | POA: Diagnosis not present

## 2023-10-01 DIAGNOSIS — M791 Myalgia, unspecified site: Secondary | ICD-10-CM | POA: Diagnosis not present

## 2023-10-01 DIAGNOSIS — M79642 Pain in left hand: Secondary | ICD-10-CM

## 2023-10-01 DIAGNOSIS — M79672 Pain in left foot: Secondary | ICD-10-CM

## 2023-10-01 DIAGNOSIS — Z8781 Personal history of (healed) traumatic fracture: Secondary | ICD-10-CM

## 2023-10-01 NOTE — Patient Instructions (Signed)
Standing Labs We placed an order today for your standing lab work.   Please have your standing labs drawn in March and every 3 months  Please have your labs drawn 2 weeks prior to your appointment so that the provider can discuss your lab results at your appointment, if possible.  Please note that you may see your imaging and lab results in MyChart before we have reviewed them. We will contact you once all results are reviewed. Please allow our office up to 72 hours to thoroughly review all of the results before contacting the office for clarification of your results.  WALK-IN LAB HOURS  Monday through Thursday from 8:00 am -12:30 pm and 1:00 pm-5:00 pm and Friday from 8:00 am-12:00 pm.  Patients with office visits requiring labs will be seen before walk-in labs.  You may encounter longer than normal wait times. Please allow additional time. Wait times may be shorter on  Monday and Thursday afternoons.  We do not book appointments for walk-in labs. We appreciate your patience and understanding with our staff.   Labs are drawn by [facility]. Please bring your co-pay at the time of your lab draw.  You may receive a bill from [facility] for your lab work.  Please note if you are on Hydroxychloroquine and and an order has been placed for a Hydroxychloroquine level,  you will need to have it drawn 4 hours or more after your last dose.  If you wish to have your labs drawn at another location, please call the office 24 hours in advance so we can fax the orders.  The office is located at [address], [place], [place] [address]   If you have any questions regarding directions or hours of operation,  please call [phone].   As a reminder, please drink plenty of water prior to coming for your lab work. Thanks!   Vaccines You are taking a medication(s) that can suppress your immune system.  The following immunizations are recommended: Flu annually Covid-19  RSV Td/Tdap (tetanus,  diphtheria, pertussis) every 10 years Pneumonia (Prevnar 15 then Pneumovax 23 at least 1 year apart.  Alternatively, can take Prevnar 20 without needing additional dose) Shingrix: 2 doses from 4 weeks to 6 months apart  Please check with your PCP to make sure you are up to date.   If you have signs or symptoms of an infection or start antibiotics: First, call your PCP for workup of your infection. Hold your medication through the infection, until you complete your antibiotics, and until symptoms resolve if you take the following: Injectable medication (Actemra, Benlysta, Cimzia, Cosentyx, Enbrel, Humira, Kevzara, Orencia, Remicade, Simponi, Stelara, Taltz, Tremfya) Methotrexate Leflunomide (Arava) Mycophenolate (Cellcept) Osborne [place], or Rinvoq   Because you are taking Harriette Ohara, Rinvoq, or Olumiant, it is very important to know that this class of medications has a FDA BLACK BOX WARNING for major adverse cardiovascular events (MACE), thrombosis, mortality (including sudden cardiovascular death), serious infections, and lymphomas. MACE is defined as cardiovascular death, myocardial infarction, and stroke. Thrombosis includes deep venous thrombosis (DVT), pulmonary embolism (PE), and arterial thrombosis. If you are a current or former smoker, you are at higher risk for MACE.

## 2023-10-02 LAB — COMPLETE METABOLIC PANEL WITH GFR
AG Ratio: 1.6 (calc) (ref 1.0–2.5)
ALT: 10 U/L (ref 6–29)
AST: 12 U/L (ref 10–35)
Albumin: 3.9 g/dL (ref 3.6–5.1)
Alkaline phosphatase (APISO): 65 U/L (ref 37–153)
BUN: 15 mg/dL (ref 7–25)
CO2: 27 mmol/L (ref 20–32)
Calcium: 9.6 mg/dL (ref 8.6–10.4)
Chloride: 106 mmol/L (ref 98–110)
Creat: 0.6 mg/dL (ref 0.50–1.03)
Globulin: 2.4 g/dL (ref 1.9–3.7)
Glucose, Bld: 74 mg/dL (ref 65–99)
Potassium: 4.6 mmol/L (ref 3.5–5.3)
Sodium: 141 mmol/L (ref 135–146)
Total Bilirubin: 0.2 mg/dL (ref 0.2–1.2)
Total Protein: 6.3 g/dL (ref 6.1–8.1)
eGFR: 106 mL/min/{1.73_m2} (ref >=60)

## 2023-10-02 LAB — CBC WITH DIFFERENTIAL/PLATELET
Absolute Lymphocytes: 2517 {cells}/uL (ref 850–3900)
Absolute Monocytes: 372 {cells}/uL (ref 200–950)
Basophils Absolute: 12 {cells}/uL (ref 0–200)
Basophils Relative: 0.2 %
Eosinophils Absolute: 31 {cells}/uL (ref 15–500)
Eosinophils Relative: 0.5 %
HCT: 40.1 % (ref 35.0–45.0)
Hemoglobin: 12.4 g/dL (ref 11.7–15.5)
MCH: 27.1 pg (ref 27.0–33.0)
MCHC: 30.9 g/dL — ABNORMAL LOW (ref 32.0–36.0)
MCV: 87.6 fL (ref 80.0–100.0)
MPV: 9.4 fL (ref 7.5–12.5)
Monocytes Relative: 6 %
Neutro Abs: 3267 {cells}/uL (ref 1500–7800)
Neutrophils Relative %: 52.7 %
Platelets: 330 10*3/uL (ref 140–400)
RBC: 4.58 10*6/uL (ref 3.80–5.10)
RDW: 16.8 % — ABNORMAL HIGH (ref 11.0–15.0)
Total Lymphocyte: 40.6 %
WBC: 6.2 10*3/uL (ref 3.8–10.8)

## 2023-10-03 NOTE — Progress Notes (Signed)
CBC and CMP are stable.

## 2023-10-04 ENCOUNTER — Other Ambulatory Visit (HOSPITAL_COMMUNITY): Payer: Self-pay | Admitting: Neurological Surgery

## 2023-10-04 DIAGNOSIS — G959 Disease of spinal cord, unspecified: Secondary | ICD-10-CM

## 2023-10-11 ENCOUNTER — Ambulatory Visit (HOSPITAL_COMMUNITY)
Admission: RE | Admit: 2023-10-11 | Discharge: 2023-10-11 | Disposition: A | Discharge status: Home / Self Care | Payer: Commercial Managed Care - PPO | Source: Ambulatory Visit | Attending: Neurological Surgery | Admitting: Neurological Surgery

## 2023-10-11 DIAGNOSIS — S12101D Unspecified nondisplaced fracture of second cervical vertebra, subsequent encounter for fracture with routine healing: Secondary | ICD-10-CM | POA: Diagnosis not present

## 2023-10-11 DIAGNOSIS — G959 Disease of spinal cord, unspecified: Secondary | ICD-10-CM | POA: Diagnosis not present

## 2023-10-11 DIAGNOSIS — S12000D Unspecified displaced fracture of first cervical vertebra, subsequent encounter for fracture with routine healing: Secondary | ICD-10-CM | POA: Diagnosis not present

## 2023-10-11 DIAGNOSIS — M9921 Subluxation stenosis of neural canal of cervical region: Secondary | ICD-10-CM | POA: Diagnosis not present

## 2023-10-11 DIAGNOSIS — M4803 Spinal stenosis, cervicothoracic region: Secondary | ICD-10-CM | POA: Diagnosis not present

## 2023-10-28 DIAGNOSIS — G959 Disease of spinal cord, unspecified: Secondary | ICD-10-CM | POA: Diagnosis not present

## 2023-10-29 DIAGNOSIS — J019 Acute sinusitis, unspecified: Secondary | ICD-10-CM | POA: Diagnosis not present

## 2023-11-02 ENCOUNTER — Ambulatory Visit: Payer: Medicare Other | Admitting: Rheumatology

## 2023-11-11 DIAGNOSIS — S13120A Subluxation of C1/C2 cervical vertebrae, initial encounter: Secondary | ICD-10-CM | POA: Diagnosis not present

## 2023-11-11 DIAGNOSIS — G959 Disease of spinal cord, unspecified: Secondary | ICD-10-CM | POA: Diagnosis not present

## 2023-11-22 ENCOUNTER — Other Ambulatory Visit: Payer: Self-pay

## 2023-11-22 ENCOUNTER — Other Ambulatory Visit: Payer: Self-pay | Admitting: Rheumatology

## 2023-11-22 DIAGNOSIS — M059 Rheumatoid arthritis with rheumatoid factor, unspecified: Secondary | ICD-10-CM

## 2023-11-22 DIAGNOSIS — Z79899 Other long term (current) drug therapy: Secondary | ICD-10-CM

## 2023-11-22 NOTE — Telephone Encounter (Signed)
Last Fill: 09/03/2023  Labs: 10/01/2023 CBC and CMP are stable.   TB Gold: 08/30/2023 Neg    Next Visit: 12/30/2023  Last Visit: 10/01/2023  [id number] arthritis involving multiple sites with positive rheumatoid factor   Current Dose per office note 10/01/2023: Rinvoq 15 mg p.o. daily   Okay to refill Rinvoq?

## 2023-11-25 DIAGNOSIS — S32010S Wedge compression fracture of first lumbar vertebra, sequela: Secondary | ICD-10-CM | POA: Diagnosis not present

## 2023-11-25 DIAGNOSIS — M542 Cervicalgia: Secondary | ICD-10-CM | POA: Diagnosis not present

## 2023-11-25 DIAGNOSIS — Z981 Arthrodesis status: Secondary | ICD-10-CM | POA: Diagnosis not present

## 2023-11-25 DIAGNOSIS — S32018D Other fracture of first lumbar vertebra, subsequent encounter for fracture with routine healing: Secondary | ICD-10-CM | POA: Diagnosis not present

## 2023-11-25 DIAGNOSIS — S22080S Wedge compression fracture of T11-T12 vertebra, sequela: Secondary | ICD-10-CM | POA: Diagnosis not present

## 2023-12-01 DIAGNOSIS — Z780 Asymptomatic menopausal state: Secondary | ICD-10-CM | POA: Diagnosis not present

## 2023-12-03 ENCOUNTER — Ambulatory Visit: Payer: Commercial Managed Care - PPO | Admitting: Neurology

## 2023-12-03 ENCOUNTER — Encounter: Payer: Self-pay | Admitting: Neurology

## 2023-12-03 VITALS — BP 116/81 | HR 77 | Ht 61.0 in | Wt 183.0 lb

## 2023-12-03 DIAGNOSIS — M48061 Spinal stenosis, lumbar region without neurogenic claudication: Secondary | ICD-10-CM

## 2023-12-03 DIAGNOSIS — R200 Anesthesia of skin: Secondary | ICD-10-CM

## 2023-12-03 DIAGNOSIS — R269 Unspecified abnormalities of gait and mobility: Secondary | ICD-10-CM | POA: Diagnosis not present

## 2023-12-03 DIAGNOSIS — M5412 Radiculopathy, cervical region: Secondary | ICD-10-CM | POA: Diagnosis not present

## 2023-12-03 DIAGNOSIS — M069 Rheumatoid arthritis, unspecified: Secondary | ICD-10-CM | POA: Diagnosis not present

## 2023-12-03 DIAGNOSIS — M4802 Spinal stenosis, cervical region: Secondary | ICD-10-CM | POA: Diagnosis not present

## 2023-12-03 NOTE — Progress Notes (Signed)
[facility]  PATIENT: Caitlin Wagner DOB: April 07, 1966  REQUESTING CLINICIAN: Pollyann Savoy, MD HISTORY FROM: Patient/Chart review  REASON FOR VISIT: Back pain/Neck pain/numbness right side body   HISTORICAL  CHIEF COMPLAINT:  Chief Complaint  Patient presents with   New Patient (Initial Visit)    Pt alone, rm 13. She recently saw Dr Yetta Barre in [facility]. He evaluated and referred [facility] due to stating more complicated finding. She has been having these symptoms over 12yrs ago. Numbness and nerve pain has never went away from previous surgery that was completed. They recommended she evaluate by both neurology and neurosurgery. She has images that were [facility].     HISTORY OF PRESENT ILLNESS:  This is a 58 year old woman past medical history of severe rheumatoid arthritis, complicated with multiple surgeries including hip replacement, knee replacement who is presenting with complaint of numbness and burning sensation in the right side of her body starting at the mid back going down to the right leg.   In brief, patient tells me that back in 2023 while in [place] she was complaining of numbness and pain in bilateral lower extremities.  At that time she did have multiple lumbar spine MRI without clear etiology.  She even had urinary incontinence.  She reported in November 2023, she fell and after the fall had a cervical spine MRI which showed severe spinal stenosis, she underwent ACDF in November, and later had to go back for laminectomy because she was having symptoms.  She tells me in May 2024 she did have lumbar spinal fusion.  She recently moved to [facility] and needed to reestablish care with neurosurgery and neurology.  She did follow-up with Dr. Yetta Barre at [facility], and was referred to [facility].  Patient was also referred to [place] to establish care with neurology.  She tells me currently her main  symptoms are right-sided numbness and burning pain that start at the level of the mid back and radiates down to the right side of her body including the right leg.  She is on gabapentin, 400/400/800 mg daily and state the pain is manageable.  At 1 point she was on duloxetine but did have side effect from the medication therefore discontinued.   OTHER MEDICAL CONDITIONS: Severe Rheumatoid Arthritis, prior cervical and lumbar surgery   REVIEW OF SYSTEMS: Full 14 system review of systems performed and negative with exception of: As noted in the HPI   ALLERGIES: Allergies  Allergen Reactions   Fentanyl Other (See Comments)    Per patient, she has "forgotten to breathe" can only use in the OR per patient with close monitoring       Remicade [Infliximab] Anaphylaxis   Arava [Leflunomide]     Severe diarrhea    Cymbalta [Duloxetine Hcl]     Gums opened at base of teeth per patient    Dilaudid [Hydromorphone Hcl] Nausea And Vomiting   Codeine Other (See Comments)    Extreme feeling of something crawling    HOME MEDICATIONS: Outpatient Medications Prior to Visit  Medication Sig Dispense Refill   acetaminophen (TYLENOL) 500 MG tablet Take 1,000 mg by mouth 3 (three) times daily.     gabapentin (NEURONTIN) 400 MG capsule Take 400-800 mg by mouth 3 (three) times daily. 400 mg twice during day and 800 mg at bedtime     meloxicam (MOBIC) 7.5 MG tablet Take 7.5 mg by mouth 2 (two) times daily.  Psyllium (METAMUCIL PO) Take by mouth daily.     RINVOQ 15 MG TB24 TAKE 1 TABLET DAILY 90 tablet 0   valACYclovir (VALTREX) 1000 MG tablet Take 1,000 mg by mouth daily.     Vitamin D, Ergocalciferol, (DRISDOL) 1.25 MG (50000 UNIT) CAPS capsule Take 1 capsule (50,000 Units total) by mouth 2 (two) times a week. 24 capsule 0   No facility-administered medications prior to visit.    PAST MEDICAL HISTORY: Past Medical History:  Diagnosis Date   Arthritis    RA   ASCUS of cervix with negative high  risk HPV 10/2017   High risk HPV infection 03/2012   HSV (herpes simplex virus) anogenital infection 12/2016   LGSIL (low grade squamous intraepithelial dysplasia) 2013/2017   03/2012 positive high risk HPV,  09/2015 with negative high-risk HPV   Rheumatoid arthritis(714.0)    Spinal stenosis     PAST SURGICAL HISTORY: Past Surgical History:  Procedure Laterality Date   CERVICAL SPINE SURGERY     08/1992 x2 surgeries 1 week apart.   feet reconstruction surgery     bilateral    I & D KNEE WITH POLY EXCHANGE Left 04/11/2018   Procedure: REVISION LEFT TOTAL KNEE- POLY EXCHANGE;  Surgeon: Samson Frederic, MD;  Location: [facility] OR;  Service: Orthopedics;  Laterality: Left;  Needs RNFA   LUMBAR FUSION  02/2023   TONSILLECTOMY  age 89   TOTAL HIP ARTHROPLASTY Right 12/2020   TOTAL HIP ARTHROPLASTY Left 12/2022   TOTAL KNEE ARTHROPLASTY Left 03/22/2017   Procedure: TOTAL KNEE ARTHROPLASTY w/ Navigation;  Surgeon: Samson Frederic, MD;  Location: [facility] OR;  Service: Orthopedics;  Laterality: Left;   TOTAL KNEE ARTHROPLASTY Right 05/10/2017   Procedure: TOTAL KNEE ARTHROPLASTY;  Surgeon: Samson Frederic, MD;  Location: [facility] OR;  Service: Orthopedics;  Laterality: Right;   TOTAL KNEE REVISION Left 07/2020   level 3    FAMILY HISTORY: Family History  Problem Relation Age of Onset   Heart disease Father        heart attack at age 48   Diverticulitis Father    Cancer Father        Bladder    Healthy Brother    Rheum arthritis Maternal Aunt    Heart failure Maternal Grandmother    Cancer Maternal Grandfather        ? type   Diabetes Paternal Grandmother    Heart disease Paternal Grandfather    Healthy Son    Healthy Daughter    Autism Daughter    Anxiety disorder Daughter    ADD / ADHD Daughter     SOCIAL HISTORY: Social History   Socioeconomic History   Marital status: Divorced    Spouse name: Not on file   Number of children: Not on file   Years of education: Not on file   Highest  education level: Not on file  Occupational History   Not on file  Tobacco Use   Smoking status: Former    Current packs/day: 0.00    Types: Cigarettes    Quit date: 03/12/2014    Years since quitting: 9.7    Passive exposure: Past   Smokeless tobacco: Never  Vaping Use   Vaping status: Former  Substance and Sexual Activity   Alcohol use: No    Alcohol/week: 0.0 standard drinks of alcohol   Drug use: No   Sexual activity: Yes    Birth control/protection: Other-see comments    Comment: Vasectomy-1st intercourse 58 yo-More than 5  partners  Other Topics Concern   Not on file  Social History Narrative   ** Merged History Encounter **       Social Drivers of Health   Financial Resource Strain: Low Risk  (05/08/2022)   Received from [facility]   Overall Financial Resource Strain (CARDIA)    Difficulty of Paying Living Expenses: Not hard at all  Food Insecurity: Unknown (03/16/2023)   Received from [facility] [place] [place] [facility] - [facility]   Hunger Vital Sign    Worried About Running Out of Food in the Last Year: Never true    Ran Out of Food in the Last Year: Not on file  Transportation Needs: No Transportation Needs (04/14/2023)   Received from [facility]   OASIS [phone]: Transportation    Lack of Transportation (Medical): No    Lack of Transportation (Non-Medical): No    Patient Unable or Declines to Respond: No  Physical Activity: Unknown (05/12/2023)   Received from [facility]   Exercise Vital Sign    Days of Exercise per Week: 0 days    Minutes of Exercise per Session: Not on file  Stress: No Stress Concern Present (05/08/2022)   Received from [facility] - Occupational Stress Questionnaire    Feeling of Stress : Not at all  Social Connections: Feeling Socially Integrated (04/14/2023)   Received from [facility]   OASIS [phone]: Social Isolation     Frequency of experiencing loneliness or isolation: Never  Intimate Partner Violence: Unknown (03/16/2023)   Received from [facility] - [place], [place] [facility] [place]   Humiliation, Afraid, Rape, and Kick questionnaire    Fear of Current or Ex-Partner: Patient unable to answer    Emotionally Abused: Not on file    Physically Abused: Not on file    Sexually Abused: Not on file    PHYSICAL EXAM  GENERAL EXAM/CONSTITUTIONAL: Vitals:  Vitals:   12/03/23 1052  BP: 116/81  Pulse: 77  Weight: 183 lb (83 kg)  Height: 5\' 1"  (1.549 m)   Body mass index is 34.58 kg/m. Wt Readings from Last 3 Encounters:  12/03/23 183 lb (83 kg)  10/01/23 177 lb 12.8 oz (80.6 kg)  08/30/23 173 lb 6.4 oz (78.7 kg)   Patient is in no distress; well developed, nourished and groomed; neck is supple  MUSCULOSKELETAL: Gait, strength, tone, movements noted in Neurologic exam below  NEUROLOGIC: MENTAL STATUS:      No data to display         awake, alert, oriented to person, place and time recent and remote memory intact normal attention and concentration language fluent, comprehension intact, naming intact fund of knowledge appropriate  CRANIAL NERVE:  2nd, 3rd, 4th, 6th - Visual fields full to confrontation, extraocular muscles intact, no nystagmus 5th - facial sensation symmetric 7th - facial strength symmetric 8th - hearing intact 9th - palate elevates symmetrically, uvula midline 11th - shoulder shrug symmetric 12th - tongue protrusion midline  MOTOR:  normal bulk and tone, full strength in the BUE, BLE but limited by pain  SENSORY:  normal and symmetric to light touch  COORDINATION:  finger-nose-finger, fine finger movements normal  REFLEXES:  deep tendon reflexes present and symmetric  GAIT/STATION:  Antalgic, uses a cane.    DIAGNOSTIC DATA (LABS, IMAGING, TESTING) - I reviewed patient records, labs, notes, testing and imaging myself where available.  Lab Results   Component Value Date  WBC 6.2 10/01/2023   HGB 12.4 10/01/2023   HCT 40.1 10/01/2023   MCV 87.6 10/01/2023   PLT 330 10/01/2023      Component Value Date/Time   NA 141 10/01/2023 1006   K 4.6 10/01/2023 1006   CL 106 10/01/2023 1006   CO2 27 10/01/2023 1006   GLUCOSE 74 10/01/2023 1006   BUN 15 10/01/2023 1006   CREATININE 0.60 10/01/2023 1006   CALCIUM 9.6 10/01/2023 1006   PROT 6.3 10/01/2023 1006   ALBUMIN 3.5 04/29/2017 1038   AST 12 10/01/2023 1006   ALT 10 10/01/2023 1006   ALKPHOS 61 04/29/2017 1038   BILITOT 0.2 10/01/2023 1006   GFRNONAA >60 04/12/2018 0642   GFRAA >60 04/12/2018 0642   Lab Results  Component Value Date   CHOL 135 08/30/2023   HDL 43 (L) 08/30/2023   LDLCALC 69 08/30/2023   TRIG 156 (H) 08/30/2023   CHOLHDL 3.1 08/30/2023   No results found for: "HGBA1C" No results found for: "VITAMINB12" Lab Results  Component Value Date   TSH 1.026 10/09/2015    CT Cervical spine 10/11/2023 1. Prior C7-T1 ACDF with progressive back out of the right-sided screw. Interval healing of the T1 vertebral body fracture. 2. Unchanged anterior subluxation of C1 on C2 with moderate spinal stenosis. 3. Likely moderate spinal stenosis and moderate neural foraminal stenosis at C3-4. 4. Likely moderate spinal stenosis and moderate to severe right neural foraminal stenosis at C4-5. 5. Moderate right and mild left neural foraminal stenosis at C7-T1  MRI Cervical spine 10/28/2023 1. C7-T1 ACDF with left hemicord myelomalacia, patent spinal canal, and moderate right and mild left neural foraminal stenosis. 2. Moderate to severe spinal and right neural foraminal stenosis at C4-5. 3. Moderate spinal stenosis and moderate to severe bilateral neural foraminal stenosis at C3-4. 4. Chronically severe C1-2 arthropathy with moderate spinal stenosis.    ASSESSMENT AND PLAN  58 y.o. year old female with history of severe rheumatoid arthritis status post multiple surgeries,  cervical and lumbar spine surgeries who is presenting with numbness right side of the body and burning sensation.  On exam she does have normal sensation, normal reflexes but she is limited by pain.  Informed patient that I do believe her symptoms are coming from her cervical spine.  Her repeat MRI cervical spine showed moderate to severe spinal canal stenosis and right foraminal stenosis at C3-C4 and C4-C5 likely causing her symptoms.  Advised her to continue following up with [facility].  For better continuation of care she should also follow-up with the neurology team.  In the meantime if her pain get worse, we can switch her gabapentin to pregabalin and add nortriptyline.  She was understanding.  Continue follow-up with your doctors return as needed.   1. Cervical radiculopathy   2. Numbness   3. Cervical stenosis of spinal canal   4. Spinal stenosis of lumbar region, unspecified whether neurogenic claudication present   5. Abnormal gait   6. Rheumatoid arthritis involving multiple sites, unspecified whether rheumatoid factor present [facility])      Patient Instructions  Continue current medication including Gabapentin  Continue to follow up with [facility], consider seeing their neurology team for better continuation of care  Please contact us if neuropathic pain (burning pain) is not improving, we can switch Gabapentin to Pregabalin, can also add Nortriptyline since you have an allergy to Duloxetine.  Return as needed  No orders of the defined types were placed  in this encounter.   No orders of the defined types were placed in this encounter.   Return if symptoms worsen or fail to improve.    Windell Norfolk, MD 12/03/2023, 4:23 PM  [facility] [address], [address] [place], [place] [address] [phone]

## 2023-12-03 NOTE — Patient Instructions (Addendum)
Continue current medication including Gabapentin  Continue to follow up with [facility], consider seeing their neurology team for better continuation of care  Please contact us if neuropathic pain (burning pain) is not improving, we can switch Gabapentin to Pregabalin, can also add Nortriptyline since you have an allergy to Duloxetine.  Return as needed

## 2023-12-14 DIAGNOSIS — M542 Cervicalgia: Secondary | ICD-10-CM | POA: Diagnosis not present

## 2023-12-14 DIAGNOSIS — M549 Dorsalgia, unspecified: Secondary | ICD-10-CM | POA: Diagnosis not present

## 2023-12-14 DIAGNOSIS — M545 Low back pain, unspecified: Secondary | ICD-10-CM | POA: Diagnosis not present

## 2023-12-16 NOTE — Progress Notes (Unsigned)
 Office Visit Note  Patient: Caitlin Wagner             Date of Birth: October 11, 1966           MRN: [id number]             PCP: Genia Hotter, FNP Referring: Genia Hotter, FNP Visit Date: 12/30/2023 Occupation: @GUAROCC @  Subjective:  Medication monitoring   History of Present Illness: Caitlin Wagner is a 58 y.o. female with history of seropositive rheumatoid arthritis.  Patient remains on Rinvoq 15 mg p.o. daily started September 03, 2023.  She is tolerating Rinvoq without any side effects and has not had any recent gaps in therapy.  Patient reports that she has had a 98% improvement in her symptoms since initiating Rinvoq.  She continues to have chronic pain and stiffness in her cervical spine and lower back.  She has been using a cane to assist with ambulation.  She has recently tried dry needling.  According to the patient she will require cervical spine surgical intervention and will be following up with her neurosurgeon Dr. Neal Dy on Tuesday to plan a surgical date.  Patient is aware that she will need to hold rinvoq prior to surgical intervention and will require clearance by Dr. Neal Dy prior to resuming Rinvoq. She denies any recent or recurrent infections.  Activities of Daily Living:  Patient reports morning stiffness for a few minutes.   Patient Reports nocturnal pain.  Difficulty dressing/grooming: Reports Difficulty climbing stairs: Reports Difficulty getting out of chair: Reports Difficulty using hands for taps, buttons, cutlery, and/or writing: Reports  Review of Systems  Constitutional:  Positive for fatigue.  HENT:  Negative for mouth sores and mouth dryness.   Eyes:  Positive for dryness. Negative for pain.  Respiratory:  Negative for shortness of breath and difficulty breathing.   Cardiovascular:  Negative for chest pain and palpitations.  Gastrointestinal:  Positive for constipation. Negative for blood in stool and diarrhea.  Endocrine: Negative for increased  urination.  Genitourinary:  Positive for involuntary urination.  Musculoskeletal:  Positive for joint pain, gait problem, joint pain, myalgias, morning stiffness, muscle tenderness and myalgias. Negative for joint swelling and muscle weakness.  Skin:  Negative for color change, rash, hair loss and sensitivity to sunlight.  Allergic/Immunologic: Negative for susceptible to infections.  Neurological:  Positive for dizziness, numbness and headaches.  Hematological:  Negative for swollen glands.  Psychiatric/Behavioral:  Positive for sleep disturbance. Negative for depressed mood. The patient is not nervous/anxious.     PMFS History:  Patient Active Problem List   Diagnosis Date Noted   Cervical stenosis of spinal canal 10/01/2023   Lumbar stenosis with neurogenic claudication 10/01/2023   Failed total knee, left, initial encounter (HCC) 04/11/2018   Degenerative arthritis of right knee 05/10/2017   Degenerative joint disease of right knee 05/10/2017   Rheumatoid arthritis (HCC)     Past Medical History:  Diagnosis Date   Arthritis    RA   ASCUS of cervix with negative high risk HPV 10/2017   High risk HPV infection 03/2012   HSV (herpes simplex virus) anogenital infection 12/2016   LGSIL (low grade squamous intraepithelial dysplasia) 2013/2017   03/2012 positive high risk HPV,  09/2015 with negative high-risk HPV   Rheumatoid arthritis(714.0)    Spinal stenosis     Family History  Problem Relation Age of Onset   Heart disease Father        heart attack at age 68  Diverticulitis Father    Cancer Father        Bladder    Healthy Brother    Rheum arthritis Maternal Aunt    Heart failure Maternal Grandmother    Cancer Maternal Grandfather        ? type   Diabetes Paternal Grandmother    Heart disease Paternal Grandfather    Healthy Son    Healthy Daughter    Autism Daughter    Anxiety disorder Daughter    ADD / ADHD Daughter    Past Surgical History:  Procedure  Laterality Date   CERVICAL SPINE SURGERY     08/1992 x2 surgeries 1 week apart.   feet reconstruction surgery     bilateral    I & D KNEE WITH POLY EXCHANGE Left 04/11/2018   Procedure: REVISION LEFT TOTAL KNEE- POLY EXCHANGE;  Surgeon: Samson Frederic, MD;  Location: [facility] OR;  Service: Orthopedics;  Laterality: Left;  Needs RNFA   LUMBAR FUSION  02/2023   TONSILLECTOMY  age 27   TOTAL HIP ARTHROPLASTY Right 12/2020   TOTAL HIP ARTHROPLASTY Left 12/2022   TOTAL KNEE ARTHROPLASTY Left 03/22/2017   Procedure: TOTAL KNEE ARTHROPLASTY w/ Navigation;  Surgeon: Samson Frederic, MD;  Location: [facility] OR;  Service: Orthopedics;  Laterality: Left;   TOTAL KNEE ARTHROPLASTY Right 05/10/2017   Procedure: TOTAL KNEE ARTHROPLASTY;  Surgeon: Samson Frederic, MD;  Location: [facility] OR;  Service: Orthopedics;  Laterality: Right;   TOTAL KNEE REVISION Left 07/2020   level 3   Social History   Social History Narrative   ** Merged History Encounter **       Immunization History  Administered Date(s) Administered   Influenza,inj,Quad PF,6+ Mos 08/01/2015   PFIZER(Purple Top)SARS-COV-2 Vaccination 01/10/2020, 01/27/2020, 06/06/2020     Objective: Vital Signs: BP 116/84 (BP Location: Left Wrist, Patient Position: Sitting, Cuff Size: Normal)   Pulse 88   Resp 15   Ht 5\' 1"  (1.549 m)   Wt 180 lb 12.8 oz (82 kg)   LMP 04/06/2018   BMI 34.16 kg/m    Physical Exam Vitals and nursing note reviewed.  Constitutional:      Appearance: She is well-developed.  HENT:     Head: Normocephalic and atraumatic.  Eyes:     Conjunctiva/sclera: Conjunctivae normal.  Cardiovascular:     Rate and Rhythm: Normal rate and regular rhythm.     Heart sounds: Normal heart sounds.  Pulmonary:     Effort: Pulmonary effort is normal.     Breath sounds: Normal breath sounds.  Abdominal:     General: Bowel sounds are normal.     Palpations: Abdomen is soft.  Musculoskeletal:     Cervical back: Normal range of motion.   Lymphadenopathy:     Cervical: No cervical adenopathy.  Skin:    General: Skin is warm and dry.     Capillary Refill: Capillary refill takes less than 2 seconds.  Neurological:     Mental Status: She is alert and oriented to person, place, and time.  Psychiatric:        Behavior: Behavior normal.      Musculoskeletal Exam: Patient remained seated during examination today.  C-spine has limited range of motion with lateral rotation.  Shoulder joints have good range of motion.  Bilateral flexion contractures of both elbows with synovial thickening.  Tenderness of the right elbow noted.  Limited flexion extension of both wrist joints with synovial thickening.  Flexion contractures of bilateral MCP joints with ulnar  deviation bilaterally.  Synovial thickening of all MCP joints and PIP joints.  No active synovitis noted.  Bilateral knee replacements have good range of motion with no warmth or effusion.  CDAI Exam: CDAI Score: -- Patient Global: --; Provider Global: -- Swollen: --; Tender: -- Joint Exam 12/30/2023   No joint exam has been documented for this visit   There is currently no information documented on the homunculus. Go to the Rheumatology activity and complete the homunculus joint exam.  Investigation: No additional findings.  Imaging: No results found.  Recent Labs: Lab Results  Component Value Date   WBC 6.2 10/01/2023   HGB 12.4 10/01/2023   PLT 330 10/01/2023   NA 141 10/01/2023   K 4.6 10/01/2023   CL 106 10/01/2023   CO2 27 10/01/2023   GLUCOSE 74 10/01/2023   BUN 15 10/01/2023   CREATININE 0.60 10/01/2023   BILITOT 0.2 10/01/2023   ALKPHOS 61 04/29/2017   AST 12 10/01/2023   ALT 10 10/01/2023   PROT 6.3 10/01/2023   ALBUMIN 3.5 04/29/2017   CALCIUM 9.6 10/01/2023   GFRAA >60 04/12/2018   QFTBGOLDPLUS NEGATIVE 08/30/2023    Speciality Comments: Methotrexate: Subcutaneous-inadequate response Leflunomide-diarrhea Enbrel, Humira, Orencia, Cimzia  inadequate response, Remicade -anaphylaxis, Xeljanz-inadequate response Rinvoq-09/03/23  Procedures:  No procedures performed Allergies: Fentanyl, Remicade [infliximab], Arava [leflunomide], Cymbalta [duloxetine hcl], Dilaudid [hydromorphone hcl], and Codeine    Assessment / Plan:     Visit Diagnoses: Rheumatoid arthritis involving multiple sites with positive rheumatoid factor (HCC) - +RF, +anti-CCP, +ANA, elevated ESR, severe end-stage rheumatoid arthritis: She has no synovitis on examination today.  She has not had any recent rheumatoid arthritis flares.  She is some tenderness along the right elbow joint line but overall has noticed a 95% improvement in her symptoms while taking Rinvoq.  She is taking Rinvoq 15 mg 1 tablet by mouth daily.  She is tolerating Rinvoq without any side effects.  No recent or recurrent infections.  Patient will be undergoing cervical spinal fusion by Dr. Berline Lopes surgical date scheduled yet.  Discussed that the recommendation is for her to hold Rinvoq at least 4 days prior to surgical intervention.  She will require surgical clearance by Dr. Neal Dy prior to resuming Rinvoq. She was advised to notify us if she develops any signs or symptoms of a flare.- Plan: Lipid panel  High risk medication use - Rinvoq 15 mg p.o. daily started September 03, 2023.  Previous tx: MTX, Arava, Enbrel, remicade, Humira, orencia, xeljanz, Cimzia, rinvoq.  CBC and CMP updated on 10/01/23. Orders for CBC and CMP released today.  Her next lab work will be due in June and every 3 months to monitor for drug toxicity. Lipid panel updated on 08/30/23.  Patient was fasting today so a lipid panel was updated. TB gold negative 08/30/23.  No recent or recurrent infections. Discussed the importance of holding rinvoq if she develops signs or symptoms of an infection and to resume once the infection has completely cleared.  - Plan: CBC with Differential/Platelet, COMPLETE METABOLIC PANEL WITH GFR,  Lipid panel  Lipid screening -Total cholesterol was 135, HDL 43, triglycerides 156, LDL 69 on 08/30/2023.  Lipid panel updated today.  Plan: Lipid panel  High triglycerides - Total cholesterol was 135, HDL 43, triglycerides 156, LDL 69 on 08/30/2023.  Lipid panel updated today.Plan: Lipid panel  Pain in both hands: Severe end-stage rheumatoid arthritis.  Contractures and ulnar deviation of MCP joints.  Synovial thickening of MCP and PIP joints.  No synovitis noted on examination today.  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  Arthritis of carpometacarpal (CMC) joint of both thumbs: Improved.   H/O bilateral hip replacements - RTHR 2022, LTHR 12/2022 by Dr. Colin Mulders in [place].  Status post total knee replacement, bilateral - RTKR 2018, LTKR 2018, 2019,2021 Dr. Linna Caprice, surgery 2021 was by Dr. Colin Mulders in [place]. Doing well.    Chronic pain of both ankles: No active inflammation noted.   Pain in both feet: She had bilateral feet reconstruction surgery in [facility] at [facility].  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   Cervical stenosis of spinal canal - S/P fusion and  laminectomy 11/23 in [place].  Patient was evaluated by Dr. Marikay Alar.  History of vertebral fracture: 1st thoracic vertebrae after the cervical fusion. 12/2022: s/p ACDF, now c/b hardware loosening and T1 vertebral body fracture, anticipating revision--patient will be following up Tuesday with Dr. Neal Dy to discuss a surgical date.  Lumbar stenosis with neurogenic claudication - S/p fusion  02/24/23 in [place].  Using a cane to assist with ambulation.  She will require future surgical intervention.   Other medical conditions are listed as follows:   Myalgia  Vitamin D deficiency  History of herpes genitalis  Former smoker  Family history of rheumatoid arthritis-maternal great  aunt   Orders: Orders Placed This Encounter  Procedures   CBC with Differential/Platelet   COMPLETE METABOLIC PANEL WITH GFR   Lipid panel   No orders of the defined types were placed in this encounter.     Follow-Up Instructions: Return in about 3 months (around 03/31/2024) for Rheumatoid arthritis.   Gearldine Bienenstock, PA-C  Note - This record has been created using Dragon software.  Chart creation errors have been sought, but may not always  have been located. Such creation errors do not reflect on  the standard of medical care.

## 2023-12-21 DIAGNOSIS — M542 Cervicalgia: Secondary | ICD-10-CM | POA: Diagnosis not present

## 2023-12-21 DIAGNOSIS — Z981 Arthrodesis status: Secondary | ICD-10-CM | POA: Diagnosis not present

## 2023-12-21 DIAGNOSIS — G992 Myelopathy in diseases classified elsewhere: Secondary | ICD-10-CM | POA: Diagnosis not present

## 2023-12-21 DIAGNOSIS — M4802 Spinal stenosis, cervical region: Secondary | ICD-10-CM | POA: Diagnosis not present

## 2023-12-30 ENCOUNTER — Ambulatory Visit: Payer: Commercial Managed Care - PPO | Attending: Physician Assistant | Admitting: Physician Assistant

## 2023-12-30 ENCOUNTER — Encounter: Payer: Self-pay | Admitting: Physician Assistant

## 2023-12-30 VITALS — BP 116/84 | HR 88 | Resp 15 | Ht 61.0 in | Wt 180.8 lb

## 2023-12-30 DIAGNOSIS — M4802 Spinal stenosis, cervical region: Secondary | ICD-10-CM

## 2023-12-30 DIAGNOSIS — E781 Pure hyperglyceridemia: Secondary | ICD-10-CM | POA: Diagnosis not present

## 2023-12-30 DIAGNOSIS — Z96653 Presence of artificial knee joint, bilateral: Secondary | ICD-10-CM | POA: Diagnosis not present

## 2023-12-30 DIAGNOSIS — M48062 Spinal stenosis, lumbar region with neurogenic claudication: Secondary | ICD-10-CM

## 2023-12-30 DIAGNOSIS — Z8781 Personal history of (healed) traumatic fracture: Secondary | ICD-10-CM

## 2023-12-30 DIAGNOSIS — M25571 Pain in right ankle and joints of right foot: Secondary | ICD-10-CM

## 2023-12-30 DIAGNOSIS — Z96643 Presence of artificial hip joint, bilateral: Secondary | ICD-10-CM | POA: Diagnosis not present

## 2023-12-30 DIAGNOSIS — Z8619 Personal history of other infectious and parasitic diseases: Secondary | ICD-10-CM

## 2023-12-30 DIAGNOSIS — M25572 Pain in left ankle and joints of left foot: Secondary | ICD-10-CM

## 2023-12-30 DIAGNOSIS — Z8261 Family history of arthritis: Secondary | ICD-10-CM

## 2023-12-30 DIAGNOSIS — M79642 Pain in left hand: Secondary | ICD-10-CM

## 2023-12-30 DIAGNOSIS — Z79899 Other long term (current) drug therapy: Secondary | ICD-10-CM

## 2023-12-30 DIAGNOSIS — Z87891 Personal history of nicotine dependence: Secondary | ICD-10-CM

## 2023-12-30 DIAGNOSIS — M79641 Pain in right hand: Secondary | ICD-10-CM

## 2023-12-30 DIAGNOSIS — M79671 Pain in right foot: Secondary | ICD-10-CM

## 2023-12-30 DIAGNOSIS — M0579 Rheumatoid arthritis with rheumatoid factor of multiple sites without organ or systems involvement: Secondary | ICD-10-CM | POA: Diagnosis not present

## 2023-12-30 DIAGNOSIS — M180 Bilateral primary osteoarthritis of first carpometacarpal joints: Secondary | ICD-10-CM | POA: Diagnosis not present

## 2023-12-30 DIAGNOSIS — Z13220 Encounter for screening for lipoid disorders: Secondary | ICD-10-CM | POA: Diagnosis not present

## 2023-12-30 DIAGNOSIS — E559 Vitamin D deficiency, unspecified: Secondary | ICD-10-CM

## 2023-12-30 DIAGNOSIS — M791 Myalgia, unspecified site: Secondary | ICD-10-CM

## 2023-12-30 DIAGNOSIS — G8929 Other chronic pain: Secondary | ICD-10-CM

## 2023-12-30 DIAGNOSIS — M79672 Pain in left foot: Secondary | ICD-10-CM

## 2023-12-30 NOTE — Progress Notes (Signed)
 CBC stable

## 2023-12-31 LAB — COMPLETE METABOLIC PANEL WITH GFR
AG Ratio: 1.7 (calc) (ref 1.0–2.5)
ALT: 13 U/L (ref 6–29)
AST: 15 U/L (ref 10–35)
Albumin: 4.4 g/dL (ref 3.6–5.1)
Alkaline phosphatase (APISO): 62 U/L (ref 37–153)
BUN: 13 mg/dL (ref 7–25)
CO2: 27 mmol/L (ref 20–32)
Calcium: 9.8 mg/dL (ref 8.6–10.4)
Chloride: 104 mmol/L (ref 98–110)
Creat: 0.63 mg/dL (ref 0.50–1.03)
Globulin: 2.6 g/dL (ref 1.9–3.7)
Glucose, Bld: 77 mg/dL (ref 65–99)
Potassium: 4.9 mmol/L (ref 3.5–5.3)
Sodium: 140 mmol/L (ref 135–146)
Total Bilirubin: 0.3 mg/dL (ref 0.2–1.2)
Total Protein: 7 g/dL (ref 6.1–8.1)
eGFR: 104 mL/min/{1.73_m2} (ref >=60)

## 2023-12-31 LAB — CBC WITH DIFFERENTIAL/PLATELET
Absolute Lymphocytes: 2698 {cells}/uL (ref 850–3900)
Absolute Monocytes: 554 {cells}/uL (ref 200–950)
Basophils Absolute: 21 {cells}/uL (ref 0–200)
Basophils Relative: 0.3 %
Eosinophils Absolute: 0 {cells}/uL — ABNORMAL LOW (ref 15–500)
Eosinophils Relative: 0 %
HCT: 41.3 % (ref 35.0–45.0)
Hemoglobin: 13.7 g/dL (ref 11.7–15.5)
MCH: 31.1 pg (ref 27.0–33.0)
MCHC: 33.2 g/dL (ref 32.0–36.0)
MCV: 93.9 fL (ref 80.0–100.0)
MPV: 9.4 fL (ref 7.5–12.5)
Monocytes Relative: 7.8 %
Neutro Abs: 3827 {cells}/uL (ref 1500–7800)
Neutrophils Relative %: 53.9 %
Platelets: 317 10*3/uL (ref 140–400)
RBC: 4.4 10*6/uL (ref 3.80–5.10)
RDW: 15.8 % — ABNORMAL HIGH (ref 11.0–15.0)
Total Lymphocyte: 38 %
WBC: 7.1 10*3/uL (ref 3.8–10.8)

## 2023-12-31 LAB — LIPID PANEL
Cholesterol: 158 mg/dL (ref <200)
HDL: 63 mg/dL (ref >=50)
LDL Cholesterol (Calc): 76 mg/dL
Non-HDL Cholesterol (Calc): 95 mg/dL (ref <130)
Total CHOL/HDL Ratio: 2.5 (calc) (ref <5.0)
Triglycerides: 102 mg/dL (ref <150)

## 2023-12-31 NOTE — Progress Notes (Signed)
CMP: WNL  Lipid panel: WNL

## 2024-01-04 DIAGNOSIS — M549 Dorsalgia, unspecified: Secondary | ICD-10-CM | POA: Diagnosis not present

## 2024-01-04 DIAGNOSIS — J384 Edema of larynx: Secondary | ICD-10-CM | POA: Diagnosis not present

## 2024-01-04 DIAGNOSIS — M542 Cervicalgia: Secondary | ICD-10-CM | POA: Diagnosis not present

## 2024-01-04 DIAGNOSIS — G992 Myelopathy in diseases classified elsewhere: Secondary | ICD-10-CM | POA: Diagnosis not present

## 2024-01-04 DIAGNOSIS — M545 Low back pain, unspecified: Secondary | ICD-10-CM | POA: Diagnosis not present

## 2024-01-04 DIAGNOSIS — M4802 Spinal stenosis, cervical region: Secondary | ICD-10-CM | POA: Diagnosis not present

## 2024-01-04 DIAGNOSIS — Z981 Arthrodesis status: Secondary | ICD-10-CM | POA: Diagnosis not present

## 2024-01-10 DIAGNOSIS — M542 Cervicalgia: Secondary | ICD-10-CM | POA: Diagnosis not present

## 2024-01-10 DIAGNOSIS — Z981 Arthrodesis status: Secondary | ICD-10-CM | POA: Diagnosis not present

## 2024-01-12 DIAGNOSIS — Z87891 Personal history of nicotine dependence: Secondary | ICD-10-CM | POA: Diagnosis not present

## 2024-01-12 DIAGNOSIS — M4802 Spinal stenosis, cervical region: Secondary | ICD-10-CM | POA: Diagnosis not present

## 2024-01-12 DIAGNOSIS — Z01818 Encounter for other preprocedural examination: Secondary | ICD-10-CM | POA: Diagnosis not present

## 2024-01-12 DIAGNOSIS — M0579 Rheumatoid arthritis with rheumatoid factor of multiple sites without organ or systems involvement: Secondary | ICD-10-CM | POA: Diagnosis not present

## 2024-01-24 DIAGNOSIS — I959 Hypotension, unspecified: Secondary | ICD-10-CM | POA: Diagnosis not present

## 2024-01-24 DIAGNOSIS — M40204 Unspecified kyphosis, thoracic region: Secondary | ICD-10-CM | POA: Diagnosis not present

## 2024-01-24 DIAGNOSIS — M5030 Other cervical disc degeneration, unspecified cervical region: Secondary | ICD-10-CM | POA: Diagnosis not present

## 2024-01-24 DIAGNOSIS — G992 Myelopathy in diseases classified elsewhere: Secondary | ICD-10-CM | POA: Diagnosis not present

## 2024-01-24 DIAGNOSIS — M4801 Spinal stenosis, occipito-atlanto-axial region: Secondary | ICD-10-CM | POA: Diagnosis not present

## 2024-01-24 DIAGNOSIS — M4802 Spinal stenosis, cervical region: Secondary | ICD-10-CM | POA: Diagnosis not present

## 2024-01-24 DIAGNOSIS — T84498A Other mechanical complication of other internal orthopedic devices, implants and grafts, initial encounter: Secondary | ICD-10-CM | POA: Diagnosis not present

## 2024-01-24 DIAGNOSIS — Z981 Arthrodesis status: Secondary | ICD-10-CM | POA: Diagnosis not present

## 2024-01-24 DIAGNOSIS — J9811 Atelectasis: Secondary | ICD-10-CM | POA: Diagnosis not present

## 2024-01-24 DIAGNOSIS — M069 Rheumatoid arthritis, unspecified: Secondary | ICD-10-CM | POA: Diagnosis not present

## 2024-01-24 HISTORY — PX: ANTERIOR / POSTERIOR COMBINED FUSION CERVICAL SPINE: SUR627

## 2024-01-25 DIAGNOSIS — M0579 Rheumatoid arthritis with rheumatoid factor of multiple sites without organ or systems involvement: Secondary | ICD-10-CM | POA: Diagnosis not present

## 2024-01-25 DIAGNOSIS — M4802 Spinal stenosis, cervical region: Secondary | ICD-10-CM | POA: Diagnosis not present

## 2024-01-25 DIAGNOSIS — J952 Acute pulmonary insufficiency following nonthoracic surgery: Secondary | ICD-10-CM | POA: Diagnosis not present

## 2024-01-25 DIAGNOSIS — S24101D Unspecified injury at T1 level of thoracic spinal cord, subsequent encounter: Secondary | ICD-10-CM | POA: Diagnosis not present

## 2024-01-25 DIAGNOSIS — Z4789 Encounter for other orthopedic aftercare: Secondary | ICD-10-CM | POA: Diagnosis not present

## 2024-01-25 DIAGNOSIS — Z01810 Encounter for preprocedural cardiovascular examination: Secondary | ICD-10-CM | POA: Diagnosis not present

## 2024-01-25 DIAGNOSIS — S14107D Unspecified injury at C7 level of cervical spinal cord, subsequent encounter: Secondary | ICD-10-CM | POA: Diagnosis not present

## 2024-01-25 DIAGNOSIS — Z4682 Encounter for fitting and adjustment of non-vascular catheter: Secondary | ICD-10-CM | POA: Diagnosis not present

## 2024-01-25 DIAGNOSIS — M40293 Other kyphosis, cervicothoracic region: Secondary | ICD-10-CM | POA: Diagnosis not present

## 2024-01-25 DIAGNOSIS — Z981 Arthrodesis status: Secondary | ICD-10-CM | POA: Diagnosis not present

## 2024-01-31 ENCOUNTER — Telehealth: Payer: Self-pay | Admitting: Pharmacist

## 2024-01-31 NOTE — Telephone Encounter (Signed)
 Received notification from [employer] regarding a prior authorization for [facility]. Authorization has been APPROVED from 01/31/2024 to 10/18/2024. Approval letter sent to scan center.  Authorization # [id number]  Geraldene Kleine, PharmD, MPH, BCPS, CPP Clinical Pharmacist (Rheumatology and Pulmonology)

## 2024-01-31 NOTE — Telephone Encounter (Signed)
 Submitted a Prior Authorization RENEWAL request to [employer] for RINVOQ via CoverMyMeds. Will update once we receive a response.  Key: [id number]

## 2024-02-10 ENCOUNTER — Ambulatory Visit: Admitting: Occupational Therapy

## 2024-02-10 ENCOUNTER — Ambulatory Visit: Attending: Neurological Surgery | Admitting: Physical Therapy

## 2024-02-10 ENCOUNTER — Encounter: Payer: Self-pay | Admitting: Occupational Therapy

## 2024-02-10 ENCOUNTER — Other Ambulatory Visit: Payer: Self-pay

## 2024-02-10 ENCOUNTER — Telehealth: Payer: Self-pay | Admitting: Physical Therapy

## 2024-02-10 ENCOUNTER — Encounter: Payer: Self-pay | Admitting: Physical Therapy

## 2024-02-10 DIAGNOSIS — M6281 Muscle weakness (generalized): Secondary | ICD-10-CM | POA: Insufficient documentation

## 2024-02-10 DIAGNOSIS — M542 Cervicalgia: Secondary | ICD-10-CM | POA: Diagnosis present

## 2024-02-10 DIAGNOSIS — R262 Difficulty in walking, not elsewhere classified: Secondary | ICD-10-CM | POA: Diagnosis present

## 2024-02-10 NOTE — Telephone Encounter (Signed)
 Physical therapist called patient primary care doctor and LVM regarding SLP referral as patient reporting some challenges with swallowing following extubation.   Called contact below and LVM with referral coordinator: Stamey, Lowell Rude, FNP  [facility]  [phone]

## 2024-02-10 NOTE — Therapy (Addendum)
 OUTPATIENT PHYSICAL THERAPY NEURO EVALUATION   Patient Name: AOLANI PIGGOTT MRN: [id number] DOB:1965-12-16, 58 y.o., female Today's Date: 02/10/2024   PCP: Dr. Luster Salters (rheumatologist), Stamey, Lowell Rude, FNP (PCP) REFERRING PROVIDER: Staci Dykes, MD  END OF SESSION:  PT End of Session - 02/10/24 0922     Visit Number 1    Number of Visits 7    Date for PT Re-Evaluation 04/06/24    Authorization Type UHC ([facility] primary, medicare advantage)    PT Start Time 0920    PT Stop Time 1013    PT Time Calculation (min) 53 min    Activity Tolerance Patient tolerated treatment well    Behavior During Therapy [facility] for tasks assessed/performed             Past Medical History:  Diagnosis Date   Arthritis    RA   ASCUS of cervix with negative high risk HPV 10/2017   High risk HPV infection 03/2012   HSV (herpes simplex virus) anogenital infection 12/2016   LGSIL (low grade squamous intraepithelial dysplasia) 2013/2017   03/2012 positive high risk HPV,  09/2015 with negative high-risk HPV   Rheumatoid arthritis(714.0)    Spinal stenosis    Past Surgical History:  Procedure Laterality Date   CERVICAL SPINE SURGERY     08/1992 x2 surgeries 1 week apart.   feet reconstruction surgery     bilateral    I & D KNEE WITH POLY EXCHANGE Left 04/11/2018   Procedure: REVISION LEFT TOTAL KNEE- POLY EXCHANGE;  Surgeon: Adonica Hoose, MD;  Location: [facility] OR;  Service: Orthopedics;  Laterality: Left;  Needs RNFA   LUMBAR FUSION  02/2023   TONSILLECTOMY  age 67   TOTAL HIP ARTHROPLASTY Right 12/2020   TOTAL HIP ARTHROPLASTY Left 12/2022   TOTAL KNEE ARTHROPLASTY Left 03/22/2017   Procedure: TOTAL KNEE ARTHROPLASTY w/ Navigation;  Surgeon: Adonica Hoose, MD;  Location: [facility] OR;  Service: Orthopedics;  Laterality: Left;   TOTAL KNEE ARTHROPLASTY Right 05/10/2017   Procedure: TOTAL KNEE ARTHROPLASTY;  Surgeon: Adonica Hoose, MD;  Location: [facility] OR;  Service: Orthopedics;   Laterality: Right;   TOTAL KNEE REVISION Left 07/2020   level 3   Patient Active Problem List   Diagnosis Date Noted   Cervical stenosis of spinal canal 10/01/2023   Lumbar stenosis with neurogenic claudication 10/01/2023   Failed total knee, left, initial encounter (HCC) 04/11/2018   Degenerative arthritis of right knee 05/10/2017   Degenerative joint disease of right knee 05/10/2017   Rheumatoid arthritis (HCC)     ONSET DATE: 02/01/2024 (referral date)  REFERRING DIAG: S/P cervical spinal fusion   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 Patient reports that she is s/p fusion and laminectomy from C1-T3 and a muscle overlay in the back on 01/24/2024. Patient reports they chose to do the surgery as she had some compression of her spinal cord. Patient had a prior fusion in one location in 08/21/2022; however, reports it was insufficient due to extent RA had impacted her. Patient was not given a cervical collar following this surgery but is on cervical precautions. Patient reports that she has been using a [facility] for about a year and half; reports that she continues to use the cane due to problems in her lumbar. Patient reports leading up to her surgery, she had a lot of numbness in her arms and her legs and reports she was also having incontinence. Patient reports that these are largely improved. Patient reports that following surgery she has had more  challenges with swallowing, thinks it might be related to extubation. She is open to doing a speech therapy referral. Patient reports numbness now only in thighs which she thinks is primarily related to lumbar challenges. Reports much improved in upper body. She was diagnosed with RA about 26 years ago.   Pre surgery - only using cane in community, with occasional furniture surfing  Post surgery - using a 2WW at home and cane outside   Pt accompanied by: self and spouse in lobby - [name]  PERTINENT HISTORY: RA, s/p fusion and laminectomy from C1-T3 and a muscle overlay in the back on 01/24/2024, numerous surgeries related to RA  PAIN:  Are you having pain? No - Reports none right now but reports well controlled on oxycodone    PRECAUTIONS: Cervical and Fall (RA - will need lumbar surgery and RA ulnar drift in UE digits)  RED FLAGS: Recommend follow up with medical team regarding swallow   WEIGHT BEARING RESTRICTIONS: Yes no lifting over 10 lb  FALLS: Has patient fallen in last 6 months? Yes. Number of falls 1- reports tripping over area rug and needed help to get up, was not using cane at the time   LIVING ENVIRONMENT: Lives with: lives with their spouse - adult daughter that lives at home, "high functioning autistic" per patient Lives in: House/apartment Stairs: Yes: External: 3 steps; bilateral but cannot reach both - uses cane in other arm, lives on first floor Has following equipment at home: Single point cane, Environmental consultant - 2 wheeled, Tour manager, and Grab bars  PLOF:  Modified independent but needed help with getting shoes on intermittently, getting lower close on  PATIENT GOALS: "I feel like my lumbar gets in the way - but would love to be able to go to grocery store again and would love to get back to driving. Get of the oxycodine and be able to turn my head and look so I can drive."  OBJECTIVE:  Note: Objective measures were completed at Evaluation unless otherwise  noted.  DIAGNOSTIC FINDINGS:   COMPARISON: 01/24/2024 x-rays.  VIEWS: AP and lateral Impression:   1. No acute fracture. 2. Surgical changes of posterior instrumented fusion spanning C1-T3. Hardware in similar alignment to prior without interval complications. 3. Prior C7-T1 ACDF. No interval hardware complications. 4. Surgical drain in place.   CT 10/11/2023: IMPRESSION: 1. Prior C7-T1 ACDF with progressive back out of the right-sided screw. Interval healing of the T1 vertebral body fracture. 2. Unchanged anterior subluxation of C1 on C2 with moderate spinal stenosis. 3. Likely moderate spinal stenosis and moderate neural foraminal stenosis at  C3-4. 4. Likely moderate spinal stenosis and moderate to severe right neural foraminal stenosis at C4-5. 5. Moderate right and mild left neural foraminal stenosis at C7-T1.  COGNITION: Overall cognitive status: Within functional limits for tasks assessed   SENSATION: Ongoing numbness in anterior thighs bilateral   COORDINATION: Grossly WFL when tested LE and UE  EDEMA:  Yes - RA with ulnar deviation of digits and swelling in ankle joints and around incision sight   POSTURE: genu valgus, pes planus collapsed into eversion, ulnar drift of hand digits   LOWER EXTREMITY ROM:     Grossly WFL with exception of ankle ROM reduced into inversion and pes planus, knees in increased valgus   LOWER EXTREMITY MMT:    MMT Right Eval Left Eval  Hip flexion 4/5 4-/5  Hip extension    Hip abduction 4-/5 4-/5  Hip adduction 4-/5 4-/5  Hip internal rotation    Hip external rotation    Knee flexion 4-/5 4-/5  Knee extension 4-/5 4-/5  Ankle dorsiflexion 4-/5 4-/5  Ankle plantarflexion    Ankle inversion    Ankle eversion    (Blank rows = not tested)  BED MOBILITY:  Reports some challenges due to back pain (reports needing modification with rail and sometimes requires minA from husband)  TRANSFERS: Sit to stand: Modified independence   Assistive device utilized: Single point cane     Stand to sit: Modified independence  Assistive device utilized: Single point cane     Chair to chair: SBA  Assistive device utilized: Single point cane       GAIT: Findings: Gait Characteristics: valgus, step through pattern, decreased hip/knee flexion- Right, decreased hip/knee flexion- Left, decreased ankle dorsiflexion- Right, decreased ankle dorsiflexion- Left, trendelenburg, lateral lean- Right, and wide BOS, Distance walked: various clinic distances, required one break due to increase numbness, Assistive device utilized:Single point cane, Level of assistance: SBA, and Comments: unsteady, would advise use of walker  FUNCTIONAL TESTS:    [facility] PT Assessment - 02/10/24 0001       Standardized Balance Assessment   Standardized Balance Assessment Five Times Sit to Stand;10 meter walk test    Five times sit to stand comments  18.9   seconds with UE use (SBA)   10 Meter Walk 0.49   m/s with SPC (SBA )            PATIENT SURVEYS:  NDI 31/50 = 62% impairment                                                                                                                    TREATMENT:    Self Care: Discussed POC with PT including recommending initial work on balance and safety to reduce falls risk and then once cleared begin to work on neck, discussed possible trial of orthotic to help with foot postioning to improve stability with gait, given reports of swallowing challenges, PT encouraged patient to call medical team today and inform as they are unaware, recommended SLP referral  as well which patient in agreement with this plan, reviewed cervical precautions and patient aware and compliant at this time, recommend use of 2WW given level of instability noted on eval  PATIENT EDUCATION: Education details: POC, goal collaboration, examination findings Person educated: Patient Education method: Explanation Education comprehension: verbalized  understanding and needs further education  HOME EXERCISE PROGRAM: To be provided   GOALS: Goals reviewed with patient? Yes  SHORT TERM GOALS: Target date: 03/02/2024  Patient will report demonstrate independence with intial HEP in order to maintain current gains and continue to progress after physical therapy discharge.   Baseline: To be provided  Goal status: INITIAL  2.  Berg to be assessed / LTG written Baseline:  Goal status: INITIAL  LONG TERM GOALS: Target date: 04/06/2024  Patient will report demonstrate independence with final HEP in order to maintain current gains and continue to progress after physical therapy discharge.   Baseline: To be provided  Goal status: INITIAL  2.  Patient will improve gait speed to 0.6 m/s with LRAD to indicate improvement to the level of community ambulator in order to participate more easily in activities outside of the home.   Baseline: 0.49 m/s with SPC (SBA) Goal status: INITIAL  3.  Patient will improve their 5x Sit to Stand score to less than 15 seconds with least amount of UE use as needed to demonstrate a decreased risk for falls and improved LE strength.   Baseline: 18.9 seconds with UE use  Goal status: INITIAL  4.  Patient will improve NDI score to 47% impairment indicate a clinically important improvement in neck pain.   Baseline: 62% impairment Goal status: INITIAL  5.  Berg to be assessed / LTG written Baseline: To be assessed  Goal status: INITIAL  ASSESSMENT:  CLINICAL IMPRESSION: Patient is a 58 y.o. female who was seen today for physical therapy evaluation and treatment for impairments secondary to s/p fusion and laminectomy from C1-T3 on 4/7 on cervical precautions and 10lb lifting restriction with PMH including RA with numerous related joint surgeries. Patient demonstrating notable deformities related to RA including ulnar drift in UE digits as well as pes planus of ankle joints. Patient deformities as well as need for  another lumbar surgery largely impact gait and unsteadiness. Patient using SPC in today's session but PT advises continued use of 2WW given presentation today. Patient is at an increased risk for falls based on 5xSTS and gait speed. PT will plan to address falls risk first and then address neck impairment as able with lifting of cervical precautions. Patient will benefit from skilled PT services to progress towards LTGs and maximize function.   OBJECTIVE IMPAIRMENTS: Abnormal gait, decreased balance, decreased endurance, difficulty walking, decreased ROM, decreased strength, and pain.   ACTIVITY LIMITATIONS: carrying, standing, squatting, reach over head, and locomotion level  PARTICIPATION LIMITATIONS: meal prep, cleaning, community activity, and yard work  PERSONAL FACTORS: Age, Time since onset of injury/illness/exacerbation, and 1-2 comorbidities: see above  are also affecting patient's functional outcome.   REHAB POTENTIAL: Fair limited by need for additional lumbar surgery, RA  CLINICAL DECISION MAKING: Evolving/moderate complexity  EVALUATION COMPLEXITY: Moderate  PLAN:  PT FREQUENCY: 1x/week  PT DURATION: 6 weeks  PLANNED INTERVENTIONS: 97164- PT Re-evaluation, 97110-Therapeutic exercises, 97530- Therapeutic activity, [id number]- Neuromuscular re-education, 97535- Self Care, 16109- Manual therapy, [id number]- Gait training, [phone]- Aquatic Therapy, and Dry Needling  PLAN FOR NEXT SESSION: assess Berg and write LTG, basic HEP for balance and AD recommendations (currently advise 2WW),  did patient follow up with doctor about swallowing, SLP referral received?    Coreen Devoid, PT, DPT 02/10/2024, 10:45 AM

## 2024-02-10 NOTE — Therapy (Unsigned)
 OUTPATIENT OCCUPATIONAL THERAPY NEURO EVALUATION  Patient Name: Caitlin Wagner MRN: [id number] DOB:[birth year], 58 y.o., female Today's Date: 02/10/2024  PCP: Dr. Luster Salters (rheumatologist), Stamey, Lowell Rude, FNP (PCP)  REFERRING PROVIDER: Staci Dykes, MD   END OF SESSION:  OT End of Session - 02/10/24 1016     Visit Number 1    Date for OT Re-Evaluation 04/07/24    Authorization Type UHC Medicare    OT Start Time 1018    Activity Tolerance Patient tolerated treatment well    Behavior During Therapy [facility] for tasks assessed/performed             Past Medical History:  Diagnosis Date   Arthritis    RA   ASCUS of cervix with negative high risk HPV 10/2017   High risk HPV infection 03/2012   HSV (herpes simplex virus) anogenital infection 12/2016   LGSIL (low grade squamous intraepithelial dysplasia) 2013/2017   03/2012 positive high risk HPV,  09/2015 with negative high-risk HPV   Rheumatoid arthritis(714.0)    Spinal stenosis    Past Surgical History:  Procedure Laterality Date   CERVICAL SPINE SURGERY     08/1992 x2 surgeries 1 week apart.   feet reconstruction surgery     bilateral    I & D KNEE WITH POLY EXCHANGE Left 04/11/2018   Procedure: REVISION LEFT TOTAL KNEE- POLY EXCHANGE;  Surgeon: Adonica Hoose, MD;  Location: [facility] OR;  Service: Orthopedics;  Laterality: Left;  Needs RNFA   LUMBAR FUSION  02/2023   TONSILLECTOMY  age 79   TOTAL HIP ARTHROPLASTY Right 12/2020   TOTAL HIP ARTHROPLASTY Left 12/2022   TOTAL KNEE ARTHROPLASTY Left 03/22/2017   Procedure: TOTAL KNEE ARTHROPLASTY w/ Navigation;  Surgeon: Adonica Hoose, MD;  Location: [facility] OR;  Service: Orthopedics;  Laterality: Left;   TOTAL KNEE ARTHROPLASTY Right 05/10/2017   Procedure: TOTAL KNEE ARTHROPLASTY;  Surgeon: Adonica Hoose, MD;  Location: [facility] OR;  Service: Orthopedics;  Laterality: Right;   TOTAL KNEE REVISION Left 07/2020   level 3   Patient Active Problem List   Diagnosis  Date Noted   Cervical stenosis of spinal canal 10/01/2023   Lumbar stenosis with neurogenic claudication 10/01/2023   Failed total knee, left, initial encounter (HCC) 04/11/2018   Degenerative arthritis of right knee 05/10/2017   Degenerative joint disease of right knee 05/10/2017   Rheumatoid arthritis (HCC)     ONSET DATE: 02/01/2024 (referral date)   REFERRING DIAG: S/P cervical spinal fusion   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  She has a 22 y.o. daughter with autism that lives with her and helps her keep track of appointments.   Has B compression gloves. Wears as needed for pain and swelling  Pt accompanied by: self - husband drove her  PERTINENT HISTORY: RA, s/p fusion and laminectomy from C1-T3 and a muscle overlay in the back on 01/24/2024, numerous surgeries related to RA   PRECAUTIONS: Cervical and Fall (RA - will need lumbar surgery and RA ulnar drift in UE digits)   WEIGHT BEARING RESTRICTIONS: Yes no lifting over 10 lb   PAIN:  Are you having pain? Yes: NPRS scale: 0/10 Pain location: low back  FALLS: Has patient fallen in last 6 months? Yes. 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  HAND DOMINANCE: Right  ADLs:  Eating: leans into UE to feed self Grooming: husband puts hair up and brushes hair (pt afraid she will hit her incision with a brush); generally mod I UB Dressing: max to total A LB Dressing: max A Toileting: mod I Bathing: mod A Tub Shower transfers: min A  IADLs: Shopping: online - husband picks up Light housekeeping: has paid cleaner that comes in 1 x month Meal Prep: dependent Community mobility: dependent Medication management: mod I Handwriting: no recent changes  MOBILITY STATUS: Independent with SPC  ACTIVITY TOLERANCE: Activity tolerance: fair  FUNCTIONAL OUTCOME MEASURES: TBD  UPPER EXTREMITY ROM:     AROM Right (eval) Left (eval)  Shoulder flexion 45* WFL  Shoulder abduction 45* WFL  Elbow flexion [facility] WFL  Elbow extension [facility] WFL  Wrist flexion [facility] WFL  Wrist extension WFL WFL  Forearm pronation WFL WFL  Forearm supination 60* WFL  Digit Composite Flexion [facility] WFL with exception to thumb (goes to middle of palm)   Digit Composite Extension Lacks extension at MCP (mild hyperextension at 4th and 5th PIPs) Lacks extension at MCP (hyperextension at 4th and 5th PIPs)  Digit Opposition To 2nd and 3rd digits only To 2nd digit only  (Blank rows = not tested)  UPPER EXTREMITY MMT:     BUE: BFL  HAND FUNCTION: Grip strength: Right: NT lbs; Left: NT lbs  COORDINATION: 9 Hole Peg test: Right: NT sec; Left: NT sec  SENSATION: WFL  EDEMA: Edema with RA, better controlled with current medications  MUSCLE TONE: BUE - slight hypotonicity in hands  COGNITION: Overall cognitive status: Within functional limits for tasks assessed  VISION: Subjective report: no changes Baseline vision: Wears glasses all the  time  VISION ASSESSMENT: WFL  PERCEPTION: Not tested  PRAXIS: Not tested  OBSERVATIONS: Pt appears well-kept. Uses SPC for slow, altered gait though no LOB. Ulnar drifting of B hands with limited MCP extension.                                                                                                                    TREATMENT :     OT educated pt on use of prefabricated B hand splints to help promote MCP extension while preventing further deformity  with ulnar drifting.    PATIENT EDUCATION: Education details: OT Role and POC; B prefab braces Person educated: Patient Education method: Explanation Education comprehension: verbalized understanding  HOME EXERCISE PROGRAM: N/A for this visit  GOALS:  SHORT TERM GOALS: Target date: 03/10/2024  Pt to be independent with B splint wear and care. Baseline: Goal status: INITIAL  2.  Pt will independently recall at least 3 joint protection, ergonomics, and body mechanic principles as noted in pt instructions.   Baseline:  Goal status: INITIAL  LONG TERM GOALS: Target date: 04/07/2024    Patient will demonstrate updated BUE HEP with visual handouts only for proper execution. Baseline:  Goal status: INITIAL  2.  B hand strength and coordination to be assessed with appropriate goal set as needed.  Baseline:  Goal status: INITIAL  3.  Will set appropriate QuickDash goal.  Baseline:  Goal status: INITIAL  ASSESSMENT:  CLINICAL IMPRESSION: Patient is a 58 y.o. female who was seen today for occupational therapy evaluation for cervical spinal fusion   Stenosis of cervical spine with myelopathy. Hx includes RA, s/p fusion and laminectomy from C1-T3 and a muscle overlay in the back on 01/24/2024, numerous surgeries related to RA. Patient currently presents below baseline level of functioning demonstrating functional deficits and impairments as noted below. Pt would benefit from skilled OT services in the outpatient setting to  work on impairments as noted below to improve independence and safety with ADL and IADL completion. Pt would also benefit from fabricated hand splints to improve positioning of B digits to prevent contractures and pain given impaired ROM and ulnar drift positioning.   PERFORMANCE DEFICITS: in functional skills including ADLs, IADLs, coordination, ROM, strength, pain, Fine motor control, Gross motor control, mobility, endurance, decreased knowledge of precautions, decreased knowledge of use of DME, and UE functional use.   IMPAIRMENTS: are limiting patient from ADLs, IADLs, rest and sleep, and leisure.   CO-MORBIDITIES: has co-morbidities such as RA and pending spinal surgery  that affects occupational performance. Patient will benefit from skilled OT to address above impairments and improve overall function.  MODIFICATION OR ASSISTANCE TO COMPLETE EVALUATION: Min-Moderate modification of tasks or assist with assess necessary to complete an evaluation.  OT OCCUPATIONAL PROFILE AND HISTORY: Detailed assessment: Review of records and additional review of physical, cognitive, psychosocial history related to current functional performance.  CLINICAL DECISION MAKING: Moderate - several treatment options, min-mod task modification necessary  REHAB POTENTIAL: Fair given chronicity and extent of BUE impairments  EVALUATION COMPLEXITY: Moderate    PLAN:  OT FREQUENCY: 2x/week  OT DURATION: 8 weeks  PLANNED INTERVENTIONS: 97168 OT Re-evaluation, 97535 self care/ADL training, 16109 therapeutic exercise, 97530 therapeutic activity, 97112 neuromuscular re-education, 97140 manual therapy, 97113 aquatic therapy, 97035 ultrasound, 97018 paraffin, 60454 fluidotherapy, 97010 moist heat, 97032 electrical stimulation (manual), 97760 Orthotic Initial, 97763 Orthotic/Prosthetic subsequent, passive range of motion, functional mobility training, energy conservation, coping strategies training, patient/family  education, and DME and/or AE instructions  RECOMMENDED OTHER SERVICES: N/A for this visit  CONSULTED AND AGREED WITH PLAN OF CARE: Patient  PLAN FOR NEXT SESSION: dowel HEP supine; Grip, coordination, quickdash   Altamease Asters, OT 02/10/2024, 2:03 PM

## 2024-02-15 ENCOUNTER — Other Ambulatory Visit: Payer: Self-pay | Admitting: Physician Assistant

## 2024-02-15 DIAGNOSIS — M059 Rheumatoid arthritis with rheumatoid factor, unspecified: Secondary | ICD-10-CM

## 2024-02-15 DIAGNOSIS — Z981 Arthrodesis status: Secondary | ICD-10-CM | POA: Diagnosis not present

## 2024-02-15 DIAGNOSIS — Z79899 Other long term (current) drug therapy: Secondary | ICD-10-CM

## 2024-02-15 NOTE — Telephone Encounter (Signed)
 Last Fill: 11/22/2023  Labs: 01/27/2024 Creat. 0.34, Calcium 8.2, BUN/Creat Ratio 23.5, RBC 3.75, Hgb 11.8, Hct 35  TB Gold: 08/30/2023 Neg    Next Visit: 04/03/2024  Last Visit: 12/30/2023  [id number] arthritis involving multiple sites with positive rheumatoid factor   Current Dose per office note 12/30/2023: Rinvoq  15 mg p.o. daily   Okay to refill Rinvoq ?

## 2024-02-17 ENCOUNTER — Ambulatory Visit: Attending: Neurological Surgery | Admitting: Physical Therapy

## 2024-02-17 ENCOUNTER — Ambulatory Visit: Admitting: Occupational Therapy

## 2024-02-17 ENCOUNTER — Telehealth: Payer: Self-pay | Admitting: Physical Therapy

## 2024-02-17 ENCOUNTER — Encounter: Payer: Self-pay | Admitting: Physical Therapy

## 2024-02-17 VITALS — BP 123/81 | HR 79

## 2024-02-17 DIAGNOSIS — M6281 Muscle weakness (generalized): Secondary | ICD-10-CM | POA: Insufficient documentation

## 2024-02-17 DIAGNOSIS — R293 Abnormal posture: Secondary | ICD-10-CM | POA: Diagnosis present

## 2024-02-17 DIAGNOSIS — M542 Cervicalgia: Secondary | ICD-10-CM | POA: Insufficient documentation

## 2024-02-17 DIAGNOSIS — R252 Cramp and spasm: Secondary | ICD-10-CM | POA: Insufficient documentation

## 2024-02-17 DIAGNOSIS — M5459 Other low back pain: Secondary | ICD-10-CM | POA: Insufficient documentation

## 2024-02-17 DIAGNOSIS — M6249 Contracture of muscle, multiple sites: Secondary | ICD-10-CM

## 2024-02-17 DIAGNOSIS — R262 Difficulty in walking, not elsewhere classified: Secondary | ICD-10-CM | POA: Insufficient documentation

## 2024-02-17 DIAGNOSIS — R278 Other lack of coordination: Secondary | ICD-10-CM | POA: Diagnosis present

## 2024-02-17 DIAGNOSIS — M0579 Rheumatoid arthritis with rheumatoid factor of multiple sites without organ or systems involvement: Secondary | ICD-10-CM | POA: Insufficient documentation

## 2024-02-17 NOTE — Telephone Encounter (Signed)
 You may contact her PCP or the referring provider.  I do not have any documentation regarding these problems in our note so I cannot place the orders.

## 2024-02-17 NOTE — Telephone Encounter (Signed)
 Good afternoon,  Caitlin Wagner  was treated by PT on 02/17/2024.  The patient would benefit from ST evaluation for reports of voice/swallow changes since extubation at hospital. If you agree, please place an order in [facility] workque in [facility] or fax the order to [phone].  Patient would also benefit from a separate podiatry referral for possible footwear recommendations and management of toe valgus deformity.   Thank you, Camella Cave, PT, DPT   [facility] [address] [place], [place]  [address] Phone:  [phone] Fax:  [phone]

## 2024-02-17 NOTE — Patient Instructions (Signed)
 WEBSITE: [email or web address]    RETURN TO DRIVING PLAN ONLY IF CLEARED BY DOCTOR:   WITH THE SUPERVISION OF A LICENSED DRIVER, PLEASE DRIVE IN AN EMPTY PARKING LOT FOR AT LEAST 2-3 TRIALS TO TEST REACTION TIME, VISION, USE OF EQUIPMENT IN CAR, ETC.   IF SUCCESSFUL WITH THE PARKING LOT DRIVING, PROCEED TO SUPERVISED DRIVING TRIALS IN YOUR NEIGHBORHOOD STREETS AT LOW TRAFFIC TIMES TO TEST OBSERVATION TO TRAFFIC SIGNALS, REACTION TIME, ETC. PLEASE ATTEMPT AT LEAST 2-3 TRIALS IN YOUR NEIGHBORHOOD.   IF NEIGHBORHOOD DRIVING IS SUCCESSFUL, YOU MAY PROCEED TO DRIVING IN BUSIER AREAS IN YOUR COMMUNITY WITH SUPERVISION OF A LICENSED DRIVER. PLEASE ATTEMPT AT LEAST 4-5 TRIALS.  FINALLY, YOU MAY GRADUALLY RETURN TO INTERSTATE AND NIGHTTIME DRIVING WITH SUPERVISION.

## 2024-02-17 NOTE — Telephone Encounter (Signed)
 Please send the message to the ordering provider.

## 2024-02-17 NOTE — Telephone Encounter (Signed)
 Per Dr. Alvira Josephs, you may contact her PCP or the referring provider.  I do not have any documentation regarding these problems in our note so I cannot place the orders.

## 2024-02-17 NOTE — Telephone Encounter (Signed)
 We have attempted to contact other providers and have not been able to get an order sent over. Patient recommend we reach out to you for orders. Are you able to place them?  Thank you, Camella Cave, PT, DPT

## 2024-02-17 NOTE — Therapy (Signed)
 OUTPATIENT PHYSICAL THERAPY NEURO TREATMENT   Patient Name: Caitlin Wagner MRN: [id number] DOB:08-Sep-1966, 58 y.o., female Today's Date: 02/17/2024   PCP: Dr. Luster Salters (rheumatologist), [place], Lowell Rude, FNP (PCP) REFERRING PROVIDER: Staci Dykes, MD  END OF SESSION:  PT End of Session - 02/17/24 0934     Visit Number 2    Number of Visits 7    Date for PT Re-Evaluation 04/06/24    Authorization Type UHC ([facility] primary, medicare advantage)    PT Start Time 0932    PT Stop Time 1025    PT Time Calculation (min) 53 min    Equipment Utilized During Treatment Gait belt    Activity Tolerance Patient tolerated treatment well    Behavior During Therapy [facility] for tasks assessed/performed   tearful intermittently when discussing changes in funciton but otherwise [facility]            Past Medical History:  Diagnosis Date   Arthritis    RA   ASCUS of cervix with negative high risk HPV 10/2017   High risk HPV infection 03/2012   HSV (herpes simplex virus) anogenital infection 12/2016   LGSIL (low grade squamous intraepithelial dysplasia) 2013/2017   03/2012 positive high risk HPV,  09/2015 with negative high-risk HPV   Rheumatoid arthritis(714.0)    Spinal stenosis    Past Surgical History:  Procedure Laterality Date   CERVICAL SPINE SURGERY     08/1992 x2 surgeries 1 week apart.   feet reconstruction surgery     bilateral    I & D KNEE WITH POLY EXCHANGE Left 04/11/2018   Procedure: REVISION LEFT TOTAL KNEE- POLY EXCHANGE;  Surgeon: Adonica Hoose, MD;  Location: [facility] OR;  Service: Orthopedics;  Laterality: Left;  Needs RNFA   LUMBAR FUSION  02/2023   TONSILLECTOMY  age 63   TOTAL HIP ARTHROPLASTY Right 12/2020   TOTAL HIP ARTHROPLASTY Left 12/2022   TOTAL KNEE ARTHROPLASTY Left 03/22/2017   Procedure: TOTAL KNEE ARTHROPLASTY w/ Navigation;  Surgeon: Adonica Hoose, MD;  Location: [facility] OR;  Service: Orthopedics;  Laterality: Left;   TOTAL KNEE ARTHROPLASTY Right  05/10/2017   Procedure: TOTAL KNEE ARTHROPLASTY;  Surgeon: Adonica Hoose, MD;  Location: [facility] OR;  Service: Orthopedics;  Laterality: Right;   TOTAL KNEE REVISION Left 07/2020   level 3   Patient Active Problem List   Diagnosis Date Noted   Cervical stenosis of spinal canal 10/01/2023   Lumbar stenosis with neurogenic claudication 10/01/2023   Failed total knee, left, initial encounter (HCC) 04/11/2018   Degenerative arthritis of right knee 05/10/2017   Degenerative joint disease of right knee 05/10/2017   Rheumatoid arthritis (HCC)     ONSET DATE: 02/01/2024 (referral date)  REFERRING DIAG: S/P cervical spinal fusion   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 Patient reports she is using it most of the time other than using the cane in house. Patient reports she has started using railings though in the house and this is also making her feel more stable. Denies falls and near falls.   Pt accompanied by: self and spouse in lobby - [name]  PERTINENT HISTORY: RA, s/p fusion and laminectomy from C1-T3 and a muscle overlay in the back on 01/24/2024, numerous surgeries related to RA  PAIN:  Are you having pain? No - Reports none right now but reports well controlled on oxycodone    PRECAUTIONS: Cervical and Fall (RA - will need lumbar surgery and RA ulnar drift in UE digits)  RED FLAGS: Recommend follow up with medical team regarding swallow   WEIGHT BEARING RESTRICTIONS: Yes no lifting over 10 lb  FALLS: Has patient fallen in last  6 months? Yes. Number of falls 1- reports tripping over area rug and needed help to get up, was not using cane at the time   LIVING ENVIRONMENT: Lives with: lives with their spouse - adult daughter that lives at home, "high functioning autistic" per patient Lives in: House/apartment Stairs: Yes: External: 3 steps; bilateral but cannot reach both - uses cane in other arm, lives on first floor Has following equipment at home: Single point cane, Environmental consultant - 2 wheeled, Tour manager, and Grab bars  PLOF:  Modified independent but needed help with getting shoes on intermittently, getting lower close on  PATIENT GOALS: "I feel like my lumbar gets in the way - but would love to be able to go to grocery store again and would love to get back to driving. Get of the oxycodine and be able to turn my head and look so I can drive."  OBJECTIVE:  Note: Objective measures were completed at Evaluation unless otherwise noted.  DIAGNOSTIC FINDINGS:   COMPARISON: 01/24/2024 x-rays.  VIEWS: AP and lateral Impression:   1. No acute fracture. 2. Surgical changes of posterior instrumented fusion spanning C1-T3. Hardware in similar alignment to prior without interval complications. 3. Prior C7-T1 ACDF. No interval hardware complications. 4. Surgical drain in place.   CT 10/11/2023: IMPRESSION: 1. Prior C7-T1 ACDF with progressive back out of the right-sided screw. Interval healing of the T1 vertebral body fracture. 2. Unchanged anterior subluxation of C1 on C2 with moderate spinal stenosis. 3. Likely moderate spinal stenosis and moderate neural foraminal stenosis at C3-4. 4. Likely moderate spinal stenosis and moderate to severe right neural foraminal stenosis at C4-5. 5. Moderate right and mild left neural foraminal stenosis at C7-T1.  COGNITION: Overall cognitive status: Within functional limits for tasks assessed                                           TREATMENT:    Self Care:  Vitals:   02/17/24  0955  BP: 123/81  Pulse: 79   Vitals assessed as noted above, mildly elevated but [facility] for therapy Patient brought up return to driving, PT explains that cannot medically clear patient and will need to come from physician, PT did discuss driving courses available in area and adaptive equipment resources that may be helpful if patient needs in future, patient tearful during this conversation as driving is primary mode of independence and PT recommends continuing the conversation with medical team to maximize safety and independence Discussed  SLP referral, PT explains has not heard back from PCP but will reach out to rheumatology too, PT also discusses podiatry referral for footwear and 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, patient reports having one in the past and they had recommend surgery but would be open to new referral for second opinion   Gait:  Findings: Gait Characteristics: valgus, step through pattern, decreased hip/knee flexion- Right, decreased hip/knee flexion- Left, decreased ankle dorsiflexion- Right, decreased ankle dorsiflexion- Left, trendelenburg, lateral lean- Right, and wide BOS,  Distance walked: ~2 x 200 feet Assistive device utilized:2WW  Level of assistance: Mod, trendelenburg not corrected with walker but does provide improved support and pain reduction   Patient discusses AD recommendations with PT, PT recommends 2WW at this time for pain management and increases stability given degree of trendelenburg and instability; patient verbalized understanding   Provided HEP walking program 4x week with 10 minute walks (target goal)   NMR:    [facility] PT Assessment - 02/17/24 0001       Standardized Balance Assessment   Standardized Balance Assessment Berg Balance Test      Berg Balance Test   Sit to Stand Able to stand without using hands and stabilize independently    Standing Unsupported Able to stand safely 2 minutes    Sitting with Back Unsupported but Feet  Supported on Floor or Stool Able to sit safely and securely 2 minutes    Stand to Sit Sits safely with minimal use of hands    Transfers Able to transfer safely, minor use of hands    Standing Unsupported with Eyes Closed Able to stand 10 seconds with supervision    Standing Unsupported with Feet Together Able to place feet together independently and stand 1 minute safely    From Standing, Reach Forward with Outstretched Arm Can reach confidently >25 cm (10")   L arm only due to R arm restricitons   From Standing Position, Pick up Object from Floor Able to pick up shoe, needs supervision    From Standing Position, Turn to Look Behind Over each Shoulder Looks behind one side only/other side shows less weight shift   less weight shift to the R   Turn 360 Degrees Able to turn 360 degrees safely one side only in 4 seconds or less    Standing Unsupported, Alternately Place Feet on Step/Stool Able to complete 4 steps without aid or supervision   4 steps with supervision and then requires seated break due to back pain   Standing Unsupported, One Foot in Front Able to take small step independently and hold 30 seconds   smaller step forward with L   Standing on One Leg Tries to lift leg/unable to hold 3 seconds but remains standing independently   limited by back pain when standing on R leg   Total Score 45    Berg comment: 45/56 = increased risk for falls            Modified rotation without head turn due to cervical precautions  Semitandem to tandem balance in corner for HEP (see below for details): discussed how to progress at home   PATIENT EDUCATION: Education details: Randye Buttner results, initial HEP, see self care session above  Person educated: Patient Education method: Explanation Education comprehension: verbalized understanding and needs further education  HOME EXERCISE PROGRAM: Access Code: [email or web address] URL: [email or web address] Date: 02/17/2024 Prepared by: Camella Cave  Exercises - Tandem Stance  - 1 x daily - 7 x weekly -  3 sets - 30 seconds  hold  HEP walking program 4x week for 10 minutes (as symptoms allow)   GOALS: Goals reviewed with patient? Yes  SHORT TERM GOALS: Target date: 03/02/2024  Patient will report demonstrate independence with intial HEP in order to maintain current gains and continue to progress after physical therapy discharge.   Baseline: To be provided  Goal status: INITIAL  2.  Berg to be assessed / LTG written Baseline:  Goal status: INITIAL  LONG TERM GOALS: Target date: 04/06/2024  Patient will report demonstrate independence with final HEP in order to maintain current gains and continue to progress after physical therapy discharge.   Baseline: To be provided  Goal status: INITIAL  2.  Patient will improve gait speed to 0.6 m/s with LRAD to indicate improvement to the level of community ambulator in order to participate more easily in activities outside of the home.   Baseline: 0.49 m/s with SPC (SBA) Goal status: INITIAL  3.  Patient will improve their 5x Sit to Stand score to less than 15 seconds with least amount of UE use as needed to demonstrate a decreased risk for falls and improved LE strength.   Baseline: 18.9 seconds with UE use  Goal status: INITIAL  4.  Patient will improve NDI score to 47% impairment indicate a clinically important improvement in neck pain.   Baseline: 62% impairment Goal status: INITIAL  5.  Berg to be assessed / LTG written Baseline: To be assessed  Goal status:D/C LTG as limitor more pain than balance  ASSESSMENT:  CLINICAL IMPRESSION: Skilled physical therapy session emphasized education on safety with AD, return to driving protocol, assessment of Balance via berg, and creation of initial HEP. Berg goal not written as patient primarily limited by pain than balance. Recommend podiatry for follow up about valgus toe deformity and SLP for speech changes since extubation.  Introduced Engineer, agricultural for balance and walking program. Continue POC as able.   OBJECTIVE IMPAIRMENTS: Abnormal gait, decreased balance, decreased endurance, difficulty walking, decreased ROM, decreased strength, and pain.   ACTIVITY LIMITATIONS: carrying, standing, squatting, reach over head, and locomotion level  PARTICIPATION LIMITATIONS: meal prep, cleaning, community activity, and yard work  PERSONAL FACTORS: Age, Time since onset of injury/illness/exacerbation, and 1-2 comorbidities: see above  are also affecting patient's functional outcome.   REHAB POTENTIAL: Fair limited by need for additional lumbar surgery, RA  CLINICAL DECISION MAKING: Evolving/moderate complexity  EVALUATION COMPLEXITY: Moderate  PLAN:  PT FREQUENCY: 1x/week  PT DURATION: 6 weeks  PLANNED INTERVENTIONS: 97164- PT Re-evaluation, 97110-Therapeutic exercises, 97530- Therapeutic activity, 97112- Neuromuscular re-education, 97535- Self Care, 16109- Manual therapy, [id number]- Gait training, [phone]- Aquatic Therapy, and Dry Needling  PLAN FOR NEXT SESSION:  did patient follow up with doctor about swallowing, SLP referral received?   - training with cane as appropriate  - cervical precautions review next session  - update HEP with balance and gentle strength as tolerated  Coreen Devoid, PT, DPT 02/17/2024, 11:46 AM

## 2024-02-17 NOTE — Therapy (Signed)
 OUTPATIENT OCCUPATIONAL THERAPY NEURO TREATMENT  Patient Name: Caitlin Wagner MRN: [id number] DOB:1966/04/17, 58 y.o., female Today's Date: 02/17/2024  PCP: Dr. Luster Salters (rheumatologist), Stamey, Lowell Rude, FNP (PCP)  REFERRING PROVIDER: Staci Dykes, MD   END OF SESSION:  OT End of Session - 02/17/24 1107     Visit Number 2    Date for OT Re-Evaluation 04/07/24    Authorization Type [employer] Medicare    OT Start Time 1105    OT Stop Time 1145    OT Time Calculation (min) 40 min    Activity Tolerance Patient tolerated treatment well    Behavior During Therapy [facility] for tasks assessed/performed             Past Medical History:  Diagnosis Date   Arthritis    RA   ASCUS of cervix with negative high risk HPV 10/2017   High risk HPV infection 03/2012   HSV (herpes simplex virus) anogenital infection 12/2016   LGSIL (low grade squamous intraepithelial dysplasia) 2013/2017   03/2012 positive high risk HPV,  09/2015 with negative high-risk HPV   Rheumatoid arthritis(714.0)    Spinal stenosis    Past Surgical History:  Procedure Laterality Date   CERVICAL SPINE SURGERY     08/1992 x2 surgeries 1 week apart.   feet reconstruction surgery     bilateral    I & D KNEE WITH POLY EXCHANGE Left 04/11/2018   Procedure: REVISION LEFT TOTAL KNEE- POLY EXCHANGE;  Surgeon: Adonica Hoose, MD;  Location: [facility] OR;  Service: Orthopedics;  Laterality: Left;  Needs RNFA   LUMBAR FUSION  02/2023   TONSILLECTOMY  age 24   TOTAL HIP ARTHROPLASTY Right 12/2020   TOTAL HIP ARTHROPLASTY Left 12/2022   TOTAL KNEE ARTHROPLASTY Left 03/22/2017   Procedure: TOTAL KNEE ARTHROPLASTY w/ Navigation;  Surgeon: Adonica Hoose, MD;  Location: [facility] OR;  Service: Orthopedics;  Laterality: Left;   TOTAL KNEE ARTHROPLASTY Right 05/10/2017   Procedure: TOTAL KNEE ARTHROPLASTY;  Surgeon: Adonica Hoose, MD;  Location: [facility] OR;  Service: Orthopedics;  Laterality: Right;   TOTAL KNEE REVISION Left 07/2020    level 3   Patient Active Problem List   Diagnosis Date Noted   Cervical stenosis of spinal canal 10/01/2023   Lumbar stenosis with neurogenic claudication 10/01/2023   Failed total knee, left, initial encounter (HCC) 04/11/2018   Degenerative arthritis of right knee 05/10/2017   Degenerative joint disease of right knee 05/10/2017   Rheumatoid arthritis (HCC)     ONSET DATE: 02/01/2024 (referral date)   REFERRING DIAG: S/P cervical spinal fusion   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  Pt accompanied by: self - husband drove her  PERTINENT HISTORY: RA, s/p fusion and laminectomy from C1-T3 and a muscle overlay in the back on 01/24/2024, numerous surgeries related to RA   PRECAUTIONS: Cervical and Fall (RA - will need lumbar surgery and RA ulnar drift in UE digits)   WEIGHT BEARING RESTRICTIONS: Yes no lifting over 10 lb   PAIN:  Are you having pain? Yes: NPRS scale: 0/10 Pain location: low back  FALLS:  Has patient fallen in last 6 months? Yes. 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  HAND DOMINANCE: Right  ADLs:  Eating: leans into UE to feed self Grooming: husband puts hair up and brushes hair (pt afraid she will hit her incision with a brush); generally mod I UB Dressing: max to total A LB Dressing: max A Toileting: mod I Bathing: mod A Tub Shower transfers: min A  IADLs: Shopping: online - husband picks up Light housekeeping: has paid cleaner that comes in 1 x month Meal Prep: dependent Community mobility: dependent Medication management: mod I Handwriting: no recent changes  MOBILITY STATUS: Independent with SPC  ACTIVITY TOLERANCE: Activity tolerance: fair  FUNCTIONAL OUTCOME MEASURES: 02/17/24: PSFS = 2.3  UPPER EXTREMITY ROM:     AROM Right (eval) Left (eval)  Shoulder flexion 45* WFL  Shoulder abduction 45* WFL  Elbow flexion [facility] WFL  Elbow extension [facility] WFL  Wrist flexion [facility] WFL  Wrist extension WFL WFL  Forearm pronation WFL WFL  Forearm supination 60* WFL  Digit Composite Flexion [facility] WFL with exception to thumb (goes to middle of palm)   Digit Composite Extension Lacks extension at MCP (mild hyperextension at 4th and 5th PIPs) Lacks extension at MCP (hyperextension at 4th and 5th PIPs)  Digit Opposition To 2nd and 3rd digits only To 2nd digit only  (Blank rows = not tested)  UPPER EXTREMITY MMT:     BUE: BFL  HAND FUNCTION: 02/17/24 Grip strength: Right: 12.7, 15.6  lbs; Left: 11.2, 13.0 lbs Average Right: 14.2 lbs Left 12.1 lbs  COORDINATION: 02/17/24 9 Hole Peg test: Right: 29.06 sec; Left: 28.97 sec  SENSATION: WFL  EDEMA: Edema with RA, better controlled with current medications  MUSCLE TONE: BUE - slight  hypotonicity in hands  COGNITION: Overall cognitive status: Within functional limits for tasks assessed  VISION: Subjective report: no changes Baseline vision: Wears glasses all the time  VISION ASSESSMENT: WFL  PERCEPTION: Not tested  PRAXIS: Not tested  OBSERVATIONS: Pt appears well-kept. Uses SPC for slow, altered gait though no LOB. Ulnar drifting of B hands with limited MCP extension.  TODAY'S TREATMENT :    Therapeutic Activities: Pt conducted 9 hole peg test and manual grip strength testing - see results above in objective data.  Also reviewed specific areas of interest for increased independence at home per Patient Specific Functional Scale with areas identified for OT as: -Cooking -Cutting food -Hobbies (knit/crochet) -Don brassiere  Self Care:   OT educated patient on sleep positioning as noted in patient instructions to reduce stress to upper extremity joints, which could be attributing to reported pain and deformities in B hands. Patient verbalized understanding. Handout provided.  Pt is off her medication which was preventing her from driving and she was interested in recommendations.  Info shared (as listed in pt instructions) re: increasing driving from parking lot to neighborhood to busier streets to interstate/nighttime driving with supervision of licensed driver.  Due to severe ulnar drift of B MCP joint, limited grip strength and limited neck ROM s/p surgery, she is encouraged to increase slowly with supervision in case she needed help, rest or felt uncomfortable.   Pt also made aware that there are AE considerations to help with driving and resources could be sought/provided for her.   Began introduction to AE and splinting as pt would benefit from splints for digital alignment and AE for increased joint protection and independence with  activities. - Suggested trial of fitness glove with fingers open to help with MCP/digital alignment as this is a low tech, inexpensive option to begin trials with - Ulnar drift splint/resting hand splint options shown - Right angle knife shown via online search   PATIENT EDUCATION: Education details: Sleep positions, return to driving Person educated: Patient Education method: Explanation, Demonstration, Tactile cues, Verbal cues, and Handouts Education comprehension: verbalized understanding, verbal cues required, tactile cues required, and needs further education  HOME EXERCISE PROGRAM: 02/17/24: Sleep positions/Return to driving   GOALS:  SHORT TERM GOALS: Target date: 03/10/2024  Pt to be independent with B splint wear and care. Baseline: New to outpt OT Goal status: IN Progress  2.  Pt will independently recall at least 3 joint protection, ergonomics, and body mechanic principles as noted in pt instructions.   Baseline: New to outpt OT Goal status: IN Progress  LONG TERM GOALS: Target date: 04/07/2024    Patient will demonstrate updated BUE HEP with visual handouts only for proper execution. Baseline: New to outpt OT Goal status: INITIAL  2.  Patient will have adequate hand strength and coordination to use modified techniques and adaptive equipment for hobbies, cooking and self care AND no loss of strength or coordination via standardized tests.  Baseline: Grip Right: 14.2 lbs Left 12.1 lbs AND 9 Hole Peg test: Right: 29.06 sec; Left: 28.97 sec Goal status: IN Progress  3.  Patient will report at least two-point increase in average PSFS score or at least three-point increase in a single activity score indicating functionally significant improvement given minimum detectable change. Baseline: 2.3 total score (See above for individual activity scores.  Goal status: IN Progress  ASSESSMENT:  CLINICAL IMPRESSION: Patient is a 58 y.o. female who was seen today for occupational  therapy treatment s/p cervical spinal fusion and h/o RA with significant ulnar deviation. Pt responded well to initial education today re: options to explore in OT for joint alignment, protection and functional use of UEs as she has UE weakness and issues with dexterity due to alignment of digits.  Pt will benefit from continued skilled OT services in the outpatient setting to work on impairments as  noted at evaluation to help pt improve independence and safety with ADL and IADL completion. Pt would also benefit from fabricated/prefab hand splints to improve positioning of B digits to prevent contractures and pain given impaired ROM and ulnar drift positioning.   PERFORMANCE DEFICITS: in functional skills including ADLs, IADLs, coordination, ROM, strength, pain, Fine motor control, Gross motor control, mobility, endurance, decreased knowledge of precautions, decreased knowledge of use of DME, and UE functional use.   IMPAIRMENTS: are limiting patient from ADLs, IADLs, rest and sleep, and leisure.   CO-MORBIDITIES: has co-morbidities such as RA and pending spinal surgery  that affects occupational performance. Patient will benefit from skilled OT to address above impairments and improve overall function.  REHAB POTENTIAL: Fair given chronicity and extent of BUE impairments  PLAN:  OT FREQUENCY: 2x/week  OT DURATION: 8 weeks  PLANNED INTERVENTIONS: 97168 OT Re-evaluation, 97535 self care/ADL training, 60454 therapeutic exercise, 97530 therapeutic activity, 97112 neuromuscular re-education, 97140 manual therapy, 97113 aquatic therapy, 97035 ultrasound, 97018 paraffin, 09811 fluidotherapy, 97010 moist heat, 97032 electrical stimulation (manual), 97760 Orthotic Initial, 97763 Orthotic/Prosthetic subsequent, passive range of motion, functional mobility training, energy conservation, coping strategies training, patient/family education, and DME and/or AE instructions  RECOMMENDED OTHER SERVICES: N/A for  this visit  CONSULTED AND AGREED WITH PLAN OF CARE: Patient  PLAN FOR NEXT SESSION:  Check on hand splints through [employer] protection AE education - [email or web address] Tendon glides in splint or glove (improved MCP alignment first) Dowel HEP supine;  Paraffin trials as needed  Zora Hires, OT 02/17/2024, 3:42 PM

## 2024-02-17 NOTE — Telephone Encounter (Addendum)
 Physical therapist called Dr. Dorenda Gandy office in regards to referrals for SLP and podiatry and LVM.  Camella Cave, PT, DPT  Contact number for office: [phone]

## 2024-02-21 ENCOUNTER — Ambulatory Visit: Admitting: Occupational Therapy

## 2024-02-21 DIAGNOSIS — M0579 Rheumatoid arthritis with rheumatoid factor of multiple sites without organ or systems involvement: Secondary | ICD-10-CM

## 2024-02-21 DIAGNOSIS — M6281 Muscle weakness (generalized): Secondary | ICD-10-CM

## 2024-02-21 DIAGNOSIS — M6249 Contracture of muscle, multiple sites: Secondary | ICD-10-CM

## 2024-02-21 DIAGNOSIS — R278 Other lack of coordination: Secondary | ICD-10-CM

## 2024-02-21 NOTE — Therapy (Signed)
 OUTPATIENT OCCUPATIONAL THERAPY NEURO TREATMENT  Patient Name: Caitlin Wagner MRN: [id number] DOB:1966-01-06, 58 y.o., female Today's Date: 02/21/2024  PCP: Dr. Luster Salters (rheumatologist), Stamey, Lowell Rude, FNP (PCP)  REFERRING PROVIDER: Staci Dykes, MD   END OF SESSION:  OT End of Session - 02/21/24 1406     Visit Number 3    Date for OT Re-Evaluation 04/07/24    Authorization Type [employer] Medicare    OT Start Time 1404    OT Stop Time 1446    OT Time Calculation (min) 42 min    Activity Tolerance Patient tolerated treatment well    Behavior During Therapy [facility] for tasks assessed/performed             Past Medical History:  Diagnosis Date   Arthritis    RA   ASCUS of cervix with negative high risk HPV 10/2017   High risk HPV infection 03/2012   HSV (herpes simplex virus) anogenital infection 12/2016   LGSIL (low grade squamous intraepithelial dysplasia) 2013/2017   03/2012 positive high risk HPV,  09/2015 with negative high-risk HPV   Rheumatoid arthritis(714.0)    Spinal stenosis    Past Surgical History:  Procedure Laterality Date   CERVICAL SPINE SURGERY     08/1992 x2 surgeries 1 week apart.   feet reconstruction surgery     bilateral    I & D KNEE WITH POLY EXCHANGE Left 04/11/2018   Procedure: REVISION LEFT TOTAL KNEE- POLY EXCHANGE;  Surgeon: Adonica Hoose, MD;  Location: [facility] OR;  Service: Orthopedics;  Laterality: Left;  Needs RNFA   LUMBAR FUSION  02/2023   TONSILLECTOMY  age 14   TOTAL HIP ARTHROPLASTY Right 12/2020   TOTAL HIP ARTHROPLASTY Left 12/2022   TOTAL KNEE ARTHROPLASTY Left 03/22/2017   Procedure: TOTAL KNEE ARTHROPLASTY w/ Navigation;  Surgeon: Adonica Hoose, MD;  Location: [facility] OR;  Service: Orthopedics;  Laterality: Left;   TOTAL KNEE ARTHROPLASTY Right 05/10/2017   Procedure: TOTAL KNEE ARTHROPLASTY;  Surgeon: Adonica Hoose, MD;  Location: [facility] OR;  Service: Orthopedics;  Laterality: Right;   TOTAL KNEE REVISION Left 07/2020    level 3   Patient Active Problem List   Diagnosis Date Noted   Cervical stenosis of spinal canal 10/01/2023   Lumbar stenosis with neurogenic claudication 10/01/2023   Failed total knee, left, initial encounter (HCC) 04/11/2018   Degenerative arthritis of right knee 05/10/2017   Degenerative joint disease of right knee 05/10/2017   Rheumatoid arthritis (HCC)     ONSET DATE: 02/01/2024 (referral date)   REFERRING DIAG: S/P cervical spinal fusion   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   Pt accompanied by: self - husband drove her  PERTINENT HISTORY: RA, s/p fusion and laminectomy from C1-T3 and a muscle overlay in the back on 01/24/2024, numerous surgeries related to RA   PRECAUTIONS: Cervical and Fall (RA - will need lumbar surgery and RA ulnar drift in UE digits)   WEIGHT BEARING RESTRICTIONS: Yes no lifting over 10 lb   PAIN:  Are you having pain? Yes: NPRS scale: 7/10 Pain location: neck  FALLS: Has  patient fallen in last 6 months? Yes. 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  HAND DOMINANCE: Right  ADLs:  Eating: leans into UE to feed self Grooming: husband puts hair up and brushes hair (pt afraid she will hit her incision with a brush); generally mod I UB Dressing: max to total A LB Dressing: max A Toileting: mod I Bathing: mod A Tub Shower transfers: min A  IADLs: Shopping: online - husband picks up Light housekeeping: has paid cleaner that comes in 1 x month Meal Prep: dependent Community mobility: dependent Medication management: mod I Handwriting: no recent changes  MOBILITY STATUS: Independent with SPC  ACTIVITY TOLERANCE: Activity tolerance: fair  FUNCTIONAL OUTCOME MEASURES: 02/17/24: PSFS = 2.3  UPPER EXTREMITY ROM:     AROM Right (eval) Left (eval)  Shoulder flexion 45* WFL  Shoulder abduction 45* WFL  Elbow flexion [facility] WFL  Elbow extension [facility] WFL  Wrist flexion [facility] WFL  Wrist extension WFL WFL  Forearm pronation WFL WFL  Forearm supination 60* WFL  Digit Composite Flexion [facility] WFL with exception to thumb (goes to middle of palm)   Digit Composite Extension Lacks extension at MCP (mild hyperextension at 4th and 5th PIPs) Lacks extension at MCP (hyperextension at 4th and 5th PIPs)  Digit Opposition To 2nd and 3rd digits only To 2nd digit only  (Blank rows = not tested)  UPPER EXTREMITY MMT:     BUE: BFL  HAND FUNCTION: 02/17/24 Grip strength: Right: 12.7, 15.6  lbs; Left: 11.2, 13.0 lbs Average Right: 14.2 lbs Left 12.1 lbs  COORDINATION: 02/17/24 9 Hole Peg test: Right: 29.06 sec; Left: 28.97 sec  SENSATION: WFL  EDEMA: Edema with RA, better controlled with current medications  MUSCLE TONE: BUE - slight  hypotonicity in hands  COGNITION: Overall cognitive status: Within functional limits for tasks assessed  VISION: Subjective report: no changes Baseline vision: Wears glasses all the time  VISION ASSESSMENT: WFL  PERCEPTION: Not tested  PRAXIS: Not tested  OBSERVATIONS: Pt appears well-kept. Uses SPC for slow, altered gait though no LOB. Ulnar drifting of B hands with limited MCP extension.  TODAY'S TREATMENT :    OT reviewed knife cutting options including right angle and rocker knives. Pt trialed rocker knife in clinic using putty to cut through.   OT educated pt on joint protection, ergonomics, and Optometrist principles as noted in pt instructions as needed to improve UE pain. Will require review to cover all handouts at next session.   Rep. from [employer] present to discuss options to help correct positioning of B digits to reduce ulnar drift and maintain joint integrity.    PATIENT EDUCATION: Education details: adaptive strategies/devices; joint protection Person educated: Patient Education method: Explanation, Demonstration, Tactile cues, Verbal cues, and Handouts Education comprehension: verbalized understanding, verbal cues required, tactile cues required, and needs further education  HOME EXERCISE PROGRAM: 02/17/24: Sleep positions/Return to driving  6/0/4540:  joint protection   GOALS:  SHORT TERM GOALS: Target date: 03/10/2024  Pt to be independent with B splint wear and care. Baseline: New to outpt OT Goal status: IN Progress  2.  Pt will independently recall at least 3 joint protection, ergonomics, and body mechanic principles as noted in pt instructions.   Baseline: New to outpt OT Goal status: IN Progress  LONG TERM GOALS: Target date: 04/07/2024    Patient will demonstrate updated BUE HEP with visual handouts only for proper  execution. Baseline: New to outpt OT Goal status: INITIAL  2.  Patient will have adequate hand strength and coordination to use modified techniques and adaptive equipment for hobbies, cooking and self care AND no loss of strength or coordination via standardized tests.  Baseline: Grip Right: 14.2 lbs Left 12.1 lbs AND 9 Hole Peg test: Right: 29.06 sec; Left: 28.97 sec Goal status: IN Progress  3.  Patient will report at least two-point increase in average PSFS score or at least three-point increase in a single activity score indicating functionally significant improvement given minimum detectable change. Baseline: 2.3 total score (See above for individual activity scores.  Goal status: IN Progress  ASSESSMENT:  CLINICAL IMPRESSION: Patient verbalizes good understanding of modifications as discussed today. She will require review as needed to cover strategies with various occupations.    PERFORMANCE DEFICITS: in functional skills including ADLs, IADLs, coordination, ROM, strength, pain, Fine motor control, Gross motor control, mobility, endurance, decreased knowledge of precautions, decreased knowledge of use of DME, and UE functional use.   IMPAIRMENTS: are limiting patient from ADLs, IADLs, rest and sleep, and leisure.   CO-MORBIDITIES: has co-morbidities such as RA and pending spinal surgery that affects occupational performance. Patient will benefit from skilled OT to address above impairments and improve overall function.  REHAB POTENTIAL: Fair given chronicity and extent of BUE impairments  PLAN:  OT FREQUENCY: 2x/week  OT DURATION: 8 weeks  PLANNED INTERVENTIONS: 97168 OT Re-evaluation, 97535 self care/ADL training, 98119 therapeutic exercise, 97530 therapeutic activity, 97112 neuromuscular re-education, 97140 manual therapy, 97113 aquatic therapy, 97035 ultrasound, 97018 paraffin, 14782 fluidotherapy, 97010 moist heat, 97032 electrical stimulation (manual), 97760 Orthotic  Initial, 97763 Orthotic/Prosthetic subsequent, passive range of motion, functional mobility training, energy conservation, coping strategies training, patient/family education, and DME and/or AE instructions  RECOMMENDED OTHER SERVICES: N/A for this visit  CONSULTED AND AGREED WITH PLAN OF CARE: Patient  PLAN FOR NEXT SESSION:  Check on hand splints through [employer] protection - continue AE education - [email or web address]-[email or web address] Tendon glides in splint or glove (improved MCP alignment first) Dowel HEP supine;  Paraffin trials as needed  Altamease Asters, OT 02/21/2024, 3:49 PM

## 2024-02-21 NOTE — Patient Instructions (Signed)
 X03224 (8/07) - Page 1 of 6 [facility]  Rehabilitation and Sports Medicine Services Joint Protection Principles Therapist Phone  Joint protection principles are a series of techniques which can be included into all activities. This will reduce the stress on your joints. Joints that have been weakened by arthritis are at risk of being damaged by stress and strain. Improper use of diseased joints may lead to impaired function and deformity. Joint protection techniques are ways of doing activities so that the risk of deformity is decreased. 1. Respect For Pain Stop activities before you reach the point of discomfort or pain. Limit activities which cause your pain to last more than one hour after you have stopped the activity. 2. Balance Activity And Rest Rest before becoming tired. Plan rest periods during longer or more difficult activities. By resting 10 minutes during an activity, you will have more energy to continue. 3. Avoid Activities Which Cannot Be Stopped When you begin to feel joint pain, stop. This will eliminate excessive pain and fatigue later. Prioritize activities. Consider the activity, length of time, and difficulty before beginning. Plan difficult activities for "peak" energy times. 4. Use Larger, Stronger Joints For Activities, When Possible, Distributing The Weight Over Non-involved Or Stronger Joints.  To lift a bag from a counter, bend your knees, hug the bag with both arms. Bend your elbows so that the bag is held tightly to your chest and straighten your knees. Keep hold on the bag by keeping your elbows bent. If the load is too heavy, push shopping cart, or get help with groceries - use drive-up service.  You can use your hip to push open doors, and your feet to close lower drawers. OVER  X03224 (8/07) - Page 2 of 6  An envelope briefcase with a snap lock can be used rather than an attache case. By bending the elbows, the case can be carried under the arm so that  the case rests on the forearm. Hold the case by resting your arm against your body. Switch the case from one side of the body to the other.  Use the larger joints (elbow or shoulder) to carry the weight of the purse.  Wrong: The weight of the purse is all on the weak fingers.  Wing faucets: Keeping wrists extended, use the palm of your left hand to turn on a left faucet and the back of your left hand to turn the faucet off.  Four-pronged or circular faucets: Place palm of hand on top of the faucet keeping fingers extended. Straighten your elbow and apply a downward force on faucet, pushing from your shoulder. Keeping fingers and elbow extended, turn your arm inward toward your thumb. Right: The stronger elbow should carry the weight of the purse. CONTINUED ON NEXT PAGE X03224 (8/07) - Page 3 of 6  Wrong: Do not use fingers to lift heavy roasting pans or dishes.  Wrong: All the weight of the pot would on your weak fingers. 5. Avoid Staying In One Position For Extended Periods Of Time. Plan rest periods. Change your position. Stretch and relax your joints. 6. Maintain Or Use Your Joints In Good Alignment. Maintain proper posture.  This is good alignment. Right: Use oven mitts and lift with palms, using the stronger wrists and elbows to do the work. Right: Pick up the pot with two hands, using your palms. Avoid or change activities that cause your fingers to move towards the little finger side of your hand. OVER X03224 (8/07) -   Page 4 of 6  Use the palms of your hands for lifting and pushing. Push instead of pulling. 7. Maintain Proper Weight. Additional weight can stress weight-bearing joints (hip, knees, feet, back). Special Considerations For The Hands  1. AVOID TIGHT GRASP. Use a relaxed grip. Enlarge handles.  Place palm of hand on jar lid, and using weight if body, turn arm at shoulder to open jar. A sponge or wet towel under the jar prevents sliding.  2. AVOID PRESSURE ON BACK OF  KNUCKLES (MP JOINTS).  Wrong Right When rising from chair or bed. Dishwashing should be done with fingers kept straight as much as possible. It a dishwasher is available, use it in preference to washing by hand. Hold the knife or mixing spoon like a dagger, with the handle parallel to knuckles. Cutting is then changed from sawing to pulling. CONTINUED ON NEXT PAGE X03224 (8/07) - Page 5 of 6  3. USE BOTH HANDS WHEN POSSIBLE  4. AVOID REPETITIVE HAND ACTIVITIES Take breaks Change activity, i.e. using screwdriver, crocheting.  5. AVOID PRESSURE TO TIP OF THUMB Example: pushing snaps together, opening car doors, ringing doorbells.  To protect thumb joints, open milk containers with heels of the hands rather than thumbs. PostureWhether walking, standing, sitting or even sleeping, good posture is important for people with arthritis. Poor posture can make arthritis worse. As for standing, you should stand straight, head high, shoulders back, stomach in, and hips and knees straight. WalkingWalk erect, as in standing position. Arm swing freely at sides; let your weight shift easily from side to the other. Don't carry heavy packages in one hand. A lightweight shoulder bag is a good idea. If legs or knees are involved, a cane will make walking easier. Ring top cans: Hold the can with one hand. With the other hand, place a knife through the ring with handle of knife directly over the opening. Using the palm of your hand, push down on the handle of the knife. OVER X03224 (8/07) - Page 6 of 6  Resting/SleepingPatients with rheumatoid arthritis should avoid bent knees or arms. Lie straight at sides, knees and hips straight. Use a firm mattress or put plywood board between mattress and bedspring. If you need a pillow under your head, use a thin one. Keep sheets and blankets loose over your feet, perhaps by using a blanket support. If your arthritis is in your back, you may need a different position for sleeping.  Ask your doctor. SittingKeep good posture when sitting down. Use straight-back armchairs with firm seats. Sit with head up, shoulders back, stomach in, feet flat on floor. Use arms of chair to stand up slowly. References AOTA's, Workbook for Consumers with R.A.   Cordery, Joy Cumberland, M.A.O.T., OTR, "Joint Protection - A Responsibility for the Occupational Therapist," American Journal of Occupational Therapy, XIX, %, 1965.   "Joint Protection," Occupational Therapy Department, [facility], [place], [place].   Melvin, Jeanne: Rheumatic Disease Occupational Therapy and Rehab, 1977.   "Principles of Joint Protection," Occupational Therapy Department, [facility], [place], [place].   [employer], 1978.   Watkins, RuthAnn, OTR, and Robinson, Dianne, OTR, "Joint Preservation Techniques for Patients with Rheumatoid Arthritis," Department of Occupational Therapy, [facility] [place], [place], [place]. 

## 2024-02-23 ENCOUNTER — Ambulatory Visit: Admitting: Occupational Therapy

## 2024-02-23 ENCOUNTER — Ambulatory Visit: Admitting: Physical Therapy

## 2024-02-23 ENCOUNTER — Encounter: Payer: Self-pay | Admitting: Physical Therapy

## 2024-02-23 VITALS — BP 126/87 | HR 82

## 2024-02-23 DIAGNOSIS — M6281 Muscle weakness (generalized): Secondary | ICD-10-CM

## 2024-02-23 DIAGNOSIS — M5459 Other low back pain: Secondary | ICD-10-CM

## 2024-02-23 DIAGNOSIS — M6249 Contracture of muscle, multiple sites: Secondary | ICD-10-CM

## 2024-02-23 DIAGNOSIS — R278 Other lack of coordination: Secondary | ICD-10-CM

## 2024-02-23 DIAGNOSIS — M542 Cervicalgia: Secondary | ICD-10-CM

## 2024-02-23 DIAGNOSIS — R262 Difficulty in walking, not elsewhere classified: Secondary | ICD-10-CM

## 2024-02-23 DIAGNOSIS — M0579 Rheumatoid arthritis with rheumatoid factor of multiple sites without organ or systems involvement: Secondary | ICD-10-CM

## 2024-02-23 NOTE — Therapy (Unsigned)
 OUTPATIENT PHYSICAL THERAPY NEURO TREATMENT   Patient Name: Caitlin Wagner MRN: [id number] DOB:Mar 09, 1966, 58 y.o., female Today's Date: 02/24/2024   PCP: Dr. Luster Salters (rheumatologist), Stamey, Lowell Rude, FNP (PCP) REFERRING PROVIDER: Staci Dykes, MD  END OF SESSION:  PT End of Session - 02/23/24 1619     Visit Number 3    Number of Visits 7    Date for PT Re-Evaluation 04/06/24    Authorization Type [employer] ([facility] primary, medicare advantage)    PT Start Time 1618    PT Stop Time 1656    PT Time Calculation (min) 38 min    Equipment Utilized During Treatment Gait belt    Activity Tolerance Patient tolerated treatment well    Behavior During Therapy WFL for tasks assessed/performed             Past Medical History:  Diagnosis Date   Arthritis    RA   ASCUS of cervix with negative high risk HPV 10/2017   High risk HPV infection 03/2012   HSV (herpes simplex virus) anogenital infection 12/2016   LGSIL (low grade squamous intraepithelial dysplasia) 2013/2017   03/2012 positive high risk HPV,  09/2015 with negative high-risk HPV   Rheumatoid arthritis(714.0)    Spinal stenosis    Past Surgical History:  Procedure Laterality Date   CERVICAL SPINE SURGERY     08/1992 x2 surgeries 1 week apart.   feet reconstruction surgery     bilateral    I & D KNEE WITH POLY EXCHANGE Left 04/11/2018   Procedure: REVISION LEFT TOTAL KNEE- POLY EXCHANGE;  Surgeon: Adonica Hoose, MD;  Location: [facility] OR;  Service: Orthopedics;  Laterality: Left;  Needs RNFA   LUMBAR FUSION  02/2023   TONSILLECTOMY  age 58   TOTAL HIP ARTHROPLASTY Right 12/2020   TOTAL HIP ARTHROPLASTY Left 12/2022   TOTAL KNEE ARTHROPLASTY Left 03/22/2017   Procedure: TOTAL KNEE ARTHROPLASTY w/ Navigation;  Surgeon: Adonica Hoose, MD;  Location: [facility] OR;  Service: Orthopedics;  Laterality: Left;   TOTAL KNEE ARTHROPLASTY Right 05/10/2017   Procedure: TOTAL KNEE ARTHROPLASTY;  Surgeon: Adonica Hoose, MD;   Location: [facility] OR;  Service: Orthopedics;  Laterality: Right;   TOTAL KNEE REVISION Left 07/2020   level 3   Patient Active Problem List   Diagnosis Date Noted   Cervical stenosis of spinal canal 10/01/2023   Lumbar stenosis with neurogenic claudication 10/01/2023   Failed total knee, left, initial encounter (HCC) 04/11/2018   Degenerative arthritis of right knee 05/10/2017   Degenerative joint disease of right knee 05/10/2017   Rheumatoid arthritis (HCC)     ONSET DATE: 02/01/2024 (referral date)  REFERRING DIAG: S/P cervical spinal fusion   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 Patient was able to do a full loop around her neighborhood in 10 minutes. She reports that her pain has been better the more she is moving. Denies falls and near falls.   Pt accompanied by: self and spouse in lobby - [name]  PERTINENT HISTORY: RA, s/p fusion and laminectomy from C1-T3 and a muscle overlay in the back on 01/24/2024, numerous surgeries related to RA  PAIN:  Are you having pain? No - Reports none right now but reports well controlled on oxycodone    PRECAUTIONS: Cervical and Fall (RA - will need lumbar surgery and RA ulnar drift in UE digits)  RED FLAGS: Recommend follow up with medical team regarding swallow   WEIGHT BEARING RESTRICTIONS: Yes no lifting over 10 lb  FALLS: Has patient fallen in last 6 months? Yes. Number of falls 1- reports tripping over area rug and needed help to get up, was not using cane at the time   LIVING  ENVIRONMENT: Lives with: lives with their spouse - adult daughter that lives at home, "high functioning autistic" per patient Lives in: House/apartment Stairs: Yes: External: 3 steps; bilateral but cannot reach both - uses cane in other arm, lives on first floor Has following equipment at home: Single point cane, Environmental consultant - 2 wheeled, Tour manager, and Grab bars  PLOF:  Modified independent but needed help with getting shoes on intermittently, getting lower close on  PATIENT GOALS: "I feel like my lumbar gets in the way - but would love to be able to go to grocery store again and would love to get back to driving. Get of the oxycodine and be able to turn my head and look so I can drive."  OBJECTIVE:  Note: Objective measures were completed at Evaluation unless otherwise noted.  DIAGNOSTIC FINDINGS:   COMPARISON: 01/24/2024 x-rays.  VIEWS: AP and lateral Impression:   1. No acute fracture. 2. Surgical changes of posterior instrumented fusion spanning C1-T3. Hardware in similar alignment to prior without interval complications. 3. Prior C7-T1 ACDF. No interval hardware complications. 4. Surgical drain in place.   CT 10/11/2023: IMPRESSION: 1. Prior C7-T1 ACDF with progressive back out of the right-sided screw. Interval healing of the T1 vertebral body fracture. 2. Unchanged anterior subluxation of C1 on C2 with moderate spinal stenosis. 3. Likely moderate spinal stenosis and moderate neural foraminal stenosis at C3-4. 4. Likely moderate spinal stenosis and moderate to severe right neural foraminal stenosis at C4-5. 5. Moderate right and mild left neural foraminal stenosis at C7-T1.  COGNITION: Overall cognitive status: Within functional limits for tasks assessed                                           TREATMENT:    Self Care:  Vitals:   02/23/24 1642  BP: 126/87  Pulse: 82  Seated on RUE at rest  Vitals assessed as noted above, mildly elevated but [facility] for therapy PT  educated on surgeon not recommending referral for podiatry or SLP referral at this time as second surgery in close proximity, PT encouraged patient to follow up back with doctor at upcoming visit  PT discussed and reviewed POC, reviewed cervical precautions and recommend patient discuss at upcoming visit with surgeon, PT also discussed going on hold a few weeks as patient feels has what she needs for balance and walking program and  will plan to resume with emphasis on cervical work   Questions to Follow Up: Updates to cervical precautions and if updated printed or faxed confirmation to PT Questions about speech and podiatry  PT business card for surgeon to follow up with PT as indicated business card   NMR:   Trialed progression of balance exercises:  Trialed marching with minUE in walker x 5 steps - not tolerated due to extent of RA in wrists and back Eyes closed [id number]-[id number] in corner for HEP (see below for details): discussed how to progress at home with varying foot distances 4 x 10-30 seconds   PATIENT EDUCATION: Education details: Updates to HEP Person educated: Patient Education method: Explanation Education comprehension: verbalized understanding and needs further education  HOME EXERCISE PROGRAM: Access Code: [email or web address] URL: [email or web address] Date: 02/23/2024 Prepared by: Camella Cave  Exercises - Tandem Stance  - 1 x daily - 7 x weekly - 3 sets - 30 seconds  hold - Corner Balance Feet Together With Eyes Closed  - 1 x daily - 7 x weekly - 3 sets - 30 seconds hold  HEP walking program 4x week for 10 minutes (as symptoms allow)   GOALS: Goals reviewed with patient? Yes  SHORT TERM GOALS: Target date: 03/02/2024  Patient will report demonstrate independence with intial HEP in order to maintain current gains and continue to progress after physical therapy discharge.   Baseline: To be provided  Goal status: INITIAL  2.  Berg to be assessed / LTG  written Baseline:  Goal status: INITIAL  LONG TERM GOALS: Target date: 04/06/2024  Patient will report demonstrate independence with final HEP in order to maintain current gains and continue to progress after physical therapy discharge.   Baseline: To be provided  Goal status: INITIAL  2.  Patient will improve gait speed to 0.6 m/s with LRAD to indicate improvement to the level of community ambulator in order to participate more easily in activities outside of the home.   Baseline: 0.49 m/s with SPC (SBA) Goal status: INITIAL  3.  Patient will improve their 5x Sit to Stand score to less than 15 seconds with least amount of UE use as needed to demonstrate a decreased risk for falls and improved LE strength.   Baseline: 18.9 seconds with UE use  Goal status: INITIAL  4.  Patient will improve NDI score to 47% impairment indicate a clinically important improvement in neck pain.   Baseline: 62% impairment Goal status: INITIAL  5.  Berg to be assessed / LTG written Baseline: To be assessed  Goal status:D/C LTG as limitor more pain than balance  ASSESSMENT:  CLINICAL IMPRESSION: Skilled physical therapy session emphasized review of topics for patient to discuss including reason for deferring SLP/podiatry referral and updates to cervical precautions. PT recommending hold until updates to cervical precautions with patient in agreement. Will resume once medically cleared for progression of cervical work. Continue POC as able.   OBJECTIVE IMPAIRMENTS: Abnormal gait, decreased balance, decreased endurance, difficulty walking, decreased ROM, decreased strength, and pain.   ACTIVITY LIMITATIONS: carrying, standing, squatting, reach over head, and locomotion level  PARTICIPATION LIMITATIONS: meal prep, cleaning, community activity, and yard work  PERSONAL FACTORS: Age, Time since onset of injury/illness/exacerbation, and 1-2 comorbidities: see above are also affecting patient's functional  outcome.   REHAB POTENTIAL: Fair limited by need for additional lumbar surgery, RA  CLINICAL DECISION MAKING: Evolving/moderate complexity  EVALUATION COMPLEXITY: Moderate  PLAN:  PT FREQUENCY: 1x/week  PT DURATION: 6 weeks  PLANNED INTERVENTIONS: 97164- PT Re-evaluation, 97110-Therapeutic exercises, 97530- Therapeutic activity, 97112- Neuromuscular re-education, 97535- Self Care, 16109- Manual therapy, [phone]- Gait training, [phone]- Aquatic Therapy, and Dry Needling  PLAN FOR NEXT SESSION:   *Patient has been on hold for a few weeks - has she received updated cervical precautions if so remaining visits to focus more on cervical work/pain management within updated precautions, how is walking program going, holding major strength work etc due to tolerance with upcoming back surgery   What did surgeon say about SLP and podiatry referral - at last discussion deferred due to upcoming back surgery  Coreen Devoid, PT, DPT 02/24/2024, 1:54 PM

## 2024-02-23 NOTE — Therapy (Signed)
 OUTPATIENT OCCUPATIONAL THERAPY NEURO TREATMENT  Patient Name: Caitlin Wagner MRN: [id number] DOB:01-Feb-1966, 58 y.o., female Today's Date: 02/23/2024  PCP: Dr. Luster Salters (rheumatologist), Stamey, Lowell Rude, FNP (PCP)  REFERRING PROVIDER: Staci Dykes, MD   END OF SESSION:  OT End of Session - 02/23/24 1525     Visit Number 4    Date for OT Re-Evaluation 04/07/24    Authorization Type [employer] Medicare    OT Start Time 1534    OT Stop Time 1618    OT Time Calculation (min) 44 min    Activity Tolerance Patient tolerated treatment well    Behavior During Therapy [facility] for tasks assessed/performed             Past Medical History:  Diagnosis Date   Arthritis    RA   ASCUS of cervix with negative high risk HPV 10/2017   High risk HPV infection 03/2012   HSV (herpes simplex virus) anogenital infection 12/2016   LGSIL (low grade squamous intraepithelial dysplasia) 2013/2017   03/2012 positive high risk HPV,  09/2015 with negative high-risk HPV   Rheumatoid arthritis(714.0)    Spinal stenosis    Past Surgical History:  Procedure Laterality Date   CERVICAL SPINE SURGERY     08/1992 x2 surgeries 1 week apart.   feet reconstruction surgery     bilateral    I & D KNEE WITH POLY EXCHANGE Left 04/11/2018   Procedure: REVISION LEFT TOTAL KNEE- POLY EXCHANGE;  Surgeon: Adonica Hoose, MD;  Location: [facility] OR;  Service: Orthopedics;  Laterality: Left;  Needs RNFA   LUMBAR FUSION  02/2023   TONSILLECTOMY  age 47   TOTAL HIP ARTHROPLASTY Right 12/2020   TOTAL HIP ARTHROPLASTY Left 12/2022   TOTAL KNEE ARTHROPLASTY Left 03/22/2017   Procedure: TOTAL KNEE ARTHROPLASTY w/ Navigation;  Surgeon: Adonica Hoose, MD;  Location: [facility] OR;  Service: Orthopedics;  Laterality: Left;   TOTAL KNEE ARTHROPLASTY Right 05/10/2017   Procedure: TOTAL KNEE ARTHROPLASTY;  Surgeon: Adonica Hoose, MD;  Location: [facility] OR;  Service: Orthopedics;  Laterality: Right;   TOTAL KNEE REVISION Left 07/2020    level 3   Patient Active Problem List   Diagnosis Date Noted   Cervical stenosis of spinal canal 10/01/2023   Lumbar stenosis with neurogenic claudication 10/01/2023   Failed total knee, left, initial encounter (HCC) 04/11/2018   Degenerative arthritis of right knee 05/10/2017   Degenerative joint disease of right knee 05/10/2017   Rheumatoid arthritis (HCC)     ONSET DATE: 02/01/2024 (referral date)   REFERRING DIAG: S/P cervical spinal fusion   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   Pt accompanied by: self - husband drove her  PERTINENT HISTORY: RA, s/p fusion and laminectomy from C1-T3 and a muscle overlay in the back on 01/24/2024, numerous surgeries related to RA   PRECAUTIONS: Cervical and Fall (RA - will need lumbar surgery and RA ulnar drift in UE digits)   WEIGHT BEARING RESTRICTIONS: Yes no lifting over 10 lb   PAIN:  Are you having pain? Yes: NPRS scale: 5/10 Pain location: neck  FALLS: Has patient fallen in last 6 months? Yes. 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  HAND DOMINANCE: Right  ADLs:  Eating: leans into UE to feed self Grooming: husband puts hair up and brushes hair (pt afraid she will hit her incision with a brush); generally mod I UB Dressing: max to total A LB Dressing: max A Toileting: mod I Bathing: mod A Tub Shower transfers: min A  IADLs: Shopping: online - husband picks up Light housekeeping: has paid cleaner that comes in 1 x month Meal Prep: dependent Community mobility: dependent Medication management: mod I Handwriting: no recent changes  MOBILITY STATUS: Independent with SPC  ACTIVITY TOLERANCE: Activity tolerance: fair  FUNCTIONAL OUTCOME MEASURES: 02/17/24: PSFS = 2.3  UPPER EXTREMITY ROM:     AROM Right (eval) Left (eval)  Shoulder flexion 45* WFL  Shoulder abduction 45* WFL  Elbow flexion [facility] WFL  Elbow extension [facility] WFL  Wrist flexion [facility] WFL  Wrist extension WFL WFL  Forearm pronation WFL WFL  Forearm supination 60* WFL  Digit Composite Flexion [facility] WFL with exception to thumb (goes to middle of palm)   Digit Composite Extension Lacks extension at MCP (mild hyperextension at 4th and 5th PIPs) Lacks extension at MCP (hyperextension at 4th and 5th PIPs)  Digit Opposition To 2nd and 3rd digits only To 2nd digit only  (Blank rows = not tested)  UPPER EXTREMITY MMT:     BUE: BFL  HAND FUNCTION: 02/17/24 Grip strength: Right: 12.7, 15.6  lbs; Left: 11.2, 13.0 lbs Average Right: 14.2 lbs Left 12.1 lbs  COORDINATION: 02/17/24 9 Hole Peg test: Right: 29.06 sec; Left: 28.97 sec  SENSATION: WFL  EDEMA: Edema with RA, better controlled with current medications  MUSCLE TONE: BUE - slight hypotonicity in hands  COGNITION: Overall cognitive status: Within  functional limits for tasks assessed  VISION: Subjective report: no changes Baseline vision: Wears glasses all the time  VISION ASSESSMENT: WFL  PERCEPTION: Not tested  PRAXIS: Not tested  OBSERVATIONS: Pt appears well-kept. Uses SPC for slow, altered gait though no LOB. Ulnar drifting of B hands with limited MCP extension.                                                                                                                    TODAY'S TREATMENT :  OT educated pt on bra donning strategies including use of AD.   Discussed doctor recommendations for therapy as well as follow-up with [facility] for B prefabs.   Discussed LE wedge options and use of log roll for bed and mat mobility.   OT initiated dowel AAROM HEP including shoulder flex, ER, shoulder horizontal abduction and adduction, elbow flex and ext, wrist flex and ext, flip flops, and chest press for improved ROM of affected extremity. Pt able to return demonstration of each exercise x10 each. Handout provided.   PATIENT EDUCATION: Education details: see above Person educated: Patient Education method: Explanation, Demonstration, Tactile cues, Verbal cues, and Handouts Education comprehension: verbalized understanding, verbal cues required, tactile cues required, and needs further education  HOME EXERCISE PROGRAM: 02/17/24: Sleep positions/Return to driving  10/24/1094: dowel HEP  GOALS:  SHORT TERM GOALS: Target date: 03/10/2024  Pt to be independent with B splint wear and care. Baseline: New to outpt OT Goal status: IN Progress  2.  Pt will independently recall at least 3 joint protection, ergonomics, and body mechanic principles as noted in pt instructions.   Baseline: New to outpt OT Goal status: IN Progress  LONG TERM GOALS: Target date: 04/07/2024    Patient will demonstrate updated BUE HEP with visual handouts only for proper execution. Baseline: New to outpt OT Goal status: INITIAL  2.  Patient will  have adequate hand strength and coordination to use modified techniques and adaptive equipment for hobbies, cooking and self care AND no loss of strength or coordination via standardized tests.  Baseline: Grip Right: 14.2 lbs Left 12.1 lbs AND 9 Hole Peg test: Right: 29.06 sec; Left: 28.97 sec Goal status: IN Progress  3.  Patient will report at least two-point increase in average PSFS score or at least three-point increase in a single activity score indicating functionally significant improvement given minimum detectable change. Baseline: 2.3 total score (See above for individual activity scores.  Goal status: IN Progress  ASSESSMENT:  CLINICAL IMPRESSION: Patient demonstrates good understanding of HEP and verbalizes good understanding of recommendations as needed to progress towards goals.   PERFORMANCE DEFICITS: in functional skills including ADLs, IADLs, coordination, ROM, strength, pain, Fine motor control, Gross motor control, mobility, endurance, decreased knowledge of precautions, decreased knowledge of use of DME, and UE functional use.   IMPAIRMENTS: are limiting patient from ADLs, IADLs, rest and sleep, and leisure.   CO-MORBIDITIES: has co-morbidities such as RA and pending spinal surgery  that affects occupational performance. Patient will benefit from skilled OT to address above impairments and improve overall function.  REHAB POTENTIAL: Fair given chronicity and extent of BUE impairments  PLAN:  OT FREQUENCY: 2x/week  OT DURATION: 8 weeks  PLANNED INTERVENTIONS: 97168 OT Re-evaluation, 97535 self care/ADL training, 04540 therapeutic exercise, 97530 therapeutic activity, 97112 neuromuscular re-education, 97140 manual therapy, 97113 aquatic therapy, 97035 ultrasound, 97018 paraffin, 98119 fluidotherapy, 97010 moist heat, 97032 electrical stimulation (manual), 97760 Orthotic Initial, 97763 Orthotic/Prosthetic subsequent, passive range of motion, functional mobility training,  energy conservation, coping strategies training, patient/family education, and DME and/or AE instructions  RECOMMENDED OTHER SERVICES: N/A for this visit  CONSULTED AND AGREED WITH PLAN OF CARE: Patient  PLAN FOR NEXT SESSION:  Check on hand splints through [facility] protection Tendon glides in splint or glove (improved MCP alignment first) Review dowel HEP supine  Paraffin trials as needed  Altamease Asters, OT 02/23/2024, 4:45 PM

## 2024-02-24 ENCOUNTER — Encounter: Payer: Self-pay | Admitting: Physical Therapy

## 2024-02-24 DIAGNOSIS — M542 Cervicalgia: Secondary | ICD-10-CM | POA: Diagnosis not present

## 2024-02-24 DIAGNOSIS — Z981 Arthrodesis status: Secondary | ICD-10-CM | POA: Diagnosis not present

## 2024-02-28 ENCOUNTER — Telehealth: Payer: Self-pay | Admitting: Occupational Therapy

## 2024-02-28 ENCOUNTER — Ambulatory Visit: Admitting: Physical Therapy

## 2024-02-28 ENCOUNTER — Ambulatory Visit: Admitting: Occupational Therapy

## 2024-02-28 DIAGNOSIS — M6281 Muscle weakness (generalized): Secondary | ICD-10-CM | POA: Diagnosis not present

## 2024-02-28 DIAGNOSIS — M0579 Rheumatoid arthritis with rheumatoid factor of multiple sites without organ or systems involvement: Secondary | ICD-10-CM

## 2024-02-28 DIAGNOSIS — M6249 Contracture of muscle, multiple sites: Secondary | ICD-10-CM

## 2024-02-28 DIAGNOSIS — R278 Other lack of coordination: Secondary | ICD-10-CM

## 2024-02-28 DIAGNOSIS — R252 Cramp and spasm: Secondary | ICD-10-CM

## 2024-02-28 NOTE — Therapy (Signed)
 OUTPATIENT OCCUPATIONAL THERAPY NEURO TREATMENT  Patient Name: Caitlin Wagner MRN: [id number] DOB:28-Nov-1965, 58 y.o., female Today's Date: 02/28/2024  PCP: Dr. Luster Salters (rheumatologist), Stamey, Lowell Rude, FNP (PCP)  REFERRING PROVIDER: Staci Dykes, MD   END OF SESSION:  OT End of Session - 02/28/24 1404     Visit Number 5    Date for OT Re-Evaluation 04/07/24    Authorization Type [employer] Medicare    OT Start Time 1404    OT Stop Time 1445    OT Time Calculation (min) 41 min    Activity Tolerance Patient tolerated treatment well    Behavior During Therapy [facility] for tasks assessed/performed             Past Medical History:  Diagnosis Date   Arthritis    RA   ASCUS of cervix with negative high risk HPV 10/2017   High risk HPV infection 03/2012   HSV (herpes simplex virus) anogenital infection 12/2016   LGSIL (low grade squamous intraepithelial dysplasia) 2013/2017   03/2012 positive high risk HPV,  09/2015 with negative high-risk HPV   Rheumatoid arthritis(714.0)    Spinal stenosis    Past Surgical History:  Procedure Laterality Date   CERVICAL SPINE SURGERY     08/1992 x2 surgeries 1 week apart.   feet reconstruction surgery     bilateral    I & D KNEE WITH POLY EXCHANGE Left 04/11/2018   Procedure: REVISION LEFT TOTAL KNEE- POLY EXCHANGE;  Surgeon: Adonica Hoose, MD;  Location: [facility] OR;  Service: Orthopedics;  Laterality: Left;  Needs RNFA   LUMBAR FUSION  02/2023   TONSILLECTOMY  age 83   TOTAL HIP ARTHROPLASTY Right 12/2020   TOTAL HIP ARTHROPLASTY Left 12/2022   TOTAL KNEE ARTHROPLASTY Left 03/22/2017   Procedure: TOTAL KNEE ARTHROPLASTY w/ Navigation;  Surgeon: Adonica Hoose, MD;  Location: [facility] OR;  Service: Orthopedics;  Laterality: Left;   TOTAL KNEE ARTHROPLASTY Right 05/10/2017   Procedure: TOTAL KNEE ARTHROPLASTY;  Surgeon: Adonica Hoose, MD;  Location: [facility] OR;  Service: Orthopedics;  Laterality: Right;   TOTAL KNEE REVISION Left 07/2020    level 3   Patient Active Problem List   Diagnosis Date Noted   Cervical stenosis of spinal canal 10/01/2023   Lumbar stenosis with neurogenic claudication 10/01/2023   Failed total knee, left, initial encounter (HCC) 04/11/2018   Degenerative arthritis of right knee 05/10/2017   Degenerative joint disease of right knee 05/10/2017   Rheumatoid arthritis (HCC)     ONSET DATE: 02/01/2024 (referral date)   REFERRING DIAG: S/P cervical spinal fusion   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 So far she really likes it.   Pt accompanied by: self - husband drove her  PERTINENT HISTORY: RA, s/p fusion and laminectomy from C1-T3 and a muscle overlay in the back on 01/24/2024, numerous surgeries related to RA   PRECAUTIONS: Cervical and Fall (RA - will need lumbar surgery and RA ulnar drift in UE digits)   WEIGHT BEARING RESTRICTIONS: Yes no lifting over 10 lb   PAIN:  Are you having pain? Yes:  NPRS scale: 5/10 Pain location: neck  FALLS: Has patient fallen in last 6 months? Yes. 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  HAND DOMINANCE: Right  ADLs:  Eating: leans into UE to feed self Grooming: husband puts hair up and brushes hair (pt afraid she will hit her incision with a brush); generally mod I UB Dressing: max to total A LB Dressing: max A Toileting: mod I Bathing: mod A Tub Shower transfers: min A  IADLs: Shopping: online - husband picks up Light housekeeping: has paid cleaner that comes in 1 x month Meal Prep: dependent Community mobility: dependent Medication management: mod I Handwriting: no recent changes  MOBILITY STATUS: Independent with SPC  ACTIVITY TOLERANCE: Activity tolerance: fair  FUNCTIONAL OUTCOME MEASURES: 02/17/24: PSFS = 2.3  UPPER EXTREMITY ROM:     AROM Right (eval) Left (eval)  Shoulder flexion 45* WFL  Shoulder abduction 45* WFL  Elbow flexion [facility] WFL  Elbow extension [facility] WFL  Wrist flexion [facility] WFL  Wrist extension WFL WFL  Forearm pronation WFL WFL  Forearm supination 60* WFL  Digit Composite Flexion [facility] WFL with exception to thumb (goes to middle of palm)   Digit Composite Extension Lacks extension at MCP (mild hyperextension at 4th and 5th PIPs) Lacks extension at MCP (hyperextension at 4th and 5th PIPs)  Digit Opposition To 2nd and 3rd digits only To 2nd digit only  (Blank rows = not tested)  UPPER EXTREMITY MMT:     BUE: BFL  HAND FUNCTION: 02/17/24 Grip strength: Right: 12.7, 15.6  lbs; Left: 11.2, 13.0 lbs Average Right: 14.2 lbs Left 12.1 lbs  COORDINATION: 02/17/24 9 Hole Peg test: Right: 29.06 sec; Left: 28.97 sec  SENSATION: WFL  EDEMA: Edema with RA, better controlled with current  medications  MUSCLE TONE: BUE - slight hypotonicity in hands  COGNITION: Overall cognitive status: Within functional limits for tasks assessed  VISION: Subjective report: no changes Baseline vision: Wears glasses all the time  VISION ASSESSMENT: WFL  PERCEPTION: Not tested  PRAXIS: Not tested  OBSERVATIONS: Pt appears well-kept. Uses SPC for slow, altered gait though no LOB. Ulnar drifting of B hands with limited MCP extension.  TODAY'S TREATMENT :    OT educated pt on pull over shirt donning strategies (RUE, head, and then LUE).   Discussed follow-up with [facility] for B prefabs.   OT educated pt on hook grips for crocheting and well as ring to hold tension on yarn.   OT provided further education on joint protection, ergonomics, and body mechanic principles as noted in pt instructions as needed to improve UE pain.   PATIENT EDUCATION: Education details: see above Person educated: Patient Education method: Medical illustrator Education comprehension: verbalized understanding and needs further education  HOME EXERCISE PROGRAM: 02/17/24: Sleep positions/Return to driving  10/24/1094: dowel HEP  GOALS:  SHORT TERM GOALS: Target date: 03/10/2024  Pt to be independent with B splint wear and care. Baseline: New to outpt OT Goal status: IN Progress  2.  Pt will independently recall at least 3 joint protection, ergonomics, and body mechanic principles as noted in pt instructions.   Baseline: New to outpt OT Goal status: IN Progress  LONG TERM GOALS: Target date: 04/07/2024    Patient will demonstrate updated BUE HEP with visual handouts only for proper execution. Baseline: New to outpt OT Goal status: INITIAL  2.  Patient will have adequate hand strength and coordination to use modified techniques and adaptive equipment for hobbies, cooking and self  care AND no loss of strength or coordination via standardized tests.  Baseline: Grip Right: 14.2 lbs Left 12.1 lbs AND 9 Hole Peg test: Right: 29.06 sec; Left: 28.97 sec Goal status: IN Progress  3.  Patient will report at least two-point increase in average PSFS score or at least three-point increase in a single activity score indicating functionally significant improvement given minimum detectable change. Baseline: 2.3 total score (See above for individual activity scores.  Goal status: IN Progress  ASSESSMENT:  CLINICAL IMPRESSION: Patient demonstrates  and verbalizes good understanding of recommendations as needed to progress towards goals. Will require additional instructions and review to maximize rehab potential.   PERFORMANCE DEFICITS: in functional skills including ADLs, IADLs, coordination, ROM, strength, pain, Fine motor control, Gross motor control, mobility, endurance, decreased knowledge of precautions, decreased knowledge of use of DME, and UE functional use.   IMPAIRMENTS: are limiting patient from ADLs, IADLs, rest and sleep, and leisure.   CO-MORBIDITIES: has co-morbidities such as RA and pending spinal surgery that affects occupational performance. Patient will benefit from skilled OT to address above impairments and improve overall function.  REHAB POTENTIAL: Fair given chronicity and extent of BUE impairments  PLAN:  OT FREQUENCY: 2x/week  OT DURATION: 8 weeks  PLANNED INTERVENTIONS: 97168 OT Re-evaluation, 97535 self care/ADL training, 04540 therapeutic exercise, 97530 therapeutic activity, 97112 neuromuscular re-education, 97140 manual therapy, 97113 aquatic therapy, 97035 ultrasound, 97018 paraffin, 98119 fluidotherapy, 97010 moist heat, 97032 electrical stimulation (manual), 97760 Orthotic Initial, 97763 Orthotic/Prosthetic subsequent, passive range of motion, functional mobility training, energy conservation, coping strategies training, patient/family education,  and DME and/or AE instructions  RECOMMENDED OTHER SERVICES: N/A for this visit  CONSULTED AND AGREED WITH PLAN OF CARE: Patient  PLAN FOR NEXT SESSION:  Check on hand splints through [facility] protection review Tendon glides in splint or glove (improved MCP alignment first) Review dowel HEP supine  Paraffin trials as needed  Phone loop?  Altamease Asters, OT 02/28/2024, 3:07 PM

## 2024-03-01 ENCOUNTER — Ambulatory Visit: Admitting: Occupational Therapy

## 2024-03-01 DIAGNOSIS — M0579 Rheumatoid arthritis with rheumatoid factor of multiple sites without organ or systems involvement: Secondary | ICD-10-CM

## 2024-03-01 DIAGNOSIS — R252 Cramp and spasm: Secondary | ICD-10-CM

## 2024-03-01 DIAGNOSIS — M6281 Muscle weakness (generalized): Secondary | ICD-10-CM | POA: Diagnosis not present

## 2024-03-01 DIAGNOSIS — M6249 Contracture of muscle, multiple sites: Secondary | ICD-10-CM

## 2024-03-01 DIAGNOSIS — R278 Other lack of coordination: Secondary | ICD-10-CM

## 2024-03-01 NOTE — Therapy (Signed)
 OUTPATIENT OCCUPATIONAL THERAPY NEURO TREATMENT  Patient Name: Caitlin Wagner MRN: [id number] DOB:August 05, 1966, 58 y.o., female Today's Date: 03/01/2024  PCP: Dr. Luster Salters (rheumatologist), Stamey, Lowell Rude, FNP (PCP)  REFERRING PROVIDER: Staci Dykes, MD   END OF SESSION:  OT End of Session - 03/01/24 1539     Visit Number 6    Date for OT Re-Evaluation 04/07/24    Authorization Type [employer] Medicare    OT Start Time 1536    OT Stop Time 1629    OT Time Calculation (min) 53 min    Activity Tolerance Patient tolerated treatment well    Behavior During Therapy [facility] for tasks assessed/performed            Past Medical History:  Diagnosis Date   Arthritis    RA   ASCUS of cervix with negative high risk HPV 10/2017   High risk HPV infection 03/2012   HSV (herpes simplex virus) anogenital infection 12/2016   LGSIL (low grade squamous intraepithelial dysplasia) 2013/2017   03/2012 positive high risk HPV,  09/2015 with negative high-risk HPV   Rheumatoid arthritis(714.0)    Spinal stenosis    Past Surgical History:  Procedure Laterality Date   CERVICAL SPINE SURGERY     08/1992 x2 surgeries 1 week apart.   feet reconstruction surgery     bilateral    I & D KNEE WITH POLY EXCHANGE Left 04/11/2018   Procedure: REVISION LEFT TOTAL KNEE- POLY EXCHANGE;  Surgeon: Adonica Hoose, MD;  Location: [facility] OR;  Service: Orthopedics;  Laterality: Left;  Needs RNFA   LUMBAR FUSION  02/2023   TONSILLECTOMY  age 65   TOTAL HIP ARTHROPLASTY Right 12/2020   TOTAL HIP ARTHROPLASTY Left 12/2022   TOTAL KNEE ARTHROPLASTY Left 03/22/2017   Procedure: TOTAL KNEE ARTHROPLASTY w/ Navigation;  Surgeon: Adonica Hoose, MD;  Location: [facility] OR;  Service: Orthopedics;  Laterality: Left;   TOTAL KNEE ARTHROPLASTY Right 05/10/2017   Procedure: TOTAL KNEE ARTHROPLASTY;  Surgeon: Adonica Hoose, MD;  Location: [facility] OR;  Service: Orthopedics;  Laterality: Right;   TOTAL KNEE REVISION Left 07/2020    level 3   Patient Active Problem List   Diagnosis Date Noted   Cervical stenosis of spinal canal 10/01/2023   Lumbar stenosis with neurogenic claudication 10/01/2023   Failed total knee, left, initial encounter (HCC) 04/11/2018   Degenerative arthritis of right knee 05/10/2017   Degenerative joint disease of right knee 05/10/2017   Rheumatoid arthritis (HCC)     ONSET DATE: 02/01/2024 (referral date)   REFERRING DIAG: S/P cervical spinal fusion   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 She has been able to do dowel exercises with better tolerance at home and has been able to raise RUE up higher than normal.   Pt accompanied by: self - husband drove her  PERTINENT HISTORY: RA, s/p fusion and laminectomy from C1-T3 and a muscle overlay in the back on 01/24/2024, numerous surgeries related to RA   PRECAUTIONS: Cervical and Fall (RA - will need lumbar surgery and RA ulnar drift in UE digits)   WEIGHT BEARING RESTRICTIONS: Yes no lifting over 10 lb   PAIN:  Are you  having pain? Yes: NPRS scale: 3/10 Pain location: neck  FALLS: Has patient fallen in last 6 months? Yes. 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  HAND DOMINANCE: Right  ADLs:  Eating: leans into UE to feed self Grooming: husband puts hair up and brushes hair (pt afraid she will hit her incision with a brush); generally mod I UB Dressing: max to total A LB Dressing: max A Toileting: mod I Bathing: mod A Tub Shower transfers: min A  IADLs: Shopping: online - husband picks up Light housekeeping: has paid cleaner that comes in 1 x month Meal Prep: dependent Community mobility: dependent Medication management: mod I Handwriting: no recent changes  MOBILITY STATUS: Independent with SPC  ACTIVITY TOLERANCE: Activity tolerance: fair  FUNCTIONAL OUTCOME MEASURES: 02/17/24: PSFS = 2.3  UPPER EXTREMITY ROM:     AROM Right (eval) Left (eval)  Shoulder flexion 45* WFL  Shoulder abduction 45* WFL  Elbow flexion [facility] WFL  Elbow extension [facility] WFL  Wrist flexion [facility] WFL  Wrist extension WFL WFL  Forearm pronation WFL WFL  Forearm supination 60* WFL  Digit Composite Flexion [facility] WFL with exception to thumb (goes to middle of palm)   Digit Composite Extension Lacks extension at MCP (mild hyperextension at 4th and 5th PIPs) Lacks extension at MCP (hyperextension at 4th and 5th PIPs)  Digit Opposition To 2nd and 3rd digits only To 2nd digit only  (Blank rows = not tested)  UPPER EXTREMITY MMT:     BUE: BFL  HAND FUNCTION: 02/17/24 Grip strength: Right: 12.7, 15.6  lbs; Left: 11.2, 13.0 lbs Average Right: 14.2 lbs Left 12.1 lbs  COORDINATION: 02/17/24 9 Hole Peg test: Right: 29.06 sec; Left: 28.97 sec  SENSATION: WFL  EDEMA: Edema with RA, better controlled  with current medications  MUSCLE TONE: BUE - slight hypotonicity in hands  COGNITION: Overall cognitive status: Within functional limits for tasks assessed  VISION: Subjective report: no changes Baseline vision: Wears glasses all the time  VISION ASSESSMENT: WFL  PERCEPTION: Not tested  PRAXIS: Not tested  OBSERVATIONS: Pt appears well-kept. Uses SPC for slow, altered gait though no LOB. Ulnar drifting of B hands with limited MCP extension.  TODAY'S TREATMENT :    OT reviewed phone holder and stand options to help reduce pain and further deformity.   OT educated pt on jar opener strategies and adaptive devices.   Reviewed joint protection activities. Pt was independent with recall.   OT educated pt on table top play of Golf Solitaire for BUE ROM, coordination, visual processing, scanning, and sequencing. Pt required minimal cues for proper play.    PATIENT EDUCATION: Education details: see above Person educated: Patient Education method: Explanation, Demonstration, and Verbal cues Education comprehension: verbalized understanding, returned demonstration, verbal cues required, and needs further education  HOME EXERCISE PROGRAM: 02/17/24: Sleep positions/Return to driving  10/24/1094: dowel HEP  GOALS:  SHORT TERM GOALS: Target date: 03/10/2024  Pt to be independent with B splint wear and care. Baseline: New to outpt OT Goal status: IN Progress  2.  Pt will independently recall at least 3 joint protection, ergonomics, and body mechanic principles as noted in pt instructions.   Baseline: New to outpt OT Goal status: MET  LONG TERM GOALS: Target date: 04/07/2024    Patient will demonstrate updated BUE HEP with visual handouts only for proper execution. Baseline: New to outpt OT Goal status: INITIAL  2.  Patient will have adequate hand strength and  coordination to use modified techniques and adaptive equipment for hobbies, cooking and self care AND no loss of strength or coordination via standardized tests.  Baseline: Grip Right: 14.2 lbs Left 12.1 lbs AND 9 Hole Peg test: Right: 29.06 sec; Left: 28.97 sec Goal status: IN Progress  3.  Patient will report at least two-point increase in average PSFS score or at least three-point increase in a single activity score indicating functionally significant improvement given minimum detectable change. Baseline: 2.3 total score (See above for individual activity scores.  Goal status: IN Progress  ASSESSMENT:  CLINICAL IMPRESSION: Patient demonstrates good understanding of functional activities and modifications as needed to progress towards goals. Recommend further adaptions for functional activities as able.   PERFORMANCE DEFICITS: in functional skills including ADLs, IADLs, coordination, ROM, strength, pain, Fine motor control, Gross motor control, mobility, endurance, decreased knowledge of precautions, decreased knowledge of use of DME, and UE functional use.   IMPAIRMENTS: are limiting patient from ADLs, IADLs, rest and sleep, and leisure.   CO-MORBIDITIES: has co-morbidities such as RA and pending spinal surgery that affects occupational performance. Patient will benefit from skilled OT to address above impairments and improve overall function.  REHAB POTENTIAL: Fair given chronicity and extent of BUE impairments  PLAN:  OT FREQUENCY: 2x/week  OT DURATION: 8 weeks  PLANNED INTERVENTIONS: 97168 OT Re-evaluation, 97535 self care/ADL training, 04540 therapeutic exercise, 97530 therapeutic activity, 97112 neuromuscular re-education, 97140 manual therapy, 97113 aquatic therapy, 97035 ultrasound, 97018 paraffin, 98119 fluidotherapy, 97010 moist heat, 97032 electrical stimulation (manual), 97760 Orthotic Initial, 97763 Orthotic/Prosthetic subsequent, passive range of motion, functional mobility  training, energy conservation, coping strategies training, patient/family education, and DME and/or AE instructions  RECOMMENDED OTHER SERVICES: N/A for this visit  CONSULTED AND AGREED WITH PLAN OF CARE: Patient  PLAN FOR NEXT SESSION:  Check on hand splints through [employer] Review golf solitaire as needed Tendon glides in splint or glove (improved MCP alignment first) Review dowel HEP supine  Paraffin trials as needed  Altamease Asters, OT 03/01/2024, 4:49 PM

## 2024-03-06 ENCOUNTER — Ambulatory Visit: Admitting: Physical Therapy

## 2024-03-06 ENCOUNTER — Ambulatory Visit: Admitting: Occupational Therapy

## 2024-03-06 DIAGNOSIS — M6281 Muscle weakness (generalized): Secondary | ICD-10-CM | POA: Diagnosis not present

## 2024-03-06 DIAGNOSIS — M6249 Contracture of muscle, multiple sites: Secondary | ICD-10-CM

## 2024-03-06 DIAGNOSIS — M0579 Rheumatoid arthritis with rheumatoid factor of multiple sites without organ or systems involvement: Secondary | ICD-10-CM

## 2024-03-06 DIAGNOSIS — R278 Other lack of coordination: Secondary | ICD-10-CM

## 2024-03-06 NOTE — Therapy (Signed)
 OUTPATIENT OCCUPATIONAL THERAPY NEURO TREATMENT  Patient Name: Caitlin Wagner MRN: [id number] DOB:1966-09-10, 58 y.o., female Today's Date: 03/06/2024  PCP: Dr. Luster Salters (rheumatologist), Stamey, Lowell Rude, FNP (PCP)  REFERRING PROVIDER: Staci Dykes, MD   END OF SESSION:  OT End of Session - 03/06/24 0932     Visit Number 7    Number of Visits 16    Date for OT Re-Evaluation 04/07/24    Authorization Type [employer] Medicare    OT Start Time 0932    OT Stop Time 1017    OT Time Calculation (min) 45 min    Equipment Utilized During Treatment Thumb spica splint    Activity Tolerance Patient tolerated treatment well    Behavior During Therapy WFL for tasks assessed/performed            Past Medical History:  Diagnosis Date   Arthritis    RA   ASCUS of cervix with negative high risk HPV 10/2017   High risk HPV infection 03/2012   HSV (herpes simplex virus) anogenital infection 12/2016   LGSIL (low grade squamous intraepithelial dysplasia) 2013/2017   03/2012 positive high risk HPV,  09/2015 with negative high-risk HPV   Rheumatoid arthritis(714.0)    Spinal stenosis    Past Surgical History:  Procedure Laterality Date   CERVICAL SPINE SURGERY     08/1992 x2 surgeries 1 week apart.   feet reconstruction surgery     bilateral    I & D KNEE WITH POLY EXCHANGE Left 04/11/2018   Procedure: REVISION LEFT TOTAL KNEE- POLY EXCHANGE;  Surgeon: Adonica Hoose, MD;  Location: [facility] OR;  Service: Orthopedics;  Laterality: Left;  Needs RNFA   LUMBAR FUSION  02/2023   TONSILLECTOMY  age 70   TOTAL HIP ARTHROPLASTY Right 12/2020   TOTAL HIP ARTHROPLASTY Left 12/2022   TOTAL KNEE ARTHROPLASTY Left 03/22/2017   Procedure: TOTAL KNEE ARTHROPLASTY w/ Navigation;  Surgeon: Adonica Hoose, MD;  Location: [facility] OR;  Service: Orthopedics;  Laterality: Left;   TOTAL KNEE ARTHROPLASTY Right 05/10/2017   Procedure: TOTAL KNEE ARTHROPLASTY;  Surgeon: Adonica Hoose, MD;  Location: [facility]  OR;  Service: Orthopedics;  Laterality: Right;   TOTAL KNEE REVISION Left 07/2020   level 3   Patient Active Problem List   Diagnosis Date Noted   Cervical stenosis of spinal canal 10/01/2023   Lumbar stenosis with neurogenic claudication 10/01/2023   Failed total knee, left, initial encounter (HCC) 04/11/2018   Degenerative arthritis of right knee 05/10/2017   Degenerative joint disease of right knee 05/10/2017   Rheumatoid arthritis (HCC)     ONSET DATE: 02/01/2024 (referral date)   REFERRING DIAG: S/P cervical spinal fusion   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  She is able to raise her arms higher and was able to scoop her coffee into the coffee filter on her own.  With trial of hand/wrist/thumb splint today, pt expressed concern about toileting activities with a soft splint applied.  Pt accompanied by: self   PERTINENT HISTORY: RA, s/p fusion and laminectomy from C1-T3 and a muscle overlay in the back on 01/24/2024, numerous surgeries related to RA   PRECAUTIONS: Cervical and Fall (RA - will  need lumbar surgery and RA ulnar drift in UE digits)   WEIGHT BEARING RESTRICTIONS: Yes no lifting over 10 lb   PAIN:  Are you having pain? No  FALLS: Has patient fallen in last 6 months? Yes. 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  HAND DOMINANCE: Right  ADLs:  Eating: leans into UE to feed self Grooming: husband puts hair up and brushes hair (pt afraid she will hit her incision with a brush); generally mod I UB Dressing: max to total A LB Dressing: max A Toileting: mod I Bathing: mod A Tub Shower transfers: min A  IADLs: Shopping: online - husband picks up Light housekeeping: has paid cleaner that comes in 1 x month Meal Prep: dependent Community mobility: dependent Medication management: mod I Handwriting: no recent changes  MOBILITY STATUS: Independent with SPC  ACTIVITY TOLERANCE: Activity tolerance: fair  FUNCTIONAL OUTCOME MEASURES: 02/17/24: PSFS = 2.3  UPPER EXTREMITY ROM:     AROM Right (eval) Left (eval)  Shoulder flexion 45* WFL  Shoulder abduction 45* WFL  Elbow flexion [facility] WFL  Elbow extension [facility] WFL  Wrist flexion [facility] WFL  Wrist extension WFL WFL  Forearm pronation WFL WFL  Forearm supination 60* WFL  Digit Composite Flexion [facility] WFL with exception to thumb (goes to middle of palm)   Digit Composite Extension Lacks extension at MCP (mild hyperextension at 4th and 5th PIPs) Lacks extension at MCP (hyperextension at 4th and 5th PIPs)  Digit Opposition To 2nd and 3rd digits only To 2nd digit only  (Blank rows = not tested)  UPPER EXTREMITY MMT:     BUE: BFL  HAND FUNCTION: 02/17/24 Grip strength: Right: 12.7, 15.6  lbs; Left: 11.2, 13.0 lbs Average Right: 14.2 lbs Left 12.1  lbs  COORDINATION: 02/17/24 9 Hole Peg test: Right: 29.06 sec; Left: 28.97 sec  SENSATION: WFL  EDEMA: Edema with RA, better controlled with current medications  MUSCLE TONE: BUE - slight hypotonicity in hands  COGNITION: Overall cognitive status: Within functional limits for tasks assessed  VISION: Subjective report: no changes Baseline vision: Wears glasses all the time  VISION ASSESSMENT: WFL  PERCEPTION: Not tested  PRAXIS: Not tested  OBSERVATIONS: Pt appears well-kept. Uses SPC for slow, altered gait though no LOB. Ulnar drifting of B hands with limited MCP extension.  TODAY'S TREATMENT :    Orthotic Management: Pt awaiting feedback on covered splints through orthotic vendor and therefore trialled soft Comfort Cool thumb spica splint with size small being too little for her hand and size medium fitting well.  Pt found splint comfortable on R UE for support during therapeutic activities but splint does not offer support for ulnar drift.  During manual attempts to provide neutral MCP alignment, pt noted some discomfort with trying to pick up objects with her fingers/thumb.  Will need to monitor for day and night time splint options for good alignment and max comfort. In addition, each thumb MP joint is different ie) L side is hyperextended and R side tends to be more hyperflexed.  Self Care: Continued to review joint protection activities and AE options including AE for toileting during splinting trials with topic leading more towards exploration of bidet toilets and/or toilet attachments with options for cleaning front and back of peri area, air flow for drying, water and seat warmers as well as different options for accessing controls ie) remote and/or beside toilet.   Therapeutic Activities Utilized Trash Dice game to work on picking up individual small  objects/dice (x40). The object of the game is to keep the most dice and not have to discard your dice into the trash can during turn taking.  Pt engaged in picking up dice 60+ times during the game. Tasks included  -sorting dice by color (20 to each person) -practicing picking up 1 at a time until pt had 2-5+ in palm to sort, roll or drop 1 at a time -picking up and rolling dice individually  -placing dice in containers - larger opening of can or smaller square for dice in lid -turning over trash can lid to keep the 6 dice placed there through turn taking Pt felt this game would be a good one for her and her daughter with special needs to utilize at home and further options like this will be explored with pt.   PATIENT EDUCATION: Education details: AE and jt protection, splint options and FM games Person educated: Patient Education method: Explanation, Demonstration, and Verbal cues Education comprehension: verbalized understanding, returned demonstration, verbal cues required, and needs further education  HOME EXERCISE PROGRAM: 02/17/24: Sleep positions/Return to driving  6/0/4540: dowel HEP  GOALS:  SHORT TERM GOALS: Target date: 03/10/2024  Pt to be independent with B splint wear and care. Baseline: New to outpt OT Goal status: IN Progress  2.  Pt will independently recall at least 3 joint protection, ergonomics, and body mechanic principles as noted in pt instructions.   Baseline: New to outpt OT Goal status: MET  LONG TERM GOALS: Target date: 04/07/2024    Patient will demonstrate updated BUE HEP with visual handouts only for proper execution. Baseline: New to outpt OT Goal status: IN Progress  2.  Patient will have adequate hand strength and coordination to use modified techniques and adaptive equipment for hobbies, cooking and self care AND no loss of strength or coordination via standardized tests.  Baseline: Grip Right: 14.2 lbs Left 12.1 lbs AND 9 Hole Peg test: Right:  29.06 sec; Left: 28.97 sec Goal status: IN Progress  3.  Patient will report at least two-point increase in average PSFS score or at least three-point increase in a single activity score indicating functionally significant improvement given minimum detectable change. Baseline: 2.3 total score (See above for individual activity scores.  Goal status: IN Progress  ASSESSMENT:  CLINICAL IMPRESSION: Patient is a  57 y.o. female who was seen today for occupational therapy treatment for UE dysfunction s/p cervical myelopathy and long-standing history of RA.  Thumb spica splint trial for R UE was comfortable today but noted some possible discomfort with use of fingers with improve MCP alignment - will need to monitor with splint trials.  Patient continues to be appreciative and demonstrates good understanding of functional activities and modifications as needed to progress towards goals. Recommend further adaptions for functional activities as able. Pt will benefit from continued skilled OT services in the outpatient setting to work on impairments as noted at evaluation to help pt return to has high independence as possible.    PERFORMANCE DEFICITS: in functional skills including ADLs, IADLs, coordination, ROM, strength, pain, Fine motor control, Gross motor control, mobility, endurance, decreased knowledge of precautions, decreased knowledge of use of DME, and UE functional use.   IMPAIRMENTS: are limiting patient from ADLs, IADLs, rest and sleep, and leisure.   CO-MORBIDITIES: has co-morbidities such as RA and pending spinal surgery that affects occupational performance. Patient will benefit from skilled OT to address above impairments and improve overall function.  REHAB POTENTIAL: Fair given chronicity and extent of BUE impairments  PLAN:  OT FREQUENCY: 2x/week  OT DURATION: 8 weeks  PLANNED INTERVENTIONS: 97168 OT Re-evaluation, 97535 self care/ADL training, 16109 therapeutic exercise, 97530  therapeutic activity, 97112 neuromuscular re-education, 97140 manual therapy, 97113 aquatic therapy, 97035 ultrasound, 97018 paraffin, 60454 fluidotherapy, 97010 moist heat, 97032 electrical stimulation (manual), 97760 Orthotic Initial, 97763 Orthotic/Prosthetic subsequent, passive range of motion, functional mobility training, energy conservation, coping strategies training, patient/family education, and DME and/or AE instructions  RECOMMENDED OTHER SERVICES: N/A for this visit  CONSULTED AND AGREED WITH PLAN OF CARE: Patient  PLAN FOR NEXT SESSION:  Check on hand splints through [employer] Review golf solitaire as needed Tendon glides in splint or glove (improved MCP alignment first) Review dowel HEP supine  Paraffin trials as needed  Crochet adaptations  Games for pt and daughter (simple ie) trash dice was good -no timed/competitive tasks  Zora Hires, OT 03/06/2024, 7:01 PM

## 2024-03-07 ENCOUNTER — Other Ambulatory Visit: Payer: Self-pay | Admitting: *Deleted

## 2024-03-07 DIAGNOSIS — M79641 Pain in right hand: Secondary | ICD-10-CM

## 2024-03-07 DIAGNOSIS — M180 Bilateral primary osteoarthritis of first carpometacarpal joints: Secondary | ICD-10-CM

## 2024-03-09 ENCOUNTER — Telehealth: Payer: Self-pay

## 2024-03-09 ENCOUNTER — Ambulatory Visit: Admitting: Occupational Therapy

## 2024-03-09 DIAGNOSIS — M6281 Muscle weakness (generalized): Secondary | ICD-10-CM

## 2024-03-09 DIAGNOSIS — R278 Other lack of coordination: Secondary | ICD-10-CM

## 2024-03-09 DIAGNOSIS — M0579 Rheumatoid arthritis with rheumatoid factor of multiple sites without organ or systems involvement: Secondary | ICD-10-CM

## 2024-03-09 DIAGNOSIS — M6249 Contracture of muscle, multiple sites: Secondary | ICD-10-CM

## 2024-03-09 NOTE — Telephone Encounter (Signed)
 PA through [employer] plan appears to have expired.  Submitted an URGENT PA request via [employer] portal  EOC # [id number]  Geraldene Kleine, PharmD, MPH, BCPS, CPP Clinical Pharmacist (Rheumatology and Pulmonology)

## 2024-03-09 NOTE — Telephone Encounter (Signed)
 Received notification from [facility] regarding a prior authorization for RINVOQ . Authorization has been APPROVED from 03/09/24 to 03/08/25. Approval letter sent to scan center. Approval letter faxed to [facility]. Patient notified via MyChart  Authorization # [id number]  Geraldene Kleine, PharmD, MPH, BCPS, CPP Clinical Pharmacist (Rheumatology and Pulmonology)

## 2024-03-09 NOTE — Telephone Encounter (Signed)
 Patient called the office stating that [facility] denied her Rinvoq  stating that her P.A had expired and she will be out of medications this weekend.   Please reach out to patient at [phone]

## 2024-03-09 NOTE — Therapy (Signed)
 OUTPATIENT OCCUPATIONAL THERAPY NEURO TREATMENT  Patient Name: Caitlin Wagner MRN: [id number] DOB:10-14-1966, 58 y.o., female Today's Date: 03/09/2024  PCP: Dr. Luster Salters (rheumatologist), Stamey, Lowell Rude, FNP (PCP)  REFERRING PROVIDER: Staci Dykes, MD   END OF SESSION:  OT End of Session - 03/09/24 1539     Visit Number 8    Number of Visits 16    Date for OT Re-Evaluation 04/07/24    Authorization Type [employer] Medicare    OT Start Time 1539    OT Stop Time 1620    OT Time Calculation (min) 41 min    Activity Tolerance Patient tolerated treatment well    Behavior During Therapy [facility] for tasks assessed/performed            Past Medical History:  Diagnosis Date   Arthritis    RA   ASCUS of cervix with negative high risk HPV 10/2017   High risk HPV infection 03/2012   HSV (herpes simplex virus) anogenital infection 12/2016   LGSIL (low grade squamous intraepithelial dysplasia) 2013/2017   03/2012 positive high risk HPV,  09/2015 with negative high-risk HPV   Rheumatoid arthritis(714.0)    Spinal stenosis    Past Surgical History:  Procedure Laterality Date   CERVICAL SPINE SURGERY     08/1992 x2 surgeries 1 week apart.   feet reconstruction surgery     bilateral    I & D KNEE WITH POLY EXCHANGE Left 04/11/2018   Procedure: REVISION LEFT TOTAL KNEE- POLY EXCHANGE;  Surgeon: Adonica Hoose, MD;  Location: [facility] OR;  Service: Orthopedics;  Laterality: Left;  Needs RNFA   LUMBAR FUSION  02/2023   TONSILLECTOMY  age 56   TOTAL HIP ARTHROPLASTY Right 12/2020   TOTAL HIP ARTHROPLASTY Left 12/2022   TOTAL KNEE ARTHROPLASTY Left 03/22/2017   Procedure: TOTAL KNEE ARTHROPLASTY w/ Navigation;  Surgeon: Adonica Hoose, MD;  Location: [facility] OR;  Service: Orthopedics;  Laterality: Left;   TOTAL KNEE ARTHROPLASTY Right 05/10/2017   Procedure: TOTAL KNEE ARTHROPLASTY;  Surgeon: Adonica Hoose, MD;  Location: [facility] OR;  Service: Orthopedics;  Laterality: Right;   TOTAL KNEE  REVISION Left 07/2020   level 3   Patient Active Problem List   Diagnosis Date Noted   Cervical stenosis of spinal canal 10/01/2023   Lumbar stenosis with neurogenic claudication 10/01/2023   Failed total knee, left, initial encounter (HCC) 04/11/2018   Degenerative arthritis of right knee 05/10/2017   Degenerative joint disease of right knee 05/10/2017   Rheumatoid arthritis (HCC)     ONSET DATE: 02/01/2024 (referral date)   REFERRING DIAG: S/P cervical spinal fusion   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   Pt accompanied by: self   PERTINENT HISTORY: RA, s/p fusion and laminectomy from C1-T3 and a muscle overlay in the back on 01/24/2024, numerous surgeries related to RA   PRECAUTIONS: Cervical and Fall (RA - will need lumbar surgery and RA ulnar drift in UE digits)   WEIGHT BEARING RESTRICTIONS: Yes no lifting over 10 lb   PAIN:  Are you having pain? No and Yes: NPRS scale: 6/10 Pain location: low back  FALLS: Has  patient fallen in last 6 months? Yes. 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  HAND DOMINANCE: Right  ADLs:  Eating: leans into UE to feed self Grooming: husband puts hair up and brushes hair (pt afraid she will hit her incision with a brush); generally mod I UB Dressing: max to total A LB Dressing: max A Toileting: mod I Bathing: mod A Tub Shower transfers: min A  IADLs: Shopping: online - husband picks up Light housekeeping: has paid cleaner that comes in 1 x month Meal Prep: dependent Community mobility: dependent Medication management: mod I Handwriting: no recent changes  MOBILITY STATUS: Independent with SPC  ACTIVITY TOLERANCE: Activity tolerance: fair  FUNCTIONAL OUTCOME MEASURES: 02/17/24: PSFS = 2.3  UPPER EXTREMITY ROM:     AROM Right (eval) Left (eval)  Shoulder flexion 45* WFL  Shoulder abduction 45* WFL  Elbow flexion [facility] WFL  Elbow extension [facility] WFL  Wrist flexion [facility] WFL  Wrist extension WFL WFL  Forearm pronation WFL WFL  Forearm supination 60* WFL  Digit Composite Flexion [facility] WFL with exception to thumb (goes to middle of palm)   Digit Composite Extension Lacks extension at MCP (mild hyperextension at 4th and 5th PIPs) Lacks extension at MCP (hyperextension at 4th and 5th PIPs)  Digit Opposition To 2nd and 3rd digits only To 2nd digit only  (Blank rows = not tested)  UPPER EXTREMITY MMT:     BUE: BFL  HAND FUNCTION: 02/17/24 Grip strength: Right: 12.7, 15.6  lbs; Left: 11.2, 13.0 lbs Average Right: 14.2 lbs Left 12.1 lbs  COORDINATION: 02/17/24 9 Hole Peg test: Right: 29.06 sec; Left: 28.97 sec  SENSATION: WFL  EDEMA: Edema with RA, better controlled with current medications  MUSCLE TONE: BUE - slight  hypotonicity in hands  COGNITION: Overall cognitive status: Within functional limits for tasks assessed  VISION: Subjective report: no changes Baseline vision: Wears glasses all the time  VISION ASSESSMENT: WFL  PERCEPTION: Not tested  PRAXIS: Not tested  OBSERVATIONS: Pt appears well-kept. Uses SPC for slow, altered gait though no LOB. Ulnar drifting of B hands with limited MCP extension.  TODAY'S TREATMENT :    Self Care:   Kinesio tape to B thumbs using mechanical technique with 40-60% stretch. Pt verbalized understanding of tape and wearing schedule with application on affected extremity.  OT educated patient on how to obtain Kinesiotape or similar tape for home completion. OT educated pt on functional grasp assumption with new thumb positioning.    PATIENT EDUCATION: Education details: B thumb kinesio taping Person educated: Patient Education method: Explanation, Demonstration, and Verbal cues Education comprehension: verbalized understanding, returned demonstration, verbal cues required, and needs further education  HOME EXERCISE PROGRAM: 02/17/24: Sleep positions/Return to driving  05/25/5783: dowel HEP  GOALS:  SHORT TERM GOALS: Target date: 03/10/2024  Pt to be independent with B splint wear and care. Baseline: New to outpt OT Goal status: IN Progress  2.  Pt will independently recall at least 3 joint protection, ergonomics, and body mechanic principles as noted in pt instructions.   Baseline: New to outpt OT Goal status: MET  LONG TERM GOALS: Target date: 04/07/2024    Patient will demonstrate updated BUE HEP with visual handouts only for proper execution. Baseline: New to outpt OT Goal status: IN Progress  2.  Patient will have adequate hand strength and coordination to use modified techniques and adaptive equipment for hobbies, cooking and self  care AND no loss of strength or coordination via standardized tests.  Baseline: Grip Right: 14.2 lbs Left 12.1 lbs AND 9 Hole Peg test: Right: 29.06 sec; Left: 28.97 sec Goal status: IN Progress  3.  Patient will report at least two-point increase in average PSFS score or at least three-point increase in a single activity score indicating functionally significant improvement given minimum detectable change. Baseline: 2.3 total score (See above for individual activity scores.  Goal status: IN Progress  ASSESSMENT:  CLINICAL IMPRESSION: Patient's B thumb positioning improved with use of kinesio taping this session though required multi-directional application to achieve desired positioning. L thumb MCP unable to fully correct hyperextension due to joint integrity/arthritic changes. Pt felt that tape allowed for better thumb support and grasp assumption.   PERFORMANCE DEFICITS: in functional skills including ADLs, IADLs, coordination, ROM, strength, pain, Fine motor control, Gross motor control, mobility, endurance, decreased knowledge of precautions, decreased knowledge of use of DME, and UE functional use.   IMPAIRMENTS: are limiting patient from ADLs, IADLs, rest and sleep, and leisure.   CO-MORBIDITIES: has co-morbidities such as RA and pending spinal surgery that affects occupational performance. Patient will benefit from skilled OT to address above impairments and improve overall function.  REHAB POTENTIAL: Fair given chronicity and extent of BUE impairments  PLAN:  OT FREQUENCY: 2x/week  OT DURATION: 8 weeks  PLANNED INTERVENTIONS: 97168 OT Re-evaluation, 97535 self care/ADL training, 69629 therapeutic exercise, 97530 therapeutic activity, 97112 neuromuscular re-education, 97140 manual therapy, 97113 aquatic therapy, 97035 ultrasound, 97018 paraffin, 52841 fluidotherapy, 97010 moist heat, 97032 electrical stimulation (manual), 97760 Orthotic Initial, 97763 Orthotic/Prosthetic  subsequent, passive range of motion, functional mobility training, energy conservation, coping strategies training, patient/family education, and DME and/or AE instructions  RECOMMENDED OTHER SERVICES: N/A for this visit  CONSULTED AND AGREED WITH PLAN OF CARE: Patient  PLAN FOR NEXT SESSION:  Check on hand splints through [employer] Review golf solitaire as needed Tendon glides in splint or glove (improved MCP alignment first) Review dowel HEP supine  Paraffin trials as needed Review taping as needed Crochet adaptations  Games for pt and daughter (simple ie) trash dice was good -no timed/competitive tasks  [employer],  OT 03/09/2024, 4:44 PM

## 2024-03-14 ENCOUNTER — Ambulatory Visit: Admitting: Occupational Therapy

## 2024-03-14 ENCOUNTER — Ambulatory Visit: Admitting: Physical Therapy

## 2024-03-14 DIAGNOSIS — M6281 Muscle weakness (generalized): Secondary | ICD-10-CM

## 2024-03-14 DIAGNOSIS — R293 Abnormal posture: Secondary | ICD-10-CM

## 2024-03-14 DIAGNOSIS — M0579 Rheumatoid arthritis with rheumatoid factor of multiple sites without organ or systems involvement: Secondary | ICD-10-CM

## 2024-03-14 DIAGNOSIS — M542 Cervicalgia: Secondary | ICD-10-CM

## 2024-03-14 DIAGNOSIS — R262 Difficulty in walking, not elsewhere classified: Secondary | ICD-10-CM

## 2024-03-14 DIAGNOSIS — M6249 Contracture of muscle, multiple sites: Secondary | ICD-10-CM

## 2024-03-14 DIAGNOSIS — R278 Other lack of coordination: Secondary | ICD-10-CM

## 2024-03-14 DIAGNOSIS — M5459 Other low back pain: Secondary | ICD-10-CM

## 2024-03-14 NOTE — Telephone Encounter (Signed)
 Note created in error.

## 2024-03-14 NOTE — Therapy (Signed)
 OUTPATIENT OCCUPATIONAL THERAPY NEURO TREATMENT  Patient Name: Caitlin Wagner MRN: [id number] DOB:05/30/1966, 58 y.o., female Today's Date: 03/14/2024  PCP: Dr. Luster Salters (rheumatologist), Stamey, Lowell Rude, FNP (PCP)  REFERRING PROVIDER: Staci Dykes, MD   END OF SESSION:  OT End of Session - 03/14/24 1516     Visit Number 9    Number of Visits 16    Date for OT Re-Evaluation 04/07/24    Authorization Type [employer] Medicare    OT Start Time 1449    OT Stop Time 1530    OT Time Calculation (min) 41 min    Activity Tolerance Patient tolerated treatment well    Behavior During Therapy [facility] for tasks assessed/performed             Past Medical History:  Diagnosis Date   Arthritis    RA   ASCUS of cervix with negative high risk HPV 10/2017   High risk HPV infection 03/2012   HSV (herpes simplex virus) anogenital infection 12/2016   LGSIL (low grade squamous intraepithelial dysplasia) 2013/2017   03/2012 positive high risk HPV,  09/2015 with negative high-risk HPV   Rheumatoid arthritis(714.0)    Spinal stenosis    Past Surgical History:  Procedure Laterality Date   CERVICAL SPINE SURGERY     08/1992 x2 surgeries 1 week apart.   feet reconstruction surgery     bilateral    I & D KNEE WITH POLY EXCHANGE Left 04/11/2018   Procedure: REVISION LEFT TOTAL KNEE- POLY EXCHANGE;  Surgeon: Adonica Hoose, MD;  Location: [facility] OR;  Service: Orthopedics;  Laterality: Left;  Needs RNFA   LUMBAR FUSION  02/2023   TONSILLECTOMY  age 3   TOTAL HIP ARTHROPLASTY Right 12/2020   TOTAL HIP ARTHROPLASTY Left 12/2022   TOTAL KNEE ARTHROPLASTY Left 03/22/2017   Procedure: TOTAL KNEE ARTHROPLASTY w/ Navigation;  Surgeon: Adonica Hoose, MD;  Location: [facility] OR;  Service: Orthopedics;  Laterality: Left;   TOTAL KNEE ARTHROPLASTY Right 05/10/2017   Procedure: TOTAL KNEE ARTHROPLASTY;  Surgeon: Adonica Hoose, MD;  Location: [facility] OR;  Service: Orthopedics;  Laterality: Right;   TOTAL  KNEE REVISION Left 07/2020   level 3   Patient Active Problem List   Diagnosis Date Noted   Cervical stenosis of spinal canal 10/01/2023   Lumbar stenosis with neurogenic claudication 10/01/2023   Failed total knee, left, initial encounter (HCC) 04/11/2018   Degenerative arthritis of right knee 05/10/2017   Degenerative joint disease of right knee 05/10/2017   Rheumatoid arthritis (HCC)     ONSET DATE: 02/01/2024 (referral date)   REFERRING DIAG: S/P cervical spinal fusion   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 She also states the comforter splint makes it feel like bone on bone.   Pt accompanied by: self   PERTINENT HISTORY: RA, s/p fusion and laminectomy from C1-T3 and a muscle overlay in the back on 01/24/2024, numerous surgeries related to RA   PRECAUTIONS: Cervical and Fall (RA - will need lumbar surgery and RA ulnar drift in UE digits)   WEIGHT BEARING RESTRICTIONS: Yes no lifting over 10 lb   PAIN:  Are you  having pain? No and Yes: NPRS scale: 6/10 Pain location: low back  FALLS: Has patient fallen in last 6 months? Yes. 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  HAND DOMINANCE: Right  ADLs:  Eating: leans into UE to feed self Grooming: husband puts hair up and brushes hair (pt afraid she will hit her incision with a brush); generally mod I UB Dressing: max to total A LB Dressing: max A Toileting: mod I Bathing: mod A Tub Shower transfers: min A  IADLs: Shopping: online - husband picks up Light housekeeping: has paid cleaner that comes in 1 x month Meal Prep: dependent Community mobility: dependent Medication management: mod I Handwriting: no recent changes  MOBILITY STATUS: Independent with SPC  ACTIVITY TOLERANCE: Activity tolerance: fair  FUNCTIONAL OUTCOME MEASURES: 02/17/24: PSFS = 2.3  UPPER EXTREMITY ROM:     AROM Right (eval) Left (eval)  Shoulder flexion 45* WFL  Shoulder abduction 45* WFL  Elbow flexion [facility] WFL  Elbow extension [facility] WFL  Wrist flexion [facility] WFL  Wrist extension WFL WFL  Forearm pronation WFL WFL  Forearm supination 60* WFL  Digit Composite Flexion [facility] WFL with exception to thumb (goes to middle of palm)   Digit Composite Extension Lacks extension at MCP (mild hyperextension at 4th and 5th PIPs) Lacks extension at MCP (hyperextension at 4th and 5th PIPs)  Digit Opposition To 2nd and 3rd digits only To 2nd digit only  (Blank rows = not tested)  UPPER EXTREMITY MMT:     BUE: BFL  HAND FUNCTION: 02/17/24 Grip strength: Right: 12.7, 15.6  lbs; Left: 11.2, 13.0 lbs Average Right: 14.2 lbs Left 12.1 lbs  COORDINATION: 02/17/24 9 Hole Peg test: Right: 29.06 sec; Left: 28.97 sec  SENSATION: WFL  EDEMA: Edema with RA, better controlled  with current medications  MUSCLE TONE: BUE - slight hypotonicity in hands  COGNITION: Overall cognitive status: Within functional limits for tasks assessed  VISION: Subjective report: no changes Baseline vision: Wears glasses all the time  VISION ASSESSMENT: WFL  PERCEPTION: Not tested  PRAXIS: Not tested  OBSERVATIONS: Pt appears well-kept. Uses SPC for slow, altered gait though no LOB. Ulnar drifting of B hands with limited MCP extension.  TODAY'S TREATMENT :    - Self-care/home management completed for duration as noted below including:  Trial with comforter splint for R hand in efforts to promote more neutral hand positioning, which was achieved with good fit; however, pt reporting joint discomfort with no improvement to functional use.   OT educated pt on [employer] Soft MP Ulnar Drift Supports for less restricting and comfortable day time use. This particular brace selected due to additional support at thumbs to help with grasp assumption. Information provided on how to obtain.   PATIENT EDUCATION: Education details: hand positioning; bracing options Person educated: Patient Education method: Explanation, Demonstration, and Verbal cues Education comprehension: verbalized understanding, returned demonstration, verbal cues required, and needs further education  HOME EXERCISE PROGRAM: 02/17/24: Sleep positions/Return to driving  11/19/3084: dowel HEP  GOALS:  SHORT TERM GOALS: Target date: 03/10/2024  Pt to be independent with B splint wear and care. Baseline: New to outpt OT Goal status: IN Progress  2.  Pt will independently recall at least 3 joint protection, ergonomics, and body mechanic principles as noted in pt instructions.   Baseline: New to outpt OT Goal status: MET  LONG TERM GOALS: Target date: 04/07/2024    Patient will demonstrate updated BUE HEP  with visual handouts only for proper execution. Baseline: New to outpt OT Goal status: IN Progress  2.  Patient will have adequate hand strength and coordination to use modified techniques and adaptive equipment for hobbies, cooking and self care AND no loss of strength or coordination via standardized tests.  Baseline: Grip Right: 14.2 lbs Left 12.1 lbs AND 9 Hole Peg test: Right: 29.06 sec; Left: 28.97 sec Goal status: IN Progress  3.  Patient will report at least two-point increase in average PSFS score or at least three-point increase in a single activity score indicating functionally significant improvement given minimum detectable change. Baseline: 2.3 total score (See above for individual activity scores.  Goal status: IN Progress  ASSESSMENT:  CLINICAL IMPRESSION: Patient most likely will best tolerate and benefit from resting hand splint at night(alternating between wearing on R and L) as well as hand based neoprene ulnar drift bracing during the day in order to prevent further joint deterioration and promote more functional use.   PERFORMANCE DEFICITS: in functional skills including ADLs, IADLs, coordination, ROM, strength, pain, Fine motor control, Gross motor control, mobility, endurance, decreased knowledge of precautions, decreased knowledge of use of DME, and UE functional use.   IMPAIRMENTS: are limiting patient from ADLs, IADLs, rest and sleep, and leisure.   CO-MORBIDITIES: has co-morbidities such as RA and pending spinal surgery that affects occupational performance. Patient will benefit from skilled OT to address above impairments and improve overall function.  REHAB POTENTIAL: Fair given chronicity and extent of BUE impairments  PLAN:  OT FREQUENCY: 2x/week  OT DURATION: 8 weeks  PLANNED INTERVENTIONS: 97168 OT Re-evaluation, 97535 self care/ADL training, 57846 therapeutic exercise, 97530 therapeutic activity, 97112 neuromuscular re-education, 97140 manual therapy,  97113 aquatic therapy, 97035 ultrasound, 97018 paraffin, 96295 fluidotherapy, 97010 moist heat, 97032 electrical stimulation (manual), 97760 Orthotic Initial, 97763 Orthotic/Prosthetic subsequent, passive range of motion, functional mobility training, energy conservation, coping strategies training, patient/family education, and DME and/or AE instructions  RECOMMENDED OTHER SERVICES: N/A for this visit  CONSULTED AND AGREED WITH PLAN OF CARE: Patient  PLAN FOR NEXT SESSION: progress note Review dowel HEP supine  Check on hand splints through [employer] Review golf solitaire as needed Tendon glides in splint or glove (improved MCP alignment first)  Paraffin trials as needed Review taping as needed Crochet adaptations  Games for pt and daughter (simple ie) trash dice was good -no timed/competitive tasks  [employer] A[employer], OT 03/14/2024, 5:37 PM

## 2024-03-14 NOTE — Therapy (Signed)
 OUTPATIENT PHYSICAL THERAPY NEURO TREATMENT - DISCHARGE NOTE   Patient Name: Caitlin Wagner MRN: [id number] DOB:[birth year], 58 y.o., female Today's Date: 03/14/2024   PCP: Dr. Luster Salters (rheumatologist), Stamey, Lowell Rude, FNP (PCP) REFERRING PROVIDER: Staci Dykes, MD   PHYSICAL THERAPY DISCHARGE SUMMARY  Visits from Start of Care: 4  Current functional level related to goals / functional outcomes: Mod I with RW   Remaining deficits: Ongoing 9/10 back pain   Education / Equipment: Handout for final HEP and walking program   Patient agrees to discharge. Patient goals were met. Patient is being discharged due to maximized rehab potential.    END OF SESSION:  PT End of Session - 03/14/24 1534     Visit Number 4    Number of Visits 7    Date for PT Re-Evaluation 04/06/24    Authorization Type UHC ([facility] primary, medicare advantage)    PT Start Time 1532   from OT session   PT Stop Time 1555   d/c, still has cervical precautions   PT Time Calculation (min) 23 min    Equipment Utilized During Treatment Gait belt    Activity Tolerance Patient tolerated treatment well    Behavior During Therapy WFL for tasks assessed/performed              Past Medical History:  Diagnosis Date   Arthritis    RA   ASCUS of cervix with negative high risk HPV 10/2017   High risk HPV infection 03/2012   HSV (herpes simplex virus) anogenital infection 12/2016   LGSIL (low grade squamous intraepithelial dysplasia) 2013/2017   03/2012 positive high risk HPV,  09/2015 with negative high-risk HPV   Rheumatoid arthritis(714.0)    Spinal stenosis    Past Surgical History:  Procedure Laterality Date   CERVICAL SPINE SURGERY     08/1992 x2 surgeries 1 week apart.   feet reconstruction surgery     bilateral    I & D KNEE WITH POLY EXCHANGE Left 04/11/2018   Procedure: REVISION LEFT TOTAL KNEE- POLY EXCHANGE;  Surgeon: Adonica Hoose, MD;  Location: [facility] OR;  Service:  Orthopedics;  Laterality: Left;  Needs RNFA   LUMBAR FUSION  02/2023   TONSILLECTOMY  age 42   TOTAL HIP ARTHROPLASTY Right 12/2020   TOTAL HIP ARTHROPLASTY Left 12/2022   TOTAL KNEE ARTHROPLASTY Left 03/22/2017   Procedure: TOTAL KNEE ARTHROPLASTY w/ Navigation;  Surgeon: Adonica Hoose, MD;  Location: [facility] OR;  Service: Orthopedics;  Laterality: Left;   TOTAL KNEE ARTHROPLASTY Right 05/10/2017   Procedure: TOTAL KNEE ARTHROPLASTY;  Surgeon: Adonica Hoose, MD;  Location: [facility] OR;  Service: Orthopedics;  Laterality: Right;   TOTAL KNEE REVISION Left 07/2020   level 3   Patient Active Problem List   Diagnosis Date Noted   Cervical stenosis of spinal canal 10/01/2023   Lumbar stenosis with neurogenic claudication 10/01/2023   Failed total knee, left, initial encounter (HCC) 04/11/2018   Degenerative arthritis of right knee 05/10/2017   Degenerative joint disease of right knee 05/10/2017   Rheumatoid arthritis (HCC)     ONSET DATE: 02/01/2024 (referral date)  REFERRING DIAG: S/P cervical spinal fusion   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  Pt did have a follow-up with her PA, will not see the neurosurgeon until early July (July 2-3) and that is the earliest that her cervical precautions will be lifted. Pt agreeable to d/c from PT today and will get a new referral to return to PT once her cervical precautions have been  lifted.   Pt accompanied by: self and spouse in lobby - [name]  PERTINENT HISTORY: RA, s/p fusion and laminectomy from C1-T3 and a muscle overlay in the back on 01/24/2024, numerous surgeries related to RA  PAIN:  Are you having pain? No - Reports none right now but reports well controlled on oxycodone    PRECAUTIONS: Cervical and Fall (RA - will need lumbar surgery and RA ulnar drift in UE digits)  RED FLAGS: Recommend follow up with medical team regarding swallow   WEIGHT BEARING RESTRICTIONS: Yes no lifting over 10 lb  FALLS: Has patient fallen in last 6 months? Yes. Number of falls 1- reports tripping over area rug and needed help to get up, was not using cane at the time   LIVING ENVIRONMENT: Lives with: lives with their spouse - adult daughter that lives at home, "high functioning autistic" per patient Lives in: House/apartment Stairs: Yes: External: 3 steps; bilateral but cannot reach both - uses cane in other arm, lives on first floor Has following equipment at home: Single point cane, Environmental consultant - 2 wheeled, Tour manager, and Grab bars  PLOF:  Modified independent but needed help with getting shoes on intermittently, getting lower close on  PATIENT GOALS: "I feel like my lumbar gets in the way - but would love to be able to go to grocery store again and would love to get back to driving. Get of the oxycodine and be able to turn my head and look so I can drive."  OBJECTIVE:  Note: Objective measures were completed at Evaluation unless otherwise noted.  DIAGNOSTIC FINDINGS:   COMPARISON: 01/24/2024 x-rays.  VIEWS: AP and lateral Impression:   1. No acute fracture. 2. Surgical changes of posterior instrumented fusion spanning C1-T3. Hardware in similar alignment to prior without interval complications. 3. Prior C7-T1 ACDF. No interval hardware complications. 4. Surgical drain in place.   CT 10/11/2023: IMPRESSION: 1. Prior C7-T1 ACDF with progressive back out of the  right-sided screw. Interval healing of the T1 vertebral body fracture. 2. Unchanged anterior subluxation of C1 on C2 with moderate spinal stenosis. 3. Likely moderate spinal stenosis and moderate neural foraminal stenosis at C3-4. 4. Likely moderate spinal stenosis and moderate to severe right neural foraminal stenosis at C4-5. 5. Moderate right and mild left neural foraminal stenosis at C7-T1.  COGNITION: Overall cognitive status: Within functional limits for tasks assessed                                           TREATMENT:    TherAct For LTG assessment: NDI: 18/45, 40% impairment Gait speed: 0.6 m/s 5xSTS: 14 sec with BUE support   PATIENT EDUCATION: Education details: continue HEP and walking program, results of OM and functional implications, plan to d/c from OPPT and pt will get a new referral once her cervical precautions have been lifted Person educated: Patient Education method: Explanation Education comprehension: verbalized understanding  HOME EXERCISE  PROGRAM: Access Code: [email or web address] URL: [email or web address] Date: 02/23/2024 Prepared by: Camella Cave  Exercises - Tandem Stance  - 1 x daily - 7 x weekly - 3 sets - 30 seconds  hold - Corner Balance Feet Together With Eyes Closed  - 1 x daily - 7 x weekly - 3 sets - 30 seconds hold  HEP walking program 4x week for 10 minutes (as symptoms allow)   GOALS: Goals reviewed with patient? Yes  SHORT TERM GOALS: Target date: 03/02/2024  Patient will report demonstrate independence with intial HEP in order to maintain current gains and continue to progress after physical therapy discharge.   Baseline: To be provided  Goal status: MET  2.  Randye Buttner to be assessed / LTG written Baseline:  Goal status: N/A - GOAL DISCHARGED  LONG TERM GOALS: Target date: 04/06/2024  Patient will report demonstrate independence with final HEP in order to maintain current gains and continue to progress after physical therapy  discharge.   Baseline: To be provided  Goal status: MET  2.  Patient will improve gait speed to 0.6 m/s with LRAD to indicate improvement to the level of community ambulator in order to participate more easily in activities outside of the home.   Baseline: 0.49 m/s with SPC (SBA), 0.6 m/s with RW (mod I) (5/27) Goal status: MET  3.  Patient will improve their 5x Sit to Stand score to less than 15 seconds with least amount of UE use as needed to demonstrate a decreased risk for falls and improved LE strength.   Baseline: 18.9 seconds with UE use, 14 sec with BUE (5/27)  Goal status: MET  4.  Patient will improve NDI score to 47% impairment indicate a clinically important improvement in neck pain.   Baseline: 62% impairment, 40% impairment (5/27) Goal status: MET  5.  Berg to be assessed / LTG written Baseline: To be assessed  Goal status:D/C LTG as limitor more pain than balance  ASSESSMENT:  CLINICAL IMPRESSION: Emphasis of skilled PT session on assessing LTG in preparation for d/c from OPPT services this date. Pt has met 4/4 LTG due to being independent with her final HEP, improving her 5xSTS score, improving her NDI score, and improving her gait speed. Pt overall demonstrates decreased pain and decreased disability level as well as improved functional strength and decreased fall risk based on improvements this session. Plan to d/c from OPPT services as pt does not have a follow-up with her neurosurgeon until early July and will not have her cervical precautions lifted until that point. Plan to have patient obtain a new referral after this follow-up appointment with updated precautions and will re-assess PT needs at that time with plan to likely work on cervical ROM and strengthening as safe and able.    OBJECTIVE IMPAIRMENTS: Abnormal gait, decreased balance, decreased endurance, difficulty walking, decreased ROM, decreased strength, and pain.   ACTIVITY LIMITATIONS: carrying,  standing, squatting, reach over head, and locomotion level  PARTICIPATION LIMITATIONS: meal prep, cleaning, community activity, and yard work  PERSONAL FACTORS: Age, Time since onset of injury/illness/exacerbation, and 1-2 comorbidities: see above are also affecting patient's functional outcome.   REHAB POTENTIAL: Fair limited by need for additional lumbar surgery, RA  CLINICAL DECISION MAKING: Evolving/moderate complexity  EVALUATION COMPLEXITY: Moderate  PLAN: Discharge from PT, plan to be referred back to PT after follow-up with neurosurgeon in early July  Lorita Rosa, PT Lorita Rosa, PT, DPT, CSRS  03/14/2024, 3:56 PM

## 2024-03-16 ENCOUNTER — Ambulatory Visit: Admitting: Occupational Therapy

## 2024-03-16 DIAGNOSIS — M6281 Muscle weakness (generalized): Secondary | ICD-10-CM | POA: Diagnosis not present

## 2024-03-16 DIAGNOSIS — M0579 Rheumatoid arthritis with rheumatoid factor of multiple sites without organ or systems involvement: Secondary | ICD-10-CM

## 2024-03-16 DIAGNOSIS — R278 Other lack of coordination: Secondary | ICD-10-CM

## 2024-03-16 DIAGNOSIS — R252 Cramp and spasm: Secondary | ICD-10-CM

## 2024-03-16 DIAGNOSIS — M6249 Contracture of muscle, multiple sites: Secondary | ICD-10-CM

## 2024-03-16 NOTE — Therapy (Signed)
 OUTPATIENT OCCUPATIONAL THERAPY NEURO TREATMENT & PROGRESS NOTE  Patient Name: Caitlin Wagner MRN: [id number] DOB:11-07-1965, 58 y.o., female Today's Date: 03/16/2024  PCP: Dr. Luster Salters (rheumatologist), Stamey, Lowell Rude, FNP (PCP)  REFERRING PROVIDER: Staci Dykes, MD   END OF SESSION:  OT End of Session - 03/16/24 1620     Visit Number 10    Number of Visits 16    Date for OT Re-Evaluation 04/07/24    Authorization Type UHC Medicare    OT Start Time 1620    OT Stop Time 1658    OT Time Calculation (min) 38 min    Activity Tolerance Patient tolerated treatment well    Behavior During Therapy [facility] for tasks assessed/performed             Past Medical History:  Diagnosis Date   Arthritis    RA   ASCUS of cervix with negative high risk HPV 10/2017   High risk HPV infection 03/2012   HSV (herpes simplex virus) anogenital infection 12/2016   LGSIL (low grade squamous intraepithelial dysplasia) 2013/2017   03/2012 positive high risk HPV,  09/2015 with negative high-risk HPV   Rheumatoid arthritis(714.0)    Spinal stenosis    Past Surgical History:  Procedure Laterality Date   CERVICAL SPINE SURGERY     08/1992 x2 surgeries 1 week apart.   feet reconstruction surgery     bilateral    I & D KNEE WITH POLY EXCHANGE Left 04/11/2018   Procedure: REVISION LEFT TOTAL KNEE- POLY EXCHANGE;  Surgeon: Adonica Hoose, MD;  Location: [facility] OR;  Service: Orthopedics;  Laterality: Left;  Needs RNFA   LUMBAR FUSION  02/2023   TONSILLECTOMY  age 29   TOTAL HIP ARTHROPLASTY Right 12/2020   TOTAL HIP ARTHROPLASTY Left 12/2022   TOTAL KNEE ARTHROPLASTY Left 03/22/2017   Procedure: TOTAL KNEE ARTHROPLASTY w/ Navigation;  Surgeon: Adonica Hoose, MD;  Location: [facility];  Service: Orthopedics;  Laterality: Left;   TOTAL KNEE ARTHROPLASTY Right 05/10/2017   Procedure: TOTAL KNEE ARTHROPLASTY;  Surgeon: Adonica Hoose, MD;  Location: [facility] OR;  Service: Orthopedics;  Laterality:  Right;   TOTAL KNEE REVISION Left 07/2020   level 3   Patient Active Problem List   Diagnosis Date Noted   Cervical stenosis of spinal canal 10/01/2023   Lumbar stenosis with neurogenic claudication 10/01/2023   Failed total knee, left, initial encounter (HCC) 04/11/2018   Degenerative arthritis of right knee 05/10/2017   Degenerative joint disease of right knee 05/10/2017   Rheumatoid arthritis (HCC)     ONSET DATE: 02/01/2024 (referral date)   REFERRING DIAG: S/P cervical spinal fusion   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   She feels that she is doing really well with respect to therapy. She is unsure if she will try to pick up knitting or crocheting for awhile.   She has difficulty using right angle knife as she has to be able to stand to use it or her R elbow begins to bother her.   Pt receptive to floor style cell phone and tablet stand as she has not used her tablet since her surgeries.  Pt accompanied by: self   PERTINENT HISTORY: RA, s/p fusion and laminectomy from C1-T3 and a muscle overlay in the back on 01/24/2024, numerous surgeries related to RA   PRECAUTIONS: Cervical and Fall (RA - will need lumbar surgery and RA ulnar drift in UE digits)   WEIGHT BEARING RESTRICTIONS: Yes no lifting over 10 lb   PAIN:  Are you having pain? No and Yes: NPRS scale: 6/10 Pain location: low back  FALLS: Has patient fallen in last 6 months? Yes. 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  HAND DOMINANCE: Right  ADLs:  Eating: leans into UE to feed self Grooming: husband puts hair up and brushes hair (pt afraid she will hit her incision with a brush); generally mod I UB Dressing: max to total A LB Dressing: max A Toileting: mod I Bathing: mod A Tub Shower transfers: min A  IADLs: Shopping: online - husband picks up Light housekeeping: has paid cleaner that comes in 1 x month Meal Prep: dependent Community mobility: dependent Medication management: mod I Handwriting: no recent changes  MOBILITY STATUS: Independent with SPC  ACTIVITY TOLERANCE: Activity tolerance: fair  FUNCTIONAL OUTCOME MEASURES: 02/17/24: PSFS = 2.3  UPPER EXTREMITY ROM:     AROM Right (eval) Left (eval) Right (5/29)  Shoulder flexion 45* WFL 120*  Shoulder abduction 45* WFL 45*  Elbow flexion [facility] WFL   Elbow extension [facility] WFL   Wrist flexion [facility] WFL   Wrist extension WFL WFL   Forearm pronation WFL WFL   Forearm supination 60* WFL WFL  Digit Composite Flexion [facility] WFL with exception to thumb (goes to middle of palm)    Digit Composite Extension Lacks extension at MCP (mild hyperextension at 4th and 5th PIPs) Lacks extension at MCP (hyperextension at 4th and 5th PIPs)   Digit Opposition To 2nd and 3rd digits  only To 2nd digit only   (Blank rows = not tested)  UPPER EXTREMITY MMT:     BUE: BFL  HAND FUNCTION: 02/17/24 Grip strength: Right: 12.7, 15.6  lbs; Left: 11.2, 13.0 lbs Average Right: 14.2 lbs Left 12.1 lbs  COORDINATION: 02/17/24 9 Hole Peg test: Right: 29.06 sec; Left: 28.97 sec  SENSATION: WFL  EDEMA: Edema with RA, better controlled with current medications  MUSCLE TONE: BUE - slight hypotonicity in hands  COGNITION: Overall cognitive status: Within functional limits for tasks assessed  VISION: Subjective report: no changes Baseline vision: Wears glasses all the time  VISION ASSESSMENT: WFL  PERCEPTION: Not tested  PRAXIS: Not tested  OBSERVATIONS: Pt appears well-kept. Uses SPC for slow, altered gait though no LOB. Ulnar drifting of B hands with limited MCP extension.  TODAY'S TREATMENT :    Reviewed PSFS activities and pt made aware these are part of her remaining goals. Discussed adaptions for cutting food.   OT educated pt on floor stand style phone and tablet holder with goose neck.   Reviewed phone loop and positioning of LUE for joint protection.   Therapist reviewed goals with patient and updated patient progression.  No additional functional limitations identified. Objective measures assessed as noted in Goals section to determine progression towards goals.  - Therapeutic exercises completed for duration as noted below including:  OT reviewed dowel HEP and added shoulder abduction stretches for RUE.   PATIENT EDUCATION: Education details: Goal progression; RUE HEP Person educated: Patient Education method: Explanation, Demonstration, and Verbal cues Education comprehension: verbalized understanding, returned demonstration, verbal cues required, and needs further education  HOME EXERCISE PROGRAM: 02/17/24: Sleep positions/Return to  driving  06/19/4781: dowel HEP  GOALS:  SHORT TERM GOALS: Target date: 03/10/2024  Pt to be independent with B splint wear and care. Baseline: New to outpt OT Goal status: IN Progress  2.  Pt will independently recall at least 3 joint protection, ergonomics, and body mechanic principles as noted in pt instructions.   Baseline: New to outpt OT Goal status: MET  LONG TERM GOALS: Target date: 04/07/2024    Patient will demonstrate updated BUE HEP with visual handouts only for proper execution. Baseline: New to outpt OT Goal status: IN Progress  2.  Patient will have adequate hand strength and coordination to use modified techniques and adaptive equipment for hobbies, cooking and self care AND no loss of strength or coordination via standardized tests.  Baseline: Grip Right: 14.2 lbs Left 12.1 lbs AND 9 Hole Peg test: Right: 29.06 sec; Left: 28.97 sec Goal status: IN Progress  3.  Patient will report at least two-point increase in average PSFS score or at least three-point increase in a single activity score indicating functionally significant improvement given minimum detectable change. Baseline: 2.3 total score (See above for individual activity scores.  Goal status: IN Progress  ASSESSMENT:  CLINICAL IMPRESSION: This 10 th progress note is for dates: 02/10/2024 to 03/16/2024. Pt has met 1/2 STGs and 0/3 LTGs. Pt making progress towards goals as expected. Will provide additional skilled OT services in the outpatient setting to work towards remaining goals or until max rehab potential is met.   PERFORMANCE DEFICITS: in functional skills including ADLs, IADLs, coordination, ROM, strength, pain, Fine motor control, Gross motor control, mobility, endurance, decreased knowledge of precautions, decreased knowledge of use of DME, and UE functional use.   IMPAIRMENTS: are limiting patient from ADLs, IADLs, rest and sleep, and leisure.   CO-MORBIDITIES: has co-morbidities such as RA and pending  spinal surgery that affects occupational performance. Patient will benefit from skilled OT to address above impairments and improve overall function.  REHAB POTENTIAL: Fair given chronicity and extent of BUE impairments  PLAN:  OT FREQUENCY: 2x/week  OT DURATION: 8 weeks  PLANNED INTERVENTIONS: 97168 OT Re-evaluation, 97535 self care/ADL training, 95621 therapeutic exercise, 97530 therapeutic activity, 97112 neuromuscular re-education, 97140 manual therapy, 97113 aquatic therapy, 97035 ultrasound, 97018 paraffin, 30865 fluidotherapy, 97010 moist heat, 97032 electrical stimulation (manual), 97760 Orthotic Initial, 97763 Orthotic/Prosthetic subsequent, passive range of motion, functional mobility training, energy conservation, coping strategies training, patient/family education, and DME and/or AE instructions  RECOMMENDED OTHER SERVICES: N/A for this visit  CONSULTED AND AGREED WITH PLAN OF CARE: Patient  PLAN FOR NEXT SESSION: Pt to receive braces from [facility] on 03/24/2024 -  consider reduction in frequency   Review golf solitaire as needed Tendon glides in splint or glove (improved MCP alignment first)  Paraffin trials as needed Review taping as needed Crochet adaptations  Games for pt and daughter (simple ie) trash dice was good -no timed/competitive tasks  Altamease Asters, OT 03/16/2024, 5:15 PM

## 2024-03-20 ENCOUNTER — Ambulatory Visit: Admitting: Physical Therapy

## 2024-03-20 ENCOUNTER — Ambulatory Visit: Attending: Neurological Surgery | Admitting: Occupational Therapy

## 2024-03-20 DIAGNOSIS — R278 Other lack of coordination: Secondary | ICD-10-CM | POA: Diagnosis present

## 2024-03-20 DIAGNOSIS — M0579 Rheumatoid arthritis with rheumatoid factor of multiple sites without organ or systems involvement: Secondary | ICD-10-CM | POA: Diagnosis present

## 2024-03-20 DIAGNOSIS — M6249 Contracture of muscle, multiple sites: Secondary | ICD-10-CM | POA: Diagnosis present

## 2024-03-20 DIAGNOSIS — R252 Cramp and spasm: Secondary | ICD-10-CM | POA: Insufficient documentation

## 2024-03-20 DIAGNOSIS — M6281 Muscle weakness (generalized): Secondary | ICD-10-CM | POA: Diagnosis present

## 2024-03-20 NOTE — Therapy (Signed)
 OUTPATIENT OCCUPATIONAL THERAPY NEURO TREATMENT Patient Name: Caitlin Wagner MRN: [id number] DOB:08/26/1966, 58 y.o., female Today's Date: 03/20/2024  PCP: Dr. Luster Salters (rheumatologist), Stamey, Lowell Rude, FNP (PCP)  REFERRING PROVIDER: Staci Dykes, MD   END OF SESSION:  OT End of Session - 03/20/24 0851     Visit Number 11    Number of Visits 16    Date for OT Re-Evaluation 04/07/24    Authorization Type UHC UMR/UHC Medicare 2025 Covered 100%    Authorization Time Period VL: [place] Auth Required after 25th visit ([facility])    OT Start Time [phone]    OT Stop Time 0950    OT Time Calculation (min) 60 min    Equipment Utilized During Treatment Splint options    Activity Tolerance Patient tolerated treatment well    Behavior During Therapy [facility] for tasks assessed/performed             Past Medical History:  Diagnosis Date   Arthritis    RA   ASCUS of cervix with negative high risk HPV 10/2017   High risk HPV infection 03/2012   HSV (herpes simplex virus) anogenital infection 12/2016   LGSIL (low grade squamous intraepithelial dysplasia) 2013/2017   03/2012 positive high risk HPV,  09/2015 with negative high-risk HPV   Rheumatoid arthritis(714.0)    Spinal stenosis    Past Surgical History:  Procedure Laterality Date   CERVICAL SPINE SURGERY     08/1992 x2 surgeries 1 week apart.   feet reconstruction surgery     bilateral    I & D KNEE WITH POLY EXCHANGE Left 04/11/2018   Procedure: REVISION LEFT TOTAL KNEE- POLY EXCHANGE;  Surgeon: Adonica Hoose, MD;  Location: [facility] OR;  Service: Orthopedics;  Laterality: Left;  Needs RNFA   LUMBAR FUSION  02/2023   TONSILLECTOMY  age 75   TOTAL HIP ARTHROPLASTY Right 12/2020   TOTAL HIP ARTHROPLASTY Left 12/2022   TOTAL KNEE ARTHROPLASTY Left 03/22/2017   Procedure: TOTAL KNEE ARTHROPLASTY w/ Navigation;  Surgeon: Adonica Hoose, MD;  Location: [facility] OR;  Service: Orthopedics;  Laterality: Left;   TOTAL KNEE ARTHROPLASTY Right  05/10/2017   Procedure: TOTAL KNEE ARTHROPLASTY;  Surgeon: Adonica Hoose, MD;  Location: [facility] OR;  Service: Orthopedics;  Laterality: Right;   TOTAL KNEE REVISION Left 07/2020   level 3   Patient Active Problem List   Diagnosis Date Noted   Cervical stenosis of spinal canal 10/01/2023   Lumbar stenosis with neurogenic claudication 10/01/2023   Failed total knee, left, initial encounter (HCC) 04/11/2018   Degenerative arthritis of right knee 05/10/2017   Degenerative joint disease of right knee 05/10/2017   Rheumatoid arthritis (HCC)     ONSET DATE: 02/01/2024 (referral date)   REFERRING DIAG: S/P cervical spinal fusion   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   She also reported that they are planning to get a bidet at home and had looked into a FlexStep stair/lift combo but it runs about $30K.  She feels that she is doing really well with respect to therapy and has got several new ideas.  She has difficulty using right angle knife as she has to be able to stand to use it yet and she had to take the straps  off her phone due to hand discomfort.   Pt accompanied by: self   PERTINENT HISTORY: RA, s/p fusion and laminectomy from C1-T3 and a muscle overlay in the back on 01/24/2024, numerous surgeries related to RA   PRECAUTIONS: Cervical and Fall (RA - will need lumbar surgery and RA ulnar drift in UE digits)   WEIGHT BEARING RESTRICTIONS: Yes no lifting over 10 lb   PAIN:  Are you having pain? No and Yes: NPRS scale: 6/10 Pain location: low back  FALLS: Has patient fallen in last 6 months? Yes. 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  HAND DOMINANCE: Right  ADLs:  Eating: leans into UE to feed self Grooming: husband puts hair up and brushes hair (pt afraid she will hit her incision with a brush); generally mod I UB Dressing: max to total A LB Dressing: max A Toileting: mod I Bathing: mod A Tub Shower transfers: min A  IADLs: Shopping: online - husband picks up Light housekeeping: has paid cleaner that comes in 1 x month Meal Prep: dependent Community mobility: dependent Medication management: mod I Handwriting: no recent changes  MOBILITY STATUS: Independent with SPC  ACTIVITY TOLERANCE: Activity tolerance: fair  FUNCTIONAL OUTCOME MEASURES: 02/17/24: PSFS = 2.3  UPPER EXTREMITY ROM:     AROM Right (eval) Left (eval) Right (5/29)  Shoulder flexion 45* WFL 120*  Shoulder abduction 45* WFL 45*  Elbow flexion [facility] WFL   Elbow extension [facility] WFL   Wrist flexion [facility] WFL   Wrist extension WFL WFL   Forearm pronation WFL WFL   Forearm supination 60* WFL WFL  Digit Composite Flexion [facility] WFL with exception to thumb (goes to middle of palm)    Digit Composite Extension Lacks extension at MCP (mild hyperextension at 4th and 5th PIPs) Lacks extension at MCP (hyperextension at 4th and 5th PIPs)   Digit  Opposition To 2nd and 3rd digits only To 2nd digit only   (Blank rows = not tested)  UPPER EXTREMITY MMT:     BUE: BFL  HAND FUNCTION: 02/17/24 Grip strength: Right: 12.7, 15.6  lbs; Left: 11.2, 13.0 lbs Average Right: 14.2 lbs Left 12.1 lbs  COORDINATION: 02/17/24 9 Hole Peg test: Right: 29.06 sec; Left: 28.97 sec  SENSATION: WFL  EDEMA: Edema with RA, better controlled with current medications  MUSCLE TONE: BUE - slight hypotonicity in hands  COGNITION: Overall cognitive status: Within functional limits for tasks assessed  VISION: Subjective report: no changes Baseline vision: Wears glasses all the time  VISION ASSESSMENT: WFL  PERCEPTION: Not tested  PRAXIS: Not tested  OBSERVATIONS: Pt appears well-kept. Uses SPC for slow, altered gait though no LOB. Ulnar drifting of B hands with limited MCP extension.  TODAY'S TREATMENT :    Self Care: Continued AE education and options with lap style tablet holder located online with pt adding it to her Amazon cart to pursue purchase at home. Pt also assisted with problem solving stairs at home as she has a few to enter the home -with rails in the front but not in the garage where she would enter the home from her vehicle.  Discussed AE considerations with further research to be conducted.  Pt's spouse arrived at end of session to help with problem solving also. Also discussed the return to driving options previously discussed as well - especially starting in an empty parking lot.   Orthotic Assessment: Pt is awaiting splints but also reviewed options as pt needs to be as comfortable as possible to ensure max carryover of wearing splints.  Reviewed xrays with pt and ability to realign fingers/thumb with gentle pressure in palm of hand.  Pt demonstrated the position she holds her hands while laying in the bed position with  hands resting on chest and fairly neutral position able to be achieve.  Pt may benefit from a more cylindrical option with finger loops and OT was able to find a palm guard and work at aligning some loops to hold fingers in place with device marked for consultation with other treating OT for further consideration.   PATIENT EDUCATION: Education details: AE and splint options Person educated: Patient Education method: Explanation, Demonstration, and Verbal cues Education comprehension: verbalized understanding, returned demonstration, verbal cues required, and needs further education  HOME EXERCISE PROGRAM: 02/17/24: Sleep positions/Return to driving  10/24/1094: dowel HEP  GOALS:  SHORT TERM GOALS: Target date: 03/10/2024  Pt to be independent with B splint wear and care. Baseline: New to outpt OT Goal status: IN Progress  2.  Pt will independently recall at least 3 joint protection, ergonomics, and body mechanic principles as noted in pt instructions.   Baseline: New to outpt OT Goal status: MET  LONG TERM GOALS: Target date: 04/07/2024    Patient will demonstrate updated BUE HEP with visual handouts only for proper execution. Baseline: New to outpt OT Goal status: IN Progress  2.  Patient will have adequate hand strength and coordination to use modified techniques and adaptive equipment for hobbies, cooking and self care AND no loss of strength or coordination via standardized tests.  Baseline: Grip Right: 14.2 lbs Left 12.1 lbs AND 9 Hole Peg test: Right: 29.06 sec; Left: 28.97 sec Goal status: IN Progress  3.  Patient will report at least two-point increase in average PSFS score or at least three-point increase in a single activity score indicating functionally significant improvement given minimum detectable change. Baseline: 2.3 total score (See above for individual activity scores.  Goal status: IN Progress  ASSESSMENT:  CLINICAL IMPRESSION: Patient is a 58 y.o. female who  was seen today for occupational therapy evaluation for B UE dysfunction s/p RA and cervical spine surgery. Pt to benefit form comfortable hand splint/positioning devices at night and further options re: AE also being considered.  Pt making progress towards goals and plans are to provide additional skilled OT services in the outpatient setting to work towards remaining goals or until max rehab potential is met.   PERFORMANCE DEFICITS: in functional skills including ADLs, IADLs, coordination, ROM, strength, pain, Fine motor control, Gross motor control, mobility, endurance, decreased knowledge of precautions, decreased knowledge of use of DME, and UE functional use.   IMPAIRMENTS: are limiting patient from ADLs, IADLs, rest  and sleep, and leisure.   CO-MORBIDITIES: has co-morbidities such as RA and pending spinal surgery that affects occupational performance. Patient will benefit from skilled OT to address above impairments and improve overall function.  REHAB POTENTIAL: Fair given chronicity and extent of BUE impairments  PLAN:  OT FREQUENCY: 2x/week  OT DURATION: 8 weeks  PLANNED INTERVENTIONS: 97168 OT Re-evaluation, 97535 self care/ADL training, 54098 therapeutic exercise, 97530 therapeutic activity, 97112 neuromuscular re-education, 97140 manual therapy, 97113 aquatic therapy, 97035 ultrasound, 97018 paraffin, 11914 fluidotherapy, 97010 moist heat, 97032 electrical stimulation (manual), 97760 Orthotic Initial, 97763 Orthotic/Prosthetic subsequent, passive range of motion, functional mobility training, energy conservation, coping strategies training, patient/family education, and DME and/or AE instructions  RECOMMENDED OTHER SERVICES: N/A for this visit  CONSULTED AND AGREED WITH PLAN OF CARE: Patient  PLAN FOR NEXT SESSION: Pt to receive braces from [facility] on 03/24/2024 - consider reduction in frequency   Review golf solitaire as needed Tendon glides in splint or glove (improved MCP  alignment first)  Paraffin trials as needed Review taping as needed Crochet adaptations  Games for pt and daughter (simple ie) trash dice was good -no timed/competitive tasks  Zora Hires, OT 03/20/2024, 6:05 PM

## 2024-03-20 NOTE — Progress Notes (Unsigned)
 Office Visit Note  Patient: Caitlin Wagner             Date of Birth: [birth year]           MRN: [id number]             PCP: Lorenza Romans, FNP Referring: Lorenza Romans, FNP Visit Date: 04/03/2024 Occupation: @GUAROCC @  Subjective:  Medication monitoring   History of Present Illness: Caitlin Wagner is a 58 y.o. female with history of seropositive rheumatoid arthritis.  Patient remains on Rinvoq  15 mg p.o. daily---started September 03, 2023.  Patient recently held Rinvoq  for 1 week prior to and then 1 week after undergoing cervical spine fusion on 01/24/2024 from C1-T3 per patient.  Patient states that she has noticed a tremendous improvement in her symptoms since undergoing surgical intervention.  Patient is hoping to proceed with lumbar fusion surgery intervention in the fall if eligible.   She is currently working with a physical therapist and occupational therapist.  Patient states that she was fitted for hand braces to help with correcting ulnar deviation and extensor tendon ruptures involving both hands.  Patient has been wearing the braces for several hours per day but has been having increased soreness in the extensor tendons extending all the way to her elbows.   Patient states that she recently was evaluated by a podiatrist who discussed possible future surgical intervention. Patient states that her podiatrist recommended a second opinion to discuss the surgical options as well. She states that overall her rheumatoid arthritis remains well-controlled on Rinvoq .  She denies any active inflammation at this time. She is taking meloxicam  7.5 mg twice daily and Tylenol  3 times a day for symptomatic relief. She denies any recent or recurrent infections.    Activities of Daily Living:  Patient denies any morning stiffness Patient Reports nocturnal pain.  Difficulty dressing/grooming: Denies Difficulty climbing stairs: Reports Difficulty getting out of chair: Denies Difficulty  using hands for taps, buttons, cutlery, and/or writing: Reports  Review of Systems  Constitutional:  Negative for fatigue.  HENT:  Negative for mouth sores and mouth dryness.   Eyes:  Negative for dryness.  Respiratory:  Negative for shortness of breath.   Cardiovascular:  Negative for chest pain and palpitations.  Gastrointestinal:  Positive for constipation. Negative for blood in stool and diarrhea.  Endocrine: Negative for increased urination.  Genitourinary:  Negative for involuntary urination.  Musculoskeletal:  Positive for gait problem. Negative for joint pain, joint pain, joint swelling, myalgias, muscle weakness, morning stiffness, muscle tenderness and myalgias.  Skin:  Negative for color change, rash, hair loss and sensitivity to sunlight.  Allergic/Immunologic: Negative for susceptible to infections.  Neurological:  Negative for dizziness and headaches.  Hematological:  Negative for swollen glands.  Psychiatric/Behavioral:  Negative for depressed mood and sleep disturbance. The patient is not nervous/anxious.     PMFS History:  Patient Active Problem List   Diagnosis Date Noted   Cervical stenosis of spinal canal 10/01/2023   Lumbar stenosis with neurogenic claudication 10/01/2023   Failed total knee, left, initial encounter (HCC) 04/11/2018   Degenerative arthritis of right knee 05/10/2017   Degenerative joint disease of right knee 05/10/2017   Rheumatoid arthritis (HCC)     Past Medical History:  Diagnosis Date   Arthritis    RA   ASCUS of cervix with negative high risk HPV 10/2017   High risk HPV infection 03/2012   HSV (herpes simplex virus) anogenital infection 12/2016  LGSIL (low grade squamous intraepithelial dysplasia) 2013/2017   03/2012 positive high risk HPV,  09/2015 with negative high-risk HPV   Rheumatoid arthritis(714.0)    Spinal stenosis     Family History  Problem Relation Age of Onset   Heart disease Father        heart attack at age 44    Diverticulitis Father    Cancer Father        Bladder    Healthy Brother    Rheum arthritis Maternal Aunt    Heart failure Maternal Grandmother    Cancer Maternal Grandfather        ? type   Diabetes Paternal Grandmother    Heart disease Paternal Grandfather    Healthy Son    Healthy Daughter    Autism Daughter    Anxiety disorder Daughter    ADD / ADHD Daughter    Past Surgical History:  Procedure Laterality Date   ANTERIOR / POSTERIOR COMBINED FUSION CERVICAL SPINE  01/24/2024   CERVICAL SPINE SURGERY     08/1992 x2 surgeries 1 week apart.   feet reconstruction surgery     bilateral    I & D KNEE WITH POLY EXCHANGE Left 04/11/2018   Procedure: REVISION LEFT TOTAL KNEE- POLY EXCHANGE;  Surgeon: Adonica Hoose, MD;  Location: [facility] OR;  Service: Orthopedics;  Laterality: Left;  Needs RNFA   LUMBAR FUSION  02/2023   TONSILLECTOMY  age 25   TOTAL HIP ARTHROPLASTY Right 12/2020   TOTAL HIP ARTHROPLASTY Left 12/2022   TOTAL KNEE ARTHROPLASTY Left 03/22/2017   Procedure: TOTAL KNEE ARTHROPLASTY w/ Navigation;  Surgeon: Adonica Hoose, MD;  Location: [facility] OR;  Service: Orthopedics;  Laterality: Left;   TOTAL KNEE ARTHROPLASTY Right 05/10/2017   Procedure: TOTAL KNEE ARTHROPLASTY;  Surgeon: Adonica Hoose, MD;  Location: [facility] OR;  Service: Orthopedics;  Laterality: Right;   TOTAL KNEE REVISION Left 07/2020   level 3   Social History   Social History Narrative   ** Merged History Encounter **       Immunization History  Administered Date(s) Administered   Influenza,inj,Quad PF,6+ Mos 08/01/2015   PFIZER(Purple Top)SARS-COV-2 Vaccination 01/10/2020, 01/27/2020, 06/06/2020     Objective: Vital Signs: BP 117/82 (BP Location: Left Arm, Patient Position: Sitting, Cuff Size: Normal)   Pulse 79   Resp 16   Ht 5' 1 (1.549 m)   Wt 185 lb 6.4 oz (84.1 kg)   LMP 04/06/2018   BMI 35.03 kg/m    Physical Exam Vitals and nursing note reviewed.  Constitutional:      Appearance: She  is well-developed.  HENT:     Head: Normocephalic and atraumatic.   Eyes:     Conjunctiva/sclera: Conjunctivae normal.    Cardiovascular:     Rate and Rhythm: Normal rate and regular rhythm.     Heart sounds: Normal heart sounds.  Pulmonary:     Effort: Pulmonary effort is normal.     Breath sounds: Normal breath sounds.  Abdominal:     General: Bowel sounds are normal.     Palpations: Abdomen is soft.   Musculoskeletal:     Cervical back: Normal range of motion.  Lymphadenopathy:     Cervical: No cervical adenopathy.   Skin:    General: Skin is warm and dry.     Capillary Refill: Capillary refill takes less than 2 seconds.   Neurological:     Mental Status: She is alert and oriented to person, place, and time.   Psychiatric:  Behavior: Behavior normal.      Musculoskeletal Exam: Patient remained seated during examination today.  C-spine has limited range of motion with lateral rotation .  Shoulder joints have good range of motion.  Flexion contractures noted in both elbows with synovial thickening bilaterally.  Some tenderness along the right elbow joint line.  Limited flexion and extension of both wrist joints with synovial thickening.  Flexion contractures of all MCP joints with ulnar deviation. Synovial thickening of all MCP joints.  Bilateral knee replacements have good ROM.   CDAI Exam: CDAI Score: -- Patient Global: --; Provider Global: -- Swollen: --; Tender: -- Joint Exam 04/03/2024   No joint exam has been documented for this visit   There is currently no information documented on the homunculus. Go to the Rheumatology activity and complete the homunculus joint exam.  Investigation: No additional findings.  Imaging: No results found.  Recent Labs: Lab Results  Component Value Date   WBC 7.1 12/30/2023   HGB 13.7 12/30/2023   PLT 317 12/30/2023   NA 140 12/30/2023   K 4.9 12/30/2023   CL 104 12/30/2023   CO2 27 12/30/2023   GLUCOSE 77  12/30/2023   BUN 13 12/30/2023   CREATININE 0.63 12/30/2023   BILITOT 0.3 12/30/2023   ALKPHOS 61 04/29/2017   AST 15 12/30/2023   ALT 13 12/30/2023   PROT 7.0 12/30/2023   ALBUMIN 3.5 04/29/2017   CALCIUM 9.8 12/30/2023   GFRAA >60 04/12/2018   QFTBGOLDPLUS NEGATIVE 08/30/2023    Speciality Comments: Methotrexate: Subcutaneous-inadequate response Leflunomide-diarrhea Enbrel, Humira, Orencia, Cimzia inadequate response, Remicade -anaphylaxis, Xeljanz-inadequate response Rinvoq -09/03/23  Procedures:  No procedures performed Allergies: Fentanyl , Remicade [infliximab], Arava [leflunomide], Cymbalta [duloxetine hcl], Dilaudid  [hydromorphone  hcl], Surgical lubricant, and Codeine   Assessment / Plan:     Visit Diagnoses: Rheumatoid arthritis involving multiple sites with positive rheumatoid factor (HCC) - +RF, +anti-CCP, +ANA, elevated ESR, severe end-stage rheumatoid arthritis: She has no active synovitis on examination today.  Her rheumatoid arthritis has been well-controlled taking Rinvoq  15 mg 1 tablet by mouth daily.  She continues to tolerate rinvoq  without any side effects.  She recently underwent cervical spinal fusion on 01/24/24-she held rinvoq  for 1 week prior to surgery and 1 week after surgery but has otherwise not had any other gaps in therapy.  Patient is currently going to physical therapy and Occupational Therapy after undergoing a cervical spinal fusion on 01/24/2024.  Hand braces for both hands were provided to help correct her ulnar deviation and loss of extension of MCP joints involving both hands.  She has noticed increased soreness involving the extensor tendons of both hands while wearing the braces.  Offered a referral to a hand specialist for further evaluation but she would like to hold off at this time.  She has not been experiencing any pain in her hands if she is not wearing the braces.  No medication changes will be made at this time. She will notify us  if she develops  any new or worsening symptoms.  She will follow up in 3 months or sooner if needed.   High risk medication use - Rinvoq  15 mg 1 tablet daily--started 09/03/2023. Previous tx: MTX, Arava, Enbrel, remicade, Humira, orencia, xeljanz, Cimzia, rinvoq .  CBC and BMP updated on 01/26/24.  Orders for CBC and CMP were released today.  Her next lab work will be due in September and every 3 months to monitor for drug toxicity. TB gold negative on 08/30/23.  Lipid panel within  normal limits on 12/30/2023. No recent or recurrent infections.  Discussed the importance of holding rinvoq  if she develops signs or symptoms of an infection and to resume once the infection has completely cleared.  - Plan: CBC with Differential/Platelet, Comprehensive metabolic panel with GFR  Arthritis of carpometacarpal [facility]) joint of both thumbs  H/O bilateral hip replacements - - RTHR 2022, LTHR 12/2022 by Dr. Cecilia Coe in [place]. Doing well.    Status post total knee replacement, bilateral - - RTKR 2018, LTKR 2018, 2019,2021 Dr. Charol Copas, surgery 2021 was by Dr. Cecilia Coe in [place]. Doing well.  Chronic pain of both ankles: Thickening of the left ankle noted.  No synovitis noted.    Pain in both feet - Bilateral feet reconstruction surgery in 2009 at [facility].  X-rays obtained at the last visit were suggestive of severe erosive RA and OA overlap.  She was recently evaluated by podiatry to discuss the need for repeat surgical intervention.  According to the patient her most recent podiatrist recommended a second opinion to discuss the surgical intervention approach.  Cervical stenosis of spinal canal - S/P fusion and  laminectomy 11/23 in [place].  Patient was evaluated by Dr. Waymond Hailey. Patient underwent cervical fusion on 01/24/24-C1-T3 per patient.  Doing well.  No complications.  Her symptoms have improved significantly.   Lumbar stenosis with neurogenic claudication - - S/p fusion  02/24/23 in [place].  Planning to proceed with  further surgical intervention in Fall 2025.    Vitamin D  deficiency  History of vertebral fracture - 1st thoracic vertebrae s/p c-fusion. 12/2022: s/p ACDF, now c/b hardware loosening and T1 vertebral body fracture, anticipating revision-Dr. Retha Cast.  Other medical conditions are listed as follows:  History of herpes genitalis  Former smoker  Family history of rheumatoid arthritis-maternal great aunt  High triglycerides: Triglycerides were within normal limits on 12/30/2023.  Orders: Orders Placed This Encounter  Procedures   CBC with Differential/Platelet   Comprehensive metabolic panel with GFR   No orders of the defined types were placed in this encounter.    Follow-Up Instructions: Return in about 5 months (around 09/03/2024) for Rheumatoid arthritis.   Romayne Clubs, PA-C  Note - This record has been created using Dragon software.  Chart creation errors have been sought, but may not always  have been located. Such creation errors do not reflect on  the standard of medical care.

## 2024-03-23 ENCOUNTER — Encounter: Admitting: Occupational Therapy

## 2024-03-23 DIAGNOSIS — M7741 Metatarsalgia, right foot: Secondary | ICD-10-CM | POA: Diagnosis not present

## 2024-03-23 DIAGNOSIS — M2041 Other hammer toe(s) (acquired), right foot: Secondary | ICD-10-CM | POA: Diagnosis not present

## 2024-03-23 DIAGNOSIS — M2012 Hallux valgus (acquired), left foot: Secondary | ICD-10-CM | POA: Diagnosis not present

## 2024-03-23 DIAGNOSIS — M2011 Hallux valgus (acquired), right foot: Secondary | ICD-10-CM | POA: Diagnosis not present

## 2024-03-23 DIAGNOSIS — M2042 Other hammer toe(s) (acquired), left foot: Secondary | ICD-10-CM | POA: Diagnosis not present

## 2024-03-23 DIAGNOSIS — M7742 Metatarsalgia, left foot: Secondary | ICD-10-CM | POA: Diagnosis not present

## 2024-03-28 ENCOUNTER — Ambulatory Visit: Admitting: Occupational Therapy

## 2024-03-28 DIAGNOSIS — M0579 Rheumatoid arthritis with rheumatoid factor of multiple sites without organ or systems involvement: Secondary | ICD-10-CM

## 2024-03-28 DIAGNOSIS — M6249 Contracture of muscle, multiple sites: Secondary | ICD-10-CM

## 2024-03-28 DIAGNOSIS — M6281 Muscle weakness (generalized): Secondary | ICD-10-CM

## 2024-03-28 DIAGNOSIS — R278 Other lack of coordination: Secondary | ICD-10-CM

## 2024-03-28 NOTE — Therapy (Unsigned)
 OUTPATIENT OCCUPATIONAL THERAPY NEURO TREATMENT Patient Name: Caitlin Wagner MRN: [id number] DOB:[birth year], 58 y.o., female Today's Date: 03/28/2024  PCP: Dr. Luster Salters (rheumatologist), Stamey, Lowell Rude, FNP (PCP)  REFERRING PROVIDER: Staci Dykes, MD   END OF SESSION:  OT End of Session - 03/28/24 1859     Visit Number 12    Number of Visits 16    Date for OT Re-Evaluation 04/07/24    Authorization Type UHC UMR/UHC Medicare 2025 Covered 100%    Authorization Time Period VL: [place] Auth Required after 25th visit ([facility])    OT Start Time 1619    OT Stop Time 1701    OT Time Calculation (min) 42 min    Activity Tolerance Patient tolerated treatment well    Behavior During Therapy [facility] for tasks assessed/performed             Past Medical History:  Diagnosis Date   Arthritis    RA   ASCUS of cervix with negative high risk HPV 10/2017   High risk HPV infection 03/2012   HSV (herpes simplex virus) anogenital infection 12/2016   LGSIL (low grade squamous intraepithelial dysplasia) 2013/2017   03/2012 positive high risk HPV,  09/2015 with negative high-risk HPV   Rheumatoid arthritis(714.0)    Spinal stenosis    Past Surgical History:  Procedure Laterality Date   CERVICAL SPINE SURGERY     08/1992 x2 surgeries 1 week apart.   feet reconstruction surgery     bilateral    I & D KNEE WITH POLY EXCHANGE Left 04/11/2018   Procedure: REVISION LEFT TOTAL KNEE- POLY EXCHANGE;  Surgeon: Adonica Hoose, MD;  Location: [facility];  Service: Orthopedics;  Laterality: Left;  Needs RNFA   LUMBAR FUSION  02/2023   TONSILLECTOMY  age 50   TOTAL HIP ARTHROPLASTY Right 12/2020   TOTAL HIP ARTHROPLASTY Left 12/2022   TOTAL KNEE ARTHROPLASTY Left 03/22/2017   Procedure: TOTAL KNEE ARTHROPLASTY w/ Navigation;  Surgeon: Adonica Hoose, MD;  Location: [facility] OR;  Service: Orthopedics;  Laterality: Left;   TOTAL KNEE ARTHROPLASTY Right 05/10/2017   Procedure: TOTAL KNEE ARTHROPLASTY;   Surgeon: Adonica Hoose, MD;  Location: [facility] OR;  Service: Orthopedics;  Laterality: Right;   TOTAL KNEE REVISION Left 07/2020   level 3   Patient Active Problem List   Diagnosis Date Noted   Cervical stenosis of spinal canal 10/01/2023   Lumbar stenosis with neurogenic claudication 10/01/2023   Failed total knee, left, initial encounter (HCC) 04/11/2018   Degenerative arthritis of right knee 05/10/2017   Degenerative joint disease of right knee 05/10/2017   Rheumatoid arthritis (HCC)     ONSET DATE: 02/01/2024 (referral date)   REFERRING DIAG: S/P cervical spinal fusion   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   Pt accompanied by: self   PERTINENT HISTORY: RA, s/p fusion and laminectomy from C1-T3 and a muscle overlay in the back on 01/24/2024, numerous surgeries related to RA   PRECAUTIONS: Cervical and Fall (RA - will need lumbar surgery and RA ulnar drift in UE digits)   WEIGHT BEARING RESTRICTIONS: Yes no lifting over 10 lb   PAIN:  Are you having pain? No and  Yes: NPRS scale: 6/10 Pain location: low back  FALLS: Has patient fallen in last 6 months? Yes. 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  HAND DOMINANCE: Right  ADLs:  Eating: leans into UE to feed self Grooming: husband puts hair up and brushes hair (pt afraid she will hit her incision with a brush); generally mod I UB Dressing: max to total A LB Dressing: max A Toileting: mod I Bathing: mod A Tub Shower transfers: min A  IADLs: Shopping: online - husband picks up Light housekeeping: has paid cleaner that comes in 1 x month Meal Prep: dependent Community mobility: dependent Medication management: mod I Handwriting: no recent changes  MOBILITY STATUS: Independent with SPC  ACTIVITY TOLERANCE: Activity tolerance: fair  FUNCTIONAL OUTCOME MEASURES: 02/17/24: PSFS = 2.3  UPPER EXTREMITY ROM:     AROM Right (eval) Left (eval) Right (5/29)  Shoulder flexion 45* WFL 120*  Shoulder abduction 45* WFL 45*  Elbow flexion [facility] WFL   Elbow extension [facility] WFL   Wrist flexion [facility] WFL   Wrist extension WFL WFL   Forearm pronation WFL WFL   Forearm supination 60* WFL WFL  Digit Composite Flexion [facility] WFL with exception to thumb (goes to middle of palm)    Digit Composite Extension Lacks extension at MCP (mild hyperextension at 4th and 5th PIPs) Lacks extension at MCP (hyperextension at 4th and 5th PIPs)   Digit Opposition To 2nd and 3rd digits only To 2nd digit only   (Blank rows = not tested)  UPPER EXTREMITY MMT:     BUE: BFL  HAND FUNCTION: 02/17/24 Grip strength: Right: 12.7, 15.6  lbs; Left: 11.2, 13.0 lbs Average Right: 14.2 lbs Left 12.1 lbs  COORDINATION: 02/17/24 9 Hole Peg test: Right: 29.06 sec; Left: 28.97 sec  SENSATION: WFL  EDEMA: Edema  with RA, better controlled with current medications  MUSCLE TONE: BUE - slight hypotonicity in hands  COGNITION: Overall cognitive status: Within functional limits for tasks assessed  VISION: Subjective report: no changes Baseline vision: Wears glasses all the time  VISION ASSESSMENT: WFL  PERCEPTION: Not tested  PRAXIS: Not tested  OBSERVATIONS: Pt appears well-kept. Uses SPC for slow, altered gait though no LOB. Ulnar drifting of B hands with limited MCP extension.  TODAY'S TREATMENT :   Surveyor, mining and Train:  OT adjusted R and L resting hand splints to assure better positioning, especially of L thumb at MCP given tendency for hyperextension. Married middle straps together to lessen number of fasteners. Pt demonstrating ability to doff and don requiring increased time. Reviewed wearing schedule recommending wear at rest/nighttime.  PATIENT EDUCATION: Education details: B resting hand splints Person educated: Patient Education method: Explanation, Demonstration, and Verbal cues Education comprehension: verbalized understanding, returned demonstration, verbal cues required, and needs further education  HOME EXERCISE PROGRAM: 02/17/24: Sleep positions/Return to driving  05/19/1913: dowel HEP  GOALS:  SHORT TERM GOALS: Target date: 03/10/2024  Pt to be independent with B splint wear and care. Baseline: New to outpt OT Goal status: MET  2.  Pt will independently recall at least 3 joint protection, ergonomics, and body mechanic principles as noted in pt instructions.   Baseline: New to outpt OT Goal status: MET  LONG TERM GOALS: Target date: 04/07/2024    Patient will demonstrate updated BUE HEP with visual handouts only for proper execution. Baseline: New to outpt OT Goal status: IN Progress  2.  Patient will have adequate hand strength and coordination to  use modified techniques and adaptive equipment for hobbies, cooking and self care AND no loss of strength or coordination via standardized tests.  Baseline: Grip Right: 14.2 lbs Left 12.1 lbs AND 9 Hole Peg test: Right: 29.06 sec; Left: 28.97 sec Goal status: IN Progress  3.  Patient will report at least two-point increase in average PSFS score or at least three-point increase in a single activity score indicating functionally significant improvement given minimum detectable change. Baseline: 2.3 total score (See above for individual activity scores.  Goal status: IN Progress  ASSESSMENT:  CLINICAL IMPRESSION: Patient has met all STGs this date demonstrating improved positioning of B hands with prefabricated resting hand splints with improved comfort. Will progress towards remaining goals as written.  PERFORMANCE DEFICITS: in functional skills including ADLs, IADLs, coordination, ROM, strength, pain, Fine motor control, Gross motor control, mobility, endurance, decreased knowledge of precautions, decreased knowledge of use of DME, and UE functional use.   IMPAIRMENTS: are limiting patient from ADLs, IADLs, rest and sleep, and leisure.   CO-MORBIDITIES: has co-morbidities such as RA and pending spinal surgery that affects occupational performance. Patient will benefit from skilled OT to address above impairments and improve overall function.  REHAB POTENTIAL: Fair given chronicity and extent of BUE impairments  PLAN:  OT FREQUENCY: 2x/week  OT DURATION: 8 weeks  PLANNED INTERVENTIONS: 97168 OT Re-evaluation, 97535 self care/ADL training, 78295 therapeutic exercise, 97530 therapeutic activity, 97112 neuromuscular re-education, 97140 manual therapy, 97113 aquatic therapy, 97035 ultrasound, 97018 paraffin, 62130 fluidotherapy, 97010 moist heat, 97032 electrical stimulation (manual), 97760 Orthotic Initial, 97763 Orthotic/Prosthetic subsequent, passive range of motion, functional mobility  training, energy conservation, coping strategies training, patient/family education, and DME and/or AE instructions  RECOMMENDED OTHER SERVICES: N/A for this visit  CONSULTED AND AGREED WITH PLAN OF CARE: Patient  PLAN FOR NEXT SESSION: review resting hand splint don and doff, check positioning  Daily noprene braces?  - consider reduction in frequency   Review golf solitaire as needed Tendon glides in splint or glove (improved MCP alignment first)  Paraffin trials as needed Review taping as needed Crochet adaptations  Games for pt and daughter (simple ie) trash dice was good -no timed/competitive tasks  [employer], OT 03/28/2024, 7:00 PM

## 2024-03-30 ENCOUNTER — Ambulatory Visit: Admitting: Occupational Therapy

## 2024-03-30 DIAGNOSIS — M6281 Muscle weakness (generalized): Secondary | ICD-10-CM | POA: Diagnosis not present

## 2024-03-30 DIAGNOSIS — M0579 Rheumatoid arthritis with rheumatoid factor of multiple sites without organ or systems involvement: Secondary | ICD-10-CM

## 2024-03-30 DIAGNOSIS — M6249 Contracture of muscle, multiple sites: Secondary | ICD-10-CM

## 2024-03-30 DIAGNOSIS — R252 Cramp and spasm: Secondary | ICD-10-CM

## 2024-03-30 DIAGNOSIS — R278 Other lack of coordination: Secondary | ICD-10-CM

## 2024-03-30 NOTE — Therapy (Signed)
 OUTPATIENT OCCUPATIONAL THERAPY NEURO TREATMENT Patient Name: Caitlin Wagner MRN: [id number] DOB:Feb 11, 1966, 58 y.o., female Today's Date: 03/30/2024  PCP: Dr. Luster Salters (rheumatologist), Stamey, Lowell Rude, FNP (PCP)  REFERRING PROVIDER: Staci Dykes, MD   END OF SESSION:  OT End of Session - 03/30/24 1612     Visit Number 13    Number of Visits 16    Date for OT Re-Evaluation 04/07/24    Authorization Type UHC UMR/UHC Medicare 2025 Covered 100%    Authorization Time Period VL: [place] Auth Required after 25th visit ([facility])    OT Start Time 1605    OT Stop Time 1646    OT Time Calculation (min) 41 min    Activity Tolerance Patient tolerated treatment well    Behavior During Therapy [facility] for tasks assessed/performed          Past Medical History:  Diagnosis Date   Arthritis    RA   ASCUS of cervix with negative high risk HPV 10/2017   High risk HPV infection 03/2012   HSV (herpes simplex virus) anogenital infection 12/2016   LGSIL (low grade squamous intraepithelial dysplasia) 2013/2017   03/2012 positive high risk HPV,  09/2015 with negative high-risk HPV   Rheumatoid arthritis(714.0)    Spinal stenosis    Past Surgical History:  Procedure Laterality Date   CERVICAL SPINE SURGERY     08/1992 x2 surgeries 1 week apart.   feet reconstruction surgery     bilateral    I & D KNEE WITH POLY EXCHANGE Left 04/11/2018   Procedure: REVISION LEFT TOTAL KNEE- POLY EXCHANGE;  Surgeon: Adonica Hoose, MD;  Location: [facility] OR;  Service: Orthopedics;  Laterality: Left;  Needs RNFA   LUMBAR FUSION  02/2023   TONSILLECTOMY  age 73   TOTAL HIP ARTHROPLASTY Right 12/2020   TOTAL HIP ARTHROPLASTY Left 12/2022   TOTAL KNEE ARTHROPLASTY Left 03/22/2017   Procedure: TOTAL KNEE ARTHROPLASTY w/ Navigation;  Surgeon: Adonica Hoose, MD;  Location: [facility] OR;  Service: Orthopedics;  Laterality: Left;   TOTAL KNEE ARTHROPLASTY Right 05/10/2017   Procedure: TOTAL KNEE ARTHROPLASTY;  Surgeon:  Adonica Hoose, MD;  Location: [facility] OR;  Service: Orthopedics;  Laterality: Right;   TOTAL KNEE REVISION Left 07/2020   level 3   Patient Active Problem List   Diagnosis Date Noted   Cervical stenosis of spinal canal 10/01/2023   Lumbar stenosis with neurogenic claudication 10/01/2023   Failed total knee, left, initial encounter (HCC) 04/11/2018   Degenerative arthritis of right knee 05/10/2017   Degenerative joint disease of right knee 05/10/2017   Rheumatoid arthritis (HCC)     ONSET DATE: 02/01/2024 (referral date)   REFERRING DIAG: S/P cervical spinal fusion   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 She reports L thumb is really comfortable in the resting hand splint. Inquires what options there may be for her wrists and potentially thumb.   Pt accompanied by: self   PERTINENT HISTORY:  RA, s/p fusion and laminectomy from C1-T3 and a muscle overlay in the back on 01/24/2024, numerous surgeries related to RA   PRECAUTIONS: Cervical and Fall (RA - will need lumbar surgery and RA ulnar drift in UE digits)   WEIGHT BEARING RESTRICTIONS: Yes no lifting over 10 lb   PAIN:  Are you having pain? No and Yes: NPRS scale: 6/10 Pain location: low back  FALLS: Has patient fallen in last 6 months? Yes. 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  HAND DOMINANCE: Right  ADLs:  Eating: leans into UE to feed self Grooming: husband puts hair up and brushes hair (pt afraid she will hit her incision with a brush); generally mod I UB Dressing: max to total A LB Dressing: max A Toileting: mod I Bathing: mod A Tub Shower transfers: min A  IADLs: Shopping: online - husband picks up Light housekeeping: has paid cleaner that comes in 1 x month Meal Prep: dependent Community mobility: dependent Medication management: mod I Handwriting: no recent changes  MOBILITY STATUS: Independent with SPC  ACTIVITY TOLERANCE: Activity tolerance: fair  FUNCTIONAL OUTCOME MEASURES: 02/17/24: PSFS = 2.3   03/30/2024:  PSFS = 8.7 total score   Total score = sum of the activity scores/number of activities Minimum detectable change (90%CI) for average score = 2 points Minimum detectable change (90%CI) for single activity score = 3 points  UPPER EXTREMITY ROM:     AROM Right (eval) Left (eval) Right (5/29)  Shoulder flexion 45* WFL 120*  Shoulder abduction 45* WFL 45*  Elbow flexion [facility] WFL   Elbow extension [facility] WFL   Wrist flexion [facility] WFL   Wrist extension [facility] WFL   Forearm pronation WFL WFL   Forearm supination 60* WFL  WFL  Digit Composite Flexion [facility] WFL with exception to thumb (goes to middle of palm)    Digit Composite Extension Lacks extension at MCP (mild hyperextension at 4th and 5th PIPs) Lacks extension at MCP (hyperextension at 4th and 5th PIPs)   Digit Opposition To 2nd and 3rd digits only To 2nd digit only   (Blank rows = not tested)  UPPER EXTREMITY MMT:     BUE: BFL  HAND FUNCTION: 02/17/24 Grip strength: Right: 12.7, 15.6  lbs; Left: 11.2, 13.0 lbs Average Right: 14.2 lbs Left 12.1 lbs  COORDINATION: 02/17/24 9 Hole Peg test: Right: 29.06 sec; Left: 28.97 sec  SENSATION: WFL  EDEMA: Edema with RA, better controlled with current medications  MUSCLE TONE: BUE - slight hypotonicity in hands  COGNITION: Overall cognitive status: Within functional limits for tasks assessed  VISION: Subjective report: no changes Baseline vision: Wears glasses all the time  VISION ASSESSMENT: WFL  PERCEPTION: Not tested  PRAXIS: Not tested  OBSERVATIONS: Pt appears  well-kept. Uses SPC for slow, altered gait though no LOB. Ulnar drifting of B hands with limited MCP extension.                                                                                                                    TODAY'S TREATMENT :    - Self-care/home management completed for duration as noted below including:  OT and pt discussed need to adjust resting hand splints including adjusting the pan to prevent cross friction at MCPs, not tightening straps as much, applying heat to forearms for 10 min prior to brace removal, allowing time for body to adjust, and getting input from rheumatology.   OT discussed wrist brace options including neoprene to go over wrist and provide support at webspace of thumb. Discouraged rigid bracing, thumb hole with binding, or spica that extends up past MCP or thumb given anatomical changes to this area and to ensure comfort while also providing desired support. Will discuss options in further  detail after pt has visit with Rheumatology on Monday.   PATIENT EDUCATION: Education details: Goal progression; additional splinting options/modifications of resting hand splints Person educated: Patient Education method: Explanation, Demonstration, and Verbal cues Education comprehension: verbalized understanding, returned demonstration, verbal cues required, and needs further education  HOME EXERCISE PROGRAM: 02/17/24: Sleep positions/Return to driving  01/21/4097: dowel HEP  GOALS:  SHORT TERM GOALS: MET 2/2  LONG TERM GOALS: Target date: 04/07/2024    Patient will demonstrate updated BUE HEP with visual handouts only for proper execution. Baseline: New to outpt OT Goal status: IN Progress  2.  Patient will have adequate hand strength and coordination to use modified techniques and adaptive equipment for hobbies, cooking and self care AND no loss of strength or coordination via standardized tests.  Baseline: Grip Right: 14.2 lbs Left 12.1 lbs AND 9 Hole Peg test: Right: 29.06 sec; Left: 28.97 sec Goal status: IN Progress  3.  Patient will report at least two-point increase in average PSFS score or at least three-point increase in a single activity score indicating functionally significant improvement given minimum detectable change. Baseline: 2.3 total score (See above for individual activity scores.  03/30/2024: 8.7 total score Goal status: MET  ASSESSMENT:  CLINICAL IMPRESSION: Patient has met PSFS goal this date. Per subjective, will continue to assess best splinting options with consideration of rheumatology feedback.   PERFORMANCE DEFICITS: in functional skills including ADLs, IADLs, coordination, ROM, strength, pain, Fine motor control, Gross motor control, mobility, endurance, decreased knowledge of precautions, decreased knowledge of use of DME, and UE functional use.   IMPAIRMENTS: are limiting patient from ADLs, IADLs, rest and sleep, and leisure.   CO-MORBIDITIES:  has co-morbidities such as RA and pending spinal surgery that affects occupational performance. Patient will benefit from skilled OT to address above impairments and improve overall function.  REHAB POTENTIAL: Fair given chronicity and extent of BUE impairments  PLAN:  OT FREQUENCY: 2x/week  OT DURATION: 8 weeks  PLANNED INTERVENTIONS: 97168 OT Re-evaluation, 97535 self  care/ADL training, 04540 therapeutic exercise, 97530 therapeutic activity, 97112 neuromuscular re-education, 97140 manual therapy, [id number] aquatic therapy, 97035 ultrasound, 97018 paraffin, 98119 fluidotherapy, 97010 moist heat, 97032 electrical stimulation (manual), 97760 Orthotic Initial, 97763 Orthotic/Prosthetic subsequent, passive range of motion, functional mobility training, energy conservation, coping strategies training, patient/family education, and DME and/or AE instructions  RECOMMENDED OTHER SERVICES: N/A for this visit  CONSULTED AND AGREED WITH PLAN OF CARE: Patient  PLAN FOR NEXT SESSION: review resting hand splint don and doff, check positioning - if she brings  Daily noprene braces? Modified thumb spica with wrist support?  - consider reduction in frequency   Review golf solitaire as needed Tendon glides in splint or glove (improved MCP alignment first)  Paraffin trials as needed Review taping as needed Crochet adaptations  Games for pt and daughter (simple ie) trash dice was good -no timed/competitive tasks  Altamease Asters, OT 03/30/2024, 4:13 PM

## 2024-04-03 ENCOUNTER — Encounter: Payer: Self-pay | Admitting: Physician Assistant

## 2024-04-03 ENCOUNTER — Ambulatory Visit: Attending: Physician Assistant | Admitting: Physician Assistant

## 2024-04-03 VITALS — BP 117/82 | HR 79 | Resp 16 | Ht 61.0 in | Wt 185.4 lb

## 2024-04-03 DIAGNOSIS — Z79899 Other long term (current) drug therapy: Secondary | ICD-10-CM

## 2024-04-03 DIAGNOSIS — M180 Bilateral primary osteoarthritis of first carpometacarpal joints: Secondary | ICD-10-CM | POA: Diagnosis not present

## 2024-04-03 DIAGNOSIS — M0579 Rheumatoid arthritis with rheumatoid factor of multiple sites without organ or systems involvement: Secondary | ICD-10-CM | POA: Diagnosis not present

## 2024-04-03 DIAGNOSIS — Z96643 Presence of artificial hip joint, bilateral: Secondary | ICD-10-CM

## 2024-04-03 DIAGNOSIS — E781 Pure hyperglyceridemia: Secondary | ICD-10-CM

## 2024-04-03 DIAGNOSIS — G8929 Other chronic pain: Secondary | ICD-10-CM

## 2024-04-03 DIAGNOSIS — M4802 Spinal stenosis, cervical region: Secondary | ICD-10-CM

## 2024-04-03 DIAGNOSIS — Z8261 Family history of arthritis: Secondary | ICD-10-CM

## 2024-04-03 DIAGNOSIS — M791 Myalgia, unspecified site: Secondary | ICD-10-CM

## 2024-04-03 DIAGNOSIS — M25571 Pain in right ankle and joints of right foot: Secondary | ICD-10-CM

## 2024-04-03 DIAGNOSIS — Z8781 Personal history of (healed) traumatic fracture: Secondary | ICD-10-CM

## 2024-04-03 DIAGNOSIS — Z87891 Personal history of nicotine dependence: Secondary | ICD-10-CM

## 2024-04-03 DIAGNOSIS — M25572 Pain in left ankle and joints of left foot: Secondary | ICD-10-CM

## 2024-04-03 DIAGNOSIS — Z96653 Presence of artificial knee joint, bilateral: Secondary | ICD-10-CM

## 2024-04-03 DIAGNOSIS — E559 Vitamin D deficiency, unspecified: Secondary | ICD-10-CM

## 2024-04-03 DIAGNOSIS — Z8619 Personal history of other infectious and parasitic diseases: Secondary | ICD-10-CM

## 2024-04-03 DIAGNOSIS — M79671 Pain in right foot: Secondary | ICD-10-CM

## 2024-04-03 DIAGNOSIS — M48062 Spinal stenosis, lumbar region with neurogenic claudication: Secondary | ICD-10-CM

## 2024-04-03 DIAGNOSIS — M79672 Pain in left foot: Secondary | ICD-10-CM

## 2024-04-04 ENCOUNTER — Ambulatory Visit: Payer: Self-pay | Admitting: Physician Assistant

## 2024-04-04 ENCOUNTER — Ambulatory Visit: Admitting: Occupational Therapy

## 2024-04-04 DIAGNOSIS — M0579 Rheumatoid arthritis with rheumatoid factor of multiple sites without organ or systems involvement: Secondary | ICD-10-CM

## 2024-04-04 DIAGNOSIS — R252 Cramp and spasm: Secondary | ICD-10-CM

## 2024-04-04 DIAGNOSIS — M6281 Muscle weakness (generalized): Secondary | ICD-10-CM | POA: Diagnosis not present

## 2024-04-04 DIAGNOSIS — M6249 Contracture of muscle, multiple sites: Secondary | ICD-10-CM

## 2024-04-04 DIAGNOSIS — R278 Other lack of coordination: Secondary | ICD-10-CM

## 2024-04-04 LAB — COMPREHENSIVE METABOLIC PANEL WITH GFR
AG Ratio: 2 (calc) (ref 1.0–2.5)
ALT: 10 U/L (ref 6–29)
AST: 14 U/L (ref 10–35)
Albumin: 4.6 g/dL (ref 3.6–5.1)
Alkaline phosphatase (APISO): 67 U/L (ref 37–153)
BUN: 11 mg/dL (ref 7–25)
CO2: 26 mmol/L (ref 20–32)
Calcium: 9.7 mg/dL (ref 8.6–10.4)
Chloride: 106 mmol/L (ref 98–110)
Creat: 0.6 mg/dL (ref 0.50–1.03)
Globulin: 2.3 g/dL (ref 1.9–3.7)
Glucose, Bld: 81 mg/dL (ref 65–99)
Potassium: 4.4 mmol/L (ref 3.5–5.3)
Sodium: 142 mmol/L (ref 135–146)
Total Bilirubin: 0.3 mg/dL (ref 0.2–1.2)
Total Protein: 6.9 g/dL (ref 6.1–8.1)
eGFR: 105 mL/min/{1.73_m2} (ref >=60)

## 2024-04-04 LAB — CBC WITH DIFFERENTIAL/PLATELET
Absolute Lymphocytes: 2795 {cells}/uL (ref 850–3900)
Absolute Monocytes: 415 {cells}/uL (ref 200–950)
Basophils Absolute: 20 {cells}/uL (ref 0–200)
Basophils Relative: 0.3 %
Eosinophils Absolute: 129 {cells}/uL (ref 15–500)
Eosinophils Relative: 1.9 %
HCT: 42 % (ref 35.0–45.0)
Hemoglobin: 13.9 g/dL (ref 11.7–15.5)
MCH: 31.9 pg (ref 27.0–33.0)
MCHC: 33.1 g/dL (ref 32.0–36.0)
MCV: 96.3 fL (ref 80.0–100.0)
MPV: 9.4 fL (ref 7.5–12.5)
Monocytes Relative: 6.1 %
Neutro Abs: 3441 {cells}/uL (ref 1500–7800)
Neutrophils Relative %: 50.6 %
Platelets: 249 10*3/uL (ref 140–400)
RBC: 4.36 10*6/uL (ref 3.80–5.10)
RDW: 15.1 % — ABNORMAL HIGH (ref 11.0–15.0)
Total Lymphocyte: 41.1 %
WBC: 6.8 10*3/uL (ref 3.8–10.8)

## 2024-04-04 NOTE — Therapy (Signed)
 OUTPATIENT OCCUPATIONAL THERAPY NEURO TREATMENT  Patient Name: Caitlin Wagner MRN: [id number] DOB:1966/07/13, 58 y.o., female Today's Date: 04/04/2024  PCP: Dr. Luster Salters (rheumatologist), Stamey, Lowell Rude, FNP (PCP)  REFERRING PROVIDER: Staci Dykes, MD   END OF SESSION:  OT End of Session - 04/04/24 1621     Visit Number 14    Number of Visits 16    Date for OT Re-Evaluation 04/07/24    Authorization Type UHC UMR/UHC Medicare 2025 Covered 100%    Authorization Time Period VL: [place] Auth Required after 25th visit ([facility])    OT Start Time 1620    OT Stop Time 1700    OT Time Calculation (min) 40 min    Activity Tolerance Patient tolerated treatment well    Behavior During Therapy [facility] for tasks assessed/performed         Past Medical History:  Diagnosis Date   Arthritis    RA   ASCUS of cervix with negative high risk HPV 10/2017   High risk HPV infection 03/2012   HSV (herpes simplex virus) anogenital infection 12/2016   LGSIL (low grade squamous intraepithelial dysplasia) 2013/2017   03/2012 positive high risk HPV,  09/2015 with negative high-risk HPV   Rheumatoid arthritis(714.0)    Spinal stenosis    Past Surgical History:  Procedure Laterality Date   ANTERIOR / POSTERIOR COMBINED FUSION CERVICAL SPINE  01/24/2024   CERVICAL SPINE SURGERY     08/1992 x2 surgeries 1 week apart.   feet reconstruction surgery     bilateral    I & D KNEE WITH POLY EXCHANGE Left 04/11/2018   Procedure: REVISION LEFT TOTAL KNEE- POLY EXCHANGE;  Surgeon: Adonica Hoose, MD;  Location: [facility] OR;  Service: Orthopedics;  Laterality: Left;  Needs RNFA   LUMBAR FUSION  02/2023   TONSILLECTOMY  age 40   TOTAL HIP ARTHROPLASTY Right 12/2020   TOTAL HIP ARTHROPLASTY Left 12/2022   TOTAL KNEE ARTHROPLASTY Left 03/22/2017   Procedure: TOTAL KNEE ARTHROPLASTY w/ Navigation;  Surgeon: Adonica Hoose, MD;  Location: [facility] OR;  Service: Orthopedics;  Laterality: Left;   TOTAL KNEE  ARTHROPLASTY Right 05/10/2017   Procedure: TOTAL KNEE ARTHROPLASTY;  Surgeon: Adonica Hoose, MD;  Location: [facility] OR;  Service: Orthopedics;  Laterality: Right;   TOTAL KNEE REVISION Left 07/2020   level 3   Patient Active Problem List   Diagnosis Date Noted   Cervical stenosis of spinal canal 10/01/2023   Lumbar stenosis with neurogenic claudication 10/01/2023   Failed total knee, left, initial encounter (HCC) 04/11/2018   Degenerative arthritis of right knee 05/10/2017   Degenerative joint disease of right knee 05/10/2017   Rheumatoid arthritis (HCC)     ONSET DATE: 02/01/2024 (referral date)   REFERRING DIAG: S/P cervical spinal fusion   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 Pt forgot to bring these in today but did bring them to her appt. yesterday. She will bring these into therapy on Thursday.   Pt accompanied by: self   PERTINENT HISTORY: RA, s/p fusion and laminectomy from C1-T3 and a muscle overlay in the  back on 01/24/2024, numerous surgeries related to RA   PRECAUTIONS: Cervical and Fall (RA - will need lumbar surgery and RA ulnar drift in UE digits)   WEIGHT BEARING RESTRICTIONS: Yes no lifting over 10 lb   PAIN:  Are you having pain? No and Yes: NPRS scale: 6/10 Pain location: low back  FALLS: Has patient fallen in last 6 months? Yes. 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  HAND DOMINANCE: Right  ADLs:  Eating: leans into UE to feed self Grooming: husband puts hair up and brushes hair (pt afraid she will hit her incision with a brush); generally mod I UB Dressing: max to total A LB Dressing: max A Toileting: mod I Bathing: mod A Tub Shower transfers: min A  IADLs: Shopping: online - husband picks up Light housekeeping: has paid cleaner that comes in 1 x month Meal Prep: dependent Community mobility: dependent Medication management: mod I Handwriting: no recent changes  MOBILITY STATUS: Independent with SPC  ACTIVITY TOLERANCE: Activity tolerance: fair  FUNCTIONAL OUTCOME MEASURES: 02/17/24: PSFS = 2.3   03/30/2024:  PSFS = 8.7 total score   Total score = sum of the activity scores/number of activities Minimum detectable change (90%CI) for average score = 2 points Minimum detectable change (90%CI) for single activity score = 3 points  UPPER EXTREMITY ROM:     AROM Right (eval) Left (eval) Right (5/29)  Shoulder flexion 45* WFL 120*  Shoulder abduction 45* WFL 45*  Elbow flexion [facility] WFL   Elbow extension [facility] WFL   Wrist flexion [facility] WFL   Wrist extension [facility] WFL   Forearm pronation WFL WFL   Forearm supination 60* WFL WFL  Digit Composite Flexion [facility] WFL with exception to  thumb (goes to middle of palm)    Digit Composite Extension Lacks extension at MCP (mild hyperextension at 4th and 5th PIPs) Lacks extension at MCP (hyperextension at 4th and 5th PIPs)   Digit Opposition To 2nd and 3rd digits only To 2nd digit only   (Blank rows = not tested)  UPPER EXTREMITY MMT:     BUE: BFL  HAND FUNCTION: 02/17/24 Grip strength: Right: 12.7, 15.6  lbs; Left: 11.2, 13.0 lbs Average Right: 14.2 lbs Left 12.1 lbs  COORDINATION: 02/17/24 9 Hole Peg test: Right: 29.06 sec; Left: 28.97 sec  SENSATION: WFL  EDEMA: Edema with RA, better controlled with current medications  MUSCLE TONE: BUE - slight hypotonicity in hands  COGNITION: Overall cognitive status: Within functional limits for tasks assessed  VISION: Subjective report: no changes Baseline vision: Wears glasses all the time  VISION ASSESSMENT: WFL  PERCEPTION: Not tested  PRAXIS: Not tested  OBSERVATIONS: Pt appears well-kept. Uses SPC for slow, altered gait though no LOB. Ulnar drifting of  B hands with limited MCP extension.                                                                                                                    TODAY'S TREATMENT :    - Self-care/home management completed for duration as noted below including:  OT and pt further discussed need to adjust resting hand splints including adjusting the pan to prevent cross friction at MCPs and not tightening straps as much (this has been helpful). This is in line with recommendation from rheumatology in effort to reduce further degeneration, especially at the wrists.   OT discussed further wrist brace options including neoprene to go over wrist and provide support at webspace of thumb. Looked at options though ultimately, pt agreeable to trying fabricated wrist brace with Orficast.  Orthotic Initial fit and casting:  Using wide (black) Orficast, OT fabricated L shortened thumb spica splint with elongated palmar wrist and distal  forearm support. OT to dispense at next session with fasteners.  PATIENT EDUCATION: Education details: Splint options/fabrication Person educated: Patient Education method: Explanation, Demonstration, and Verbal cues Education comprehension: verbalized understanding, returned demonstration, verbal cues required, and needs further education  HOME EXERCISE PROGRAM: 02/17/24: Sleep positions/Return to driving  10/24/1094: dowel HEP  GOALS:  SHORT TERM GOALS: MET 2/2  LONG TERM GOALS: Target date: 04/07/2024    Patient will demonstrate updated BUE HEP with visual handouts only for proper execution. Baseline: New to outpt OT Goal status: IN Progress  2.  Patient will have adequate hand strength and coordination to use modified techniques and adaptive equipment for hobbies, cooking and self care AND no loss of strength or coordination via standardized tests.  Baseline: Grip Right: 14.2 lbs Left 12.1 lbs AND 9 Hole Peg test: Right: 29.06 sec; Left: 28.97 sec Goal status: IN Progress  3.  Patient will report at least two-point increase in average PSFS score or at least three-point increase in a single activity score indicating functionally significant improvement given minimum detectable change. Baseline: 2.3 total score (See above for individual activity scores.  03/30/2024: 8.7 total score Goal status: MET  ASSESSMENT:  CLINICAL IMPRESSION: Patient demonstrates good comfort with fabricated, shortened thumb spica (just proximal to IP) with palmar wrist and distal forearm support. Will monitor fit and need for RUE fabrication.   PERFORMANCE DEFICITS: in functional skills including ADLs, IADLs, coordination, ROM, strength, pain, Fine motor control, Gross motor control, mobility, endurance, decreased knowledge of precautions, decreased knowledge of use of DME, and UE functional use.   IMPAIRMENTS: are limiting patient from ADLs, IADLs, rest and sleep, and leisure.   CO-MORBIDITIES: has  co-morbidities such as RA and pending spinal surgery that affects occupational performance. Patient will benefit from skilled OT to address above impairments and improve overall function.  REHAB POTENTIAL: Fair given chronicity and extent of BUE impairments  PLAN:  OT FREQUENCY: 2x/week  OT DURATION: 8 weeks  PLANNED INTERVENTIONS: 97168 OT Re-evaluation, 97535 self care/ADL training, 04540 therapeutic exercise, 97530 therapeutic activity,  40981 neuromuscular re-education, 97140 manual therapy, [id number] aquatic therapy, 97035 ultrasound, 19147 paraffin, 97039 fluidotherapy, 97010 moist heat, 97032 electrical stimulation (manual), 97760 Orthotic Initial, 97763 Orthotic/Prosthetic subsequent, passive range of motion, functional mobility training, energy conservation, coping strategies training, patient/family education, and DME and/or AE instructions  RECOMMENDED OTHER SERVICES: N/A for this visit  CONSULTED AND AGREED WITH PLAN OF CARE: Patient  PLAN FOR NEXT SESSION: review resting hand splint don and doff, check positioning - if she brings  Modified thumb spica with wrist support for R and adjust L brace.   Review golf solitaire as needed Tendon glides in splint or glove (improved MCP alignment first)  Paraffin trials as needed Review taping as needed Crochet adaptations  Games for pt and daughter (simple ie) trash dice was good -no timed/competitive tasks  Altamease Asters, OT 04/04/2024, 5:16 PM

## 2024-04-04 NOTE — Progress Notes (Signed)
 CBC and CMP WNL

## 2024-04-06 ENCOUNTER — Ambulatory Visit: Admitting: Occupational Therapy

## 2024-04-06 DIAGNOSIS — M6281 Muscle weakness (generalized): Secondary | ICD-10-CM | POA: Diagnosis not present

## 2024-04-06 DIAGNOSIS — M6249 Contracture of muscle, multiple sites: Secondary | ICD-10-CM

## 2024-04-06 DIAGNOSIS — M0579 Rheumatoid arthritis with rheumatoid factor of multiple sites without organ or systems involvement: Secondary | ICD-10-CM

## 2024-04-06 DIAGNOSIS — R278 Other lack of coordination: Secondary | ICD-10-CM

## 2024-04-06 NOTE — Therapy (Signed)
 OUTPATIENT OCCUPATIONAL THERAPY NEURO TREATMENT AND DISCHARGE  Patient Name: Caitlin Wagner MRN: [id number] DOB:Jun 13, 1966, 58 y.o., female Today's Date: 04/06/2024  OCCUPATIONAL THERAPY DISCHARGE SUMMARY  Visits from Start of Care: 15  Current functional level related to goals / functional outcomes: Patient has met all short and long-term goals to date.   Remaining deficits: Pt remains limited by RA, which affects structural integrity of BUEs, especially at hands and wrists; however, she has resources and splinting options as needed to maintain current level of function as much as possible.    Education / Equipment: Continue with splinting options and joint protection recommendations following OT d/c. Re-consult should additional needs present.    Patient agrees to discharge. Patient goals were met. Patient is being discharged due to meeting the stated rehab goals.Aaron Aas  PCP: Dr. Luster Salters (rheumatologist), Stamey, Lowell Rude, FNP (PCP)  REFERRING PROVIDER: Staci Dykes, MD   END OF SESSION:  OT End of Session - 04/06/24 1637     Visit Number 15    Number of Visits 16    Date for OT Re-Evaluation 04/07/24    Authorization Type UHC UMR/UHC Medicare 2025 Covered 100%    Authorization Time Period VL: [place] Auth Required after 25th visit ([facility])    OT Start Time 1618    OT Stop Time 1725    OT Time Calculation (min) 67 min    Activity Tolerance Patient tolerated treatment well    Behavior During Therapy [name] for tasks assessed/performed         Past Medical History:  Diagnosis Date   Arthritis    RA   ASCUS of cervix with negative high risk HPV 10/2017   High risk HPV infection 03/2012   HSV (herpes simplex virus) anogenital infection 12/2016   LGSIL (low grade squamous intraepithelial dysplasia) 2013/2017   03/2012 positive high risk HPV,  09/2015 with negative high-risk HPV   Rheumatoid arthritis(714.0)    Spinal stenosis    Past Surgical History:  Procedure  Laterality Date   ANTERIOR / POSTERIOR COMBINED FUSION CERVICAL SPINE  01/24/2024   CERVICAL SPINE SURGERY     08/1992 x2 surgeries 1 week apart.   feet reconstruction surgery     bilateral    I & D KNEE WITH POLY EXCHANGE Left 04/11/2018   Procedure: REVISION LEFT TOTAL KNEE- POLY EXCHANGE;  Surgeon: Adonica Hoose, MD;  Location: [facility] OR;  Service: Orthopedics;  Laterality: Left;  Needs RNFA   LUMBAR FUSION  02/2023   TONSILLECTOMY  age 50   TOTAL HIP ARTHROPLASTY Right 12/2020   TOTAL HIP ARTHROPLASTY Left 12/2022   TOTAL KNEE ARTHROPLASTY Left 03/22/2017   Procedure: TOTAL KNEE ARTHROPLASTY w/ Navigation;  Surgeon: Adonica Hoose, MD;  Location: [facility] OR;  Service: Orthopedics;  Laterality: Left;   TOTAL KNEE ARTHROPLASTY Right 05/10/2017   Procedure: TOTAL KNEE ARTHROPLASTY;  Surgeon: Adonica Hoose, MD;  Location: [facility] OR;  Service: Orthopedics;  Laterality: Right;   TOTAL KNEE REVISION Left 07/2020   level 3   Patient Active Problem List   Diagnosis Date Noted   Cervical stenosis of spinal canal 10/01/2023   Lumbar stenosis with neurogenic claudication 10/01/2023   Failed total knee, left, initial encounter (HCC) 04/11/2018   Degenerative arthritis of right knee 05/10/2017   Degenerative joint disease of right knee 05/10/2017   Rheumatoid arthritis (HCC)     ONSET DATE: 02/01/2024 (referral date)   REFERRING DIAG: S/P cervical spinal fusion   Stenosis of cervical spine with myelopathy (  CMD)   THERAPY DIAG:  Muscle weakness (generalized)  Other lack of coordination  Contracture of muscle, multiple sites  Rheumatoid arthritis involving multiple sites with positive rheumatoid factor (HCC)  Rationale for Evaluation and Treatment: Rehabilitation  SUBJECTIVE:   SUBJECTIVE STATEMENT:  Pt states splints feel comfortable with new adjustments and fabrication. No additional concerns or questions.   Pt accompanied by: self   PERTINENT HISTORY: RA, s/p fusion and  laminectomy from C1-T3 and a muscle overlay in the back on 01/24/2024, numerous surgeries related to RA   PRECAUTIONS: Cervical and Fall (RA - will need lumbar surgery and RA ulnar drift in UE digits)   WEIGHT BEARING RESTRICTIONS: Yes no lifting over 10 lb   PAIN:  Are you having pain? No and Yes: NPRS scale: 7/10 Pain location: low back  FALLS: Has patient fallen in last 6 months? Yes. 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  HAND DOMINANCE: Right  ADLs:  Eating: leans into UE to feed self Grooming: husband puts hair up and brushes hair (pt afraid she will hit her incision with a brush); generally mod I UB Dressing: max to total A LB Dressing: max A Toileting: mod I Bathing: mod A Tub Shower transfers: min A  IADLs: Shopping: online - husband picks up Light housekeeping: has paid cleaner that comes in 1 x month Meal Prep: dependent Community mobility: dependent Medication management: mod I Handwriting: no recent changes  MOBILITY STATUS: Independent with SPC  ACTIVITY TOLERANCE: Activity tolerance: fair  FUNCTIONAL OUTCOME MEASURES: 02/17/24: PSFS = 2.3   03/30/2024:  PSFS = 8.7 total  score   Total score = sum of the activity scores/number of activities Minimum detectable change (90%CI) for average score = 2 points Minimum detectable change (90%CI) for single activity score = 3 points  UPPER EXTREMITY ROM:     AROM Right (eval) Left (eval) Right (5/29)  Shoulder flexion 45* WFL 120*  Shoulder abduction 45* WFL 45*  Elbow flexion [facility] WFL   Elbow extension [facility]   Wrist flexion [facility] WFL   Wrist extension [facility] WFL   Forearm pronation WFL WFL   Forearm supination 60* [facility] WFL  Digit Composite Flexion [facility] WFL with exception to thumb (goes to middle of palm)    Digit Composite Extension Lacks extension at MCP (mild hyperextension at 4th and 5th PIPs) Lacks extension at MCP (hyperextension at 4th and 5th PIPs)   Digit Opposition To 2nd and 3rd digits only To 2nd digit only   (Blank rows = not tested)  UPPER EXTREMITY MMT:     BUE: BFL  HAND FUNCTION: 02/17/24 Grip strength: Right: 12.7, 15.6  lbs; Left: 11.2, 13.0 lbs Average Right: 14.2 lbs Left 12.1 lbs  COORDINATION: 02/17/24 9 Hole  Peg test: Right: 29.06 sec; Left: 28.97 sec  SENSATION: WFL  EDEMA: Edema with RA, better controlled with current medications  MUSCLE TONE: BUE - slight hypotonicity in hands  COGNITION: Overall cognitive status: Within functional limits for tasks assessed  VISION: Subjective report: no changes Baseline vision: Wears glasses all the time  VISION ASSESSMENT: WFL  PERCEPTION: Not tested  PRAXIS: Not tested  OBSERVATIONS: Pt appears well-kept. Uses SPC for slow, altered gait though no LOB. Ulnar drifting of B hands with limited MCP extension.                                                                                                                    TODAY'S TREATMENT :    - Self-care/home management completed for duration as noted below including:  OT reviewed splint wearing schedules for fabricated and prefab options.   Reviewed recommendation to apply  heating pad to forearm/over resting hand splints for 10 min prior to removal to help reduce tendon stiffness/pain, which she has been experiencing upon removal.   Orthotic Initial fit and casting:  Using wide (black) Orficast, OT fabricated R shortened thumb spica splint with elongated palmar wrist and distal forearm support. OT assured proper fit and pt reported good comfort with wear.  Orthotic Prosthetic Management: OT adjusted B prefab resting hand splints to allow PIP and DIPs to be inferior to MCPs to prevent joint grinding at MCPs.   OT completed adjustment of fabricated L shortened thumb spica splint with elongated palmar wrist and distal forearm support. Pt returned demonstration of donning/adjustment and verified comfort with fit.  PATIENT EDUCATION: Education details: Splint options/fabrication; OT d/c Person educated: Patient and spouse for end of session Education method: Explanation, Demonstration, and Verbal cues Education comprehension: verbalized understanding, returned demonstration, and verbal cues required  HOME EXERCISE PROGRAM: 02/17/24: Sleep positions/Return to driving  02/20/980: dowel HEP  GOALS:  SHORT TERM GOALS: MET 2/2  LONG TERM GOALS: Target date: 04/07/2024    Patient will demonstrate updated BUE HEP with visual handouts only for proper execution. Baseline: New to outpt OT Goal status: MET  2.  Patient will have adequate hand strength and coordination to use modified techniques and adaptive equipment for hobbies, cooking and self care AND no loss of strength or coordination via standardized tests.  Baseline: Grip Right: 14.2 lbs Left 12.1 lbs AND 9 Hole Peg test: Right: 29.06 sec; Left: 28.97 sec Goal status: MET  3.  Patient will report at least two-point increase in average PSFS score or at least three-point increase in a single activity score indicating functionally significant improvement given minimum detectable change. Baseline: 2.3 total score (See  above for individual activity scores.  03/30/2024: 8.7 total score Goal status: MET  ASSESSMENT:  CLINICAL IMPRESSION: Pt has met all goals this date and has strategies as well as splints to help manage chronic RA and BUE deformities. Patient is appropriate for discharge and no longer demonstrates medical necessity for continued skilled occupational therapy services.  PLAN:  OT D/C Completed  CONSULTED AND AGREED WITH PLAN OF CARE: Patient   Altamease Asters, OT 04/06/2024, 5:33 PM

## 2024-04-13 ENCOUNTER — Telehealth: Payer: Self-pay

## 2024-04-13 DIAGNOSIS — M059 Rheumatoid arthritis with rheumatoid factor, unspecified: Secondary | ICD-10-CM

## 2024-04-13 DIAGNOSIS — Z79899 Other long term (current) drug therapy: Secondary | ICD-10-CM

## 2024-04-13 NOTE — Telephone Encounter (Signed)
 Patient contacted the office stating she received an email from her insurance ([facility]) offering speciality pharmacies for her Rinvoq  said it starts on July 1st . Those pharmacies are [facility] or [facility]. Patient would like to know if this is legit an if so can a prescription can be sent to preferably [facility]. Please advise.

## 2024-04-14 MED ORDER — RINVOQ 15 MG PO TB24: 1.0000 | ORAL_TABLET | Freq: Every day | ORAL | 0 refills | Status: DC

## 2024-04-14 NOTE — Addendum Note (Signed)
 Addended by: DAYNE SHERRY RAMAN on: 04/14/2024 04:19 PM   Modules accepted: Orders

## 2024-04-14 NOTE — Telephone Encounter (Signed)
 Unable to run PA - showing patient not found  Rx sent to [facility] (phone [phone]).  Will await follow-up from pharmacy  Sherry Pennant, PharmD, MPH, BCPS, CPP Clinical Pharmacist (Rheumatology and Pulmonology)

## 2024-04-19 DIAGNOSIS — M542 Cervicalgia: Secondary | ICD-10-CM | POA: Diagnosis not present

## 2024-04-19 DIAGNOSIS — M5031 Other cervical disc degeneration,  high cervical region: Secondary | ICD-10-CM | POA: Diagnosis not present

## 2024-04-19 DIAGNOSIS — Z981 Arthrodesis status: Secondary | ICD-10-CM | POA: Diagnosis not present

## 2024-04-19 DIAGNOSIS — M545 Low back pain, unspecified: Secondary | ICD-10-CM | POA: Diagnosis not present

## 2024-06-09 DIAGNOSIS — M5136 Other intervertebral disc degeneration, lumbar region with discogenic back pain only: Secondary | ICD-10-CM | POA: Diagnosis not present

## 2024-06-09 DIAGNOSIS — M47816 Spondylosis without myelopathy or radiculopathy, lumbar region: Secondary | ICD-10-CM | POA: Diagnosis not present

## 2024-06-09 DIAGNOSIS — Z981 Arthrodesis status: Secondary | ICD-10-CM | POA: Diagnosis not present

## 2024-06-09 DIAGNOSIS — M5126 Other intervertebral disc displacement, lumbar region: Secondary | ICD-10-CM | POA: Diagnosis not present

## 2024-06-14 ENCOUNTER — Telehealth: Payer: Self-pay

## 2024-06-14 NOTE — Telephone Encounter (Signed)
 Opened in error

## 2024-06-20 DIAGNOSIS — M545 Low back pain, unspecified: Secondary | ICD-10-CM | POA: Diagnosis not present

## 2024-06-20 DIAGNOSIS — Z981 Arthrodesis status: Secondary | ICD-10-CM | POA: Diagnosis not present

## 2024-06-20 DIAGNOSIS — M4316 Spondylolisthesis, lumbar region: Secondary | ICD-10-CM | POA: Diagnosis not present

## 2024-06-21 ENCOUNTER — Other Ambulatory Visit (HOSPITAL_COMMUNITY): Payer: Self-pay

## 2024-06-21 ENCOUNTER — Telehealth: Payer: Self-pay | Admitting: Pharmacist

## 2024-06-21 DIAGNOSIS — E781 Pure hyperglyceridemia: Secondary | ICD-10-CM

## 2024-06-21 DIAGNOSIS — Z79899 Other long term (current) drug therapy: Secondary | ICD-10-CM

## 2024-06-21 DIAGNOSIS — M059 Rheumatoid arthritis with rheumatoid factor, unspecified: Secondary | ICD-10-CM

## 2024-06-21 NOTE — Telephone Encounter (Signed)
 Received fax from [facility] requesting rx per patient request. Must fill with [facility] [phone] [facility])  They transferred the Rinvoq  rx back to the correct [facility] pharmacy that manages [facility] prescriptions on 04/17/2024  Per rep at correct pharmacy, Rinvoq  was filled last week with no copay issues.  Pharmacy Phone: [phone]  Sherry Pennant, PharmD, MPH, BCPS, CPP Clinical Pharmacist (Rheumatology and Pulmonology)

## 2024-06-22 NOTE — Telephone Encounter (Signed)
 Patient contacted the office and states she had talked with her insurance and now she needs to fill through an international pharmacy called [facility]. Patient states they need a prescription sent there. Patient states she was advised her copay card ran out and now this is her only option. Please advise.

## 2024-06-22 NOTE — Telephone Encounter (Signed)
 Biomedical scientist. Per rep, patient has depleted copay card funds. They are actively working on an override with no turnaround time unfortunately.   Reached out to patient for clarification She states that the pharmacy did an override last month, but this is the last fill that they'd be able to get for the patient to fill through [facility].  Called [facility] to confirm [phone]) if this is legit pharmacy - they are headquartered in [place] with their [place]  location in [place], [place].  Rx would be coming from one of four countries: [place], [place], [place], and [place]  Employer opted into a cost-savings program that allows patients to receive medication at low cost or free.  [facility] must have prescriptions faxed (cannot escribe) Fax: [phone]  Rx for Rinvoq  MUST be faxed to [facility]. Patient is due for updated CBC/CMP. TB gold is due Nov 2025.  D/w Dr. Dolphus - will revisit conversation  Sherry Pennant, PharmD, MPH, BCPS, CPP Clinical Pharmacist (Rheumatology and Pulmonology)

## 2024-06-26 DIAGNOSIS — M4316 Spondylolisthesis, lumbar region: Secondary | ICD-10-CM | POA: Diagnosis not present

## 2024-06-26 DIAGNOSIS — Z981 Arthrodesis status: Secondary | ICD-10-CM | POA: Diagnosis not present

## 2024-06-26 NOTE — Telephone Encounter (Signed)
 Left VM with [facility] case manager @ [phone]. Requested return call

## 2024-06-27 ENCOUNTER — Other Ambulatory Visit: Payer: Self-pay | Admitting: *Deleted

## 2024-06-27 DIAGNOSIS — Z79899 Other long term (current) drug therapy: Secondary | ICD-10-CM

## 2024-06-27 DIAGNOSIS — M059 Rheumatoid arthritis with rheumatoid factor, unspecified: Secondary | ICD-10-CM

## 2024-06-27 DIAGNOSIS — E781 Pure hyperglyceridemia: Secondary | ICD-10-CM

## 2024-06-27 LAB — CBC WITH DIFFERENTIAL/PLATELET
Absolute Lymphocytes: 2734 {cells}/uL (ref 850–3900)
Absolute Monocytes: 353 {cells}/uL (ref 200–950)
Basophils Absolute: 19 {cells}/uL (ref 0–200)
Basophils Relative: 0.3 %
Eosinophils Absolute: 0 {cells}/uL — ABNORMAL LOW (ref 15–500)
Eosinophils Relative: 0 %
HCT: 41.5 % (ref 35.0–45.0)
Hemoglobin: 14.3 g/dL (ref 11.7–15.5)
MCH: 34 pg — ABNORMAL HIGH (ref 27.0–33.0)
MCHC: 34.5 g/dL (ref 32.0–36.0)
MCV: 98.6 fL (ref 80.0–100.0)
MPV: 10 fL (ref 7.5–12.5)
Monocytes Relative: 5.6 %
Neutro Abs: 3194 {cells}/uL (ref 1500–7800)
Neutrophils Relative %: 50.7 %
Platelets: 282 Thousand/uL (ref 140–400)
RBC: 4.21 Million/uL (ref 3.80–5.10)
RDW: 13.8 % (ref 11.0–15.0)
Total Lymphocyte: 43.4 %
WBC: 6.3 Thousand/uL (ref 3.8–10.8)

## 2024-06-27 LAB — COMPREHENSIVE METABOLIC PANEL WITH GFR
AG Ratio: 1.8 (calc) (ref 1.0–2.5)
ALT: 16 U/L (ref 6–29)
AST: 18 U/L (ref 10–35)
Albumin: 4.7 g/dL (ref 3.6–5.1)
Alkaline phosphatase (APISO): 68 U/L (ref 37–153)
BUN: 14 mg/dL (ref 7–25)
CO2: 27 mmol/L (ref 20–32)
Calcium: 10.3 mg/dL (ref 8.6–10.4)
Chloride: 105 mmol/L (ref 98–110)
Creat: 0.58 mg/dL (ref 0.50–1.03)
Globulin: 2.6 g/dL (ref 1.9–3.7)
Glucose, Bld: 105 mg/dL — ABNORMAL HIGH (ref 65–99)
Potassium: 4.6 mmol/L (ref 3.5–5.3)
Sodium: 141 mmol/L (ref 135–146)
Total Bilirubin: 0.3 mg/dL (ref 0.2–1.2)
Total Protein: 7.3 g/dL (ref 6.1–8.1)
eGFR: 106 mL/min/1.73m2 (ref >=60)

## 2024-06-27 LAB — LIPID PANEL
Cholesterol: 168 mg/dL (ref <200)
HDL: 57 mg/dL (ref >=50)
LDL Cholesterol (Calc): 88 mg/dL
Non-HDL Cholesterol (Calc): 111 mg/dL (ref <130)
Total CHOL/HDL Ratio: 2.9 (calc) (ref <5.0)
Triglycerides: 132 mg/dL (ref <150)

## 2024-06-27 MED ORDER — RINVOQ 15 MG PO TB24
1.0000 | ORAL_TABLET | Freq: Every day | ORAL | 0 refills | Status: DC
Start: 2024-06-27 — End: 2024-07-03

## 2024-06-27 NOTE — Telephone Encounter (Signed)
 Patient contacted the office to inquire if her Rinvoq  has been sent in yet because she is going to run out. Advised the patient the pharmacy team is working with Dr. Dolphus and waiting for an answer. Advised the patient that the pharmacy team tried to contact [facility] and are waiting on a call back as well. Advised the patient she is also due to update labs. Will placed orders for future labs for CBC, CMP, and Lipid Panel. Patient states she does have surgery planned for October and her doctor will be faxing over a clearance form. Please advise.

## 2024-06-27 NOTE — Telephone Encounter (Signed)
 Patient can pick up Rinvoq  sample when she comes for labs  Sherry Pennant, PharmD, MPH, BCPS, CPP Clinical Pharmacist [facility])

## 2024-06-27 NOTE — Telephone Encounter (Signed)
 Spoke with rep at [facility]. He states that prescriptions are absolutely not imported from international countries. Drugs rae imported but prescriptions are filled in the [place] / Provided pharmacy information below: [facility] [address] [place], [place] [address] Phone: [phone]  Nathanel manager states the prescription is filled in [place] and imported into country. She states they manage the prescriptions but they do not fill prescriptions. Of note, simple Google search indicates no real pharmacy building in street view. She recommended I call [facility] for details. I called [facility] and unable to reach person and no automated system. Left VM requesting return call.  [facility] phone: [phone]  D/w Dr. Dolphus - we can provide samples for patient until we can figure out another solution. We will need to try to apply for patient assistance program  Sherry Pennant, PharmD, MPH, BCPS, CPP Clinical Pharmacist [facility])

## 2024-06-27 NOTE — Telephone Encounter (Signed)
 Please do not send rx to [facility]. Her insurance is requesting she fill Rinvoq  with an Psychologist, clinical. I have provided her with 3 months of samples. Will need to figure out if patient is eligible for patient assistance program.   I have highly encouraged her to reach out to her insurance plan to file a complaint regarding this scheme.  Sherry Pennant, PharmD, MPH, BCPS, CPP Clinical Pharmacist [facility])

## 2024-06-27 NOTE — Telephone Encounter (Signed)
 Last Fill: 04/14/2024  Labs: 04/03/2024 CBC and CMP WNL (updated labs 06/27/2024)  TB Gold: 08/30/2023  Neg   Next Visit: 09/05/2024  Last Visit: 04/03/2024  IK:Myzlfjunpi arthritis involving multiple sites with positive rheumatoid factor   Current Dose per office note 04/03/2024: Rinvoq  15 mg 1 tablet daily   Okay to refill Rinvoq ?

## 2024-06-27 NOTE — Addendum Note (Signed)
 Addended by: DAYNE SHERRY RAMAN on: 06/27/2024 03:50 PM   Modules accepted: Orders

## 2024-06-27 NOTE — Addendum Note (Signed)
 Addended by: Evie Crumpler P on: 06/27/2024 11:19 AM   Modules accepted: Orders

## 2024-06-27 NOTE — Telephone Encounter (Addendum)
 Medication Samples have been provided to the patient. She had labs completed today.  Drug name: Rinvoq  15mg  tabs Qty: 84 tabs (6 boxes x 14 tablets) LOT: multiple lots as noted below  Dosing instructions: Take one tablet by mouth once daily  #2 box - Lot # [id number] - Exp 09/2025 #1 box - Lot # [id number] - Exp 08/2025 #1 box - Lot # [id number] - Exp 09/2025 #1 box - Lot # [id number] - Exp 12/25/2024 #1 box - Lot #[id number] - Exp 09/04/2025  I reviewed in detail regarding the answers we have received regarding insurance and [facility], [employer], and [facility]. Advised that we do not feel comfortable sending a prescription to an international pharmacy.  Sherry Pennant, PharmD, MPH, BCPS, CPP Clinical Pharmacist [facility])

## 2024-06-28 ENCOUNTER — Ambulatory Visit: Payer: Self-pay | Admitting: Physician Assistant

## 2024-06-28 ENCOUNTER — Telehealth: Payer: Self-pay

## 2024-06-28 NOTE — Telephone Encounter (Signed)
 Received surgical clearance request from [facility].   Procedure: Stage 1: L1-2, L2-3, L3-4 lateral ATP fusion followed by stage 2: posterior L1-L5 fusion revision, possible extension additional levels as indicated.   Patient is on Rinvoq . Medication recommendations were made in clearance letter, Dr. Dolphus reviewed and letter was faxed. I advised patient and advised her of recommendations. Patient verbalized understanding.

## 2024-06-28 NOTE — Progress Notes (Signed)
 MCH is borderline elevated. Absolute eosinophils are low. Rest of CBC WNL. We will continue to monitor.   CMP WNL Lipid panel WNL

## 2024-07-03 ENCOUNTER — Other Ambulatory Visit: Payer: Self-pay | Admitting: Physician Assistant

## 2024-07-03 DIAGNOSIS — M059 Rheumatoid arthritis with rheumatoid factor, unspecified: Secondary | ICD-10-CM

## 2024-07-03 DIAGNOSIS — Z79899 Other long term (current) drug therapy: Secondary | ICD-10-CM

## 2024-07-03 NOTE — Telephone Encounter (Signed)
 Last Fill: Patient got samples of Rinvoq  on 06/27/2024 due to filling issues with her pharmacy.   Labs: 06/27/2024 MCH is borderline elevated. Absolute eosinophils are low. Rest of CBC WNL. We will continue to monitor.   CMP WNL Lipid panel WNL  TB Gold: 08/30/2023 TB Gold negative   Next Visit: 09/05/2024  Last Visit: 04/03/2024  IK:Myzlfjunpi arthritis involving multiple sites with positive rheumatoid factor (HCC)   Current Dose per office note 04/03/2024: Rinvoq  15 mg 1 tablet daily   Per Devki, okay to work up on month and send to provider.   Okay to refill Rinvoq ?

## 2024-07-26 DIAGNOSIS — G8929 Other chronic pain: Secondary | ICD-10-CM | POA: Diagnosis not present

## 2024-07-26 DIAGNOSIS — Z981 Arthrodesis status: Secondary | ICD-10-CM | POA: Diagnosis not present

## 2024-07-26 DIAGNOSIS — M5440 Lumbago with sciatica, unspecified side: Secondary | ICD-10-CM | POA: Diagnosis not present

## 2024-08-22 NOTE — Progress Notes (Deleted)
 Office Visit Note  Patient: Caitlin Wagner             Date of Birth: 1966-03-06           MRN: [id number]             PCP: Crecencio Chiquita POUR, FNP Referring: Crecencio Chiquita POUR, FNP Visit Date: 09/05/2024 Occupation: Data Unavailable  Subjective:  No chief complaint on file.   History of Present Illness: Caitlin Wagner is a 58 y.o. female ***     Activities of Daily Living:  Patient reports morning stiffness for *** {minute/hour:19697}.   Patient {ACTIONS;DENIES/REPORTS:21021675::Denies} nocturnal pain.  Difficulty dressing/grooming: {ACTIONS;DENIES/REPORTS:21021675::Denies} Difficulty climbing stairs: {ACTIONS;DENIES/REPORTS:21021675::Denies} Difficulty getting out of chair: {ACTIONS;DENIES/REPORTS:21021675::Denies} Difficulty using hands for taps, buttons, cutlery, and/or writing: {ACTIONS;DENIES/REPORTS:21021675::Denies}  No Rheumatology ROS completed.   PMFS History:  Patient Active Problem List   Diagnosis Date Noted   Cervical stenosis of spinal canal 10/01/2023   Lumbar stenosis with neurogenic claudication 10/01/2023   Failed total knee, left, initial encounter 04/11/2018   Degenerative arthritis of right knee 05/10/2017   Degenerative joint disease of right knee 05/10/2017   Rheumatoid arthritis (HCC)     Past Medical History:  Diagnosis Date   Arthritis    RA   ASCUS of cervix with negative high risk HPV 10/2017   High risk HPV infection 03/2012   HSV (herpes simplex virus) anogenital infection 12/2016   LGSIL (low grade squamous intraepithelial dysplasia) 2013/2017   03/2012 positive high risk HPV,  09/2015 with negative high-risk HPV   Rheumatoid arthritis(714.0)    Spinal stenosis     Family History  Problem Relation Age of Onset   Heart disease Father        heart attack at age 35   Diverticulitis Father    Cancer Father        Bladder    Healthy Brother    Rheum arthritis Maternal Aunt    Heart failure Maternal Grandmother     Cancer Maternal Grandfather        ? type   Diabetes Paternal Grandmother    Heart disease Paternal Grandfather    Healthy Son    Healthy Daughter    Autism Daughter    Anxiety disorder Daughter    ADD / ADHD Daughter    Past Surgical History:  Procedure Laterality Date   ANTERIOR / POSTERIOR COMBINED FUSION CERVICAL SPINE  01/24/2024   CERVICAL SPINE SURGERY     08/1992 x2 surgeries 1 week apart.   feet reconstruction surgery     bilateral    I & D KNEE WITH POLY EXCHANGE Left 04/11/2018   Procedure: REVISION LEFT TOTAL KNEE- POLY EXCHANGE;  Surgeon: Fidel Rogue, MD;  Location: [facility] OR;  Service: Orthopedics;  Laterality: Left;  Needs RNFA   LUMBAR FUSION  02/2023   TONSILLECTOMY  age 74   TOTAL HIP ARTHROPLASTY Right 12/2020   TOTAL HIP ARTHROPLASTY Left 12/2022   TOTAL KNEE ARTHROPLASTY Left 03/22/2017   Procedure: TOTAL KNEE ARTHROPLASTY w/ Navigation;  Surgeon: Fidel Rogue, MD;  Location: [facility] OR;  Service: Orthopedics;  Laterality: Left;   TOTAL KNEE ARTHROPLASTY Right 05/10/2017   Procedure: TOTAL KNEE ARTHROPLASTY;  Surgeon: Fidel Rogue, MD;  Location: [facility] OR;  Service: Orthopedics;  Laterality: Right;   TOTAL KNEE REVISION Left 07/2020   level 3   Social History   Tobacco Use   Smoking status: Former    Current packs/day: 0.00    Types: Cigarettes  Quit date: 03/12/2014    Years since quitting: 10.4    Passive exposure: Past   Smokeless tobacco: Never  Vaping Use   Vaping status: Former  Substance Use Topics   Alcohol use: No    Alcohol/week: 0.0 standard drinks of alcohol   Drug use: No   Social History   Social History Narrative   ** Merged History Encounter **         Immunization History  Administered Date(s) Administered   Influenza,inj,Quad PF,6+ Mos 08/01/2015   PFIZER(Purple Top)SARS-COV-2 Vaccination 01/10/2020, 01/27/2020, 06/06/2020     Objective: Vital Signs: LMP 04/06/2018    Physical Exam   Musculoskeletal Exam:  ***  CDAI Exam: CDAI Score: -- Patient Global: --; Provider Global: -- Swollen: --; Tender: -- Joint Exam 09/05/2024   No joint exam has been documented for this visit   There is currently no information documented on the homunculus. Go to the Rheumatology activity and complete the homunculus joint exam.  Investigation: No additional findings.  Imaging: No results found.  Recent Labs: Lab Results  Component Value Date   WBC 6.3 06/27/2024   HGB 14.3 06/27/2024   PLT 282 06/27/2024   NA 141 06/27/2024   K 4.6 06/27/2024   CL 105 06/27/2024   CO2 27 06/27/2024   GLUCOSE 105 (H) 06/27/2024   BUN 14 06/27/2024   CREATININE 0.58 06/27/2024   BILITOT 0.3 06/27/2024   ALKPHOS 61 04/29/2017   AST 18 06/27/2024   ALT 16 06/27/2024   PROT 7.3 06/27/2024   ALBUMIN 3.5 04/29/2017   CALCIUM 10.3 06/27/2024   GFRAA >60 04/12/2018   QFTBGOLDPLUS NEGATIVE 08/30/2023    Speciality Comments: Methotrexate: Subcutaneous-inadequate response Leflunomide-diarrhea Enbrel, Humira, Orencia, Cimzia inadequate response, Remicade -anaphylaxis, Xeljanz-inadequate response Rinvoq -09/03/23  Procedures:  No procedures performed Allergies: Fentanyl , Remicade [infliximab], Arava [leflunomide], Cymbalta [duloxetine hcl], Dilaudid  [hydromorphone  hcl], Surgical lubricant, and Codeine   Assessment / Plan:     Visit Diagnoses: No diagnosis found.  Orders: No orders of the defined types were placed in this encounter.  No orders of the defined types were placed in this encounter.   Face-to-face time spent with patient was *** minutes. Greater than 50% of time was spent in counseling and coordination of care.  Follow-Up Instructions: No follow-ups on file.   Daved JAYSON Gavel, CMA  Note - This record has been created using Animal nutritionist.  Chart creation errors have been sought, but may not always  have been located. Such creation errors do not reflect on  the standard of medical care.

## 2024-08-23 ENCOUNTER — Other Ambulatory Visit: Payer: Self-pay | Admitting: Physician Assistant

## 2024-08-23 DIAGNOSIS — Z79899 Other long term (current) drug therapy: Secondary | ICD-10-CM

## 2024-08-23 DIAGNOSIS — M059 Rheumatoid arthritis with rheumatoid factor, unspecified: Secondary | ICD-10-CM

## 2024-08-23 NOTE — Telephone Encounter (Signed)
 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

## 2024-09-05 ENCOUNTER — Ambulatory Visit: Admitting: Rheumatology

## 2024-09-05 DIAGNOSIS — M180 Bilateral primary osteoarthritis of first carpometacarpal joints: Secondary | ICD-10-CM

## 2024-09-05 DIAGNOSIS — M0579 Rheumatoid arthritis with rheumatoid factor of multiple sites without organ or systems involvement: Secondary | ICD-10-CM

## 2024-09-05 DIAGNOSIS — Z96643 Presence of artificial hip joint, bilateral: Secondary | ICD-10-CM

## 2024-09-05 DIAGNOSIS — E559 Vitamin D deficiency, unspecified: Secondary | ICD-10-CM

## 2024-09-05 DIAGNOSIS — Z87891 Personal history of nicotine dependence: Secondary | ICD-10-CM

## 2024-09-05 DIAGNOSIS — M79671 Pain in right foot: Secondary | ICD-10-CM

## 2024-09-05 DIAGNOSIS — Z8781 Personal history of (healed) traumatic fracture: Secondary | ICD-10-CM

## 2024-09-05 DIAGNOSIS — Z96653 Presence of artificial knee joint, bilateral: Secondary | ICD-10-CM

## 2024-09-05 DIAGNOSIS — E781 Pure hyperglyceridemia: Secondary | ICD-10-CM

## 2024-09-05 DIAGNOSIS — Z8619 Personal history of other infectious and parasitic diseases: Secondary | ICD-10-CM

## 2024-09-05 DIAGNOSIS — M4802 Spinal stenosis, cervical region: Secondary | ICD-10-CM

## 2024-09-05 DIAGNOSIS — M48062 Spinal stenosis, lumbar region with neurogenic claudication: Secondary | ICD-10-CM

## 2024-09-05 DIAGNOSIS — Z79899 Other long term (current) drug therapy: Secondary | ICD-10-CM

## 2024-09-05 DIAGNOSIS — Z8261 Family history of arthritis: Secondary | ICD-10-CM

## 2024-09-05 DIAGNOSIS — G8929 Other chronic pain: Secondary | ICD-10-CM

## 2024-10-25 ENCOUNTER — Other Ambulatory Visit: Payer: Self-pay | Admitting: Physician Assistant

## 2024-10-25 DIAGNOSIS — M059 Rheumatoid arthritis with rheumatoid factor, unspecified: Secondary | ICD-10-CM

## 2024-10-25 DIAGNOSIS — Z79899 Other long term (current) drug therapy: Secondary | ICD-10-CM

## 2024-10-25 NOTE — Telephone Encounter (Signed)
 Contacted the patient regarding her TB Gold test. Patient states she was in the Hospital since October due to surgery complications. Patient states she is now in a rehab facility and she does not know yet when she will be out. Patient states she has no way to get the test drawn currently. Patient states she currently still has a couple bottles of medication left from not being able to take it previously and should not need a refill yet. Patient states she will call the office when she is out of rehab and schedule an appointment at that time as well.

## 2024-11-01 ENCOUNTER — Telehealth: Payer: Self-pay | Admitting: Pharmacist

## 2024-11-01 NOTE — Telephone Encounter (Signed)
 Submitted a Prior Authorization renewal request to [employer] for RINVOQ  via CoverMyMeds. Will update once we receive a response.  Key: [id number]

## 2024-11-06 NOTE — Telephone Encounter (Signed)
 Received notification from [employer] regarding a prior authorization for RINVOQ . Authorization has been APPROVED from 11/01/2024 to 10/18/2025. Approval letter sent to scan center.  Authorization # [id number]  Sherry Pennant, PharmD, MPH, BCPS, CPP Clinical Pharmacist

## 2024-11-14 ENCOUNTER — Telehealth: Payer: Self-pay | Admitting: Rheumatology

## 2024-11-14 NOTE — Telephone Encounter (Signed)
 Called patient and advised she can have the TB Gold drawn at her PCP's office or maybe even home health services could draw the lab. Patient states she has to see her PCP anyways for medication refills. Advised patient to ask if they will draw the TB Gold and we can send an order. Also advised patient we will need clearance from her surgeon prior to restarting rinvoq  as she currently has a wound vac. Patient verbalized understanding and will contact our office about the lab order.

## 2024-11-14 NOTE — Telephone Encounter (Signed)
 Patient called stating she received a call from our office (couldn't remember the person's name she spoke with) that her TB gold lab test is needed in order to refill her Rinvoq  medication.  Patient states she was in the hospital for the past 3 months and is now confined to a wheelchair living in an apartment in [facility] due to complications from her surgery.  Patient states she doesn't know when she can get in the office for the labwork due to not being able to drive and is very upset she won't be able to get her refill.  Patient requested a return call.

## 2024-11-15 ENCOUNTER — Other Ambulatory Visit: Payer: Self-pay | Admitting: *Deleted

## 2024-11-15 ENCOUNTER — Telehealth: Payer: Self-pay | Admitting: *Deleted

## 2024-11-15 DIAGNOSIS — Z9225 Personal history of immunosupression therapy: Secondary | ICD-10-CM

## 2024-11-15 DIAGNOSIS — Z111 Encounter for screening for respiratory tuberculosis: Secondary | ICD-10-CM

## 2024-11-15 NOTE — Telephone Encounter (Signed)
 Patient contacted the office and left message requesting lab order for TB Gold to be faxed to PCP at [phone]. Patient has an appointment with them on 11/17/2024 at 10:15 am.   Patient advised order for TB Gold has been faxed to PCP.

## 2024-11-23 ENCOUNTER — Telehealth: Payer: Self-pay

## 2024-11-23 NOTE — Telephone Encounter (Signed)
 Labs received from: [place] [facility] on: 11/17/2024  Reviewed by: Waddell Craze, PA-C  Labs drawn: TB Gold  Results: Negative
